# Patient Record
Sex: Male | Born: 1945 | Race: White | Hispanic: No | Marital: Single | Smoking: Never smoker
Health system: Southern US, Community
[De-identification: ages and names within clinical notes are randomized; demographics above are authoritative.]

## PROBLEM LIST (undated history)

## (undated) DIAGNOSIS — I2510 Atherosclerotic heart disease of native coronary artery without angina pectoris: Secondary | ICD-10-CM

## (undated) DIAGNOSIS — R42 Dizziness and giddiness: Secondary | ICD-10-CM

## (undated) DIAGNOSIS — I420 Dilated cardiomyopathy: Secondary | ICD-10-CM

## (undated) DIAGNOSIS — G4733 Obstructive sleep apnea (adult) (pediatric): Secondary | ICD-10-CM

## (undated) DIAGNOSIS — R238 Other skin changes: Secondary | ICD-10-CM

## (undated) DIAGNOSIS — R609 Edema, unspecified: Secondary | ICD-10-CM

## (undated) DIAGNOSIS — N189 Chronic kidney disease, unspecified: Secondary | ICD-10-CM

## (undated) DIAGNOSIS — J189 Pneumonia, unspecified organism: Secondary | ICD-10-CM

## (undated) DIAGNOSIS — I472 Ventricular tachycardia, unspecified: Secondary | ICD-10-CM

## (undated) DIAGNOSIS — E039 Hypothyroidism, unspecified: Secondary | ICD-10-CM

## (undated) DIAGNOSIS — R0602 Shortness of breath: Secondary | ICD-10-CM

## (undated) DIAGNOSIS — M1712 Unilateral primary osteoarthritis, left knee: Secondary | ICD-10-CM

## (undated) DIAGNOSIS — I4891 Unspecified atrial fibrillation: Secondary | ICD-10-CM

## (undated) DIAGNOSIS — R233 Spontaneous ecchymoses: Secondary | ICD-10-CM

## (undated) DIAGNOSIS — I255 Ischemic cardiomyopathy: Secondary | ICD-10-CM

## (undated) DIAGNOSIS — H269 Unspecified cataract: Secondary | ICD-10-CM

## (undated) DIAGNOSIS — R3915 Urgency of urination: Secondary | ICD-10-CM

## (undated) DIAGNOSIS — K5900 Constipation, unspecified: Secondary | ICD-10-CM

## (undated) DIAGNOSIS — I219 Acute myocardial infarction, unspecified: Secondary | ICD-10-CM

## (undated) DIAGNOSIS — I10 Essential (primary) hypertension: Secondary | ICD-10-CM

## (undated) DIAGNOSIS — Q600 Renal agenesis, unilateral: Secondary | ICD-10-CM

## (undated) DIAGNOSIS — M2550 Pain in unspecified joint: Secondary | ICD-10-CM

## (undated) DIAGNOSIS — R350 Frequency of micturition: Secondary | ICD-10-CM

## (undated) DIAGNOSIS — M2540 Effusion, unspecified joint: Secondary | ICD-10-CM

## (undated) DIAGNOSIS — R600 Localized edema: Secondary | ICD-10-CM

## (undated) DIAGNOSIS — F329 Major depressive disorder, single episode, unspecified: Secondary | ICD-10-CM

## (undated) DIAGNOSIS — F32A Depression, unspecified: Secondary | ICD-10-CM

## (undated) DIAGNOSIS — K219 Gastro-esophageal reflux disease without esophagitis: Secondary | ICD-10-CM

## (undated) DIAGNOSIS — J302 Other seasonal allergic rhinitis: Secondary | ICD-10-CM

## (undated) DIAGNOSIS — R32 Unspecified urinary incontinence: Secondary | ICD-10-CM

## (undated) DIAGNOSIS — Z8639 Personal history of other endocrine, nutritional and metabolic disease: Secondary | ICD-10-CM

## (undated) DIAGNOSIS — E785 Hyperlipidemia, unspecified: Secondary | ICD-10-CM

## (undated) DIAGNOSIS — Z950 Presence of cardiac pacemaker: Secondary | ICD-10-CM

## (undated) DIAGNOSIS — I509 Heart failure, unspecified: Secondary | ICD-10-CM

## (undated) HISTORY — DX: Essential (primary) hypertension: I10

## (undated) HISTORY — DX: Pneumonia, unspecified organism: J18.9

## (undated) HISTORY — PX: MULTIPLE TOOTH EXTRACTIONS: SHX2053

## (undated) HISTORY — DX: Major depressive disorder, single episode, unspecified: F32.9

## (undated) HISTORY — PX: COLONOSCOPY: SHX174

## (undated) HISTORY — DX: Ventricular tachycardia: I47.2

## (undated) HISTORY — PX: KNEE ARTHROSCOPY: SHX127

## (undated) HISTORY — DX: Unspecified atrial fibrillation: I48.91

## (undated) HISTORY — DX: Dilated cardiomyopathy: I42.0

## (undated) HISTORY — DX: Obstructive sleep apnea (adult) (pediatric): G47.33

## (undated) HISTORY — DX: Atherosclerotic heart disease of native coronary artery without angina pectoris: I25.10

## (undated) HISTORY — DX: Ventricular tachycardia, unspecified: I47.20

## (undated) HISTORY — DX: Depression, unspecified: F32.A

---

## 1974-06-08 DIAGNOSIS — N189 Chronic kidney disease, unspecified: Secondary | ICD-10-CM

## 1974-06-08 HISTORY — PX: NEPHRECTOMY: SHX65

## 1974-06-08 HISTORY — DX: Chronic kidney disease, unspecified: N18.9

## 1999-06-16 ENCOUNTER — Ambulatory Visit (HOSPITAL_COMMUNITY): Admission: RE | Admit: 1999-06-16 | Discharge: 1999-06-16 | Payer: Self-pay | Admitting: Family Medicine

## 2005-06-08 DIAGNOSIS — I219 Acute myocardial infarction, unspecified: Secondary | ICD-10-CM

## 2005-06-08 HISTORY — DX: Acute myocardial infarction, unspecified: I21.9

## 2005-06-08 HISTORY — PX: CARDIAC CATHETERIZATION: SHX172

## 2005-06-08 HISTORY — PX: CARDIAC DEFIBRILLATOR PLACEMENT: SHX171

## 2005-11-09 ENCOUNTER — Inpatient Hospital Stay (HOSPITAL_COMMUNITY): Admission: EM | Admit: 2005-11-09 | Discharge: 2005-11-13 | Payer: Self-pay | Admitting: Emergency Medicine

## 2005-11-09 ENCOUNTER — Ambulatory Visit: Payer: Self-pay | Admitting: Emergency Medicine

## 2005-11-09 ENCOUNTER — Ambulatory Visit: Payer: Self-pay | Admitting: Internal Medicine

## 2005-11-10 ENCOUNTER — Encounter (INDEPENDENT_AMBULATORY_CARE_PROVIDER_SITE_OTHER): Payer: Self-pay | Admitting: Cardiology

## 2005-12-01 ENCOUNTER — Ambulatory Visit (HOSPITAL_BASED_OUTPATIENT_CLINIC_OR_DEPARTMENT_OTHER): Admission: RE | Admit: 2005-12-01 | Discharge: 2005-12-01 | Payer: Self-pay | Admitting: Emergency Medicine

## 2005-12-16 ENCOUNTER — Ambulatory Visit: Payer: Self-pay | Admitting: Pulmonary Disease

## 2006-03-04 ENCOUNTER — Ambulatory Visit (HOSPITAL_COMMUNITY): Admission: RE | Admit: 2006-03-04 | Discharge: 2006-03-04 | Payer: Self-pay | Admitting: Cardiology

## 2006-06-04 ENCOUNTER — Ambulatory Visit: Payer: Self-pay | Admitting: Internal Medicine

## 2006-06-05 DIAGNOSIS — IMO0002 Reserved for concepts with insufficient information to code with codable children: Secondary | ICD-10-CM

## 2006-06-05 HISTORY — DX: Reserved for concepts with insufficient information to code with codable children: IMO0002

## 2006-11-30 ENCOUNTER — Ambulatory Visit: Payer: Self-pay | Admitting: Internal Medicine

## 2007-03-17 ENCOUNTER — Ambulatory Visit: Payer: Self-pay | Admitting: Internal Medicine

## 2007-05-19 ENCOUNTER — Encounter: Admission: RE | Admit: 2007-05-19 | Discharge: 2007-05-19 | Payer: Self-pay | Admitting: Cardiology

## 2009-01-22 ENCOUNTER — Encounter: Admission: RE | Admit: 2009-01-22 | Discharge: 2009-01-22 | Payer: Self-pay | Admitting: Cardiology

## 2010-01-17 ENCOUNTER — Encounter (INDEPENDENT_AMBULATORY_CARE_PROVIDER_SITE_OTHER): Payer: Self-pay | Admitting: *Deleted

## 2010-01-24 ENCOUNTER — Encounter: Payer: Self-pay | Admitting: Internal Medicine

## 2010-01-28 ENCOUNTER — Encounter: Payer: Self-pay | Admitting: Internal Medicine

## 2010-07-08 NOTE — Letter (Signed)
Summary: Device-Delinquent Check  [facility], Main Office  [address]   [place], [place] [address]   Phone: [phone]  Fax: [phone]     January 17, 2010 MRN: [id number]   Cody Faulkner [address] [place], [place]  [address]   Dear Cody Faulkner,  According to our records, you have not had your implanted device checked in the recommended period of time.  We are unable to determine appropriate device function without checking your device on a regular basis.  Please call our office to schedule an appointment, with Dr. Graciela Husbands,  as soon as possible.  If you are having your device checked by another physician, please call us so that we may update our records.  Thank you,  Altha Harm, LPN  January 17, 2010 3:28 PM  [facility]  certified mail

## 2010-07-08 NOTE — Letter (Signed)
Summary: Dr. Georga Hacking Jr.'s Office  Dr. Georga Hacking Jr.'s Office   Imported By: Marylou Mccoy 02/20/2010 09:03:33  _____________________________________________________________________  External Attachment:    Type:   Image     Comment:   External Document

## 2010-07-11 NOTE — Cardiovascular Report (Signed)
Summary: Certified Letter Delivered  Certified Letter Delivered   Imported By: Debby Freiberg 04/08/2010 11:19:42  _____________________________________________________________________  External Attachment:    Type:   Image     Comment:   External Document

## 2010-08-24 ENCOUNTER — Emergency Department (HOSPITAL_COMMUNITY): Payer: Medicare HMO

## 2010-08-24 ENCOUNTER — Inpatient Hospital Stay (HOSPITAL_COMMUNITY)
Admission: EM | Admit: 2010-08-24 | Discharge: 2010-08-29 | DRG: 193 | Disposition: A | Discharge status: Home Health | Payer: Medicare HMO | Attending: Internal Medicine | Admitting: Internal Medicine

## 2010-08-24 DIAGNOSIS — G4733 Obstructive sleep apnea (adult) (pediatric): Secondary | ICD-10-CM | POA: Diagnosis present

## 2010-08-24 DIAGNOSIS — F3289 Other specified depressive episodes: Secondary | ICD-10-CM | POA: Diagnosis present

## 2010-08-24 DIAGNOSIS — I4891 Unspecified atrial fibrillation: Secondary | ICD-10-CM | POA: Diagnosis present

## 2010-08-24 DIAGNOSIS — I110 Hypertensive heart disease with heart failure: Secondary | ICD-10-CM | POA: Diagnosis present

## 2010-08-24 DIAGNOSIS — R0902 Hypoxemia: Secondary | ICD-10-CM | POA: Diagnosis present

## 2010-08-24 DIAGNOSIS — J189 Pneumonia, unspecified organism: Principal | ICD-10-CM | POA: Diagnosis present

## 2010-08-24 DIAGNOSIS — I509 Heart failure, unspecified: Secondary | ICD-10-CM | POA: Diagnosis present

## 2010-08-24 DIAGNOSIS — Z79899 Other long term (current) drug therapy: Secondary | ICD-10-CM

## 2010-08-24 DIAGNOSIS — Z95810 Presence of automatic (implantable) cardiac defibrillator: Secondary | ICD-10-CM

## 2010-08-24 DIAGNOSIS — J441 Chronic obstructive pulmonary disease with (acute) exacerbation: Secondary | ICD-10-CM | POA: Diagnosis present

## 2010-08-24 DIAGNOSIS — F329 Major depressive disorder, single episode, unspecified: Secondary | ICD-10-CM | POA: Diagnosis present

## 2010-08-24 DIAGNOSIS — I252 Old myocardial infarction: Secondary | ICD-10-CM

## 2010-08-24 DIAGNOSIS — I428 Other cardiomyopathies: Secondary | ICD-10-CM | POA: Diagnosis present

## 2010-08-24 DIAGNOSIS — J9600 Acute respiratory failure, unspecified whether with hypoxia or hypercapnia: Secondary | ICD-10-CM | POA: Diagnosis present

## 2010-08-24 DIAGNOSIS — I5022 Chronic systolic (congestive) heart failure: Secondary | ICD-10-CM | POA: Diagnosis present

## 2010-08-24 DIAGNOSIS — M109 Gout, unspecified: Secondary | ICD-10-CM | POA: Diagnosis present

## 2010-08-24 DIAGNOSIS — Z7901 Long term (current) use of anticoagulants: Secondary | ICD-10-CM

## 2010-08-24 DIAGNOSIS — IMO0002 Reserved for concepts with insufficient information to code with codable children: Secondary | ICD-10-CM

## 2010-08-24 DIAGNOSIS — I2510 Atherosclerotic heart disease of native coronary artery without angina pectoris: Secondary | ICD-10-CM | POA: Diagnosis present

## 2010-08-24 LAB — POCT I-STAT 3, VENOUS BLOOD GAS (G3P V)
O2 Saturation: 92 %
TCO2: 31 mmol/L (ref 0–100)
pH, Ven: 7.224 — ABNORMAL LOW (ref 7.250–7.300)

## 2010-08-24 LAB — CBC
HCT: 43.1 % (ref 39.0–52.0)
Hemoglobin: 15.1 g/dL (ref 13.0–17.0)
MCH: 30.4 pg (ref 26.0–34.0)
Platelets: 139 10*3/uL — ABNORMAL LOW (ref 150–400)
RBC: 4.97 MIL/uL (ref 4.22–5.81)
WBC: 10.8 10*3/uL — ABNORMAL HIGH (ref 4.0–10.5)

## 2010-08-24 LAB — COMPREHENSIVE METABOLIC PANEL
ALT: 30 U/L (ref 0–53)
AST: 31 U/L (ref 0–37)
Calcium: 8.6 mg/dL (ref 8.4–10.5)
GFR calc Af Amer: >60 mL/min (ref >60)
GFR calc non Af Amer: >60 mL/min (ref >60)
Glucose, Bld: 124 mg/dL — ABNORMAL HIGH (ref 70–99)
Total Bilirubin: 0.8 mg/dL (ref 0.3–1.2)

## 2010-08-24 LAB — POCT I-STAT 3, ART BLOOD GAS (G3+)
Acid-Base Excess: 1 mmol/L (ref 0.0–2.0)
Bicarbonate: 28.7 mEq/L — ABNORMAL HIGH (ref 20.0–24.0)
O2 Saturation: 98 %
TCO2: 30 mmol/L (ref 0–100)
pH, Arterial: 7.302 — ABNORMAL LOW (ref 7.350–7.450)
pO2, Arterial: 110 mmHg — ABNORMAL HIGH (ref 80.0–100.0)

## 2010-08-24 LAB — DIFFERENTIAL
Basophils Absolute: 0 10*3/uL (ref 0.0–0.1)
Eosinophils Relative: 0 % (ref 0–5)
Lymphs Abs: 1.3 10*3/uL (ref 0.7–4.0)
Monocytes Relative: 7 % (ref 3–12)
Neutro Abs: 8.8 10*3/uL — ABNORMAL HIGH (ref 1.7–7.7)

## 2010-08-24 LAB — CORTISOL: Cortisol, Plasma: 15.4 ug/dL

## 2010-08-24 LAB — POCT I-STAT, CHEM 8
BUN: 21 mg/dL (ref 6–23)
Glucose, Bld: 146 mg/dL — ABNORMAL HIGH (ref 70–99)
HCT: 45 % (ref 39.0–52.0)
Hemoglobin: 15.3 g/dL (ref 13.0–17.0)

## 2010-08-24 LAB — TSH: TSH: 1.079 u[IU]/mL (ref 0.350–4.500)

## 2010-08-24 LAB — POCT CARDIAC MARKERS: Myoglobin, poc: 67.5 ng/mL (ref 12–200)

## 2010-08-24 LAB — MAGNESIUM: Magnesium: 2.1 mg/dL (ref 1.5–2.5)

## 2010-08-25 LAB — GLUCOSE, CAPILLARY
Glucose-Capillary: 309 mg/dL — ABNORMAL HIGH (ref 70–99)
Glucose-Capillary: 163 mg/dL — ABNORMAL HIGH (ref 70–99)
Glucose-Capillary: 146 mg/dL — ABNORMAL HIGH (ref 70–99)

## 2010-08-26 LAB — CBC
MCH: 28.9 pg (ref 26.0–34.0)
MCHC: 33.6 g/dL (ref 30.0–36.0)
MCV: 86.1 fL (ref 78.0–100.0)
Platelets: 154 10*3/uL (ref 150–400)
RDW: 13.4 % (ref 11.5–15.5)

## 2010-08-26 LAB — GLUCOSE, CAPILLARY
Glucose-Capillary: 181 mg/dL — ABNORMAL HIGH (ref 70–99)
Glucose-Capillary: 144 mg/dL — ABNORMAL HIGH (ref 70–99)

## 2010-08-27 LAB — PROTIME-INR: Prothrombin Time: 23.5 seconds — ABNORMAL HIGH (ref 11.6–15.2)

## 2010-08-27 LAB — GLUCOSE, CAPILLARY
Glucose-Capillary: 126 mg/dL — ABNORMAL HIGH (ref 70–99)
Glucose-Capillary: 156 mg/dL — ABNORMAL HIGH (ref 70–99)

## 2010-08-28 LAB — BASIC METABOLIC PANEL
Calcium: 8.8 mg/dL (ref 8.4–10.5)
Chloride: 100 mEq/L (ref 96–112)
Creatinine, Ser: 1.27 mg/dL (ref 0.4–1.5)
GFR calc Af Amer: >60 mL/min (ref >60)

## 2010-08-28 LAB — EXPECTORATED SPUTUM ASSESSMENT W GRAM STAIN, RFLX TO RESP C

## 2010-08-28 LAB — CBC
MCH: 29.8 pg (ref 26.0–34.0)
MCHC: 34.1 g/dL (ref 30.0–36.0)
Platelets: 224 10*3/uL (ref 150–400)
RBC: 4.53 MIL/uL (ref 4.22–5.81)

## 2010-08-28 LAB — GLUCOSE, CAPILLARY
Glucose-Capillary: 140 mg/dL — ABNORMAL HIGH (ref 70–99)
Glucose-Capillary: 110 mg/dL — ABNORMAL HIGH (ref 70–99)

## 2010-08-28 LAB — PROTIME-INR: Prothrombin Time: 28.6 seconds — ABNORMAL HIGH (ref 11.6–15.2)

## 2010-08-28 NOTE — H&P (Signed)
NAME:  Cody Faulkner, Cody Faulkner NO.:  [id number]  MEDICAL RECORD NO.:  [id number]           PATIENT TYPE:  E  LOCATION:  [facility]                         FACILITY:  [facility]  PHYSICIAN:  Marinda Elk, M.D.DATE OF BIRTH:  [birth year]  DATE OF ADMISSION:  08/24/2010 DATE OF DISCHARGE:                             HISTORY & PHYSICAL   PRIMARY CARE DOCTOR:  Almon Register.  PULMONOLOGIST:  Leslye Peer, MD  CARDIOLOGIST:  Georga Hacking, MD  CHIEF COMPLAINT:  Shortness of breath.  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  He relates that he was coughing about 4-5 days ago.  He thought it was cold and will get over it, but it continued to get worse to the point where he was significant short of breath even at sitting down.  So, he decided to come to the ED.  He became severely short of breath this morning, so he called EMS and he was brought here. Here in the ED, he was noted to have wheezing and decreased respiratory and labored breathing.  His blood pressure was 201/177.  After put on BiPAP at 50% his saturations were 90% and his blood pressure came down to 123/73.  Before BiPAP, his gas was pH 7.25, pCO2 of 70, pO2 of 78.  ALLERGIES:  NO known drug allergies.  PAST MEDICAL HISTORY: 1. V-tach, status post cardioversion with defibrillator due to dilated     cardiomyopathy. 2. Coronary artery disease, predominantly single vessel disease. 3. Sleep apnea. 4. Hypertensive heart disease. 5. History of asthma. 6. Depression. 7. Probable gout.  DISCHARGE MEDICATIONS:  He is on: 1. Aspirin 81 mg. 2. Coreg 25 mg p.o. daily. 3. Digoxin 0.125 mg daily.  FAMILY HISTORY:  Unknown.  His mother died  when he was 15 or 32-year-old. Father unknown.  SOCIAL HISTORY:  He never been married.  His close relative is __________ who works in a Museum/gallery curator.  He has been a never smoker.  REVIEW OF SYSTEMS:  A 10-point review of systems negative, positive per HPI.  PHYSICAL EXAM:  VITAL SIGNS:  Temperature 98, pulse 79, blood pressure 201/177 after put on BiPAP 96/58.  He is breathing 30 times per minute and satting 99% on BiPAP. GENERAL:  He is a morbidly obese male lying in bed comfortably with BiPAP.  He will now able to speak in full sentences. NECK:  No JVD.  No lymphadenopathy.  Dry mucous membrane.  No bruits. CARDIOVASCULAR:  He has a regular rate and rhythm with positive S1 and S2.  No murmurs, rubs or gallops. LUNGS:  He has moderate air movement with wheezing and rhonchi bilaterally. ABDOMEN:  Positive bowel sounds, nontender, nondistended.  Soft. EXTREMITIES:  Positive pulses.  No clubbing, cyanosis or edema. SKIN:  No rashes, ulceration. NEUROLOGIC:  Alert, awake and oriented x4, coherent fluent language. III-XII are grossly intact.  Nonfocal.  LABORATORY DATA:  EKG on admission shows left axis deviation with a QTC 443, QRS of 116, no ST-segment changes and first-degree AV block.  Chest x-ray shows bibasilar airspace disease likely pneumonia.  Labs on admission gas pH 7.22, pCO2 of 70, pO2 of 78.  His sodium is 138, potassium 4.2, chloride 102, bicarb of 29, glucose of 146, BUN of 21, creatinine 1.3.  His white count is 10.8 with an ANC of 8.8, hemoglobin of 15 with MCV of 86, platelet count of 139.  First set of point of cardiac care markers is negative.  His BNP is 68.  His INR is 1.3.  His dig level is less than 0.02.  His lactic acid was 1.0.  ASSESSMENT/PLAN: 1. Community-acquired pneumonia.  The patient on BiPAP.  Start him on     Rocephin and Cipro.  Get sputum culture, blood cultures.  I will     check cortisol level.  Also start him on steroids due to his height      wheezing and history of asthma. 2. Acute hypoxic toxic respiratory failure.  Now on BiPAP, doing well.     His ABG was before to be on BiPAP.  We will check a BiPAP 1 hour     after being on ABG.  We will admit him to Step-Down Unit.  We will     Curbside Pulmonology to keep an eye.  We will start him on     steroids.  Check cortisol level now.  He has a history of asthma      whic might be contributing to respiratory failure. And that is why      he has responded to to bipap as he is now confortable. 3. Chronic diastolic heart failure, currently compensated.  We will     resume home meds once his blood pressure stable. 4. Hypertension, now it is 96/56 to 123/73.  At this time, he is will     be put n.p.o.  So, we will hold his blood pressure medications and     once he is able to breathe more comfortably and able to take     p.o.'s, we will start him on his blood pressure medications.     Marinda Elk, M.D.     AF/MEDQ  D:  08/24/2010  T:  08/24/2010  Job:  [id number]  Electronically Signed by Lambert Keto M.D. on 08/28/2010 08:43:24 AM

## 2010-08-29 ENCOUNTER — Inpatient Hospital Stay (HOSPITAL_COMMUNITY): Payer: Medicare HMO

## 2010-08-29 LAB — BASIC METABOLIC PANEL
Chloride: 99 mEq/L (ref 96–112)
Creatinine, Ser: 1.47 mg/dL (ref 0.4–1.5)
GFR calc Af Amer: 58 mL/min — ABNORMAL LOW (ref >60)
GFR calc non Af Amer: 48 mL/min — ABNORMAL LOW (ref >60)
Potassium: 4 mEq/L (ref 3.5–5.1)

## 2010-08-29 LAB — PROTIME-INR: INR: 2.94 — ABNORMAL HIGH (ref 0.00–1.49)

## 2010-08-29 LAB — BRAIN NATRIURETIC PEPTIDE: Pro B Natriuretic peptide (BNP): <30 pg/mL (ref 0.0–100.0)

## 2010-08-30 LAB — CULTURE, RESPIRATORY W GRAM STAIN: Culture: NORMAL

## 2010-08-30 LAB — CULTURE, BLOOD (ROUTINE X 2)
Culture  Setup Time: 201203181159
Culture: NO GROWTH

## 2010-09-24 NOTE — Discharge Summary (Signed)
NAMETHEADORE, BLUNCK                ACCOUNT NO.:  [id number]  MEDICAL RECORD NO.:  [id number]           PATIENT TYPE:  I  LOCATION:  2030                         FACILITY:  [facility]  PHYSICIAN:  Clydia Llano, MD       DATE OF BIRTH:  1946/02/16  DATE OF ADMISSION:  08/24/2010 DATE OF DISCHARGE:  08/29/2010                              DISCHARGE SUMMARY   PRIMARY CARE PHYSICIAN:  Kathrynn Running, MD, in [place], [place].  CARDIOLOGIST:  Georga Hacking, MD  REASON FOR ADMISSION:  Shortness of breath.  DISCHARGE DIAGNOSES: 1. Bilateral bibasilar community-acquired pneumonia. 2. Chronic obstructive pulmonary disease exacerbation. 3. Acute hypoxic respiratory failure. 4. Chronic systolic congestive heart failure. 5. Atrial fibrillation. 6. Accelerated hypertension, now controlled. 7. Obstructive sleep apnea. 8. History of ventricular tachycardia, status post defibrillator 9. Depression. 10.Gouty arthritis. 11.Coronary artery disease, predominantly single vessel disease.  DISCHARGE MEDICATIONS: 1. Amlodipine 10 mg p.o. daily. 2. Imdur 120 mg p.o. daily. 3. Avelox 400 mg take for 3 more days. 4. Potassium chloride 20 mEq p.o. daily. 5. Prednisone taper starting 40 and finish with 10 in over 8 days. 6. Lasix 40 mg p.o. b.i.d. 7. Amiodarone 200 mg p.o. daily. 8. Benazepril 40 mg p.o. daily. 9. Colchicine 0.6 mg p.o. b.i.d. 10.Coreg 25 mg p.o. b.i.d. 11.Coumadin 5 mg p.o. daily. 12.Levothroid 100 mcg p.o. daily. 13.Loratadine 10 mg p.o. daily. 14.Proventil 2 puffs every 4 hours as needed for shortness of breath.  Stop taking the following medication:  Digoxin 0.25 mg p.o. daily.  RADIOLOGY:  Chest x-ray March 18 showed bilateral basilar airspace disease, likely representing pneumonia.  A 2-D echocardiogram done on February 22, report is pending.  BRIEF HISTORY/EXAMINATION:  Mr. Lavine is 65 year old Caucasian male with past medical history of wide complex tachycardia,  status post implantable defibrillator.  The patient had dilated cardiomyopathy with ejection fraction of 30%, also sleep apnea and COPD.  The patient came in with increasing shortness of breath.  The patient states he started having cough 4-5 days ago and he thought it was called and he will get over it, but the patient continued to get worse and to the point he has significant shortness of breath even when he is sitting down.  The patient decided to come down to the emergency department, became severely short of breath, and called EMS.  They brought him to the ED. He was wheezing and decreased respiratory effort.  His blood pressure was 201/177 after put on BiPAP 50%, saturation was 90%, and his blood pressure came down to 120/73.  His ABG was pH of 7.25, pCO2 of 70, pO2 of 78. 1. Community-acquired pneumonia.  The patient was admitted to the     hospital for further evaluation.  Chest x-ray showed bilateral     bibasilar community-acquired pneumonia.  Because of this, patient     was started on antibiotics.  The patient was started on IV Rocephin     and azithromycin, which was switched later to Avelox to complete     total 7 days of IV antibiotics.  The patient is getting better and  he did not require oxygen at the time of discharge.     Bronchodilators, oxygen, mucolytics will were provided while he was     in the hospital. 2. Acute respiratory failure.  The patient was hypoxic at the time of     admission.  The patient reported that he had COPD.  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  The     patient was put on BiPAP for some time.  He refused to wear it and     he required about 3 liters of oxygen.  As he was getting better, he     was back on room air satting in the low 90s without requirement of     oxygen on the day of discharge. 3. Chronic obstructive pulmonary disease exacerbation.  As above,     oxygen, bronchodilators, mucolytics and  steroids were provided.     The patient's wheezes improved a lot at the time of discharge. 4. The patient is not on long-acting bronchodilators.  Probably, he     needs longer acting bronchodilators to help with his COPD. 5. Chronic systolic heart failure.  The patient had echocardiogram, is     pending at the time of this dictation.  Previous echocardiogram in     2007 showed ejection fraction of 35%.  The patient did have some     lower extremity edema with normal BNP of 68, but the patient is     obese and BNP is not that sensitive.  The patient was aggressively     diuresed with IV Lasix.  The patient at the time of discharge still     has +1 to +2 edema, but his lungs were much clearer.  His Lasix     dose increased from 40 once daily to twice daily.  He needs to     follow up with Dr. Donnie Aho for further adjustment of his     medications. 6. Accelerated hypertension.  The patient's blood pressure was on the     high side when he was admitted.  It was over 200/170.  After BiPAP,     the patient blood pressure dropped to almost normal.  The patient     refused to wear BiPAP nor CPAP in the hospital.  Systolic blood     pressure was consistently over 170.  Amlodipine and Imdur were     added to his blood pressure medication and it helped with the blood     pressure. 7. Atrial fibrillation, rate controlled.  The patient is on Coumadin.     Discharge INR is 2.9.  The patient follows with his primary care     physician for Coumadin adjustment. 8. Obstructive sleep apnea.  The patient had sleep study reported and     2007 to have severe obstructive sleep apnea.  CPAP should be on     final pressure of 23 cm of water.  Probably, the patient needs     followup with titration.  The patient is refusing to wear the CPAP.     Maybe, his primary care physician can convince him doing that.  DISCHARGE INSTRUCTIONS:  Activity as tolerated.  Discharge diet: Regular, heart-healthy diet.  Activity as  tolerated.  Disposition to home with home health service.     Clydia Llano, MD     ME/MEDQ  D:  08/29/2010  T:  08/30/2010  Job:  [id number]  cc:  Georga Hacking, M.D. Lianne Bushy, M.D.  Electronically Signed by Clydia Llano  on 09/24/2010 03:37:26 PM

## 2010-10-21 NOTE — Letter (Signed)
November 30, 2006    W. Viann Fish, M.D.  [address], [address]  [place], [place] [address]   RE:  TRAE, BOVENZI  MRN:  [id number]  /  DOB:  1946-03-05   Dear Dr. Donnie Aho:   [name] comes in.  He has nonischemic heart disease and a prior ICD  implanted for primary prevention.   He has moderate shortness of breath.  This has not particularly changed.   His medications are reviewed.  1. Benazepril 40.  2. Amiodarone 200.  3. Aspirin.  4. Potassium.  5. Warfarin.  6. Carvedilol 6.25.  7. Lanoxin 0.25.   EXAMINATION:  His blood pressure is elevated at 191/110, rechecked at  152/100.  His pulse is 72.  LUNGS:  Clear.  HEART:  Sounds are regular.  EXTREMITIES:  Without edema.   Interrogation of his Medtronic ICD demonstrates a P wave of 5.2 with  impedance of 376, with threshold of 0.5 at 0.4 with an R wave of 6.9  with an impedance of 460 and a threshold of 0.5 at 0.4.  Proximal coil  impedance is 51, distal coil impedance was 74, battery voltage is 3.13.  There are no intercurrent episodes.  He is ventricular paced about 5% of  the time and atrial paced about 60-70% of the time, and has 1 prolonged  episode of atrial fibrillation and has atrial fibrillation about 2% of  the time, but there is Far-field over-sensing, so this is likely  overestimated.   IMPRESSION:  1. Nonischemic cardiomyopathy.  2. Status post implantable cardioverter defibrillator for primary      prevention.  3. Paroxysmal atrial fibrillation.  4. Hypertension.  5. Obesity.  6. Obstructive sleep apnea.  7. Amiodarone therapy for the above.   Karleen Hampshire, Mr. Ohern is doing okay from a defibrillator point of view.  We  have reprogrammed his device to hopefully decrease Far-field oversensing  of his atrium and decrease his atrial fibrillation counters.   I have asked him to get his blood pressure checked at the local [facility] and  bring those numbers to you.  I suspect that he has significant  hypertension that is inadequately controlled, but he was a little bit  aggravated by having gotten lost on his way here today, so it is hard to  know.  I also asked him to ask you about his carvedilol dose.  I suspect  it is at this level because of his COPD.  He says he has declined sleep  apnea evaluation because he cannot afford the therapy.   If there is anything we can do in the interim, please do not hesitate to  contact us.  Otherwise, we will plan to see him again in 1-year's time  on the anniversary of his implant.   Thank you for allowing [place] to participate in his care.    Sincerely,      Duke Salvia, MD, [facility]  Electronically Signed    SCK/MedQ  DD: 11/30/2006  DT: 11/30/2006  Job #: [id number]   CC:    Lianne Bushy, M.D.

## 2010-10-24 NOTE — Op Note (Signed)
NAMEARISTOTLE, LIEB                ACCOUNT NO.:  [id number]   MEDICAL RECORD NO.:  [id number]          PATIENT TYPE:  OIB   LOCATION:  2899                         FACILITY:  [facility]   PHYSICIAN:  Georga Hacking, M.D.DATE OF BIRTH:  [birth year]   DATE OF PROCEDURE:  03/04/2006  DATE OF DISCHARGE:  03/04/2006                                 OPERATIVE REPORT   PROCEDURE:  Elective direct current cardioversion.   HISTORY:  The patient has previous atrial fibrillation and has been on  chronic Coumadin therapy for several months with therapeutic INR.  Cardioversion is done to restore sinus rhythm to help with congestive heart  failure,.   COMMENTS ABOUT PROCEDURE:  The procedure was done in the short stay center.  Anesthesia was given by Dr. Sampson Goon with 175 mg of Pentothal.  The  defibrillator was deactivated prior to the procedure and was reprogrammed  following the procedure.  Cardioversion was done with synchronized biphasic  defibrillation with 120 watts, resulting in reversion to an AV paced rhythm,  which was confirmed to be normal sinus rhythm following this.   IMPRESSION:  Successful cardioversion of atrial fibrillation.      Georga Hacking, M.D.  Electronically Signed     WST/MEDQ  D:  03/04/2006  T:  03/06/2006  Job:  [id number]   cc:   Duke Salvia, MD, [facility]  Lianne Bushy, M.D.

## 2010-10-24 NOTE — Letter (Signed)
June 04, 2006    W. Viann Fish, M.D.  [address]  [place], [place] [address]   RE:  EH, SESAY  MRN:  [id number]  /  DOB:  [birth year]   Dear Karleen Hampshire:   Mr. Minner comes in, he is 6 months post ICD implantation for wide  complex tachycardia in the setting of nonischemic heart disease.  He is  doing well.  He says his breathing is better since he underwent  cardioversion in September.   He has a 69/49 lead as you know.   PHYSICAL EXAMINATION:  On examination today his blood pressure was  164/95.  His pulse was 74.  LUNGS:  Clear.  HEART:  Sounds were regular.  EXTREMITIES:  Were without edema.   MEDICATIONS:  Notable for his Carvedilol dose to be 6.25.   Interrogation of his Medtronic EnTrust Device demonstrates a P-wave of  3.9 with impedance of 400, a threshold of 0.5 or 0.4.  The R-wave was  8.2 with  impedance of 456 and threshold of 1 volt at 0.4.  The device  was reprogrammed for maximum longevity, and also as per the 69/49  protocol recommendations.   IMPRESSION:  1. Wide complex tachycardia.  2. Nonischemic heart disease.  3. Status post ICD for the above.  4. Paroxysmal atrial fibrillation, status post cardioversion on      amiodarone.  5. Hypertension.  6. Obesity.   Mr. Robey, Massmann, is doing well from a device point of view.  As his  device implant was in June, I have set him up for September of 2008,  with the anticipation of seeing him, then, in June of 2009 to get him  back on his annual schedule.  If we can be of help in any other way,  please do not hesitate to contact me.    Sincerely,      Duke Salvia, MD, [facility]  Electronically Signed    SCK/MedQ  DD: 06/04/2006  DT: 06/04/2006  Job #: [id number]

## 2010-10-24 NOTE — Consult Note (Signed)
Cody Faulkner, Cody Faulkner                ACCOUNT NO.:  [id number]   MEDICAL RECORD NO.:  [id number]          PATIENT TYPE:  INP   LOCATION:  2919                         FACILITY:  [facility]   PHYSICIAN:  Doylene Canning. Ladona Ridgel, M.D.  DATE OF BIRTH:  1945/12/13   DATE OF CONSULTATION:  11/10/2005  DATE OF DISCHARGE:                                   CONSULTATION   CONSULTING PHYSICIAN:  Doylene Canning. Ladona Ridgel, M.D.   REQUESTING PHYSICIAN:  Georga Hacking, M.D.   INDICATION FOR CONSULTATION:  Evaluation of hemodynamically unstable wide  QRS tachycardia consistent with ventricular tachycardia.   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  He was in his usual state of health when he  developed severe palpitations and chest pain.  He subsequently went to seek  medical attention.  He was found to be in a wide QRS tachycardia, over 250  beats per minute.  He was treated with lidocaine, amiodarone, and Versed and  underwent 100 joules cardioverting shock back to atrial fibrillation with a  rapid ventricular response.  He was placed on Cardizem and admitted for  evaluation.  He underwent catheterization today which demonstrated  nonobstructive coronary disease and LV dysfunction with an EF of 30-35%.  A  2-D echo, which was also a difficult quality study, showed an EF of 35%.  The patient earlier today developed pauses on Cardizem of greater than 3  seconds.  He is referred for additional evaluation.  The patient denies  frank syncope but felt like he would nearly pass out when he has episode of  VT.  This was also associated with chest pain.  Chest pain was non-radiating  and associated with diaphoresis and weakness.   ADDITIONAL PAST MEDICAL HISTORY:  1.  Hypertension.  2.  Obesity.  3.  COPD.   SOCIAL HISTORY:  The patient lives alone and works on a chicken farm.   He  has never married.  He denies tobacco or ethanol use.   FAMILY HISTORY:  Notable for mother died when he was 1-6 years of age.  He  does not know his father's family history.   REVIEW OF SYSTEMS:  Is notable for occasional chills but no night sweats or  fever.  He denies headache, vision, or hearing problems, skin rashes or  lesions.  He does have chest pain, shortness of breath, dyspnea, and  orthopnea.  He has mild peripheral edema.  He denies claudication, cough, or  hemoptysis.  He denies dysuria, hematuria.  He does have nocturia x3.  He  has generalized weakness but no numbness, depression, or anxiety and he  denies nausea, vomiting, diarrhea, constipation, polyuria, or polydipsia.  He does have reflux symptoms, however.  Rather, he denies polyuria, polydipsia, heat or cold intolerances.  He  denies arthralgias.  The rest of review of systems was negative.   PHYSICAL EXAMINATION:  GENERAL:  He is a pleasant, well-appearing, obese, 1-  year-old man in  no acute distress.  VITAL SIGNS:  Blood pressure was 134/82.  The pulse was 95 and irregular  regular.  Respirations was 16.  Temperature was 98.8.  HEENT:  Normocephalic and atraumatic.  Pupils are equal and round.  The  oropharynx was moist.  Sclerae anicteric.  NECK:  Revealed no jugular distension.  No thyromegaly.  Trachea is midline.  Carotids are 2+ and symmetric.  It is difficult to assess his jugular  distension secondary to his massive obesity.  LUNGS:  Revealed scattered rales in the bases but no wheezes or rhonchi.  CARDIOVASCULAR:  Revealed an irregular regular rhythm with normal S1-S2.  EXTREMITIES:  Demonstrated trace peripheral edema bilaterally.  ABDOMEN:  Obese, nontender, nondistended.  There is no organomegaly.  NEUROLOGIC:  He was alert and oriented  x3 with cranial nerves intact.  The  strength was 5 out of 5 and symmetric.   EKG demonstrates atrial fibrillation with a right bundle branch block, QRS   morphology with a QRS duration of 104 msec.  The EKG during tachycardia  demonstrates a left bundle branch, left axis, left inferior axis, question  left superior axis configuration with two separate QRS morphologies  consistent with bidirectional VT.   IMPRESSION:  1.  Nonischemic cardiomyopathy.  2.  Ventricular tachycardia.  3.  Chronic obstructive pulmonary disease.  4.  Obesity.  5.  Hypertension  6.  History of phentermine use of questionable significance.   DISCUSSION:  I have discussed treatment options with [name] in detail.  He has no significant obstructive coronary disease.  ICD is indicated for  secondary prevention of malignant ventricular arrhythmias which have been  hemodynamically unstable.  Ultimately, he will need the pursuance of sinus  rhythm.           ______________________________  Doylene Canning. Ladona Ridgel, M.D.     GWT/MEDQ  D:  11/10/2005  T:  11/11/2005  Job:  [id number]   cc:   Lianne Bushy, M.D.  Fax: [phone]

## 2010-10-24 NOTE — Procedures (Signed)
NAME:  GEMAYEL, MASCIO NO.:  [id number]   MEDICAL RECORD NO.:  [id number]          PATIENT TYPE:  OUT   LOCATION:  SLEEP CENTER                 FACILITY:  [facility]   PHYSICIAN:  Marcelyn Bruins, M.D. [facility] DATE OF BIRTH:  Dec 20, 1945   DATE OF STUDY:  12/01/2005                              NOCTURNAL POLYSOMNOGRAM   REFERRING PHYSICIAN:  Leslye Peer, M.D.   INDICATIONS FOR STUDY:  Hypersomnia with sleep apnea.   EPWORTH SCORE:  Is 5   SLEEP ARCHITECTURE:  The patient had total sleep time of 369 minutes with  very little slow wave sleep and REM.  Sleep onset latency was very rapid at  4.5 minutes, and REM onset was prolonged at 250 minutes.  Sleep efficiency  was decreased at 75%.   RESPIRATORY DATE:  The patient underwent split night study where he was  found to have 138 obstructive events in the first 127 minutes of sleep.  This gave him an extrapolated respiratory disturbance index of 65 events per  hour.  The events were not positional, and there was moderate snoring noted  throughout.  By protocol, the patient was then placed on CPAP with a large  Ultram Mirage full-face mask and underwent incremental increase and CPAP  pressure in order to control obstructive events.  The patient was titrated  as high as 23 cm of water but continued to have persistent breakthrough  events.  The difficulty was the patient constantly adjusting his CPAP mask  on his face that resulted in leaks.   OXYGEN DATA:  There was O2 desaturation as low as 73% with the patient's  obstructive events.   CARDIAC DATA:  The patient was found to have atrial fibrillation with a  controlled ventricular response at times as well as short-lived runs of wide  complex rhythm that was uncharacterized.  There was also occasional PVC.   MOVEMENT SLASH PARASOMNIA:  There were no significant leg jerks or abnormal  behaviors noted.   IMPRESSION AND RECOMMENDATIONS:  1.  Severe obstructive sleep  apnea/hypopnea syndrome with a respiratory      disturbance index of 65 events per hour noted in the first half of the      night.  There was O2 desaturation as low as 73%.  The patient was then      placed on CPAP with a large Ultram Mirage full-face mask and ultimately      titrated to a final pressure of 23 cm of water with persistent      breakthrough events.  The problem here may actually be mask fitting      issues, that resulted in leaking during titration.  At this point in      time, I would recommend refitting the patient with a better full-face      mask and gradually increasing his CPAP pressure according to tolerance,      up to a level of approximately 16-18      cm.  We can then see what kind of clinical response the patient may      have.  He may need to get back to the  sleep lab for followup titration      after he has been appropriately fitted.           ______________________________  Marcelyn Bruins, M.D. [facility]  Diplomate, American Board of Sleep  Medicine     KC/MEDQ  D:  12/16/2005 12:25:13  T:  12/16/2005 15:03:41  Job:  [id number]

## 2010-10-24 NOTE — Discharge Summary (Signed)
NAME:  Cody Faulkner, Cody Faulkner                ACCOUNT NO.:  [id number]   MEDICAL RECORD NO.:  [id number]          PATIENT TYPE:  INP   LOCATION:  3734                         FACILITY:  [facility]   PHYSICIAN:  Georga Hacking, M.D.DATE OF BIRTH:  04-29-1946   DATE OF ADMISSION:  11/09/2005  DATE OF DISCHARGE:  11/13/2005                                 DISCHARGE SUMMARY   FINAL DIAGNOSIS:  1.  Ventricular tachycardia.      1.  Cardioversion.      2.  Implantation of implantable defibrillator.      3.  Thought due to cardiomyopathy.  2.  Dilated cardiomyopathy.  3.  Coronary artery disease, predominantly single-vessel.  The      cardiomyopathy is out of proportion to the amount of coronary disease.  4.  Sleep apnea with desaturation and hypopnea during sleep observed.  5.  Hypertensive heart disease.  6.  Possible history of asthma.  7.  History of depression.  8.  Probable acute gout.  9.  Severe morbid obesity.  10. Atrial fibrillation of undetermined origin.  11. Chronic Coumadin anticoagulation.   PROCEDURES:  1.  Cardiac catheterization.  2.  2-D echocardiogram.  3.  Implantation of implantable defibrillator.   CONSULTATIONS:  Dr. Solon Augusta and Dr. Lewayne Bunting.   HISTORY:  This 65 year old male is taken care by Dr. Kathrynn Running in  [place].  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  He has been working in a Information systems manager.  He is obese and while picking up eggs in a chicken house the morning  of admission had the onset of chest discomfort.  Went to Dr. Harlene Salts office  where he was found to be in a rapid wide-complex tachycardia.  EMS was  called and he was treated initially with lidocaine and amiodarone and had  cardioversion resulting in reversion to atrial fibrillation.  Had a slower  response in the wide complex rhythm.  He presented to the [facility]  Emergency Room, was mildly hypotensive on admission and given  fluids.  Atrial fibrillation rate was somewhat high and he was admitted for further  evaluation.  Please see the previously dictated history and physical for  remainder of the details.   HOSPITAL COURSE:  His blood gas on room air shows a pH of 7.35, pCO2 of 42,  and a pO2 of 62.  CBC was normal.  PT and PTT were unremarkable.  Chemistry  panel showed a BUN of 49 and a creatinine of 1.4, the next day was 15 and  1.1.  There is mild elevation of AST and ALT.  CPK-MB was 7.7 and troponin  of 0.23.  Troponin rose to 0.34 thought due to the cardioversion.  Lipid  panel shows a cholesterol of 120, triglycerides of 20, LDL cholesterol of  76.  TSH was 1.253 and T4 was 8.7.  B-natriuretic peptide level was 220.   The patient was initially placed on diltiazem and intravenous heparin.  He  had no additional ventricular tachycardia.  He had a  cardiac catheterization  done the next day that showed a 60% stenosis in the circumflex.  There was  not much disease in the LAD or the right coronary artery.  Left ventricular  function had global hypokinesis with an ejection fraction about 35%.  An  echocardiogram was technically difficult, but also showed an ejection  fraction around 35%.  It was thought in light of the wide complex  tachycardia he would need an EP consult and this was done and recommended an  implantable defibrillator.  He also seen by Dr. Solon Augusta of the pulmonary  consultant who thought that he had obesity and questioned whether he had  asthma or not.  His pulmonary medications were discontinued.  He was  observed to have desaturation as low as 83% and apnea and hypopneic  consistent with obstructive sleep apnea.  He was placed on nasal oxygen at  night.  He did have some slight pauses noted and his Diltiazem was stopped.  On November 11, 2005 he had an implantable defibrillator with a Medtronic EnTrust  D154 device.  The Medtronic lead serial number was [id number] on the atrial  lead and the  ventricular lead was a Medtronic [id number], serial number  [id number].  In testing his defibrillation thresholds which were somewhat  high, he converted to sinus rhythm and Dr. Graciela Husbands put him back into atrial  fibrillation with rapid atrial pacemaking.  He was observed for the next  couple of days and had no recurrence of ventricular tachycardia and his  wound site was stable.  He was not noted to have any wheezing, but had acute  arthritis in his left knee on November 12, 2005.  This was treated with a single  shot of prednisone which resulted in resolution of this making it clinically  suspicious for gout.  He was initiated into therapy with beta blocker and  continued on ACE inhibitors and was loaded with Lanoxin.  He was seen  intensively by the discharge planning nurses as he does not insurance.  He  is supposed to have a sleep study.  He evidently does not qualify for oxygen  at night and we will defer this to Dr. Solon Augusta.  He was given nutritional  education and is recommended to be discharged today depending Dr. Gailen Shelter  recommendations.  He also will consider CPAP at that night for sleep apnea.  His discharge condition is improved.   DISCHARGE MEDICATIONS:  1.  Aspirin 81 mg daily.  2.  Warfarin 5 mg daily with Coumadin to be checked by Dr. Harlene Salts office.  3.  Lotrel 5/20 mg daily.  4.  Coreg CR 20 mg daily.  5.  Aspirin 81 mg daily.  6.  Digoxin 0.25 mg daily.   It is recommended he follow up in one week to see me and call Dr. Purnell Shoemaker for  an appointment to get a pro time on Monday.      Georga Hacking, M.D.  Electronically Signed     WST/MEDQ  D:  11/13/2005  T:  11/13/2005  Job:  [id number]   cc:   Doylene Canning. Ladona Ridgel, M.D.  [address]  [address]  [place]  [place] [address]   Lianne Bushy, M.D.  Fax: [phone]   Leslye Peer, M.D.

## 2010-10-24 NOTE — Cardiovascular Report (Signed)
NAME:  Cody Faulkner, Cody Faulkner NO.:  [id number]   MEDICAL RECORD NO.:  [id number]          PATIENT TYPE:  INP   LOCATION:  2919                         FACILITY:  [facility]   PHYSICIAN:  Georga Hacking, M.D.DATE OF BIRTH:  1946-02-25   DATE OF PROCEDURE:  11/10/2005  DATE OF DISCHARGE:                              CARDIAC CATHETERIZATION   PROCEDURE:  Left heart catheterization with coronary angiogram and left  ventriculogram.   The patient tolerated the procedure well without complications.  Following  the procedure, he had good hemostasis and peripheral pulses noted.  The  procedure was done with a single anterior needle wall stick with 6-French  catheter.   INDICATIONS:  Wide complex tachycardia, mild elevation of cardiac enzymes.   HEMODYNAMIC DATA:  The aorta postcontrast 112/77, LV postcontrast 112/16-20.   ANGIOGRAPHIC DATA:  Left ventriculogram:  Performed in the 30-degree RAO  projection.  Aortic valve is normal.  The mitral valve is normal.  The left  ventricle has increased trabeculations noted.  There appears to be global  hypokinesis with an estimated ejection fraction of 30% although there is  some under opacification of the ventricle.  Coronary arteries arise and  distribute normally.  No significant calcification is noted.  The left main  coronary artery is normal.   Left anterior descending:  Large vessel that extends to the apex and has two  diagonal branches.  There is mild 30% stenosis of the origin of the first  diagonal.  Scattered irregularities are noted in the LAD.   Circumflex coronary artery:  Large vessel.  There is a 60-70% stenosis prior  to the origin of a trifurcation with three marginal branches noted at that  site.  The third marginal branch has a 60% stenosis at its origin.   Right coronary artery:  Dominant vessel with posterior descending and  posterolateral branch.  Scattered irregularities are noted, and no  significant  stenoses are noted.   IMPRESSION:  1.  Single-vessel disease with moderate severity involving the circumflex      coronary artery which is out of proportion to the degree of left      ventricular dysfunction.  2.  Global hypokinesis with left ventricular dysfunction compatible with      cardiomyopathy.   RECOMMENDATIONS:  Electrophysiology consult.  Initiate beta blocker therapy.  Confirm ejection fraction with echocardiogram.      Georga Hacking, M.D.  Electronically Signed     WST/MEDQ  D:  11/10/2005  T:  11/10/2005  Job:  [id number]   cc:   Lianne Bushy, M.D.  Fax: [phone]   Doylene Canning. Ladona Ridgel, M.D.  [address]  [address]  [place]  [place] [address]

## 2010-10-24 NOTE — Op Note (Signed)
NAME:  ADRIC, WREDE NO.:  [id number]   MEDICAL RECORD NO.:  [id number]          PATIENT TYPE:  INP   LOCATION:  2919                         FACILITY:  [facility]   PHYSICIAN:  Duke Salvia, M.D.  DATE OF BIRTH:  [birth year]   DATE OF PROCEDURE:  11/11/2005  DATE OF DISCHARGE:                                 OPERATIVE REPORT   PREOPERATIVE DIAGNOSIS:  Wide complex tachycardia in the setting of  nonischemic cardiomyopathy, persistence in rapid atrial fibrillation.   POSTOPERATIVE DIAGNOSIS:  Wide complex tachycardia in the setting of  nonischemic cardiomyopathy, persistence in rapid atrial fibrillation.   PROCEDURE:  Dual chamber device implantation with intraoperative  defibrillation threshold testing and rapid atrial pacing.   Following obtaining informed consent, the patient was brought to the  electrophysiology laboratory and placed on the fluoroscopic table in the  supine position.  After routine prep and drape in the left upper chest,  lidocaine was infiltrated in the prepectoral and subclavicular region.  An  incision was made and carried down to the layer of the prepectoral fascia,  using electrocautery and sharp dissection.  A pocket was formed similarly.  Hemostasis was obtained.   Thereafter, attention was turned to gain access to the extrathoracic left  subclavian vein, which was accomplished without difficulty except for one  arterial venipuncture.  Pressure was held for 2 minutes.  Two successfully  venipunctures were accomplished thereafter, and a 0 silk suture was placed  in a figure-of-eight fashion and allowed to hang loosely.  Sequentially, 7-  [place] tear-away introducer sheaths was placed through which was passed a  Medtronic 69/49, 65 cm, __________  dual coil defibrillator leads, serial  number [id number], and a Medtronic 50/76, 52 cm __________  atrial lead,  serial number [id number].  Under fluoroscopic guidance, these were  manipulated to right ventricular apex and the right atrial free wall where  the bipolar fibrillation wave was 0.5-1 mV with a pacing impedance of 601  ohms.  The RV amplitude was 12 mV with a pacing impedance of 1161 ohms, a  threshold of 0.9 volts at 0.5 msec, current threshold was 1.3, and there was  no diaphragmatic pacing at 10 volts.   With these acceptable parameters recorded, the leads were then secured to a  Medtronic EnTrust [id number] pulse generator, serial number [id number].  The  bipolar fibrillation wave was 0.5 with a pace impedance of 504 ohms.  Threshold was not assessed.  The bipolar R-wave was 8.2 mV with a pace  impedance of 728 ohms with a threshold of 1 volt at 0.3 msec.  I should note  that the current of injury with both leads was adequate.  The proximal coil  impedance was 54 ohms, the distal coil impedance was 44 ohms.   At this point defibrillation threshold testing was undertaken.  Ventricular  fibrillation was induced through the T-wave shock.  After a total duration  of 5 seconds, a 15 joule shock was delivered through an unmeasured  resistance, failing to terminate ventricular fibrillation.  After a total  duration of 14 seconds, a 25  joule shock was delivered through measured  resistance of 42 ohms, terminating ventricular fibrillation and restoring  sinus rhythm.   After a wait of 5-6 minutes, ventricular fibrillation was re-induced through  the T-wave shock.  After a total duration of 8.3 seconds, a 25 joule shock  was delivered through a measured resistance of 44 ohms, terminating  ventricular fibrillation and restoring sinus rhythm.  At this point the  device was implanted.  The pocket was copiously irrigated with antibiotic  containing saline solution.  Hemostasis was assured.  The leads and the  pulse generator were placed in the pocket, secured to the prepectoral  fascia, and the wound was closed in 3 layers in normal fashion.  The wound  was washed,  dried, and a Benzoin and Steri-Strips dressing was applied.  Needle counts, sponge counts and instrument counts were correct at the end  of the procedure, according to the staff.  The patient tolerated the  procedure without apparent complication.   At this point atrial fibrillation was re-induced.  This was because the  patient had atrial fibrillation of unknown duration, and was not going to be  anticoagulated adequately.  Rapid atrial pacing at 200 msec successfully  terminated sinus rhythm and re-induced atrial fibrillation.   Further management of his atrial fibrillation will be, in part, we will  pursue rate control at this juncture and then subsequent conversion per Dr.  Donnie Aho.           ______________________________  Duke Salvia, M.D.     SCK/MEDQ  D:  11/11/2005  T:  11/11/2005  Job:  [id number]   cc:   Electrophysiology Laboratory

## 2010-10-24 NOTE — H&P (Signed)
Cody Faulkner, Cody Faulkner NO.:  [id number]   MEDICAL RECORD NO.:  [id number]          PATIENT TYPE:  INP   LOCATION:  2919                         FACILITY:  [facility]   PHYSICIAN:  Georga Hacking, M.D.DATE OF BIRTH:  1946/04/12   DATE OF ADMISSION:  11/09/2005  DATE OF DISCHARGE:                                HISTORY & PHYSICAL   REASON FOR ADMISSION:  Chest pain and arrhythmia.   HISTORY:  The patient is a 65 year old male, who previously has been taken  of by Dr. Lianne Bushy, in [place].  He has a history of obesity and has a  solitary kidney following nephrectomy for a nonfunctioning kidney.  He was  laid off of his previous job and has been working in a Museum/gallery curator.  He is  obese and has been treated with phentermine by Dr. Purnell Shoemaker since 2004 for  weight loss and also has a history of asthma and hypertension.  He reports  having been told of a possible heart attack several years ago, but the  details around this are vague.  While at work picking eggs up in a chicken  house this morning, he had the onset of chest discomfort and went to Dr.  Harlene Salts office where he was found to be in a rapid, wide, complex  tachycardia.  EMS was called, and he was treated initially with lidocaine  and amiodarone and later had cardioversion which resulted in reversion to  atrial fibrillation at a slower response from the wide complex rhythm.  The  patient presented to the [facility] Emergency Room and was mildly  hypotensive on admission, was given fluids.  His atrial fibrillation rate  was somewhat high, and I was contacted.  His chest pain has since resolved,  and a follow up EKG showed atrial fibrillation but no acute changes.  Initial point of care enzymes were unremarkable.  The patient is obese and  has no previous history of myocardial complaints.   PAST MEDICAL HISTORY:  1.  His past medical history is remarkable for hypertension.  2.  Asthma.  3.  COPD.  4.  Previous  history of depression.   PAST SURGICAL HISTORY:  Knee surgery and has also had a left nephrectomy and  has previously had a left nephrectomy.   ALLERGIES:  None.   CURRENT MEDICATIONS:  1.  Allergra twice daily.  2.  Singulair 10 mg daily.  3.  Advair discus once daily.  4.  Albuterol p.r.n.  5.  Aspirin 81 mg daily.  6.  Celebrex 200 b.i.d.  7.  HCTZ 12.5 mg daily.  8.  Lotrel 10/20 mg daily.  9.  Phentermine daily.   FAMILY HISTORY:  His father's health is unknown.  His mother died when he  was 67-35 years old.   SOCIAL HISTORY:  He is single and never been married.  His closest relative  is a cousin who is with him and works in the [employer] with him.  He is  a nonsmoker and does not use alcohol to excess.   REVIEW OF SYSTEMS:  He  is obese and normally has no difficulty doing his  normal activities.  He has no eye problems.  He has no difficulty hearing,  no difficulty swallowing, or nose bleeds.  She has occasional dyspepsia and  indigestion.  No constipation or diarrhea.  Denies any dysuria, urgency,  frequency, or kidney stones.  Does have a solitary kidney.  Has significant  generalized arthritis.  He has significant asthma and has been treated with  inhalers as well as Singulair in the past.  There is no history of TIA or  stroke.  No history of endocrine problems.   PHYSICAL EXAMINATION:  GENERAL:  The patient is an obese male, who speaks  with a husky voice and is somnolent.  VITAL SIGNS:  Blood pressure is currently 110/70.  Pulse is current 130 and  irregular.  SKIN:  Warm and dry.  ENT:  He has a balding male hair pattern.  EOMI.  PERRLA.  C&S clear.  Fundi  not examined.  Pharynx is negative, somewhat large tongue and lips.  NECK:  Supple without masses, JVD, thyromegaly, or bruits.  LUNGS:  Decreased breath sounds bilaterally.  CARDIOVASCULAR:  Normal S1 and S2.  There is no S3, no S4, and no murmur.  ABDOMEN:  Soft and nontender.  No mass,  hepatosplenomegaly, or aneurysm.  EXTREMITIES:  There is trace edema noted.  Distal pulses were 2+.   12-lead EKG shows atrial fibrillation with rapid response.  Initial  laboratory data shows normal CBC.  Point of care enzymes were unremarkable.  CBC is normal.   IMPRESSION:  1.  Wide complex tachycardia degenerated into atrial fibrillation.  This      could have been ventricular tachycardia associated with chest pain or      could have been atrial flutter with aberrant conduction in 1:1 response.      The patient does not have evidence of preexcitation on EKG and is      currently in atrial fibrillation.  2.  Chest pain, rule out myocardial infarction.  3.  Morbid obesity.  4.  History of asthma.  5.  History of depression.  6.  Hypertension.   RECOMMENDATIONS:  Very difficult to assess and to evaluate.  We will place  on heparin, check echocardiogram and thyroid function tests, begin  intravenous Cardizem to control rate.  Obtain further laboratory testing  follow up following diagnostic testing.      Georga Hacking, M.D.  Electronically Signed    WST/MEDQ  D:  11/09/2005  T:  11/09/2005  Job:  [id number]   cc:   Lianne Bushy, M.D.  Fax: [phone]

## 2010-10-24 NOTE — Consult Note (Signed)
Cody Faulkner, Cody Faulkner NO.:  [id number]   MEDICAL RECORD NO.:  [id number]          PATIENT TYPE:  INP   LOCATION:  2919                         FACILITY:  [facility]   PHYSICIAN:  Leslye Peer, M.D.  DATE OF BIRTH:  May 11, 1946   DATE OF CONSULTATION:  11/10/2005  DATE OF DISCHARGE:                                   CONSULTATION   REASON FOR CONSULTATION:  We were asked by Dr. Viann Fish with  cardiology to evaluate Mr. Esses for suspected obstructive sleep apnea and  possible asthma.   BRIEF HISTORY:  Cody Faulkner is a 65 year old man who has carried a history of  asthma for many years which has been treated with Advair. He is currently  using the Advair only once daily. He also has a history of hypertension.  Finally he has been treated with chronic Phentermine since 2004 for weight  loss. He was at work on the morning of November 09, 2005, had the onset of chest  discomfort and was taken to see Dr. Purnell Shoemaker who found him to be in a wide  rapid complex tachycardia. He was urgently brought the [facility] : [facility].  In route, he received lidocaine and amiodarone and was cardioverted. At  [facility], his rhythm revealed atrial fibrillation. He is a never smoker  but complains of dyspnea on exertion even at his baseline. He also snores  and may have had witnessed apneas. He has significant daytime sleepiness and  naps when he can. We are consulted regarding his possible airflow limitation  and his expected sleep apnea.   PAST MEDICAL HISTORY:  1.  Hypertension.  2.  Possible asthma.  3.  Depression.  4.  Obesity on Phentermine since 2004.  5.  Status post nephrectomy for nonfunctioning kidney. His other kidney      apparently functions appropriately.  6.  Osteoarthritis status post knee surgery.   ALLERGIES:  No known drug allergies.   HOME MEDICATIONS:  1.  Allegra 180 mg b.i.d.  2.  Singulair 10 mg daily.  3.  Advair 1 inhalation daily.  4.  Albuterol 2 puffs q.  4 h p.r.n.  5.  Aspirin 81 mg daily.  6.  Celebrex 200 mg b.i.d.  7.  HCTZ 12.5 daily.  8.  Lotrel 10/20 mg daily.  9.  Phentermine daily.   FAMILY HISTORY:  His father's health is unknown. His mother died when he was  a youth.   SOCIAL HISTORY:  The patient is single and never been married. 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 He is a never smoker  and does not use alcohol.   REVIEW OF SYSTEMS:  He is able to do his usual activities without shortness  of breath. He does complain of some upper airway noise and secretions with  cough. He occasionally has stridor with exertion. He snores and has had  witnessed apneas. He has significant generalized arthritis.   PHYSICAL EXAMINATION:  VITAL SIGNS:  Temperature afebrile. Blood pressure  130/77, heart rate 72 and irregular. Respiratory rate 14. SPO2  is 97% on 2  liters per minute by nasal cannula.  GENERAL:  This is a morbidly obese gentleman who is in no distress lying  supine in his bed.  HEENT:  He has a very course wet quality to his voice. He has no stridor.  His pupils are equal, round and reactive to light.  NECK:  Without lymphadenopathy.  LUNGS:  Distant but clear to auscultation bilaterally. There are no wheezes  under forced expiration.  HEART:  Irregularly irregular without murmur.  ABDOMEN:  Soft, nontender with positive bowel sounds.  EXTREMITIES:  Have 1+ lower extremity pitting edema.  NEUROLOGIC:  He is alert and oriented x3 and has a grossly nonfocal exam.   LABORATORY DATA:  ABG on November 09, 2005 was 7.39, 40, 65, 24 on room air. CBC  on June 5 was white blood cell count of 7.2, hematocrit 37.4, platelets 195.  Chemistries, sodium 136, potassium 3.8, chloride 106, CO2 25, BUN 15,  creatinine 1.1, glucose is 118. His troponins peaked at 0.34 with a peak CK-  MB of 7.7. His BNP is 98. Chest x-ray is somewhat limited due to the  patient's body habitus with some mild interstitial  prominence that seems to  be most consistent with atelectasis. There are no overt infiltrates.   IMPRESSION:  1.  Wide complex ventricular tachycardia with cardioversion to atrial      fibrillation now hemodynamically stable.  2.  High suspicion for obstructive sleep apnea.  3.  History of upper airway disease and dyspnea that may be consistent with      asthma. Other possibilities included vocal cord dysfunction and upper      airway irritation due to post nasal drip. We must also consider other      possibilities including interstitial lung disease secondary to his bird      exposure in the chicken houses.   RECOMMENDATIONS:  1.  I would propose an overnight oximetry tonight if he desaturates then he      likely needs to be placed on empiric CPAP therapy. He will need an      formal polysomnogram as an outpatient.  2.  I would perform full pulmonary function testing as an outpatient. In the      meantime, I would like to perform spirometry. I discussed this with Dr.      Gilman Schmidt and he believes that it should be safe for the patient to      go      to spirometry as long as he is accompanied by a nurse with a      defibrillator. Spirometry will help [place] determine whether he has true      asthma, upper airway disease or whether he could potentially have      another etiology for his dyspnea such as hypersensitivity pneumonitis      due to bird exposure.           ______________________________  Leslye Peer, M.D.     RSB/MEDQ  D:  11/11/2005  T:  11/12/2005  Job:  [id number]   cc:   Georga Hacking, M.D.  Fax: [phone]  Email: [email or web address]   Lianne Bushy, M.D.  Fax: [phone]

## 2010-12-04 ENCOUNTER — Other Ambulatory Visit: Payer: Self-pay | Admitting: Cardiology

## 2010-12-04 ENCOUNTER — Ambulatory Visit
Admission: RE | Admit: 2010-12-04 | Discharge: 2010-12-04 | Disposition: A | Discharge status: Home / Self Care | Payer: Medicare HMO | Source: Ambulatory Visit | Attending: Cardiology | Admitting: Cardiology

## 2010-12-04 DIAGNOSIS — Z7901 Long term (current) use of anticoagulants: Secondary | ICD-10-CM

## 2011-05-12 ENCOUNTER — Telehealth: Payer: Self-pay | Admitting: Internal Medicine

## 2011-05-12 NOTE — Telephone Encounter (Signed)
I attempted to call Bonita Quin- this was a number for [employer]. The office is closed. I left a message on the patient's home number to call.

## 2011-05-12 NOTE — Telephone Encounter (Signed)
New problem:  C/O bruising around defib site.

## 2011-05-14 NOTE — Telephone Encounter (Signed)
I left a message for the patient to call. 

## 2011-05-19 NOTE — Telephone Encounter (Signed)
I left a message again for the patient. I advised if he is still having a problem, to please call our office back. I will close this encounter since I have not heard back from him.

## 2011-05-20 ENCOUNTER — Encounter: Payer: Self-pay | Admitting: *Deleted

## 2011-05-20 ENCOUNTER — Telehealth: Payer: Self-pay | Admitting: Internal Medicine

## 2011-05-20 NOTE — Telephone Encounter (Signed)
I spoke with the patient to follow up on bruising that was reported by his PCP around his device site. Per the patient, this has been going on for about 2-3 weeks. He states that it does not bother him. He does not remember hitting it on anything. He is not clear if there is any redness around the area. He states that the only thing that really touches the area is the seatbelt. He has an appointment to come in tomorrow to see Dr. Graciela Husbands. He will keep this visit.

## 2011-05-20 NOTE — Telephone Encounter (Signed)
Fu call Pt said he received a call yesterday from a nurse Please call him back

## 2011-05-21 ENCOUNTER — Ambulatory Visit (INDEPENDENT_AMBULATORY_CARE_PROVIDER_SITE_OTHER): Payer: Medicare HMO | Admitting: *Deleted

## 2011-05-21 ENCOUNTER — Encounter: Payer: Self-pay | Admitting: Internal Medicine

## 2011-05-21 ENCOUNTER — Ambulatory Visit (INDEPENDENT_AMBULATORY_CARE_PROVIDER_SITE_OTHER): Payer: Medicare HMO | Admitting: Internal Medicine

## 2011-05-21 DIAGNOSIS — I420 Dilated cardiomyopathy: Secondary | ICD-10-CM

## 2011-05-21 DIAGNOSIS — I480 Paroxysmal atrial fibrillation: Secondary | ICD-10-CM | POA: Insufficient documentation

## 2011-05-21 DIAGNOSIS — Z79899 Other long term (current) drug therapy: Secondary | ICD-10-CM

## 2011-05-21 DIAGNOSIS — T82198A Other mechanical complication of other cardiac electronic device, initial encounter: Secondary | ICD-10-CM

## 2011-05-21 DIAGNOSIS — T148XXA Other injury of unspecified body region, initial encounter: Secondary | ICD-10-CM

## 2011-05-21 DIAGNOSIS — I5022 Chronic systolic (congestive) heart failure: Secondary | ICD-10-CM

## 2011-05-21 DIAGNOSIS — I472 Ventricular tachycardia, unspecified: Secondary | ICD-10-CM

## 2011-05-21 DIAGNOSIS — I428 Other cardiomyopathies: Secondary | ICD-10-CM

## 2011-05-21 DIAGNOSIS — Z95810 Presence of automatic (implantable) cardiac defibrillator: Secondary | ICD-10-CM

## 2011-05-21 DIAGNOSIS — I5042 Chronic combined systolic (congestive) and diastolic (congestive) heart failure: Secondary | ICD-10-CM | POA: Insufficient documentation

## 2011-05-21 DIAGNOSIS — I4891 Unspecified atrial fibrillation: Secondary | ICD-10-CM

## 2011-05-21 LAB — ICD DEVICE OBSERVATION
AL THRESHOLD: 1 V
BAMS-0001: 170 {beats}/min
BATTERY VOLTAGE: 2.64 V
CHARGE TIME: 11.701 s
DEV-0020ICD: NEGATIVE
FVT: 0
PACEART VT: 0
TOT-0002: 2
TZAT-0001ATACH: 1
TZAT-0001FASTVT: 1
TZAT-0002ATACH: NEGATIVE
TZAT-0004FASTVT: 8
TZAT-0004SLOWVT: 8
TZAT-0004SLOWVT: 8
TZAT-0005FASTVT: 88 pct
TZAT-0011FASTVT: 10 ms
TZAT-0011SLOWVT: 10 ms
TZAT-0011SLOWVT: 10 ms
TZAT-0012ATACH: 150 ms
TZAT-0012ATACH: 150 ms
TZAT-0012FASTVT: 200 ms
TZAT-0012SLOWVT: 200 ms
TZAT-0012SLOWVT: 200 ms
TZAT-0013FASTVT: 1
TZAT-0013SLOWVT: 2
TZAT-0013SLOWVT: 2
TZAT-0018ATACH: NEGATIVE
TZAT-0018FASTVT: NEGATIVE
TZAT-0019ATACH: 6 V
TZAT-0019SLOWVT: 8 V
TZAT-0020ATACH: 1.5 ms
TZAT-0020ATACH: 1.5 ms
TZAT-0020ATACH: 1.5 ms
TZAT-0020SLOWVT: 1.5 ms
TZON-0003ATACH: 350 ms
TZON-0003FASTVT: 240 ms
TZON-0003SLOWVT: 350 ms
TZON-0004SLOWVT: 16
TZST-0001ATACH: 4
TZST-0001FASTVT: 2
TZST-0001FASTVT: 3
TZST-0001FASTVT: 5
TZST-0001SLOWVT: 4
TZST-0001SLOWVT: 6
TZST-0002ATACH: NEGATIVE
TZST-0002ATACH: NEGATIVE
TZST-0003FASTVT: 35 J
TZST-0003FASTVT: 35 J
TZST-0003SLOWVT: 35 J
TZST-0003SLOWVT: 35 J
TZST-0003SLOWVT: 35 J
VENTRICULAR PACING ICD: 1.2 pct

## 2011-05-21 NOTE — Assessment & Plan Note (Signed)
Stable  Is he candidate for aldactone

## 2011-05-21 NOTE — Assessment & Plan Note (Signed)
The patient has a diagnosis over his ICD. There is no erythema or warmth. I do not think this represents infection. He has other ecchymosis. I wonder whether we should stop his aspirin as it is known to increase the risks of bleeding but no added benefit to Warfarin. We will also need to check his INR today.

## 2011-05-21 NOTE — Assessment & Plan Note (Signed)
LIA is active and SIC count is 0    Pt unable to hear tones

## 2011-05-21 NOTE — Patient Instructions (Signed)
Your physician recommends that you schedule a follow-up appointment in:  WITH DR Donnie Aho AS SCHEDULED    Your physician has recommended you make the following change in your medication: HOLD ASPIRIN UNTIL SEES  DR Donnie Aho Your physician recommends that you return for lab work in: TODAY  INR  DX BRUISING

## 2011-05-21 NOTE — Assessment & Plan Note (Signed)
The patient's device was interrogated.  The information was reviewed. No changes were made in the programming.    

## 2011-05-21 NOTE — Assessment & Plan Note (Signed)
Intermittent AF;  Continue coumadin  Hold ASA until sees Dr Donnie Aho next week for long term decision

## 2011-05-21 NOTE — Progress Notes (Signed)
HPI  Cody Faulkner is a 65 y.o. male Implanted with an ICD in 2007 for wide-complex tachycardia in the setting of nonischemic cardiomyopathy. We have not seen him since 2008. He called last week with concerns about bruising over his ICD site. He is not aware of any specific trauma.  He notes ecchymoses on his arms.  He denies fevers or chills.  He is moderate exercise intolerance. He is short of breath at less than 100 yards. He has orthopnea and some peripheral edema.  He sees Dr. Donnie Aho   2-3 times a year. He has been sent certified letters on our behalf in the past.  Past Medical History  Diagnosis Date  . Pneumonia, community acquired     bilateral bibasilar   . COPD exacerbation   . Acute respiratory failure with hypoxia   . Atrial fibrillation   . Hypertension   . OSA (obstructive sleep apnea)   . Depression   . Gouty arthritis   . CAD (coronary artery disease)     predominantly single vessel  . Ventricular tachycardia     s/p defibrillator-Medtronic EnTrust [id number]  . Dilated cardiomyopathy     s/p defibrillator Medtronic EnTrust [phone]  . Asthma     Past Surgical History  Procedure Date  . Cardiac defibrillator placement 2007    Medtronic EnTrust [phone]  . Nephrectomy 1976    Right  . Arthroscopic knee surgery   . Cardiac catheterization 2007    60% Stenosis mid-CFX    Current Outpatient Prescriptions  Medication Sig Dispense Refill  . albuterol (PROVENTIL HFA;VENTOLIN HFA) 108 (90 BASE) MCG/ACT inhaler Inhale 2 puffs into the lungs every 6 (six) hours as needed.        Marland Kitchen allopurinol (ZYLOPRIM) 300 MG tablet Take 300 mg by mouth daily.        Marland Kitchen amiodarone (PACERONE) 200 MG tablet Take 200 mg by mouth daily.        Marland Kitchen aspirin 81 MG tablet Take 81 mg by mouth daily.        . benazepril (LOTENSIN) 40 MG tablet Take 40 mg by mouth daily.        . budesonide-formoterol (SYMBICORT) 160-4.5 MCG/ACT inhaler Inhale 2 puffs into the lungs 2 (two) times daily.        .  carvedilol (COREG) 25 MG tablet Take 25 mg by mouth 2 (two) times daily with a meal.        . fenofibrate micronized (LOFIBRA) 134 MG capsule Take 134 mg by mouth daily before breakfast.        . Fluticasone-Salmeterol (ADVAIR DISKUS) 250-50 MCG/DOSE AEPB Inhale 1 puff into the lungs every 12 (twelve) hours.        . furosemide (LASIX) 40 MG tablet Take 40 mg by mouth daily.        Marland Kitchen levothyroxine (SYNTHROID, LEVOTHROID) 100 MCG tablet Take 100 mcg by mouth daily.        Marland Kitchen loratadine (CLARITIN) 10 MG tablet Take 10 mg by mouth daily.        . potassium chloride SA (K-DUR,KLOR-CON) 20 MEQ tablet Take 20 mEq by mouth daily.        Marland Kitchen warfarin (COUMADIN) 5 MG tablet Take 5 mg by mouth daily.         Family History  Problem Relation Age of Onset  . Heart disease Other     uncle  . Diabetes Other     uncle      No Known Allergies  Review of Systems negative except from HPI and PMH  Physical Exam Well developed and well nourished Morbidly  Obese male in no acute distress HENT normal x edentulous E scleral and icterus clear Neck Supple  carotids brisk and full Clear to ausculation but breath sounds are decreased Regular rate and rhythm, no murmurs gallops or rub Soft with active bowel sounds No clubbing cyanosis Trace Edema Alert and oriented, grossly normal motor and sensory function Skin Warm and Dry  ECG demonstrates sinus rhythm at 57 with intervals of 0.22/0.10 5.39 Axis is left at -82  Assessment and  Plan

## 2011-05-21 NOTE — Progress Notes (Signed)
icd check in clinic (seeing dr Graciela Husbands)

## 2011-05-21 NOTE — Assessment & Plan Note (Signed)
Stable

## 2011-05-21 NOTE — Assessment & Plan Note (Signed)
No intercurrent VT 

## 2011-06-05 ENCOUNTER — Ambulatory Visit
Admission: RE | Admit: 2011-06-05 | Discharge: 2011-06-05 | Disposition: A | Discharge status: Home / Self Care | Payer: Medicare HMO | Source: Ambulatory Visit | Attending: Cardiology | Admitting: Cardiology

## 2011-06-05 ENCOUNTER — Other Ambulatory Visit: Payer: Self-pay | Admitting: Cardiology

## 2011-06-05 DIAGNOSIS — R0789 Other chest pain: Secondary | ICD-10-CM

## 2011-06-06 ENCOUNTER — Other Ambulatory Visit: Payer: Self-pay

## 2011-06-06 ENCOUNTER — Emergency Department (HOSPITAL_COMMUNITY): Payer: Medicare Other

## 2011-06-06 ENCOUNTER — Inpatient Hospital Stay (HOSPITAL_COMMUNITY): Payer: Medicare Other

## 2011-06-06 ENCOUNTER — Inpatient Hospital Stay (HOSPITAL_COMMUNITY)
Admission: EM | Admit: 2011-06-06 | Discharge: 2011-06-19 | DRG: 871 | Disposition: A | Discharge status: Skilled Nursing Facility | Payer: Medicare Other | Attending: Family Medicine | Admitting: Family Medicine

## 2011-06-06 DIAGNOSIS — I480 Paroxysmal atrial fibrillation: Secondary | ICD-10-CM | POA: Insufficient documentation

## 2011-06-06 DIAGNOSIS — Z905 Acquired absence of kidney: Secondary | ICD-10-CM

## 2011-06-06 DIAGNOSIS — E875 Hyperkalemia: Secondary | ICD-10-CM

## 2011-06-06 DIAGNOSIS — I5022 Chronic systolic (congestive) heart failure: Secondary | ICD-10-CM | POA: Diagnosis present

## 2011-06-06 DIAGNOSIS — J9692 Respiratory failure, unspecified with hypercapnia: Secondary | ICD-10-CM

## 2011-06-06 DIAGNOSIS — E039 Hypothyroidism, unspecified: Secondary | ICD-10-CM | POA: Insufficient documentation

## 2011-06-06 DIAGNOSIS — I420 Dilated cardiomyopathy: Secondary | ICD-10-CM | POA: Insufficient documentation

## 2011-06-06 DIAGNOSIS — J4489 Other specified chronic obstructive pulmonary disease: Secondary | ICD-10-CM | POA: Diagnosis present

## 2011-06-06 DIAGNOSIS — Z23 Encounter for immunization: Secondary | ICD-10-CM

## 2011-06-06 DIAGNOSIS — Z6841 Body Mass Index (BMI) 40.0 and over, adult: Secondary | ICD-10-CM

## 2011-06-06 DIAGNOSIS — F3289 Other specified depressive episodes: Secondary | ICD-10-CM | POA: Diagnosis present

## 2011-06-06 DIAGNOSIS — J189 Pneumonia, unspecified organism: Secondary | ICD-10-CM | POA: Diagnosis present

## 2011-06-06 DIAGNOSIS — I472 Ventricular tachycardia, unspecified: Secondary | ICD-10-CM | POA: Diagnosis present

## 2011-06-06 DIAGNOSIS — J9600 Acute respiratory failure, unspecified whether with hypoxia or hypercapnia: Secondary | ICD-10-CM | POA: Diagnosis present

## 2011-06-06 DIAGNOSIS — R0902 Hypoxemia: Secondary | ICD-10-CM

## 2011-06-06 DIAGNOSIS — F329 Major depressive disorder, single episode, unspecified: Secondary | ICD-10-CM | POA: Diagnosis present

## 2011-06-06 DIAGNOSIS — J4530 Mild persistent asthma, uncomplicated: Secondary | ICD-10-CM | POA: Insufficient documentation

## 2011-06-06 DIAGNOSIS — D6832 Hemorrhagic disorder due to extrinsic circulating anticoagulants: Secondary | ICD-10-CM | POA: Diagnosis present

## 2011-06-06 DIAGNOSIS — G9340 Encephalopathy, unspecified: Secondary | ICD-10-CM | POA: Diagnosis present

## 2011-06-06 DIAGNOSIS — M109 Gout, unspecified: Secondary | ICD-10-CM | POA: Diagnosis present

## 2011-06-06 DIAGNOSIS — I509 Heart failure, unspecified: Secondary | ICD-10-CM | POA: Diagnosis present

## 2011-06-06 DIAGNOSIS — Z7901 Long term (current) use of anticoagulants: Secondary | ICD-10-CM

## 2011-06-06 DIAGNOSIS — I2510 Atherosclerotic heart disease of native coronary artery without angina pectoris: Secondary | ICD-10-CM | POA: Diagnosis present

## 2011-06-06 DIAGNOSIS — R4182 Altered mental status, unspecified: Secondary | ICD-10-CM

## 2011-06-06 DIAGNOSIS — Z79899 Other long term (current) drug therapy: Secondary | ICD-10-CM

## 2011-06-06 DIAGNOSIS — I4891 Unspecified atrial fibrillation: Secondary | ICD-10-CM | POA: Diagnosis present

## 2011-06-06 DIAGNOSIS — G4733 Obstructive sleep apnea (adult) (pediatric): Secondary | ICD-10-CM | POA: Diagnosis present

## 2011-06-06 DIAGNOSIS — I5032 Chronic diastolic (congestive) heart failure: Secondary | ICD-10-CM | POA: Insufficient documentation

## 2011-06-06 DIAGNOSIS — I4729 Other ventricular tachycardia: Secondary | ICD-10-CM | POA: Diagnosis present

## 2011-06-06 DIAGNOSIS — N170 Acute kidney failure with tubular necrosis: Secondary | ICD-10-CM | POA: Diagnosis present

## 2011-06-06 DIAGNOSIS — E872 Acidosis: Secondary | ICD-10-CM

## 2011-06-06 DIAGNOSIS — A419 Sepsis, unspecified organism: Principal | ICD-10-CM | POA: Diagnosis present

## 2011-06-06 DIAGNOSIS — N179 Acute kidney failure, unspecified: Secondary | ICD-10-CM

## 2011-06-06 DIAGNOSIS — I10 Essential (primary) hypertension: Secondary | ICD-10-CM | POA: Diagnosis present

## 2011-06-06 DIAGNOSIS — Z7982 Long term (current) use of aspirin: Secondary | ICD-10-CM

## 2011-06-06 DIAGNOSIS — I428 Other cardiomyopathies: Secondary | ICD-10-CM | POA: Diagnosis present

## 2011-06-06 DIAGNOSIS — N320 Bladder-neck obstruction: Secondary | ICD-10-CM | POA: Diagnosis present

## 2011-06-06 DIAGNOSIS — I5042 Chronic combined systolic (congestive) and diastolic (congestive) heart failure: Secondary | ICD-10-CM | POA: Insufficient documentation

## 2011-06-06 DIAGNOSIS — J449 Chronic obstructive pulmonary disease, unspecified: Secondary | ICD-10-CM | POA: Diagnosis present

## 2011-06-06 DIAGNOSIS — Z95810 Presence of automatic (implantable) cardiac defibrillator: Secondary | ICD-10-CM

## 2011-06-06 DIAGNOSIS — R0689 Other abnormalities of breathing: Secondary | ICD-10-CM | POA: Diagnosis present

## 2011-06-06 DIAGNOSIS — R6520 Severe sepsis without septic shock: Secondary | ICD-10-CM | POA: Diagnosis present

## 2011-06-06 DIAGNOSIS — R791 Abnormal coagulation profile: Secondary | ICD-10-CM | POA: Diagnosis not present

## 2011-06-06 DIAGNOSIS — J690 Pneumonitis due to inhalation of food and vomit: Secondary | ICD-10-CM

## 2011-06-06 HISTORY — DX: Renal agenesis, unilateral: Q60.0

## 2011-06-06 HISTORY — DX: Hypothyroidism, unspecified: E03.9

## 2011-06-06 LAB — GLUCOSE, CAPILLARY
Glucose-Capillary: 156 mg/dL — ABNORMAL HIGH (ref 70–99)
Glucose-Capillary: 126 mg/dL — ABNORMAL HIGH (ref 70–99)
Glucose-Capillary: 74 mg/dL (ref 70–99)

## 2011-06-06 LAB — CBC
HCT: 41.6 % (ref 39.0–52.0)
Hemoglobin: 14.6 g/dL (ref 13.0–17.0)
MCHC: 35.3 g/dL (ref 30.0–36.0)
MCHC: 35.1 g/dL (ref 30.0–36.0)
MCV: 85.2 fL (ref 78.0–100.0)
MCV: 84.2 fL (ref 78.0–100.0)
Platelets: 295 10*3/uL (ref 150–400)
RBC: 5.6 MIL/uL (ref 4.22–5.81)
RDW: 13.5 % (ref 11.5–15.5)
RDW: 13.6 % (ref 11.5–15.5)
WBC: 14.9 10*3/uL — ABNORMAL HIGH (ref 4.0–10.5)

## 2011-06-06 LAB — INFLUENZA PANEL BY PCR (TYPE A & B)
Influenza A By PCR: NEGATIVE
Influenza B By PCR: NEGATIVE

## 2011-06-06 LAB — URINALYSIS, ROUTINE W REFLEX MICROSCOPIC
Leukocytes, UA: NEGATIVE
Nitrite: NEGATIVE
Protein, ur: NEGATIVE mg/dL
Specific Gravity, Urine: 1.017 (ref 1.005–1.030)
Urobilinogen, UA: 1 mg/dL (ref 0.0–1.0)

## 2011-06-06 LAB — POCT I-STAT 3, ART BLOOD GAS (G3+)
Acid-base deficit: 3 mmol/L — ABNORMAL HIGH (ref 0.0–2.0)
Acid-base deficit: 1 mmol/L (ref 0.0–2.0)
Bicarbonate: 30.8 mEq/L — ABNORMAL HIGH (ref 20.0–24.0)
Bicarbonate: 26.1 mEq/L — ABNORMAL HIGH (ref 20.0–24.0)
O2 Saturation: 94 %
Patient temperature: 97.4
TCO2: 34 mmol/L (ref 0–100)
pH, Arterial: 7.319 — ABNORMAL LOW (ref 7.350–7.450)
pO2, Arterial: 216 mmHg — ABNORMAL HIGH (ref 80.0–100.0)

## 2011-06-06 LAB — COMPREHENSIVE METABOLIC PANEL
AST: 14 U/L (ref 0–37)
Albumin: 3.1 g/dL — ABNORMAL LOW (ref 3.5–5.2)
Chloride: 95 mEq/L — ABNORMAL LOW (ref 96–112)
Creatinine, Ser: 1.04 mg/dL (ref 0.50–1.35)
Potassium: 4.7 mEq/L (ref 3.5–5.1)
Total Bilirubin: 1.1 mg/dL (ref 0.3–1.2)
Total Protein: 6 g/dL (ref 6.0–8.3)

## 2011-06-06 LAB — PRO B NATRIURETIC PEPTIDE: Pro B Natriuretic peptide (BNP): 1205 pg/mL — ABNORMAL HIGH (ref 0–125)

## 2011-06-06 LAB — DIFFERENTIAL
Basophils Absolute: 0 10*3/uL (ref 0.0–0.1)
Basophils Relative: 0 % (ref 0–1)
Eosinophils Absolute: 0 10*3/uL (ref 0.0–0.7)
Monocytes Absolute: 1.2 10*3/uL — ABNORMAL HIGH (ref 0.1–1.0)
Neutro Abs: 10.7 10*3/uL — ABNORMAL HIGH (ref 1.7–7.7)
Neutrophils Relative %: 82 % — ABNORMAL HIGH (ref 43–77)

## 2011-06-06 LAB — PROTIME-INR
INR: 6.09 (ref 0.00–1.49)
Prothrombin Time: 55 seconds — ABNORMAL HIGH (ref 11.6–15.2)

## 2011-06-06 LAB — MRSA PCR SCREENING: MRSA by PCR: NEGATIVE

## 2011-06-06 LAB — POCT I-STAT, CHEM 8
BUN: 25 mg/dL — ABNORMAL HIGH (ref 6–23)
Creatinine, Ser: 1.1 mg/dL (ref 0.50–1.35)
Glucose, Bld: 150 mg/dL — ABNORMAL HIGH (ref 70–99)
Hemoglobin: 18.4 g/dL — ABNORMAL HIGH (ref 13.0–17.0)
TCO2: 30 mmol/L (ref 0–100)

## 2011-06-06 LAB — URINE MICROSCOPIC-ADD ON

## 2011-06-06 LAB — LACTIC ACID, PLASMA: Lactic Acid, Venous: 0.6 mmol/L (ref 0.5–2.2)

## 2011-06-06 LAB — CARDIAC PANEL(CRET KIN+CKTOT+MB+TROPI)
CK, MB: 3.7 ng/mL (ref 0.3–4.0)
Total CK: 32 U/L (ref 7–232)
Troponin I: <0.3 ng/mL (ref <0.30)

## 2011-06-06 MED ORDER — MIDAZOLAM HCL 2 MG/2ML IJ SOLN
INTRAMUSCULAR | Status: AC
  Administered 2011-06-06: 15:00:00 via INTRAVENOUS
  Filled 2011-06-06: qty 2

## 2011-06-06 MED ORDER — OSELTAMIVIR PHOSPHATE 6 MG/ML PO SUSR
75.0000 mg | Freq: Two times a day (BID) | ORAL | Status: DC
  Administered 2011-06-06 – 2011-06-07 (×2): 75 mg
  Filled 2011-06-06 (×5): qty 12.5

## 2011-06-06 MED ORDER — DEXTROSE 5 % IV SOLN
2.0000 g | INTRAVENOUS | Status: DC
  Administered 2011-06-06 – 2011-06-07 (×2): 2 g via INTRAVENOUS
  Filled 2011-06-06 (×3): qty 2

## 2011-06-06 MED ORDER — FENTANYL CITRATE 0.05 MG/ML IJ SOLN
INTRAMUSCULAR | Status: AC
  Administered 2011-06-06: 15:00:00 via INTRAVENOUS
  Filled 2011-06-06: qty 2

## 2011-06-06 MED ORDER — SODIUM CHLORIDE 0.9 % IV BOLUS (SEPSIS)
1000.0000 mL | Freq: Once | INTRAVENOUS | Status: AC
  Administered 2011-06-06: 1000 mL via INTRAVENOUS

## 2011-06-06 MED ORDER — NOREPINEPHRINE BITARTRATE 1 MG/ML IJ SOLN
2.0000 ug/min | INTRAVENOUS | Status: DC
  Administered 2011-06-06: 20 ug/min via INTRAVENOUS
  Administered 2011-06-07: 5 ug/min via INTRAVENOUS
  Filled 2011-06-06 (×2): qty 4

## 2011-06-06 MED ORDER — SODIUM CHLORIDE 0.9 % IV SOLN
25.0000 ug/h | INTRAVENOUS | Status: DC
  Administered 2011-06-06: 25 ug/h via INTRAVENOUS
  Filled 2011-06-06 (×2): qty 50

## 2011-06-06 MED ORDER — CHLORHEXIDINE GLUCONATE 0.12 % MT SOLN
15.0000 mL | Freq: Two times a day (BID) | OROMUCOSAL | Status: DC
  Administered 2011-06-07 – 2011-06-19 (×23): 15 mL via OROMUCOSAL
  Filled 2011-06-06 (×27): qty 15

## 2011-06-06 MED ORDER — BIOTENE DRY MOUTH MT LIQD
15.0000 mL | Freq: Four times a day (QID) | OROMUCOSAL | Status: DC
  Administered 2011-06-07 – 2011-06-19 (×40): 15 mL via OROMUCOSAL

## 2011-06-06 MED ORDER — NOREPINEPHRINE BITARTRATE 1 MG/ML IJ SOLN: 2.0000 ug/min | INTRAVENOUS | Status: DC

## 2011-06-06 MED ORDER — SODIUM CHLORIDE 0.9 % IV SOLN
1.0000 mg/h | INTRAVENOUS | Status: DC
  Administered 2011-06-06 – 2011-06-07 (×2): 2 mg/h via INTRAVENOUS
  Filled 2011-06-06 (×2): qty 10

## 2011-06-06 MED ORDER — LEVOFLOXACIN IN D5W 500 MG/100ML IV SOLN: 500.0000 mg | INTRAVENOUS | Status: DC

## 2011-06-06 MED ORDER — VANCOMYCIN HCL IN DEXTROSE 1-5 GM/200ML-% IV SOLN
1000.0000 mg | Freq: Two times a day (BID) | INTRAVENOUS | Status: DC
  Administered 2011-06-06: 1000 mg via INTRAVENOUS
  Filled 2011-06-06 (×2): qty 200

## 2011-06-06 MED ORDER — FUROSEMIDE 10 MG/ML IJ SOLN: 40.0000 mg | Freq: Once | INTRAMUSCULAR | Status: DC

## 2011-06-06 MED ORDER — IPRATROPIUM-ALBUTEROL 18-103 MCG/ACT IN AERO
6.0000 | INHALATION_SPRAY | RESPIRATORY_TRACT | Status: DC
  Administered 2011-06-06 – 2011-06-10 (×22): 6 via RESPIRATORY_TRACT
  Filled 2011-06-06: qty 14.7

## 2011-06-06 MED ORDER — MIDAZOLAM HCL 2 MG/2ML IJ SOLN
INTRAMUSCULAR | Status: AC
  Administered 2011-06-06: 4 mg
  Filled 2011-06-06: qty 4

## 2011-06-06 MED ORDER — ETOMIDATE 2 MG/ML IV SOLN
INTRAVENOUS | Status: AC
  Administered 2011-06-06: 20 mg via INTRAVENOUS
  Filled 2011-06-06: qty 20

## 2011-06-06 MED ORDER — PANTOPRAZOLE SODIUM 40 MG IV SOLR
40.0000 mg | INTRAVENOUS | Status: DC
  Administered 2011-06-06 – 2011-06-13 (×8): 40 mg via INTRAVENOUS
  Filled 2011-06-06 (×10): qty 40

## 2011-06-06 MED ORDER — INSULIN ASPART 100 UNIT/ML ~~LOC~~ SOLN
1.0000 [IU] | SUBCUTANEOUS | Status: DC | PRN
  Filled 2011-06-06: qty 3

## 2011-06-06 MED ORDER — SUCCINYLCHOLINE CHLORIDE 20 MG/ML IJ SOLN
INTRAMUSCULAR | Status: AC
  Filled 2011-06-06: qty 10

## 2011-06-06 MED ORDER — ROCURONIUM BROMIDE 50 MG/5ML IV SOLN
INTRAVENOUS | Status: AC
  Administered 2011-06-06: 100 mg via INTRAVENOUS
  Filled 2011-06-06: qty 2

## 2011-06-06 MED ORDER — FENTANYL CITRATE 0.05 MG/ML IJ SOLN
INTRAMUSCULAR | Status: AC
  Administered 2011-06-06: 200 ug via INTRAVENOUS
  Filled 2011-06-06: qty 4

## 2011-06-06 MED ORDER — NOREPINEPHRINE BITARTRATE 1 MG/ML IJ SOLN
INTRAMUSCULAR | Status: AC
  Filled 2011-06-06: qty 4

## 2011-06-06 MED ORDER — MOXIFLOXACIN HCL IN NACL 400 MG/250ML IV SOLN
400.0000 mg | INTRAVENOUS | Status: AC
  Administered 2011-06-06 – 2011-06-13 (×8): 400 mg via INTRAVENOUS
  Filled 2011-06-06 (×8): qty 250

## 2011-06-06 MED ORDER — ALBUTEROL SULFATE (5 MG/ML) 0.5% IN NEBU
INHALATION_SOLUTION | RESPIRATORY_TRACT | Status: AC
  Filled 2011-06-06: qty 1

## 2011-06-06 MED ORDER — SODIUM CHLORIDE 0.9 % IV SOLN
INTRAVENOUS | Status: DC
  Administered 2011-06-06 – 2011-06-11 (×2): via INTRAVENOUS
  Administered 2011-06-11: 20 mL/h via INTRAVENOUS
  Administered 2011-06-15: via INTRAVENOUS

## 2011-06-06 MED ORDER — VANCOMYCIN HCL 1000 MG IV SOLR
1500.0000 mg | Freq: Two times a day (BID) | INTRAVENOUS | Status: DC
  Administered 2011-06-07: 1500 mg via INTRAVENOUS
  Filled 2011-06-06 (×2): qty 1500

## 2011-06-06 MED ORDER — LIDOCAINE HCL (CARDIAC) 20 MG/ML IV SOLN
INTRAVENOUS | Status: AC
  Filled 2011-06-06: qty 5

## 2011-06-06 MED ORDER — DEXTROSE 5 % IV SOLN
INTRAVENOUS | Status: AC
  Filled 2011-06-06: qty 250

## 2011-06-06 MED ORDER — VANCOMYCIN HCL 1000 MG IV SOLR
1500.0000 mg | Freq: Once | INTRAVENOUS | Status: AC
  Administered 2011-06-06: 1500 mg via INTRAVENOUS
  Filled 2011-06-06: qty 1500

## 2011-06-06 NOTE — Procedures (Signed)
Central Venous Catheter Insertion Procedure Note Adonis Yim [id number] Dec 26, 1945  Procedure: Insertion of Central Venous Catheter Indications: Assessment of intravascular volume and Drug and/or fluid administration  Procedure Details Consent: Unable to obtain consent because of emergent medical necessity. Time Out: Verified patient identification, verified procedure, site/side was marked, verified correct patient position, special equipment/implants available, medications/allergies/relevent history reviewed, required imaging and test results available.  Performed  Maximum sterile technique was used including antiseptics, cap, gloves, gown, hand hygiene, mask and sheet. Skin prep: Chlorhexidine; local anesthetic administered A antimicrobial bonded/coated triple lumen catheter was placed in the right internal jugular vein using the Seldinger technique.  Evaluation Blood flow good Complications: No apparent complications Patient did tolerate procedure well. Chest X-ray ordered to verify placement.  CXR: pending.  Chonda Baney 06/06/2011, 11:54 AM

## 2011-06-06 NOTE — Procedures (Signed)
Arterial Catheter Insertion Procedure Note Gurnoor Ursua [id number] 1945/07/23  Procedure: Insertion of Arterial Catheter  Indications: Blood pressure monitoring and Frequent blood sampling  Procedure Details Consent: Unable to obtain consent because of emergent medical necessity. Time Out: Verified patient identification, verified procedure, site/side was marked, verified correct patient position, special equipment/implants available, medications/allergies/relevent history reviewed, required imaging and test results available.  Performed  Maximum sterile technique was used including antiseptics, cap, gloves, gown, hand hygiene, mask and sheet. Skin prep: Chlorhexidine; local anesthetic administered 20 gauge catheter was inserted into right radial artery using the Seldinger technique.  Evaluation Blood flow good; BP tracing good. Complications: No apparent complications.   Koren Bound 06/06/2011

## 2011-06-06 NOTE — ED Notes (Addendum)
1135 Pt very groogy. Will respond with verbal stimuli. Pt falls back to sleep very quickly and appears to be not breathing when he falls asleep. Dr. Tanja Port to intubate. All supplies at bedside RT at bedside. Meds given at 1140 and pt easily intubated with 8'0 at 22 at the lip. Breath sounds heard bilaterally. Vitals remain stable. PCCM placing central line and ART line.

## 2011-06-06 NOTE — ED Notes (Addendum)
Patient to ED at 1025 and EDP at bedside. Patient placed on monitor and ekg captures patient remained on non-rebreather with oxygen sat at 100%. Zoll pads placed on patient. Patient diaphoretic and unable to respond to questions with labored breathing with diminished lung sounds bilaterally. Marland Kitchen CBG 159, Respirtory at bedside at 1031 patient placed on Bi-pap dentures removed. Patient remains on bi-pap and monitor. Patient more responsive now skin beginning to dry and starting to shake head yes and no in answer to questions.

## 2011-06-06 NOTE — ED Notes (Signed)
Foley attempted, unsuccessful would not advance past prostate, will let pccm know

## 2011-06-06 NOTE — Progress Notes (Signed)
eLink Physician-Brief Progress Note Patient Name: Cody Faulkner DOB: [birth year] MRN: [id number]  Date of Service  06/06/2011   HPI/Events of Note     eICU Interventions  Fluid bolus for drop in bp,  chk CBC, note high INR   Intervention Category Intermediate Interventions: Hypotension - evaluation and management  ALVA,RAKESH V. 06/06/2011, 9:26 PM

## 2011-06-06 NOTE — Consult Note (Signed)
Consultation note: Cody Faulkner  Date of consultation: 06/06/2011  Consultation requested by: [facility] pulmonary/critical care.  Patient is a 65 years old male who is in intensive care unit. He is intubated. No history can be obtained from the patient. Apparently he collapsed at home today. He was found to be hypoxic on arrival. The nurses were unable to insert a Foley catheter and I was asked to see him for catheter insertion.  Past medical history: That was obtained form the chart. 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  Past surgical history: Cardiac defibrillator placement, right nephrectomy, cardiac catheterization, arthroscopic  knee surgery.   Medications: Allopurinol, albuterol, amiodarone, Lotensin, aspirin, Symbicort, carvedilol, furosemide, isosorbide, Claritin, levothyroid, Coumadin, potassium chloride,Imdur.  Social history: No tobacco use.  Family history is positive for diabetes, heart disease.  Review of systems: Patient is intubated and no history could be obtained.  On physical examination his blood pressure is 111/74, pulse 84.  He is now sedated and is intubated.  His skin is warm and dry.  His abdomen is obese. Kidneys are not palpable. There is no hepatomegaly, no splenomegaly.  There is no evidence of inguinal adenopathy.  Penis is uncircumcised. The meatus is normal. Testicles, cords, epididymis are within normal limits.   I prepped the penis with Betadine solution. Xylocaine jelly was instilled through the urethra. I then passed the flexible cystoscope in the bladder. There was edema and a blood clot at the level of the membranous urethra. I was able to pass the flexible cystoscope through that area in the bladder. I then passed a sensor wire through the cystoscope in the bladder. I then removed  the cystoscope and passed a #16 Councill catheter in the bladder. The  balloon of the catheter was then inflated with 10 cc of water. The sensor wire was then removed. Clear urine was then drained out of the bladder. The catheter was then left to straight drainage.  Suggestion: Leave Foley catheter indwelling until patient is stable.

## 2011-06-06 NOTE — ED Provider Notes (Signed)
History     CSN: [id number]  Arrival date & time 06/06/11  1020   First MD Initiated Contact with Patient 06/06/11 1029      Chief Complaint  Patient presents with  . Respiratory Distress  CC:  Respiratory Distress  Patient was brought in by EMS for respiratory difficulty.  Next stated that the patient came in from outside and collapsed into a chair. The family had told EMS that the patient was diagnosed with influenza on Monday, and has been feeling ill since then. He has had shortness of breath and generalized weakness. It is unclear, whether he's been having any chest pain. He's had no vomiting. No reported fevers. He was hypoxic in route. Patient was unable to give any other history at this time. Due to his difficulty in breathing. He does have a known history of pneumonia, COPD, obesity, atrial fibrillation, hypertension, obstructive sleep apnea, and other medical problems. Please see chart  (Consider location/radiation/quality/duration/timing/severity/associated sxs/prior treatment) HPI  Past Medical History  Diagnosis Date  . Pneumonia, community acquired     bilateral bibasilar   . COPD exacerbation   . Atrial fibrillation   . Hypertension   . OSA (obstructive sleep apnea)     declines mask  . Depression   . Gouty arthritis   . CAD (coronary artery disease)     predominantly single vessel  . Ventricular tachycardia     s/p defibrillator-Medtronic EnTrust [id number]  . Dilated cardiomyopathy     s/p defibrillator Medtronic EnTrust [phone]  . Asthma   . 7846 Lead   . Implantable Defibrillator     Medtronic Entrust  . Chronic systolic heart failure     Past Surgical History  Procedure Date  . Cardiac defibrillator placement 2007    Medtronic EnTrust [phone]  . Nephrectomy 1976    Right  . Arthroscopic knee surgery   . Cardiac catheterization 2007    60% Stenosis mid-CFX    Family History  Problem Relation Age of Onset  . Heart disease Other     uncle  . Diabetes  Other     uncle    History  Substance Use Topics  . Smoking status: Never Smoker   . Smokeless tobacco: Never Used  . Alcohol Use: Yes     light      Review of Systems  Unable to perform ROS   Allergies  Review of patient's allergies indicates no known allergies.  Home Medications   Current Outpatient Rx  Name Route Sig Dispense Refill  . ACETAMINOPHEN 500 MG PO TABS Oral Take 500 mg by mouth every 6 (six) hours as needed. For pain     . ALBUTEROL SULFATE HFA 108 (90 BASE) MCG/ACT IN AERS Inhalation Inhale 2 puffs into the lungs every 4 (four) hours as needed. For shortness of breath     . ALLOPURINOL 300 MG PO TABS Oral Take 300 mg by mouth daily.     . AMIODARONE HCL 200 MG PO TABS Oral Take 200 mg by mouth daily.      . ASPIRIN 81 MG PO CHEW Oral Chew 81 mg by mouth daily.      Marland Kitchen BENAZEPRIL HCL 40 MG PO TABS Oral Take 40 mg by mouth daily.      . BUDESONIDE-FORMOTEROL FUMARATE 160-4.5 MCG/ACT IN AERO Inhalation Inhale 2 puffs into the lungs 2 (two) times daily.      Marland Kitchen CARVEDILOL 25 MG PO TABS Oral Take 25 mg by mouth 2 (two) times  daily with a meal.      . FENOFIBRATE MICRONIZED 134 MG PO CAPS Oral Take 134 mg by mouth daily.     . FUROSEMIDE 40 MG PO TABS Oral Take 40 mg by mouth daily.     . ISOSORBIDE MONONITRATE ER 30 MG PO TB24 Oral Take 30 mg by mouth daily.      Marland Kitchen LEVOTHYROXINE SODIUM 100 MCG PO TABS Oral Take 100 mcg by mouth daily.     Marland Kitchen LORATADINE 10 MG PO TABS Oral Take 10 mg by mouth daily.      Marland Kitchen POTASSIUM CHLORIDE CRYS CR 20 MEQ PO TBCR Oral Take 20 mEq by mouth daily.     . WARFARIN SODIUM 5 MG PO TABS Oral Take 5 mg by mouth daily.       BP 115/84  Pulse 79  Resp 20  Ht 5\' 6"  (1.676 m)  SpO2 100%  Physical Exam  Nursing note and vitals reviewed. Constitutional: He is oriented to person, place, and time. He appears well-developed and well-nourished. He appears distressed.  HENT:  Head: Normocephalic and atraumatic.  Eyes: Conjunctivae and EOM are  normal. Pupils are equal, round, and reactive to light.  Neck: Neck supple.  Cardiovascular: Normal rate and regular rhythm.  Exam reveals no gallop and no friction rub.   No murmur heard. Pulmonary/Chest: He has no wheezes. He has no rales. He exhibits no tenderness.       Lungs sounds are diminished throughout. No rhonchi, or wheezing noted.  Abdominal: Soft. Bowel sounds are normal. He exhibits no distension. There is no tenderness. There is no rebound and no guarding.  Musculoskeletal: Normal range of motion.  Neurological: He is alert and oriented to person, place, and time. No cranial nerve deficit. Coordination normal.       Patient is awake and alert, but somewhat drowsy. He is moving all extremities. No facial droop. Smile is symmetric. Tongue protrudes to the midline. Grip strength is equal. Unable to examine gait at this time  Skin: Skin is warm. No rash noted. He is diaphoretic.  Psychiatric: He has a normal mood and affect.    ED Course  Procedures (including critical care time)  Labs Reviewed  CBC - Abnormal; Notable for the following:    WBC 14.9 (*)    All other components within normal limits  PRO B NATRIURETIC PEPTIDE - Abnormal; Notable for the following:    Pro B Natriuretic peptide (BNP) 1049.0 (*)    All other components within normal limits  POCT I-STAT 3, BLOOD GAS (G3+) - Abnormal; Notable for the following:    pH, Arterial 7.122 (*)    pCO2 arterial 94.4 (*)    pO2, Arterial 101.0 (*)    Bicarbonate 30.8 (*)    Acid-base deficit 3.0 (*)    All other components within normal limits  POCT I-STAT, CHEM 8 - Abnormal; Notable for the following:    Sodium 129 (*)    Chloride 95 (*)    BUN 25 (*)    Glucose, Bld 150 (*)    Hemoglobin 18.4 (*)    HCT 54.0 (*)    All other components within normal limits  GLUCOSE, CAPILLARY - Abnormal; Notable for the following:    Glucose-Capillary 156 (*)    All other components within normal limits  CARDIAC PANEL(CRET  KIN+CKTOT+MB+TROPI)  LACTIC ACID, PLASMA  POCT I-STAT TROPONIN I  I-STAT TROPONIN I  I-STAT, CHEM 8  CULTURE, BLOOD (ROUTINE X 2)  CULTURE, BLOOD (ROUTINE X 2)  I-STAT 3, BLOOD GAS (G3+)  PROTIME-INR  APTT   Dg Chest 2 View  06/05/2011  *RADIOLOGY REPORT*  Clinical Data: Chest pain and shortness of breath.  CHEST - 2 VIEW  Comparison: 12/04/2010  Findings: The patient has mild cardiomegaly with slight increased pulmonary vascular congestion and small bilateral pleural effusions as well as peribronchial thickening.  I suspect this represents mild pulmonary edema. AICD in place.  IMPRESSION: Probable mild congestive heart failure with slight interstitial edema and small effusions.  Original Report Authenticated By: Gwynn Burly, M.D.   Dg Chest Portable 1 View  06/06/2011  *RADIOLOGY REPORT*  Clinical Data: Post intubation.  Right line placement  PORTABLE CHEST - 1 VIEW  Comparison: Chest radiograph 06/06/2011 at 10:57 hours  Findings: Endotracheal tube tip is approximately 2.5 cm above the carina.  Right IJ central venous catheter terminates over the distal superior vena cava, slightly obscured by the patient's AICD leads.  Cardiomegaly and prominent mediastinal contours are stable. There is diffuse pulmonary vascular congestion and bilateral airspace disease.  Bilateral pleural effusions are suspected. Negative for pneumothorax.  IMPRESSION:  1.  Negative for pneumothorax. 2.  Right IJ central venous catheter terminates in the distal superior vena cava. 3.  Endotracheal tube tip is 2.5 cm above the carina. 4.  No change in probable congestive heart failure.  Original Report Authenticated By: Britta Mccreedy, M.D.   Dg Chest Portable 1 View  06/06/2011  *RADIOLOGY REPORT*  Clinical Data: Shortness of breath.  PORTABLE CHEST - 1 VIEW  Comparison: 06/05/2011 and 11/26/2010.  Findings: 1057 hours.  Current examination is limited by the low lung volumes, motion and overlap of the patient's mandible  with the lung apices.  The left subclavian AICD leads appear stable.  There is progressive cardiomegaly with worsening perihilar and lower lobe air space opacities most consistent with edema.  There are probable bilateral pleural effusions.  No pneumothorax is identified.  IMPRESSION: Probable congestive heart failure with worsening edema and bilateral pleural effusions.  Aspiration pneumonia could have a similar appearance.  Original Report Authenticated By: Gerrianne Scale, M.D.     1. Respiratory failure with hypercapnia   2. Aspiration pneumonia   3. Hypoxia       MDM  Pt is seen and examined;  Initial history and physical completed.  Will follow very closely. BiPAP has been ordered. Second IV, O2, cardiac monitoring, continuous pulse ox. Stat labs, stat portable chest x-ray. EKG is reviewed, showing no signs of any acute ST segment changes.  Have also ordered blood cultures, and lactic acid, with concern for possible septic source.    Date: 06/06/2011  Rate: 87  Rhythm: atrial fibrillation  QRS Axis: right  Intervals: normal  ST/T Wave abnormalities: nonspecific ST/T changes  Conduction Disutrbances:none  Narrative Interpretation:   Old EKG Reviewed: none available  EKG done by paramedics, was also reviewed, showing a wide complex paced rhythm. Looks specifically different from the 12-lead that we were able to obtain at the bedside. Upon the patient's arrival    2:02 PM  Respiratory therapist is in the room at the bedside. BiPAP has been placed and patient appears to be improving. At this time. He is able to follow commands and answer Some simple questions.  No recent hospitalizations. Will obtain arterial blood gas now and after initial treatment to reassess. Patient is hypertensive. Will follow very closely  CRITICAL CARE Performed by: Valma Cava A.   Total critical care  time: 30   Critical care time was exclusive of separately billable procedures and treating  other patients.  Critical care was necessary to treat or prevent imminent or life-threatening deterioration.  Critical care was time spent personally by me on the following activities: development of treatment plan with patient and/or surrogate as well as nursing, discussions with consultants, evaluation of patient's response to treatment, examination of patient, obtaining history from patient or surrogate, ordering and performing treatments and interventions, ordering and review of laboratory studies, ordering and review of radiographic studies, pulse oximetry and re-evaluation of patient's condition.         2:02 PM  Discussed with the pulmonary critical care physician. All pertinent details, including presenting complaints chest x-ray, ABG, and initial treatment were provided. The pulmonary critical care physicians will be down to see and admit the patient. At this time. Laboratory studies are currently pending    Results for orders placed during the hospital encounter of 06/06/11  CARDIAC PANEL(CRET KIN+CKTOT+MB+TROPI)      Component Value Range   Total CK 39  7 - 232 (U/L)   CK, MB 3.7  0.3 - 4.0 (ng/mL)   Troponin I <0.30  <0.30 (ng/mL)   Relative Index RELATIVE INDEX IS INVALID  0.0 - 2.5   CBC      Component Value Range   WBC 14.9 (*) 4.0 - 10.5 (K/uL)   RBC 5.60  4.22 - 5.81 (MIL/uL)   Hemoglobin 16.8  13.0 - 17.0 (g/dL)   HCT 40.9  81.1 - 91.4 (%)   MCV 85.2  78.0 - 100.0 (fL)   MCH 30.0  26.0 - 34.0 (pg)   MCHC 35.2  30.0 - 36.0 (g/dL)   RDW 78.2  95.6 - 21.3 (%)   Platelets 295  150 - 400 (K/uL)  PRO B NATRIURETIC PEPTIDE      Component Value Range   Pro B Natriuretic peptide (BNP) 1049.0 (*) 0 - 125 (pg/mL)  LACTIC ACID, PLASMA      Component Value Range   Lactic Acid, Venous 0.6  0.5 - 2.2 (mmol/L)  POCT I-STAT 3, BLOOD GAS (G3+)      Component Value Range   pH, Arterial 7.122 (*) 7.350 - 7.450    pCO2 arterial 94.4 (*) 35.0 - 45.0 (mmHg)   pO2, Arterial  101.0 (*) 80.0 - 100.0 (mmHg)   Bicarbonate 30.8 (*) 20.0 - 24.0 (mEq/L)   TCO2 34  0 - 100 (mmol/L)   O2 Saturation 94.0     Acid-base deficit 3.0 (*) 0.0 - 2.0 (mmol/L)   Collection site RADIAL, ALLEN'S TEST ACCEPTABLE     Drawn by Operator     Sample type ARTERIAL     Comment NOTIFIED PHYSICIAN    POCT I-STAT, CHEM 8      Component Value Range   Sodium 129 (*) 135 - 145 (mEq/L)   Potassium 4.4  3.5 - 5.1 (mEq/L)   Chloride 95 (*) 96 - 112 (mEq/L)   BUN 25 (*) 6 - 23 (mg/dL)   Creatinine, Ser 0.86  0.50 - 1.35 (mg/dL)   Glucose, Bld 578 (*) 70 - 99 (mg/dL)   Calcium, Ion 4.69  6.29 - 1.32 (mmol/L)   TCO2 30  0 - 100 (mmol/L)   Hemoglobin 18.4 (*) 13.0 - 17.0 (g/dL)   HCT 52.8 (*) 41.3 - 52.0 (%)  POCT I-STAT TROPONIN I      Component Value Range   Troponin i, poc 0.01  0.00 - 0.08 (  ng/mL)   Comment 3           GLUCOSE, CAPILLARY      Component Value Range   Glucose-Capillary 156 (*) 70 - 99 (mg/dL)   Dg Chest 2 View  96/09/5407  *RADIOLOGY REPORT*  Clinical Data: Chest pain and shortness of breath.  CHEST - 2 VIEW  Comparison: 12/04/2010  Findings: The patient has mild cardiomegaly with slight increased pulmonary vascular congestion and small bilateral pleural effusions as well as peribronchial thickening.  I suspect this represents mild pulmonary edema. AICD in place.  IMPRESSION: Probable mild congestive heart failure with slight interstitial edema and small effusions.  Original Report Authenticated By: Gwynn Burly, M.D.   Dg Chest Portable 1 View  06/06/2011  *RADIOLOGY REPORT*  Clinical Data: Shortness of breath.  PORTABLE CHEST - 1 VIEW  Comparison: 06/05/2011 and 11/26/2010.  Findings: 1057 hours.  Current examination is limited by the low lung volumes, motion and overlap of the patient's mandible with the lung apices.  The left subclavian AICD leads appear stable.  There is progressive cardiomegaly with worsening perihilar and lower lobe air space opacities most  consistent with edema.  There are probable bilateral pleural effusions.  No pneumothorax is identified.  IMPRESSION: Probable congestive heart failure with worsening edema and bilateral pleural effusions.  Aspiration pneumonia could have a similar appearance.  Original Report Authenticated By: Gerrianne Scale, M.D.      Anyssa Sharpless A. Patrica Duel, MD 06/06/11 8119

## 2011-06-06 NOTE — ED Notes (Signed)
PCCM:   

## 2011-06-06 NOTE — ED Notes (Signed)
Patient breathing better while on bi-pap, patient appears to be drifting asleep and awakes when you call his name. Patient remains on bi-pap and monitor. Lab at bedside.

## 2011-06-06 NOTE — ED Notes (Signed)
Report given to Rosey Bath, RN 2100. Patient transferred to 2113.

## 2011-06-06 NOTE — Progress Notes (Signed)
ANTICOAGULATION CONSULT NOTE - Follow Up Consult  Pharmacy Consult for Heparin/Warfarin Indication: atrial fibrillation  No Known Allergies  Admit Complaint: 65 yo M admitted from how with respiratory failure and possible pneumonia   Pharmacist System-Based Medication Review:   Anticoagulation: Afib, DVT Prophylaxsis, INR 6.09, no significant bleeding, streaky blood in foley, and in ETT, not clinically significant, Hgb down to 15.2 from 18.4, likely dilutional.    Plan: Will hold heparin and warfarin for now, follow INR daily.    Patient Measurements: Height: 5\' 6"  (167.6 cm) Weight: 281 lb 8.4 oz (127.7 kg) IBW/kg (Calculated) : 63.8    Vital Signs: BP: 147/96 mmHg (12/29 1637) Pulse Rate: 84  (12/29 1645)  Labs:  Basename 06/06/11 1600 06/06/11 1120 06/06/11 1044  HGB 15.2 18.4* --  HCT 43.1 54.0* 47.7  PLT 293 -- 295  APTT 63* -- --  LABPROT 55.0* -- --  INR 6.09* -- --  HEPARINUNFRC -- -- --  CREATININE 1.04 1.10 --  CKTOTAL 32 -- 39  CKMB 3.6 -- 3.7  TROPONINI <0.30 -- <0.30   Estimated Creatinine Clearance: 87.2 ml/min (by C-G formula based on Cr of 1.04).   Dealva Lafoy Merrily Brittle 06/06/2011,5:05 PM

## 2011-06-06 NOTE — H&P (Signed)
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 He was acute hypercarbic and hypoxic respiratory failure on arrival. PCCM asked to admit.   Lines / Drains: OETT 12/29>>> Right IJ CVL 12/29>>>  Cultures: Sputum 12/29>>> BC X2 12/29>>>> procalcitonin 12/29>>>> Lactate 12/29>>> Influenza pcr 12/29>>>  Antibiotics: avelox (CAP)12/29>>> Rocephin (CAP)12/29 vanc (CAP) 12/29>>>  Tests / Events:  The patient is sedated, intubated and unable to provide history, which was obtained for available medical records.    Past Medical History  Diagnosis Date  . Pneumonia, community acquired     bilateral bibasilar   . COPD exacerbation   . Atrial fibrillation   . Hypertension   . OSA (obstructive sleep apnea)     declines mask  . Depression   . Gouty arthritis   . CAD (coronary artery disease)     predominantly single vessel  . Ventricular tachycardia     s/p defibrillator-Medtronic EnTrust [id number]  . Dilated cardiomyopathy     s/p defibrillator Medtronic EnTrust [phone]  . Asthma   . 2841 Lead   . Implantable Defibrillator     Medtronic Entrust  . Chronic systolic heart failure    Past Surgical History  Procedure Date  . Cardiac defibrillator placement 2007    Medtronic EnTrust [phone]  . Nephrectomy 1976    Right  . Arthroscopic knee surgery   . Cardiac catheterization 2007    60% Stenosis mid-CFX   Prior to Admission medications   Medication Sig Start Date End Date Taking? Authorizing Provider  acetaminophen (TYLENOL EX ST ARTHRITIS PAIN) 500 MG tablet Take 500 mg by mouth every 6 (six) hours as needed. For pain    Yes Historical Provider, MD  albuterol (PROVENTIL HFA;VENTOLIN HFA) 108 (90 BASE) MCG/ACT inhaler Inhale 2 puffs into the lungs every 4 (four)  hours as needed. For shortness of breath    Yes Historical Provider, MD  allopurinol (ZYLOPRIM) 300 MG tablet Take 300 mg by mouth daily.    Yes Historical Provider, MD  amiodarone (PACERONE) 200 MG tablet Take 200 mg by mouth daily.     Yes Historical Provider, MD  aspirin 81 MG chewable tablet Chew 81 mg by mouth daily.     Yes Historical Provider, MD  benazepril (LOTENSIN) 40 MG tablet Take 40 mg by mouth daily.     Yes Historical Provider, MD  budesonide-formoterol (SYMBICORT) 160-4.5 MCG/ACT inhaler Inhale 2 puffs into the lungs 2 (two) times daily.     Yes Historical Provider, MD  carvedilol (COREG) 25 MG tablet Take 25 mg by mouth 2 (two) times daily with a meal.     Yes Historical Provider, MD  fenofibrate micronized (LOFIBRA) 134 MG capsule Take 134 mg by mouth daily.    Yes Historical Provider, MD  furosemide (LASIX) 40 MG tablet Take 40 mg by mouth daily.    Yes Historical Provider, MD  isosorbide mononitrate (IMDUR) 30 MG 24 hr tablet Take 30 mg by mouth daily.     Yes Historical Provider, MD  levothyroxine (SYNTHROID, LEVOTHROID) 100 MCG tablet Take 100 mcg by mouth daily.    Yes Historical Provider, MD  loratadine (CLARITIN) 10 MG tablet Take 10 mg by mouth daily.     Yes Historical Provider, MD  potassium chloride SA (K-DUR,KLOR-CON) 20 MEQ  tablet Take 20 mEq by mouth daily.    Yes Historical Provider, MD  warfarin (COUMADIN) 5 MG tablet Take 5 mg by mouth daily.     Historical Provider, MD   Allergies No Known Allergies  Family History Family History  Problem Relation Age of Onset  . Heart disease Other     uncle  . Diabetes Other     uncle    Social History  reports that he has never smoked. He has never used smokeless tobacco. He reports that he drinks alcohol. He reports that he does not use illicit drugs.  Review Of Systems  Unable due to altered mental status  Vital Signs: Pulse Rate:  [84-89] 84  (12/29 1119) BP: (111-186)/(74-124) 111/74 mmHg (12/29  1119) SpO2:  [94 %-100 %] 95 % (12/29 1119)       Physical Examination: General:  Chronically ill appearing white male, somnolent on NIPPV Neuro:  Sedated, opens eyes but quickly drifts back to sleep HEENT:  No JVD, neck large Cardiovascular:  Rrr, dist s1s2/ afib  Lungs:  Scattered rhonchi, decreased all fields Abdomen:  Soft, NT Positive BS Musculoskeletal:  No dig edema, scattered areas of echymosis   Ventilator settings:    Labs and Imaging:   Lab 06/06/11 1120  NA 129*  K 4.4  CL 95*  CO2 --  BUN 25*  CREATININE 1.10  GLUCOSE 150*    Lab 06/06/11 1120  HGB 18.4*  HCT 54.0*  WBC --  PLT --   Lab Results  Component Value Date   INR 2.94* 08/29/2010   INR 2.68* 08/28/2010   INR 2.08* 08/27/2010   ABG    Component Value Date/Time   PHART 7.122* 06/06/2011 1050   PCO2ART 94.4* 06/06/2011 1050   PO2ART 101.0* 06/06/2011 1050   HCO3 30.8* 06/06/2011 1050   TCO2 30 06/06/2011 1120   ACIDBASEDEF 3.0* 06/06/2011 1050   O2SAT 94.0 06/06/2011 1050     Assessment and Plan:   Acute respiratory failure with combined hypoxic and  Hypercarbic respiratory failure in  Setting of CAP (community acquired pneumonia), and complicated by possible h/o Asthma Plan: -intubate/full vent support -sedation protocol -pan-culture: influenza PCR, sputum C&S -cycle enzymes -empiric abx  Sepsis 2/2 CAP (NOS)No results found for this basename: PROCALCITON:4 in the last 168 hours Plan: -check lactate -trend PCT -see dashboard   Dilated cardiomyopathy, Chronic systolic heart failure, and  Atrial fibrillation Plan -rate control -coumadin per pharmacy   Warfarin-induced coagulopathy Lab Results  Component Value Date   INR 2.94* 08/29/2010   INR 2.68* 08/28/2010   INR 2.08* 08/27/2010  plan -trend, hold coumadin today   Encephalopathy acute: due to hypercarbia +/1 sepsis Plan: -supportive care   Hypothyroid Plan: -replace  Best practices / Disposition: -->ICU status  under PCCM -->full code -->coumadin  for DVT Px and AFIB -->Protonix for GI Px -->ventilator bundle -->diet: NPO -->family updated at bedside  The patient is critically ill with multiple organ systems failure and requires high complexity decision making for assessment and support, frequent evaluation and titration of therapies, application of advanced monitoring technologies and extensive interpretation of multiple databases. Critical Care Time devoted to patient care services described in this note is 60 minutes.  BABCOCK,PETE 06/06/2011, 11:24 AM  Patient seen and examined, agree with above note.  I dictated the care and orders written for this patient under my direction.  Koren Bound, M.D.

## 2011-06-06 NOTE — Progress Notes (Signed)
ANTIBIOTIC & Anticoagulation CONSULT NOTE - INITIAL  Pharmacy Consult for Vancomycin Indication: rule out pneumonia  No Known Allergies  Admit Complaint: 65 yo M admitted from how with respiratory failure and possible pneumonia  Pharmacist System-Based Medication Review: Anticoagulation: Afib, DVT Prophylaxsis, no baseline INR back yet, will wait until INR back, if < goal will start heparin, holding warfarin till more clinically stable. Infectious Disease: CAP: D#0 vancomycin, avelox, tamiflu, Cultures 12/29 - trach, blood, flu Cardiovascular: Afib, CAD, VT (with ICD), CHF - follow up resume of home medications when clinically indicated Endocrinology: Gout, resume allopurinol when clinically indicated. Gastrointestinal / Nutrition: NPO Neurology: Depression, not on therapy PTA, Sedated on vent with PRN fent/versed Nephrology: CrCl 66 ml/min, difficult foley placement, done by urology, follow SCr Pulmonary: COPD, OSA, Asthma, on vent PRVC 20/510/10 10% FIO2 PTA Medication Issues: Not yet resumed: amiodarone 200 daily, aspirin 81, benazepril 40 daily, cored 25 bid, lasix 40 daily, IMDUR 30 daily, levothyroxine 100 daily,  Allopurinol 300 mg daily, med history from cousins who seemed to know what he was doing, said he was taking everything (? Compliance) Best Practices: IV protonix, heparin/warfarin, follow up initiation of ICU mouth care protocol  Goal of Therapy:  Vancomycin 15-20 mcg/ml, Heparin level 0.3-0.7, while INR < goal.  Plan:  1. Vancomycin 2.5 g load, will order additional 1500 mg and then start 1500 mg IV q12h 2. Follow up INR result and order further anticoagulation as indicated.  Medications:  Prescriptions prior to admission  Medication Sig Dispense Refill  . acetaminophen (TYLENOL EX ST ARTHRITIS PAIN) 500 MG tablet Take 500 mg by mouth every 6 (six) hours as needed. For pain       . albuterol (PROVENTIL HFA;VENTOLIN HFA) 108 (90 BASE) MCG/ACT inhaler Inhale 2 puffs  into the lungs every 4 (four) hours as needed. For shortness of breath       . allopurinol (ZYLOPRIM) 300 MG tablet Take 300 mg by mouth daily.       Marland Kitchen amiodarone (PACERONE) 200 MG tablet Take 200 mg by mouth daily.        Marland Kitchen aspirin 81 MG chewable tablet Chew 81 mg by mouth daily.        . benazepril (LOTENSIN) 40 MG tablet Take 40 mg by mouth daily.        . budesonide-formoterol (SYMBICORT) 160-4.5 MCG/ACT inhaler Inhale 2 puffs into the lungs 2 (two) times daily.        . carvedilol (COREG) 25 MG tablet Take 25 mg by mouth 2 (two) times daily with a meal.        . fenofibrate micronized (LOFIBRA) 134 MG capsule Take 134 mg by mouth daily.       . furosemide (LASIX) 40 MG tablet Take 40 mg by mouth daily.       Marland Kitchen guaiFENesin (MUCINEX) 600 MG 12 hr tablet Take 600 mg by mouth 2 (two) times daily.        . isosorbide mononitrate (IMDUR) 30 MG 24 hr tablet Take 30 mg by mouth daily.        Marland Kitchen levothyroxine (SYNTHROID, LEVOTHROID) 100 MCG tablet Take 100 mcg by mouth daily.       Marland Kitchen loratadine (CLARITIN) 10 MG tablet Take 10 mg by mouth daily.        . potassium chloride SA (K-DUR,KLOR-CON) 20 MEQ tablet Take 20 mEq by mouth daily.       Marland Kitchen warfarin (COUMADIN) 5 MG tablet Take 5 mg by mouth  daily.        Patient Measurements: Height: 5\' 6"  (167.6 cm) IBW/kg (Calculated) : 63.8  TBW = 156 kg  Vital Signs: BP: 115/84 mmHg (12/29 1315) Pulse Rate: 105  (12/29 1408) ILabs:  Basename 06/06/11 1120 06/06/11 1044  WBC -- 14.9*  HGB 18.4* 16.8  PLT -- 295  LABCREA -- --  CREATININE 1.10 --   The CrCl is unknown because both a height and weight (above a minimum accepted value) are required for this calculation. No results found for this basename: VANCOTROUGH:2,VANCOPEAK:2,VANCORANDOM:2,GENTTROUGH:2,GENTPEAK:2,GENTRANDOM:2,TOBRATROUGH:2,TOBRAPEAK:2,TOBRARND:2,AMIKACINPEAK:2,AMIKACINTROU:2,AMIKACIN:2, in the last 72 hours   Microbiology: No results found for this or any previous visit (from the  past 720 hour(s)).  Medical History: Past Medical History  Diagnosis Date  . Pneumonia, community acquired     bilateral bibasilar   . COPD exacerbation   . Atrial fibrillation   . Hypertension   . OSA (obstructive sleep apnea)     declines mask  . Depression   . Gouty arthritis   . CAD (coronary artery disease)     predominantly single vessel  . Ventricular tachycardia     s/p defibrillator-Medtronic EnTrust [id number]  . Dilated cardiomyopathy     s/p defibrillator [employer] EnTrust [phone]  . Asthma   . 9604 Lead   . Implantable Defibrillator     Medtronic Entrust  . Chronic systolic heart failure     Fia Hebert Christine Virginia Crews 06/06/2011,3:43 PM

## 2011-06-06 NOTE — Procedures (Signed)
Intubation Procedure Note Cody Faulkner [id number] 01/23/1946  Procedure: Intubation Indications: Respiratory insufficiency  Procedure Details Consent: Unable to obtain consent because of emergent medical necessity. Time Out: Verified patient identification, verified procedure, site/side was marked, verified correct patient position, special equipment/implants available, medications/allergies/relevent history reviewed, required imaging and test results available.  Performed  Maximum sterile technique was used including antiseptics.  MAC    Evaluation Hemodynamic Status: BP stable throughout; O2 sats: stable throughout Patient's Current Condition: stable Complications: No apparent complications Patient did tolerate procedure well. Chest X-ray ordered to verify placement.  CXR: pending.   BABCOCK,PETE 06/06/2011

## 2011-06-06 NOTE — ED Notes (Signed)
Pt with no gag reflex during NG placement. Pt with no purposeful movements at this time. Pt is tolerating vent well.

## 2011-06-06 NOTE — ED Notes (Signed)
Per EMS - patient had been out side this morning and came back in sat down in chair and was no himself per family. Patient in respiratory distress when EMS arrived. Patient was being treated last week for pneumonia.

## 2011-06-06 NOTE — ED Notes (Addendum)
pts blood pressure noted to be 1156 73/59. IV fluids opened. pccm remains at bedside. Blood pressure verified.central line placed to right EJ. Good blood return. Per PCCM okayed to use central line. Xray has been called for verification of tube and central line. Levophed started per protocol to central line. Will place foley temp. pccm aware of pts status. pccm unable to place art line.

## 2011-06-07 ENCOUNTER — Inpatient Hospital Stay (HOSPITAL_COMMUNITY): Payer: Medicare Other

## 2011-06-07 DIAGNOSIS — I509 Heart failure, unspecified: Secondary | ICD-10-CM

## 2011-06-07 DIAGNOSIS — E872 Acidosis: Secondary | ICD-10-CM

## 2011-06-07 DIAGNOSIS — J9600 Acute respiratory failure, unspecified whether with hypoxia or hypercapnia: Secondary | ICD-10-CM

## 2011-06-07 DIAGNOSIS — I517 Cardiomegaly: Secondary | ICD-10-CM

## 2011-06-07 DIAGNOSIS — R4182 Altered mental status, unspecified: Secondary | ICD-10-CM

## 2011-06-07 LAB — URINE CULTURE
Colony Count: NO GROWTH
Culture  Setup Time: 201212292021
Culture: NO GROWTH

## 2011-06-07 LAB — BASIC METABOLIC PANEL
CO2: 23 mEq/L (ref 19–32)
Calcium: 8.3 mg/dL — ABNORMAL LOW (ref 8.4–10.5)
Creatinine, Ser: 2.04 mg/dL — ABNORMAL HIGH (ref 0.50–1.35)
GFR calc Af Amer: 37 mL/min — ABNORMAL LOW (ref >90)
GFR calc non Af Amer: 32 mL/min — ABNORMAL LOW (ref >90)
Sodium: 127 mEq/L — ABNORMAL LOW (ref 135–145)

## 2011-06-07 LAB — CBC
MCH: 29.1 pg (ref 26.0–34.0)
MCHC: 34.1 g/dL (ref 30.0–36.0)
MCV: 85.2 fL (ref 78.0–100.0)
Platelets: 176 10*3/uL (ref 150–400)
RBC: 4.92 MIL/uL (ref 4.22–5.81)
RDW: 13.7 % (ref 11.5–15.5)

## 2011-06-07 LAB — PHOSPHORUS: Phosphorus: 2.6 mg/dL (ref 2.3–4.6)

## 2011-06-07 LAB — GLUCOSE, CAPILLARY
Glucose-Capillary: 66 mg/dL — ABNORMAL LOW (ref 70–99)
Glucose-Capillary: 123 mg/dL — ABNORMAL HIGH (ref 70–99)
Glucose-Capillary: 105 mg/dL — ABNORMAL HIGH (ref 70–99)
Glucose-Capillary: 95 mg/dL (ref 70–99)
Glucose-Capillary: 90 mg/dL (ref 70–99)
Glucose-Capillary: 97 mg/dL (ref 70–99)

## 2011-06-07 LAB — CARDIAC PANEL(CRET KIN+CKTOT+MB+TROPI)
CK, MB: 2.8 ng/mL (ref 0.3–4.0)
Relative Index: INVALID (ref 0.0–2.5)
Total CK: 23 U/L (ref 7–232)
Troponin I: <0.3 ng/mL (ref <0.30)

## 2011-06-07 LAB — PROTIME-INR
Prothrombin Time: 63.2 seconds — ABNORMAL HIGH (ref 11.6–15.2)
Prothrombin Time: 58.3 seconds — ABNORMAL HIGH (ref 11.6–15.2)

## 2011-06-07 LAB — ABO/RH: ABO/RH(D): O POS

## 2011-06-07 MED ORDER — NOREPINEPHRINE BITARTRATE 1 MG/ML IJ SOLN
2.0000 ug/min | INTRAVENOUS | Status: DC
  Administered 2011-06-07: 15 ug/min via INTRAVENOUS
  Administered 2011-06-09: 4 ug/min via INTRAVENOUS
  Filled 2011-06-07 (×3): qty 8

## 2011-06-07 MED ORDER — SODIUM CHLORIDE 0.9 % IJ SOLN
INTRAMUSCULAR | Status: AC
  Filled 2011-06-07: qty 20

## 2011-06-07 MED ORDER — FUROSEMIDE 10 MG/ML IJ SOLN
60.0000 mg | Freq: Once | INTRAMUSCULAR | Status: AC
  Administered 2011-06-07: 60 mg via INTRAVENOUS
  Filled 2011-06-07: qty 6

## 2011-06-07 MED ORDER — SODIUM CHLORIDE 0.9 % IV SOLN
1750.0000 mg | INTRAVENOUS | Status: DC
  Administered 2011-06-08: 1750 mg via INTRAVENOUS
  Filled 2011-06-07: qty 1750

## 2011-06-07 MED ORDER — DEXTROSE 50 % IV SOLN
INTRAVENOUS | Status: AC
  Administered 2011-06-07: 25 mL
  Filled 2011-06-07: qty 50

## 2011-06-07 NOTE — Progress Notes (Signed)
eLink Physician-Brief Progress Note Patient Name: Cody Faulkner DOB: [birth year] MRN: [id number]  Date of Service  03/26/202012   HPI/Events of Note   I>>>Os,  Just got FFP, now frothy ETT fluid  eICU Interventions  See lasix iv order    Intervention Category Major Interventions: Respiratory failure - evaluation and management  Shan Levans 03/26/202012, 6:09 PM

## 2011-06-07 NOTE — Progress Notes (Signed)
CRITICAL VALUE ALERT  Critical value received:  INR 7.28  Date of notification:  06/07/11  Time of notification:  0540  Critical value read back:yes  Nurse who received alert:  Crist Fat RN  MD notified (1st page):  Dr. Janyth Pupa  Time of first page:  0541  MD notified (2nd page):Dr. Hulitt  Time of second page:NA  Responding MD:  Dr. Janyth Pupa  Time MD responded:  [phone]

## 2011-06-07 NOTE — Progress Notes (Signed)
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 He was acute hypercarbic and hypoxic respiratory failure on arrival. PCCM asked to admit.   Lines / Drains: OETT 12/29>>> Right IJ CVL 12/29>>> R radial a-line 12/29>>>  Cultures: Sputum 12/29>>> BC X2 12/29>>>> procalcitonin 12/29>>>> Lactate 12/29>>> Influenza pcr 12/29>>>negative Urine strep 12/30>>> Urine legionella 12/30>>>  Antibiotics: Avelox (CAP)12/29>>> Rocephin (CAP)12/29 Vanc (CAP) 12/29>>>  Tests / Events: Echo 12/29>>>  Subjective/interval history Sedated on vent. Weaning pressors  Vital Signs: Temp:  [98.6 F (37 C)-98.9 F (37.2 C)] 98.7 F (37.1 C) (12/30 1145) Pulse Rate:  [47-106] 87  (12/30 0945) Resp:  [12-27] 22  (12/30 0945) BP: (52-147)/(34-96) 76/46 mmHg (12/30 0900) SpO2:  [94 %-100 %] 94 % (12/30 0945) Arterial Line BP: (69-187)/(48-115) 83/56 mmHg (12/30 0945) FiO2 (%):  [49.6 %-100 %] 49.8 % (12/30 0945) Weight:  [127.4 kg (280 lb 13.9 oz)-127.7 kg (281 lb 8.4 oz)] 280 lb 13.9 oz (127.4 kg) (12/30 0300) CVP: 12    . sodium chloride 10 mL/hr at 06/06/11 1400  . fentaNYL infusion INTRAVENOUS 50 mcg/hr (06/07/11 0000)  . midazolam (VERSED) infusion 2 mg/hr (06/07/11 0919)  . norepinephrine (LEVOPHED) Adult infusion 3 mcg/min (06/07/11 0600)   I/O last 3 completed shifts: In: 2729.3 [I.V.:359.3; NG/GT:60; IV Piggyback:2310] Out: 522 [Urine:522] Physical Examination: General:  Chronically ill appearing white male, arousable on vent Neuro:  Sedated, opens eyes, tries to communicate.  HEENT:  No JVD, neck large Cardiovascular:  Rrr, dist s1s2/ afib  Lungs:  Scattered rhonchi, decreased all fields Abdomen:  Soft, NT Positive BS Musculoskeletal:  No dig edema, scattered  areas of echymosis  Ventilator settings: Vent Mode:  [-] PRVC FiO2 (%):  [49.6 %-100 %] 49.8 % Set Rate:  [20 bmp] 20 bmp Vt Set:  [510 mL] 510 mL PEEP:  [5 cmH20-10.1 cmH20] 5 cmH20 Plateau Pressure:  [18 cmH20-30 cmH20] 20 cmH20  Labs and Imaging:   Lab 06/07/11 0500 06/06/11 1600 06/06/11 1120  NA 127* 125* 129*  K 4.3 4.7 4.4  CL 97 95* 95*  CO2 23 24 --  BUN 25* 21 25*  CREATININE 2.04* 1.04 1.10  GLUCOSE 87 159* 150*    Lab 06/07/11 0500 06/06/11 2141 06/06/11 1600  HGB 14.3 14.6 15.2  HCT 41.9 41.6 43.1  WBC 11.3* 9.6 13.1*  PLT 176 199 293   Lab Results  Component Value Date   INR 7.28* 04/21/202012   INR 6.09* 06/06/2011   INR 2.94* 08/29/2010   ABG    Component Value Date/Time   PHART 7.319* 06/06/2011 1502   PCO2ART 50.3* 06/06/2011 1502   PO2ART 216.0* 06/06/2011 1502   HCO3 26.1* 06/06/2011 1502   TCO2 28 06/06/2011 1502   ACIDBASEDEF 1.0 06/06/2011 1502   O2SAT 100.0 06/06/2011 1502  PCXR:  Increased right > left airspace disease.    Assessment and Plan:   Acute respiratory failure with combined hypoxic and  Hypercarbic respiratory failure in  Setting of CAP (community acquired pneumonia), now progressive bilateral pulmonary infiltrates R>L. Most likely ALI, but could be element of edema.  Plan: - Cont full supporrt - Sedation protocol - F/u culture - Sputum C&S - Empiric abx - Keep atypical coverage to cover for chlamydia pneumonia, patient is a Freight forwarder.  Sepsis 2/2 CAP (NOS) No results found for this basename: PROCALCITON:4 in the last 168 hours Plan: -trend PCT -see dashboard  Acute renal failure. H/o solitary kidney: in setting of shock Recent Labs  Basename 06/07/11 0500 06/06/11 1600 06/06/11 1120   CREATININE 2.04* 1.04 1.10  plan: - Keep euvolemic. - F/u chemistry. - Follow CVP.  Dilated cardiomyopathy, Chronic systolic heart failure, and  Atrial fibrillation Plan - Rate control - Coumadin per  pharmacy  Warfarin-induced coagulopathy Lab Results  Component Value Date   INR 7.28* 12-18-2010   INR 6.09* 06/06/2011   INR 2.94* 08/29/2010  Plan - Trend, hold coumadin today  Encephalopathy acute: due to hypercarbia +/1 sepsis Plan: - Supportive care  Hypothyroid Plan: - Replace  Best practices / Disposition: -->ICU status under PCCM -->full code -->coumadin  for DVT Px and AFIB -->Protonix for GI Px -->ventilator bundle -->diet: TF -->family updated at bedside  The patient is critically ill with multiple organ systems failure and requires high complexity decision making for assessment and support, frequent evaluation and titration of therapies, application of advanced monitoring technologies and extensive interpretation of multiple databases. Critical Care Time devoted to patient care services described in this note is 35 minutes.  BABCOCK,PETE 12-18-2010, 12:16 PM  Patient seen and examined, agree with above note.  I dictated the care and orders written for this patient under my direction.  Koren Bound, M.D. [phone]

## 2011-06-07 NOTE — Progress Notes (Signed)
ANTICOAGULATION & Antibiotic CONSULT NOTE - Follow Up Consult  Pharmacy Consult for Heparin/Warfarin; Vancomycin Indication: atrial fibrillation, empiric pneumonia  No Known Allergies  Admit Complaint: 65 yo M admitted from home with respiratory failure and possible pneumonia   Pharmacist System-Based Medication Review:   Anticoagulation: Afib, DVT Prophylaxsis, INR 7.28, no  Bleeding noted, Hgb down to 14.3 from 18.4, likely dilutional. Continue to hold anticoagulation, consider 2.5 mg vitamin K PO given persistently high INR . Infectious Disease: CAP: D#1 vancomycin, avelox, tamiflu, ceftriaxone Cultures 12/29 - trach, blood, flu all bending ; WBC 11.3, Afebrile, given renal function will change vancomycin to 1750 mg IV q24h Cardiovascular: Afib, CAD, VT (with ICD), CHF -septic shock on levophed at 3 follow up resume of home medications when clinically indicated  Endocrinology: Gout, resume allopurinol when clinically indicated.  Gastrointestinal / Nutrition: NPO  Neurology: Depression, not on therapy PTA, Sedated on vent with PRN fent/versed  Nephrology: CrCl 44 ml/min, difficult foley placement, done by urology, follow SCr currently rising, no urine output Pulmonary: COPD, OSA, Asthma, on vent PRVC 20/510/5 50% FIO2  PTA Medication Issues: Not yet resumed: amiodarone 200 daily, aspirin 81, benazepril 40 daily, cored 25 bid, lasix 40 daily, IMDUR 30 daily, levothyroxine 100 daily, Allopurinol 300 mg daily, med history from cousins who seemed to know what he was doing, said he was taking everything (? Compliance)  Best Practices: IV protonix, heparin/warfarin currently INR supertherapeutic, follow up initiation of ICU mouth care protocol  Goal of Therapy:  Vancomycin 15-20 mcg/ml, Heparin level 0.3-0.7, while INR < goal.  PLAN: 1. Change Vancomycin to 1750 mg IV q24h 2. No warfarin or heparin given INR, consider 2.5 mg PO vitamin K given persistently elevated INR unless otherwise  clinically indicated.  Patient Measurements: Height: 5\' 6"  (167.6 cm) Weight: 280 lb 13.9 oz (127.4 kg) IBW/kg (Calculated) : 63.8   Medications:  Prescriptions prior to admission   Medication  Sig  Dispense  Refill   .  acetaminophen (TYLENOL EX ST ARTHRITIS PAIN) 500 MG tablet  Take 500 mg by mouth every 6 (six) hours as needed. For pain     .  albuterol (PROVENTIL HFA;VENTOLIN HFA) 108 (90 BASE) MCG/ACT inhaler  Inhale 2 puffs into the lungs every 4 (four) hours as needed. For shortness of breath     .  allopurinol (ZYLOPRIM) 300 MG tablet  Take 300 mg by mouth daily.     Marland Kitchen  amiodarone (PACERONE) 200 MG tablet  Take 200 mg by mouth daily.     Marland Kitchen  aspirin 81 MG chewable tablet  Chew 81 mg by mouth daily.     .  benazepril (LOTENSIN) 40 MG tablet  Take 40 mg by mouth daily.     .  budesonide-formoterol (SYMBICORT) 160-4.5 MCG/ACT inhaler  Inhale 2 puffs into the lungs 2 (two) times daily.     .  carvedilol (COREG) 25 MG tablet  Take 25 mg by mouth 2 (two) times daily with a meal.     .  fenofibrate micronized (LOFIBRA) 134 MG capsule  Take 134 mg by mouth daily.     .  furosemide (LASIX) 40 MG tablet  Take 40 mg by mouth daily.     Marland Kitchen  guaiFENesin (MUCINEX) 600 MG 12 hr tablet  Take 600 mg by mouth 2 (two) times daily.     .  isosorbide mononitrate (IMDUR) 30 MG 24 hr tablet  Take 30 mg by mouth daily.     Marland Kitchen  levothyroxine (SYNTHROID, LEVOTHROID) 100 MCG tablet  Take 100 mcg by mouth daily.     Marland Kitchen  loratadine (CLARITIN) 10 MG tablet  Take 10 mg by mouth daily.     .  potassium chloride SA (K-DUR,KLOR-CON) 20 MEQ tablet  Take 20 mEq by mouth daily.     Marland Kitchen  warfarin (COUMADIN) 5 MG tablet  Take 5 mg by mouth daily.        Vital Signs: Temp: 98.6 F (37 C) (12/30 0800) Temp src: Oral (12/30 0800) BP: 76/46 mmHg (12/30 0900) Pulse Rate: 87  (12/30 0945)  Labs:  Basename 06/07/11 0930 06/07/11 0500 06/07/11 0430 06/06/11 2141 06/06/11 1600 06/06/11 1120  HGB -- 14.3 -- 14.6 -- --  HCT  -- 41.9 -- 41.6 43.1 --  PLT -- 176 -- 199 293 --  APTT -- -- -- -- 63* --  LABPROT -- 63.2* -- -- 55.0* --  INR -- 7.28* -- -- 6.09* --  HEPARINUNFRC -- -- -- -- -- --  CREATININE -- 2.04* -- -- 1.04 1.10  CKTOTAL 23 -- 29 -- 32 --  CKMB 2.8 -- 3.5 -- 3.6 --  TROPONINI <0.30 -- <0.30 -- <0.30 --   Estimated Creatinine Clearance: 44.3 ml/min (by C-G formula based on Cr of 2.04).   Cody Faulkner 03-17-2011,10:23 AM

## 2011-06-07 NOTE — Progress Notes (Signed)
CRITICAL VALUE ALERT  Critical value received:  CBG 66  Date of notification:  06/07/11  Time of notification: 0440   Critical value read back:yes  Nurse who received alert:  Crist Fat RN  MD notified (1st page):  Dr. Michele Rockers  Time of first page:  0515  MD notified (2nd page):NA  Time of second page:NA  Responding MD:  Dr. Michele Rockers  Time MD responded:  [phone]

## 2011-06-07 NOTE — Progress Notes (Signed)
*  PRELIMINARY RESULTS* Echocardiogram 2D Echocardiogram has been performed.  Cody Faulkner [facility] 07-08-2010, 10:10 AM

## 2011-06-07 NOTE — Progress Notes (Signed)
Dr. Michele Rockers in elink notified of pt's oliguria overnight. Bladder flushed and no additional urine returned. Also, bladder scan performed and showed no urine in bladder. No new orders received.

## 2011-06-08 ENCOUNTER — Inpatient Hospital Stay (HOSPITAL_COMMUNITY): Payer: Medicare Other

## 2011-06-08 ENCOUNTER — Encounter (HOSPITAL_COMMUNITY): Payer: Self-pay | Admitting: *Deleted

## 2011-06-08 LAB — BASIC METABOLIC PANEL
BUN: 37 mg/dL — ABNORMAL HIGH (ref 6–23)
BUN: 39 mg/dL — ABNORMAL HIGH (ref 6–23)
CO2: 23 mEq/L (ref 19–32)
Calcium: 8.4 mg/dL (ref 8.4–10.5)
Calcium: 7.9 mg/dL — ABNORMAL LOW (ref 8.4–10.5)
Chloride: 96 mEq/L (ref 96–112)
Creatinine, Ser: 3.95 mg/dL — ABNORMAL HIGH (ref 0.50–1.35)
Creatinine, Ser: 4.05 mg/dL — ABNORMAL HIGH (ref 0.50–1.35)
GFR calc Af Amer: 17 mL/min — ABNORMAL LOW (ref >90)
GFR calc non Af Amer: 14 mL/min — ABNORMAL LOW (ref >90)
GFR calc non Af Amer: 14 mL/min — ABNORMAL LOW (ref >90)
Glucose, Bld: 99 mg/dL (ref 70–99)

## 2011-06-08 LAB — BLOOD GAS, ARTERIAL
Bicarbonate: 24 mEq/L (ref 20.0–24.0)
Drawn by: 252031
PEEP: 5 cmH2O
Patient temperature: 98.6
RATE: 20 resp/min
pH, Arterial: 7.392 (ref 7.350–7.450)
pO2, Arterial: 92.5 mmHg (ref 80.0–100.0)

## 2011-06-08 LAB — PREPARE FRESH FROZEN PLASMA: Unit division: 0

## 2011-06-08 LAB — GLUCOSE, CAPILLARY
Glucose-Capillary: 111 mg/dL — ABNORMAL HIGH (ref 70–99)
Glucose-Capillary: 91 mg/dL (ref 70–99)
Glucose-Capillary: 91 mg/dL (ref 70–99)

## 2011-06-08 LAB — CBC
HCT: 39.8 % (ref 39.0–52.0)
MCHC: 33.2 g/dL (ref 30.0–36.0)
Platelets: 207 10*3/uL (ref 150–400)
RDW: 14.3 % (ref 11.5–15.5)
WBC: 11.7 10*3/uL — ABNORMAL HIGH (ref 4.0–10.5)

## 2011-06-08 LAB — PROCALCITONIN: Procalcitonin: 3.07 ng/mL

## 2011-06-08 LAB — PROTIME-INR: Prothrombin Time: 31.7 seconds — ABNORMAL HIGH (ref 11.6–15.2)

## 2011-06-08 MED ORDER — DEXTROSE 5 % IV SOLN: 1.0000 g | Freq: Three times a day (TID) | INTRAVENOUS | Status: DC

## 2011-06-08 MED ORDER — MIDAZOLAM HCL 2 MG/2ML IJ SOLN: 1.0000 mg | INTRAMUSCULAR | Status: DC | PRN

## 2011-06-08 MED ORDER — VANCOMYCIN HCL 1000 MG IV SOLR: 1750.0000 mg | INTRAVENOUS | Status: DC

## 2011-06-08 MED ORDER — PRO-STAT SUGAR FREE PO LIQD
30.0000 mL | Freq: Every day | ORAL | Status: DC
  Administered 2011-06-09 – 2011-06-10 (×4): 30 mL
  Filled 2011-06-08 (×14): qty 30

## 2011-06-08 MED ORDER — DEXTROSE 5 % IV SOLN
1.0000 g | INTRAVENOUS | Status: DC
  Administered 2011-06-08 – 2011-06-09 (×2): 1 g via INTRAVENOUS
  Filled 2011-06-08 (×3): qty 1

## 2011-06-08 MED ORDER — NEPRO/CARBSTEADY PO LIQD
1000.0000 mL | ORAL | Status: DC
  Filled 2011-06-08 (×2): qty 1000

## 2011-06-08 MED ORDER — NEPRO/CARBSTEADY PO LIQD
1000.0000 mL | ORAL | Status: DC
  Administered 2011-06-08 – 2011-06-09 (×2): 1000 mL
  Filled 2011-06-08 (×4): qty 1000

## 2011-06-08 MED FILL — Perflutren Lipid Microsphere IV Susp 6.52 MG/ML: INTRAVENOUS | Qty: 2 | Status: AC

## 2011-06-08 NOTE — Progress Notes (Signed)
UR Completed.  Mordechai Matuszak Jane [phone]. 06/08/2011  

## 2011-06-08 NOTE — Progress Notes (Signed)
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 He was acute hypercarbic and hypoxic respiratory failure on arrival.  Lines / Drains: OETT 12/29>>> Right IJ CVL 12/29>>> R radial a-line 12/29>>>  Cultures: Sputum 12/29>>> BC X2 12/29>>>> procalcitonin 12/29>>>> Lactate 12/29>>> Influenza pcr 12/29>>>negative Urine strep 12/30>>> Urine legionella 12/30>>>  Antibiotics: Avelox (CAP)12/29>>> Rocephin (CAP)12/29 Vanc (CAP) 12/29>>> tamiflu 12/29>>>off  Tests / Events: Echo 12/29>>>poor acoustic windows. Very hard to see anything. Definity contrast was used. I think that the LV systolic function is normal. The RV appears mildly dilated with probably normal systolic function. Dilated IVC suggests elevated RV filling pressure.   Subjective/interval history Remains on pressors , levophed  Vital Signs: Temp:  [98.1 F (36.7 C)-99.6 F (37.6 C)] 98.9 F (37.2 C) (12/31 0742) Pulse Rate:  [53-130] 92  (12/31 0807) Resp:  [18-36] 20  (12/31 0807) BP: (63-118)/(44-76) 95/56 mmHg (12/31 0807) SpO2:  [86 %-99 %] 95 % (12/31 0807) Arterial Line BP: (78-172)/(51-99) 100/88 mmHg (12/30 1500) FiO2 (%):  [40 %-93.9 %] 40 % (12/31 0807) Weight:  [128.3 kg (282 lb 13.6 oz)] 282 lb 13.6 oz (128.3 kg) (12/31 0200) CVP: 12    . sodium chloride 10 mL/hr at 06/06/11 1400  . fentaNYL infusion INTRAVENOUS 50 mcg/hr (06/07/11 0000)  . midazolam (VERSED) infusion 2 mg/hr (06/07/11 1300)  . norepinephrine (LEVOPHED) Adult infusion 6 mcg/min (06/08/11 0600)  . DISCONTD: norepinephrine (LEVOPHED) Adult infusion 10 mcg/min (06/07/11 2100)   I/O last 3 completed shifts: In: 5151.2 [I.V.:1297.2; Blood:1364; NG/GT:180; IV Piggyback:2310] Out: 355 [Urine:355]  Physical  Examination: General:  rass -1 Neuro:  Sedated, opens eyes, perrl  HEENT:  Increased JVD, neck large Cardiovascular:  Rrr, dist s1s2/ afib  Lungs:  rhonchi Abdomen:  Soft, NT Positive BS Musculoskeletal:  Increased edema, scattered areas of echymosis  Ventilator settings: Vent Mode:  [-] PCV FiO2 (%):  [40 %-93.9 %] 40 % Set Rate:  [20 bmp] 20 bmp Vt Set:  [510 mL] 510 mL PEEP:  [5 cmH20] 5 cmH20 Pressure Support:  [8 cmH20] 8 cmH20 Plateau Pressure:  [16 cmH20-21 cmH20] 19 cmH20  Labs and Imaging:   Lab 06/08/11 0430 06/07/11 0500 06/06/11 1600  NA 129* 127* 125*  K 4.5 4.3 4.7  CL 96 97 95*  CO2 25 23 24   BUN 37* 25* 21  CREATININE 3.95* 2.04* 1.04  GLUCOSE 106* 87 159*    Lab 06/08/11 0430 06/07/11 0500 06/06/11 2141  HGB 13.2 14.3 14.6  HCT 39.8 41.9 41.6  WBC 11.7* 11.3* 9.6  PLT 207 176 199   Lab Results  Component Value Date   INR 3.01* 06/08/2011   INR 6.57* 06/12/2010   INR 7.28* 06/12/2010   ABG    Component Value Date/Time   PHART 7.392 06/08/2011 0515   PCO2ART 40.4 06/08/2011 0515   PO2ART 92.5 06/08/2011 0515   HCO3 24.0 06/08/2011 0515   TCO2 25.3 06/08/2011 0515   ACIDBASEDEF 0.3 06/08/2011 0515   O2SAT 97.7 06/08/2011 0515  PCXR:  Increased right > left airspace disease.    Assessment and Plan:   Acute respiratory failure with combined hypoxic and  Hypercarbic respiratory failure in  Setting of CAP (community acquired pneumonia), now progressive bilateral pulmonary infiltrates R>L. Most likely ALI, but could be  element of edema.  Plan: - Cont full support - Sedation protocol, limit versed if able - F/u culture - Sputum C&S - Empiric abx, see ID abg reviewed, remains on min settings, consider SBT pcxr c/w edema, with rising crt and low output, may need renal replacement therapy vs lasix challenge, holding for now with levo needs   Sepsis 2/2 CAP (NOS)  Lab 06/08/11 0430  PROCALCITON 3.07   Plan: -trend PCT to narrow  abx -continue vanc, avelox, rocephin Will ensure not hospitalized last 90 days, if son would change ceft to ceftaz  Acute renal failure. H/o solitary kidney: in setting of shock Recent Labs  Basename 06/08/11 0430 06/07/11 0500 06/06/11 1600   CREATININE 3.95* 2.04* 1.04  plan: - Keep euvolemic - F/u chemistry in pm , K wnl so far Did have over 400 cc urine, may need lasix challenge - Follow CVP, 12, kvo  Renal US - neg hydro Renal called, solitary kidney , double of crt and remains on pressors are the concnerns  Dilated cardiomyopathy, Chronic systolic heart failure, and  Atrial fibrillation Plan - Rate control - Coumadin per pharmacy Echo reviewed- poor images ef likely wnl  Warfarin-induced coagulopathy Lab Results  Component Value Date   INR 3.01* 06/08/2011   INR 6.57* 06-08-202012   INR 7.28* 06-08-202012  Plan - Trend, hold coumadin today ffp if hd cath needed coags in am   Encephalopathy acute: due to hypercarbia +/1 sepsis Plan: - Supportive care Dc verse drip if able fent wua  Hypothyroid Plan: - Replace  Best practices / Disposition: -->ICU status under PCCM -->full code -->coumadin  for DVT Px and AFIB, held coags up -->Protonix for GI Px -->ventilator bundle -->diet: TF start 12/30 -->family updated at bedside  The patient is critically ill with multiple organ systems failure and requires high complexity decision making for assessment and support, frequent evaluation and titration of therapies, application of advanced monitoring technologies and extensive interpretation of multiple databases. Critical Care Time devoted to patient care services described in this note is 35 minutes.  Nelda Bucks 06/08/2011, 8:53 AM  Mcarthur Rossetti. Tyson Alias, MD, FACP Pgr: [phone] [place] [facility]   Nelda Bucks., M.D. [phone]

## 2011-06-08 NOTE — Consult Note (Signed)
Reason for Consult:ARF Referring Physician: Rory Percy, MD  Cody Faulkner is an 65 y.o. male.  HPI: 65 yo WM with PMH sig for COPD, CAD, OSA, dilated CMP, and obesity who was admitted on 06/06/11 after collapsing from respiratory arrest at home.  Pt intubated and sedated, on pressors being treated for CAP and sepsis.  Pt is now on pressors in the MICU and we were asked to help evaluate and manage his ARF.  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  His creatinine's have fluctuated over the last year from 0.97 to 1.3 and was 1.1 at time of admission and trend is as follows:  Trend in Creatinine: Creatinine, Ser  Date/Time Value Range Status  06/08/2011  4:30 AM 3.95* 0.50-1.35 (mg/dL) Final     DELTA CHECK NOTED  09-13-2010  5:00 AM 2.04* 0.50-1.35 (mg/dL) Final     DELTA CHECK NOTED  06/06/2011  4:00 PM 1.04  0.50-1.35 (mg/dL) Final  16/03/9603 54:09 AM 1.10  0.50-1.35 (mg/dL) Final  01/16/9146  8:29 AM 1.47  0.4-1.5 (mg/dL) Final  5/62/1308  6:57 AM 1.27  0.4-1.5 (mg/dL) Final  8/46/9629  5:28 AM 0.97  0.4-1.5 (mg/dL) Final  09/19/2438  1:02 AM 1.3  0.4-1.5 (mg/dL) Final    PMH:   Past Medical History  Diagnosis Date  . Pneumonia, community acquired     bilateral bibasilar   . COPD exacerbation   . Atrial fibrillation   . Hypertension   . OSA (obstructive sleep apnea)     declines mask  . Depression   . Gouty arthritis   . CAD (coronary artery disease)     predominantly single vessel  . Ventricular tachycardia     s/p defibrillator-Medtronic EnTrust [id number]  . Dilated cardiomyopathy     s/p defibrillator Medtronic EnTrust [phone]  . Asthma   . 6644 Lead   . Implantable Defibrillator     Medtronic Entrust  . Chronic systolic heart failure     PSH:   Past Surgical History  Procedure Date  . Cardiac defibrillator placement 2007    Medtronic EnTrust [phone]  . Nephrectomy 1976   Right  . Arthroscopic knee surgery   . Cardiac catheterization 2007    60% Stenosis mid-CFX  . Insert / replace / remove pacemaker     Allergies: No Known Allergies  Medications:   Prior to Admission medications   Medication Sig Start Date End Date Taking? Authorizing Provider  acetaminophen (TYLENOL EX ST ARTHRITIS PAIN) 500 MG tablet Take 500 mg by mouth every 6 (six) hours as needed. For pain    Yes Historical Provider, MD  albuterol (PROVENTIL HFA;VENTOLIN HFA) 108 (90 BASE) MCG/ACT inhaler Inhale 2 puffs into the lungs every 4 (four) hours as needed. For shortness of breath    Yes Historical Provider, MD  allopurinol (ZYLOPRIM) 300 MG tablet Take 300 mg by mouth daily.    Yes Historical Provider, MD  amiodarone (PACERONE) 200 MG tablet Take 200 mg by mouth daily.     Yes Historical Provider, MD  aspirin 81 MG chewable tablet Chew 81 mg by mouth daily.     Yes Historical Provider, MD  benazepril (LOTENSIN) 40 MG tablet Take 40 mg by mouth daily.     Yes Historical Provider, MD  budesonide-formoterol (SYMBICORT) 160-4.5 MCG/ACT inhaler Inhale 2 puffs into the lungs 2 (two) times daily.     Yes  Historical Provider, MD  carvedilol (COREG) 25 MG tablet Take 25 mg by mouth 2 (two) times daily with a meal.     Yes Historical Provider, MD  fenofibrate micronized (LOFIBRA) 134 MG capsule Take 134 mg by mouth daily.    Yes Historical Provider, MD  furosemide (LASIX) 40 MG tablet Take 40 mg by mouth daily.    Yes Historical Provider, MD  guaiFENesin (MUCINEX) 600 MG 12 hr tablet Take 600 mg by mouth 2 (two) times daily.     Yes Historical Provider, MD  isosorbide mononitrate (IMDUR) 30 MG 24 hr tablet Take 30 mg by mouth daily.     Yes Historical Provider, MD  levothyroxine (SYNTHROID, LEVOTHROID) 100 MCG tablet Take 100 mcg by mouth daily.    Yes Historical Provider, MD  loratadine (CLARITIN) 10 MG tablet Take 10 mg by mouth daily.     Yes Historical Provider, MD  potassium chloride SA  (K-DUR,KLOR-CON) 20 MEQ tablet Take 20 mEq by mouth daily.    Yes Historical Provider, MD  warfarin (COUMADIN) 5 MG tablet Take 5 mg by mouth daily.    Yes Historical Provider, MD    Discontinued Meds:   Medications Discontinued During This Encounter  Medication Reason  . albuterol (PROVENTIL) (5 MG/ML) 0.5% nebulizer solution Returned to ADS  . aspirin 81 MG tablet Dose change  . albuterol (PROVENTIL HFA;VENTOLIN HFA) 108 (90 BASE) MCG/ACT inhaler Patient has not taken in last 30 days  . Fluticasone-Salmeterol (ADVAIR DISKUS) 250-50 MCG/DOSE AEPB Patient has not taken in last 30 days  . levofloxacin (LEVAQUIN) IVPB 500 mg   . norepinephrine (LEVOPHED) 4 mg in dextrose 5 % 250 mL infusion   . vancomycin (VANCOCIN) IVPB 1000 mg/200 mL premix Change in therapy  . vancomycin (VANCOCIN) 1,500 mg in sodium chloride 0.9 % 500 mL IVPB   . furosemide (LASIX) injection 40 mg   . oseltamivir (TAMIFLU) 6 MG/ML suspension 75 mg   . norepinephrine (LEVOPHED) 4 mg in dextrose 5 % 250 mL infusion   . cefTRIAXone (ROCEPHIN) 2 g in dextrose 5 % 50 mL IVPB   . cefTAZidime (FORTAZ) 1 g in dextrose 5 % 50 mL IVPB Dose change  . vancomycin (VANCOCIN) 1,750 mg in sodium chloride 0.9 % 500 mL IVPB   . midazolam (VERSED) 1 mg/mL in sodium chloride 0.9 % 50 mL infusion     Social History:  reports that he has never smoked. He has never used smokeless tobacco. He reports that he drinks alcohol. He reports that he does not use illicit drugs.  Family History:   Family History  Problem Relation Age of Onset  . Heart disease Other     uncle  . Diabetes Other     uncle    Review of systems not obtained due to patient factors.  Blood pressure 95/56, pulse 92, temperature 98.9 F (37.2 C), temperature source Oral, resp. rate 20, height 5\' 6"  (1.676 m), weight 128.3 kg (282 lb 13.6 oz), SpO2 95.00%. General appearance: mild distress and moderately obese Head: Normocephalic, without obvious abnormality,  atraumatic Eyes: no icterus Neck: no adenopathy, no carotid bruit, no JVD, supple, symmetrical, trachea midline and thyroid not enlarged, symmetric, no tenderness/mass/nodules Resp: diminished breath sounds bibasilar and occ exp wheezes Cardio: irregularly irregular rhythm and no rub GI: distended abdomen, hypoactive BS Extremities: extremities normal, atraumatic, no cyanosis or edema  Labs: Basic Metabolic Panel:  Lab 06/08/11 4540 06/07/11 0500 06/06/11 1600 06/06/11 1120  NA 129* 127* 125*  129*  K 4.5 4.3 4.7 4.4  CL 96 97 95* 95*  CO2 25 23 24  --  GLUCOSE 106* 87 159* 150*  BUN 37* 25* 21 25*  CREATININE 3.95* 2.04* 1.04 1.10  ALB -- -- -- --  CALCIUM 8.4 8.3* 8.7 --  PHOS -- 2.6 -- --   Liver Function Tests:  Lab 06/06/11 1600  AST 14  ALT 17  ALKPHOS 56  BILITOT 1.1  PROT 6.0  ALBUMIN 3.1*   No results found for this basename: LIPASE:3,AMYLASE:3 in the last 168 hours No results found for this basename: AMMONIA:3 in the last 168 hours CBC:  Lab 06/08/11 0430 06/07/11 0500 06/06/11 2141 06/06/11 1600  WBC 11.7* 11.3* 9.6 13.1*  NEUTROABS -- -- -- 10.7*  HGB 13.2 14.3 14.6 15.2  HCT 39.8 41.9 41.6 43.1  MCV 86.1 85.2 84.2 83.9  PLT 207 176 199 293   PT/INR: @labrcntip (inr:5) Cardiac Enzymes:  Lab 06/07/11 0930 06/07/11 0430 06/06/11 1600 06/06/11 1044  CKTOTAL 23 29 32 39  CKMB 2.8 3.5 3.6 3.7  CKMBINDEX -- -- -- --  TROPONINI <0.30 <0.30 <0.30 <0.30   CBG:  Lab 06/08/11 0741 06/08/11 0441 06/08/11 0009 06/07/11 2006 06/07/11 1530  GLUCAP 110* 109* 111* 97 90    Iron Studies: No results found for this basename: IRON:30,TIBC:30,TRANSFERRIN:30,FERRITIN:30 in the last 168 hours  Xrays/Other Studies: US Renal Port  Dec 06, 202012  *RADIOLOGY REPORT*  Clinical Data: Renal insufficiency.  History of right nephrectomy. Question hydronephrosis.  PORTABLE RENAL/URINARY TRACT ULTRASOUND COMPLETE  Comparison:  Abdominal pelvic CT 03/04/2009.  Findings:  Right  Kidney:  Surgically absent.  Left Kidney:  The renal sinus fat is prominent.  There is possible mild cortical thinning.  No hydronephrosis or focal cortical abnormality is identified.  Renal length is 14.3 cm.  Bladder:  Not imaged.  The patient reportedly has a Foley catheter.  IMPRESSION:  1.  Solitary left kidney demonstrates no hydronephrosis.  There is possible mild cortical thinning, although the renal size is normal. 2.  Bladder not imaged.  Original Report Authenticated By: Gerrianne Scale, M.D.   Dg Chest Port 1 View  06/08/2011  *RADIOLOGY REPORT*  Clinical Data: Ventilator support.  Endotracheal placement.  PORTABLE CHEST - 1 VIEW  Comparison: Dec 06, 202012  Findings: Endotracheal tube has its tip to centimeters above the carina.  Pacemaker/AICD remains in place.  Nasogastric tube enters the abdomen.  Right internal jugular line has its tip in the SVC above the right atrium.  This film is not well penetrated.  Diffuse pulmonary infiltrates persist, similar allowing for technical differences.  IMPRESSION: No change.  Diffuse pulmonary infiltrates.  Today's film less well penetrated.  Original Report Authenticated By: Thomasenia Sales, M.D.   Dg Chest Port 1 View  Dec 06, 202012  *RADIOLOGY REPORT*  Clinical Data: Intubated  PORTABLE CHEST - 1 VIEW  Comparison: 06/06/2011  Findings: Endotracheal tube is unchanged in position. Cardiomegaly again noted.  Stable dual lead cardiac pacemaker position.  Stable right IJ central line position.  There is worsening in aeration with worsening bilateral edema or pneumonia right greater than left.  Probable bilateral small pleural effusion with bilateral basilar atelectasis or infiltrate.  IMPRESSION: Support apparatus. There is worsening in aeration with worsening bilateral edema or pneumonia right greater than left.  Probable bilateral small pleural effusion with bilateral basilar atelectasis or infiltrate.  Original Report Authenticated By: Natasha Mead, M.D.   Dg  Chest Portable 1 View  06/06/2011  *RADIOLOGY REPORT*  Clinical Data: Post intubation.  Right line placement  PORTABLE CHEST - 1 VIEW  Comparison: Chest radiograph 06/06/2011 at 10:57 hours  Findings: Endotracheal tube tip is approximately 2.5 cm above the carina.  Right IJ central venous catheter terminates over the distal superior vena cava, slightly obscured by the patient's AICD leads.  Cardiomegaly and prominent mediastinal contours are stable. There is diffuse pulmonary vascular congestion and bilateral airspace disease.  Bilateral pleural effusions are suspected. Negative for pneumothorax.  IMPRESSION:  1.  Negative for pneumothorax. 2.  Right IJ central venous catheter terminates in the distal superior vena cava. 3.  Endotracheal tube tip is 2.5 cm above the carina. 4.  No change in probable congestive heart failure.  Original Report Authenticated By: Britta Mccreedy, M.D.   Dg Chest Portable 1 View  06/06/2011  *RADIOLOGY REPORT*  Clinical Data: Shortness of breath.  PORTABLE CHEST - 1 VIEW  Comparison: 06/05/2011 and 11/26/2010.  Findings: 1057 hours.  Current examination is limited by the low lung volumes, motion and overlap of the patient's mandible with the lung apices.  The left subclavian AICD leads appear stable.  There is progressive cardiomegaly with worsening perihilar and lower lobe air space opacities most consistent with edema.  There are probable bilateral pleural effusions.  No pneumothorax is identified.  IMPRESSION: Probable congestive heart failure with worsening edema and bilateral pleural effusions.  Aspiration pneumonia could have a similar appearance.  Original Report Authenticated By: Gerrianne Scale, M.D.     Assessment/Plan: 1.  oliguric AKI- multifactorial:  pt with solitary kidney, now with sepsis, hypotension, pressors, in setting of ACE-I as well as possible BOO s/p foley placed by urology.  UOP is starting to pick up.  No indication for HD at this time but will  continue to follow closely and continue to hold ACE or any RAS-blocking agent. 2. PNA/Sepsis- per sepsis protocol 3. VDRF- per PCCM 4. Dilated CMP- cards to eval 5. Afib- on coumadin and rate controlled 6. Ecchymosis- noted by EP a week prior to admission, normal platelets, likely due to asa and coumadin 7. WCT s/p AICD 8. Solitary kidney- according to records dating from 2007 in echart, had nx due to nonfunctioning kidney and accelerated HTN.    Jillana Selph,Neel A 06/08/2011, 10:13 AM

## 2011-06-08 NOTE — Progress Notes (Signed)
INITIAL ADULT NUTRITION ASSESSMENT Date: 06/08/2011   Time: 8:46 AM  Reason for Assessment: Consult  ASSESSMENT: Male 65 y.o.  Dx:  Patient Active Problem List  Diagnoses  . 4098 Lead  . Atrial fibrillation  . Implantable Defibrillator  . Dilated cardiomyopathy  . Ecchymosis  . Chronic systolic heart failure  . Ventricular tachycardia  . Acute respiratory failure  . CAP (community acquired pneumonia)  . Hypercarbia  . Encephalopathy acute  . Warfarin-induced coagulopathy  . Hypothyroid  . Asthma  . Sepsis    Hx:  Past Medical History  Diagnosis Date  . Pneumonia, community acquired     bilateral bibasilar   . COPD exacerbation   . Atrial fibrillation   . Hypertension   . OSA (obstructive sleep apnea)     declines mask  . Depression   . Gouty arthritis   . CAD (coronary artery disease)     predominantly single vessel  . Ventricular tachycardia     s/p defibrillator-Medtronic EnTrust [id number]  . Dilated cardiomyopathy     s/p defibrillator Medtronic EnTrust [phone]  . Asthma   . 4782 Lead   . Implantable Defibrillator     Medtronic Entrust  . Chronic systolic heart failure     Related Meds:     . albuterol-ipratropium  6 puff Inhalation Q4H  . antiseptic oral rinse  15 mL Mouth Rinse QID  . cefTRIAXone (ROCEPHIN)  IV  2 g Intravenous Q24H  . chlorhexidine  15 mL Mouth/Throat BID  . furosemide  60 mg Intravenous Once  . moxifloxacin  400 mg Intravenous Q24H  . pantoprazole (PROTONIX) IV  40 mg Intravenous Q24H  . sodium chloride      . vancomycin  1,750 mg Intravenous Q24H  . DISCONTD: furosemide  40 mg Intravenous Once  . DISCONTD: oseltamivir  75 mg Per Tube Q12H  . DISCONTD: vancomycin  1,500 mg Intravenous Q12H     Ht: 5\' 6"  (167.6 cm)  Wt: 282 lb 13.6 oz (128.3 kg)  Ideal Wt: 64.5 kg % Ideal Wt: 199%  Usual Wt: 276 lbs on 12/13 % Usual Wt: 102%  Body mass index is 45.65 kg/(m^2).  Food/Nutrition Related Hx: unable to obtain, no family  present.   Labs:  CMP     Component Value Date/Time   NA 129* 06/08/2011 0430   K 4.5 06/08/2011 0430   CL 96 06/08/2011 0430   CO2 25 06/08/2011 0430   GLUCOSE 106* 06/08/2011 0430   BUN 37* 06/08/2011 0430   CREATININE 3.95* 06/08/2011 0430   CALCIUM 8.4 06/08/2011 0430   PROT 6.0 06/06/2011 1600   ALBUMIN 3.1* 06/06/2011 1600   AST 14 06/06/2011 1600   ALT 17 06/06/2011 1600   ALKPHOS 56 06/06/2011 1600   BILITOT 1.1 06/06/2011 1600   GFRNONAA 14* 06/08/2011 0430   GFRAA 17* 06/08/2011 0430      Intake/Output Summary (Last 24 hours) at 06/08/11 0849 Last data filed at 06/08/11 0700  Gross per 24 hour  Intake 3185.53 ml  Output    293 ml  Net 2892.53 ml     Diet Order: NPO  Supplements/Tube Feeding: n/a  IVF:    sodium chloride Last Rate: 10 mL/hr at 06/06/11 1400  fentaNYL infusion INTRAVENOUS Last Rate: 50 mcg/hr (06/07/11 0000)  midazolam (VERSED) infusion Last Rate: 2 mg/hr (06/07/11 1300)  norepinephrine (LEVOPHED) Adult infusion Last Rate: 6 mcg/min (06/08/11 0600)  DISCONTD: norepinephrine (LEVOPHED) Adult infusion Last Rate: 10 mcg/min (06/07/11 2100)  Estimated Nutritional Needs:   Kcal: 2012 Protein: 135 gm Fluid: 2 L  Patient is on ventilator.  BMI meets criteria for morbid obesity.   NUTRITION DIAGNOSIS: -Inadequate oral intake (NI-2.1).  Status: Ongoing  RELATED TO: inability to eat  AS EVIDENCE BY: NPO status  MONITORING/EVALUATION(Goals): Goal: Enteral nutrition to provide 60-70% of estimated calorie needs (22-25 kcals/kg ideal body weight) and 100% of estimated protein needs, based on ASPEN guidelines for permissive underfeeding in critically ill obese individuals. Monitor: TF tolerance, weight, labs, I/O's  EDUCATION NEEDS: -No education needs identified at this time  INTERVENTION: 1.Change Nepro via OG tube, begin at 15 ml/hr and advance by 10 ml every 4 hours to a goal rate of 25 ml/hr.  2. Give Pro-stat 5 times daily via  OG tube --This will provide 1440 kcal, 124 gm protein, and 436 ml free water.     Dietitian [phone]  DOCUMENTATION CODES Per approved criteria  -Morbid Obesity    Cody Faulkner 06/08/2011, 8:46 AM

## 2011-06-08 NOTE — Progress Notes (Signed)
ANTICOAGULATION & Antibiotic CONSULT NOTE - Follow Up Consult  Pharmacy Consult for Heparin/Warfarin; Vancomycin Indication: atrial fibrillation, empiric pneumonia  No Known Allergies  Admit Complaint: 65 yo M admitted from home with respiratory failure and possible pneumonia   Pharmacist System-Based Medication Review:   Anticoagulation: Afib, DVT Prophylaxsis, INR up 3.01, no  bleeding noted, Hgb down to 13.2, likely dilutional. Continue to hold anticoagulation, consider 2.5 mg vitamin K PO given persistently high INR . Infectious Disease: CAP: D#2 vancomycin, avelox, ceftriaxone. Cultures 12/29 - trach, blood-pending, flu-negative ; WBC 11.7, Afebrile,  Vancomycin changed to 1750 mg IV q24h on 12/30 for renal function. PCT ordered for tomorrow am to help narrow antibiotics. Changing from ceftriaxone to [place].  Cardiovascular: Afib, CAD, VT (with ICD), CHF -septic shock on levophed at 6,  follow up resume of home medications when clinically indicated, Endocrinology: Gout, resume allopurinol when clinically indicated.  Gastrointestinal / Nutrition: NPO  Neurology: Depression, not on therapy PTA, Sedated on vent with PRN fent/versed  Nephrology: 1 kidney, CrCl 23 ml/min (SCr doubled), difficult foley placement, done by urology, volume overloaded, minimal UOP (~0.1cc/kg/hr) Pulmonary: COPD, OSA, Asthma, on vent PRVC 20/510/5 50% FIO2  PTA Medication Issues: Not yet resumed: amiodarone 200 daily, aspirin 81, benazepril 40 daily, cored 25 bid, lasix 40 daily, IMDUR 30 daily, levothyroxine 100 daily, Allopurinol 300 mg daily, med history from cousins who seemed to know what he was doing, said he was taking everything (? Compliance)  Best Practices: IV protonix, heparin/warfarin currently INR supertherapeutic, follow up initiation of ICU mouth care protocol  Goal of Therapy:  Vancomycin 15-20 mcg/ml, Heparin level 0.3-0.7, while INR < goal.  PLAN: 1. Secondary to Scr doubling, will further  adjust Vancomycin to 1750 mg IV q48h-Next dose 1/2.  2. D/C Rocephin. Start Fortaz 1g IV q24h. Follow-up UOP/renal plan. Note 1 kidney.  2. No warfarin or heparin given INR, consider 2.5 mg PO vitamin K given persistently elevated INR unless otherwise clinically indicated.  Patient Measurements: Height: 5\' 6"  (167.6 cm) Weight: 282 lb 13.6 oz (128.3 kg) IBW/kg (Calculated) : 63.8   Medications:  Prescriptions prior to admission   Medication  Sig  Dispense  Refill   .  acetaminophen (TYLENOL EX ST ARTHRITIS PAIN) 500 MG tablet  Take 500 mg by mouth every 6 (six) hours as needed. For pain     .  albuterol (PROVENTIL HFA;VENTOLIN HFA) 108 (90 BASE) MCG/ACT inhaler  Inhale 2 puffs into the lungs every 4 (four) hours as needed. For shortness of breath     .  allopurinol (ZYLOPRIM) 300 MG tablet  Take 300 mg by mouth daily.     Marland Kitchen  amiodarone (PACERONE) 200 MG tablet  Take 200 mg by mouth daily.     Marland Kitchen  aspirin 81 MG chewable tablet  Chew 81 mg by mouth daily.     .  benazepril (LOTENSIN) 40 MG tablet  Take 40 mg by mouth daily.     .  budesonide-formoterol (SYMBICORT) 160-4.5 MCG/ACT inhaler  Inhale 2 puffs into the lungs 2 (two) times daily.     .  carvedilol (COREG) 25 MG tablet  Take 25 mg by mouth 2 (two) times daily with a meal.     .  fenofibrate micronized (LOFIBRA) 134 MG capsule  Take 134 mg by mouth daily.     .  furosemide (LASIX) 40 MG tablet  Take 40 mg by mouth daily.     Marland Kitchen  guaiFENesin (MUCINEX) 600 MG  12 hr tablet  Take 600 mg by mouth 2 (two) times daily.     .  isosorbide mononitrate (IMDUR) 30 MG 24 hr tablet  Take 30 mg by mouth daily.     Marland Kitchen  levothyroxine (SYNTHROID, LEVOTHROID) 100 MCG tablet  Take 100 mcg by mouth daily.     Marland Kitchen  loratadine (CLARITIN) 10 MG tablet  Take 10 mg by mouth daily.     .  potassium chloride SA (K-DUR,KLOR-CON) 20 MEQ tablet  Take 20 mEq by mouth daily.     Marland Kitchen  warfarin (COUMADIN) 5 MG tablet  Take 5 mg by mouth daily.        Vital Signs: Temp:  98.9 F (37.2 C) (12/31 0742) Temp src: Oral (12/31 0742) BP: 95/56 mmHg (12/31 0807) Pulse Rate: 92  (12/31 0807)  Labs:  Basename 06/08/11 0430 06/07/11 1302 06/07/11 0930 06/07/11 0500 06/07/11 0430 06/06/11 2141 06/06/11 1600  HGB 13.2 -- -- 14.3 -- -- --  HCT 39.8 -- -- 41.9 -- 41.6 --  PLT 207 -- -- 176 -- 199 --  APTT -- -- -- -- -- -- 63*  LABPROT 31.7* 58.3* -- 63.2* -- -- --  INR 3.01* 6.57* -- 7.28* -- -- --  HEPARINUNFRC -- -- -- -- -- -- --  CREATININE 3.95* -- -- 2.04* -- -- 1.04  CKTOTAL -- -- 23 -- 29 -- 32  CKMB -- -- 2.8 -- 3.5 -- 3.6  TROPONINI -- -- <0.30 -- <0.30 -- <0.30   Estimated Creatinine Clearance: 23 ml/min (by C-G formula based on Cr of 3.95).   Cody Faulkner 06/08/2011,8:51 AM

## 2011-06-09 ENCOUNTER — Inpatient Hospital Stay (HOSPITAL_COMMUNITY): Payer: Medicare Other

## 2011-06-09 DIAGNOSIS — E872 Acidosis, unspecified: Secondary | ICD-10-CM

## 2011-06-09 DIAGNOSIS — J9600 Acute respiratory failure, unspecified whether with hypoxia or hypercapnia: Secondary | ICD-10-CM

## 2011-06-09 DIAGNOSIS — R4182 Altered mental status, unspecified: Secondary | ICD-10-CM

## 2011-06-09 DIAGNOSIS — I509 Heart failure, unspecified: Secondary | ICD-10-CM

## 2011-06-09 LAB — COMPREHENSIVE METABOLIC PANEL
Albumin: 2.4 g/dL — ABNORMAL LOW (ref 3.5–5.2)
BUN: 48 mg/dL — ABNORMAL HIGH (ref 6–23)
Calcium: 8.3 mg/dL — ABNORMAL LOW (ref 8.4–10.5)
Creatinine, Ser: 4.32 mg/dL — ABNORMAL HIGH (ref 0.50–1.35)
GFR calc Af Amer: 15 mL/min — ABNORMAL LOW (ref >90)
Glucose, Bld: 118 mg/dL — ABNORMAL HIGH (ref 70–99)
Total Protein: 5.4 g/dL — ABNORMAL LOW (ref 6.0–8.3)

## 2011-06-09 LAB — PROCALCITONIN: Procalcitonin: 2.68 ng/mL

## 2011-06-09 LAB — GLUCOSE, CAPILLARY
Glucose-Capillary: 106 mg/dL — ABNORMAL HIGH (ref 70–99)
Glucose-Capillary: 126 mg/dL — ABNORMAL HIGH (ref 70–99)
Glucose-Capillary: 98 mg/dL (ref 70–99)

## 2011-06-09 LAB — CBC
Hemoglobin: 12.9 g/dL — ABNORMAL LOW (ref 13.0–17.0)
MCH: 28.9 pg (ref 26.0–34.0)
MCHC: 33.5 g/dL (ref 30.0–36.0)
MCV: 86.1 fL (ref 78.0–100.0)
RBC: 4.47 MIL/uL (ref 4.22–5.81)

## 2011-06-09 LAB — DIFFERENTIAL
Basophils Relative: 0 % (ref 0–1)
Eosinophils Absolute: 0.1 10*3/uL (ref 0.0–0.7)
Eosinophils Relative: 1 % (ref 0–5)
Lymphs Abs: 0.8 10*3/uL (ref 0.7–4.0)
Monocytes Relative: 11 % (ref 3–12)
Neutrophils Relative %: 81 % — ABNORMAL HIGH (ref 43–77)

## 2011-06-09 LAB — CULTURE, RESPIRATORY W GRAM STAIN

## 2011-06-09 LAB — PROTIME-INR: Prothrombin Time: 37.2 seconds — ABNORMAL HIGH (ref 11.6–15.2)

## 2011-06-09 LAB — APTT: aPTT: 67 seconds — ABNORMAL HIGH (ref 24–37)

## 2011-06-09 MED ORDER — FUROSEMIDE 10 MG/ML IJ SOLN
INTRAMUSCULAR | Status: AC
  Filled 2011-06-09: qty 8

## 2011-06-09 MED ORDER — FUROSEMIDE 10 MG/ML IJ SOLN
80.0000 mg | Freq: Two times a day (BID) | INTRAMUSCULAR | Status: DC
  Administered 2011-06-09 (×2): 80 mg via INTRAVENOUS
  Filled 2011-06-09 (×3): qty 8

## 2011-06-09 NOTE — Progress Notes (Signed)
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 He was acute hypercarbic and hypoxic respiratory failure on arrival.  Lines / Drains: OETT 12/29>>> Right IJ CVL 12/29>>> R radial a-line 12/29>>>12/30  Cultures: Sputum 12/29>>>nf BC X2 12/29>>>> procalcitonin 12/29>>>> Lactate 12/29>>> Influenza pcr 12/29>>>negative Urine strep 12/30>>> Urine legionella 12/30>>>  Antibiotics: Avelox (CAP)12/29>>> Rocephin (CAP)12/29>>>12/31 ceftaz 12/31>>> Vanc (CAP) 12/29>>> tamiflu 12/29>>>off  Tests / Events: Echo 12/29>>>poor acoustic windows. Very hard to see anything. Definity contrast was used. I think that the LV systolic function is normal. The RV appears mildly dilated with probably normal systolic function. Dilated IVC suggests elevated RV filling pressure.   Subjective/interval history Urine output increased, weaning on 5/5  Vital Signs: Temp:  [98.3 F (36.8 C)-99 F (37.2 C)] 98.7 F (37.1 C) (01/01 0746) Pulse Rate:  [81-103] 90  (01/01 0823) Resp:  [19-30] 23  (01/01 0823) BP: (91-130)/(48-93) 130/90 mmHg (01/01 0823) SpO2:  [92 %-98 %] 94 % (01/01 0823) FiO2 (%):  [39.7 %-99.5 %] 40 % (01/01 0823) CVP: 12    . sodium chloride 10 mL/hr at 06/06/11 1400  . fentaNYL infusion INTRAVENOUS 50 mcg/hr (06/07/11 0000)  . norepinephrine (LEVOPHED) Adult infusion 6 mcg/min (06/08/11 0600)   I/O last 3 completed shifts: In: 2453.1 [I.V.:1368.1; NG/GT:285; IV Piggyback:800] Out: 813 [Urine:813]  Physical Examination: General:  rass -1, follows commands Neuro:  Sedated, opens eyes, perrl  HEENT:  jvd up Cardiovascular:  Rrr, dist s1s2/ afib  Lungs:  Rhonchi diffuse Abdomen:  Soft, NT Positive BS Musculoskeletal:  Increased edema, scattered areas  of echymosis  Ventilator settings: Vent Mode:  [-] PSV FiO2 (%):  [39.7 %-99.5 %] 40 % Set Rate:  [20 bmp] 20 bmp Vt Set:  [510 mL] 510 mL PEEP:  [5 cmH20] 5 cmH20 Pressure Support:  [5 cmH20-8 cmH20] 5 cmH20 Plateau Pressure:  [20 cmH20-22 cmH20] 20 cmH20  Labs and Imaging:   Lab 06/09/11 0500 06/08/11 1227 06/08/11 0430  NA 134* 132* 129*  K 4.0 4.6 4.5  CL 101 99 96  CO2 24 23 25   BUN 48* 39* 37*  CREATININE 4.32* 4.05* 3.95*  GLUCOSE 118* 99 106*    Lab 06/09/11 0500 06/08/11 0430 06/07/11 0500  HGB 12.9* 13.2 14.3  HCT 38.5* 39.8 41.9  WBC 9.8 11.7* 11.3*  PLT 187 207 176   Lab Results  Component Value Date   INR 3.69* 06/09/2011   INR 3.01* 06/08/2011   INR 6.57* 2020/01/1811   ABG    Component Value Date/Time   PHART 7.392 06/08/2011 0515   PCO2ART 40.4 06/08/2011 0515   PO2ART 92.5 06/08/2011 0515   HCO3 24.0 06/08/2011 0515   TCO2 25.3 06/08/2011 0515   ACIDBASEDEF 0.3 06/08/2011 0515   O2SAT 97.7 06/08/2011 0515  PCXR:  Increased right > left airspace disease.    Assessment and Plan:   Acute respiratory failure with combined hypoxic and  Hypercarbic respiratory failure in  Setting of CAP (community acquired pneumonia), now progressive bilateral pulmonary infiltrates R>L. Most likely ALI, but could be element of edema.  Plan: - weaning cpap 5 ps 5, likely not to extubate Would require neg balance and follow up pcxr pcxr in am  lasix  Sepsis 2/2 CAP (NOS)  Lab 06/09/11 0500 06/08/11 0430  PROCALCITON 2.68 3.07   Plan: -trend PCT to narrow abx today as remains culture neg All abx will contribute to pos balance Dc vanc -continue avelox, ceftaz In am if cult neg still dc ceftaz  Acute renal failure. H/o solitary kidney: in setting of shock Recent Labs  Basename 06/09/11 0500 06/08/11 1227 06/08/11 0430   CREATININE 4.32* 4.05* 3.95*  plan: - Keep as neg balance as goal Consider lasix Lasix dep at home pcxr increased edema Chem in am   kvo With increased output, hope crt will drop  Dilated cardiomyopathy, Chronic systolic heart failure, and  Atrial fibrillation Plan - Rate control - Coumadin per pharmacy when able Echo reviewed- poor images ef likely wnl Will call dr Donnie Aho in am to update on his status  Warfarin-induced coagulopathy Lab Results  Component Value Date   INR 3.69* 06/09/2011   INR 3.01* 06/08/2011   INR 6.57* 2020-08-2610  Plan - Trend, hold coumadin today again coags in am   Encephalopathy acute: due to hypercarbia +/1 sepsis Plan: - doing wel off verse drip Avoid benzo  Hypothyroid Plan: - Replace  Best practices / Disposition: -->ICU status under PCCM -->full code -->coumadin  for DVT Px and AFIB, held coags up -->Protonix for GI Px -->ventilator bundle -->diet: TF start 12/30 -->family updated at bedside  The patient is critically ill with multiple organ systems failure and requires high complexity decision making for assessment and support, frequent evaluation and titration of therapies, application of advanced monitoring technologies and extensive interpretation of multiple databases. Critical Care Time devoted to patient care services described in this note is 30 minutes.  Nelda Bucks 06/09/2011, 10:11 AM  Mcarthur Rossetti. Tyson Alias, MD, FACP Pgr: [phone] [facility]   Nelda Bucks., M.D. [phone]

## 2011-06-09 NOTE — Progress Notes (Signed)
S:pt awake and alert while intubated.  No complaints O:BP 130/90  Pulse 90  Temp(Src) 98.7 F (37.1 C) (Oral)  Resp 23  Ht 5\' 6"  (1.676 m)  Wt 128.3 kg (282 lb 13.6 oz)  BMI 45.65 kg/m2  SpO2 94%  Intake/Output Summary (Last 24 hours) at 06/09/11 0841 Last data filed at 06/09/11 0600  Gross per 24 hour  Intake 1303.6 ml  Output    675 ml  Net  628.6 ml   Weight change:  Gen:WD, WN, obese WM intubated but awake and comfortable CVS:RRR Resp:Scattered rhonchi Abd:+BS, soft, NT/ND BJY:NWGNF edema right hand, multiple ecchymoses   Lab 06/09/11 0500 06/08/11 1227 06/08/11 0430 06/07/11 0500 06/06/11 1600 06/06/11 1120  NA 134* 132* 129* 127* 125* 129*  K 4.0 4.6 4.5 4.3 4.7 4.4  CL 101 99 96 97 95* 95*  CO2 24 23 25 23 24  --  GLUCOSE 118* 99 106* 87 159* 150*  BUN 48* 39* 37* 25* 21 25*  CREATININE 4.32* 4.05* 3.95* 2.04* 1.04 1.10  ALB -- -- -- -- -- --  CALCIUM 8.3* 7.9* 8.4 8.3* 8.7 --  PHOS -- -- -- 2.6 -- --  AST 9 -- -- -- 14 --  ALT 10 -- -- -- 17 --   Liver Function Tests:  Lab 06/09/11 0500 06/06/11 1600  AST 9 14  ALT 10 17  ALKPHOS 50 56  BILITOT 0.6 1.1  PROT 5.4* 6.0  ALBUMIN 2.4* 3.1*   No results found for this basename: LIPASE:3,AMYLASE:3 in the last 168 hours No results found for this basename: AMMONIA:3 in the last 168 hours CBC:  Lab 06/09/11 0500 06/08/11 0430 06/07/11 0500 06/06/11 2141 06/06/11 1600  WBC 9.8 11.7* 11.3* -- --  NEUTROABS 7.9* -- -- -- 10.7*  HGB 12.9* 13.2 14.3 -- --  HCT 38.5* 39.8 41.9 -- --  MCV 86.1 86.1 85.2 84.2 83.9  PLT 187 207 176 -- --   Cardiac Enzymes:  Lab 06/07/11 0930 06/07/11 0430 06/06/11 1600 06/06/11 1044  CKTOTAL 23 29 32 39  CKMB 2.8 3.5 3.6 3.7  CKMBINDEX -- -- -- --  TROPONINI <0.30 <0.30 <0.30 <0.30   CBG:  Lab 06/09/11 0736 06/09/11 0430 06/09/11 0027 06/08/11 1943 06/08/11 1606  GLUCAP 126* 98 98 91 91    Iron Studies: No results found for this basename: IRON,TIBC,TRANSFERRIN,FERRITIN  in the last 72 hours Studies/Results: US Renal Port  02/20/2011  *RADIOLOGY REPORT*  Clinical Data: Renal insufficiency.  History of right nephrectomy. Question hydronephrosis.  PORTABLE RENAL/URINARY TRACT ULTRASOUND COMPLETE  Comparison:  Abdominal pelvic CT 03/04/2009.  Findings:  Right Kidney:  Surgically absent.  Left Kidney:  The renal sinus fat is prominent.  There is possible mild cortical thinning.  No hydronephrosis or focal cortical abnormality is identified.  Renal length is 14.3 cm.  Bladder:  Not imaged.  The patient reportedly has a Foley catheter.  IMPRESSION:  1.  Solitary left kidney demonstrates no hydronephrosis.  There is possible mild cortical thinning, although the renal size is normal. 2.  Bladder not imaged.  Original Report Authenticated By: Gerrianne Scale, M.D.   Dg Chest Port 1 View  06/08/2011  *RADIOLOGY REPORT*  Clinical Data: Ventilator support.  Endotracheal placement.  PORTABLE CHEST - 1 VIEW  Comparison: 02/20/2011  Findings: Endotracheal tube has its tip to centimeters above the carina.  Pacemaker/AICD remains in place.  Nasogastric tube enters the abdomen.  Right internal jugular line has its tip in the SVC  above the right atrium.  This film is not well penetrated.  Diffuse pulmonary infiltrates persist, similar allowing for technical differences.  IMPRESSION: No change.  Diffuse pulmonary infiltrates.  Today's film less well penetrated.  Original Report Authenticated By: Thomasenia Sales, M.D.      . albuterol-ipratropium  6 puff Inhalation Q4H  . antiseptic oral rinse  15 mL Mouth Rinse QID  . cefTAZidime (FORTAZ)  IV  1 g Intravenous Q24H  . chlorhexidine  15 mL Mouth/Throat BID  . feeding supplement (NEPRO CARB STEADY)  1,000 mL Per Tube Q24H  . feeding supplement  30 mL Per Tube 5 X Daily  . moxifloxacin  400 mg Intravenous Q24H  . pantoprazole (PROTONIX) IV  40 mg Intravenous Q24H  . vancomycin  1,750 mg Intravenous Q48H  . DISCONTD: cefTAZidime  (FORTAZ)  IV  1 g Intravenous Q8H  . DISCONTD: cefTRIAXone (ROCEPHIN)  IV  2 g Intravenous Q24H  . DISCONTD: feeding supplement (NEPRO CARB STEADY)  1,000 mL Per Tube Q24H  . DISCONTD: vancomycin  1,750 mg Intravenous Q24H    BMET    Component Value Date/Time   NA 134* 06/09/2011 0500   K 4.0 06/09/2011 0500   CL 101 06/09/2011 0500   CO2 24 06/09/2011 0500   GLUCOSE 118* 06/09/2011 0500   BUN 48* 06/09/2011 0500   CREATININE 4.32* 06/09/2011 0500   CALCIUM 8.3* 06/09/2011 0500   GFRNONAA 13* 06/09/2011 0500   GFRAA 15* 06/09/2011 0500   CBC    Component Value Date/Time   WBC 9.8 06/09/2011 0500   RBC 4.47 06/09/2011 0500   HGB 12.9* 06/09/2011 0500   HCT 38.5* 06/09/2011 0500   PLT 187 06/09/2011 0500   MCV 86.1 06/09/2011 0500   MCH 28.9 06/09/2011 0500   MCHC 33.5 06/09/2011 0500   RDW 14.1 06/09/2011 0500   LYMPHSABS 0.8 06/09/2011 0500   MONOABS 1.0 06/09/2011 0500   EOSABS 0.1 06/09/2011 0500   BASOSABS 0.0 06/09/2011 0500     Assessment/Plan:  1. AKI- multifactorial: pt with solitary kidney, now with sepsis, hypotension, pressors, in setting of ACE-I as well as possible BOO s/p foley placed by urology. UOP continues to pick up. No indication for HD at this time but will continue to follow closely and continue to hold ACE or any RAS-blocking agent. 2. PNA/Sepsis- per sepsis protocol- improving 3. VDRF- per PCCM 4. Dilated CMP- cards to eval 5. Afib- on coumadin and rate controlled 6. Ecchymosis- noted by EP a week prior to admission, normal platelets, likely due to asa and coumadin 7. WCT s/p AICD 8. Solitary kidney- according to records dating from 2007 in echart, had nx due to nonfunctioning kidney and accelerated HTN.     Atoya Andrew,Johnmark A

## 2011-06-09 NOTE — Progress Notes (Signed)
ANTICOAGULATION CONSULT NOTE - Follow Up Consult  Pharmacy Consult for heparin/coumadin Indication: atrial fibrillation  No Known Allergies  Patient Measurements: Height: 5\' 6"  (167.6 cm) Weight: 282 lb 13.6 oz (128.3 kg) IBW/kg (Calculated) : 63.8    Vital Signs: Temp: 98.3 F (36.8 C) (01/01 1148) Temp src: Oral (01/01 1148) BP: 110/75 mmHg (01/01 1500) Pulse Rate: 84  (01/01 1500)  Labs:  Basename 06/09/11 0500 06/08/11 1227 06/08/11 0430 06/07/11 1302 06/07/11 0930 06/07/11 0500 06/07/11 0430 06/06/11 1600  HGB 12.9* -- 13.2 -- -- -- -- --  HCT 38.5* -- 39.8 -- -- 41.9 -- --  PLT 187 -- 207 -- -- 176 -- --  APTT 67* -- -- -- -- -- -- 63*  LABPROT 37.2* -- 31.7* 58.3* -- -- -- --  INR 3.69* -- 3.01* 6.57* -- -- -- --  HEPARINUNFRC -- -- -- -- -- -- -- --  CREATININE 4.32* 4.05* 3.95* -- -- -- -- --  CKTOTAL -- -- -- -- 23 -- 29 32  CKMB -- -- -- -- 2.8 -- 3.5 3.6  TROPONINI -- -- -- -- <0.30 -- <0.30 <0.30   Estimated Creatinine Clearance: 21 ml/min (by C-G formula based on Cr of 4.32).  Assessment:  INR trending up to 3.69. No bleeding reported.  No coumadin since admission. On 5mg  daily at home.  He has orders for both heparin and coumadin protocols.  INR supratherapeutic no not needed currently.  Unclear if invasive procedures planned. Goal of Therapy:  INR 2-3   Plan:  No heparin or coumadin today. If reversal needed would rec vtiamin k 2.5mg  PO or 1mg  IV.  Len Childs T 06/09/2011,3:40 PM

## 2011-06-10 ENCOUNTER — Encounter (HOSPITAL_COMMUNITY): Payer: Self-pay | Admitting: Cardiology

## 2011-06-10 ENCOUNTER — Inpatient Hospital Stay (HOSPITAL_COMMUNITY): Payer: Medicare Other

## 2011-06-10 DIAGNOSIS — I110 Hypertensive heart disease with heart failure: Secondary | ICD-10-CM | POA: Insufficient documentation

## 2011-06-10 DIAGNOSIS — G4733 Obstructive sleep apnea (adult) (pediatric): Secondary | ICD-10-CM | POA: Insufficient documentation

## 2011-06-10 DIAGNOSIS — Z7901 Long term (current) use of anticoagulants: Secondary | ICD-10-CM

## 2011-06-10 DIAGNOSIS — Q600 Renal agenesis, unilateral: Secondary | ICD-10-CM | POA: Insufficient documentation

## 2011-06-10 LAB — DIFFERENTIAL
Eosinophils Relative: 1 % (ref 0–5)
Lymphocytes Relative: 9 % — ABNORMAL LOW (ref 12–46)
Lymphs Abs: 0.9 10*3/uL (ref 0.7–4.0)
Monocytes Absolute: 1.3 10*3/uL — ABNORMAL HIGH (ref 0.1–1.0)
Monocytes Relative: 13 % — ABNORMAL HIGH (ref 3–12)

## 2011-06-10 LAB — CBC
HCT: 40 % (ref 39.0–52.0)
MCV: 87.9 fL (ref 78.0–100.0)
RBC: 4.55 MIL/uL (ref 4.22–5.81)
WBC: 10.3 10*3/uL (ref 4.0–10.5)

## 2011-06-10 LAB — RENAL FUNCTION PANEL
BUN: 62 mg/dL — ABNORMAL HIGH (ref 6–23)
Chloride: 99 mEq/L (ref 96–112)
Creatinine, Ser: 4.75 mg/dL — ABNORMAL HIGH (ref 0.50–1.35)
Glucose, Bld: 115 mg/dL — ABNORMAL HIGH (ref 70–99)
Potassium: 4.1 mEq/L (ref 3.5–5.1)

## 2011-06-10 LAB — GLUCOSE, CAPILLARY
Glucose-Capillary: 110 mg/dL — ABNORMAL HIGH (ref 70–99)
Glucose-Capillary: 86 mg/dL (ref 70–99)
Glucose-Capillary: 86 mg/dL (ref 70–99)

## 2011-06-10 LAB — POCT I-STAT 3, ART BLOOD GAS (G3+)
Bicarbonate: 26.5 mEq/L — ABNORMAL HIGH (ref 20.0–24.0)
O2 Saturation: 94 %
Patient temperature: 98.2
TCO2: 28 mmol/L (ref 0–100)

## 2011-06-10 MED ORDER — IPRATROPIUM BROMIDE 0.02 % IN SOLN
0.5000 mg | Freq: Four times a day (QID) | RESPIRATORY_TRACT | Status: DC
  Administered 2011-06-10 – 2011-06-14 (×18): 0.5 mg via RESPIRATORY_TRACT
  Filled 2011-06-10 (×19): qty 2.5

## 2011-06-10 MED ORDER — FUROSEMIDE 10 MG/ML IJ SOLN
80.0000 mg | Freq: Every day | INTRAMUSCULAR | Status: DC
  Filled 2011-06-10: qty 8

## 2011-06-10 MED ORDER — ALBUTEROL SULFATE (5 MG/ML) 0.5% IN NEBU
2.5000 mg | INHALATION_SOLUTION | Freq: Four times a day (QID) | RESPIRATORY_TRACT | Status: DC
  Administered 2011-06-10 – 2011-06-14 (×18): 2.5 mg via RESPIRATORY_TRACT
  Filled 2011-06-10 (×19): qty 0.5

## 2011-06-10 NOTE — Consult Note (Signed)
Admit date: 06/06/2011 Name: Cody Faulkner DOB:  August 22, 1945 MRN:  [id number] Age: 66 y.o.   Referring Physician:   Dr. Rory Percy Primary Physician:    Dr. Burnell Blanks Primary Cardiologist:    Dr. Viann Fish  Reason for Consultation:    Respiratory failure, atrial fibrillation  HPI:     This 66 year old is well known to me. He has a history of severe morbid obesity and has a history of atrial fibrillation and ventricular tachycardia diagnosed in 2007. At the time he was found to have a cardiomyopathy and had only moderate coronary artery disease noted. He was placed on amiodarome and also was on warfarin and has done fairly well since that time. His ejection fraction had improved to the best that we can tell. He recently developed an upper respiratory infection and was treated with amoxicillin by his family physician. He had gone back into atrial fibrillation and there was some concern as to whether he had had an interval inferior infarction. I was asked to see the patient and saw him in the office on December 28 and at the time increased his furosemide to twice daily and asked him to increase his amiodarone to twice daily thinking that he might need to have a cardioversion. He continued on amoxicillin and collapsed on Saturday and was intubated and brought in with acute respiratory failure. He has recurrent pressor support and has also developed acute renal failure with creatinine rising to 4.75 today. His creatinine was normal when seen in the office on Friday and his BNP level was not really that elevated. He is currently extubated and is still coughing.   Past Medical History  Diagnosis Date  . Pneumonia, community acquired     bilateral bibasilar   . COPD exacerbation   . Campath-induced atrial fibrillation     Cardioversion 2007  . Hypertension   . OSA (obstructive sleep apnea)     declines mask  . Depression   . Gouty arthritis   . CAD (coronary artery disease)    predominantly single vessel  . Ventricular tachycardia     s/p defibrillator-Medtronic EnTrust [id number]  . Dilated cardiomyopathy     s/p defibrillator Medtronic EnTrust [phone]  . Asthma   . 7846 Lead   . Implantable Defibrillator     Medtronic Entrust  . Chronic systolic heart failure   . Hypothyroidism   . Acute renal failure 06/06/2011  . Solitary kidney 06/05/2006      Past Surgical History  Procedure Date  . Cardiac defibrillator placement 2007    Medtronic EnTrust [phone]  . Nephrectomy 1976    Right  . Arthroscopic knee surgery   . Cardiac catheterization 2007    60% Stenosis mid-CFX    Allergies:   has no known allergies.   Medications: Prior to Admission medications   Medication Sig Start Date End Date Taking? Authorizing Provider  acetaminophen (TYLENOL EX ST ARTHRITIS PAIN) 500 MG tablet Take 500 mg by mouth every 6 (six) hours as needed. For pain    Yes Historical Provider, MD  albuterol (PROVENTIL HFA;VENTOLIN HFA) 108 (90 BASE) MCG/ACT inhaler Inhale 2 puffs into the lungs every 4 (four) hours as needed. For shortness of breath    Yes Historical Provider, MD  allopurinol (ZYLOPRIM) 300 MG tablet Take 300 mg by mouth daily.    Yes Historical Provider, MD  amiodarone (PACERONE) 200 MG tablet Take 200 mg by mouth daily.     Yes Historical Provider, MD  aspirin 81  MG chewable tablet Chew 81 mg by mouth daily.     Yes Historical Provider, MD  benazepril (LOTENSIN) 40 MG tablet Take 40 mg by mouth daily.     Yes Historical Provider, MD  budesonide-formoterol (SYMBICORT) 160-4.5 MCG/ACT inhaler Inhale 2 puffs into the lungs 2 (two) times daily.     Yes Historical Provider, MD  carvedilol (COREG) 25 MG tablet Take 25 mg by mouth 2 (two) times daily with a meal.     Yes Historical Provider, MD  fenofibrate micronized (LOFIBRA) 134 MG capsule Take 134 mg by mouth daily.    Yes Historical Provider, MD  furosemide (LASIX) 40 MG tablet Take 40 mg by mouth daily.    Yes Historical  Provider, MD  guaiFENesin (MUCINEX) 600 MG 12 hr tablet Take 600 mg by mouth 2 (two) times daily.     Yes Historical Provider, MD  isosorbide mononitrate (IMDUR) 30 MG 24 hr tablet Take 30 mg by mouth daily.     Yes Historical Provider, MD  levothyroxine (SYNTHROID, LEVOTHROID) 100 MCG tablet Take 100 mcg by mouth daily.    Yes Historical Provider, MD  loratadine (CLARITIN) 10 MG tablet Take 10 mg by mouth daily.     Yes Historical Provider, MD  potassium chloride SA (K-DUR,KLOR-CON) 20 MEQ tablet Take 20 mEq by mouth daily.    Yes Historical Provider, MD  warfarin (COUMADIN) 5 MG tablet Take 5 mg by mouth daily.    Yes Historical Provider, MD    Family History:  family history includes Diabetes in his other and Heart disease in his other.  Social History:   reports that he has never smoked. He has never used smokeless tobacco. He reports that he drinks alcohol. He reports that he does not use illicit drugs.  Review of Systems: He has significant arthritis involving both knees. He has a history of gout and also has a solitary kidney due to nonfunctioning kidney in the past.  Other than as noted above the remainder of the review of systems is unremarkable. Physical Exam: Blood pressure 102/74, pulse 103, temperature 98.2 F (36.8 C), temperature source Oral, resp. rate 33, height 5\' 6"  (1.676 m), weight 127 kg (279 lb 15.8 oz), SpO2 93.00%.    General appearance: Severely obese white male who is hoarse, alert, cooperative, appears stated age and no distress Head: Normocephalic, without obvious abnormality, atraumatic Neck: no adenopathy, no carotid bruit, no JVD, supple, symmetrical, trachea midline and thyroid not enlarged, symmetric, no tenderness/mass/nodules Lungs: wheezes bilaterally Chest wall: no tenderness, Ecchymoses noted over previous AICD site Heart: irregularly irregular rhythm, S1, S2 normal and no S3 or S4 Abdomen: soft, non-tender; bowel sounds normal; no masses,  no  organomegaly Rectal: deferred Pulses: 2+ and symmetric Skin: Ecchymoses noted over pacer site. Neurologic: Grossly normal  Labs: Results for orders placed during the hospital encounter of 06/06/11 (from the past 24 hour(s))  Collection Time  06/10/11  4:41 AM      Component Value Range  Pro B Natriuretic peptide (BNP) 2026.0 (*) 0 - 125 (pg/mL)  PROTIME-INR     Status: Abnormal  Collection Time  06/10/11  4:42 AM      Component Value Range  Prothrombin Time 36.2 (*) 11.6 - 15.2 (seconds)  INR 3.57 (*) 0.00 - 1.49   RENAL FUNCTION PANEL     Status: Abnormal  Collection Time  06/10/11  4:42 AM      Component Value Range  Sodium 135  135 - 145 (mEq/L)  Potassium 4.1  3.5 - 5.1 (mEq/L)  Chloride 99  96 - 112 (mEq/L)  CO2 27  19 - 32 (mEq/L)  Glucose, Bld 115 (*) 70 - 99 (mg/dL)  BUN 62 (*) 6 - 23 (mg/dL)  Creatinine, Ser 1.61 (*) 0.50 - 1.35 (mg/dL)  Calcium 8.5  8.4 - 09.6 (mg/dL)  Phosphorus 4.8 (*) 2.3 - 4.6 (mg/dL)  Albumin 2.5 (*) 3.5 - 5.2 (g/dL)  GFR calc non Af Amer 12 (*) >90 (mL/min)  GFR calc Af Amer 13 (*) >90 (mL/min)  CBC     Status: Normal  Collection Time  06/10/11  4:42 AM      Component Value Range  WBC 10.3  4.0 - 10.5 (K/uL)  RBC 4.55  4.22 - 5.81 (MIL/uL)  Hemoglobin 13.1  13.0 - 17.0 (g/dL)  HCT 04.5  40.9 - 81.1 (%)  MCV 87.9  78.0 - 100.0 (fL)  MCH 28.8  26.0 - 34.0 (pg)  MCHC 32.8  30.0 - 36.0 (g/dL)  RDW 91.4  78.2 - 95.6 (%)  Platelets 223  150 - 400 (K/uL)  DIFFERENTIAL     Status: Abnormal  Collection Time  06/10/11  4:42 AM      Component Value Range  Neutrophils Relative 78 (*) 43 - 77 (%)  Neutro Abs 8.0 (*) 1.7 - 7.7 (K/uL)  Lymphocytes Relative 9 (*) 12 - 46 (%)  Lymphs Abs 0.9  0.7 - 4.0 (K/uL)  Monocytes Relative 13 (*) 3 - 12 (%)  Monocytes Absolute 1.3 (*) 0.1 - 1.0 (K/uL)  Eosinophils Relative 1  0 - 5 (%)  Eosinophils Absolute 0.1  0.0 - 0.7 (K/uL)  Basophils Relative 0  0 - 1 (%)  Basophils Absolute 0.0  0.0 - 0.1 (K/uL)    GLUCOSE, CAPILLARY     Status: Abnormal  Collection Time  06/10/11  4:47 AM      Component Value Range  Glucose-Capillary 110 (*) 70 - 99 (mg/dL)  Comment 1 Documented in Chart    Comment 2 Notify RN    GLUCOSE, CAPILLARY     Status: Normal  Collection Time  06/10/11  9:02 AM      Component Value Range  Glucose-Capillary 99  70 - 99 (mg/dL)  POCT I-STAT 3, BLOOD GAS (G3+)     Status: Abnormal  Collection Time  06/10/11  9:14 AM      Component Value Range  pH, Arterial 7.343 (*) 7.350 - 7.450   pCO2 arterial 48.6 (*) 35.0 - 45.0 (mmHg)  pO2, Arterial 73.0 (*) 80.0 - 100.0 (mmHg)  Bicarbonate 26.5 (*) 20.0 - 24.0 (mEq/L)  TCO2 28  0 - 100 (mmol/L)  O2 Saturation 94.0        Radiology: Basilar infiltrates consistent with pneumonia  EKG: Atrial fibrillation with rapid response, nonspecific changes  IMPRESSIONS:  1. Acute respiratory failure due to sepsis and pneumonia 2. History of dilated cardiomyopathy with ejection fraction relatively preserved on a technically difficult echo 3. Rapid Atrial fibrillation 4. Moderate coronary artery disease 5. Severe morbid obesity 6. Acute renal failure 7. History of ventricular tachycardia 8. Implantable defibrillator  RECOMMENDATION:  The patient has been critically ill but is improving. He is in rapid atrial fibrillation at this time. I would like to restart his beta blocker to get rate control when it is okay with pulmonary. He should continue on warfarin anticoagulation but it is currently being held because he was supratherapeutic. I would like to consider cardioversion in him once his  renal function is improving to help improve his hemodynamics.  Signed:  Darden Palmer MD [facility]   Cardiology Consultant 06/10/2011, 2:29 PM

## 2011-06-10 NOTE — Progress Notes (Signed)
ANTICOAGULATION  NOTE - Follow Up Consult  Pharmacy Consult for heparin/coumadin Indication: atrial fibrillation   No Known Allergies  Patient Measurements: Height: 5\' 6"  (167.6 cm) Weight: 279 lb 15.8 oz (127 kg) IBW/kg (Calculated) : 63.8    Vital Signs: Temp: 98.2 F (36.8 C) (01/02 0447) Temp src: Oral (01/02 0447) BP: 118/78 mmHg (01/02 0821) Pulse Rate: 91  (01/02 0821)  Labs:  Basename 06/10/11 0442 06/09/11 0500 06/08/11 1227 06/08/11 0430 06/07/11 0930  HGB 13.1 12.9* -- -- --  HCT 40.0 38.5* -- 39.8 --  PLT 223 187 -- 207 --  APTT -- 67* -- -- --  LABPROT 36.2* 37.2* -- 31.7* --  INR 3.57* 3.69* -- 3.01* --  HEPARINUNFRC -- -- -- -- --  CREATININE 4.75* 4.32* 4.05* -- --  CKTOTAL -- -- -- -- 23  CKMB -- -- -- -- 2.8  TROPONINI -- -- -- -- <0.30   Estimated Creatinine Clearance: 19 ml/min (by C-G formula based on Cr of 4.75).  Assessment:  INR trending up to 3.69. No bleeding reported.  No coumadin since admission. On 5mg  daily at home.  He has orders for both heparin and coumadin protocols.  INR supratherapeutic no not needed currently.  Unclear if invasive procedures planned.  Pharmacy Problem List/Assessment:  Anticoagulation:  INR remains supratherapeutic at 3.57. No bleeding reported.  No coumadin since admission. On 5mg  daily at home.  He has orders for both heparin and coumadin protocols.  INR supratherapeutic no not needed currently.  Unclear if invasive procedures planned. Infectious Disease: CAP: D#4, avelox, ceftaz rx, Cultures 12/29 - trach-NPF, blood-ngtd, ucx-neg, flu-negative ; WBC 10.3 (decr) Afebrile,  Narrowed to Avelox alone, total of 8 days per MD Cardiovascular: Afib, CAD, VT (with ICD), CHF -septic shock, weaned off levo, BP low at 118/78,  follow up resume of home medications when clinically indicated, Endocrinology: Gout, resume allopurinol when clinically indicated.   Gastrointestinal / Nutrition: TF (Nepro)-holding for possible  extubation Neurology: Depression, not on therapy PTA, Sedated on vent with PRN fent/versed   Nephrology: 1 kidney, SCr continues to increase, CrCl 19 ml/min, difficult foley placement, done by urology, volume overloaded, UOP improved, 1.2cc/kg/hr), decreasing diuretic Pulmonary: COPD, OSA, Asthma, on vent PRVC 20/510/5 50% FIO2   PTA Medication Issues: Not yet resumed: amiodarone 200 daily, aspirin 81, benazepril 40 daily, cored 25 bid, lasix 40 daily, IMDUR 30 daily, levothyroxine 100 daily, Allopurinol 300 mg daily, med history from cousins who seemed to know what he was doing, said he was taking everything (? Compliance)   Best Practices: IV protonix--f/u IV to po once extubated, heparin/warfarin currently INR supertherapeutic, follow up initiation of ICU mouth care protocol    Goal of Therapy:  INR 2-3 Heparin 0.3-0.7 when INR<2   Plan:  No heparin or coumadin today. If reversal needed would rec vtiamin k 2.5mg  PO or 1mg  IV.  Fayne Norrie, PharmD, BCPS 06/10/2011,9:03 AM

## 2011-06-10 NOTE — Procedures (Signed)
Extubation Procedure Note  Patient Details:   Name: Catarino Vold DOB: 24-May-1946 MRN: [id number]   Airway Documentation:  Airway 8 mm (Active)  Secured at (cm) 24 cm 06/10/2011  8:21 AM  Measured From Lips 06/10/2011  8:21 AM  Secured Location Center 06/10/2011  8:21 AM  Secured By [employer] 06/10/2011  8:21 AM  Tube Holder Repositioned Yes 06/10/2011  8:21 AM  Cuff Pressure (cm H2O) 26 cm H2O 06/09/2011  4:10 PM  Site Condition Dry 06/10/2011  8:21 AM    Evaluation  O2 sats: stable throughout Complications: No apparent complications Patient did tolerate procedure well. Bilateral Breath Sounds: Diminished Suctioning: Airway Yes pt able to speak  BBS diminished with no stridor noted at this time.    Levada Schilling 06/10/2011, 10:18 AM

## 2011-06-10 NOTE — Progress Notes (Signed)
Patient ID: Cody Faulkner, male   DOB: [birth year], 66 y.o.   MRN: [id number] S:intubated but awake, no complaints O:BP 118/78  Pulse 91  Temp(Src) 98.2 F (36.8 C) (Oral)  Resp 19  Ht 5\' 6"  (1.676 m)  Wt 127 kg (279 lb 15.8 oz)  BMI 45.19 kg/m2  SpO2 95%  Intake/Output Summary (Last 24 hours) at 06/10/11 0850 Last data filed at 06/10/11 0800  Gross per 24 hour  Intake 1499.84 ml  Output   3703 ml  Net -2203.16 ml   Weight change:  Gen:WD, WN, obese WM intubated but awake and comfortable  CVS:RRR  Resp:Scattered rhonchi  Abd:+BS, soft, NT/ND  YNW:GNFAO edema right hand, multiple ecchymoses   Lab 06/10/11 0442 06/09/11 0500 06/08/11 1227 06/08/11 0430 06/07/11 0500 06/06/11 1600 06/06/11 1120  NA 135 134* 132* 129* 127* 125* 129*  K 4.1 4.0 4.6 4.5 4.3 4.7 4.4  CL 99 101 99 96 97 95* 95*  CO2 27 24 23 25 23 24  --  GLUCOSE 115* 118* 99 106* 87 159* 150*  BUN 62* 48* 39* 37* 25* 21 25*  CREATININE 4.75* 4.32* 4.05* 3.95* 2.04* 1.04 1.10  ALB -- -- -- -- -- -- --  CALCIUM 8.5 8.3* 7.9* 8.4 8.3* 8.7 --  PHOS 4.8* -- -- -- 2.6 -- --  AST -- 9 -- -- -- 14 --  ALT -- 10 -- -- -- 17 --   Liver Function Tests:  Lab 06/10/11 0442 06/09/11 0500 06/06/11 1600  AST -- 9 14  ALT -- 10 17  ALKPHOS -- 50 56  BILITOT -- 0.6 1.1  PROT -- 5.4* 6.0  ALBUMIN 2.5* 2.4* 3.1*   No results found for this basename: LIPASE:3,AMYLASE:3 in the last 168 hours No results found for this basename: AMMONIA:3 in the last 168 hours CBC:  Lab 06/10/11 0442 06/09/11 0500 06/08/11 0430 06/07/11 0500 06/06/11 2141 06/06/11 1600  WBC 10.3 9.8 11.7* -- -- --  NEUTROABS 8.0* 7.9* -- -- -- 10.7*  HGB 13.1 12.9* 13.2 -- -- --  HCT 40.0 38.5* 39.8 -- -- --  MCV 87.9 86.1 86.1 85.2 84.2 --  PLT 223 187 207 -- -- --   Cardiac Enzymes:  Lab 06/07/11 0930 06/07/11 0430 06/06/11 1600 06/06/11 1044  CKTOTAL 23 29 32 39  CKMB 2.8 3.5 3.6 3.7  CKMBINDEX -- -- -- --  TROPONINI <0.30 <0.30 <0.30 <0.30    CBG:  Lab 06/10/11 0447 06/10/11 0014 06/09/11 1932 06/09/11 1534 06/09/11 1138  GLUCAP 110* 117* 105* 106* 111*    Iron Studies: No results found for this basename: IRON,TIBC,TRANSFERRIN,FERRITIN in the last 72 hours Studies/Results: Dg Chest Port 1 View  06/10/2011  *RADIOLOGY REPORT*  Clinical Data: Pulmonary infiltrates.  Numbness breath.  PORTABLE CHEST - 1 VIEW  Comparison: 06/09/2011  Findings: Endotracheal tube tip is 14 mm above the carina. Central catheter tip is in the superior vena cava.  AICD in place.  Atelectasis and effusions  have slightly improved.  Cardiomegaly persists.  Slight vascular congestion.  IMPRESSION: Slight improvement in the bilateral effusions and atelectasis.  Original Report Authenticated By: Gwynn Burly, M.D.   Dg Chest Port 1 View  06/09/2011  *RADIOLOGY REPORT*  Clinical Data: Ventilator dependent respiratory failure.  Follow up CHF and effusions.  PORTABLE CHEST - 1 VIEW 06/08/2001 0732 hours:  Comparison: Portable chest x-rays yesterday and dating back to 06/06/2011.  Findings: Endotracheal tube tip in satisfactory position approximately 3-4 cm above the carina.  Right costophrenic angle excluded from the image.  Cardiac silhouette markedly enlarged but stable.  Interval slight improvement in the pulmonary venous hypertension and pulmonary edema.  Stable bilateral pleural effusions and associated consolidation in the lower lobes.  Linear atelectasis in the left mid lung.  No new pulmonary parenchymal abnormalities.  Nasogastric courses below the diaphragm into the stomach.  Left subclavian pacing defibrillator unchanged.  Right jugular central venous catheter tip in the SVC.  IMPRESSION: Support apparatus satisfactory.  Slight improvement in the pulmonary venous hypertension and interstitial pulmonary edema. Stable bilateral pleural effusions and associated dense passive atelectasis or pneumonia in the lower lobes.  Linear atelectasis in the left mid lung.   Original Report Authenticated By: Arnell Sieving, M.D.      . albuterol-ipratropium  6 puff Inhalation Q4H  . antiseptic oral rinse  15 mL Mouth Rinse QID  . cefTAZidime (FORTAZ)  IV  1 g Intravenous Q24H  . chlorhexidine  15 mL Mouth/Throat BID  . feeding supplement (NEPRO CARB STEADY)  1,000 mL Per Tube Q24H  . feeding supplement  30 mL Per Tube 5 X Daily  . furosemide      . furosemide  80 mg Intravenous Q12H  . moxifloxacin  400 mg Intravenous Q24H  . pantoprazole (PROTONIX) IV  40 mg Intravenous Q24H  . DISCONTD: vancomycin  1,750 mg Intravenous Q48H    BMET    Component Value Date/Time   NA 135 06/10/2011 0442   K 4.1 06/10/2011 0442   CL 99 06/10/2011 0442   CO2 27 06/10/2011 0442   GLUCOSE 115* 06/10/2011 0442   BUN 62* 06/10/2011 0442   CREATININE 4.75* 06/10/2011 0442   CALCIUM 8.5 06/10/2011 0442   GFRNONAA 12* 06/10/2011 0442   GFRAA 13* 06/10/2011 0442   CBC    Component Value Date/Time   WBC 10.3 06/10/2011 0442   RBC 4.55 06/10/2011 0442   HGB 13.1 06/10/2011 0442   HCT 40.0 06/10/2011 0442   PLT 223 06/10/2011 0442   MCV 87.9 06/10/2011 0442   MCH 28.8 06/10/2011 0442   MCHC 32.8 06/10/2011 0442   RDW 14.1 06/10/2011 0442   LYMPHSABS 0.9 06/10/2011 0442   MONOABS 1.3* 06/10/2011 0442   EOSABS 0.1 06/10/2011 0442   BASOSABS 0.0 06/10/2011 0442     Assessment/Plan:  1. AKI- multifactorial: pt with solitary kidney, now with sepsis, hypotension, pressors, in setting of ACE-I as well as possible BOO s/p foley placed by urology. UOP continues to pick up. No indication for HD at this time but will continue to follow closely and continue to hold ACE or any RAS-blocking agent.  Will decrease lasix and follow 2. PNA/Sepsis- per sepsis protocol- improving 3. VDRF- per PCCM 4. Dilated CMP- cards to eval 5. Afib- on coumadin and rate controlled 6. Ecchymosis- noted by EP a week prior to admission, normal platelets, likely due to asa and coumadin 7. WCT s/p AICD 8. Solitary kidney- according to  records dating from 2007 in echart, had nx due to nonfunctioning kidney and accelerated HTN.      Aveen Stansel,Ann A

## 2011-06-10 NOTE — Progress Notes (Signed)
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 He was acute hypercarbic and hypoxic respiratory failure on arrival.  Lines / Drains: OETT 12/29>>> Right IJ CVL 12/29>>> R radial a-line 12/29>>>12/30  Cultures: Sputum 12/29>>>nf BC X2 12/29>>>> procalcitonin 12/29>>> Lactate 12/29>>> Influenza pcr 12/29>>>negative Urine strep 12/30>>> Urine legionella 12/30>>>  Antibiotics: Avelox (CAP)12/29>>> Rocephin (CAP)12/29>>>12/31 ceftaz 12/31>>>1/2 Vanc (CAP) 12/29>>>1/1 tamiflu 12/29>>>off  Tests / Events: Echo 12/29>>>poor acoustic windows. Very hard to see anything. Definity contrast was used. I think that the LV systolic function is normal. The RV appears mildly dilated with probably normal systolic function. Dilated IVC suggests elevated RV filling Pressure. 1/1- weaned reasonable 1/2 neg balance 2 liters, rise crt   Vital Signs: Temp:  [97.4 F (36.3 C)-99.4 F (37.4 C)] 98.2 F (36.8 C) (01/02 0447) Pulse Rate:  [68-120] 91  (01/02 0821) Resp:  [11-28] 19  (01/02 0821) BP: (77-148)/(27-98) 118/78 mmHg (01/02 0821) SpO2:  [90 %-97 %] 95 % (01/02 0821) FiO2 (%):  [39.6 %-40.4 %] 40 % (01/02 0821) Weight:  [127 kg (279 lb 15.8 oz)] 279 lb 15.8 oz (127 kg) (01/02 0400) CVP: 12    . sodium chloride 10 mL/hr at 06/06/11 1400  . fentaNYL infusion INTRAVENOUS Stopped (06/10/11 0800)  . norepinephrine (LEVOPHED) Adult infusion Stopped (06/10/11 0800)   I/O last 3 completed shifts: In: 2486.7 [P.O.:75; I.V.:1010.7; NG/GT:825; IV Piggyback:576] Out: 4000 [Urine:4000]  Physical Examination: General:  rass -1, follows commands Neuro:  opens eyes, perrl  HEENT:  jvd reduced, obese Cardiovascular:  Rrr, dist s1s2/ afib  Lungs:  Rhonchi diffuse  upper Abdomen:  Soft, NT Positive BS Musculoskeletal:  genedema, scattered areas of echymosis  Ventilator settings: Vent Mode:  [-] CPAP FiO2 (%):  [39.6 %-40.4 %] 40 % Set Rate:  [20 bmp] 20 bmp Vt Set:  [510 mL] 510 mL PEEP:  [5 cmH20] 5 cmH20 Pressure Support:  [5 cmH20-8 cmH20] 5 cmH20 Plateau Pressure:  [23 cmH20-26 cmH20] 23 cmH20  Labs and Imaging:   Lab 06/10/11 0442 06/09/11 0500 06/08/11 1227  NA 135 134* 132*  K 4.1 4.0 4.6  CL 99 101 99  CO2 27 24 23   BUN 62* 48* 39*  CREATININE 4.75* 4.32* 4.05*  GLUCOSE 115* 118* 99    Lab 06/10/11 0442 06/09/11 0500 06/08/11 0430  HGB 13.1 12.9* 13.2  HCT 40.0 38.5* 39.8  WBC 10.3 9.8 11.7*  PLT 223 187 207   Lab Results  Component Value Date   INR 3.57* 06/10/2011   INR 3.69* 06/09/2011   INR 3.01* 06/08/2011   ABG    Component Value Date/Time   PHART 7.392 06/08/2011 0515   PCO2ART 40.4 06/08/2011 0515   PO2ART 92.5 06/08/2011 0515   HCO3 24.0 06/08/2011 0515   TCO2 25.3 06/08/2011 0515   ACIDBASEDEF 0.3 06/08/2011 0515   O2SAT 97.7 06/08/2011 0515  PCXR:  Increased right > left airspace disease.    Assessment and Plan:   Acute respiratory failure with combined hypoxic and  Hypercarbic respiratory failure in  Setting of CAP (community acquired pneumonia), now progressive bilateral pulmonary infiltrates  Plan: - weaning cpap 5 ps 5, assess rsbi, abg Had neg 2 lit balance pcxr in am  Lasix to dc , see renal Goal extubation  Sepsis 2/2  CAP (NOS)  Lab 06/09/11 0500 06/08/11 0430  PROCALCITON 2.68 3.07   Plan: -pNA source Remains culture neg Dc ceftaz Maintain avelox, add stop date, total 8 days abx  Acute renal failure. H/o solitary kidney: in setting of shock Recent Labs  Basename 06/10/11 0442 06/09/11 0500 06/08/11 1227   CREATININE 4.75* 4.32* 4.05*  plan: - rise crt, bun with 2 lit neg Dc lasix kvo Chem in am cvp assessment  Dilated cardiomyopathy, Chronic systolic heart failure, and   Atrial fibrillation   Lab 06/07/11 0930 06/07/11 0430 06/06/11 1600 06/06/11 1044  TROPONINI <0.30 <0.30 <0.30 <0.30    Plan - Rate control - Coumadin per pharmacy when less 2 Echo reviewed- poor images ef likely wnl Will call dr Donnie Aho in am to update now  Warfarin-induced coagulopathy Lab Results  Component Value Date   INR 3.57* 06/10/2011   INR 3.69* 06/09/2011   INR 3.01* 06/08/2011  Plan - Trend, hold coumadin today again coags in am   Encephalopathy acute: due to hypercarbia +/1 sepsis Plan: - resolved wua excellent ambulate  Hypothyroid Plan: - Replacing  Hold TF for weaning  Best practices / Disposition: -->ICU status under PCCM -->full code -->coumadin  for DVT Px and AFIB, held coags up -->Protonix for GI Px -->ventilator bundle -->diet: TF start 12/30  The patient is critically ill with multiple organ systems failure and requires high complexity decision making for assessment and support, frequent evaluation and titration of therapies, application of advanced monitoring technologies and extensive interpretation of multiple databases. Critical Care Time devoted to patient care services described in this note is 30 minutes.  Nelda Bucks 06/10/2011, 8:58 AM  Mcarthur Rossetti. Tyson Alias, MD, FACP Pgr: [phone] [facility]   Nelda Bucks., M.D. [phone]

## 2011-06-11 ENCOUNTER — Inpatient Hospital Stay (HOSPITAL_COMMUNITY): Payer: Medicare Other

## 2011-06-11 DIAGNOSIS — E872 Acidosis: Secondary | ICD-10-CM

## 2011-06-11 DIAGNOSIS — I509 Heart failure, unspecified: Secondary | ICD-10-CM

## 2011-06-11 DIAGNOSIS — J9600 Acute respiratory failure, unspecified whether with hypoxia or hypercapnia: Secondary | ICD-10-CM

## 2011-06-11 DIAGNOSIS — R4182 Altered mental status, unspecified: Secondary | ICD-10-CM

## 2011-06-11 LAB — CBC
Hemoglobin: 13 g/dL (ref 13.0–17.0)
MCHC: 32.3 g/dL (ref 30.0–36.0)
Platelets: 177 10*3/uL (ref 150–400)
RBC: 4.54 MIL/uL (ref 4.22–5.81)

## 2011-06-11 LAB — RENAL FUNCTION PANEL
BUN: 69 mg/dL — ABNORMAL HIGH (ref 6–23)
CO2: 28 mEq/L (ref 19–32)
Chloride: 103 mEq/L (ref 96–112)
Creatinine, Ser: 4.9 mg/dL — ABNORMAL HIGH (ref 0.50–1.35)
Glucose, Bld: 80 mg/dL (ref 70–99)
Potassium: 4.4 mEq/L (ref 3.5–5.1)

## 2011-06-11 LAB — GLUCOSE, CAPILLARY
Glucose-Capillary: 65 mg/dL — ABNORMAL LOW (ref 70–99)
Glucose-Capillary: 77 mg/dL (ref 70–99)
Glucose-Capillary: 79 mg/dL (ref 70–99)
Glucose-Capillary: 91 mg/dL (ref 70–99)

## 2011-06-11 LAB — DIFFERENTIAL
Basophils Relative: 0 % (ref 0–1)
Eosinophils Absolute: 0.1 10*3/uL (ref 0.0–0.7)
Lymphs Abs: 0.9 10*3/uL (ref 0.7–4.0)
Monocytes Relative: 14 % — ABNORMAL HIGH (ref 3–12)
Neutro Abs: 6.1 10*3/uL (ref 1.7–7.7)
Neutrophils Relative %: 74 % (ref 43–77)

## 2011-06-11 LAB — PROTIME-INR
INR: 2.44 — ABNORMAL HIGH (ref 0.00–1.49)
Prothrombin Time: 26.9 seconds — ABNORMAL HIGH (ref 11.6–15.2)

## 2011-06-11 LAB — PRO B NATRIURETIC PEPTIDE: Pro B Natriuretic peptide (BNP): 2552 pg/mL — ABNORMAL HIGH (ref 0–125)

## 2011-06-11 MED ORDER — CARVEDILOL 3.125 MG PO TABS
3.1250 mg | ORAL_TABLET | Freq: Two times a day (BID) | ORAL | Status: DC
  Administered 2011-06-11 – 2011-06-12 (×3): 3.125 mg via ORAL
  Filled 2011-06-11 (×5): qty 1

## 2011-06-11 MED ORDER — WARFARIN SODIUM 4 MG PO TABS
4.0000 mg | ORAL_TABLET | Freq: Once | ORAL | Status: AC
  Administered 2011-06-11: 4 mg via ORAL
  Filled 2011-06-11: qty 1

## 2011-06-11 MED ORDER — DEXTROSE 50 % IV SOLN
25.0000 mL | Freq: Once | INTRAVENOUS | Status: AC | PRN
  Administered 2011-06-11: 25 mL via INTRAVENOUS
  Filled 2011-06-11: qty 50

## 2011-06-11 NOTE — Progress Notes (Addendum)
Patient ID: Cody Faulkner, male   DOB: 03-27-1946, 66 y.o.   MRN: [id number] S:exttubated awake, no complaints O:BP 129/81  Pulse 110  Temp(Src) 98 F (36.7 C) (Oral)  Resp 26  Ht 5\' 6"  (1.676 m)  Wt 126.3 kg (278 lb 7.1 oz)  BMI 44.94 kg/m2  SpO2 93%  Intake/Output Summary (Last 24 hours) at 06/11/11 0749 Last data filed at 06/11/11 0600  Gross per 24 hour  Intake 638.33 ml  Output   1493 ml  Net -854.67 ml   Weight change: -0.7 kg (-1 lb 8.7 oz) Gen:WD, WN, obese WM extubatedand comfortable  CVS:RRR  Resp:Scattered rhonchi  Abd:+BS, soft, NT/ND  BJY:NWGNF edema right hand, multiple ecchymoses   Lab 06/11/11 0430 06/10/11 0442 06/09/11 0500 06/08/11 1227 06/08/11 0430 06/07/11 0500 06/06/11 1600  NA 138 135 134* 132* 129* 127* 125*  K 4.4 4.1 4.0 4.6 4.5 4.3 4.7  CL 103 99 101 99 96 97 95*  CO2 28 27 24 23 25 23 24   GLUCOSE 80 115* 118* 99 106* 87 159*  BUN 69* 62* 48* 39* 37* 25* 21  CREATININE 4.90* 4.75* 4.32* 4.05* 3.95* 2.04* 1.04  ALB -- -- -- -- -- -- --  CALCIUM 8.7 8.5 8.3* 7.9* 8.4 8.3* 8.7  PHOS 4.6 4.8* -- -- -- 2.6 --  AST -- -- 9 -- -- -- 14  ALT -- -- 10 -- -- -- 17   Liver Function Tests:  Lab 06/11/11 0430 06/10/11 0442 06/09/11 0500 06/06/11 1600  AST -- -- 9 14  ALT -- -- 10 17  ALKPHOS -- -- 50 56  BILITOT -- -- 0.6 1.1  PROT -- -- 5.4* 6.0  ALBUMIN 2.6* 2.5* 2.4* --   No results found for this basename: LIPASE:3,AMYLASE:3 in the last 168 hours No results found for this basename: AMMONIA:3 in the last 168 hours CBC:  Lab 06/11/11 0430 06/10/11 0442 06/09/11 0500 06/08/11 0430 06/07/11 0500  WBC 8.2 10.3 9.8 -- --  NEUTROABS 6.1 8.0* 7.9* -- --  HGB 13.0 13.1 12.9* -- --  HCT 40.2 40.0 38.5* -- --  MCV 88.5 87.9 86.1 86.1 85.2  PLT 177 223 187 -- --   Cardiac Enzymes:  Lab 06/07/11 0930 06/07/11 0430 06/06/11 1600 06/06/11 1044  CKTOTAL 23 29 32 39  CKMB 2.8 3.5 3.6 3.7  CKMBINDEX -- -- -- --  TROPONINI <0.30 <0.30 <0.30 <0.30    CBG:  Lab 06/11/11 0420 06/11/11 0039 06/10/11 2015 06/10/11 1605 06/10/11 1305  GLUCAP 79 91 86 86 80    Iron Studies: No results found for this basename: IRON,TIBC,TRANSFERRIN,FERRITIN in the last 72 hours Studies/Results: Dg Chest Port 1 View  06/10/2011  *RADIOLOGY REPORT*  Clinical Data: Pulmonary infiltrates.  Numbness breath.  PORTABLE CHEST - 1 VIEW  Comparison: 06/09/2011  Findings: Endotracheal tube tip is 14 mm above the carina. Central catheter tip is in the superior vena cava.  AICD in place.  Atelectasis and effusions  have slightly improved.  Cardiomegaly persists.  Slight vascular congestion.  IMPRESSION: Slight improvement in the bilateral effusions and atelectasis.  Original Report Authenticated By: Gwynn Burly, M.D.   Dg Chest Port 1 View  06/09/2011  *RADIOLOGY REPORT*  Clinical Data: Ventilator dependent respiratory failure.  Follow up CHF and effusions.  PORTABLE CHEST - 1 VIEW 06/08/2001 0732 hours:  Comparison: Portable chest x-rays yesterday and dating back to 06/06/2011.  Findings: Endotracheal tube tip in satisfactory position approximately 3-4 cm above the  carina.  Right costophrenic angle excluded from the image.  Cardiac silhouette markedly enlarged but stable.  Interval slight improvement in the pulmonary venous hypertension and pulmonary edema.  Stable bilateral pleural effusions and associated consolidation in the lower lobes.  Linear atelectasis in the left mid lung.  No new pulmonary parenchymal abnormalities.  Nasogastric courses below the diaphragm into the stomach.  Left subclavian pacing defibrillator unchanged.  Right jugular central venous catheter tip in the SVC.  IMPRESSION: Support apparatus satisfactory.  Slight improvement in the pulmonary venous hypertension and interstitial pulmonary edema. Stable bilateral pleural effusions and associated dense passive atelectasis or pneumonia in the lower lobes.  Linear atelectasis in the left mid lung.  Original  Report Authenticated By: Arnell Sieving, M.D.      . ipratropium  0.5 mg Nebulization Q6H   And  . albuterol  2.5 mg Nebulization Q6H  . antiseptic oral rinse  15 mL Mouth Rinse QID  . chlorhexidine  15 mL Mouth/Throat BID  . moxifloxacin  400 mg Intravenous Q24H  . pantoprazole (PROTONIX) IV  40 mg Intravenous Q24H  . DISCONTD: albuterol-ipratropium  6 puff Inhalation Q4H  . DISCONTD: cefTAZidime (FORTAZ)  IV  1 g Intravenous Q24H  . DISCONTD: feeding supplement (NEPRO CARB STEADY)  1,000 mL Per Tube Q24H  . DISCONTD: feeding supplement  30 mL Per Tube 5 X Daily  . DISCONTD: furosemide  80 mg Intravenous Q12H  . DISCONTD: furosemide  80 mg Intravenous Daily    BMET    Component Value Date/Time   NA 138 06/11/2011 0430   K 4.4 06/11/2011 0430   CL 103 06/11/2011 0430   CO2 28 06/11/2011 0430   GLUCOSE 80 06/11/2011 0430   BUN 69* 06/11/2011 0430   CREATININE 4.90* 06/11/2011 0430   CALCIUM 8.7 06/11/2011 0430   GFRNONAA 11* 06/11/2011 0430   GFRAA 13* 06/11/2011 0430   CBC    Component Value Date/Time   WBC 8.2 06/11/2011 0430   RBC 4.54 06/11/2011 0430   HGB 13.0 06/11/2011 0430   HCT 40.2 06/11/2011 0430   PLT 177 06/11/2011 0430   MCV 88.5 06/11/2011 0430   MCH 28.6 06/11/2011 0430   MCHC 32.3 06/11/2011 0430   RDW 13.9 06/11/2011 0430   LYMPHSABS 0.9 06/11/2011 0430   MONOABS 1.2* 06/11/2011 0430   EOSABS 0.1 06/11/2011 0430   BASOSABS 0.0 06/11/2011 0430     Assessment/Plan:  1. AKI- multifactorial: pt with solitary kidney, now with sepsis, hypotension, pressors, in setting of ACE-I as well as possible BOO s/p foley placed by urology. UOP continues to pick up. No indication for HD at this time but will continue to follow closely and continue to hold ACE or any RAS-blocking agent. Will continue to hold lasix and follow 2. PNA/Sepsis- per sepsis protocol- improving 3. VDRF- per PCCM. Extubated and doing well 4. Dilated CMP- cards to eval 5. Afib- on coumadin and rate controlled 6. Ecchymosis-  noted by EP a week prior to admission, normal platelets, likely due to asa and coumadin 7. WCT s/p AICD 8. Solitary kidney- according to records dating from 2007 in echart, had nx due to nonfunctioning kidney and accelerated HTN.  9. supratherapeutic INR- per PCCM  Joahan Swatzell,Ron A

## 2011-06-11 NOTE — Progress Notes (Signed)
Subjective:  Very hoarse and c/w mild dyspnea. No chest pain.  Feels congested  Objective:  Vital Signs in the last 24 hours: BP 129/81  Pulse 110  Temp(Src) 97.3 F (36.3 C) (Oral)  Resp 26  Ht 5\' 6"  (1.676 m)  Wt 126.3 kg (278 lb 7.1 oz)  BMI 44.94 kg/m2  SpO2 97%  Physical Exam: Obese WM hoarse, mildly dyspneic Lungs:  Coarse rhonchi Cardiac:  irregular rhythm, normal S1 and S2, no S3 Abdomen:  Soft, nontender, no masses Extremities:  1+ edema, venous insufficiency changes  Intake/Output from previous day: 01/02 0701 - 01/03 0700 In: 638.3 [I.V.:353.3; NG/GT:25; IV Piggyback:260] Out: 1493 [Urine:1493]  Lab Results: Basic Metabolic Panel:  Basename 06/11/11 0430 06/10/11 0442  NA 138 135  K 4.4 4.1  CL 103 99  CO2 28 27  GLUCOSE 80 115*  BUN 69* 62*  CREATININE 4.90* 4.75*    CBC:  Basename 06/11/11 0430 06/10/11 0442  WBC 8.2 10.3  NEUTROABS 6.1 8.0*  HGB 13.0 13.1  HCT 40.2 40.0  MCV 88.5 87.9  PLT 177 223    PROTIME: Lab Results  Component Value Date   INR 2.44* 06/11/2011   INR 3.57* 06/10/2011   INR 3.69* 06/09/2011    Telemetry: Reviewed   Assessment/Plan:  1. Acute respiratory failure due to sepsis and pneumonia improving but still needs help  2.. Rapid Atrial fibrillation  Discussed with Dr. Tyson Alias.  Would like to add back carvedilol to help with rate control.  Restart warfarin now that coagulopathy resolved  3. Acute renal failure  Good output but waiting resolution  Rec:  Hold off on cardioversion until is a little stronger. Restart warfarin trial of low dose carvedilol.    Darden Palmer.  MD [facility] 06/11/2011, 10:04 AM

## 2011-06-11 NOTE — Progress Notes (Signed)
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 He was acute hypercarbic and hypoxic respiratory failure on arrival.  Lines / Drains: OETT 12/29>>>1/2 Right IJ CVL 12/29>>> R radial a-line 12/29>>>12/30  Cultures: Sputum 12/29>>>nf BC X2 12/29>>>> procalcitonin 12/29>>> Lactate 12/29>>> Influenza pcr 12/29>>>negative Urine strep 12/30>>> Urine legionella 12/30>>>  Antibiotics: Avelox (CAP)12/29>>>plan 6th stop Rocephin (CAP)12/29>>>12/31 ceftaz 12/31>>>1/2 Vanc (CAP) 12/29>>>1/1 tamiflu 12/29>>>off  Tests / Events: Echo 12/29>>>poor acoustic windows. Very hard to see anything. Definity contrast was used. I think that the LV systolic function is normal. The RV appears mildly dilated with probably normal systolic function. Dilated IVC suggests elevated RV filling Pressure. 1/1- weaned reasonable 1/2 neg balance 2 liters, rise crt 1/2- extubated, doing well, some increase rate  Vital Signs: Temp:  [97.3 F (36.3 C)-98.3 F (36.8 C)] 97.3 F (36.3 C) (01/03 1214) Pulse Rate:  [89-120] 105  (01/03 1400) Resp:  [21-44] 21  (01/03 1400) BP: (93-134)/(51-96) 124/82 mmHg (01/03 1400) SpO2:  [92 %-97 %] 94 % (01/03 1451) Weight:  [126.3 kg (278 lb 7.1 oz)] 278 lb 7.1 oz (126.3 kg) (01/03 0500) CVP: 12    . sodium chloride 20 mL/hr at 06/11/11 1500  . fentaNYL infusion INTRAVENOUS Stopped (06/10/11 0800)  . norepinephrine (LEVOPHED) Adult infusion Stopped (06/10/11 0800)   I/O last 3 completed shifts: In: 1254.4 [P.O.:75; I.V.:611.4; NG/GT:300; IV Piggyback:268] Out: 2868 [Urine:2868]  Physical Examination: General:  rass -1, follows commands Neuro:  opens eyes, perrl  HEENT:  jvd reduced, obese Cardiovascular:  Rrr, dist s1s2/ afib  Lungs:   Rhonchi diffuse upper Abdomen:  Soft, NT Positive BS Musculoskeletal:  genedema, scattered areas of echymosis  Ventilator settings:    Labs and Imaging:   Lab 06/11/11 0430 06/10/11 0442 06/09/11 0500  NA 138 135 134*  K 4.4 4.1 4.0  CL 103 99 101  CO2 28 27 24   BUN 69* 62* 48*  CREATININE 4.90* 4.75* 4.32*  GLUCOSE 80 115* 118*    Lab 06/11/11 0430 06/10/11 0442 06/09/11 0500  HGB 13.0 13.1 12.9*  HCT 40.2 40.0 38.5*  WBC 8.2 10.3 9.8  PLT 177 223 187   Lab Results  Component Value Date   INR 2.44* 06/11/2011   INR 3.57* 06/10/2011   INR 3.69* 06/09/2011   ABG    Component Value Date/Time   PHART 7.343* 06/10/2011 0914   PCO2ART 48.6* 06/10/2011 0914   PO2ART 73.0* 06/10/2011 0914   HCO3 26.5* 06/10/2011 0914   TCO2 28 06/10/2011 0914   ACIDBASEDEF 0.3 06/08/2011 0515   O2SAT 94.0 06/10/2011 0914  PCXR:  Increased right > left airspace disease.    Assessment and Plan:   Acute respiratory failure with combined hypoxic and  Hypercarbic respiratory failure in  Setting of CAP (community acquired pneumonia), now progressive bilateral pulmonary infiltrates  Plan: - extubated No distress IS Was neg , allowed Holding lasix, followe crt  Sepsis 2/2 CAP (NOS)  Lab 06/09/11 0500 06/08/11 0430  PROCALCITON 2.68 3.07   Plan: -pNA source Remains culture neg Maintain avelox, add stop date, total 8 days abx, stop 6th  Acute renal failure. H/o solitary kidney: in setting of shock Recent Labs  Basename 06/11/11 0430 06/10/11 0442 06/09/11 0500   CREATININE 4.90* 4.75* 4.32*  plan: - rise  crt, bun with this neg balance, residual lasix crt in am  Appreciate renal recs  Dilated cardiomyopathy, Chronic systolic heart failure, and  Atrial fibrillation    Lab 06/07/11 0930 06/07/11 0430 06/06/11 1600 06/06/11 1044  TROPONINI <0.30 <0.30 <0.30 <0.30    Plan - Rate control worse Home coreg per cards coum restart with inr down  Warfarin-induced coagulopathy Lab Results    Component Value Date   INR 2.44* 06/11/2011   INR 3.57* 06/10/2011   INR 3.69* 06/09/2011  Plan couamdin  Encephalopathy acute: due to hypercarbia +/1 sepsis Plan: - resolved PT  Hypothyroid Plan: - Replacing  Dysphagia? slp Npo ppi  Best practices / Disposition: -->ICU status under PCCM -->full code -->coumadin  for DVT Px and AFIB, held coags up -->Protonix for GI Px -->ventilator bundle -->diet: TF start 12/30  To tele   Sheniqua Carolan J. 06/11/2011, 3:33 PM  Mcarthur Rossetti. Tyson Alias, MD, FACP Pgr: [phone] [facility]   Nelda Bucks., M.D. [phone]

## 2011-06-11 NOTE — Progress Notes (Signed)
ANTICOAGULATION  NOTE - Follow Up Consult  Pharmacy Consult for coumadin Indication: atrial fibrillation   No Known Allergies  Patient Measurements: Height: 5\' 6"  (167.6 cm) Weight: 278 lb 7.1 oz (126.3 kg) IBW/kg (Calculated) : 63.8    Vital Signs: Temp: 97.3 F (36.3 C) (01/03 1214) Temp src: Oral (01/03 1214) BP: 124/82 mmHg (01/03 1400) Pulse Rate: 105  (01/03 1400)  Labs:  Basename 06/11/11 0430 06/10/11 0442 06/09/11 0500  HGB 13.0 13.1 --  HCT 40.2 40.0 38.5*  PLT 177 223 187  APTT -- -- 67*  LABPROT 26.9* 36.2* 37.2*  INR 2.44* 3.57* 3.69*  HEPARINUNFRC -- -- --  CREATININE 4.90* 4.75* 4.32*  CKTOTAL -- -- --  CKMB -- -- --  TROPONINI -- -- --   Estimated Creatinine Clearance: 18.4 ml/min (by C-G formula based on Cr of 4.9).  Assessment:  INR within therapeutic range = 2.44. No bleeding reported.  No coumadin since admission. On 5mg  daily at home.  Orders to start coumadin per Rx.    Pharmacy Problem List/Assessment:  Anticoagulation:  INR =2.44. INR > 7 at admit. For afib. Hgb and plts stable Infectious Disease: CAP: D#5 avelox plan is 8 days tx,  Cultures 12/29 - trach-NPF, blood-ngtd, ucx-neg, flu-negative ; WBC 8.2 (decr) Afebrile,   Cardiovascular: VSS. Afib, CAD, VT (with ICD), CHF -septic shock, cards starting low dose coreg, plan for DCCV when more stable/stronger,  follow up resume of home medications when clinically indicated.  Endocrinology: BGs well controlled. Gout, resume allopurinol when clinically indicated.   Gastrointestinal / Nutrition: NPO. TF stopped s/p extubation Neurology: Depression, not on therapy PTA, Sedated on vent with PRN fent/versed   Nephrology: solitary kidney, SCr continues to increase, Renal c/s - not RRT indicated at this time.  CrCl 19 ml/min, difficult foley placement, done by urology, volume overloaded, UOP 0.5cc/kg/hr), UOP down w/ decreasing diuretic Pulmonary: COPD, OSA, Asthma, 94% 4L.  PTA Medication Issues: Not yet  resumed: amiodarone 200 daily, aspirin 81, benazepril 40 daily, lasix 40 daily, IMDUR 30 daily, levothyroxine 100 daily, Allopurinol 300 mg daily, med history from cousins who seemed to know what he was doing, said he was taking everything (? Compliance)   Best Practices: IV protonix--f/u IV to po once extubated, heparin/warfarin currently INR supertherapeutic, follow up initiation of ICU mouth care protocol    Goal of Therapy:  INR 2-3   Plan:  1. Coumadin 4mg  po x 1 tonight.  Will not give 5mg  d/t being home dose and experienced supratherapeutic INR, but at same time want to prevent from trending <2.  2. Daily INR  Gervis Gaba, Tad Moore, PharmD, BCPS 06/11/2011,3:16 PM

## 2011-06-12 ENCOUNTER — Inpatient Hospital Stay (HOSPITAL_COMMUNITY): Payer: Medicare Other

## 2011-06-12 LAB — CBC
HCT: 38.1 % — ABNORMAL LOW (ref 39.0–52.0)
MCV: 88.2 fL (ref 78.0–100.0)
Platelets: 174 10*3/uL (ref 150–400)
RBC: 4.32 MIL/uL (ref 4.22–5.81)
WBC: 8 10*3/uL (ref 4.0–10.5)

## 2011-06-12 LAB — DIFFERENTIAL
Eosinophils Relative: 1 % (ref 0–5)
Lymphocytes Relative: 8 % — ABNORMAL LOW (ref 12–46)
Lymphs Abs: 0.6 10*3/uL — ABNORMAL LOW (ref 0.7–4.0)
Monocytes Absolute: 1.5 10*3/uL — ABNORMAL HIGH (ref 0.1–1.0)
Neutro Abs: 5.8 10*3/uL (ref 1.7–7.7)

## 2011-06-12 LAB — RENAL FUNCTION PANEL
Albumin: 2.5 g/dL — ABNORMAL LOW (ref 3.5–5.2)
BUN: 72 mg/dL — ABNORMAL HIGH (ref 6–23)
Chloride: 104 mEq/L (ref 96–112)
GFR calc Af Amer: 14 mL/min — ABNORMAL LOW (ref >90)
Phosphorus: 3.2 mg/dL (ref 2.3–4.6)
Potassium: 4.4 mEq/L (ref 3.5–5.1)
Sodium: 138 mEq/L (ref 135–145)

## 2011-06-12 LAB — CULTURE, BLOOD (ROUTINE X 2)
Culture  Setup Time: 201212291746
Culture: NO GROWTH

## 2011-06-12 LAB — GLUCOSE, CAPILLARY: Glucose-Capillary: 88 mg/dL (ref 70–99)

## 2011-06-12 LAB — PROTIME-INR: Prothrombin Time: 37.7 seconds — ABNORMAL HIGH (ref 11.6–15.2)

## 2011-06-12 MED ORDER — BENAZEPRIL HCL 40 MG PO TABS
40.0000 mg | ORAL_TABLET | Freq: Every day | ORAL | Status: DC
  Filled 2011-06-12: qty 1

## 2011-06-12 MED ORDER — ALBUTEROL SULFATE HFA 108 (90 BASE) MCG/ACT IN AERS
2.0000 | INHALATION_SPRAY | RESPIRATORY_TRACT | Status: DC | PRN
  Filled 2011-06-12: qty 6.7

## 2011-06-12 MED ORDER — WARFARIN SODIUM 5 MG PO TABS: 5.0000 mg | ORAL_TABLET | Freq: Every day | ORAL | Status: DC

## 2011-06-12 MED ORDER — FUROSEMIDE 40 MG PO TABS
40.0000 mg | ORAL_TABLET | Freq: Every day | ORAL | Status: DC
  Filled 2011-06-12: qty 1

## 2011-06-12 MED ORDER — BUDESONIDE-FORMOTEROL FUMARATE 160-4.5 MCG/ACT IN AERO
2.0000 | INHALATION_SPRAY | Freq: Two times a day (BID) | RESPIRATORY_TRACT | Status: DC
  Filled 2011-06-12: qty 6

## 2011-06-12 MED ORDER — ACETAMINOPHEN 500 MG PO TABS: 500.0000 mg | ORAL_TABLET | Freq: Four times a day (QID) | ORAL | Status: DC | PRN

## 2011-06-12 MED ORDER — AMIODARONE HCL 200 MG PO TABS
200.0000 mg | ORAL_TABLET | Freq: Every day | ORAL | Status: DC
  Administered 2011-06-12 – 2011-06-19 (×8): 200 mg via ORAL
  Filled 2011-06-12 (×9): qty 1

## 2011-06-12 MED ORDER — STARCH (THICKENING) PO POWD
1.0000 g | ORAL | Status: DC | PRN
  Filled 2011-06-12 (×2): qty 1

## 2011-06-12 MED ORDER — ACETAMINOPHEN 325 MG PO TABS
650.0000 mg | ORAL_TABLET | ORAL | Status: DC | PRN
  Administered 2011-06-12: 650 mg via ORAL
  Filled 2011-06-12: qty 2

## 2011-06-12 MED ORDER — ISOSORBIDE MONONITRATE ER 30 MG PO TB24
30.0000 mg | ORAL_TABLET | Freq: Every day | ORAL | Status: DC
  Filled 2011-06-12: qty 1

## 2011-06-12 MED ORDER — LORATADINE 10 MG PO TABS
10.0000 mg | ORAL_TABLET | Freq: Every day | ORAL | Status: DC
  Filled 2011-06-12: qty 1

## 2011-06-12 MED ORDER — ALLOPURINOL 300 MG PO TABS
300.0000 mg | ORAL_TABLET | Freq: Every day | ORAL | Status: DC
  Filled 2011-06-12: qty 1

## 2011-06-12 MED ORDER — AMIODARONE HCL 200 MG PO TABS
200.0000 mg | ORAL_TABLET | Freq: Every day | ORAL | Status: DC
  Filled 2011-06-12: qty 1

## 2011-06-12 MED ORDER — CARVEDILOL 6.25 MG PO TABS
6.2500 mg | ORAL_TABLET | Freq: Two times a day (BID) | ORAL | Status: DC
  Administered 2011-06-12 – 2011-06-13 (×2): 6.25 mg via ORAL
  Filled 2011-06-12 (×5): qty 1

## 2011-06-12 MED ORDER — GUAIFENESIN ER 600 MG PO TB12
600.0000 mg | ORAL_TABLET | Freq: Two times a day (BID) | ORAL | Status: DC
  Filled 2011-06-12 (×2): qty 1

## 2011-06-12 MED ORDER — POTASSIUM CHLORIDE CRYS ER 20 MEQ PO TBCR
20.0000 meq | EXTENDED_RELEASE_TABLET | Freq: Every day | ORAL | Status: DC
Start: 2011-06-12 — End: 2011-06-12

## 2011-06-12 MED ORDER — CARVEDILOL 25 MG PO TABS: 25.0000 mg | ORAL_TABLET | Freq: Two times a day (BID) | ORAL | Status: DC

## 2011-06-12 MED ORDER — ASPIRIN 81 MG PO CHEW
81.0000 mg | CHEWABLE_TABLET | Freq: Every day | ORAL | Status: DC
  Administered 2011-06-12 – 2011-06-19 (×8): 81 mg via ORAL
  Filled 2011-06-12 (×8): qty 1

## 2011-06-12 MED ORDER — LEVOTHYROXINE SODIUM 100 MCG PO TABS
100.0000 ug | ORAL_TABLET | Freq: Every day | ORAL | Status: DC
  Filled 2011-06-12: qty 1

## 2011-06-12 NOTE — Progress Notes (Signed)
Subjective:  Very hoarse and c/w mild dyspnea. No chest pain.  Feels congested. Confused  Objective:  Vital Signs in the last 24 hours: BP 135/88  Pulse 109  Temp(Src) 97.8 F (36.6 C) (Axillary)  Resp 28  Ht 5\' 6"  (1.676 m)  Wt 125 kg (275 lb 9.2 oz)  BMI 44.48 kg/m2  SpO2 94%  Physical Exam: Obese WM hoarse, sitting up at bedside Lungs:  Coarse rhonchi Cardiac:  irregular rhythm, normal S1 and S2, no S3 Abdomen:  Soft, nontender, no masses Extremities:  1+ edema, venous insufficiency changes  Intake/Output from previous day: 01/03 0701 - 01/04 0700 In: 710 [I.V.:460; IV Piggyback:250] Out: 1595 [Urine:1595]  Lab Results: Basic Metabolic Panel:  Basename 06/12/11 0415 06/11/11 0430  NA 138 138  K 4.4 4.4  CL 104 103  CO2 28 28  GLUCOSE 125* 80  BUN 72* 69*  CREATININE 4.58* 4.90*    CBC:  Basename 06/12/11 0415 06/11/11 0430  WBC 8.0 8.2  NEUTROABS 5.8 6.1  HGB 12.4* 13.0  HCT 38.1* 40.2  MCV 88.2 88.5  PLT 174 177    PROTIME: Lab Results  Component Value Date   INR 3.76* 06/12/2011   INR 2.44* 06/11/2011   INR 3.57* 06/10/2011    Telemetry: Reviewed   Assessment/Plan:  1. Acute respiratory failure due to sepsis and pneumonia improving but still needs help  2.. Rapid Atrial fibrillation .  Still fast and will increase carvedilol  3. Acute renal failure  Starting to turn around Rec:  Hold off on cardioversion until is a little stronger. Increase carvedilol.  Should not get ACE until renal has gotten better.  Will D/c.   Darden Palmer.  MD [facility] 06/12/2011, 1:51 PM

## 2011-06-12 NOTE — Progress Notes (Signed)
Patient HR in 130 Uncontrol A fib, Vital in chart. Patient asymptomatic. Md Wert notified,  parameters given to call for HR greater that 140.

## 2011-06-12 NOTE — Progress Notes (Signed)
Physical Therapy Evaluation Patient Details Name: Cody Faulkner MRN: [id number] DOB: December 04, 1945 Today's Date: 06/12/2011  Problem List:  Patient Active Problem List  Diagnoses  . 4098 Lead  . Atrial Fibrillation  . AICD in situ  . Dilated cardiomyopathy  . Chronic systolic heart failure  . Ventricular tachycardia  . Acute respiratory failure  . CAP (community acquired pneumonia)  . Warfarin-induced coagulopathy  . Hypothyroidism  . Asthma  . Sepsis  . Acute renal failure  . Morbid obesity  . Long-term (current) use of anticoagulants  . CAD (coronary artery disease)  . Sleep apnea  . Hypertensive heart disease without CHF  . Solitary kidney    Past Medical History:  Past Medical History  Diagnosis Date  . Pneumonia, community acquired     bilateral bibasilar   . COPD exacerbation   . [place]-induced atrial fibrillation     Cardioversion 2007  . Hypertension   . OSA (obstructive sleep apnea)     declines mask  . Depression   . Gouty arthritis   . CAD (coronary artery disease)     predominantly single vessel  . Ventricular tachycardia     s/p defibrillator-Medtronic EnTrust [id number]  . Dilated cardiomyopathy     s/p defibrillator Medtronic EnTrust [phone]  . Asthma   . 4782 Lead   . Implantable Defibrillator     Medtronic Entrust  . Chronic systolic heart failure   . Hypothyroidism   . Acute renal failure 06/06/2011  . Solitary kidney 06/05/2006   Past Surgical History:  Past Surgical History  Procedure Date  . Cardiac defibrillator placement 2007    Medtronic EnTrust [phone]  . Nephrectomy 1976    Right  . Arthroscopic knee surgery   . Cardiac catheterization 2007    60% Stenosis mid-CFX  . Insert / replace / remove pacemaker     PT Assessment/Plan/Recommendation PT Assessment Clinical Impression Statement: Pt presents to [facility] with ARF, underwent intubation and has been bed bound since 06/06/11. Upon PT evaluation he demonstrates generalized weakness and  decreased mobiilty from prolonged bed rest and is also affected by a prior left knee deficit. Pt will benefit from PT in the acute setting to maximize mobilty and independent and faciliate a safe d/c to next venue and decrease time for rehab. Will also benefit from a CIR consultation for continue therapy prior to d/c home.  PT Recommendation/Assessment: Patient will need skilled PT in the acute care venue PT Problem List: Decreased strength;Decreased range of motion;Decreased activity tolerance;Decreased balance;Decreased mobility;Decreased cognition;Decreased safety awareness;Decreased knowledge of use of DME;Cardiopulmonary status limiting activity;Obesity;Pain Barriers to Discharge: Decreased caregiver support Barriers to Discharge Comments: pt lives alone; reports however that he could potentially stay with [name] when he d/c's home PT Therapy Diagnosis : Difficulty walking;Abnormality of gait;Generalized weakness;Acute pain PT Plan PT Frequency: Min 3X/week PT Treatment/Interventions: DME instruction;Gait training;Stair training;Functional mobility training;Therapeutic activities;Therapeutic exercise;Neuromuscular re-education;Balance training;Patient/family education PT Recommendation Recommendations for Other Services: OT consult and Rehab consult Follow Up Recommendations: Inpatient Rehab (if CIR not an option pt will need SNF) Equipment Recommended: Defer to next venue PT Goals  Acute Rehab PT Goals PT Goal Formulation: With patient Pt will Roll Supine to Right Side: with modified independence PT Goal: Rolling Supine to Right Side - Progress: Progressing toward goal Pt will Roll Supine to Left Side: with modified independence PT Goal: Rolling Supine to Left Side - Progress: Progressing toward goal Pt will go Supine/Side to Sit: with modified independence PT Goal: Supine/Side to  Sit - Progress: Progressing toward goal Pt will go Sit to Supine/Side: with modified  independence PT Goal: Sit to Supine/Side - Progress: Progressing toward goal Pt will go Sit to Stand: with modified independence PT Goal: Sit to Stand - Progress: Progressing toward goal Pt will go Stand to Sit: with modified independence PT Goal: Stand to Sit - Progress: Progressing toward goal Pt will Transfer Bed to Chair/Chair to Bed: with modified independence PT Transfer Goal: Bed to Chair/Chair to Bed - Progress: Progressing toward goal Pt will Stand: with modified independence PT Goal: Stand - Progress: Progressing toward goal Pt will Ambulate: 51 - 150 feet;with least restrictive assistive device;with modified independence PT Goal: Ambulate - Progress: Progressing toward goal Pt will Perform Home Exercise Program: Independently PT Goal: Perform Home Exercise Program - Progress: Progressing toward goal  PT Evaluation Precautions/Restrictions  Precautions Precautions: Fall Restrictions Weight Bearing Restrictions: No Prior Functioning  Home Living Lives With: Alone Receives Help From: Family Type of Home: Mobile home (pt plans to stay with [name] on d/c??) Home Adaptive Equipment: Walker - rolling Additional Comments: Difficult to understand pt because he is hoarse, I was able to get that he can stay with [name] that lives in a mobile home and that they don't work Prior Function Level of Independence: Independent with basic ADLs;Independent with homemaking with ambulation;Independent with gait Vocation: Full time employment Cognition Cognition Arousal/Alertness: Awake/alert Overall Cognitive Status: Difficult to assess Difficult to assess due to: hard of hearing/deaf (pt very hoarse and raspy from intubation) Orientation Level: Oriented to person;Oriented to place;Oriented to time Safety/Judgement: Decreased awareness of safety precautions;Decreased safety judgement for tasks assessed Decreased Safety/Judgement: Impulsive Safety/Judgement - Other  Comments: Pt very impulsively trying to jump up from the bed regardless of verbal cueing, hearing may be an issue for him however Sensation/Coordination Sensation Light Touch: Appears Intact Extremity Assessment RUE Assessment RUE Assessment:  (grossly 4/5; unable to flex shoulder greater than 100 degree) LUE Assessment LUE Assessment:  (grossly 4/5 strength; unable to flex shoulder > 100 degrees) RLE Assessment RLE Assessment:  (grossly 4/5 tested functionally) LLE AROM (degrees) LLE Overall AROM Comments: knee flexion/extension limited secondary to pain, laying in bed pt unable to flex greater than 20-30 degrees LLE Strength LLE Overall Strength Comments: knee flexion/extension grossly 3/5 tested functionally; ankle dorsiflexion/platarflexion grossly 4/5 Mobility (including Balance) Bed Mobility Bed Mobility: Yes Supine to Sit: 3: Mod assist;HOB elevated (Comment degrees) (40 degrees) Supine to Sit Details (indicate cue type and reason): pt initiates independently but needing cueing to sequence, mid transfer pt reaching for PT's arm and pulling a good modA to bring self erect EOB Sitting - Scoot to Edge of Bed: 4: Min assist Sitting - Scoot to [employer] of Bed Details (indicate cue type and reason): increased difficulty with follow through; use of pad to assist with scoot Transfers Transfers: Yes Sit to Stand: 1: +2 Total assist;From bed Sit to Stand Details (indicate cue type and reason): pt stood +2totalpt50% x3; because of left knee pain pt with increased lateral lean right to unweight limb, unable to complete full sit to stand initial first 2 attempts because pt wanting to "loosten" his left knee by swinging it back and forth while half way standing and right arm supported on bed left on RW; final sit->stand successful for faciliation for follow through and sequencing Stand to Sit: 1: +2 Total assist Stand to Sit Details: +2totalpt70% with cueing for safe hand placement and to control  descent Stand  Pivot Transfers: 1: +2 Total assist Stand Pivot Transfer Details (indicate cue type and reason): +2totalpt50% with faciliation at hips for full extension and upright posture; cues to sequencing pivot; pt with more difficulty bearing weight through LLE so decreased stance time but relying on RW to [employer] Ambulation/Gait Ambulation/Gait: No  Posture/Postural Control Posture/Postural Control: No significant limitations Balance Balance Assessed: Yes Static Standing Balance Static Standing - Balance Support: Bilateral upper extremity supported Static Standing - Level of Assistance: 1: +2 Total assist (50%) Exercise  Total Joint Exercises Ankle Circles/Pumps: AROM;Both;5 reps;Supine End of Session PT - End of Session Equipment Utilized During Treatment: Gait belt Activity Tolerance: Patient tolerated treatment well;Patient limited by fatigue;Patient limited by pain Patient left: in chair;with call bell in reach Nurse Communication: Mobility status for transfers General Behavior During Session: [facility] for tasks performed Cognition: Impaired Cognitive Impairment: impulsive  WHITLOW,Shamari Trostel HELEN 06/12/2011, 10:55 AM

## 2011-06-12 NOTE — Progress Notes (Signed)
Patient ID: Eural Holzschuh, male   DOB: 04-Jan-1946, 66 y.o.   MRN: [id number] S:hoarse, awake, no new complaints O:BP 151/93  Pulse 111  Temp(Src) 98.1 F (36.7 C) (Oral)  Resp 18  Ht 5\' 6"  (1.676 m)  Wt 125 kg (275 lb 9.2 oz)  BMI 44.48 kg/m2  SpO2 93%  Intake/Output Summary (Last 24 hours) at 06/12/11 0756 Last data filed at 06/12/11 0600  Gross per 24 hour  Intake    710 ml  Output   1595 ml  Net   -885 ml   Weight change: -1.3 kg (-2 lb 13.9 oz) Gen:WD obese WM in NAD WUJ:WJXBJ Resp:scattered rhonchi and exp wheezes YNW:GNFAO NT Ext:no edema   Lab 06/12/11 0415 06/11/11 0430 06/10/11 0442 06/09/11 0500 06/08/11 1227 06/08/11 0430 06/07/11 0500 06/06/11 1600  NA 138 138 135 134* 132* 129* 127* --  K 4.4 4.4 4.1 4.0 4.6 4.5 4.3 --  CL 104 103 99 101 99 96 97 --  CO2 28 28 27 24 23 25 23  --  GLUCOSE 125* 80 115* 118* 99 106* 87 --  BUN 72* 69* 62* 48* 39* 37* 25* --  CREATININE 4.58* 4.90* 4.75* 4.32* 4.05* 3.95* 2.04* --  ALB -- -- -- -- -- -- -- --  CALCIUM 8.5 8.7 8.5 8.3* 7.9* 8.4 8.3* --  PHOS 3.2 4.6 4.8* -- -- -- 2.6 --  AST -- -- -- 9 -- -- -- 14  ALT -- -- -- 10 -- -- -- 17   Liver Function Tests:  Lab 06/12/11 0415 06/11/11 0430 06/10/11 0442 06/09/11 0500 06/06/11 1600  AST -- -- -- 9 14  ALT -- -- -- 10 17  ALKPHOS -- -- -- 50 56  BILITOT -- -- -- 0.6 1.1  PROT -- -- -- 5.4* 6.0  ALBUMIN 2.5* 2.6* 2.5* -- --   No results found for this basename: LIPASE:3,AMYLASE:3 in the last 168 hours No results found for this basename: AMMONIA:3 in the last 168 hours CBC:  Lab 06/12/11 0415 06/11/11 0430 06/10/11 0442 06/09/11 0500 06/08/11 0430  WBC 8.0 8.2 10.3 -- --  NEUTROABS 5.8 6.1 8.0* -- --  HGB 12.4* 13.0 13.1 -- --  HCT 38.1* 40.2 40.0 -- --  MCV 88.2 88.5 87.9 86.1 86.1  PLT 174 177 223 -- --   Cardiac Enzymes:  Lab 06/07/11 0930 06/07/11 0430 06/06/11 1600 06/06/11 1044  CKTOTAL 23 29 32 39  CKMB 2.8 3.5 3.6 3.7  CKMBINDEX -- -- -- --    TROPONINI <0.30 <0.30 <0.30 <0.30   CBG:  Lab 06/12/11 0434 06/12/11 0019 06/11/11 1956 06/11/11 1639 06/11/11 1116  GLUCAP 88 90 95 83 65*    Iron Studies: No results found for this basename: IRON,TIBC,TRANSFERRIN,FERRITIN in the last 72 hours Studies/Results: Dg Chest Port 1 View  06/11/2011  *RADIOLOGY REPORT*  Clinical Data: Pulmonary infiltrates.  PORTABLE CHEST - 1 VIEW  Comparison: Chest x-ray 06/10/2011.  Findings: The endotracheal tube and NG tube have been removed.  The right IJ catheter is stable.  Persistent cardiac enlargement with asymmetric bilateral airspace process, likely infiltrates. Bilateral pleural effusions persist.  No pneumothorax.  IMPRESSION:  1.  Removal of ET and NG tubes. 2.  Persistent cardiac enlargement, vascular congestion and bilateral airspace process.  Original Report Authenticated By: P. Loralie Champagne, M.D.      . ipratropium  0.5 mg Nebulization Q6H   And  . albuterol  2.5 mg Nebulization Q6H  . antiseptic oral rinse  15 mL Mouth Rinse QID  . carvedilol  3.125 mg Oral BID WC  . chlorhexidine  15 mL Mouth/Throat BID  . moxifloxacin  400 mg Intravenous Q24H  . pantoprazole (PROTONIX) IV  40 mg Intravenous Q24H  . warfarin  4 mg Oral ONCE-1800    BMET    Component Value Date/Time   NA 138 06/12/2011 0415   K 4.4 06/12/2011 0415   CL 104 06/12/2011 0415   CO2 28 06/12/2011 0415   GLUCOSE 125* 06/12/2011 0415   BUN 72* 06/12/2011 0415   CREATININE 4.58* 06/12/2011 0415   CALCIUM 8.5 06/12/2011 0415   GFRNONAA 12* 06/12/2011 0415   GFRAA 14* 06/12/2011 0415   CBC    Component Value Date/Time   WBC 8.0 06/12/2011 0415   RBC 4.32 06/12/2011 0415   HGB 12.4* 06/12/2011 0415   HCT 38.1* 06/12/2011 0415   PLT 174 06/12/2011 0415   MCV 88.2 06/12/2011 0415   MCH 28.7 06/12/2011 0415   MCHC 32.5 06/12/2011 0415   RDW 13.6 06/12/2011 0415   LYMPHSABS 0.6* 06/12/2011 0415   MONOABS 1.5* 06/12/2011 0415   EOSABS 0.1 06/12/2011 0415   BASOSABS 0.0 06/12/2011 0415      Assessment/Plan:  1. AKI- multifactorial: pt with solitary kidney, now with sepsis, hypotension, pressors, in setting of ACE-I as well as possible BOO s/p foley placed by urology. UOP continues to pick up. No indication for HD at this time but will continue to follow closely and continue to hold ACE or any RAS-blocking agent. Nothing further to add as he is improving and good uop.  Will sign off.  Call with questions or concerns 2. PNA/Sepsis- per sepsis protocol- improving 3. VDRF- per PCCM. Extubated and doing well 4. Dilated CMP- cards to eval 5. Rapid Afib- on coumadin and Cardiology following 6. Ecchymosis- noted by EP a week prior to admission, normal platelets, likely due to asa and coumadin 7. WCT s/p AICD 8. Solitary kidney- according to records dating from 2007 in echart, had nx due to nonfunctioning kidney and accelerated HTN.  9. supratherapeutic INR- per PCCM 10. Disoposition- per PCCM, will sign off as we have nothing further to add.  Continue to monitor UOP and creat and call with concerns  Vernessa Likes,Yakov A

## 2011-06-12 NOTE — Progress Notes (Signed)
Received new patient from 2100,  Alert and oriented x 3 O2 and cardiac telemetry monitor on patient , Patient with A fib in rate of 110's- 120's. Patient sitting up in chair, Full code. No distress noted. Chair alarm applied and fall precautions initiated for safety measures RN transferring patient states he has impulsive behaviors.  Continous pulse ox attached, place incentive spirometry at bedside  Teaching and oriuentation

## 2011-06-12 NOTE — Progress Notes (Signed)
Name: Cody Faulkner MRN: [id number] DOB: 05/26/1946    LOS: 6  PCCM Progress NOTE  Subjective: overnight patient developed atrial fibrillation, asking multiple questions this morning concerning when family will visit, who will be his doctors on d/c, etc  Lines / Drains: OETT 12/29>>>1/2  Right IJ CVL 12/29>>>  R radial a-line 12/29>>>12/30   Cultures: Sputum 12/29>>>nf  BC X2 12/29>>>>  procalcitonin 12/29>>>  Lactate 12/29>>>  Influenza pcr 12/29>>>negative  Urine strep 12/30>>>  Urine legionella 12/30>>>   Antibiotics: Avelox (CAP)12/29>>>plan 6th stop  Rocephin (CAP)12/29>>>12/31  ceftaz 12/31>>>1/2  Vanc (CAP) 12/29>>>1/1  tamiflu 12/29>>>off   Tests / Events: Echo 12/29>>>poor acoustic windows. Very hard to see anything. Definity contrast was used. I think that the LV systolic function is normal. The RV appears mildly dilated with probably normal systolic function. Dilated IVC suggests elevated RV filling Pressure.  1/1- weaned reasonable  1/2 neg balance 2 liters, rise crt  1/2- extubated, doing well, some increase rate 1/3- to chair , no distress  Vital Signs: Temp:  [97.3 F (36.3 C)-98.4 F (36.9 C)] 98.1 F (36.7 C) (01/04 0436) Pulse Rate:  [91-135] 111  (01/04 0600) Resp:  [18-42] 18  (01/04 0600) BP: (83-151)/(52-96) 151/93 mmHg (01/04 0600) SpO2:  [92 %-97 %] 93 % (01/04 0600) Weight:  [275 lb 9.2 oz (125 kg)] 275 lb 9.2 oz (125 kg) (01/04 0500) I/O last 3 completed shifts: In: 1148.3 [I.V.:613.3; NG/GT:25; IV Piggyback:510] Out: 2353 [Urine:2353]  Physical Examination: General: follows commands, verbalizing well although raspy voice Neuro: alert and orientef HEENT:+ jvd, obese  Cardiovascular: Rrr, dist s1s2/ afib  Lungs: Rhonchi diffuse upper  Abdomen: Soft, NT Positive BS  Musculoskeletal: gen edema, scattered areas of ecchymosis     Labs and Imaging:    Lab 06/12/11 0415 06/11/11 0430 06/10/11 0442  HGB 12.4* 13.0 13.1  HCT  38.1* 40.2 40.0  WBC 8.0 8.2 10.3  PLT 174 177 223     Lab 06/12/11 0415 06/11/11 0430 06/10/11 0442 06/09/11 0500 06/08/11 1227 06/07/11 0500  NA 138 138 135 134* 132* --  K 4.4 4.4 -- -- -- --  CL 104 103 99 101 99 --  CO2 28 28 27 24 23  --  GLUCOSE 125* 80 115* 118* 99 --  BUN 72* 69* 62* 48* 39* --  CREATININE 4.58* 4.90* 4.75* 4.32* 4.05* --  CALCIUM 8.5 8.7 8.5 8.3* 7.9* --  MG -- -- -- -- -- --  PHOS 3.2 4.6 4.8* -- -- 2.6     Lab 06/10/11 0914 06/08/11 0515 06/06/11 1502 06/06/11 1120 06/06/11 1050  PHART 7.343* 7.392 7.319* -- 7.122*  PCO2ART 48.6* 40.4 50.3* -- 94.4*  PO2ART 73.0* 92.5 216.0* -- 101.0*  HCO3 26.5* 24.0 26.1* -- 30.8*  TCO2 28 25.3 28 30  34  O2SAT 94.0 97.7 100.0 -- 94.0     Assessment and Plan: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 1. Respiratory: extubated, no distress, holding Lasix -will continue to monitor Has residual lasix affect  2. ID: Pneumonia likely source, cultures remain negative -will continue Avelox for 8 day therapy course  3. Renal: h/o solitary kidney,  -Acute Kidney Insufficiency: creatinine improving, Renal following, signed off No lasix Chem in am  atn resolving slowly  Lab 06/12/11 0415 06/11/11 0430 06/10/11 0442 06/09/11 0500 06/08/11 1227  CREATININE 4.58* 4.90* 4.75* 4.32*  4.05*   4. Cardiovascular: in atrial fibrillation, pt with dilated cardiomyopathy and chronic systolic heart failure -continue home course of Coreg per Cardiology -continue Coumadin  Tele a must  5. Neuro: acute encephalopathy due to hypercarbia with sepsis resolved -will continue PT Dc line neck Speech eval, swallow?  6. Endocrine: hypothyroid -will continue    To traid , await tele still day 2 Call pccm if needed  Mcarthur Rossetti. Tyson Alias, MD,  FACP Pgr: [phone] [facility]  Kristie Cowman 06/12/2011, 6:59 AM

## 2011-06-12 NOTE — Progress Notes (Signed)
ANTICOAGULATION  NOTE - Follow Up Consult  Pharmacy Consult for coumadin Indication: atrial fibrillation   No Known Allergies  Patient Measurements: Height: 5\' 6"  (167.6 cm) Weight: 275 lb 9.2 oz (125 kg) IBW/kg (Calculated) : 63.8    Vital Signs: Temp: 98 F (36.7 C) (01/04 0844) Temp src: Oral (01/04 0844) BP: 128/86 mmHg (01/04 0700) Pulse Rate: 119  (01/04 0800)  Labs:  Basename 06/12/11 0415 06/11/11 0430 06/10/11 0442  HGB 12.4* 13.0 --  HCT 38.1* 40.2 40.0  PLT 174 177 223  APTT -- -- --  LABPROT 37.7* 26.9* 36.2*  INR 3.76* 2.44* 3.57*  HEPARINUNFRC -- -- --  CREATININE 4.58* 4.90* 4.75*  CKTOTAL -- -- --  CKMB -- -- --  TROPONINI -- -- --   Estimated Creatinine Clearance: 19.5 ml/min (by C-G formula based on Cr of 4.58).  Assessment: h/o afib.  INR 3.76 elevated again today.. No bleeding reported. (On 5mg  daily at home.). Hgb down to 12.4 today.   Goal of Therapy:  INR 2-3   Plan:  1. No Coumadin today. Pasty Spillers, PharmD, BCPS 06/12/2011,9:11 AM

## 2011-06-12 NOTE — Progress Notes (Signed)
Patient seen by Speech Therapy, will recommend thickened liquids and crush meds until pt can have swallow study on 1/5. Pt up in chair,  2L, alert but confused, foley draining clear yellow urine, IV SL to RH. BP 128/84 Hr 111 O2 94% 2l , no compaints of pain. Will transfer patient in Bed.

## 2011-06-12 NOTE — Progress Notes (Signed)
Speech Language Pathology Clinical/Bedside Swallow Evaluation Patient Details  Name: Cody Faulkner MRN: [id number] DOB: 1946/03/01 Today's Date: 06/12/2011  Past Medical History:  Past Medical History  Diagnosis Date  . Pneumonia, community acquired     bilateral bibasilar   . COPD exacerbation   . Campath-induced atrial fibrillation     Cardioversion 2007  . Hypertension   . OSA (obstructive sleep apnea)     declines mask  . Depression   . Gouty arthritis   . CAD (coronary artery disease)     predominantly single vessel  . Ventricular tachycardia     s/p defibrillator-Medtronic EnTrust [id number]  . Dilated cardiomyopathy     s/p defibrillator Medtronic EnTrust [phone]  . Asthma   . 5621 Lead   . Implantable Defibrillator     Medtronic Entrust  . Chronic systolic heart failure   . Hypothyroidism   . Acute renal failure 06/06/2011  . Solitary kidney 06/05/2006   Past Surgical History:  Past Surgical History  Procedure Date  . Cardiac defibrillator placement 2007    Medtronic EnTrust [phone]  . Nephrectomy 1976    Right  . Arthroscopic knee surgery   . Cardiac catheterization 2007    60% Stenosis mid-CFX  . 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 He was acute hypercarbic and hypoxic respiratory failure on arrival. VDRF 05/2911/-06/10/11, patient with a very hoarse vocal quality and confused.  Recent chest x-ray showed bilateral airspace disease.   Assessment/Recommendations/Treatment Plan  SLP Assessment Clinical Impression Statement: Demonstrates a likely pharyngeal phase dysphagia marked by immediate, overt coughing on thin liquids.  Patient's hoarse vocal quality is indicative of vocal cord dysfunction, likely due to post-extubation.  Aspiration risk is likely related to this factor and is expected to improve, however a safe PO diet does need to  be objectively determined.  Will proceed with a conservative diet until a MBSS is completed.  Risk for Aspiration: Moderate Other Related Risk Factors: Decreased respiratory status  Recommendations 1. MBS 2. Dysphagia 1 (Puree) & Nectar-thick liquids 3. Medication Administration: Crushed with puree Supervision: Full supervision/cueing for compensatory strategies;Patient able to self feed Compensations: Slow rate;Small sips/bites Postural Changes and/or Swallow Maneuvers: Seated upright 90 degrees;Upright 30-60 min after meal Oral Care Recommendations: Oral care BID  Treatment Plan Treatment Plan Recommendations: F/U MBS in ___ days (Comment);Other (Comment) (1 day)  Prognosis Prognosis for Safe Diet Advancement: Good  Individuals Consulted Consulted and Agree with Results and Recommendations: MD;RN;Patient unable/family or caregiver not available  Swallow Evaluation Prior Functional Status   General  Date of Onset: 06/06/11 Type of Study: Bedside swallow evaluation Diet Prior to this Study: NPO;IV Temperature Spikes Noted: No Respiratory Status: Other (comment) (2 liters) History of Intubation: Yes Length of Intubations (days): 1 days Date extubated: 06/10/11 Behavior/Cognition: Alert;Confused;Agitated;Uncooperative;Requires cueing Oral Cavity - Dentition: Edentulous Vision: Functional for self-feeding Patient Positioning: Upright in chair Baseline Vocal Quality: Hoarse Volitional Cough: Strong Volitional Swallow: Able to elicit Ice chips: Not tested  Oral Motor/Sensory Function  Overall Oral Motor/Sensory Function: Appears within functional limits for tasks assessed  Consistency Results  Ice Chips Ice chips: Not tested  Thin Liquid Thin Liquid: Impaired Presentation: Cup;Self Fed Pharyngeal  Phase Impairments: Cough - Immediate;Wet Vocal Quality  Nectar Thick Liquid Nectar Thick Liquid: Not tested Other Comments: Patient refused  Honey Thick Liquid Honey  Thick Liquid: Not  tested  Puree Puree: Not tested Other Comments: Patient refused  Solid Solid: Not tested Other Comments: Patient refused   Myra Rude, M.S.,CCC-SLP Pager [phone] 06/12/2011,2:32 PM

## 2011-06-13 ENCOUNTER — Inpatient Hospital Stay (HOSPITAL_COMMUNITY): Payer: Medicare Other

## 2011-06-13 LAB — RENAL FUNCTION PANEL
Albumin: 2.6 g/dL — ABNORMAL LOW (ref 3.5–5.2)
Calcium: 9 mg/dL (ref 8.4–10.5)
Chloride: 107 mEq/L (ref 96–112)
Creatinine, Ser: 4.56 mg/dL — ABNORMAL HIGH (ref 0.50–1.35)
GFR calc Af Amer: 14 mL/min — ABNORMAL LOW (ref >90)
GFR calc non Af Amer: 12 mL/min — ABNORMAL LOW (ref >90)
Sodium: 142 mEq/L (ref 135–145)

## 2011-06-13 LAB — PROTIME-INR
INR: 2.91 — ABNORMAL HIGH (ref 0.00–1.49)
Prothrombin Time: 30.9 seconds — ABNORMAL HIGH (ref 11.6–15.2)

## 2011-06-13 MED ORDER — WARFARIN SODIUM 2.5 MG PO TABS
2.5000 mg | ORAL_TABLET | Freq: Once | ORAL | Status: AC
  Administered 2011-06-13: 2.5 mg via ORAL
  Filled 2011-06-13: qty 1

## 2011-06-13 MED ORDER — CARVEDILOL 12.5 MG PO TABS
12.5000 mg | ORAL_TABLET | Freq: Two times a day (BID) | ORAL | Status: DC
  Administered 2011-06-13 – 2011-06-19 (×12): 12.5 mg via ORAL
  Filled 2011-06-13 (×15): qty 1

## 2011-06-13 NOTE — Progress Notes (Signed)
Subjective: Patient seen and examined this am . Still tachycardic on monitor with afib. Communicates well but has hoarse voice  Objective:  Vital signs in last 24 hours:  Filed Vitals:   06/13/11 0500 06/13/11 0810 06/13/11 1410 06/13/11 1500  BP:    111/77  Pulse:    105  Temp:    98.6 F (37 C)  TempSrc:    Oral  Resp:    20  Height:      Weight: 124.5 kg (274 lb 7.6 oz)     SpO2:  93% 95% 95%    Intake/Output from previous day:   Intake/Output Summary (Last 24 hours) at 06/13/11 1602 Last data filed at 06/13/11 1149  Gross per 24 hour  Intake    970 ml  Output    525 ml  Net    445 ml    Physical Exam:  General: elcerly obese male in AND HEENT: no pallor, no icterus, moist oral mucosa, no JVD, no lymphadenopathy Heart: S1 &S2 irregular, without murmurs, rubs, gallops. Lungs: congested with scattered ronchi Abdomen: Soft, nontender, nondistended, positive bowel sounds. Extremities: No clubbing cyanosis, 1+dema with positive pedal pulses. Neuro: Alert and awake, not well oriented.  nonfocal.   Lab Results:  Basic Metabolic Panel:    Component Value Date/Time   NA 142 06/13/2011 0540   K 4.7 06/13/2011 0540   CL 107 06/13/2011 0540   CO2 26 06/13/2011 0540   BUN 80* 06/13/2011 0540   CREATININE 4.56* 06/13/2011 0540   GLUCOSE 108* 06/13/2011 0540   CALCIUM 9.0 06/13/2011 0540   CBC:    Component Value Date/Time   WBC 8.0 06/12/2011 0415   HGB 12.4* 06/12/2011 0415   HCT 38.1* 06/12/2011 0415   PLT 174 06/12/2011 0415   MCV 88.2 06/12/2011 0415   NEUTROABS 5.8 06/12/2011 0415   LYMPHSABS 0.6* 06/12/2011 0415   MONOABS 1.5* 06/12/2011 0415   EOSABS 0.1 06/12/2011 0415   BASOSABS 0.0 06/12/2011 0415    Recent Results (from the past 240 hour(s))  CULTURE, BLOOD (ROUTINE X 2)     Status: Normal   Collection Time   06/06/11 10:45 AM      Component Value Range Status Comment   Specimen Description BLOOD LEFT ARM   Final    Special Requests BOTTLES DRAWN AEROBIC AND ANAEROBIC 10CC    Final    Setup Time [id number]   Final    Culture NO GROWTH 5 DAYS   Final    Report Status 06/12/2011 FINAL   Final   CULTURE, BLOOD (ROUTINE X 2)     Status: Normal   Collection Time   06/06/11 10:55 AM      Component Value Range Status Comment   Specimen Description BLOOD LEFT HAND   Final    Special Requests BOTTLES DRAWN AEROBIC AND ANAEROBIC 10CC   Final    Setup Time [id number]   Final    Culture NO GROWTH 5 DAYS   Final    Report Status 06/12/2011 FINAL   Final   MRSA PCR SCREENING     Status: Normal   Collection Time   06/06/11  2:22 PM      Component Value Range Status Comment   MRSA by PCR NEGATIVE  NEGATIVE  Final   URINE CULTURE     Status: Normal   Collection Time   06/06/11  3:41 PM      Component Value Range Status Comment   Specimen Description URINE, CATHETERIZED  Final    Special Requests NONE   Final    Setup Time [id number]   Final    Colony Count NO GROWTH   Final    Culture NO GROWTH   Final    Report Status 2020/02/2611 FINAL   Final   CULTURE, BLOOD (ROUTINE X 2)     Status: Normal   Collection Time   06/06/11  4:31 PM      Component Value Range Status Comment   Specimen Description BLOOD LEFT HAND   Final    Special Requests BOTTLES DRAWN AEROBIC ONLY 4CC   Final    Setup Time [id number]   Final    Culture NO GROWTH 5 DAYS   Final    Report Status 06/12/2011 FINAL   Final   CULTURE, RESPIRATORY     Status: Normal   Collection Time   06/06/11  5:05 PM      Component Value Range Status Comment   Specimen Description TRACHEAL ASPIRATE   Final    Special Requests NONE   Final    Gram Stain     Final    Value: MODERATE WBC PRESENT,BOTH PMN AND MONONUCLEAR     RARE SQUAMOUS EPITHELIAL CELLS PRESENT     FEW GRAM POSITIVE COCCI IN PAIRS     FEW GRAM NEGATIVE COCCI   Culture Non-Pathogenic Oropharyngeal-type Flora Isolated.   Final    Report Status 06/09/2011 FINAL   Final     Studies/Results: Dg Chest Port 1 View  06/12/2011  *RADIOLOGY  REPORT*  Clinical Data: Shortness of breath, atrial fibrillation  PORTABLE CHEST - 1 VIEW  Comparison: Portable chest x-ray of 06/11/2011  Findings: Aeration has improved.  There is still patchy airspace disease at the lung bases.  Small effusions may be present. Cardiomegaly is stable.  A pacemaker with AICD leads remains.  IMPRESSION:  1.  Improved aeration. 2.  Persistent opacity at the lung bases. 3.  Stable cardiomegaly.  Original Report Authenticated By: Juline Patch, M.D.   Dg Swallowing Func-no Report  06/13/2011  CLINICAL DATA: determine safest PO diet   FLUOROSCOPY FOR SWALLOWING FUNCTION STUDY:  Fluoroscopy was provided for swallowing function study, which was  administered by a speech pathologist.  Final results and recommendations  from this study are contained within the speech pathology report.      Medications: Scheduled Meds:   . ipratropium  0.5 mg Nebulization Q6H   And  . albuterol  2.5 mg Nebulization Q6H  . amiodarone  200 mg Oral Daily  . antiseptic oral rinse  15 mL Mouth Rinse QID  . aspirin  81 mg Oral Daily  . carvedilol  6.25 mg Oral BID WC  . chlorhexidine  15 mL Mouth/Throat BID  . moxifloxacin  400 mg Intravenous Q24H  . pantoprazole (PROTONIX) IV  40 mg Intravenous Q24H   Continuous Infusions:   . sodium chloride Stopped (06/12/11 0600)  . fentaNYL infusion INTRAVENOUS Stopped (06/10/11 0800)   PRN Meds:.food thickener, insulin aspart  Assessment/Plan: 66 year old male with hx of Afib on coumadin , COPD, OSA, CAD, DCM( s/p AICD), depression intubated and admitted to ICU with Acute respiratory failure with combined hypoxic and Hypercarbic respiratory failure in Setting of CAP (community acquired pneumonia), and progressive bilateral pulmonary infiltrates and AKI.   1.acute hypoxic/ hypercarbic Respiratory failure secondary to PNA :  Patient admitted to ICU , now extubated now transferred to tele  cont o2 via [place]  cont nebs and avelox (  completes 8 days  of treatment today) Swallow eval done today, recommends level 2 dys diet with nectar thick  2. AKI  secondary to ATN  From sepsis, on ARB and possible BOO UOP improving and renal fn very  slowly improving as well  monitor renal fn daily  no need for HD as per renal  3. Acute encephalopathy  secondary to hypercarbic resp failure, sepsis and uremia Slowly improving  cont PT  3. Afib with RVR  patient chronically on coumadin.Was held due to coagulapathy , now resumed  cardiology following Cont amiodarone Cont coreg bid. Still increased rate to 110-120 on tele. Will increase coreg dose to 12.5 mg bid    4. cardiomyopathy  appears euvolemic Cont ASA and BB Holding ACEi due to AKI      LOS: 7 days   Mrytle Bento 06/13/2011, 4:02 PM

## 2011-06-13 NOTE — Plan of Care (Signed)
Problem: Phase II Progression Outcomes Goal: Tolerating diet Outcome: Completed/Met Date Met:  06/13/11 dys II nectar thick liquids

## 2011-06-13 NOTE — Progress Notes (Signed)
RN called Dr. Gonzella Lex to see if pt can travel off the monitor. Pt running A fib 110's. Md orders for pt to travel on the monitor. Julien Nordmann [facility]

## 2011-06-13 NOTE — Progress Notes (Signed)
Patient ID: Cody Faulkner, male   DOB: 03-01-1946, 66 y.o.   MRN: [id number] SUBJECTIVE: Carvedilol dose was increased yesterday. Heart rate is under better control.   Filed Vitals:   06/13/11 0149 06/13/11 0454 06/13/11 0500 06/13/11 0810  BP:  136/85    Pulse:  107    Temp:  97.4 F (36.3 C)    TempSrc:  Oral    Resp:  24    Height:      Weight:   274 lb 7.6 oz (124.5 kg)   SpO2: 93% 94%  93%    Intake/Output Summary (Last 24 hours) at 06/13/11 0918 Last data filed at 06/13/11 0647  Gross per 24 hour  Intake    720 ml  Output    875 ml  Net   -155 ml    LABS: Basic Metabolic Panel:  Basename 06/13/11 0540 06/12/11 0415  NA 142 138  K 4.7 4.4  CL 107 104  CO2 26 28  GLUCOSE 108* 125*  BUN 80* 72*  CREATININE 4.56* 4.58*  CALCIUM 9.0 8.5  MG -- --  PHOS 3.2 3.2   Liver Function Tests:  Basename 06/13/11 0540 06/12/11 0415  AST -- --  ALT -- --  ALKPHOS -- --  BILITOT -- --  PROT -- --  ALBUMIN 2.6* 2.5*   No results found for this basename: LIPASE:2,AMYLASE:2 in the last 72 hours CBC:  Basename 06/12/11 0415 06/11/11 0430  WBC 8.0 8.2  NEUTROABS 5.8 6.1  HGB 12.4* 13.0  HCT 38.1* 40.2  MCV 88.2 88.5  PLT 174 177     PHYSICAL EXAM Patient is comfortable in bed.He is coughing intermittently with sputum production.   TELEMETRY: I have reviewed telemetry. There is atrial fib. The rate ranges up to 110.    ASSESSMENT AND PLAN:  Principal Problem:  *Acute respiratory failure Active Problems:   Atrial Fibrillation    The patient's beta blocker dose was increased yesterday. There is improving heart rate control. I've chosen not to change his meds further.   Dilated cardiomyopathy  Chronic systolic heart failure  CAP (community acquired pneumonia)  Warfarin-induced coagulopathy  Sepsis  Acute renal failure  Long-term (current) use of anticoagulants   Willa Rough 06/13/2011 9:18 AM

## 2011-06-13 NOTE — Procedures (Signed)
Modified Barium Swallow Procedure Note Patient Details  Name: Cody Faulkner MRN: [id number] Date of Birth: 1946-03-09  Today's Date: 06/13/2011 Time: 1030    Past Medical History:  Past Medical History  Diagnosis Date  . Pneumonia, community acquired     bilateral bibasilar   . COPD exacerbation   . Campath-induced atrial fibrillation     Cardioversion 2007  . Hypertension   . OSA (obstructive sleep apnea)     declines mask  . Depression   . Gouty arthritis   . CAD (coronary artery disease)     predominantly single vessel  . Ventricular tachycardia     s/p defibrillator-Medtronic EnTrust [id number]  . Dilated cardiomyopathy     s/p defibrillator Medtronic EnTrust [phone]  . Asthma   . 1191 Lead   . Implantable Defibrillator     Medtronic Entrust  . Chronic systolic heart failure   . Hypothyroidism   . Acute renal failure 06/06/2011  . Solitary kidney 06/05/2006   Past Surgical History:  Past Surgical History  Procedure Date  . Cardiac defibrillator placement 2007    Medtronic EnTrust [phone]  . Nephrectomy 1976    Right  . Arthroscopic knee surgery   . Cardiac catheterization 2007    60% Stenosis mid-CFX  . Insert / replace / remove pacemaker    HPI:   Pt referred for MBS following BSE.  Please refer for BSE report for additional details.  Recommendation/Prognosis  Clinical Impression Dysphagia Diagnosis: Moderate pharyngeal phase dysphagia;Mild pharyngeal phase dysphagia;Mild oral phase dysphagia Clinical impression: Mild oral dysphagia, primarily due to poor dentition.  Pharyngeal phase sensory and motor based dysphagia characterized by delayed swallow reflex due to decreased sensation at primary trigger site.  Trace psot-swallow residue noted due to weak tongue base retraction.  Penetration and aspiration of thin liquid was seen during  the swallow due to poor airway closure.  Immediate reflexive cough response elicited.  Penetration and aspiration of nectar thick liquid was  seen only on large and consecutive boluses.  Limiting bolus size and rate eliminated penetration and aspiration of this consistency. Pt cognitive impairment will increase aspiration risk, as independent adherence to safe swallow precautions is unlikely.  Pt appears safe for Dys 2 (chopped) diet with nectar thick liquids via small individual sips; rec meds whole with nectar thick liquids and 1:1 supervision for adherence to safe swalow precautions Recommendations Solid Consistency: Dysphagia 2 (Fine chop) Liquid Consistency: Nectar Liquid Administration via: Straw;Cup Medication Administration: Whole meds with liquid (nectar thick liquid) Supervision: Full supervision/cueing for compensatory strategies;Patient able to self feed Compensations: Slow rate;Small sips/bites Postural Changes and/or Swallow Maneuvers: Seated upright 90 degrees;Upright 30-60 min after meal Oral Care Recommendations: Oral care BID Other Recommendations: Order thickener from pharmacy;Remove water pitcher;Clarify dietary restrictions Follow up Recommendations: 24 hour supervision/assistance Prognosis Prognosis for Safe Diet Advancement: Fair Barriers to Reach Goals: Cognitive deficits Barriers/Prognosis Comment: Pt reports habitually taking large bites and sips at a rapid rate.  Significant change in this behavior will be difficult. Individuals Consulted Consulted and Agree with Results and Recommendations: Patient;RN  General:  Date of Onset: 06/06/11 HPI: MBS per BSE recommendation Type of Study: Initial MBS Diet Prior to this Study: Dysphagia 1 (puree);Nectar-thick liquids;IV Temperature Spikes Noted: No Respiratory Status: Supplemental O2 delivered via (comment) ([place]@4L ) History of Intubation: Yes Length of Intubations (days): 1 days Date extubated: 06/10/11 Behavior/Cognition: Alert;Cooperative;Pleasant mood Oral Cavity - Dentition: Missing dentition Vision: Functional for self-feeding Patient Positioning:  Upright in chair Baseline Vocal Quality: Hoarse  Volitional Cough: Strong Volitional Swallow: Able to elicit Anatomy: Within functional limits Pharyngeal Secretions: Not observed secondary MBS Ice chips: Not tested  Oral Phase Oral Preparation/Oral Phase Oral Phase: WFL Pharyngeal Phase  Pharyngeal Phase Pharyngeal Phase: Impaired Pharyngeal - Nectar Pharyngeal - Nectar Straw: Delayed swallow initiation;Premature spillage to valleculae;Pharyngeal residue - valleculae;Penetration/Aspiration during swallow;Reduced airway/laryngeal closure;Trace aspiration;Compensatory strategies attempted (Comment);Reduced tongue base retraction (small individual sips tolerated without pen/asp) Penetration/Aspiration details (nectar straw): Material enters airway, passes BELOW cords and not ejected out despite cough attempt by patient Pharyngeal - Thin Pharyngeal - Thin Straw: Delayed swallow initiation;Premature spillage to pyriform;Premature spillage to valleculae;Reduced airway/laryngeal closure;Penetration/Aspiration during swallow;Trace aspiration Penetration/Aspiration details (thin straw): Material enters airway, passes BELOW cords and not ejected out despite cough attempt by patient Pharyngeal - Solids Pharyngeal - Puree: Delayed swallow initiation;Premature spillage to valleculae;Pharyngeal residue - valleculae;Pharyngeal residue - pyriform Pharyngeal - Mechanical Soft: Delayed swallow initiation;Premature spillage to valleculae Pharyngeal - Pill: Delayed swallow initiation;Premature spillage to valleculae;Other (Comment) (Given with nectar thick liquid) Pharyngeal Phase - Comment Pharyngeal Comment: Delayed swallow across consistencies, slight post-swallow residue.  Penetration/aspiration avoided with small individual sips of nectar thick liquid Cervical Esophageal Phase  Cervical Esophageal Phase Cervical Esophageal Phase: [facility] Celia B. Bueche, MSP, CCC-SLP [phone]

## 2011-06-13 NOTE — Progress Notes (Signed)
ANTICOAGULATION CONSULT NOTE - Follow Up Consult  Pharmacy Consult for Coumadin Indication: atrial fibrillation  No Known Allergies  Patient Measurements: Height: 5\' 6"  (167.6 cm) Weight: 274 lb 7.6 oz (124.5 kg) IBW/kg (Calculated) : 63.8  Adjusted Body Weight:   Vital Signs: Temp: 98.6 F (37 C) (01/05 1500) Temp src: Oral (01/05 1500) BP: 111/77 mmHg (01/05 1500) Pulse Rate: 105  (01/05 1500)  Labs:  Basename 06/13/11 0540 06/12/11 0415 06/11/11 0430  HGB -- 12.4* 13.0  HCT -- 38.1* 40.2  PLT -- 174 177  APTT -- -- --  LABPROT 30.9* 37.7* 26.9*  INR 2.91* 3.76* 2.44*  HEPARINUNFRC -- -- --  CREATININE 4.56* 4.58* 4.90*  CKTOTAL -- -- --  CKMB -- -- --  TROPONINI -- -- --   Estimated Creatinine Clearance: 19.6 ml/min (by C-G formula based on Cr of 4.56).   Medications:  Scheduled:    . ipratropium  0.5 mg Nebulization Q6H   And  . albuterol  2.5 mg Nebulization Q6H  . amiodarone  200 mg Oral Daily  . antiseptic oral rinse  15 mL Mouth Rinse QID  . aspirin  81 mg Oral Daily  . carvedilol  12.5 mg Oral BID WC  . chlorhexidine  15 mL Mouth/Throat BID  . moxifloxacin  400 mg Intravenous Q24H  . pantoprazole (PROTONIX) IV  40 mg Intravenous Q24H  . DISCONTD: carvedilol  6.25 mg Oral BID WC    Assessment: 66 y/o male patient on chronic coumadin for h/o afib. INR therapeutic, trending down s/p dose held yesterday. No bleeding reported. Will try lower dose, INR fluctuation may be d/t diet.  Goal of Therapy:  INR 2-3   Plan:  Coumadin 2.5mg  today and f/u in am.  Verlene Mayer, PharmD, BCPS Pager [phone] 06/13/2011,5:08 PM

## 2011-06-14 LAB — RENAL FUNCTION PANEL
Albumin: 2.7 g/dL — ABNORMAL LOW (ref 3.5–5.2)
BUN: 90 mg/dL — ABNORMAL HIGH (ref 6–23)
Creatinine, Ser: 4.58 mg/dL — ABNORMAL HIGH (ref 0.50–1.35)
Phosphorus: 4.5 mg/dL (ref 2.3–4.6)
Potassium: 5.2 mEq/L — ABNORMAL HIGH (ref 3.5–5.1)

## 2011-06-14 MED ORDER — IPRATROPIUM BROMIDE 0.02 % IN SOLN
0.5000 mg | Freq: Three times a day (TID) | RESPIRATORY_TRACT | Status: DC
  Administered 2011-06-15 – 2011-06-19 (×11): 0.5 mg via RESPIRATORY_TRACT
  Filled 2011-06-14 (×14): qty 2.5

## 2011-06-14 MED ORDER — ALBUTEROL SULFATE (5 MG/ML) 0.5% IN NEBU
2.5000 mg | INHALATION_SOLUTION | Freq: Three times a day (TID) | RESPIRATORY_TRACT | Status: DC
  Administered 2011-06-15 – 2011-06-19 (×11): 2.5 mg via RESPIRATORY_TRACT
  Filled 2011-06-14 (×13): qty 0.5

## 2011-06-14 MED ORDER — ALBUTEROL SULFATE (5 MG/ML) 0.5% IN NEBU: 2.5000 mg | INHALATION_SOLUTION | RESPIRATORY_TRACT | Status: DC | PRN

## 2011-06-14 MED ORDER — WARFARIN SODIUM 2.5 MG PO TABS
2.5000 mg | ORAL_TABLET | Freq: Once | ORAL | Status: AC
  Administered 2011-06-14: 2.5 mg via ORAL
  Filled 2011-06-14: qty 1

## 2011-06-14 MED ORDER — SODIUM POLYSTYRENE SULFONATE 15 GM/60ML PO SUSP
15.0000 g | Freq: Once | ORAL | Status: AC
  Administered 2011-06-14: 15 g via ORAL
  Filled 2011-06-14: qty 60

## 2011-06-14 MED ORDER — PANTOPRAZOLE SODIUM 40 MG PO TBEC
40.0000 mg | DELAYED_RELEASE_TABLET | Freq: Every day | ORAL | Status: DC
  Administered 2011-06-14 – 2011-06-19 (×6): 40 mg via ORAL
  Filled 2011-06-14 (×6): qty 1

## 2011-06-14 MED ORDER — PNEUMOCOCCAL VAC POLYVALENT 25 MCG/0.5ML IJ INJ
0.5000 mL | INJECTION | INTRAMUSCULAR | Status: AC
  Administered 2011-06-15: 0.5 mL via INTRAMUSCULAR
  Filled 2011-06-14: qty 0.5

## 2011-06-14 NOTE — Progress Notes (Signed)
Subjective: Patient seen and examined this am. Feels better. HR better on tele after coreg dose further increased yesterday  Objective:  Vital signs in last 24 hours:  Filed Vitals:   06/14/11 0219 06/14/11 0500 06/14/11 0937 06/14/11 1500  BP:  156/108  102/67  Pulse:  96  101  Temp:  97.7 F (36.5 C)  97.8 F (36.6 C)  TempSrc:  Oral  Oral  Resp:  21  20  Height:      Weight:  124.7 kg (274 lb 14.6 oz)    SpO2: 93% 93% 98% 95%    Intake/Output from previous day:   Intake/Output Summary (Last 24 hours) at 06/14/11 1657 Last data filed at 06/13/11 1856  Gross per 24 hour  Intake 218.33 ml  Output      0 ml  Net 218.33 ml    Physical Exam:   General: elderly obese male in AND  HEENT: no pallor, no icterus, moist oral mucosa, no JVD, no lymphadenopathy  Heart: S1 &S2 irregular, without murmurs, rubs, gallops.  Lungs: congested with scattered ronchi  Abdomen: Soft, nontender, nondistended, positive bowel sounds.  Extremities: No clubbing cyanosis, 1+pitting edema with positive pedal pulses.  Neuro: Alert awake and oriented nonfocal.   Lab Results:  Basic Metabolic Panel:    Component Value Date/Time   NA 145 06/14/2011 0705   K 5.2* 06/14/2011 0705   CL 109 06/14/2011 0705   CO2 28 06/14/2011 0705   BUN 90* 06/14/2011 0705   CREATININE 4.58* 06/14/2011 0705   GLUCOSE 95 06/14/2011 0705   CALCIUM 9.0 06/14/2011 0705   CBC:    Component Value Date/Time   WBC 8.0 06/12/2011 0415   HGB 12.4* 06/12/2011 0415   HCT 38.1* 06/12/2011 0415   PLT 174 06/12/2011 0415   MCV 88.2 06/12/2011 0415   NEUTROABS 5.8 06/12/2011 0415   LYMPHSABS 0.6* 06/12/2011 0415   MONOABS 1.5* 06/12/2011 0415   EOSABS 0.1 06/12/2011 0415   BASOSABS 0.0 06/12/2011 0415    Recent Results (from the past 240 hour(s))  CULTURE, BLOOD (ROUTINE X 2)     Status: Normal   Collection Time   06/06/11 10:45 AM      Component Value Range Status Comment   Specimen Description BLOOD LEFT ARM   Final    Special Requests  BOTTLES DRAWN AEROBIC AND ANAEROBIC 10CC   Final    Setup Time [id number]   Final    Culture NO GROWTH 5 DAYS   Final    Report Status 06/12/2011 FINAL   Final   CULTURE, BLOOD (ROUTINE X 2)     Status: Normal   Collection Time   06/06/11 10:55 AM      Component Value Range Status Comment   Specimen Description BLOOD LEFT HAND   Final    Special Requests BOTTLES DRAWN AEROBIC AND ANAEROBIC 10CC   Final    Setup Time [id number]   Final    Culture NO GROWTH 5 DAYS   Final    Report Status 06/12/2011 FINAL   Final   MRSA PCR SCREENING     Status: Normal   Collection Time   06/06/11  2:22 PM      Component Value Range Status Comment   MRSA by PCR NEGATIVE  NEGATIVE  Final   URINE CULTURE     Status: Normal   Collection Time   06/06/11  3:41 PM      Component Value Range Status Comment   Specimen Description  URINE, CATHETERIZED   Final    Special Requests NONE   Final    Setup Time [id number]   Final    Colony Count NO GROWTH   Final    Culture NO GROWTH   Final    Report Status 09-11-202012 FINAL   Final   CULTURE, BLOOD (ROUTINE X 2)     Status: Normal   Collection Time   06/06/11  4:31 PM      Component Value Range Status Comment   Specimen Description BLOOD LEFT HAND   Final    Special Requests BOTTLES DRAWN AEROBIC ONLY 4CC   Final    Setup Time [id number]   Final    Culture NO GROWTH 5 DAYS   Final    Report Status 06/12/2011 FINAL   Final   CULTURE, RESPIRATORY     Status: Normal   Collection Time   06/06/11  5:05 PM      Component Value Range Status Comment   Specimen Description TRACHEAL ASPIRATE   Final    Special Requests NONE   Final    Gram Stain     Final    Value: MODERATE WBC PRESENT,BOTH PMN AND MONONUCLEAR     RARE SQUAMOUS EPITHELIAL CELLS PRESENT     FEW GRAM POSITIVE COCCI IN PAIRS     FEW GRAM NEGATIVE COCCI   Culture Non-Pathogenic Oropharyngeal-type Flora Isolated.   Final    Report Status 06/09/2011 FINAL   Final     Studies/Results: Dg  Swallowing Func-no Report  06/13/2011  CLINICAL DATA: determine safest PO diet   FLUOROSCOPY FOR SWALLOWING FUNCTION STUDY:  Fluoroscopy was provided for swallowing function study, which was  administered by a speech pathologist.  Final results and recommendations  from this study are contained within the speech pathology report.      Medications: Scheduled Meds:   . ipratropium  0.5 mg Nebulization Q6H   And  . albuterol  2.5 mg Nebulization Q6H  . amiodarone  200 mg Oral Daily  . antiseptic oral rinse  15 mL Mouth Rinse QID  . aspirin  81 mg Oral Daily  . carvedilol  12.5 mg Oral BID WC  . chlorhexidine  15 mL Mouth/Throat BID  . pantoprazole  40 mg Oral Q1200  . warfarin  2.5 mg Oral ONCE-1800  . warfarin  2.5 mg Oral ONCE-1800  . DISCONTD: pantoprazole (PROTONIX) IV  40 mg Intravenous Q24H   Continuous Infusions:   . sodium chloride 20 mL/hr at 06/13/11 0701  . fentaNYL infusion INTRAVENOUS Stopped (06/10/11 0800)   PRN Meds:.food thickener, insulin aspart  Assessment:  66 year old male with hx of Afib on coumadin , COPD, OSA, CAD, DCM( s/p AICD), depression intubated and admitted to ICU with Acute respiratory failure with combined hypoxic and Hypercarbic respiratory failure in Setting of CAP (community acquired pneumonia), and progressive bilateral pulmonary infiltrates and AKI.    Plan 1.acute hypoxic/ hypercarbic Respiratory failure secondary to PNA :  Patient admitted to ICU , now extubated and  transferred to tele  cont o2 via [place]  cont nebs . completed 8 days of avelox on 1/5  Swallow eval done on 1/5 , recommends level 2 dys diet with nectar thick   2. AKI  secondary to ATN From sepsis, on ARB and possible BOO  UOP documented only about 500 cc. Will closely monitor monitor renal fn daily  no need for HD as per renal   3. Acute encephalopathy  secondary to  hypercarbic resp failure, sepsis and uremia  improving cont PT   3. Afib with RVR  patient chronically  on coumadin.Was held due to coagulapathy , now resumed  cardiology following  Cont amiodarone  Cont coreg bid.  coreg dose increased further to  to 12.5 mg bid , now rate better control 2 d echo pending  4. cardiomyopathy  appears euvolemic  Cont ASA and BB Holding ACEi due to AKI    Full code   LOS: 8 days   Cody Faulkner 06/14/2011, 4:57 PM

## 2011-06-14 NOTE — Plan of Care (Signed)
Problem: Phase I Progression Outcomes Goal: Hemodynamically stable Outcome: Completed/Met Date Met:  06/14/11 HR better controlled Afib 100's

## 2011-06-14 NOTE — Progress Notes (Signed)
Patient ID: Cody Faulkner, male   DOB: March 12, 1946, 66 y.o.   MRN: [id number] Nothing for me to add from the cardiology viewpoint this morning.  Jerral Bonito, MD

## 2011-06-14 NOTE — Progress Notes (Addendum)
ANTICOAGULATION CONSULT NOTE - Follow Up Consult  Pharmacy Consult for Coumadin Indication: atrial fibrillation  No Known Allergies  Patient Measurements: Height: 5\' 6"  (167.6 cm) Weight: 274 lb 14.6 oz (124.7 kg) IBW/kg (Calculated) : 63.8  Adjusted Body Weight:   Vital Signs: Temp: 97.7 F (36.5 C) (01/06 0500) Temp src: Oral (01/06 0500) BP: 156/108 mmHg (01/06 0500) Pulse Rate: 96  (01/06 0500)  Labs:  Basename 06/14/11 0705 06/13/11 0540 06/12/11 0415  HGB -- -- 12.4*  HCT -- -- 38.1*  PLT -- -- 174  APTT -- -- --  LABPROT 21.3* 30.9* 37.7*  INR 1.81* 2.91* 3.76*  HEPARINUNFRC -- -- --  CREATININE 4.58* 4.56* 4.58*  CKTOTAL -- -- --  CKMB -- -- --  TROPONINI -- -- --   Estimated Creatinine Clearance: 19.5 ml/min (by C-G formula based on Cr of 4.58).   Medications:  Scheduled:     . ipratropium  0.5 mg Nebulization Q6H   And  . albuterol  2.5 mg Nebulization Q6H  . amiodarone  200 mg Oral Daily  . antiseptic oral rinse  15 mL Mouth Rinse QID  . aspirin  81 mg Oral Daily  . carvedilol  12.5 mg Oral BID WC  . chlorhexidine  15 mL Mouth/Throat BID  . pantoprazole (PROTONIX) IV  40 mg Intravenous Q24H  . warfarin  2.5 mg Oral ONCE-1800  . DISCONTD: carvedilol  6.25 mg Oral BID WC    Assessment: 66 y/o male patient on chronic coumadin for h/o afib. INR subtherapeutic, INR trending down s/p dose held yesterday. No bleeding reported. Will continue with lower dose d/t jump with 4mg  dose 1/3, INR fluctuation may be d/t diet.  Goal of Therapy:  INR 2-3   Plan:  Coumadin 2.5mg  today and f/u in am. Change PPI to PO  Juliette Alcide, PharmD, BCPS.  Pager: [phone] 06/14/2011,3:52 PM

## 2011-06-15 LAB — RENAL FUNCTION PANEL
BUN: 90 mg/dL — ABNORMAL HIGH (ref 6–23)
CO2: 28 mEq/L (ref 19–32)
Chloride: 109 mEq/L (ref 96–112)
Creatinine, Ser: 4.23 mg/dL — ABNORMAL HIGH (ref 0.50–1.35)

## 2011-06-15 LAB — PROTIME-INR: INR: 1.64 — ABNORMAL HIGH (ref 0.00–1.49)

## 2011-06-15 MED ORDER — SODIUM POLYSTYRENE SULFONATE 15 GM/60ML PO SUSP
30.0000 g | Freq: Once | ORAL | Status: AC
  Administered 2011-06-15: 30 g via ORAL
  Filled 2011-06-15: qty 120

## 2011-06-15 MED ORDER — SODIUM POLYSTYRENE SULFONATE 15 GM/60ML PO SUSP
30.0000 g | Freq: Once | ORAL | Status: DC
  Filled 2011-06-15: qty 120

## 2011-06-15 MED ORDER — WARFARIN SODIUM 5 MG PO TABS
5.0000 mg | ORAL_TABLET | Freq: Once | ORAL | Status: AC
  Administered 2011-06-15: 5 mg via ORAL
  Filled 2011-06-15: qty 1

## 2011-06-15 MED ORDER — HEPARIN SOD (PORCINE) IN D5W 100 UNIT/ML IV SOLN
1950.0000 [IU]/h | INTRAVENOUS | Status: DC
  Administered 2011-06-15: 1350 [IU]/h via INTRAVENOUS
  Administered 2011-06-16: 1650 [IU]/h via INTRAVENOUS
  Filled 2011-06-15 (×4): qty 250

## 2011-06-15 NOTE — Progress Notes (Signed)
Cody Faulkner was found on his knees on the floor. Pt states "I tried to get up and go to the bathroom." Chair alarm in place did not alarm. Pts VS are T-98.4, P-112, 95% 02 sat, BP 151/91 Pt currently in bed, denies pain and is free from complaints. Pt requested that family not to be notified but after talking with RN pt not refusing for family to be notified. Dr. Gonzella Lex text paged to notify and Patients next of kin notified spoke to [facility].

## 2011-06-15 NOTE — Progress Notes (Addendum)
Subjective: Patient seen and examined this am. informs feeling better  Objective:  Vital signs in last 24 hours:  Filed Vitals:   06/15/11 0940 06/15/11 1145 06/15/11 1416 06/15/11 1500  BP:  107/54  112/81  Pulse:  88  107  Temp:  98.4 F (36.9 C)  98.3 F (36.8 C)  TempSrc:  Oral  Oral  Resp:  18  18  Height:      Weight:      SpO2: 95% 91% 96% 96%    Intake/Output from previous day:   Intake/Output Summary (Last 24 hours) at 06/15/11 1723 Last data filed at 06/15/11 0900  Gross per 24 hour  Intake    467 ml  Output   1275 ml  Net   -808 ml    Physical Exam:  General: elderly obese male in AND  HEENT: no pallor, no icterus, moist oral mucosa, no JVD, no lymphadenopathy  Heart: S1 &S2 irregular, without murmurs, rubs, gallops.  Lungs: congested with scattered ronchi  Abdomen: Soft, nontender, nondistended, positive bowel sounds.  Extremities: No clubbing cyanosis, 1+pitting edema with positive pedal pulses.  Neuro: Alert awake and oriented nonfocal.   Lab Results:  Basic Metabolic Panel:    Component Value Date/Time   NA 142 06/15/2011 0615   K 5.6* 06/15/2011 0615   CL 109 06/15/2011 0615   CO2 28 06/15/2011 0615   BUN 90* 06/15/2011 0615   CREATININE 4.23* 06/15/2011 0615   GLUCOSE 104* 06/15/2011 0615   CALCIUM 8.8 06/15/2011 0615   CBC:    Component Value Date/Time   WBC 8.0 06/12/2011 0415   HGB 12.4* 06/12/2011 0415   HCT 38.1* 06/12/2011 0415   PLT 174 06/12/2011 0415   MCV 88.2 06/12/2011 0415   NEUTROABS 5.8 06/12/2011 0415   LYMPHSABS 0.6* 06/12/2011 0415   MONOABS 1.5* 06/12/2011 0415   EOSABS 0.1 06/12/2011 0415   BASOSABS 0.0 06/12/2011 0415    Recent Results (from the past 240 hour(s))  CULTURE, BLOOD (ROUTINE X 2)     Status: Normal   Collection Time   06/06/11 10:45 AM      Component Value Range Status Comment   Specimen Description BLOOD LEFT ARM   Final    Special Requests BOTTLES DRAWN AEROBIC AND ANAEROBIC 10CC   Final    Setup Time [id number]   Final     Culture NO GROWTH 5 DAYS   Final    Report Status 06/12/2011 FINAL   Final   CULTURE, BLOOD (ROUTINE X 2)     Status: Normal   Collection Time   06/06/11 10:55 AM      Component Value Range Status Comment   Specimen Description BLOOD LEFT HAND   Final    Special Requests BOTTLES DRAWN AEROBIC AND ANAEROBIC 10CC   Final    Setup Time [id number]   Final    Culture NO GROWTH 5 DAYS   Final    Report Status 06/12/2011 FINAL   Final   MRSA PCR SCREENING     Status: Normal   Collection Time   06/06/11  2:22 PM      Component Value Range Status Comment   MRSA by PCR NEGATIVE  NEGATIVE  Final   URINE CULTURE     Status: Normal   Collection Time   06/06/11  3:41 PM      Component Value Range Status Comment   Specimen Description URINE, CATHETERIZED   Final    Special Requests NONE  Final    Setup Time [id number]   Final    Colony Count NO GROWTH   Final    Culture NO GROWTH   Final    Report Status 2020/06/2610 FINAL   Final   CULTURE, BLOOD (ROUTINE X 2)     Status: Normal   Collection Time   06/06/11  4:31 PM      Component Value Range Status Comment   Specimen Description BLOOD LEFT HAND   Final    Special Requests BOTTLES DRAWN AEROBIC ONLY 4CC   Final    Setup Time [id number]   Final    Culture NO GROWTH 5 DAYS   Final    Report Status 06/12/2011 FINAL   Final   CULTURE, RESPIRATORY     Status: Normal   Collection Time   06/06/11  5:05 PM      Component Value Range Status Comment   Specimen Description TRACHEAL ASPIRATE   Final    Special Requests NONE   Final    Gram Stain     Final    Value: MODERATE WBC PRESENT,BOTH PMN AND MONONUCLEAR     RARE SQUAMOUS EPITHELIAL CELLS PRESENT     FEW GRAM POSITIVE COCCI IN PAIRS     FEW GRAM NEGATIVE COCCI   Culture Non-Pathogenic Oropharyngeal-type Flora Isolated.   Final    Report Status 06/09/2011 FINAL   Final     Studies/Results: No results found.  Medications: Scheduled Meds:   . albuterol  2.5 mg Nebulization  TID  . amiodarone  200 mg Oral Daily  . antiseptic oral rinse  15 mL Mouth Rinse QID  . aspirin  81 mg Oral Daily  . carvedilol  12.5 mg Oral BID WC  . chlorhexidine  15 mL Mouth/Throat BID  . ipratropium  0.5 mg Nebulization TID  . pantoprazole  40 mg Oral Q1200  . pneumococcal 23 valent vaccine  0.5 mL Intramuscular Tomorrow-1000  . sodium polystyrene  15 g Oral Once  . sodium polystyrene  30 g Oral Once  . warfarin  2.5 mg Oral ONCE-1800  . warfarin  5 mg Oral ONCE-1800  . DISCONTD: albuterol  2.5 mg Nebulization Q6H  . DISCONTD: ipratropium  0.5 mg Nebulization Q6H   Continuous Infusions:   . sodium chloride 20 mL/hr at 06/13/11 0701  . fentaNYL infusion INTRAVENOUS Stopped (06/10/11 0800)  . heparin 1,350 Units/hr (06/15/11 1023)   PRN Meds:.albuterol, food thickener, insulin aspart   Assessment:  66 year old male with hx of Afib on coumadin , COPD, OSA, CAD, DCM( s/p AICD), depression intubated and admitted to ICU with Acute respiratory failure with combined hypoxic and Hypercarbic respiratory failure in Setting of CAP (community acquired pneumonia), and progressive bilateral pulmonary infiltrates and AKI.   Plan   1.acute hypoxic/ hypercarbic Respiratory failure secondary to PNA :  Patient admitted to ICU , now extubated and transferred to tele  cont o2 via [place]  cont nebs . completed 8 days of avelox on 1/5  Swallow eval done on 1/5 , recommends level 2 dys diet with nectar thick   2. AKI  secondary to ATN From sepsis, on ARB and possible BOO  UOP of close to 1300 cc Will closely monitor  monitor renal fn daily  no need for HD as per renal   3. Acute encephalopathy  secondary to hypercarbic resp failure, sepsis and uremia  improving  cont PT   3. Afib with RVR  patient chronically on coumadin.Was held  due to coagulapathy , now resumed  cardiology following  Cont amiodarone  Cont coreg bid. coreg dose increased further to to 12.5 mg bid , now rate better  control  INR subthetherapeutic. Started Bridging with IV heparin with plan on cardioversion later this week Coumadin dose per pharmacy  4. cardiomyopathy  appears euvolemic  Cont ASA and BB Holding ACEi due to AKI   5. Hyperkalemia Will order kayexalate  Full code    LOS: 9 days   Laurena Valko 06/15/2011, 5:23 PM

## 2011-06-15 NOTE — Progress Notes (Signed)
ANTICOAGULATION CONSULT NOTE - Follow Up Consult  Pharmacy Consult for Coumadin, Heparin until INR>2 Indication: atrial fibrillation  No Known Allergies  Patient Measurements: Height: 5\' 6"  (167.6 cm) Weight: 283 lb 1.1 oz (128.4 kg) IBW/kg (Calculated) : 63.8  Heparin dosing weight:95Kg  Vital Signs: Temp: 98.6 F (37 C) (01/07 0500) Temp src: Oral (01/07 0500) BP: 144/96 mmHg (01/07 0500) Pulse Rate: 102  (01/07 0500)  Labs:  Basename 06/15/11 0615 06/14/11 0705 06/13/11 0540  HGB -- -- --  HCT -- -- --  PLT -- -- --  APTT -- -- --  LABPROT 19.7* 21.3* 30.9*  INR 1.64* 1.81* 2.91*  HEPARINUNFRC -- -- --  CREATININE 4.23* 4.58* 4.56*  CKTOTAL -- -- --  CKMB -- -- --  TROPONINI -- -- --   Estimated Creatinine Clearance: 21.5 ml/min (by C-G formula based on Cr of 4.23).   Medications:  Scheduled:     . albuterol  2.5 mg Nebulization TID  . amiodarone  200 mg Oral Daily  . antiseptic oral rinse  15 mL Mouth Rinse QID  . aspirin  81 mg Oral Daily  . carvedilol  12.5 mg Oral BID WC  . chlorhexidine  15 mL Mouth/Throat BID  . ipratropium  0.5 mg Nebulization TID  . pantoprazole  40 mg Oral Q1200  . pneumococcal 23 valent vaccine  0.5 mL Intramuscular Tomorrow-1000  . sodium polystyrene  15 g Oral Once  . warfarin  2.5 mg Oral ONCE-1800  . DISCONTD: albuterol  2.5 mg Nebulization Q6H  . DISCONTD: ipratropium  0.5 mg Nebulization Q6H  . DISCONTD: pantoprazole (PROTONIX) IV  40 mg Intravenous Q24H    Assessment: 66 y/o male patient on chronic coumadin for h/o afib. INR subtherapeutic at 1.64--trending down d/t conservative dosing following initial sensitivity. Received orders to start Heparin until INR is therapeutic given plans for cardioversion later this week.  Goal of Therapy:  INR 2-3 Heparin level 0.3-0.7 iu/ml   Plan: 1. Start Heparin IV infusion at 1350 units/hr- no bolus d/t INR >1.5 2. Heparin level 6 hours after infusion starts 3. Heparin level  and CBC daily 4. Coumadin 5mg  PO today  Daniya Aramburo K. Allena Katz, PharmD, BCPS.  Clinical Pharmacist Pager [phone]. 06/15/2011 9:54 AM

## 2011-06-15 NOTE — Progress Notes (Signed)
Subjective:  On tele floor now, still coughing.  Less confused.  Not SOB  Objective:  Vital Signs in the last 24 hours: BP 144/96  Pulse 102  Temp(Src) 98.6 F (37 C) (Oral)  Resp 21  Ht 5\' 6"  (1.676 m)  Wt 128.4 kg (283 lb 1.1 oz)  BMI 45.69 kg/m2  SpO2 94% Weight increased by 4 kg ?accurate?  Physical Exam: Obese WM hoarse, in NAD Lungs:  Coarse rhonchi Cardiac:  irregular rhythm, normal S1 and S2, no S3 Abdomen:  Soft, nontender, no masses Extremities:  1+ edema, venous insufficiency changes  Intake/Output from previous day: 01/06 0701 - 01/07 0700 In: 227 [I.V.:227] Out: 1275 [Urine:1275]  Lab Results: Basic Metabolic Panel:  Basename 06/15/11 0615 06/14/11 0705  NA 142 145  K 5.6* 5.2*  CL 109 109  CO2 28 28  GLUCOSE 104* 95  BUN 90* 90*  CREATININE 4.23* 4.58*    CBC:   PROTIME: Lab Results  Component Value Date   INR 1.64* 06/15/2011   INR 1.81* 06/14/2011   INR 2.91* 06/13/2011    Telemetry: Reviewed rate relatively well controlled with increase in carvedilol dose  Assessment/Plan:  1. Acute respiratory failure due to sepsis and pneumonia improving but still needs help  2.. Atrial fibrillation better rate contol  3. Acute renal failure  Starting to turn around  4. Warfarin anticoagulation now suboptimal -  He will need to be bridged with heparin as we are considering a cardioversion later this week.   Rec:  Hold off on cardioversion until is a little stronger.continuecarvedilol.  Need to start heparin until INR therapeutic so we can do a cardioversion later.    Darden Palmer.  MD [facility] 06/15/2011, 9:11 AM

## 2011-06-15 NOTE — Progress Notes (Signed)
Speech Language/Pathology Attempted dysphagia therapy session. Pt not in room. Will reattempt tomorrow. Harlon Ditty, MA CCC-SLP [phone]

## 2011-06-15 NOTE — Progress Notes (Signed)
Pharmacy - Heparin at 1350 units / hr  Heparin level < 0.10 Afib No bleeding noted  Plan: 1) Increase heparin to 1650 units / hr 2) Heparin level 8 hours after heparin is increased  Thank you.  Okey Regal, PharmD

## 2011-06-15 NOTE — Progress Notes (Addendum)
Dr. Gonzella Lex called to clarify order to give Cody Faulkner this afternoon, order given to discontinue Cody Faulkner order for this afternoon because pt already had 30gms this AM. Order discontinued in [facility]. Julien Nordmann [facility]

## 2011-06-15 NOTE — Plan of Care (Signed)
Problem: Phase II Progression Outcomes Goal: Wean O2 if indicated Outcome: Not Progressing Pts 02 sats continue to drop to 88% when 02 removed Goal: Progress activity as tolerated unless otherwise ordered Outcome: Completed/Met Date Met:  06/15/11 Pt Ambulated with PT today and is OOB to the chair, chair alarm in use

## 2011-06-15 NOTE — Progress Notes (Signed)
Physical Therapy Treatment Patient Details Name: Cody Faulkner MRN: [id number] DOB: November 07, 1945 Today's Date: 06/15/2011  PT Assessment/Plan  PT - Assessment/Plan Comments on Treatment Session: Pt very motivated to progress with therapy. Pt able to increase greatly with ambulation this session PT Plan: Discharge plan remains appropriate PT Frequency: Min 3X/week Follow Up Recommendations: Inpatient Rehab Equipment Recommended: Defer to next venue PT Goals  Acute Rehab PT Goals PT Goal: Sit to Stand - Progress: Progressing toward goal PT Goal: Stand to Sit - Progress: Progressing toward goal PT Goal: Stand - Progress: Progressing toward goal PT Goal: Ambulate - Progress: Progressing toward goal  PT Treatment Precautions/Restrictions  Precautions Precautions: Fall Restrictions Weight Bearing Restrictions: No Mobility (including Balance) Bed Mobility Bed Mobility: No Transfers Sit to Stand: From chair/3-in-1;3: Mod assist;With upper extremity assist;With armrests Sit to Stand Details (indicate cue type and reason): A to initiate stand and to ensure balance. Pt able to sit up with cues to scoot to edge of chair and to shift weight anteriorly. Cues for safe hand placement and safe use of RW Stand to Sit: 3: Mod assist;To chair/3-in-1;With armrests;With upper extremity assist Stand to Sit Details: A to control descent into chair. Cues for safe technique Ambulation/Gait Ambulation/Gait: Yes Ambulation/Gait Assistance: 4: Min assist Ambulation/Gait Assistance Details (indicate cue type and reason): Min A for safe use of RW and for balance. Cues for LE positioning inside of RW Ambulation Distance (Feet): 40 Feet (one seated rest break) Assistive device: Rolling walker Gait Pattern: Trunk flexed;Decreased stride length Gait velocity: decreased    Exercise    End of Session PT - End of Session Equipment Utilized During Treatment: Gait belt Activity Tolerance: Patient tolerated  treatment well Patient left: in chair;with call bell in reach General Behavior During Session: [facility] for tasks performed Cognition: [facility] for tasks performed  [name], [name] 06/15/2011, 2:45 PM 06/15/2011 Fredrich Birks PTA [phone] pager [phone] office

## 2011-06-15 NOTE — Progress Notes (Signed)
Nutrition Follow-up  Diet Order:  Dysphagia 2 with nectar thick liquids PO intake: 100%  Meds: Scheduled Meds:   . albuterol  2.5 mg Nebulization TID  . amiodarone  200 mg Oral Daily  . antiseptic oral rinse  15 mL Mouth Rinse QID  . aspirin  81 mg Oral Daily  . carvedilol  12.5 mg Oral BID WC  . chlorhexidine  15 mL Mouth/Throat BID  . ipratropium  0.5 mg Nebulization TID  . pantoprazole  40 mg Oral Q1200  . pneumococcal 23 valent vaccine  0.5 mL Intramuscular Tomorrow-1000  . sodium polystyrene  15 g Oral Once  . sodium polystyrene  30 g Oral Once  . warfarin  2.5 mg Oral ONCE-1800  . warfarin  5 mg Oral ONCE-1800  . DISCONTD: albuterol  2.5 mg Nebulization Q6H  . DISCONTD: ipratropium  0.5 mg Nebulization Q6H  . DISCONTD: pantoprazole (PROTONIX) IV  40 mg Intravenous Q24H   Continuous Infusions:   . sodium chloride 20 mL/hr at 06/13/11 0701  . fentaNYL infusion INTRAVENOUS Stopped (06/10/11 0800)  . heparin 1,350 Units/hr (06/15/11 1023)   PRN Meds:.albuterol, food thickener, insulin aspart  Labs:  CMP     Component Value Date/Time   NA 142 06/15/2011 0615   K 5.6* 06/15/2011 0615   CL 109 06/15/2011 0615   CO2 28 06/15/2011 0615   GLUCOSE 104* 06/15/2011 0615   BUN 90* 06/15/2011 0615   CREATININE 4.23* 06/15/2011 0615   CALCIUM 8.8 06/15/2011 0615   PROT 5.4* 06/09/2011 0500   ALBUMIN 2.8* 06/15/2011 0615   AST 9 06/09/2011 0500   ALT 10 06/09/2011 0500   ALKPHOS 50 06/09/2011 0500   BILITOT 0.6 06/09/2011 0500   GFRNONAA 13* 06/15/2011 0615   GFRAA 15* 06/15/2011 0615     Intake/Output Summary (Last 24 hours) at 06/15/11 1042 Last data filed at 06/15/11 0900  Gross per 24 hour  Intake    827 ml  Output   1275 ml  Net   -448 ml   Re-estimated nutrition needs: 1600-1800 kcal, 75-85 gm protein Weight Status:  stable  Nutrition Dx:  Inadequate oral intake, resolved  Goal: TF goal no longer applicable. New goal: PO intake will remain >75%  Intervention:  No nutrition  interventions at this time.   Monitor:  PO intake, weights, labs, I/O's   Rudean Haskell Pager #:  [phone]

## 2011-06-16 DIAGNOSIS — R5381 Other malaise: Secondary | ICD-10-CM

## 2011-06-16 DIAGNOSIS — G92 Toxic encephalopathy: Secondary | ICD-10-CM

## 2011-06-16 DIAGNOSIS — J159 Unspecified bacterial pneumonia: Secondary | ICD-10-CM

## 2011-06-16 LAB — RENAL FUNCTION PANEL
Albumin: 2.7 g/dL — ABNORMAL LOW (ref 3.5–5.2)
Chloride: 111 mEq/L (ref 96–112)
GFR calc Af Amer: 17 mL/min — ABNORMAL LOW (ref >90)
GFR calc non Af Amer: 15 mL/min — ABNORMAL LOW (ref >90)
Phosphorus: 4.5 mg/dL (ref 2.3–4.6)
Potassium: 5.1 mEq/L (ref 3.5–5.1)
Sodium: 147 mEq/L — ABNORMAL HIGH (ref 135–145)

## 2011-06-16 LAB — HEPARIN LEVEL (UNFRACTIONATED): Heparin Unfractionated: 0.1 IU/mL — ABNORMAL LOW (ref 0.30–0.70)

## 2011-06-16 MED ORDER — WARFARIN SODIUM 5 MG PO TABS
5.0000 mg | ORAL_TABLET | Freq: Once | ORAL | Status: AC
  Administered 2011-06-16: 5 mg via ORAL
  Filled 2011-06-16: qty 1

## 2011-06-16 MED ORDER — HEPARIN SOD (PORCINE) IN D5W 100 UNIT/ML IV SOLN
2900.0000 [IU]/h | INTRAVENOUS | Status: DC
  Administered 2011-06-16: 2250 [IU]/h via INTRAVENOUS
  Administered 2011-06-17: 2900 [IU]/h via INTRAVENOUS
  Administered 2011-06-17 (×2): 2500 [IU]/h via INTRAVENOUS
  Administered 2011-06-18 – 2011-06-19 (×3): 2900 [IU]/h via INTRAVENOUS
  Filled 2011-06-16 (×14): qty 250

## 2011-06-16 NOTE — Progress Notes (Signed)
ANTICOAGULATION CONSULT NOTE - Follow Up Consult  Pharmacy Consult for Coumadin, Heparin until INR>2 Indication: atrial fibrillation  No Known Allergies  Patient Measurements: Height: 5\' 6"  (167.6 cm) Weight: 283 lb 8.2 oz (128.6 kg) IBW/kg (Calculated) : 63.8  Heparin dosing weight:95Kg  Vital Signs: Temp: 98.4 F (36.9 C) (01/08 0544) Temp src: Oral (01/08 0544) BP: 139/82 mmHg (01/08 0544) Pulse Rate: 100  (01/08 0544)  Labs:  Alvira Philips 06/16/11 0625 06/15/11 2028 06/15/11 0615 06/14/11 0705  HGB -- -- -- --  HCT -- -- -- --  PLT -- -- -- --  APTT -- -- -- --  LABPROT 18.8* -- 19.7* 21.3*  INR 1.54* -- 1.64* 1.81*  HEPARINUNFRC 0.10* <0.10* -- --  CREATININE 3.89* -- 4.23* 4.58*  CKTOTAL -- -- -- --  CKMB -- -- -- --  TROPONINI -- -- -- --   Estimated Creatinine Clearance: 23.4 ml/min (by C-G formula based on Cr of 3.89).   Medications:  Scheduled:     . albuterol  2.5 mg Nebulization TID  . amiodarone  200 mg Oral Daily  . antiseptic oral rinse  15 mL Mouth Rinse QID  . aspirin  81 mg Oral Daily  . carvedilol  12.5 mg Oral BID WC  . chlorhexidine  15 mL Mouth/Throat BID  . ipratropium  0.5 mg Nebulization TID  . pantoprazole  40 mg Oral Q1200  . pneumococcal 23 valent vaccine  0.5 mL Intramuscular Tomorrow-1000  . sodium polystyrene  30 g Oral Once  . warfarin  5 mg Oral ONCE-1800  . DISCONTD: sodium polystyrene  30 g Oral Once    Assessment: 66 y/o male patient on chronic coumadin for h/o afib. INR subtherapeutic at 1.54--trending down d/t conservative dosing following initial sensitivity. Heparin until INR is therapeutic given plans for cardioversion later this week. Heparin level continues to be subtherapeutic. CBC cancelled by MD.  Goal of Therapy:  INR 2-3 Heparin level 0.3-0.7 iu/ml   Plan: 1. Increase Heparin IV infusion to 1950 units/hr- no bolus d/t INR ~1.5 2. Heparin level 6 hours after infusion increased 3. Heparin level and CBC  daily 4. Coumadin 5mg  PO again today  Rachael Zapanta K. Allena Katz, PharmD, BCPS.  Clinical Pharmacist Pager [phone]. 06/16/2011 8:16 AM

## 2011-06-16 NOTE — Progress Notes (Signed)
Subjective:  Improving.  Not SOB and encephalopathy is better.  On IV heparin now. Objective:  Vital Signs in the last 24 hours: BP 145/89  Pulse 89  Temp(Src) 98.4 F (36.9 C) (Oral)  Resp 18  Ht 5\' 6"  (1.676 m)  Wt 128.6 kg (283 lb 8.2 oz)  BMI 45.76 kg/m2  SpO2 95% Weight increased by 4 kg ?accurate?  Physical Exam: Obese WM hoarse, in NAD Lungs:  Coarse rhonchi Cardiac:  irregular rhythm, normal S1 and S2, no S3 Abdomen:  Soft, nontender, no masses Extremities:  1+ edema, venous insufficiency changes  Intake/Output from previous day: 01/07 0701 - 01/08 0700 In: 476.1 [P.O.:120; I.V.:356.1] Out: 1001 [Urine:1000; Stool:1]  Lab Results: Basic Metabolic Panel:  Basename 06/16/11 0625 06/15/11 0615  NA 147* 142  K 5.1 5.6*  CL 111 109  CO2 29 28  GLUCOSE 96 104*  BUN 78* 90*  CREATININE 3.89* 4.23*    CBC:   PROTIME: Lab Results  Component Value Date   INR 1.54* 06/16/2011   INR 1.64* 06/15/2011   INR 1.81* 06/14/2011    Telemetry: Reviewed rate relatively well controlled with increase in carvedilol dose  Assessment/Plan:  1. Acute respiratory failure due to sepsis and pneumonia improving but still needs help  2.. Atrial fibrillation better rate contol  3. Acute renal failure improving  4. Warfarin anticoagulation now suboptimal -  Continue heparin until therapeutic  Rec:  May do cardioversion on Thursday.  Cody Faulkner.  MD [facility] 06/16/2011, 1:40 PM

## 2011-06-16 NOTE — Progress Notes (Signed)
Subjective: Patient seen and examined this am. Denies any new symptoms. Family at bedside.  Objective:  Vital signs in last 24 hours:  Filed Vitals:   06/15/11 2142 06/16/11 0544 06/16/11 0934 06/16/11 1127  BP: 143/91 139/82  145/89  Pulse: 106 100  89  Temp: 98.2 F (36.8 C) 98.4 F (36.9 C)    TempSrc: Oral Oral    Resp: 18 18    Height:      Weight:  128.6 kg (283 lb 8.2 oz)    SpO2: 92% 95% 95%     Intake/Output from previous day:   Intake/Output Summary (Last 24 hours) at 06/16/11 1304 Last data filed at 06/16/11 1002  Gross per 24 hour  Intake  596.1 ml  Output   1001 ml  Net -404.9 ml    Physical Exam:  General: elderly obese male in no acute distress HEENT: no pallor, no icterus, moist oral mucosa, no JVD, no lymphadenopathy  Heart: S1 &S2 irregular, without murmurs, rubs, gallops.  Lungs: congested with scattered ronchi  Abdomen: Soft, nontender, nondistended, positive bowel sounds.  Extremities: No clubbing cyanosis, 1+pitting edema with positive pedal pulses.  Neuro: Alert awake and oriented nonfocal.   Lab Results:  Basic Metabolic Panel:    Component Value Date/Time   NA 147* 06/16/2011 0625   K 5.1 06/16/2011 0625   CL 111 06/16/2011 0625   CO2 29 06/16/2011 0625   BUN 78* 06/16/2011 0625   CREATININE 3.89* 06/16/2011 0625   GLUCOSE 96 06/16/2011 0625   CALCIUM 8.5 06/16/2011 0625   CBC:    Component Value Date/Time   WBC 8.0 06/12/2011 0415   HGB 12.4* 06/12/2011 0415   HCT 38.1* 06/12/2011 0415   PLT 174 06/12/2011 0415   MCV 88.2 06/12/2011 0415   NEUTROABS 5.8 06/12/2011 0415   LYMPHSABS 0.6* 06/12/2011 0415   MONOABS 1.5* 06/12/2011 0415   EOSABS 0.1 06/12/2011 0415   BASOSABS 0.0 06/12/2011 0415    Recent Results (from the past 240 hour(s))  MRSA PCR SCREENING     Status: Normal   Collection Time   06/06/11  2:22 PM      Component Value Range Status Comment   MRSA by PCR NEGATIVE  NEGATIVE  Final   URINE CULTURE     Status: Normal   Collection Time   06/06/11  3:41 PM      Component Value Range Status Comment   Specimen Description URINE, CATHETERIZED   Final    Special Requests NONE   Final    Setup Time [id number]   Final    Colony Count NO GROWTH   Final    Culture NO GROWTH   Final    Report Status 01-07-202012 FINAL   Final   CULTURE, BLOOD (ROUTINE X 2)     Status: Normal   Collection Time   06/06/11  4:31 PM      Component Value Range Status Comment   Specimen Description BLOOD LEFT HAND   Final    Special Requests BOTTLES DRAWN AEROBIC ONLY 4CC   Final    Setup Time [id number]   Final    Culture NO GROWTH 5 DAYS   Final    Report Status 06/12/2011 FINAL   Final   CULTURE, RESPIRATORY     Status: Normal   Collection Time   06/06/11  5:05 PM      Component Value Range Status Comment   Specimen Description TRACHEAL ASPIRATE   Final  Special Requests NONE   Final    Gram Stain     Final    Value: MODERATE WBC PRESENT,BOTH PMN AND MONONUCLEAR     RARE SQUAMOUS EPITHELIAL CELLS PRESENT     FEW GRAM POSITIVE COCCI IN PAIRS     FEW GRAM NEGATIVE COCCI   Culture Non-Pathogenic Oropharyngeal-type Flora Isolated.   Final    Report Status 06/09/2011 FINAL   Final     Studies/Results: No results found.  Medications: Scheduled Meds:   . albuterol  2.5 mg Nebulization TID  . amiodarone  200 mg Oral Daily  . antiseptic oral rinse  15 mL Mouth Rinse QID  . aspirin  81 mg Oral Daily  . carvedilol  12.5 mg Oral BID WC  . chlorhexidine  15 mL Mouth/Throat BID  . ipratropium  0.5 mg Nebulization TID  . pantoprazole  40 mg Oral Q1200  . warfarin  5 mg Oral ONCE-1800  . warfarin  5 mg Oral ONCE-1800  . DISCONTD: sodium polystyrene  30 g Oral Once   Continuous Infusions:   . sodium chloride 20 mL/hr at 06/15/11 2345  . fentaNYL infusion INTRAVENOUS Stopped (06/10/11 0800)  . heparin 1,950 Units/hr (06/16/11 0830)   PRN Meds:.albuterol, food thickener, insulin aspart    Assessment:  66 year old male with hx of Afib  on coumadin , COPD, OSA, CAD, DCM( s/p AICD), depression intubated and admitted to ICU with Acute respiratory failure with combined hypoxic and Hypercarbic respiratory failure in Setting of CAP (community acquired pneumonia), and progressive bilateral pulmonary infiltrates and AKI.   Plan  1.acute hypoxic/ hypercarbic Respiratory failure secondary to PNA :  Patient admitted to ICU , now extubated and transferred to tele  cont o2 via [place]  cont nebs . completed 8 days of avelox on 1/5  Swallow eval done on 1/5 and 1/8  , recommends level 2 dys diet with nectar thick   2. AKI  secondary to ATN from sepsis, on ARB and possible BOO  UOP quite good .   monitor renal fn daily  no need for HD as per renal . Have signed off for now  3. Acute encephalopathy  secondary to hypercarbic resp failure, sepsis and uremia  improving  cont PT / OT  3. Afib with RVR  patient chronically on coumadin.Was held due to coagulapathy , now resumed and INR has been sub therapeutic. Cardiology following and plan on cardioversion later this week. Have bridged with IV heparin since 1/7 cardiology following  Cont amiodarone  Cont coreg bid. coreg dose increased further to to 12.5 mg bid , now rate better control  Coumadin dose per pharmacy   4. cardiomyopathy  appears euvolemic  Cont ASA and BB Holding ACEi due to AKI   5. Hyperkalemia  gien kayexalate, k of 5.1 today Monitor in am  Full code  Consults: Cardiology Donnie Aho) Renal ( [place]--have signed off for now and needs outpt follow up) Urology ( MacSherilyn Cooter) --saw for BOO on 12/29 Inpatient rehab consult ( called on 1/8)  Pending issues  cardioversion per cardiology later this week  inpatient rehab consult and evaluation ( consulted on 1/8)  LOS: 10 days   Kamariah Fruchter 06/16/2011, 1:04 PM

## 2011-06-16 NOTE — Progress Notes (Signed)
Utilization review completed.  

## 2011-06-16 NOTE — Progress Notes (Signed)
Speech Language/Pathology Speech Pathology: Dysphagia Treatment Note  Patient was observed with :  Nectar liquids.  Patient was noted to have s/s of aspiration : No:    Lung Sounds:  Course crackles bilaterally Temperature: afebrile  Patient required: min verbal cues  Clinical Impression:  Pt provided with several sips of nectar-thick liquids, upon which O2 sats dropped to 60s per tech monitoring from nursing station.  Pt's behavior did not change, nor did he report discomfort - question accuracy of reading.  Pt's phonation is significantly impaired, perceptually interpreted as glottal fry.  This raises the question of laryngeal dysfunction as being source of dysphagia and compromised airway protection as viewed during 06/13/11 MBS.   Recommendations:  Consider ENT evaluation Continue Dysphagia 2, nectar-thick liquids. Recommend FEES prior to advancing PO diet. SLP will follow.  Alena Blankenbeckler L. [place], [place] CCC/SLP Pager [phone]   Pain:   none Intervention Required:   No   Goals: progressing

## 2011-06-16 NOTE — Progress Notes (Signed)
1300  Pt ate 2-3 bites of dys 2 diet with nectar thick cranberry juice, was sitting as erect as possible givin pt abd size, alert, oriented, pulse ox did not fall but heart rate elevated when vomited foot up.  Suction working at bedside, pt refused any food after this.  MD Donnie Aho informed when making rounds that pt appetite & intake is poor.  Bonney Leitz RN

## 2011-06-16 NOTE — Progress Notes (Signed)
Patient and family agreeable to SNF search. CSW Psychosocial Assessment  placed in shadow chart in [facility]. FL2 completed and faxed to area SNF's [facility]) Will proceed with SNF search and advise. Reece Levy, MSW, Theresia Majors [phone]

## 2011-06-16 NOTE — Progress Notes (Signed)
ANTICOAGULATION CONSULT NOTE - Follow Up Consult  Pharmacy Consult for Coumadin, Heparin until INR>2 Indication: atrial fibrillation  No Known Allergies  Patient Measurements: Height: 5\' 6"  (167.6 cm) Weight: 283 lb 8.2 oz (128.6 kg) IBW/kg (Calculated) : 63.8  Heparin dosing weight:95Kg  Vital Signs: Temp: 98.4 F (36.9 C) (01/08 0544) Temp src: Oral (01/08 0544) BP: 145/89 mmHg (01/08 1127) Pulse Rate: 89  (01/08 1127)  Labs:  Alvira Philips 06/16/11 1621 06/16/11 0625 06/15/11 2028 06/15/11 0615 06/14/11 0705  HGB -- -- -- -- --  HCT -- -- -- -- --  PLT -- -- -- -- --  APTT -- -- -- -- --  LABPROT -- 18.8* -- 19.7* 21.3*  INR -- 1.54* -- 1.64* 1.81*  HEPARINUNFRC 0.19* 0.10* <0.10* -- --  CREATININE -- 3.89* -- 4.23* 4.58*  CKTOTAL -- -- -- -- --  CKMB -- -- -- -- --  TROPONINI -- -- -- -- --   Estimated Creatinine Clearance: 23.4 ml/min (by C-G formula based on Cr of 3.89).   Medications:  Scheduled:     . albuterol  2.5 mg Nebulization TID  . amiodarone  200 mg Oral Daily  . antiseptic oral rinse  15 mL Mouth Rinse QID  . aspirin  81 mg Oral Daily  . carvedilol  12.5 mg Oral BID WC  . chlorhexidine  15 mL Mouth/Throat BID  . ipratropium  0.5 mg Nebulization TID  . pantoprazole  40 mg Oral Q1200  . warfarin  5 mg Oral ONCE-1800  . warfarin  5 mg Oral ONCE-1800  . DISCONTD: sodium polystyrene  30 g Oral Once    Assessment: 66 y/o male patient on chronic coumadin for h/o afib. INR subtherapeutic at 1.54--trending down d/t conservative dosing following initial sensitivity. Heparin until INR is therapeutic given plans for cardioversion later this week. Heparin level 0.19 after rate increased to 1950 units/hr is still subtherapeutic.  No bleeding reported.   Goal of Therapy:  INR 2-3 Heparin level 0.3-0.7 iu/ml   Plan: 1. Increase Heparin IV infusion to 2250 units/hr- Heparin level 6 hours after infusion increased 3. Heparin level and CBC daily 4. Coumadin  5mg  PO again today already ordered Herby Abraham Pager: [phone]  06/16/2011 5:17 PM

## 2011-06-16 NOTE — Consult Note (Signed)
Physical Medicine and Rehabilitation Consult Reason for Consult: Deconditioning Referring Phsyician: Dr. Gonzella Lex    HPI: Cody Faulkner is an 66 y.o. male with history of OSA, COPD, atrial fibrillation, admitted 12/29 past collapsing from respiratory arrest.  Noted to have acute hypercarbic and hypoxic respiratory failure requiring intubation.  Patient pan cultured and started on IV antibiotics for sepsis due to  CAP. Required foley placement by Dr. Brunilda Payor.  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  Nephrology consulted for input and felt patient with multifactorial oliguric renal failure and recommending monitoring UOP. Ace held and treated with diuretics with improvement in renal status.  Extubated 01/02.  Dr. Donnie Aho consulted for input on rapid atrial fibrillation and recommended BB for rate control and question of cardioversion later this week.  MBS 01/05 with penetration and aspiration of nectar with large boluses.  Patient started on D2 diet and nectar thick liquids with full supervision. Patient with significantly impaired phonation with question of laryngeal dysfunction.   Issues with confusion resolving.  Therapies initiated on 01/04 and patient noted to be deconditioned. MD, PT recommending CIR.  Review of Systems  Respiratory: Positive for cough, shortness of breath and wheezing.   Cardiovascular: Positive for leg swelling. Negative for palpitations.  Musculoskeletal: Positive for joint pain (left knee).  Neurological: Positive for weakness.   Past Medical History  Diagnosis Date  . Pneumonia, community acquired     bilateral bibasilar   . COPD exacerbation   . Atrial fibrillation     Cardioversion 2007  . Hypertension   . OSA (obstructive sleep apnea)     declines mask  . Depression   . Gouty arthritis   . CAD (coronary artery disease)     predominantly single vessel  . Ventricular tachycardia     s/p defibrillator-Medtronic  EnTrust [id number]  . Dilated cardiomyopathy     s/p defibrillator Medtronic EnTrust [phone]  . Asthma   . 1914 Lead   . Implantable Defibrillator     Medtronic Entrust  . Chronic systolic heart failure   . Hypothyroidism   . Acute renal failure 06/06/2011  . Solitary kidney 06/05/2006   Past Surgical History  Procedure Date  . Cardiac defibrillator placement 2007    Medtronic EnTrust [phone]  . Nephrectomy 1976    Right  . Arthroscopic knee surgery   . Cardiac catheterization 2007    60% Stenosis mid-CFX  . Insert / replace / remove pacemaker    Family History  Problem Relation Age of Onset  . Heart disease Other     uncle  . Diabetes Other     uncle   Social History:Single.  Lives alone.  Independent but limited by DOE.  He helps his cousin take care of chickens. Cousin lives a few miles away but a lot of family members in the neighborhood.   He  reports that he has never smoked. He has never used smokeless tobacco. He reports that he drinks alcohol. He reports that he does not use illicit drugs.   No Known Allergies  Prior to Admission medications   Medication Sig Start Date End Date Taking? Authorizing Provider  acetaminophen (TYLENOL EX ST ARTHRITIS PAIN) 500 MG tablet Take 500 mg by mouth every 6 (six) hours as needed. For pain    Yes Historical Provider, MD  albuterol (PROVENTIL HFA;VENTOLIN HFA) 108 (90 BASE) MCG/ACT inhaler Inhale 2 puffs into the lungs every 4 (four) hours as needed. For shortness of  breath    Yes Historical Provider, MD  allopurinol (ZYLOPRIM) 300 MG tablet Take 300 mg by mouth daily.    Yes Historical Provider, MD  amiodarone (PACERONE) 200 MG tablet Take 200 mg by mouth daily.     Yes Historical Provider, MD  aspirin 81 MG chewable tablet Chew 81 mg by mouth daily.     Yes Historical Provider, MD  benazepril (LOTENSIN) 40 MG tablet Take 40 mg by mouth daily.     Yes Historical Provider, MD  budesonide-formoterol (SYMBICORT) 160-4.5 MCG/ACT inhaler Inhale 2  puffs into the lungs 2 (two) times daily.     Yes Historical Provider, MD  carvedilol (COREG) 25 MG tablet Take 25 mg by mouth 2 (two) times daily with a meal.     Yes Historical Provider, MD  fenofibrate micronized (LOFIBRA) 134 MG capsule Take 134 mg by mouth daily.    Yes Historical Provider, MD  furosemide (LASIX) 40 MG tablet Take 40 mg by mouth daily.    Yes Historical Provider, MD  guaiFENesin (MUCINEX) 600 MG 12 hr tablet Take 600 mg by mouth 2 (two) times daily.     Yes Historical Provider, MD  isosorbide mononitrate (IMDUR) 30 MG 24 hr tablet Take 30 mg by mouth daily.     Yes Historical Provider, MD  levothyroxine (SYNTHROID, LEVOTHROID) 100 MCG tablet Take 100 mcg by mouth daily.    Yes Historical Provider, MD  loratadine (CLARITIN) 10 MG tablet Take 10 mg by mouth daily.     Yes Historical Provider, MD  potassium chloride SA (K-DUR,KLOR-CON) 20 MEQ tablet Take 20 mEq by mouth daily.    Yes Historical Provider, MD  warfarin (COUMADIN) 5 MG tablet Take 5 mg by mouth daily.    Yes Historical Provider, MD   Scheduled Medications:    . albuterol  2.5 mg Nebulization TID  . amiodarone  200 mg Oral Daily  . antiseptic oral rinse  15 mL Mouth Rinse QID  . aspirin  81 mg Oral Daily  . carvedilol  12.5 mg Oral BID WC  . chlorhexidine  15 mL Mouth/Throat BID  . ipratropium  0.5 mg Nebulization TID  . pantoprazole  40 mg Oral Q1200  . warfarin  5 mg Oral ONCE-1800  . warfarin  5 mg Oral ONCE-1800  . DISCONTD: sodium polystyrene  30 g Oral Once   PRN MED's: albuterol, food thickener, insulin aspart Home: Home Living Lives With: Alone Receives Help From: Family Type of Home: Mobile home (pt plans to stay with his cousin Cody Faulkner on d/c??) Home Adaptive Equipment: Walker - rolling Additional Comments: Difficult to understand pt because he is hoarse, I was able to get that he can stay with his cousin Cody Faulkner that lives in a mobile home and that they don't work  Functional History: Prior  Function Level of Independence: Independent with basic ADLs;Independent with homemaking with ambulation;Independent with gait Vocation: Full time employment Functional Status:  Mobility: Bed Mobility Bed Mobility: No Supine to Sit: 3: Mod assist;HOB elevated (Comment degrees) (40 degrees) Supine to Sit Details (indicate cue type and reason): pt initiates independently but needing cueing to sequence, mid transfer pt reaching for PT's arm and pulling a good modA to bring self erect EOB Sitting - Scoot to Edge of Bed: 4: Min assist Sitting - Scoot to [place] of Bed Details (indicate cue type and reason): increased difficulty with follow through; use of pad to assist with scoot Transfers Transfers: Yes Sit to Stand: From chair/3-in-1;3: Mod  assist;With upper extremity assist;With armrests Sit to Stand Details (indicate cue type and reason): A to initiate stand and to ensure balance. Pt able to sit up with cues to scoot to edge of chair and to shift weight anteriorly. Cues for safe hand placement and safe use of RW Stand to Sit: 3: Mod assist;To chair/3-in-1;With armrests;With upper extremity assist Stand to Sit Details: A to control descent into chair. Cues for safe technique Stand Pivot Transfers: 1: +2 Total assist Stand Pivot Transfer Details (indicate cue type and reason): +2totalpt50% with faciliation at hips for full extension and upright posture; cues to sequencing pivot; pt with more difficulty bearing weight through LLE so decreased stance time but relying on RW to [employer] Ambulation/Gait Ambulation/Gait: Yes Ambulation/Gait Assistance: 4: Min assist Ambulation/Gait Assistance Details (indicate cue type and reason): Min A for safe use of RW and for balance. Cues for LE positioning inside of RW Ambulation Distance (Feet): 40 Feet (one seated rest break) Assistive device: Rolling walker Gait Pattern: Trunk flexed;Decreased stride length Gait velocity: decreased    ADL:     Cognition: Cognition Arousal/Alertness: Awake/alert Orientation Level: Oriented to person;Oriented to place;Oriented to time Cognition Arousal/Alertness: Awake/alert Overall Cognitive Status: Difficult to assess Difficult to assess due to: hard of hearing/deaf (pt very hoarse and raspy from intubation) Orientation Level: Oriented to person;Oriented to place;Oriented to time Safety/Judgement: Decreased awareness of safety precautions;Decreased safety judgement for tasks assessed Decreased Safety/Judgement: Impulsive Safety/Judgement - Other Comments: Pt very impulsively trying to jump up from the bed regardless of verbal cueing, hearing may be an issue for him however  Blood pressure 145/89, pulse 89, temperature 98.4 F (36.9 C), temperature source Oral, resp. rate 18, height 5\' 6"  (1.676 m), weight 128.6 kg (283 lb 8.2 oz), SpO2 95.00%. Physical Exam  Constitutional: He is oriented to person, place, and time. He appears well-developed and well-nourished.       Morbidly obese  HENT:  Head: Normocephalic and atraumatic.       Speech dysarthric and very difficult to understand. Voice quality quite hoarse as well with poor phonation   Cardiovascular: An irregularly irregular rhythm present.  Pulmonary/Chest: Effort normal. He has wheezes. He has rales.  Abdominal: Soft. Bowel sounds are normal.  Lymphadenopathy:       Bilateral LE with 2+ pitting edema. Chronic vascular, pigmentation changes are seen.   Neurological: He is alert and oriented to person, place, and time. A cranial nerve deficit (dysphonia) is present.       Pt slow to arouse.  Could follow most simple  Commands.  Awareness and insight fair.  Could tell me where he was and why he was in the hopsital.  No focal sensory deficits seen.  Strength grossly 4/5 UE, and 2/5 prox LE to 3/5 distally.  DTR's 1+.  Skin:       Stasis changes bilateral shins.  Psychiatric: He has a normal mood and affect. He is slowed.    Results  for orders placed during the hospital encounter of 06/06/11 (from the past 24 hour(s))  HEPARIN LEVEL (UNFRACTIONATED)     Status: Abnormal   Collection Time   06/15/11  8:28 PM      Component Value Range   Heparin Unfractionated <0.10 (*) 0.30 - 0.70 (IU/mL)  RENAL FUNCTION PANEL     Status: Abnormal   Collection Time   06/16/11  6:25 AM      Component Value Range   Sodium 147 (*) 135 - 145 (mEq/L)  Potassium 5.1  3.5 - 5.1 (mEq/L)   Chloride 111  96 - 112 (mEq/L)   CO2 29  19 - 32 (mEq/L)   Glucose, Bld 96  70 - 99 (mg/dL)   BUN 78 (*) 6 - 23 (mg/dL)   Creatinine, Ser 1.61 (*) 0.50 - 1.35 (mg/dL)   Calcium 8.5  8.4 - 09.6 (mg/dL)   Phosphorus 4.5  2.3 - 4.6 (mg/dL)   Albumin 2.7 (*) 3.5 - 5.2 (g/dL)   GFR calc non Af Amer 15 (*) >90 (mL/min)   GFR calc Af Amer 17 (*) >90 (mL/min)  HEPARIN LEVEL (UNFRACTIONATED)     Status: Abnormal   Collection Time   06/16/11  6:25 AM      Component Value Range   Heparin Unfractionated 0.10 (*) 0.30 - 0.70 (IU/mL)  PROTIME-INR     Status: Abnormal   Collection Time   06/16/11  6:25 AM      Component Value Range   Prothrombin Time 18.8 (*) 11.6 - 15.2 (seconds)   INR 1.54 (*) 0.00 - 1.49    No results found.  Assessment/Plan: Diagnosis: deconditioning related to AVD respiratory failure and prolonged hospital course, morbid obesity 1. Does the need for close, 24 hr/day medical supervision in concert with the patient's rehab needs make it unreasonable for this patient to be served in a less intensive setting? Yes 2. Co-Morbidities requiring supervision/potential complications: CM, CAP, renal failure, CAD 3. Due to bladder management, bowel management, safety, skin/wound care, disease management, medication administration, pain management and patient education, does the patient require 24 hr/day rehab nursing? Yes 4. Does the patient require coordinated care of a physician, rehab nurse, PT (1-2 hrs/day, 5 days/week), OT (1-2 hrs/day, 5 days/week) and  SLP (1-2 hrs/day, 5 days/week) to address physical and functional deficits in the context of the above medical diagnosis(es)? Yes Addressing deficits in the following areas: balance, bathing, bowel/bladder control, cognition, dressing, endurance, feeding, grooming, locomotion, psychosocial adjustment, speech, strength, swallowing, toileting and transferring 5. Can the patient actively participate in an intensive therapy program of at least 3 hrs of therapy per day at least 5 days per week? Potentially 6. The potential for patient to make measurable gains while on inpatient rehab is good 7. Anticipated functional outcomes upon discharge from inpatients are supervision  PT, supervision to minimal assist OT, supervision to minimal assist SLP 8. Estimated rehab length of stay to reach the above functional goals is: 2-3 weeks? 9. Does the patient have adequate social supports to accommodate these discharge functional goals? Potentially 10. Anticipated D/C setting: Home 11. Anticipated post D/C treatments: HH therapy 12. Overall Rehab/Functional Prognosis: excellent  RECOMMENDATIONS: This patient's condition is appropriate for continued rehabilitative care in the following setting: CIR Patient has agreed to participate in recommended program. Yes Note that insurance prior authorization may be required for reimbursement for recommended care.  Comment: Need to establish social supports.  He will need 24 assist at discharge.   ZTS 06/16/2011

## 2011-06-17 ENCOUNTER — Encounter (HOSPITAL_COMMUNITY): Payer: Self-pay | Admitting: General Practice

## 2011-06-17 DIAGNOSIS — E875 Hyperkalemia: Secondary | ICD-10-CM

## 2011-06-17 LAB — CBC
HCT: 40 % (ref 39.0–52.0)
Hemoglobin: 12.5 g/dL — ABNORMAL LOW (ref 13.0–17.0)
MCH: 28.8 pg (ref 26.0–34.0)
RBC: 4.34 MIL/uL (ref 4.22–5.81)

## 2011-06-17 LAB — HEPARIN LEVEL (UNFRACTIONATED)
Heparin Unfractionated: 0.32 IU/mL (ref 0.30–0.70)
Heparin Unfractionated: 0.14 IU/mL — ABNORMAL LOW (ref 0.30–0.70)

## 2011-06-17 LAB — BASIC METABOLIC PANEL
CO2: 32 mEq/L (ref 19–32)
Calcium: 9 mg/dL (ref 8.4–10.5)
Chloride: 108 mEq/L (ref 96–112)
Creatinine, Ser: 3.34 mg/dL — ABNORMAL HIGH (ref 0.50–1.35)
GFR calc Af Amer: 22 mL/min — ABNORMAL LOW (ref >90)
GFR calc non Af Amer: 19 mL/min — ABNORMAL LOW (ref >90)
Glucose, Bld: 97 mg/dL (ref 70–99)
Potassium: 5.4 mEq/L — ABNORMAL HIGH (ref 3.5–5.1)
Sodium: 144 mEq/L (ref 135–145)

## 2011-06-17 LAB — RENAL FUNCTION PANEL
BUN: 66 mg/dL — ABNORMAL HIGH (ref 6–23)
CO2: 31 mEq/L (ref 19–32)
Calcium: 8.8 mg/dL (ref 8.4–10.5)
Creatinine, Ser: 3.33 mg/dL — ABNORMAL HIGH (ref 0.50–1.35)
GFR calc Af Amer: 21 mL/min — ABNORMAL LOW (ref >90)
Glucose, Bld: 99 mg/dL (ref 70–99)

## 2011-06-17 MED ORDER — SODIUM CHLORIDE 0.45 % IV SOLN: INTRAVENOUS | Status: DC

## 2011-06-17 MED ORDER — ACETAMINOPHEN 500 MG PO TABS
500.0000 mg | ORAL_TABLET | Freq: Four times a day (QID) | ORAL | Status: DC | PRN
  Administered 2011-06-19: 500 mg via ORAL
  Filled 2011-06-17 (×2): qty 1

## 2011-06-17 MED ORDER — WARFARIN SODIUM 5 MG PO TABS
5.0000 mg | ORAL_TABLET | Freq: Once | ORAL | Status: AC
  Administered 2011-06-17: 5 mg via ORAL
  Filled 2011-06-17: qty 1

## 2011-06-17 MED ORDER — CALCIUM GLUCONATE 10 % IV SOLN
1.0000 g | Freq: Once | INTRAVENOUS | Status: AC
  Administered 2011-06-17: 1 g via INTRAVENOUS
  Filled 2011-06-17: qty 10

## 2011-06-17 MED ORDER — SODIUM POLYSTYRENE SULFONATE 15 GM/60ML PO SUSP
30.0000 g | Freq: Every day | ORAL | Status: DC
  Administered 2011-06-17 – 2011-06-18 (×2): 30 g via ORAL
  Filled 2011-06-17 (×3): qty 120

## 2011-06-17 MED ORDER — HYDROCORTISONE 1 % EX CREA
1.0000 "application " | TOPICAL_CREAM | Freq: Three times a day (TID) | CUTANEOUS | Status: DC | PRN
  Filled 2011-06-17: qty 28

## 2011-06-17 NOTE — Progress Notes (Signed)
Occupational Therapy Evaluation Patient Details Name: Cody Faulkner MRN: [id number] DOB: [birth year] Today's Date: 06/17/2011  Problem List:  Patient Active Problem List  Diagnoses  . 9147 Lead  . Atrial Fibrillation  . AICD in situ  . Dilated cardiomyopathy  . Chronic systolic heart failure  . Ventricular tachycardia  . CAP (community acquired pneumonia)  . Hypothyroidism  . Asthma  . Sepsis  . Acute renal failure  . Morbid obesity  . Long-term (current) use of anticoagulants  . CAD (coronary artery disease)  . Sleep apnea  . Hypertensive heart disease without CHF  . Solitary kidney    Past Medical History:  Past Medical History  Diagnosis Date  . Pneumonia, community acquired     bilateral bibasilar   . COPD exacerbation   . Atrial fibrillation     Cardioversion 2007  . Hypertension   . OSA (obstructive sleep apnea)     declines mask  . Depression   . Gouty arthritis   . CAD (coronary artery disease)     predominantly single vessel  . Ventricular tachycardia     s/p defibrillator-Medtronic EnTrust D154  . Dilated cardiomyopathy     s/p defibrillator Medtronic EnTrust [phone]  . Asthma   . 6213 Lead   . Implantable Defibrillator     Medtronic Entrust  . Chronic systolic heart failure   . Hypothyroidism   . Acute renal failure 06/06/2011  . Solitary kidney 06/05/2006   Past Surgical History:  Past Surgical History  Procedure Date  . Cardiac defibrillator placement 2007    Medtronic EnTrust [phone]  . Nephrectomy 1976    Right  . Arthroscopic knee surgery   . Cardiac catheterization 2007    60% Stenosis mid-CFX  . Insert / replace / remove pacemaker     OT Assessment/Plan/Recommendation Clinical Impression Statement:66 year old male admitted with acute respiratory failure due to sepsis and pneumonia.  Patient will be transferred this afternoon and have cardioversion tomorrow at 1300.  Patient presents to OT with generalized weakness, poor respiratory effort  with SpO2 dropping to 74% with activity, and left knee pain.  Patient will benefit from skilled OT in acute setting to maximize mobility, activity tolerance, and independence with BADL. OT Recommendation/Assessment: Patient will need skilled OT in the acute care venue OT Problem List: Decreased strength;Decreased range of motion;Decreased activity tolerance;Decreased safety awareness;Impaired balance (sitting and/or standing);Decreased knowledge of use of DME or AE;Decreased knowledge of precautions;Pain;Obesity Barriers to Discharge: Decreased caregiver support Barriers to Discharge Comments: Discharge plan to SNF OT Therapy Diagnosis : Generalized weakness OT Plan OT Frequency: Min 2X/week OT Treatment/Interventions: Self-care/ADL training;Therapeutic exercise;Energy conservation;DME and/or AE instruction;Therapeutic activities;Patient/family education;Balance training OT Recommendation Follow Up Recommendations: Skilled nursing facility Equipment Recommended: Defer to next venue Individuals Consulted Consulted and Agree with Results and Recommendations: Patient OT Goals Acute Rehab OT Goals OT Goal Formulation: With patient Time For Goal Achievement: 2 weeks ADL Goals Pt Will Perform Grooming: with min assist;Standing at sink ADL Goal: Grooming - Progress: Not met Pt Will Perform Upper Body Bathing: with set-up;with supervision;Sitting, edge of bed ADL Goal: Upper Body Bathing - Progress: Not met Pt Will Perform Lower Body Bathing: Sit to stand from bed;with mod assist;with adaptive equipment ADL Goal: Lower Body Bathing - Progress: Not met Pt Will Perform Upper Body Dressing: with supervision;Sitting, bed ADL Goal: Upper Body Dressing - Progress: Not met Pt Will Perform Lower Body Dressing: Sit to stand from bed;with adaptive equipment;with mod assist ADL Goal: Lower Body Dressing -  Progress: Not met Pt Will Transfer to Toilet: with mod assist;Extra wide 3-in-1;Grab bars ADL Goal:  Toilet Transfer - Progress: Not met Pt Will Perform Toileting - Clothing Manipulation: with min assist ADL Goal: Toileting - Clothing Manipulation - Progress: Not met Pt Will Perform Toileting - Hygiene: with mod assist  OT Evaluation Precautions/Restrictions  Precautions Precautions: Fall Restrictions Weight Bearing Restrictions: No Prior Functioning Home Living Lives With: Alone Receives Help From: Family Type of Home: Mobile home (pt plans to stay with his cousin Cody John on d/c??) Home Adaptive Equipment: Walker - rolling Additional Comments: Discharge plan is SNF secondary to limited assist available at home  Prior Function Level of Independence: Independent with basic ADLs;Independent with homemaking with ambulation;Independent with gait Vocation: Full time employment ADL ADL Grooming: Simulated;Minimal assistance Where Assessed - Grooming: Sitting, bed;Supine, head of bed up Upper Body Bathing: Simulated;Moderate assistance;Minimal assistance Where Assessed - Upper Body Bathing: Supine, head of bed up;Sitting, bed Lower Body Bathing: Simulated;+2 Total assistance Where Assessed - Lower Body Bathing: Supine, head of bed up;Sitting, bed;Sit to stand from bed Upper Body Dressing: Simulated;Moderate assistance Where Assessed - Upper Body Dressing: Supine, head of bed up;Sitting, bed Lower Body Dressing: Simulated;+2 Total assistance Where Assessed - Lower Body Dressing: Sitting, bed;Sit to stand from bed Toilet Transfer: Performed;+2 Total assistance Toilet Transfer Details (indicate cue type and reason): verbals cues for hand placement, physical assist for timing to move RW, and weight shifts Toilet Transfer Method: Stand pivot Toilet Transfer Equipment: Comfort height toilet;Grab bars Toileting - Clothing Manipulation: Performed;Minimal assistance Where Assessed - Toileting Clothing Manipulation: Sit on 3-in-1 or toilet Toileting - Hygiene: Performed;+1 Total  assistance Where Assessed - Toileting Hygiene: Sit on 3-in-1 or toilet Tub/Shower Transfer: Not assessed Tub/Shower Transfer Method: Not assessed Equipment Used: Rolling walker Ambulation Related to ADLs: Total +2 with RW patient's effort varried from ~25%~60% Extremity Assessment RUE Assessment RUE Assessment: Exceptions to [facility] RUE AROM (degrees) RUE Overall AROM Comments: AROM WFL except end ranges of shouder flexion and abduction (~120 degrees) RUE Strength RUE Overall Strength Comments: overall 3+/5 - 4/5 LUE Assessment LUE Assessment: Exceptions to WFL LUE AROM (degrees) LUE Overall AROM Comments: overall WFL except end ranges of shoulder flex and abduction (~120 degrees) LUE Strength LUE Overall Strength Comments: overall 3+/ - 4/5 Mobility  Bed Mobility Bed Mobility: Yes Supine to Sit: 4: Min assist Sitting - Scoot to Edge of Bed: 4: Min assist Transfers Transfers: Yes Sit to Stand: From chair/3-in-1;With upper extremity assist;With armrests;1: +2 Total assist Sit to Stand Details (indicate cue type and reason): Mod assist 1 X with sit to stand from recliner with cues for technique.  With fatique patient requried Total +2 Stand to Sit: 3: Mod assist;To chair/3-in-1;With armrests;With upper extremity assist Stand to Sit Details: Mod assist 1 X with stand to sit from recliner with cues for technique.  With fatique patient requried Total +2 End of Session OT - End of Session Activity Tolerance: Patient limited by fatigue;Patient limited by pain;Treatment limited secondary to medical complications (Comment) (difficulty keeping SpO2 up with activity) Patient left: in chair;with call bell in reach Nurse Communication: Mobility status for transfers;Mobility status for ambulation General Behavior During Session: [facility] for tasks performed Cognition: [facility] for tasks performed   Jeanetta Alonzo 06/17/2011, 3:06 PM  [phone]

## 2011-06-17 NOTE — Progress Notes (Signed)
ANTICOAGULATION CONSULT NOTE - Follow Up Consult  Pharmacy Consult for Heparin/Coumadin Indication: atrial fibrillation  Heparin off per RN since he arrived on the unit (2000).  She doesn't know how long it has been off but reveals that patient has not had any IV access.   PICC line placed at 18:48PM for venous access without noted complication.  Now awaiting IV pump to restart IV Heparin.  I spoke with the nurse and she will restart at the same rate of 2900 units/hr.  I will reschedule the heparin level for AM labs.  Nadara Mustard, PharmD., MS Clinical Pharmacist Pager [phone] 09Jan2013 [phone]

## 2011-06-17 NOTE — Progress Notes (Signed)
Pt transferred via bed to 2003, report called,

## 2011-06-17 NOTE — Progress Notes (Signed)
Patient discussed at the Long Length of Stay Cody Faulkner Weeks 06/17/2011  

## 2011-06-17 NOTE — Progress Notes (Signed)
Rehab admissions - Evaluated for possible admission.  Spoke with patient who deferred decision making to his cousin.  I called and spoke with Renato Gails, patient's cousin.  Patient lives alone and will not have adequate support at home after a potential short inpatient rehab stay.  Recommend continued pursuit of SNF level therapies.  Pager [phone]

## 2011-06-17 NOTE — Plan of Care (Signed)
Problem: Phase II Progression Outcomes Goal: Walk in hall or up in chair TID Outcome: Progressing Up to commode then resting in recliner with OT/PT co-treat

## 2011-06-17 NOTE — Progress Notes (Signed)
ANTICOAGULATION CONSULT NOTE - Follow Up Consult  Pharmacy Consult for Heparin/Coumadin Indication: atrial fibrillation  No Known Allergies  Patient Measurements: Height: 5\' 6"  (167.6 cm) Weight: 282 lb 10.1 oz (128.2 kg) IBW/kg (Calculated) : 63.8   Vital Signs: Temp: 98.4 F (36.9 C) (01/09 0500) Temp src: Oral (01/09 0500) BP: 137/87 mmHg (01/09 0500) Pulse Rate: 97  (01/09 0500)  Labs:  Cody Faulkner 06/17/11 0910 06/17/11 0547 06/16/11 2323 06/16/11 0625 06/15/11 0615  HGB -- 12.5* -- -- --  HCT -- 40.0 -- -- --  PLT -- 204 -- -- --  APTT -- -- -- -- --  LABPROT -- 21.3* -- 18.8* 19.7*  INR -- 1.81* -- 1.54* 1.64*  HEPARINUNFRC 0.14* 0.32 0.26* -- --  CREATININE -- 3.33* -- 3.89* 4.23*  CKTOTAL -- -- -- -- --  CKMB -- -- -- -- --  TROPONINI -- -- -- -- --   Estimated Creatinine Clearance: 27.3 ml/min (by C-G formula based on Cr of 3.33).   Medications:  Scheduled:    . albuterol  2.5 mg Nebulization TID  . amiodarone  200 mg Oral Daily  . antiseptic oral rinse  15 mL Mouth Rinse QID  . aspirin  81 mg Oral Daily  . carvedilol  12.5 mg Oral BID WC  . chlorhexidine  15 mL Mouth/Throat BID  . ipratropium  0.5 mg Nebulization TID  . pantoprazole  40 mg Oral Q1200  . warfarin  5 mg Oral ONCE-1800    Assessment: 66 y/o male receiving coumadin/heparin for h/o afib. INR subtherapeutic, trending up, no bleeding reported. Noted drug interaction with amiodarone.  Subtherapeutic heparin level, requiring higher infusion rate d/t obesity, will increase further.  Goal of Therapy:  INR 2-3 Xa=0.3-0.7   Plan:  Increase heparin gtt to 2900 units/hr, check 8 hour heparin level, and repeat coumadin 5mg  today. Continue heparin until INR>2.0.  Verlene Mayer, PharmD, BCPS Pager [phone] 06/17/2011,10:31 AM

## 2011-06-17 NOTE — Progress Notes (Signed)
Utilization review completed.  

## 2011-06-17 NOTE — Progress Notes (Signed)
Physical Therapy Treatment Patient Details Name: Cody Faulkner MRN: [id number] DOB: 1946-04-02 Today's Date: 06/17/2011  PT Assessment/Plan  PT - Assessment/Plan Comments on Treatment Session: Pt still motivated and impulsive without regards to decreased oxygen sats. Pt dropped to 74% on 4L during ambulation and was put on 6L and remained above 85%. At end of session patient seated with 4L and 94% O2 sats.  PT Plan: Discharge plan remains appropriate PT Frequency: Min 3X/week Follow Up Recommendations: Inpatient Rehab Equipment Recommended: Defer to next venue PT Goals  Acute Rehab PT Goals PT Goal: Rolling Supine to Right Side - Progress: Progressing toward goal PT Goal: Rolling Supine to Left Side - Progress: Progressing toward goal PT Goal: Supine/Side to Sit - Progress: Progressing toward goal PT Goal: Sit to Stand - Progress: Progressing toward goal PT Goal: Stand to Sit - Progress: Progressing toward goal PT Transfer Goal: Bed to Chair/Chair to Bed - Progress: Progressing toward goal PT Goal: Stand - Progress: Progressing toward goal  PT Treatment Precautions/Restrictions  Precautions Precautions: Fall Restrictions Weight Bearing Restrictions: No Mobility (including Balance) Bed Mobility Bed Mobility: Yes Supine to Sit: 4: Min assist Supine to Sit Details (indicate cue type and reason): A to pull up using PTAs arm. Cues for sequency and technique with correct positioning Sitting - Scoot to Edge of Bed: 4: Min assist Sitting - Scoot to Edge of Bed Details (indicate cue type and reason): Cues for weight shifting for efficient scooting Transfers Sit to Stand: 1: +2 Total assist;From chair/3-in-1;From toilet;With upper extremity assist;With armrests;Patient percentage (comment) (40%) Sit to Stand Details (indicate cue type and reason): Pt only Mod A from recliner with use of armrest however when seated on lower surface patient required increase assistance to initiate stand and  translate weight anteriorly to upright posture Stand to Sit: 3: Mod assist;To chair/3-in-1;To toilet Stand to Sit Details: Mod assist 1 X with stand to sit from recliner with cues for technique.  With fatique patient requried Total +2 Ambulation/Gait Ambulation/Gait Assistance: 1: +2 Total assist;Patient percentage (comment) Ambulation/Gait Assistance Details (indicate cue type and reason): A for RW safe management as patient keeps the RW too far out in front of him and keep LLE outside of base of RW. Max cues for positioning and posture in RW. A for balance.  Ambulation Distance (Feet): 25 Feet Assistive device: Rolling walker Gait Pattern: Step-to pattern;Decreased stride length;Shuffle;Trunk flexed Gait velocity: decreased    Exercise    End of Session PT - End of Session Activity Tolerance: Patient limited by fatigue Patient left: in chair;with call bell in reach Nurse Communication: Mobility status for ambulation;Mobility status for transfers General Behavior During Session: [facility] for tasks performed Cognition: [facility] for tasks performed  Fredrich Birks 06/17/2011, 3:09 PM 06/17/2011 Fredrich Birks PTA [phone] pager [phone] office

## 2011-06-17 NOTE — Progress Notes (Signed)
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 He was acute hypercarbic and hypoxic respiratory failure on arrival. Assumed care from CCM on 1.6.12   Antibiotics:  avelox (CAP)12/29>>>1.5.13 Ceftriax (CAP)12/29>>>12.31 vanc (CAP) 12/29>>>1.1.13  Subjective: Well.  Family in room.  No specific c/o.  Really wants to drink water, but understands  Treatment Team:  W Ashley Royalty., MD Objective: Vital signs in last 24 hours: Temp:  [98.3 F (36.8 C)-98.4 F (36.9 C)] 98.4 F (36.9 C) (01/09 0500) Pulse Rate:  [89-99] 97  (01/09 0500) Resp:  [20] 20  (01/09 0500) BP: (130-145)/(87-89) 137/87 mmHg (01/09 0500) SpO2:  [91 %-98 %] 91 % (01/09 0500) FiO2 (%):  [4.5 %] 4.5 % (01/08 1418) Weight:  [128.2 kg (282 lb 10.1 oz)] 128.2 kg (282 lb 10.1 oz) (01/09 0500) Weight change: -0.4 kg (-14.1 oz)  Intake/Output Summary (Last 24 hours) at 06/17/11 0827 Last data filed at 06/17/11 0810  Gross per 24 hour  Intake   1150 ml  Output   2200 ml  Net  -1050 ml   BP 178/118  Pulse 105  Temp(Src) 98 F (36.7 C) (Oral)  Resp 22  Ht 5\' 6"  (1.676 m)  Wt 128.2 kg (282 lb 10.1 oz)  BMI 45.62 kg/m2  SpO2 92% General appearance: alert and cooperative Head: Normocephalic, without obvious abnormality, atraumatic Neck: no JVD, thyroid not enlarged, symmetric, no tenderness/mass/nodules and Neck veins not clearly visualized given his girth Lungs: clear to auscultation bilaterally Heart: irregularly irregular rhythm Abdomen: soft, non-tender; bowel sounds normal; no masses,  no organomegaly Pulses: 2+ and symmetric  Lab Results:  Basename 06/17/11 1631 06/17/11 0547 06/16/11 0625  NA 146* 147* --  K 6.1* 5.5* --  CL 108 110 --  CO2 32 31 --  GLUCOSE 97 99 --  BUN 63* 66* --  CREATININE 3.34* 3.33* --  CALCIUM 8.7 8.8 --  MG -- -- --  PHOS --  4.7* 4.5    Basename 06/17/11 0547 06/16/11 0625  AST -- --  ALT -- --  ALKPHOS -- --  BILITOT -- --  PROT -- --  ALBUMIN 2.8* 2.7*   No results found for this basename: LIPASE:2,AMYLASE:2 in the last 72 hours  Basename 06/17/11 0547  WBC 8.0  NEUTROABS --  HGB 12.5*  HCT 40.0  MCV 92.2  PLT 204   No results found for this basename: CKTOTAL:3,CKMB:3,CKMBINDEX:3,TROPONINI:3 in the last 72 hours No components found with this basename: POCBNP:3 No results found for this basename: DDIMER:2 in the last 72 hours No results found for this basename: HGBA1C:2 in the last 72 hours No results found for this basename: CHOL:2,HDL:2,LDLCALC:2,TRIG:2,CHOLHDL:2,LDLDIRECT:2 in the last 72 hours No results found for this basename: TSH,T4TOTAL,FREET3,T3FREE,THYROIDAB in the last 72 hours No results found for this basename: VITAMINB12:2,FOLATE:2,FERRITIN:2,TIBC:2,IRON:2,RETICCTPCT:2 in the last 72 hours  LV EF: 55% - 60%  ------------------------------------------------------------ History: PMH: Evaluate Left and Right Ventricular function. Congestive heart failure.  ------------------------------------------------------------ Study Conclusions  - Left ventricle: Poorly visualized. The cavity size was normal. Wall thickness was increased in a pattern of mild LVH. Systolic function was normal. The estimated ejection fraction was in the range of 55% to 60%. Images were inadequate for LV wall motion assessment. Indeterminant diastolic function. - Aortic valve: Poorly visualized. - Mitral valve: Poorly visualized. - Left atrium: Poorly visualized. - Right ventricle: The cavity  size was mildly dilated. Pacer wire or catheter noted in right ventricle. Systolic function was normal. - Right atrium: The atrium was mildly dilated. - Tricuspid valve: Poorly visualized. - Pulmonary arteries: No complete TR doppler jet was obtained so unable to estimate PA systolic pressure. - Systemic veins:  IVC measured 2.3 cm with minimal respirophasic variation, suggesting RA pressure 15 mmHg. Impressions:  - Technically difficult study with poor acoustic windows. Very hard to see anything. Definity contrast was used. I think that the LV systolic function is normal. The RV appears mildly dilated with probably normal systolic function. Dilated IVC suggests elevated RV filling pressure. Would need TEE to better visualize LV and valves   Micro Results:   Medications: I have reviewed the patient's current medications. Scheduled Meds:    . albuterol  2.5 mg Nebulization TID  . amiodarone  200 mg Oral Daily  . antiseptic oral rinse  15 mL Mouth Rinse QID  . aspirin  81 mg Oral Daily  . carvedilol  12.5 mg Oral BID WC  . chlorhexidine  15 mL Mouth/Throat BID  . ipratropium  0.5 mg Nebulization TID  . pantoprazole  40 mg Oral Q1200  . sodium polystyrene  30 g Oral Daily  . warfarin  5 mg Oral ONCE-1800  . warfarin  5 mg Oral ONCE-1800   Continuous Infusions:   . sodium chloride 20 mL/hr at 06/17/11 0600  . fentaNYL infusion INTRAVENOUS Stopped (06/10/11 0800)  . heparin 2,500 Units/hr (06/17/11 0519)  . DISCONTD: heparin 1,950 Units/hr (06/16/11 0830)   PRN Meds:.acetaminophen, albuterol, food thickener, insulin aspart  Assessment/Plan: 1.acute hypoxic/ hypercarbic Respiratory failure secondary to PNA :  Patient admitted to ICU , now extubated and transferred to tele  cont o2 via [place]-will attempt wean cont nebs-completed 8 days of avelox on 1/5  Swallow eval done on 1/5 and 1/8 , recommends level 2 dys diet with nectar thick   2. AKI  secondary to ATN from sepsis, on ARB and possible BOO  UOP 1.7 liters yesterday no need for HD as per renal . Have signed off for now.  3. Acute encephalopathy  secondary to hypercarbic resp failure, sepsis and uremia  improving  cont PT / OT   3. Afib with RVR  patient chronically on coumadin.  Was held due to coagulapathy , now resumed  and INR has been sub therapeutic.  Cardiology following and plan on cardioversion 06/17/10 at 13:00-continued IV heparin since 1/7  Cont amiodarone  Cont coreg bid. coreg dose increased further to to 12.5 mg bid , now rate better control  Coumadin dose per pharmacy   4. cardiomyopathy  appears euvolemic  Cont ASA and BB Holding ACEi due to AKI   5. Hyperkalemia  gien kayexalate, k of 6 today on repeat-Likely etiology is AKI vs Volume depletion form CHF If no better, work up and will re-consult Renal  Full code  Consults:  Cardiology Donnie Aho)  Renal ( col[place]onato--have signed off for now and needs outpt follow up)  Urology ( MacSherilyn Cooter) --saw for BOO on 12/29  Inpatient rehab consult ( called on 1/8)   Pending issues  cardioversion per cardiology lin am 1/10 inpatient rehab consult and evaluation-likely can be d/c once stabilized LOS: 10 days   Principal Problem:  *CAP (community acquired pneumonia)-treated completely for this.  Will wean O2 and re-assess the O2 requirements-might have an element of OSA? Active Problems:  Atrial Fibrillation-per Dr. Theodis Sato with coumadin bridge and Cadioversion per Dr. Donnie Aho.  Dilated cardiomyopathy-EF 55-60% 12.30  Chronic systolic heart failure  Sepsis  Acute renal failure  Long-term (current) use of anticoagulants  Hyperkalemia   LOS: 11 days   Orbin Mayeux,JAI 06/17/2011, 8:27 AM

## 2011-06-17 NOTE — Progress Notes (Signed)
ANTICOAGULATION CONSULT NOTE - Follow Up Consult  Pharmacy Consult for heparin Indication: atrial fibrillation  No Known Allergies  Patient Measurements: Height: 5\' 6"  (167.6 cm) Weight: 283 lb 8.2 oz (128.6 kg) IBW/kg (Calculated) : 63.8  Adjusted Body Weight: 95kg  Vital Signs: Temp: 98.3 F (36.8 C) (01/08 2045) Temp src: Oral (01/08 2045) BP: 130/87 mmHg (01/08 2045) Pulse Rate: 99  (01/08 2045)  Labs:  Cody Faulkner 06/16/11 2323 06/16/11 1621 06/16/11 0625 06/15/11 0615 06/14/11 0705  HGB -- -- -- -- --  HCT -- -- -- -- --  PLT -- -- -- -- --  APTT -- -- -- -- --  LABPROT -- -- 18.8* 19.7* 21.3*  INR -- -- 1.54* 1.64* 1.81*  HEPARINUNFRC 0.26* 0.19* 0.10* -- --  CREATININE -- -- 3.89* 4.23* 4.58*  CKTOTAL -- -- -- -- --  CKMB -- -- -- -- --  TROPONINI -- -- -- -- --   Estimated Creatinine Clearance: 23.4 ml/min (by C-G formula based on Cr of 3.89).   Medications:  Scheduled:    . albuterol  2.5 mg Nebulization TID  . amiodarone  200 mg Oral Daily  . antiseptic oral rinse  15 mL Mouth Rinse QID  . aspirin  81 mg Oral Daily  . carvedilol  12.5 mg Oral BID WC  . chlorhexidine  15 mL Mouth/Throat BID  . ipratropium  0.5 mg Nebulization TID  . pantoprazole  40 mg Oral Q1200  . warfarin  5 mg Oral ONCE-1800   Infusions:    . sodium chloride 20 mL/hr at 06/15/11 2345  . fentaNYL infusion INTRAVENOUS Stopped (06/10/11 0800)  . heparin 2,250 Units/hr (06/16/11 1831)  . DISCONTD: heparin 1,950 Units/hr (06/16/11 0830)    Assessment: 66yo male remains slightly subtherapeutic on heparin for Afib while INR <2.  Goal of Therapy:  Heparin level 0.3-0.7 units/ml   Plan:  Will increase heparin to 2500 units/hr and check level in 8hr.  Colleen Can PharmD BCPS 06/17/2011,2:11 AM (completed at 0040 during downtime)

## 2011-06-17 NOTE — Progress Notes (Signed)
Subjective:  Improving.  Not SOB and encephalopathy is better.  On IV heparin now. PT  Sub therapeutic. Still hoarse and coughing. Objective:  Vital Signs in the last 24 hours: BP 137/87  Pulse 97  Temp(Src) 98.4 F (36.9 C) (Oral)  Resp 20  Ht 5\' 6"  (1.676 m)  Wt 128.2 kg (282 lb 10.1 oz)  BMI 45.62 kg/m2  SpO2 94% Weight increased by 4 kg ?accurate?  Physical Exam: Obese WM hoarse, in NAD Lungs:  Coarse rhonchi Cardiac:  irregular rhythm, normal S1 and S2, no S3 Abdomen:  Soft, nontender, no masses Extremities:  1+ edema, venous insufficiency changes  Intake/Output from previous day: 01/08 0701 - 01/09 0700 In: 1150 [P.O.:480; I.V.:670] Out: 1700 [Urine:1700]  Lab Results: Basic Metabolic Panel:  Basename 06/17/11 0547 06/16/11 0625  NA 147* 147*  K 5.5* 5.1  CL 110 111  CO2 31 29  GLUCOSE 99 96  BUN 66* 78*  CREATININE 3.33* 3.89*   PROTIME: Lab Results  Component Value Date   INR 1.81* 06/17/2011   INR 1.54* 06/16/2011   INR 1.64* 06/15/2011    Telemetry: A fib rate reasonable control  Assessment/Plan:  1. Acute respiratory failure due to sepsis and pneumonia improving but still needs help  2.. Atrial fibrillation better rate contol  3. Acute renal failure improving  4. Warfarin anticoagulation now suboptimal -  Continue heparin until therapeutic  Rec:  Plan elective cardioversion tomorrow.  Procedure discussed fully with patient including risk and he is agreeable.    Darden Palmer.  MD [facility] 06/17/2011, 1:44 PM

## 2011-06-17 NOTE — Progress Notes (Signed)
Patient faxed out to are SNF's- rec'd call back from SNF in [place] stating the insurance info is incorrect- his [facility] is no longer active. Phone call in to the family to ascertain the current insurance info. Will continue to work towards SNF and fax additional info to SNF's. Reece Levy, MSW, Theresia Majors [phone]

## 2011-06-17 NOTE — Progress Notes (Signed)
Speech Language/Pathology Speech Pathology: Dysphagia Treatment Note  Patient was observed with : thin and nectar thick liquids  Patient was noted to have s/s of aspiration : Yes:  Hard cough with wet voice  Lung Sounds:  Rhonchi  Temperature: afebrile  Patient required: Max verbal cues to follow small sip strategy.  Overt aspiration observed with thin liquids. SpO2 monitor in place during session. Even with hard coughing pts O2 sats did not fluctuate. Pt with no overt s/s of aspiration with nectar thick liquids.  Vocal quality severely hoarse.    Clinical Impression: Pt tolerating nectar thick liquids, still requiring cues for small sips.  Pt is not ready for downgrade or repeat objective test at this time as he continues to show signs of aspiration with thin liquids.   Recommendations:  Continue current diet.   Pain:   none Intervention Required:   No   Goals: Goals Partially Met

## 2011-06-18 ENCOUNTER — Other Ambulatory Visit: Payer: Self-pay

## 2011-06-18 ENCOUNTER — Encounter (HOSPITAL_COMMUNITY): Payer: Self-pay | Admitting: Anesthesiology

## 2011-06-18 ENCOUNTER — Inpatient Hospital Stay (HOSPITAL_COMMUNITY): Payer: Medicare Other | Admitting: Anesthesiology

## 2011-06-18 ENCOUNTER — Inpatient Hospital Stay (HOSPITAL_COMMUNITY): Payer: Medicare Other

## 2011-06-18 ENCOUNTER — Encounter (HOSPITAL_COMMUNITY)
Admission: EM | Disposition: A | Discharge status: Skilled Nursing Facility | Payer: Self-pay | Source: Home / Self Care | Attending: Pulmonary Disease

## 2011-06-18 HISTORY — PX: CARDIOVERSION: SHX1299

## 2011-06-18 LAB — PROTIME-INR: Prothrombin Time: 25.3 seconds — ABNORMAL HIGH (ref 11.6–15.2)

## 2011-06-18 LAB — RENAL FUNCTION PANEL
BUN: 54 mg/dL — ABNORMAL HIGH (ref 6–23)
CO2: 31 mEq/L (ref 19–32)
Calcium: 9 mg/dL (ref 8.4–10.5)
GFR calc Af Amer: 23 mL/min — ABNORMAL LOW (ref >90)
Glucose, Bld: 91 mg/dL (ref 70–99)
Sodium: 145 mEq/L (ref 135–145)

## 2011-06-18 LAB — CBC
Hemoglobin: 12.4 g/dL — ABNORMAL LOW (ref 13.0–17.0)
MCH: 28.2 pg (ref 26.0–34.0)
Platelets: 195 10*3/uL (ref 150–400)
RBC: 4.4 MIL/uL (ref 4.22–5.81)

## 2011-06-18 LAB — HEPARIN LEVEL (UNFRACTIONATED)
Heparin Unfractionated: 0.26 IU/mL — ABNORMAL LOW (ref 0.30–0.70)
Heparin Unfractionated: 0.34 IU/mL (ref 0.30–0.70)

## 2011-06-18 SURGERY — CARDIOVERSION
Anesthesia: General | Wound class: Clean

## 2011-06-18 MED ORDER — PROPOFOL 10 MG/ML IV BOLUS
INTRAVENOUS | Status: DC | PRN
  Administered 2011-06-18: 40 mg via INTRAVENOUS

## 2011-06-18 MED ORDER — LABETALOL HCL 5 MG/ML IV SOLN
10.0000 mg | Freq: Once | INTRAVENOUS | Status: AC
  Administered 2011-06-18: 10 mg via INTRAVENOUS
  Filled 2011-06-18 (×2): qty 4

## 2011-06-18 MED ORDER — HYDROCORTISONE 1 % EX CREA
1.0000 "application " | TOPICAL_CREAM | Freq: Three times a day (TID) | CUTANEOUS | Status: DC | PRN
  Filled 2011-06-18: qty 28

## 2011-06-18 MED ORDER — SODIUM CHLORIDE 0.9 % IV SOLN
INTRAVENOUS | Status: DC | PRN
  Administered 2011-06-18: 13:00:00 via INTRAVENOUS

## 2011-06-18 MED ORDER — LIDOCAINE HCL (CARDIAC) 20 MG/ML IV SOLN
INTRAVENOUS | Status: DC | PRN
  Administered 2011-06-18: 20 mg via INTRAVENOUS

## 2011-06-18 MED ORDER — SODIUM CHLORIDE 0.9 % IJ SOLN
10.0000 mL | INTRAMUSCULAR | Status: DC | PRN
  Administered 2011-06-19: 10 mL

## 2011-06-18 MED ORDER — WARFARIN SODIUM 5 MG PO TABS
5.0000 mg | ORAL_TABLET | Freq: Once | ORAL | Status: AC
  Administered 2011-06-18: 5 mg via ORAL
  Filled 2011-06-18: qty 1

## 2011-06-18 NOTE — Anesthesia Preprocedure Evaluation (Addendum)
Anesthesia Evaluation  Patient identified by MRN, date of birth, ID band Patient awake    Reviewed: Allergy & Precautions, H&P , NPO status , Patient's Chart, lab work & pertinent test results, reviewed documented beta blocker date and time   History of Anesthesia Complications (+) DIFFICULT AIRWAY  Airway Mallampati: II TM Distance: >3 FB Neck ROM: Full    Dental  (+) Edentulous Upper, Partial Lower and Dental Advisory Given   Pulmonary shortness of breath, asthma , sleep apnea , pneumonia , COPD COPD inhaler,          Cardiovascular Exercise Tolerance: Poor hypertension, Pt. on medications and Pt. on home beta blockers + CAD and +CHF + dysrhythmias Atrial Fibrillation Irregular  EF 55-60% per ECHO on 2020/07/1410   Neuro/Psych PSYCHIATRIC DISORDERS Depression    GI/Hepatic   Endo/Other  Hypothyroidism   Renal/GU      Musculoskeletal   Abdominal   Peds  Hematology   Anesthesia Other Findings   Reproductive/Obstetrics                         Anesthesia Physical Anesthesia Plan  ASA: III  Anesthesia Plan: General   Post-op Pain Management:    Induction: Intravenous  Airway Management Planned: Mask and Natural Airway  Additional Equipment:   Intra-op Plan:   Post-operative Plan:   Informed Consent: I have reviewed the patients History and Physical, chart, labs and discussed the procedure including the risks, benefits and alternatives for the proposed anesthesia with the patient or authorized representative who has indicated his/her understanding and acceptance.   Dental advisory given  Plan Discussed with: CRNA, Anesthesiologist and Surgeon  Anesthesia Plan Comments:        Anesthesia Quick Evaluation

## 2011-06-18 NOTE — Progress Notes (Signed)
   Patient seen and examined.  No interval change in history and exam since last note 06/17/11.  Stable for procedure.  Cody Faulkner. MD [facility]  06/18/2011

## 2011-06-18 NOTE — Op Note (Signed)
Electrical Cardioversion Procedure Note Dariusz Brase [id number] Oct 11, 1945  Procedure: Electrical Cardioversion Indications:  Atrial Fibrillation  Time Out: Verified patient identification, verified procedure,medications/allergies/relevent history reviewed, required imaging and test results available.    Procedure Details  The patient was NPO after midnight. Anesthesia was administered at the beside  by Dr.Greg Katrinka Blazing with 40 mg of propofol.  Cardioversion was done with synchronized biphasic defibrillation with AP pads with 120 watts.  The patient converted to normal sinus rhythm.  The [employer] representative was present to monitor the defibrillator and verified NSR. The patient tolerated the procedure well    IMPRESSION:  Successful cardioversion of atrial fibrillation    W. Viann Fish, Montez Hageman. MD [facility] 06/18/2011, 1:13 PM

## 2011-06-18 NOTE — Transfer of Care (Signed)
Immediate Anesthesia Transfer of Care Note  Patient: Cody Faulkner  Procedure(s) Performed:  CARDIOVERSION  Patient Location: Nursing Unit  Anesthesia Type: General  Level of Consciousness: awake, alert  and oriented  Airway & Oxygen Therapy: Patient Spontanous Breathing and Patient connected to nasal cannula oxygen  Post-op Assessment: Report given to PACU RN, Post -op Vital signs reviewed and stable and Patient moving all extremities  Post vital signs: Reviewed and stable Filed Vitals:   06/18/11 0645  BP: 141/95  Pulse: 100  Temp:   Resp:     Complications: No apparent anesthesia complications

## 2011-06-18 NOTE — Progress Notes (Signed)
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 He was acute hypercarbic and hypoxic respiratory failure on arrival. Assumed care from CCM on 1.6.12   Antibiotics:  avelox (CAP)12/29>>>1.5.13 Ceftriax (CAP)12/29>>>12.31 vanc (CAP) 12/29>>>1.1.13  Subjective: Well.  Family in room.  No specific c/o.  Really wants to drink water, but understands  Treatment Team:  W Ashley Royalty., MD Objective: Vital signs in last 24 hours: Temp:  [98 F (36.7 C)-98.6 F (37 C)] 98.6 F (37 C) (01/10 0432) Pulse Rate:  [55-107] 61  (01/10 1500) Resp:  [18-28] 18  (01/10 1436) BP: (121-178)/(80-118) 141/84 mmHg (01/10 1500) SpO2:  [92 %-95 %] 95 % (01/10 1500) Weight:  [125.2 kg (276 lb 0.3 oz)] 125.2 kg (276 lb 0.3 oz) (01/10 0500) Weight change: -3 kg (-6 lb 9.8 oz)  Intake/Output Summary (Last 24 hours) at 06/18/11 1630 Last data filed at 06/18/11 1312  Gross per 24 hour  Intake     50 ml  Output   3275 ml  Net  -3225 ml   BP 141/84  Pulse 61  Temp(Src) 98.6 F (37 C) (Oral)  Resp 18  Ht 5\' 6"  (1.676 m)  Wt 125.2 kg (276 lb 0.3 oz)  BMI 44.55 kg/m2  SpO2 95% General appearance: alert and cooperative Head: Normocephalic, without obvious abnormality, atraumatic Neck: no JVD, thyroid not enlarged, symmetric, no tenderness/mass/nodules and Neck veins not clearly visualized given his girth Lungs: clear to auscultation bilaterally Heart: irregularly irregular rhythm Abdomen: soft, non-tender; bowel sounds normal; no masses,  no organomegaly Pulses: 2+ and symmetric  Lab Results:  Basename 06/18/11 0555 06/17/11 2245 06/17/11 0547  NA 145 144 --  K 5.0 5.4* --  CL 108 108 --  CO2 31 31 --  GLUCOSE 91 112* --  BUN 54* 58* --    CREATININE 3.05* 3.14* --  CALCIUM 9.0 9.0 --  MG -- -- --  PHOS 4.3 -- 4.7*    Basename 06/18/11 0555 06/17/11 0547  AST -- --  ALT -- --  ALKPHOS -- --  BILITOT -- --  PROT -- --  ALBUMIN 2.8* 2.8*   No results found for this basename: LIPASE:2,AMYLASE:2 in the last 72 hours  Basename 06/18/11 0555 06/17/11 0547  WBC 7.2 8.0  NEUTROABS -- --  HGB 12.4* 12.5*  HCT 40.6 40.0  MCV 92.3 92.2  PLT 195 204   No results found for this basename: CKTOTAL:3,CKMB:3,CKMBINDEX:3,TROPONINI:3 in the last 72 hours No components found with this basename: POCBNP:3 No results found for this basename: DDIMER:2 in the last 72 hours No results found for this basename: HGBA1C:2 in the last 72 hours No results found for this basename: CHOL:2,HDL:2,LDLCALC:2,TRIG:2,CHOLHDL:2,LDLDIRECT:2 in the last 72 hours No results found for this basename: TSH,T4TOTAL,FREET3,T3FREE,THYROIDAB in the last 72 hours No results found for this basename: VITAMINB12:2,FOLATE:2,FERRITIN:2,TIBC:2,IRON:2,RETICCTPCT:2 in the last 72 hours  LV EF: 55% - 60%  ------------------------------------------------------------ History: PMH: Evaluate Left and Right Ventricular function. Congestive heart failure.  ------------------------------------------------------------ Study Conclusions  - Left ventricle: Poorly visualized. The cavity size was normal. Wall thickness was increased in a pattern of mild LVH. Systolic function was normal. The estimated ejection fraction was in the range of 55% to 60%. Images were inadequate for LV wall motion assessment. Indeterminant diastolic  function. - Aortic valve: Poorly visualized. - Mitral valve: Poorly visualized. - Left atrium: Poorly visualized. - Right ventricle: The cavity size was mildly dilated. Pacer wire or catheter noted in right ventricle. Systolic function was normal. - Right atrium: The atrium was mildly dilated. - Tricuspid valve: Poorly visualized. - Pulmonary  arteries: No complete TR doppler jet was obtained so unable to estimate PA systolic pressure. - Systemic veins: IVC measured 2.3 cm with minimal respirophasic variation, suggesting RA pressure 15 mmHg. Impressions:  - Technically difficult study with poor acoustic windows. Very hard to see anything. Definity contrast was used. I think that the LV systolic function is normal. The RV appears mildly dilated with probably normal systolic function. Dilated IVC suggests elevated RV filling pressure. Would need TEE to better visualize LV and valves   Micro Results:   Medications: I have reviewed the patient's current medications. Scheduled Meds:    . albuterol  2.5 mg Nebulization TID  . amiodarone  200 mg Oral Daily  . antiseptic oral rinse  15 mL Mouth Rinse QID  . aspirin  81 mg Oral Daily  . calcium gluconate  1 g Intravenous Once  . carvedilol  12.5 mg Oral BID WC  . chlorhexidine  15 mL Mouth/Throat BID  . ipratropium  0.5 mg Nebulization TID  . labetalol  10 mg Intravenous Once  . pantoprazole  40 mg Oral Q1200  . sodium polystyrene  30 g Oral Daily  . warfarin  5 mg Oral ONCE-1800  . warfarin  5 mg Oral ONCE-1800   Continuous Infusions:    . heparin 2,900 Units/hr (06/18/11 1545)  . DISCONTD: sodium chloride 20 mL/hr at 06/18/11 1447  . DISCONTD: sodium chloride 20 mL/hr at 06/17/11 0600  . DISCONTD: fentaNYL infusion INTRAVENOUS Stopped (06/10/11 0800)   PRN Meds:.acetaminophen, albuterol, food thickener, hydrocortisone cream, hydrocortisone cream, insulin aspart, sodium chloride  Assessment/Plan:  1.acute hypoxic/ hypercarbic Respiratory failure secondary to PNA :  Patient admitted to ICU , now extubated and transferred to tele  cont o2 via [place]-will attempt wean cont nebs-completed 8 days of avelox on 1/5  Swallow eval done on 1/5 and 1/8 , recommends level 2 dys diet with nectar thick   2. AKI  secondary to ATN from sepsis, on ARB and possible Bladder outlet  obstruction  UOP 1.4 liters yesterday no need for HD as per renal . Have signed off for now. Creatinine seems to be improving slowly  3. Acute encephalopathy  secondary to hypercarbic resp failure, sepsis and uremia  improving  cont PT / OT   3. Afib with RVR  patient chronically on coumadin.  Was held due to coagulapathy , now resumed Cardioverted 06/17/10  Cont amiodarone  Cont coreg bid. coreg dose increased further to to 12.5 mg bid , now rate better control  Coumadin dose per pharmacy-INR 2.26 Await decision per Cardiologist re: discharge disposition  4. cardiomyopathy -EF 55-60% 12.30 appears euvolemic  Cont ASA and BB Holding ACE due to AKI   5. Hyperkalemia  gien kayexalate, k of 6 today on repeat-Likely etiology is AKI vs Volume depletion form CHF If no better, work up and will re-consult Renal Rpt labs am  Full code  Consults:  Cardiology Donnie Aho)  Renal ( coladonato--have signed off for now and needs outpt follow up)  Urology Gildardo GriffesSherilyn Cooter) --saw for BOO on 12/29  Inpatient rehab consult ( called on 1/8)   Pending issues  cardioversion per cardiology lin am 1/10 inpatient rehab  consult and evaluation-likely can be d/c once stabilized LOS: 10 days     LOS: 12 days   Mykala Mccready,JAI 06/18/2011, 4:30 PM

## 2011-06-18 NOTE — Progress Notes (Signed)
ANTICOAGULATION CONSULT NOTE - Follow Up Consult  Pharmacy Consult for heparin Indication: atrial fibrillation  No Known Allergies  Patient Measurements: Height: 5\' 6"  (167.6 cm) Weight: 276 lb 0.3 oz (125.2 kg) IBW/kg (Calculated) : 63.8   Vital Signs: Temp: 98.6 F (37 C) (01/10 0432) Temp src: Oral (01/10 0432) BP: 141/95 mmHg (01/10 0645) Pulse Rate: 100  (01/10 0645)  Labs:  Basename 06/18/11 0555 06/17/11 2245 06/17/11 1631 06/17/11 0910 06/17/11 0547 06/16/11 0625  HGB 12.4* -- -- -- 12.5* --  HCT 40.6 -- -- -- 40.0 --  PLT 195 -- -- -- 204 --  APTT -- -- -- -- -- --  LABPROT 25.3* -- -- -- 21.3* 18.8*  INR 2.26* -- -- -- 1.81* 1.54*  HEPARINUNFRC 0.26* -- -- 0.14* 0.32 --  CREATININE 3.05* 3.14* 3.34* -- -- --  CKTOTAL -- -- -- -- -- --  CKMB -- -- -- -- -- --  TROPONINI -- -- -- -- -- --   Estimated Creatinine Clearance: 29.4 ml/min (by C-G formula based on Cr of 3.05).   Medications:  Scheduled:    . albuterol  2.5 mg Nebulization TID  . amiodarone  200 mg Oral Daily  . antiseptic oral rinse  15 mL Mouth Rinse QID  . aspirin  81 mg Oral Daily  . calcium gluconate  1 g Intravenous Once  . carvedilol  12.5 mg Oral BID WC  . chlorhexidine  15 mL Mouth/Throat BID  . ipratropium  0.5 mg Nebulization TID  . labetalol  10 mg Intravenous Once  . pantoprazole  40 mg Oral Q1200  . sodium polystyrene  30 g Oral Daily  . warfarin  5 mg Oral ONCE-1800   Infusions:    . sodium chloride    . heparin 2,900 Units/hr (06/18/11 0551)  . DISCONTD: sodium chloride 20 mL/hr at 06/17/11 0600  . DISCONTD: fentaNYL infusion INTRAVENOUS Stopped (06/10/11 0800)    Assessment: 65yo male slightly subtherapeutic on heparin after resumed, INR now >2.  Goal of Therapy:  Heparin level 0.3-0.7 units/ml   Plan:  Will continue heparin for now (may continue to accumulate given renal fxn) and f/u with anticoag plans (?need to continue heparin w/ INR>2?) and check additional  level.  Colleen Can PharmD BCPS 06/18/2011,8:07 AM

## 2011-06-18 NOTE — Progress Notes (Signed)
V\  CARE MANAGEMENT NOTE 06/18/2011  Patient:  RYDAN, GULYAS   Account Number:  [id number]  Date Initiated:  06/08/2011  Documentation initiated by:  [facility]  Subjective/Objective Assessment:   collapsed at home - resp failure - intubated.  Ind prior to admission.     Action/Plan:   PT/OT evals, CIR consult   Anticipated DC Date:  06/15/2011   Anticipated DC Plan:  SKILLED NURSING FACILITY  In-house referral  Clinical Social Worker      DC Planning Services  CM consult      Choice offered to / List presented to:             Status of service:  In process, will continue to follow Medicare Important Message given?   (If response is "NO", the following Medicare IM given date fields will be blank) Date Medicare IM given:   Date Additional Medicare IM given:    Discharge Disposition:    Per UR Regulation:  Reviewed for med. necessity/level of care/duration of stay  Comments:  06/18/11- 1130- Donn Pierini RN, BSN [phone] CIR consulted completed, CIR will not be offering a bed, pt will need SNF placement. CSW following for SNF placement. Pt for cardioversion today per cards. Plan for SNF tomorrow if medically cleared for discharge.  06-11-11 9am Avie Arenas, RNBSN [phone] UR Completed.  06-08-11 11am Avie Arenas, RNBSN [phone] UR completed.

## 2011-06-18 NOTE — Progress Notes (Signed)
ANTICOAGULATION CONSULT NOTE - Follow Up Consult  Pharmacy Consult for heparin Indication: atrial fibrillation  No Known Allergies  Patient Measurements: Height: 5\' 6"  (167.6 cm) Weight: 276 lb 0.3 oz (125.2 kg) IBW/kg (Calculated) : 63.8   Vital Signs: Temp: 98.6 F (37 C) (01/10 0432) Temp src: Oral (01/10 0432) BP: 141/84 mmHg (01/10 1500) Pulse Rate: 61  (01/10 1500)  Labs:  Basename 06/18/11 1410 06/18/11 0555 06/17/11 2245 06/17/11 1631 06/17/11 0910 06/17/11 0547 06/16/11 0625  HGB -- 12.4* -- -- -- 12.5* --  HCT -- 40.6 -- -- -- 40.0 --  PLT -- 195 -- -- -- 204 --  APTT -- -- -- -- -- -- --  LABPROT -- 25.3* -- -- -- 21.3* 18.8*  INR -- 2.26* -- -- -- 1.81* 1.54*  HEPARINUNFRC 0.34 0.26* -- -- 0.14* -- --  CREATININE -- 3.05* 3.14* 3.34* -- -- --  CKTOTAL -- -- -- -- -- -- --  CKMB -- -- -- -- -- -- --  TROPONINI -- -- -- -- -- -- --   Estimated Creatinine Clearance: 29.4 ml/min (by C-G formula based on Cr of 3.05).   Medications:  Scheduled:     . albuterol  2.5 mg Nebulization TID  . amiodarone  200 mg Oral Daily  . antiseptic oral rinse  15 mL Mouth Rinse QID  . aspirin  81 mg Oral Daily  . calcium gluconate  1 g Intravenous Once  . carvedilol  12.5 mg Oral BID WC  . chlorhexidine  15 mL Mouth/Throat BID  . ipratropium  0.5 mg Nebulization TID  . labetalol  10 mg Intravenous Once  . pantoprazole  40 mg Oral Q1200  . sodium polystyrene  30 g Oral Daily  . warfarin  5 mg Oral ONCE-1800  . warfarin  5 mg Oral ONCE-1800   Infusions:     . heparin 2,900 Units/hr (06/18/11 0551)  . DISCONTD: sodium chloride 20 mL/hr at 06/18/11 1447  . DISCONTD: sodium chloride 20 mL/hr at 06/17/11 0600  . DISCONTD: fentaNYL infusion INTRAVENOUS Stopped (06/10/11 0800)    Assessment: 65yo male therapeutic on heparin, INR now >2.  Goal of Therapy:  Heparin level 0.3-0.7 units/ml   Plan:  Heparin level therapeutic.  Cont for 24 hours overlap with therapeutic  INR  Mehran Guderian, Haskel Schroeder PharmD  06/18/2011,3:45 PM

## 2011-06-18 NOTE — Progress Notes (Signed)
Subjective:  Improving.  Not SOB and encephalopathy is better.  On IV heparin now. PT now therapeutic, but will need to have overlap with heparin for 24 hours to insure stability. Still hoarse and coughing.  Objective:  Vital Signs in the last 24 hours: BP 141/95  Pulse 100  Temp(Src) 98.6 F (37 C) (Oral)  Resp 28  Ht 5\' 6"  (1.676 m)  Wt 125.2 kg (276 lb 0.3 oz)  BMI 44.55 kg/m2  SpO2 94%   Physical Exam: Obese WM hoarse, in NAD Lungs:  Coarse rhonchi Cardiac:  irregular rhythm, normal S1 and S2, no S3 Abdomen:  Soft, nontender, no masses Extremities:  1+ edema, venous insufficiency changes  Intake/Output from previous day: 01/09 0701 - 01/10 0700 In: 600 [P.O.:600] Out: 1975 [Urine:1975]  Lab Results: Basic Metabolic Panel:  Basename 06/18/11 0555 06/17/11 2245  NA 145 144  K 5.0 5.4*  CL 108 108  CO2 31 31  GLUCOSE 91 112*  BUN 54* 58*  CREATININE 3.05* 3.14*   PROTIME: Lab Results  Component Value Date   INR 2.26* 06/18/2011   INR 1.81* 06/17/2011   INR 1.54* 06/16/2011    Telemetry: A fib rate reasonable control  Assessment/Plan:  1. Acute respiratory failure due to sepsis and pneumonia improving but still needs help  2.. Atrial fibrillation better rate contol  3. Acute renal failure improving  4. Warfarin anticoagulation now therapeutic  Continue heparin another 24 hours. Rec:  Plan elective cardioversion today.  Darden Palmer.  MD [facility] 06/18/2011, 8:46 AM

## 2011-06-18 NOTE — Progress Notes (Signed)
SNF bed selected at  [facility]- patient's cousin will assist with getting patient signed in. I have spoken with [employer] and will get SNF/EMS auth on day of d/c.  Patient for cardioversion tomorrow. Reece Levy, MSW, Theresia Majors [phone]

## 2011-06-18 NOTE — Preoperative (Signed)
Beta Blockers   Reason not to administer Beta Blockers:Not Applicable 

## 2011-06-18 NOTE — Progress Notes (Signed)
Pt's BP 177/106. Notified MD and 10mg  of IV labetalol ordered. Medication given and BP recheck: 141/95. Will continue to monitor.

## 2011-06-18 NOTE — Anesthesia Postprocedure Evaluation (Signed)
  Anesthesia Post-op Note  Patient: Cody Faulkner  Procedure(s) Performed:  CARDIOVERSION  Patient Location: Nursing Unit  Anesthesia Type: General  Level of Consciousness: awake, alert  and oriented  Airway and Oxygen Therapy: Patient Spontanous Breathing and Patient connected to nasal cannula oxygen  Post-op Pain: none  Post-op Assessment: Post-op Vital signs reviewed, Patient's Cardiovascular Status Stable, Respiratory Function Stable, Patent Airway, No signs of Nausea or vomiting and Adequate PO intake  Post-op Vital Signs: Reviewed and stable  Complications: No apparent anesthesia complications

## 2011-06-18 NOTE — Progress Notes (Signed)
ANTICOAGULATION CONSULT NOTE - Follow Up Consult  Pharmacy Consult for Coumadin Indication: atrial fibrillation  No Known Allergies  Patient Measurements: Height: 5\' 6"  (167.6 cm) Weight: 276 lb 0.3 oz (125.2 kg) IBW/kg (Calculated) : 63.8   Vital Signs: Temp: 98.6 F (37 C) (01/10 0432) Temp src: Oral (01/10 0432) BP: 141/95 mmHg (01/10 0645) Pulse Rate: 100  (01/10 0645)  Labs:  Basename 06/18/11 0555 06/17/11 2245 06/17/11 1631 06/17/11 0910 06/17/11 0547 06/16/11 0625  HGB 12.4* -- -- -- 12.5* --  HCT 40.6 -- -- -- 40.0 --  PLT 195 -- -- -- 204 --  APTT -- -- -- -- -- --  LABPROT 25.3* -- -- -- 21.3* 18.8*  INR 2.26* -- -- -- 1.81* 1.54*  HEPARINUNFRC 0.26* -- -- 0.14* 0.32 --  CREATININE 3.05* 3.14* 3.34* -- -- --  CKTOTAL -- -- -- -- -- --  CKMB -- -- -- -- -- --  TROPONINI -- -- -- -- -- --   Estimated Creatinine Clearance: 29.4 ml/min (by C-G formula based on Cr of 3.05).   Medications:  Scheduled:     . albuterol  2.5 mg Nebulization TID  . amiodarone  200 mg Oral Daily  . antiseptic oral rinse  15 mL Mouth Rinse QID  . aspirin  81 mg Oral Daily  . calcium gluconate  1 g Intravenous Once  . carvedilol  12.5 mg Oral BID WC  . chlorhexidine  15 mL Mouth/Throat BID  . ipratropium  0.5 mg Nebulization TID  . labetalol  10 mg Intravenous Once  . pantoprazole  40 mg Oral Q1200  . sodium polystyrene  30 g Oral Daily  . warfarin  5 mg Oral ONCE-1800    Assessment: 66 y/o male receiving coumadin/heparin for h/o afib. INR 2.26.   Noted drug interaction with amiodarone.  Coumadin 5 mg daily was home dose. Goal of Therapy:  INR 2-3   Plan:  Coumadin 5 mg again today.  Heparin was adjusted this am and level pending for 14:00  Murvin Donning Pager [phone] 06/18/2011,12:51 PM

## 2011-06-18 NOTE — Progress Notes (Signed)
Utilization review complete 

## 2011-06-19 ENCOUNTER — Encounter (HOSPITAL_COMMUNITY): Payer: Self-pay | Admitting: Cardiology

## 2011-06-19 LAB — BASIC METABOLIC PANEL
CO2: 32 mEq/L (ref 19–32)
Calcium: 9 mg/dL (ref 8.4–10.5)
Creatinine, Ser: 3.14 mg/dL — ABNORMAL HIGH (ref 0.50–1.35)
Glucose, Bld: 97 mg/dL (ref 70–99)

## 2011-06-19 LAB — CBC
HCT: 38.4 % — ABNORMAL LOW (ref 39.0–52.0)
Hemoglobin: 11.6 g/dL — ABNORMAL LOW (ref 13.0–17.0)
MCH: 28 pg (ref 26.0–34.0)
MCHC: 30.2 g/dL (ref 30.0–36.0)

## 2011-06-19 LAB — PROTIME-INR: INR: 2.87 — ABNORMAL HIGH (ref 0.00–1.49)

## 2011-06-19 MED ORDER — HYDRALAZINE HCL 25 MG PO TABS: 25.0000 mg | ORAL_TABLET | Freq: Two times a day (BID) | ORAL | Status: DC

## 2011-06-19 MED ORDER — CARVEDILOL 25 MG PO TABS: 12.5000 mg | ORAL_TABLET | Freq: Two times a day (BID) | ORAL | Status: DC

## 2011-06-19 MED ORDER — WARFARIN SODIUM 2.5 MG PO TABS
2.5000 mg | ORAL_TABLET | Freq: Once | ORAL | Status: DC
  Filled 2011-06-19: qty 1

## 2011-06-19 MED ORDER — HYDRALAZINE HCL 25 MG PO TABS
25.0000 mg | ORAL_TABLET | Freq: Two times a day (BID) | ORAL | Status: DC
  Administered 2011-06-19: 25 mg via ORAL
  Filled 2011-06-19 (×3): qty 1

## 2011-06-19 MED ORDER — WARFARIN SODIUM 5 MG PO TABS: 2.5000 mg | ORAL_TABLET | Freq: Every day | ORAL | Status: DC

## 2011-06-19 NOTE — Discharge Summary (Signed)
Physician Discharge Summary  Patient ID: Cody Faulkner MRN: [id number] DOB/AGE: Oct 13, 1945 66 y.o.  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 He was acute hypercarbic and hypoxic respiratory failure on arrival requiring intubation. Patient pan cultured and started on IV antibiotics for sepsis due to CAP. Required foley placement by Dr. Brunilda Payor. 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 Nephrology consulted for input and felt patient with multifactorial oliguric renal failure and recommending monitoring UOP. Ace held and treated with diuretics with improvement in renal status.  Extubated 01/02. Dr. Donnie Aho consulted for input on rapid atrial fibrillation and recommended BB for rate control. MBS 01/05 with penetration and aspiration of nectar with large boluses. Patient started on D2 diet and nectar thick liquids with full supervision. Patient with significantly impaired phonation with question of laryngeal dysfunction. Issues with confusion resolving. Therapies initiated on 01/04 and patient noted to be deconditioned.    1.acute hypoxic/ hypercarbic Respiratory failure secondary to PNA :  Patient admitted to ICU , now extubated and transferred to tele  cont o2 via -will attempt weancont nebs-completed 8 days of avelox on 1/5.  Swallow eval done on 1/5 and  1/8 , recommends level 2 dys diet with nectar thick.  Desatted in hospital to 80's while at rest and will need to continue oxygen for the short trerm in the SNF with re-evaluation of the need by Nursing facility  2. AKI   secondary to ATN from sepsis, on ARB and possible Bladder outlet obstruction-Urology was consulted while in hospital and did not think this was relate dto any bladder obstruction-Foley was placed -UOP has been good-Renal consulted and felt he would recover from the same.  Held any nephrotoxins.  Creatinine imporved moderately as in-patient-will need a bmet in 3-5 days Was hyperkalemic as well in hospital, likely 2/2 to AKI-held K and this resolved  3. Acute encephalopathy  secondary to hypercarbic resp failure, sepsis and uremia   improving   cont PT / OT   3. Afib with RVR  patient chronically on coumadin.  Was held due to coagulapathy , now resumed Cardioverted 06/17/10-maintianed NSR-was on Heparin GTT which was discontinued  Cont amiodarone   Cont coreg bid. 12.5 mg  , now rate better control -will need cardiology follow-up   4. cardiomyopathy -EF 55-60% 12.30 appears euvolemic   Cont ASA and BB Holding ACE due to AKI-was on lasix as well and this was also similarily held on d/c  5. HTN-Added Hydralazine to above cardiac meds for htn contorl  Antibiotics:   avelox (CAP)12/29>>>1.5.13 Ceftriax (CAP)12/29>>>12.31 vanc (CAP) 12/29>>>1.1.13    Significant Diagnostic Studies:  Blood cult x 2 neg  urine cult neg Sputum culture Non-Pathogenic Oropharyngeal-type Flora Isolated.  ECHO 12/30 - Technically difficult study with poor acoustic windows.   Very hard to see anything. Definity contrast was used. I   think that the LV systolic function is normal. The RV  appears mildly dilated with probably normal systolic   function. Dilated IVC suggests elevated RV filling   pressure. Would need TEE to better visualize LV and   valves.  PORTABLE CHEST - 1 VIEW     Comparison: 06/12/2011    IMPRESSION:   1.  Right arm PICC line tip is in the mid right atrium, approximately 5 cm below the expected level of the cavoatrial junction. 2.  Stable cardiomegaly and bibasilar atelectasis versus infiltrates.   PORTABLE CHEST - 1 VIEW   Comparison: Portable chest x-ray of 06/11/2011   Findings: Aeration has improved.  There is still patchy airspace disease at the lung bases.  Small effusions may be present. Cardiomegaly is stable.  A pacemaker with AICD leads remains.   IMPRESSION:   1.  Improved aeration. 2.  Persistent opacity at the lung bases. 3.  Stable cardiomegaly.    Consults:  Cardiology Donnie Aho)   Renal ( coladonato--have signed off for now and needs outpt follow up)   Urology ( MacSherilyn Cooter) --saw for BOO on 12/29   Inpatient rehab consult ( called on 1/8)   Treatments: Antibiotics, cardioversion, Diltiazem and Heparin Gtt's, Coumadin  Discharge Exam: Blood pressure 138/86, pulse 58, temperature 97.9 F (36.6 C), temperature source Oral, resp. rate 20, height 5\' 6"  (1.676 m), weight 125.2 kg (276 lb 0.3 oz), SpO2 92.00%. Alert, morbidly obese s1 s2 no m-tele =brady to the 50's cta b abd obese nt/nd Neuro grossly intact  Disposition:  Follow-up Appointments: Discharge Orders    Future Orders Please Complete By Expires   Diet - low sodium heart healthy      Increase activity slowly      Call MD for:  temperature >100.4      Call MD for:  redness, tenderness, or signs of infection (pain, swelling, redness, odor or green/yellow discharge around incision site)      Call MD for:  hives      Call MD for:  extreme fatigue      (HEART FAILURE PATIENTS) Call MD:  Anytime you have any of the following symptoms: 1) 3 pound weight gain in 24 hours or 5 pounds in 1 week 2) shortness of breath, with or without a dry hacking cough 3) swelling in the hands, feet or stomach 4) if you have to sleep on extra pillows at night in order to  breathe.         Discharge Medications: Current Discharge Medication List    START taking these medications   Details  hydrALAZINE (APRESOLINE) 25 MG tablet Take 1 tablet (25 mg total) by mouth 2 (two) times daily. Qty: 60 tablet, Refills: 0      CONTINUE these medications which have CHANGED   Details  carvedilol (COREG) 25 MG tablet Take 0.5 tablets (12.5 mg total) by mouth 2 (two) times daily with a meal. Qty: 60 tablet, Refills: 0    warfarin (COUMADIN) 5 MG tablet Take 0.5 tablets (2.5 mg total) by mouth daily. Qty: 10 tablet, Refills: 0      CONTINUE these medications which have NOT CHANGED   Details  acetaminophen (TYLENOL EX ST ARTHRITIS PAIN) 500 MG tablet Take 500 mg by mouth every 6 (six) hours as needed. For pain     albuterol (PROVENTIL HFA;VENTOLIN HFA) 108 (90 BASE) MCG/ACT inhaler Inhale 2 puffs into the lungs every 4 (four) hours as needed. For shortness of breath     allopurinol (ZYLOPRIM) 300 MG tablet Take 300 mg by mouth  daily.     amiodarone (PACERONE) 200 MG tablet Take 200 mg by mouth daily.      aspirin 81 MG chewable tablet Chew 81 mg by mouth daily.      budesonide-formoterol (SYMBICORT) 160-4.5 MCG/ACT inhaler Inhale 2 puffs into the lungs 2 (two) times daily.      levothyroxine (SYNTHROID, LEVOTHROID) 100 MCG tablet Take 100 mcg by mouth daily.     loratadine (CLARITIN) 10 MG tablet Take 10 mg by mouth daily.        STOP taking these medications     benazepril (LOTENSIN) 40 MG tablet      fenofibrate micronized (LOFIBRA) 134 MG capsule      furosemide (LASIX) 40 MG tablet      guaiFENesin (MUCINEX) 600 MG 12 hr tablet      isosorbide mononitrate (IMDUR) 30 MG 24 hr tablet      potassium chloride SA (K-DUR,KLOR-CON) 20 MEQ tablet        Follow-up Information    Follow up with TILLEY JR,W SPENCER, MD. Call in 2 weeks. (As needed)    Contact information:   [address] [place]  [address] [phone]         Signed: Pleas Koch 06/19/2011, 11:31 AM

## 2011-06-19 NOTE — Progress Notes (Signed)
ANTICOAGULATION CONSULT NOTE - Follow Up Consult  Pharmacy Consult for Heparin & Coumadin Indication: atrial fibrillation  No Known Allergies  Patient Measurements: Height: 5\' 6"  (167.6 cm) Weight: 276 lb 0.3 oz (125.2 kg) IBW/kg (Calculated) : 63.8  Adjusted Body Weight: 93 kg  Vital Signs: Temp: 97.9 F (36.6 C) (01/11 0441) Temp src: Oral (01/11 0441) BP: 138/86 mmHg (01/11 0441) Pulse Rate: 58  (01/11 0441)  Labs:  Basename 06/19/11 0527 06/18/11 1410 06/18/11 0555 06/17/11 2245 06/17/11 0547  HGB 11.6* -- 12.4* -- --  HCT 38.4* -- 40.6 -- 40.0  PLT 191 -- 195 -- 204  APTT -- -- -- -- --  LABPROT 30.5* -- 25.3* -- 21.3*  INR 2.87* -- 2.26* -- 1.81*  HEPARINUNFRC 0.54 0.34 0.26* -- --  CREATININE 3.14* -- 3.05* 3.14* --  CKTOTAL -- -- -- -- --  CKMB -- -- -- -- --  TROPONINI -- -- -- -- --   Estimated Creatinine Clearance: 28.5 ml/min (by C-G formula based on Cr of 3.14).  Assessment:   Heparin level and INR therapeutic today.   Heparin drip stopped this morning.   INR is trending up.  Has had Coumadin 5 mg daily for the last 4 days.  On Amiodarone 200 mg daily as at home.  Goal of Therapy:  INR 2-3   Plan:    Will decrease today's Coumadin dose to 2.5 mg.   Continue daily PT/INR.  Dennie Fetters Pager: [phone] 06/19/2011,10:59 AM

## 2011-06-19 NOTE — Progress Notes (Signed)
Physical Therapy Treatment Patient Details Name: Cody Faulkner MRN: [id number] DOB: 04-30-1946 Today's Date: 06/19/2011  PT Assessment/Plan  PT - Assessment/Plan Comments on Treatment Session: Pt admitted with respiratory failure and continues to limited by activity tolerance and oxygen. Pt on 5L throughout mobility with 88% and back to 95% and down to 3L end of session. Pt progressing well and no longer needs +2 assist but requires cues for safety as he will sit before fully to surface.  PT Plan: Discharge plan needs to be updated Follow Up Recommendations: Skilled nursing facility Equipment Recommended: Defer to next venue PT Goals  Acute Rehab PT Goals PT Goal: Supine/Side to Sit - Progress: Progressing toward goal PT Goal: Sit to Supine/Side - Progress: Progressing toward goal PT Goal: Sit to Stand - Progress: Progressing toward goal PT Goal: Stand to Sit - Progress: Progressing toward goal PT Transfer Goal: Bed to Chair/Chair to Bed - Progress: Progressing toward goal PT Goal: Stand - Progress: Progressing toward goal PT Goal: Ambulate - Progress: Progressing toward goal PT Goal: Perform Home Exercise Program - Progress: Progressing toward goal  PT Treatment Precautions/Restrictions  Precautions Precautions: Fall Precaution Comments: O2 Restrictions Weight Bearing Restrictions: No Mobility (including Balance) Bed Mobility Supine to Sit: With rails;5: Supervision;HOB elevated (Comment degrees) (20 degrees) Supine to Sit Details (indicate cue type and reason): pt with increased time to pivot to EOB with use of rail Sitting - Scoot to Edge of Bed: 5: Supervision Sitting - Scoot to [place] of Bed Details (indicate cue type and reason): cues for reciprocal scooting Sit to Supine: 4: Min assist;HOB flat Sit to Supine - Details (indicate cue type and reason): Assist to elevate bil LE into bed Transfers Sit to Stand: 4: Min assist;From bed Sit to Stand Details (indicate cue type and  reason): x2 trials, cues for hand placement and safety Stand to Sit: 4: Min assist;To bed Stand to Sit Details: x 2 trials Ambulation/Gait Ambulation/Gait Assistance: 4: Min assist Ambulation/Gait Assistance Details (indicate cue type and reason): Cues to step into RW Ambulation Distance (Feet): 26 Feet Assistive device: Rolling walker Gait Pattern: Trunk flexed;Decreased stride length (wide BOS)  Static Standing Balance Static Standing - Balance Support: No upper extremity supported Static Standing - Level of Assistance: 6: Modified independent (Device/Increase time) Static Standing - Comment/# of Minutes: 5 Exercise  General Exercises - Lower Extremity Long Arc Quad: Right;10 reps;AROM;Seated Hip Flexion/Marching: AROM;Both;10 reps;Seated End of Session PT - End of Session Equipment Utilized During Treatment: Gait belt Activity Tolerance: Patient tolerated treatment well;Patient limited by fatigue Patient left: in bed;with call bell in reach Nurse Communication: Mobility status for transfers;Mobility status for ambulation General Behavior During Session: [facility] for tasks performed Cognition: [facility] for tasks performed  Delorse Lek 06/19/2011, 3:31 PM Toney Sang, PT [phone]

## 2011-06-19 NOTE — Progress Notes (Signed)
Pt to d/c to [facility] today by ambulance.  Family is aware and helping with admission.

## 2011-06-19 NOTE — Progress Notes (Signed)
Utilization review complete 

## 2011-06-19 NOTE — Progress Notes (Signed)
Subjective:  Improving.  Remains in NSR post cardioversion yesterday.  Still hoarse and coughing. Overlap of 24 hours on heparin now. Still weak.  Objective:  Vital Signs in the last 24 hours: BP 138/86  Pulse 58  Temp(Src) 97.9 F (36.6 C) (Oral)  Resp 20  Ht 5\' 6"  (1.676 m)  Wt 125.2 kg (276 lb 0.3 oz)  BMI 44.55 kg/m2  SpO2 92%   Physical Exam: Obese WM hoarse, in NAD Lungs:  Coarse rhonchi Cardiac:  Regular rhythm, normal S1 and S2, no S3 Abdomen:  Soft, nontender, no masses Extremities:  1+ edema, venous insufficiency changes  Intake/Output from previous day: 01/10 0701 - 01/11 0700 In: 835 [P.O.:440; I.V.:395] Out: 3000 [Urine:3000]  Lab Results: Basic Metabolic Panel:  Basename 06/19/11 0527 06/18/11 0555  NA 146* 145  K 4.7 5.0  CL 108 108  CO2 32 31  GLUCOSE 97 91  BUN 51* 54*  CREATININE 3.14* 3.05*   PROTIME: Lab Results  Component Value Date   INR 2.87* 06/19/2011   INR 2.26* 06/18/2011   INR 1.81* 06/17/2011    Telemetry: A fib rate reasonable control  Assessment/Plan:  1. Atrial fibrillation now in sinus ryhthm 2. Acute renal failure plateaued 3. Warfarin anticoagulation now therapeutic  Continue heparin another 24 hours. 4. History of cardiomyopathy now improved.    Rec:  D/c heparin now that therapeutic on warfarin. Hold ACE since ARF.  He has tolerated this well in past and could restart if needed.  Since BP up hydralazine may be better choice. May go to SNF or rehab from cardiac perspective.  Darden Palmer.  MD [facility] 06/19/2011, 9:44 AM

## 2011-06-19 NOTE — Progress Notes (Addendum)
Occupational Therapy Treatment Patient Details Name: Alfredo Spong MRN: [id number] DOB: Aug 24, 1945 Today's Date: 06/19/2011  OT Assessment/Plan OT Assessment/Plan OT Plan: Discharge plan remains appropriate Follow Up Recommendations: Skilled nursing facility Equipment Recommended: Defer to next venue OT Goals ADL Goals ADL Goal: Grooming - Progress: Progressing toward goals Pt Will Perform Upper Body Bathing: with set-up;with supervision;Standing at sink ADL Goal: Upper Body Bathing - Progress: Revised (modified due to lack of progress/goal met) ADL Goal: Lower Body Bathing - Progress: Progressing toward goals Pt Will Transfer to Toilet: with supervision;Extra wide 3-in-1;Ambulation ADL Goal: Toilet Transfer - Progress: Revised (modified due to lack of progress/goal met)  OT Treatment Precautions/Restrictions  Precautions Precautions: Fall Restrictions Weight Bearing Restrictions: No   ADL ADL Grooming: Performed Grooming Details (indicate cue type and reason): Min guard assist at sink. patient required VC for upright standing and rest breaks secondary to fatigue Where Assessed - Grooming: Standing at sink Upper Body Bathing: Performed;Set up;Right arm;Chest;Left arm;Abdomen Upper Body Bathing Details (indicate cue type and reason): unable to perform while standing secondary to fatigue; perfomed sitting at sink Where Assessed - Upper Body Bathing: Sitting at sink Lower Body Bathing: Maximal assistance;Performed Where Assessed - Lower Body Bathing: Sit to stand from chair Toilet Transfer: Simulated;Moderate assistance Toilet Transfer Details (indicate cue type and reason): simulated EOB to chair; verbal cues for hand placement, physical assist for timing to move RW, and weight shifts Toilet Transfer Method: Ambulating Equipment Used: Rolling walker Ambulation Related to ADLs: Patient up to Mod assist with RW ambulation when fatigued.  Mobility  Transfers Sit to Stand: 4: Min  assist;From bed;From chair/3-in-1;With armrests;With upper extremity assist Sit to Stand Details (indicate cue type and reason): VC for hand placement from various surfaces Stand to Sit: 4: Min assist;With armrests;To chair/3-in-1 Stand to Sit Details: VC for hand placement Pt. Min assist supine to sit EOB with HOB elevated to ~30 degrees  End of Session OT - End of Session Equipment Utilized During Treatment: Gait belt Activity Tolerance: Patient limited by fatigue- O2 sats decrease to 88-90% on 3L O2 with activity. Unable to obtain O2 sats mid-way through session. 90% at end of session when sitting up in chair Patient left: in chair;with call bell in reach General Behavior During Session: [facility] for tasks performed Cognition: [facility] for tasks performed  Advit Trethewey  06/19/2011, 1:55 PM

## 2011-06-19 NOTE — Progress Notes (Signed)
DC'd foley per MD order per hospital policy. Patient tolerated well. Urine output 400 ml. After patient voids he can go home. Bernita Raisin, RN

## 2011-06-22 ENCOUNTER — Encounter (HOSPITAL_COMMUNITY): Payer: Self-pay | Admitting: Emergency Medicine

## 2011-06-22 ENCOUNTER — Inpatient Hospital Stay (HOSPITAL_COMMUNITY)
Admission: EM | Admit: 2011-06-22 | Discharge: 2011-07-06 | DRG: 291 | Disposition: A | Discharge status: Skilled Nursing Facility | Payer: Medicare Other | Attending: Family Medicine | Admitting: Family Medicine

## 2011-06-22 DIAGNOSIS — K5900 Constipation, unspecified: Secondary | ICD-10-CM | POA: Diagnosis not present

## 2011-06-22 DIAGNOSIS — I4891 Unspecified atrial fibrillation: Secondary | ICD-10-CM

## 2011-06-22 DIAGNOSIS — Z905 Acquired absence of kidney: Secondary | ICD-10-CM

## 2011-06-22 DIAGNOSIS — I129 Hypertensive chronic kidney disease with stage 1 through stage 4 chronic kidney disease, or unspecified chronic kidney disease: Secondary | ICD-10-CM | POA: Diagnosis present

## 2011-06-22 DIAGNOSIS — I509 Heart failure, unspecified: Secondary | ICD-10-CM

## 2011-06-22 DIAGNOSIS — I2510 Atherosclerotic heart disease of native coronary artery without angina pectoris: Secondary | ICD-10-CM | POA: Insufficient documentation

## 2011-06-22 DIAGNOSIS — E875 Hyperkalemia: Secondary | ICD-10-CM

## 2011-06-22 DIAGNOSIS — G4730 Sleep apnea, unspecified: Secondary | ICD-10-CM

## 2011-06-22 DIAGNOSIS — Z95810 Presence of automatic (implantable) cardiac defibrillator: Secondary | ICD-10-CM

## 2011-06-22 DIAGNOSIS — A419 Sepsis, unspecified organism: Secondary | ICD-10-CM

## 2011-06-22 DIAGNOSIS — E039 Hypothyroidism, unspecified: Secondary | ICD-10-CM | POA: Insufficient documentation

## 2011-06-22 DIAGNOSIS — Q600 Renal agenesis, unilateral: Secondary | ICD-10-CM

## 2011-06-22 DIAGNOSIS — G9349 Other encephalopathy: Secondary | ICD-10-CM | POA: Diagnosis present

## 2011-06-22 DIAGNOSIS — I472 Ventricular tachycardia, unspecified: Secondary | ICD-10-CM

## 2011-06-22 DIAGNOSIS — M109 Gout, unspecified: Secondary | ICD-10-CM | POA: Diagnosis present

## 2011-06-22 DIAGNOSIS — N179 Acute kidney failure, unspecified: Secondary | ICD-10-CM

## 2011-06-22 DIAGNOSIS — T82198A Other mechanical complication of other cardiac electronic device, initial encounter: Secondary | ICD-10-CM

## 2011-06-22 DIAGNOSIS — IMO0002 Reserved for concepts with insufficient information to code with codable children: Secondary | ICD-10-CM

## 2011-06-22 DIAGNOSIS — I5022 Chronic systolic (congestive) heart failure: Secondary | ICD-10-CM

## 2011-06-22 DIAGNOSIS — N183 Chronic kidney disease, stage 3 unspecified: Secondary | ICD-10-CM

## 2011-06-22 DIAGNOSIS — E662 Morbid (severe) obesity with alveolar hypoventilation: Secondary | ICD-10-CM | POA: Diagnosis present

## 2011-06-22 DIAGNOSIS — J45909 Unspecified asthma, uncomplicated: Secondary | ICD-10-CM | POA: Diagnosis present

## 2011-06-22 DIAGNOSIS — I119 Hypertensive heart disease without heart failure: Secondary | ICD-10-CM

## 2011-06-22 DIAGNOSIS — G4733 Obstructive sleep apnea (adult) (pediatric): Secondary | ICD-10-CM | POA: Diagnosis present

## 2011-06-22 DIAGNOSIS — I420 Dilated cardiomyopathy: Secondary | ICD-10-CM | POA: Insufficient documentation

## 2011-06-22 DIAGNOSIS — J189 Pneumonia, unspecified organism: Secondary | ICD-10-CM

## 2011-06-22 DIAGNOSIS — J4530 Mild persistent asthma, uncomplicated: Secondary | ICD-10-CM | POA: Insufficient documentation

## 2011-06-22 DIAGNOSIS — R5381 Other malaise: Secondary | ICD-10-CM | POA: Diagnosis present

## 2011-06-22 DIAGNOSIS — I5033 Acute on chronic diastolic (congestive) heart failure: Principal | ICD-10-CM | POA: Diagnosis present

## 2011-06-22 DIAGNOSIS — J9600 Acute respiratory failure, unspecified whether with hypoxia or hypercapnia: Secondary | ICD-10-CM

## 2011-06-22 DIAGNOSIS — I480 Paroxysmal atrial fibrillation: Secondary | ICD-10-CM | POA: Insufficient documentation

## 2011-06-22 DIAGNOSIS — R7309 Other abnormal glucose: Secondary | ICD-10-CM | POA: Diagnosis not present

## 2011-06-22 DIAGNOSIS — Z7901 Long term (current) use of anticoagulants: Secondary | ICD-10-CM

## 2011-06-22 DIAGNOSIS — N189 Chronic kidney disease, unspecified: Secondary | ICD-10-CM

## 2011-06-22 DIAGNOSIS — R4789 Other speech disturbances: Secondary | ICD-10-CM | POA: Diagnosis present

## 2011-06-22 DIAGNOSIS — I428 Other cardiomyopathies: Secondary | ICD-10-CM | POA: Diagnosis present

## 2011-06-22 NOTE — ED Notes (Signed)
As per EMS. Pt c/o SOB after PT; SNF call doctor and were told to transport to [facility]

## 2011-06-22 NOTE — Progress Notes (Signed)
   CARE MANAGEMENT NOTE 06/22/2011  Patient:  Cody Faulkner, Cody Faulkner   Account Number:  [id number]  Date Initiated:  06/08/2011  Documentation initiated by:  [facility]  Subjective/Objective Assessment:   collapsed at home - resp failure - intubated.  Ind prior to admission.     Action/Plan:   PT/OT evals, CIR consult   Anticipated DC Date:  06/19/2011   Anticipated DC Plan:  SKILLED NURSING FACILITY  In-house referral  Clinical Social Worker      DC Planning Services  CM consult      Choice offered to / List presented to:             Status of service:  Completed, signed off Medicare Important Message given?   (If response is "NO", the following Medicare IM given date fields will be blank) Date Medicare IM given:   Date Additional Medicare IM given:    Discharge Disposition:  SKILLED NURSING FACILITY  Per UR Regulation:  Reviewed for med. necessity/level of care/duration of stay  Comments:  06/22/11 8:52 Letha Cape RN, BSN [phone] Patient dc to snf on 06/19/11.  06/18/11- 1130- Donn Pierini RN, BSN [phone] CIR consulted completed, CIR will not be offering a bed, pt will need SNF placement. CSW following for SNF placement. Pt for cardioversion today per cards. Plan for SNF tomorrow if medically cleared for discharge.  06-11-11 9am Avie Arenas, RNBSN [phone] UR Completed.  06-08-11 11am Avie Arenas, RNBSN [phone] UR completed.

## 2011-06-22 NOTE — ED Notes (Signed)
JYN:[id number]<AO> Expected date:06/22/11<BR> Expected time:11:10 PM<BR> Means of arrival:Ambulance<BR> Comments:<BR> EMS 2 FH - possible pneumonia

## 2011-06-23 ENCOUNTER — Other Ambulatory Visit: Payer: Self-pay

## 2011-06-23 ENCOUNTER — Encounter (HOSPITAL_COMMUNITY): Payer: Self-pay

## 2011-06-23 ENCOUNTER — Inpatient Hospital Stay (HOSPITAL_COMMUNITY): Payer: Medicare Other

## 2011-06-23 ENCOUNTER — Emergency Department (HOSPITAL_COMMUNITY): Payer: Medicare Other

## 2011-06-23 DIAGNOSIS — N189 Chronic kidney disease, unspecified: Secondary | ICD-10-CM

## 2011-06-23 DIAGNOSIS — I5021 Acute systolic (congestive) heart failure: Secondary | ICD-10-CM

## 2011-06-23 DIAGNOSIS — R0902 Hypoxemia: Secondary | ICD-10-CM

## 2011-06-23 DIAGNOSIS — J9600 Acute respiratory failure, unspecified whether with hypoxia or hypercapnia: Secondary | ICD-10-CM

## 2011-06-23 LAB — BLOOD GAS, ARTERIAL
Acid-Base Excess: 3.8 mmol/L — ABNORMAL HIGH (ref 0.0–2.0)
Bicarbonate: 33.8 mEq/L — ABNORMAL HIGH (ref 20.0–24.0)
Drawn by: 340271
Expiratory PAP: 6
FIO2: 0.4 %
Inspiratory PAP: 12
Mode: POSITIVE
O2 Saturation: 94.6 %
Patient temperature: 98.6
Patient temperature: 98.6
RATE: 10 resp/min
TCO2: 27.2 mmol/L (ref 0–100)
TCO2: 30.8 mmol/L (ref 0–100)
TCO2: 31.1 mmol/L (ref 0–100)
pCO2 arterial: 53.7 mmHg — ABNORMAL HIGH (ref 35.0–45.0)
pCO2 arterial: 63.2 mmHg (ref 35.0–45.0)
pH, Arterial: 7.36 (ref 7.350–7.450)
pH, Arterial: 7.35 (ref 7.350–7.450)
pO2, Arterial: 92.4 mmHg (ref 80.0–100.0)

## 2011-06-23 LAB — POCT I-STAT, CHEM 8
Creatinine, Ser: 2.4 mg/dL — ABNORMAL HIGH (ref 0.50–1.35)
Glucose, Bld: 100 mg/dL — ABNORMAL HIGH (ref 70–99)
HCT: 36 % — ABNORMAL LOW (ref 39.0–52.0)
Hemoglobin: 12.2 g/dL — ABNORMAL LOW (ref 13.0–17.0)
Sodium: 144 mEq/L (ref 135–145)
TCO2: 28 mmol/L (ref 0–100)

## 2011-06-23 LAB — CARDIAC PANEL(CRET KIN+CKTOT+MB+TROPI)
CK, MB: 1.8 ng/mL (ref 0.3–4.0)
Relative Index: INVALID (ref 0.0–2.5)
Total CK: 38 U/L (ref 7–232)
Total CK: 34 U/L (ref 7–232)

## 2011-06-23 LAB — BASIC METABOLIC PANEL
BUN: 34 mg/dL — ABNORMAL HIGH (ref 6–23)
CO2: 31 mEq/L (ref 19–32)
CO2: 29 mEq/L (ref 19–32)
Calcium: 9.1 mg/dL (ref 8.4–10.5)
Chloride: 104 mEq/L (ref 96–112)
Creatinine, Ser: 2.47 mg/dL — ABNORMAL HIGH (ref 0.50–1.35)
GFR calc Af Amer: 29 mL/min — ABNORMAL LOW (ref >90)
Sodium: 142 mEq/L (ref 135–145)

## 2011-06-23 LAB — GLUCOSE, CAPILLARY
Glucose-Capillary: 91 mg/dL (ref 70–99)
Glucose-Capillary: 91 mg/dL (ref 70–99)
Glucose-Capillary: 95 mg/dL (ref 70–99)

## 2011-06-23 LAB — PRO B NATRIURETIC PEPTIDE: Pro B Natriuretic peptide (BNP): 893.7 pg/mL — ABNORMAL HIGH (ref 0–125)

## 2011-06-23 LAB — CBC
HCT: 37.3 % — ABNORMAL LOW (ref 39.0–52.0)
MCV: 89.6 fL (ref 78.0–100.0)
MCV: 89.2 fL (ref 78.0–100.0)
Platelets: 222 10*3/uL (ref 150–400)
RBC: 4.14 MIL/uL — ABNORMAL LOW (ref 4.22–5.81)
RBC: 4.18 MIL/uL — ABNORMAL LOW (ref 4.22–5.81)
WBC: 6.1 10*3/uL (ref 4.0–10.5)
WBC: 6 10*3/uL (ref 4.0–10.5)

## 2011-06-23 LAB — MRSA PCR SCREENING
MRSA by PCR: INVALID — AB
MRSA by PCR: NEGATIVE

## 2011-06-23 LAB — CULTURE, BLOOD (ROUTINE X 2): Culture: NO GROWTH

## 2011-06-23 LAB — POCT I-STAT TROPONIN I: Troponin i, poc: 0 ng/mL (ref 0.00–0.08)

## 2011-06-23 LAB — PROTIME-INR: Prothrombin Time: 26.1 seconds — ABNORMAL HIGH (ref 11.6–15.2)

## 2011-06-23 MED ORDER — WARFARIN SODIUM 2.5 MG PO TABS
2.5000 mg | ORAL_TABLET | Freq: Every day | ORAL | Status: DC
  Administered 2011-06-23: 2.5 mg via ORAL
  Filled 2011-06-23 (×2): qty 1

## 2011-06-23 MED ORDER — SODIUM CHLORIDE 0.9 % IV SOLN
250.0000 mL | INTRAVENOUS | Status: DC | PRN
  Administered 2011-06-25: 250 mL via INTRAVENOUS

## 2011-06-23 MED ORDER — FUROSEMIDE 10 MG/ML IJ SOLN
80.0000 mg | Freq: Four times a day (QID) | INTRAMUSCULAR | Status: AC
  Administered 2011-06-23 (×3): 80 mg via INTRAVENOUS
  Filled 2011-06-23 (×3): qty 8

## 2011-06-23 MED ORDER — BIOTENE DRY MOUTH MT LIQD
15.0000 mL | Freq: Two times a day (BID) | OROMUCOSAL | Status: DC
  Administered 2011-06-23 – 2011-07-06 (×23): 15 mL via OROMUCOSAL

## 2011-06-23 MED ORDER — AMIODARONE HCL 200 MG PO TABS
200.0000 mg | ORAL_TABLET | Freq: Every day | ORAL | Status: DC
  Administered 2011-06-24 – 2011-06-27 (×4): 200 mg via ORAL
  Filled 2011-06-23 (×6): qty 1

## 2011-06-23 MED ORDER — PIPERACILLIN-TAZOBACTAM 3.375 G IVPB 30 MIN
3.3750 g | INTRAVENOUS | Status: AC
  Administered 2011-06-23: 3.375 g via INTRAVENOUS
  Filled 2011-06-23: qty 50

## 2011-06-23 MED ORDER — VANCOMYCIN HCL IN DEXTROSE 1-5 GM/200ML-% IV SOLN
1000.0000 mg | Freq: Once | INTRAVENOUS | Status: AC
  Administered 2011-06-23: 1000 mg via INTRAVENOUS
  Filled 2011-06-23: qty 200

## 2011-06-23 MED ORDER — ALBUTEROL SULFATE (5 MG/ML) 0.5% IN NEBU
2.5000 mg | INHALATION_SOLUTION | RESPIRATORY_TRACT | Status: DC | PRN
  Administered 2011-06-24 – 2011-07-03 (×2): 2.5 mg via RESPIRATORY_TRACT
  Filled 2011-06-23 (×4): qty 0.5

## 2011-06-23 MED ORDER — FUROSEMIDE 10 MG/ML IJ SOLN
40.0000 mg | Freq: Once | INTRAMUSCULAR | Status: AC
  Administered 2011-06-23: 40 mg via INTRAVENOUS
  Filled 2011-06-23: qty 4

## 2011-06-23 MED ORDER — PIPERACILLIN-TAZOBACTAM 3.375 G IVPB: 3.3750 g | Freq: Once | INTRAVENOUS | Status: DC

## 2011-06-23 MED ORDER — ASPIRIN 300 MG RE SUPP: 300.0000 mg | RECTAL | Status: AC

## 2011-06-23 MED ORDER — ASPIRIN 81 MG PO CHEW
324.0000 mg | CHEWABLE_TABLET | Freq: Once | ORAL | Status: AC
  Administered 2011-06-23: 324 mg via ORAL
  Filled 2011-06-23: qty 4

## 2011-06-23 MED ORDER — HYDRALAZINE HCL 25 MG PO TABS
25.0000 mg | ORAL_TABLET | Freq: Two times a day (BID) | ORAL | Status: DC
  Administered 2011-06-23 – 2011-07-02 (×18): 25 mg via ORAL
  Filled 2011-06-23 (×20): qty 1

## 2011-06-23 MED ORDER — LEVOTHYROXINE SODIUM 100 MCG PO TABS
100.0000 ug | ORAL_TABLET | Freq: Every day | ORAL | Status: DC
  Administered 2011-06-24 – 2011-07-06 (×13): 100 ug via ORAL
  Filled 2011-06-23 (×15): qty 1

## 2011-06-23 MED ORDER — CARVEDILOL 6.25 MG PO TABS
6.2500 mg | ORAL_TABLET | Freq: Two times a day (BID) | ORAL | Status: DC
  Administered 2011-06-23 – 2011-06-27 (×8): 6.25 mg via ORAL
  Filled 2011-06-23 (×10): qty 1

## 2011-06-23 MED ORDER — BUDESONIDE-FORMOTEROL FUMARATE 160-4.5 MCG/ACT IN AERO
2.0000 | INHALATION_SPRAY | Freq: Two times a day (BID) | RESPIRATORY_TRACT | Status: DC
  Administered 2011-06-23 – 2011-06-27 (×9): 2 via RESPIRATORY_TRACT
  Filled 2011-06-23: qty 6

## 2011-06-23 MED ORDER — STARCH (THICKENING) PO POWD
5.0000 g | ORAL | Status: DC | PRN
  Filled 2011-06-23 (×3): qty 5

## 2011-06-23 MED ORDER — IPRATROPIUM BROMIDE 0.02 % IN SOLN
0.5000 mg | RESPIRATORY_TRACT | Status: DC | PRN
  Administered 2011-06-24: 0.5 mg via RESPIRATORY_TRACT
  Filled 2011-06-23 (×3): qty 2.5

## 2011-06-23 MED ORDER — ALLOPURINOL 300 MG PO TABS
300.0000 mg | ORAL_TABLET | Freq: Every day | ORAL | Status: DC
  Administered 2011-06-24 – 2011-07-06 (×13): 300 mg via ORAL
  Filled 2011-06-23 (×15): qty 1

## 2011-06-23 MED ORDER — DEXTROSE 10 % IV SOLN: INTRAVENOUS | Status: DC

## 2011-06-23 MED ORDER — INSULIN ASPART 100 UNIT/ML ~~LOC~~ SOLN
0.0000 [IU] | SUBCUTANEOUS | Status: DC
  Filled 2011-06-23: qty 3

## 2011-06-23 MED ORDER — IPRATROPIUM BROMIDE 0.02 % IN SOLN
0.5000 mg | RESPIRATORY_TRACT | Status: DC
  Administered 2011-06-23 – 2011-06-24 (×7): 0.5 mg via RESPIRATORY_TRACT
  Filled 2011-06-23 (×14): qty 2.5

## 2011-06-23 MED ORDER — HEPARIN SODIUM (PORCINE) 5000 UNIT/ML IJ SOLN
5000.0000 [IU] | Freq: Three times a day (TID) | INTRAMUSCULAR | Status: DC
  Administered 2011-06-23 – 2011-06-24 (×3): 5000 [IU] via SUBCUTANEOUS
  Filled 2011-06-23 (×7): qty 1

## 2011-06-23 MED ORDER — ASPIRIN 81 MG PO CHEW: 324.0000 mg | CHEWABLE_TABLET | ORAL | Status: AC

## 2011-06-23 MED ORDER — FUROSEMIDE 10 MG/ML IJ SOLN
80.0000 mg | Freq: Once | INTRAMUSCULAR | Status: AC
  Administered 2011-06-23: 80 mg via INTRAVENOUS
  Filled 2011-06-23: qty 8

## 2011-06-23 MED ORDER — CHLORHEXIDINE GLUCONATE 0.12 % MT SOLN
15.0000 mL | Freq: Two times a day (BID) | OROMUCOSAL | Status: DC
  Administered 2011-06-23 – 2011-06-30 (×14): 15 mL via OROMUCOSAL
  Filled 2011-06-23 (×16): qty 15

## 2011-06-23 MED ORDER — ALBUTEROL SULFATE (5 MG/ML) 0.5% IN NEBU
2.5000 mg | INHALATION_SOLUTION | RESPIRATORY_TRACT | Status: DC
  Administered 2011-06-23 – 2011-06-24 (×7): 2.5 mg via RESPIRATORY_TRACT
  Filled 2011-06-23 (×13): qty 0.5

## 2011-06-23 NOTE — Progress Notes (Signed)
Patient refusing to wear bipap at this time, placed patient on 50% venti mask o2 sat 92-95%.

## 2011-06-23 NOTE — ED Provider Notes (Signed)
History     CSN: [id number]  Arrival date & time 06/22/11  2309   First MD Initiated Contact with Patient 06/23/11 0006      Chief Complaint  Patient presents with  . Shortness of Breath  . Hypertension   patient was sent from the skilled nursing facility. Became shortness of breath after physical therapy. Patient has complex past medical history including recent admission for respiratory failure. Also, known history of COPD, A. fib, hypertension, obstructive sleep apnea, and obesity.  Family states he was discharged on Friday, but they felt he should have stayed additional time in the hospital. He was treated for community acquired pneumonia, atrial fibrillation. History of dilated cardiomyopathy, chronic systolic heart failure. Sepsis and renal failure. Also, part of his recent discharge summary. Patient was intubated on his last admission. The patient has been having increasing nonproductive cough and bilateral lower extremity edema. Initial O2 sats are 98, 100%.  (Consider location/radiation/quality/duration/timing/severity/associated sxs/prior treatment) HPI  Past Medical History  Diagnosis Date  . COPD exacerbation   . Atrial fibrillation     Cardioversion 2007  . Hypertension   . OSA (obstructive sleep apnea)     declines mask  . Depression   . Gouty arthritis   . CAD (coronary artery disease)     predominantly single vessel  . Ventricular tachycardia     s/p defibrillator-Medtronic EnTrust [id number]  . Dilated cardiomyopathy     s/p defibrillator Medtronic EnTrust [phone]  . Asthma   . 5621 Lead   . Implantable Defibrillator     Medtronic Entrust  . Chronic systolic heart failure   . Hypothyroidism   . Acute renal failure 06/06/2011  . Solitary kidney 06/05/2006  . Gout   . CHF (congestive heart failure)   . Pneumonia, community acquired     bilateral bibasilar   . Shortness of breath on exertion     Past Surgical History  Procedure Date  . Cardiac defibrillator  placement 2007    Medtronic EnTrust [phone]  . Nephrectomy 1976    Right  . Cardiac catheterization 2007    60% Stenosis mid-CFX  . Knee arthroscopy     left  . Cardioversion 06/18/2011    Procedure: CARDIOVERSION;  Surgeon: Darden Palmer., MD;  Location: [facility] OR;  Service: Cardiovascular;  Laterality: N/A;    Family History  Problem Relation Age of Onset  . Heart disease Other     uncle  . Diabetes Other     uncle    History  Substance Use Topics  . Smoking status: Never Smoker   . Smokeless tobacco: Never Used  . Alcohol Use: Yes     06/17/11 "aien't drank nothing for 4 years now"      Review of Systems  Constitutional: Positive for fatigue. Negative for fever and diaphoresis.  Respiratory: Negative for chest tightness.   Cardiovascular: Positive for leg swelling.  Neurological: Negative for dizziness.  Psychiatric/Behavioral: Negative for confusion.  All other systems reviewed and are negative.    Allergies  Review of patient's allergies indicates no known allergies.  Home Medications   Current Outpatient Rx  Name Route Sig Dispense Refill  . ACETAMINOPHEN 500 MG PO TABS Oral Take 500 mg by mouth every 6 (six) hours as needed. For pain     . ALBUTEROL SULFATE HFA 108 (90 BASE) MCG/ACT IN AERS Inhalation Inhale 2 puffs into the lungs every 4 (four) hours as needed. For shortness of breath     .  ALLOPURINOL 300 MG PO TABS Oral Take 300 mg by mouth daily.     . AMIODARONE HCL 200 MG PO TABS Oral Take 200 mg by mouth daily.      . ASPIRIN 81 MG PO CHEW Oral Chew 81 mg by mouth daily.      . BUDESONIDE-FORMOTEROL FUMARATE 160-4.5 MCG/ACT IN AERO Inhalation Inhale 2 puffs into the lungs 2 (two) times daily.      Marland Kitchen CARVEDILOL 25 MG PO TABS Oral Take 0.5 tablets (12.5 mg total) by mouth 2 (two) times daily with a meal. 60 tablet 0  . HYDRALAZINE HCL 25 MG PO TABS Oral Take 1 tablet (25 mg total) by mouth 2 (two) times daily. 60 tablet 0  . LEVOTHYROXINE SODIUM 100 MCG  PO TABS Oral Take 100 mcg by mouth daily.     Marland Kitchen LORATADINE 10 MG PO TABS Oral Take 10 mg by mouth daily.      . WARFARIN SODIUM 5 MG PO TABS Oral Take 0.5 tablets (2.5 mg total) by mouth daily. 10 tablet 0    BP 129/64  Pulse 58  Resp 16  SpO2 96%  Physical Exam  Nursing note and vitals reviewed. Constitutional: He is oriented to person, place, and time. He appears well-developed and well-nourished.       Uncomfortable, NAD   HENT:  Head: Normocephalic and atraumatic.  Eyes: Conjunctivae and EOM are normal. Pupils are equal, round, and reactive to light.  Neck: Neck supple. No JVD present.  Cardiovascular: Normal rate and intact distal pulses.  Exam reveals no gallop and no friction rub.   No murmur heard. Pulmonary/Chest: Breath sounds normal. He has no wheezes. He has no rales. He exhibits no tenderness.       Lungs diminished throughout with scattered crackles. No respiratory distress noted  Abdominal: Soft. Bowel sounds are normal. He exhibits no distension. There is no tenderness. There is no rebound and no guarding.  Musculoskeletal: Normal range of motion. He exhibits edema and tenderness.       2-3+ pitting edema up to the midshin bilaterally.  Neurological: He is alert and oriented to person, place, and time. No cranial nerve deficit. Coordination normal.  Skin: Skin is warm and dry. No rash noted.  Psychiatric: He has a normal mood and affect.    ED Course  Procedures (including critical care time)  Labs Reviewed  BASIC METABOLIC PANEL - Abnormal; Notable for the following:    Glucose, Bld 102 (*)    BUN 34 (*)    Creatinine, Ser 2.47 (*)    GFR calc non Af Amer 25 (*)    GFR calc Af Amer 29 (*)    All other components within normal limits  CBC - Abnormal; Notable for the following:    RBC 4.14 (*)    Hemoglobin 11.5 (*)    HCT 37.1 (*)    All other components within normal limits  POCT I-STAT, CHEM 8 - Abnormal; Notable for the following:    BUN 37 (*)     Creatinine, Ser 2.40 (*)    Glucose, Bld 100 (*)    Calcium, Ion 1.11 (*)    Hemoglobin 12.2 (*)    HCT 36.0 (*)    All other components within normal limits  POCT I-STAT TROPONIN I  CARDIAC PANEL(CRET KIN+CKTOT+MB+TROPI)  I-STAT TROPONIN I  I-STAT, CHEM 8  PRO B NATRIURETIC PEPTIDE  PROTIME-INR  CULTURE, BLOOD (ROUTINE X 2)  CULTURE, BLOOD (ROUTINE X 2)  BLOOD GAS, ARTERIAL   Dg Chest Port 1 View  06/23/2011  *RADIOLOGY REPORT*  Clinical Data:  chest pain, shortness of breath  PORTABLE CHEST - 1 VIEW  Comparison: 06/18/2011  Findings: Left subclavian AICD / pacer noted.  Heart is enlarged with vascular congestion versus edema.  Increased left lower lobe retrocardiac atelectasis / consolidation.  Left base pneumonia not excluded.  No effusion or pneumothorax.  IMPRESSION: Cardiomegaly with vascular congestion versus mild edema.  Increased left lower lobe atelectasis / consolidation.  Original Report Authenticated By: Judie Petit. Ruel Favors, M.D.     1. CHF (congestive heart failure)   2. Healthcare-associated pneumonia       MDM  Pt is seen and examined;  Initial history and physical completed.  Will follow.       IV, O2, monitor, will continue NRB, obtain stat port CXR, lasix, bipap.    Labs, ecg.    Date: 06/23/2011  Rate: 62  Rhythm: atrial fibrillation  QRS Axis: normal  Intervals: normal  ST/T Wave abnormalities: nonspecific ST/T changes  Conduction Disutrbances:Trigeminy  Narrative Interpretation:   Old EKG Reviewed: changes noted   Results for orders placed during the hospital encounter of 06/22/11  BASIC METABOLIC PANEL      Component Value Range   Sodium 142  135 - 145 (mEq/L)   Potassium 4.5  3.5 - 5.1 (mEq/L)   Chloride 105  96 - 112 (mEq/L)   CO2 31  19 - 32 (mEq/L)   Glucose, Bld 102 (*) 70 - 99 (mg/dL)   BUN 34 (*) 6 - 23 (mg/dL)   Creatinine, Ser 1.61 (*) 0.50 - 1.35 (mg/dL)   Calcium 9.1  8.4 - 09.6 (mg/dL)   GFR calc non Af Amer 25 (*) >90  (mL/min)   GFR calc Af Amer 29 (*) >90 (mL/min)  CBC      Component Value Range   WBC 6.1  4.0 - 10.5 (K/uL)   RBC 4.14 (*) 4.22 - 5.81 (MIL/uL)   Hemoglobin 11.5 (*) 13.0 - 17.0 (g/dL)   HCT 04.5 (*) 40.9 - 52.0 (%)   MCV 89.6  78.0 - 100.0 (fL)   MCH 27.8  26.0 - 34.0 (pg)   MCHC 31.0  30.0 - 36.0 (g/dL)   RDW 81.1  91.4 - 78.2 (%)   Platelets 222  150 - 400 (K/uL)  POCT I-STAT, CHEM 8      Component Value Range   Sodium 144  135 - 145 (mEq/L)   Potassium 4.4  3.5 - 5.1 (mEq/L)   Chloride 109  96 - 112 (mEq/L)   BUN 37 (*) 6 - 23 (mg/dL)   Creatinine, Ser 9.56 (*) 0.50 - 1.35 (mg/dL)   Glucose, Bld 213 (*) 70 - 99 (mg/dL)   Calcium, Ion 0.86 (*) 1.12 - 1.32 (mmol/L)   TCO2 28  0 - 100 (mmol/L)   Hemoglobin 12.2 (*) 13.0 - 17.0 (g/dL)   HCT 57.8 (*) 46.9 - 52.0 (%)  POCT I-STAT TROPONIN I      Component Value Range   Troponin i, poc 0.00  0.00 - 0.08 (ng/mL)   Comment 3            Dg Chest 2 View  06/05/2011  *RADIOLOGY REPORT*  Clinical Data: Chest pain and shortness of breath.  CHEST - 2 VIEW  Comparison: 12/04/2010  Findings: The patient has mild cardiomegaly with slight increased pulmonary vascular congestion and small bilateral pleural effusions as well as peribronchial thickening.  I suspect this represents mild pulmonary edema. AICD in place.  IMPRESSION: Probable mild congestive heart failure with slight interstitial edema and small effusions.  Original Report Authenticated By: Gwynn Burly, M.D.   US Renal Port  02/07/2011  *RADIOLOGY REPORT*  Clinical Data: Renal insufficiency.  History of right nephrectomy. Question hydronephrosis.  PORTABLE RENAL/URINARY TRACT ULTRASOUND COMPLETE  Comparison:  Abdominal pelvic CT 03/04/2009.  Findings:  Right Kidney:  Surgically absent.  Left Kidney:  The renal sinus fat is prominent.  There is possible mild cortical thinning.  No hydronephrosis or focal cortical abnormality is identified.  Renal length is 14.3 cm.  Bladder:  Not  imaged.  The patient reportedly has a Foley catheter.  IMPRESSION:  1.  Solitary left kidney demonstrates no hydronephrosis.  There is possible mild cortical thinning, although the renal size is normal. 2.  Bladder not imaged.  Original Report Authenticated By: Gerrianne Scale, M.D.   Dg Chest Port 1 View  06/23/2011  *RADIOLOGY REPORT*  Clinical Data:  chest pain, shortness of breath  PORTABLE CHEST - 1 VIEW  Comparison: 06/18/2011  Findings: Left subclavian AICD / pacer noted.  Heart is enlarged with vascular congestion versus edema.  Increased left lower lobe retrocardiac atelectasis / consolidation.  Left base pneumonia not excluded.  No effusion or pneumothorax.  IMPRESSION: Cardiomegaly with vascular congestion versus mild edema.  Increased left lower lobe atelectasis / consolidation.  Original Report Authenticated By: Judie Petit. Ruel Favors, M.D.   Chest Portable 1 View Post Insertion To Confirm Placement As Interpreted By Radiologist  06/18/2011  *RADIOLOGY REPORT*  Clinical Data: PICC line placement.  Atrial fibrillation.  COPD. Asthma.  PORTABLE CHEST - 1 VIEW  Comparison: 06/12/2011  Findings: A right arm PICC line is seen with tip in the midportion of the right atrium.  This is estimated to be approximately 5 cm below the level of the cavoatrial junction.  AICD remains in place.  Cardiomegaly stable.  Persistent mild bibasilar atelectasis or infiltrate shows no significant interval change.  IMPRESSION:  1.  Right arm PICC line tip is in the mid right atrium, approximately 5 cm below the expected level of the cavoatrial junction. 2.  Stable cardiomegaly and bibasilar atelectasis versus infiltrates.  Original Report Authenticated By: Danae Orleans, M.D.   Dg Chest Port 1 View  06/12/2011  *RADIOLOGY REPORT*  Clinical Data: Shortness of breath, atrial fibrillation  PORTABLE CHEST - 1 VIEW  Comparison: Portable chest x-ray of 06/11/2011  Findings: Aeration has improved.  There is still patchy airspace  disease at the lung bases.  Small effusions may be present. Cardiomegaly is stable.  A pacemaker with AICD leads remains.  IMPRESSION:  1.  Improved aeration. 2.  Persistent opacity at the lung bases. 3.  Stable cardiomegaly.  Original Report Authenticated By: Juline Patch, M.D.   Dg Chest Port 1 View  06/11/2011  *RADIOLOGY REPORT*  Clinical Data: Pulmonary infiltrates.  PORTABLE CHEST - 1 VIEW  Comparison: Chest x-ray 06/10/2011.  Findings: The endotracheal tube and NG tube have been removed.  The right IJ catheter is stable.  Persistent cardiac enlargement with asymmetric bilateral airspace process, likely infiltrates. Bilateral pleural effusions persist.  No pneumothorax.  IMPRESSION:  1.  Removal of ET and NG tubes. 2.  Persistent cardiac enlargement, vascular congestion and bilateral airspace process.  Original Report Authenticated By: P. Loralie Champagne, M.D.   Dg Chest Port 1 View  06/10/2011  *RADIOLOGY REPORT*  Clinical Data: Pulmonary infiltrates.  Numbness breath.  PORTABLE CHEST - 1 VIEW  Comparison: 06/09/2011  Findings: Endotracheal tube tip is 14 mm above the carina. Central catheter tip is in the superior vena cava.  AICD in place.  Atelectasis and effusions  have slightly improved.  Cardiomegaly persists.  Slight vascular congestion.  IMPRESSION: Slight improvement in the bilateral effusions and atelectasis.  Original Report Authenticated By: Gwynn Burly, M.D.   Dg Chest Port 1 View  06/09/2011  *RADIOLOGY REPORT*  Clinical Data: Ventilator dependent respiratory failure.  Follow up CHF and effusions.  PORTABLE CHEST - 1 VIEW 06/08/2001 0732 hours:  Comparison: Portable chest x-rays yesterday and dating back to 06/06/2011.  Findings: Endotracheal tube tip in satisfactory position approximately 3-4 cm above the carina.  Right costophrenic angle excluded from the image.  Cardiac silhouette markedly enlarged but stable.  Interval slight improvement in the pulmonary venous hypertension and  pulmonary edema.  Stable bilateral pleural effusions and associated consolidation in the lower lobes.  Linear atelectasis in the left mid lung.  No new pulmonary parenchymal abnormalities.  Nasogastric courses below the diaphragm into the stomach.  Left subclavian pacing defibrillator unchanged.  Right jugular central venous catheter tip in the SVC.  IMPRESSION: Support apparatus satisfactory.  Slight improvement in the pulmonary venous hypertension and interstitial pulmonary edema. Stable bilateral pleural effusions and associated dense passive atelectasis or pneumonia in the lower lobes.  Linear atelectasis in the left mid lung.  Original Report Authenticated By: Arnell Sieving, M.D.   Dg Chest Port 1 View  06/08/2011  *RADIOLOGY REPORT*  Clinical Data: Ventilator support.  Endotracheal placement.  PORTABLE CHEST - 1 VIEW  Comparison: 2020-04-1011  Findings: Endotracheal tube has its tip to centimeters above the carina.  Pacemaker/AICD remains in place.  Nasogastric tube enters the abdomen.  Right internal jugular line has its tip in the SVC above the right atrium.  This film is not well penetrated.  Diffuse pulmonary infiltrates persist, similar allowing for technical differences.  IMPRESSION: No change.  Diffuse pulmonary infiltrates.  Today's film less well penetrated.  Original Report Authenticated By: Thomasenia Sales, M.D.   Dg Chest Port 1 View  2020-04-1011  *RADIOLOGY REPORT*  Clinical Data: Intubated  PORTABLE CHEST - 1 VIEW  Comparison: 06/06/2011  Findings: Endotracheal tube is unchanged in position. Cardiomegaly again noted.  Stable dual lead cardiac pacemaker position.  Stable right IJ central line position.  There is worsening in aeration with worsening bilateral edema or pneumonia right greater than left.  Probable bilateral small pleural effusion with bilateral basilar atelectasis or infiltrate.  IMPRESSION: Support apparatus. There is worsening in aeration with worsening bilateral edema or  pneumonia right greater than left.  Probable bilateral small pleural effusion with bilateral basilar atelectasis or infiltrate.  Original Report Authenticated By: Natasha Mead, M.D.   Dg Chest Portable 1 View  06/06/2011  *RADIOLOGY REPORT*  Clinical Data: Post intubation.  Right line placement  PORTABLE CHEST - 1 VIEW  Comparison: Chest radiograph 06/06/2011 at 10:57 hours  Findings: Endotracheal tube tip is approximately 2.5 cm above the carina.  Right IJ central venous catheter terminates over the distal superior vena cava, slightly obscured by the patient's AICD leads.  Cardiomegaly and prominent mediastinal contours are stable. There is diffuse pulmonary vascular congestion and bilateral airspace disease.  Bilateral pleural effusions are suspected. Negative for pneumothorax.  IMPRESSION:  1.  Negative for pneumothorax. 2.  Right IJ central venous catheter terminates in the distal superior vena cava. 3.  Endotracheal  tube tip is 2.5 cm above the carina. 4.  No change in probable congestive heart failure.  Original Report Authenticated By: Britta Mccreedy, M.D.   Dg Chest Portable 1 View  06/06/2011  *RADIOLOGY REPORT*  Clinical Data: Shortness of breath.  PORTABLE CHEST - 1 VIEW  Comparison: 06/05/2011 and 11/26/2010.  Findings: 1057 hours.  Current examination is limited by the low lung volumes, motion and overlap of the patient's mandible with the lung apices.  The left subclavian AICD leads appear stable.  There is progressive cardiomegaly with worsening perihilar and lower lobe air space opacities most consistent with edema.  There are probable bilateral pleural effusions.  No pneumothorax is identified.  IMPRESSION: Probable congestive heart failure with worsening edema and bilateral pleural effusions.  Aspiration pneumonia could have a similar appearance.  Original Report Authenticated By: Gerrianne Scale, M.D.   Dg Swallowing Func-no Report  06/13/2011  CLINICAL DATA: determine safest PO diet    FLUOROSCOPY FOR SWALLOWING FUNCTION STUDY:  Fluoroscopy was provided for swallowing function study, which was  administered by a speech pathologist.  Final results and recommendations  from this study are contained within the speech pathology report.      Patient is reassessed at the bedside with the pulmonary critical care position. Pulmonary will get the patient for further care. Requesting ABG, which has been ordered  CRITICAL CARE Performed by: Valma Cava A.   Total critical care time: 30 Critical care time was exclusive of separately billable procedures and treating other patients.  Critical care was necessary to treat or prevent imminent or life-threatening deterioration.  Critical care was time spent personally by me on the following activities: development of treatment plan with patient and/or surrogate as well as nursing, discussions with consultants, evaluation of patient's response to treatment, examination of patient, obtaining history from patient or surrogate, ordering and performing treatments and interventions, ordering and review of laboratory studies, ordering and review of radiographic studies, pulse oximetry and re-evaluation of patient's condition.     Pietro Bonura A. Patrica Duel, MD 06/23/11 1610

## 2011-06-23 NOTE — ED Notes (Addendum)
Pt was found on the floor,states he wastrying to void, pt has been instructed to use the call light, pt verbalized he understands. Pt has 1 inch abrasion on left elbow. CCM notifed. Continue to monitor

## 2011-06-23 NOTE — Progress Notes (Signed)
Name: Cody Faulkner MRN: [id number] DOB: September 10, 1945  LOS: 1  CRITICAL CARE follow up NOTE  History of Present Illness: 66 y/o male with chronic heart failure well known to our service from a recent hospitalization for pneumonia and septic shock presented to the [facility] ED on 1/15 with two days of leg swelling and one day of worsening shortness of breath. Of note, he was told to d/c his lasix on discharge during the most recent hospitalization.  Lines / Drains: 06/23/11 BIPAP >>  Cultures / Sepsis markers: 06/23/11 blood >> 06/23/11 urine >>  Antibiotics: 06/23/11 Vanc x1  06/23/11 Zosyn x1   Tests / Events: 06/23/11 CXR : essentially unchanged bilateral airspace disease from last CXR performed at [facility]    Vital Signs:   Filed Vitals:   06/23/11 0900 06/23/11 1000 06/23/11 1100 06/23/11 1200  BP: 141/58 143/59 110/49 126/57  Pulse: 45 47 40 44  Temp:    98.2 F (36.8 C)  TempSrc:    Axillary  Resp: 25 18 26 21   Height:      Weight:      SpO2: 96% 95% 94% 98%     Physical Examination: Gen: chronically ill appearing, resting comfortably on bipap HEENT: NCAT, PERRL, EOMi, OP clear, bipap mask in place Neck: supple without masses PULM: Insp crackles bases bilaterally, wheezes throughout CV: Tachy, regular, distant s1/s2, cannot assess JVD AB: BS+, soft, nontender, no hsm Ext: warm, massive pitting edema to knees, no clubbing, no cyanosis Derm: no rash or skin breakdown Neuro: A&Ox4, CN II-XII intact, strength 5/5 in all 4 extremities Psyche: anxious  Labs and Imaging:    Lab 06/23/11 0454 06/23/11 0110 06/23/11 0015 06/19/11 0527  NA 142 144 142 --  K 4.2 4.4 4.5 --  CL 104 109 105 --  CO2 29 -- 31 32  BUN 34* 37* 34* --  CREATININE 2.47* 2.40* 2.47* --  GLUCOSE 95 100* 102* --    Lab 06/23/11 0454 06/23/11 0110 06/23/11 0015 06/19/11 0527  HGB 11.9* 12.2* 11.5* --  HCT 37.3* 36.0* 37.1* --  WBC 6.0 -- 6.1 7.1  PLT 232 -- 222 191   PCXR: diffuse edema  1/15  Assessment and Plan:  This is a 66 y/o male with chronic heart failure (2007 LVEF 35%, 2012 55%) who presents with respiratory failure most likely due to an exacerbation of his heart failure.  No fevers or chills to suggest pneumonia and though he does have asthma he is massively volume overloaded so heart failure is the most likely diagnosis.  Suspect this has something to do with his lasix being d/c'd at the last hospitalization.  Respiratory failure, acute (06/23/2011)  Assessment: as above, due to heart failure, complicated by what appears to be underlying OSA.   Lab 06/23/11 0015  PROBNP 893.7*  Plan:  -push diuresis as tolerated -see heart failure -repeat BNP in am -check ABG now  Dilated cardiomyopathy    Assessment: acute heart failure exacerbation.  Most recent echo suggest normal LVEF, so likely diastolic dysfunction   Plan:  -continue afterload reduction with hydralazine -continue diuresis with lasix, may need infusion -consider adding back nitrates (d/c's last hospitalization) -cut b-blocker in half while decompensated  Atrial fibrillation    Assessment: s/p d/c cardioversion in most recent hospitalization. Rate controlled   Plan:  -continue amiodarone and warfarin  Encephalopathy Worried that this is d/t progressive hypercarbia.  Plan: -stat abg  Hypothyroidism    Plan:  -synthroid  Asthma   Assessment:  do not feel this is an asthma exacerbation   Plan:  -scheduled nebs and home ICS -consider solumedrol if no improvement with lasix  CAD (coronary artery disease) (11/12/2005)   Assessment: no acute evidence of ischaemia   Plan:  -home asa  -home coreg  CKD (chronic kidney disease) (06/23/2011) Recent Labs  Basename 06/23/11 0454 06/23/11 0110 06/23/11 0015   CREATININE 2.47* 2.40* 2.47*  Assessment: not in acute renal failure now   Plan:  -monitor uop -diurese as tolerated.   Hyperglycemia CBG (last 3)   Basename 06/23/11 1138 06/23/11 0810  06/23/11 0355  GLUCAP 93 97 91  plan: ssi   Code: Full  Best practices / Disposition: ICU status, PCCM service  Feeding/protein malnutrition: npo for now Analgesia: n/a Sedation: n/a Thromboprophylaxis: sup q hep HOB >30 degrees Ulcer prophylaxis: n/a Glucose control/hyperglycemia: ICU hyperglycemia protocol  Reviewed above, examined patient, and agree with assessment/plan.  Need to keep in negative fluid balance as tolerated.  Continue BPAP qhs and prn during day to hopefully avoid need for intubation.  Critical care time 35 minutes.  Coralyn Helling, MD 06/23/2011, 4:35 PM Pager:  [phone]

## 2011-06-23 NOTE — H&P (Signed)
Name: Cody Faulkner MRN: [id number] DOB: 11/12/1945  LOS: 1  CRITICAL CARE ADMISSION NOTE  History of Present Illness: 66 y/o male with chronic heart failure well known to our service from a recent hospitalization for pneumonia and septic shock presented to the [facility] ED this evening with two days of leg swelling and one day of worsening shortness of breath.  His chronic daily cough productive of clear sputum has not changed and he does not have fever or chills.  He states that he has been taking his meds, but he is supposed to be doing it on his own and seems confused about his meds.  He states that he feels better with BIPAP.  In the ED he was placed on BIPAP, given lasix, vanc, and zoysn.  He denies nausea, vomiting, diarrhea. Of note, he was told to d/c his lasix on discharge during the most recent hospitalization.  Lines / Drains: 06/23/11 BIPAP >>  Cultures / Sepsis markers: 06/23/11 blood >> 06/23/11 urine >>  Antibiotics: 06/23/11 Vanc x1  06/23/11 Zosyn x1   Tests / Events: 06/23/11 CXR : essentially unchanged bilateral airspace disease from last CXR performed at [facility]     Past Medical History  Diagnosis Date  . COPD exacerbation   . Atrial fibrillation     Cardioversion 2007  . Hypertension   . OSA (obstructive sleep apnea)     declines mask  . Depression   . Gouty arthritis   . CAD (coronary artery disease)     predominantly single vessel  . Ventricular tachycardia     s/p defibrillator-Medtronic EnTrust [id number]  . Dilated cardiomyopathy     s/p defibrillator Medtronic EnTrust [phone]  . Asthma   . 1191 Lead   . Implantable Defibrillator     Medtronic Entrust  . Chronic systolic heart failure   . Hypothyroidism   . Acute renal failure 06/06/2011  . Solitary kidney 06/05/2006  . Gout   . CHF (congestive heart failure)   . Pneumonia, community acquired     bilateral bibasilar   . Shortness of breath on exertion    Past Surgical History  Procedure Date  . Cardiac  defibrillator placement 2007    Medtronic EnTrust [phone]  . Nephrectomy 1976    Right  . Cardiac catheterization 2007    60% Stenosis mid-CFX  . Knee arthroscopy     left  . Cardioversion 06/18/2011    Procedure: CARDIOVERSION;  Surgeon: Darden Palmer., MD;  Location: [facility] OR;  Service: Cardiovascular;  Laterality: N/A;   Prior to Admission medications   Medication Sig Start Date End Date Taking? Authorizing Provider  acetaminophen (TYLENOL EX ST ARTHRITIS PAIN) 500 MG tablet Take 500 mg by mouth every 6 (six) hours as needed. For pain    Yes Historical Provider, MD  albuterol (PROVENTIL HFA;VENTOLIN HFA) 108 (90 BASE) MCG/ACT inhaler Inhale 2 puffs into the lungs every 4 (four) hours as needed. For shortness of breath    Yes Historical Provider, MD  allopurinol (ZYLOPRIM) 300 MG tablet Take 300 mg by mouth daily.    Yes Historical Provider, MD  amiodarone (PACERONE) 200 MG tablet Take 200 mg by mouth daily.     Yes Historical Provider, MD  aspirin 81 MG chewable tablet Chew 81 mg by mouth daily.     Yes Historical Provider, MD  budesonide-formoterol (SYMBICORT) 160-4.5 MCG/ACT inhaler Inhale 2 puffs into the lungs 2 (two) times daily.     Yes Historical Provider, MD  carvedilol (  COREG) 25 MG tablet Take 0.5 tablets (12.5 mg total) by mouth 2 (two) times daily with a meal. 06/19/11  Yes Pleas Koch, MD  hydrALAZINE (APRESOLINE) 25 MG tablet Take 1 tablet (25 mg total) by mouth 2 (two) times daily. 06/19/11 06/18/12 Yes Pleas Koch, MD  levothyroxine (SYNTHROID, LEVOTHROID) 100 MCG tablet Take 100 mcg by mouth daily.    Yes Historical Provider, MD  loratadine (CLARITIN) 10 MG tablet Take 10 mg by mouth daily.     Yes Historical Provider, MD  warfarin (COUMADIN) 5 MG tablet Take 0.5 tablets (2.5 mg total) by mouth daily. 06/19/11  Yes Pleas Koch, MD   No Known Allergies Family History  Problem Relation Age of Onset  . Heart disease Other     uncle  . Diabetes Other     uncle   Social  History  reports that he has never smoked. He has never used smokeless tobacco. He reports that he drinks alcohol. He reports that he does not use illicit drugs.  Review Of Systems   Gen: Denies fever, chills, weight change, fatigue, night sweats HEENT: Denies blurred vision, double vision, hearing loss, tinnitus, sinus congestion, rhinorrhea, sore throat, neck stiffness, dysphagia PULM: Notes shortness of breath and wheezing, per HPI CV: Denies chest pain, notes significant edema, orthopnea, paroxysmal nocturnal dyspnea, palpitations GI: Denies abdominal pain, nausea, vomiting, diarrhea, hematochezia, melena, constipation, change in bowel habits GU: Denies dysuria, hematuria, polyuria, oliguria, urethral discharge Endocrine: Denies hot or cold intolerance, polyuria, polyphagia or appetite change Derm: Denies rash, dry skin, scaling or peeling skin change Heme: Denies easy bruising, bleeding, bleeding gums Neuro: Denies headache, numbness, weakness, slurred speech, loss of memory or consciousness  Vital Signs:   Filed Vitals:   06/23/11 0045 06/23/11 0100 06/23/11 0130 06/23/11 0145  BP:  129/64 141/75 137/78  Pulse: 57 58 59 56  Resp: 25 16 31 24   SpO2: 95% 96% 96% 96%     Physical Examination: Gen: chronically ill appearing, resting comfortably on bipap HEENT: NCAT, PERRL, EOMi, OP clear, bipap mask in place Neck: supple without masses PULM: Insp crackles bases bilaterally, wheezes throughout CV: Tachy, regular, distant s1/s2, cannot assess JVD AB: BS+, soft, nontender, no hsm Ext: warm, massive pitting edema to knees, no clubbing, no cyanosis Derm: no rash or skin breakdown Neuro: A&Ox4, CN II-XII intact, strength 5/5 in all 4 extremities Psyche: anxious  Labs and Imaging:   CBC    Component Value Date/Time   WBC 6.1 06/23/2011 0015   RBC 4.14* 06/23/2011 0015   HGB 12.2* 06/23/2011 0110   HCT 36.0* 06/23/2011 0110   PLT 222 06/23/2011 0015   MCV 89.6 06/23/2011 0015   MCH  27.8 06/23/2011 0015   MCHC 31.0 06/23/2011 0015   RDW 13.9 06/23/2011 0015   LYMPHSABS 0.6* 06/12/2011 0415   MONOABS 1.5* 06/12/2011 0415   EOSABS 0.1 06/12/2011 0415   BASOSABS 0.0 06/12/2011 0415    BMET    Component Value Date/Time   NA 144 06/23/2011 0110   K 4.4 06/23/2011 0110   CL 109 06/23/2011 0110   CO2 31 06/23/2011 0015   GLUCOSE 100* 06/23/2011 0110   BUN 37* 06/23/2011 0110   CREATININE 2.40* 06/23/2011 0110   CALCIUM 9.1 06/23/2011 0015   GFRNONAA 25* 06/23/2011 0015   GFRAA 29* 06/23/2011 0015     Assessment and Plan:  This is a 66 y/o male with chronic heart failure (2007 LVEF 35%, 2012 55%) who presents with respiratory failure  most likely due to an exacerbation of his heart failure.  No fevers or chills to suggest pneumonia and though he does have asthma he is massively volume overloaded so heart failure is the most likely diagnosis.  Suspect this has something to do with his lasix being d/c'd at the last hospitalization.  Respiratory failure, acute (06/23/2011)   Assessment: as above, due to heart failure   Plan:  -diurese (second dose of lasix given in ED for total dose of 120mg ) -if bolus lasix doesn't work then he needs an IV drip -d/c antibiotics -see heart failure  Atrial fibrillation ()   Assessment: s/p d/c cardioversion in most recent hospitalization   Plan:  -continue amiodarone and warfarin  Dilated cardiomyopathy ()   Assessment: acute heart failure exacerbation.  Most recent echo suggest normal LVEF, so likely diastolic dysfunction   Plan:  -continue afterload reduction with hydralazine -continue diuresis with lasix, may need infusion -consider metolazone  -consider adding back nitrates (d/c's last hospitalization) -cut b-blocker in half while decompensated  Hypothyroidism ()   Assessment:    Plan:  -synthroid  Asthma ()   Assessment: do not feel this is an asthma exacerbation   Plan:  -scheduled nebs and home ICS -consider solumedrol if no  improvement with lasix  CAD (coronary artery disease) (11/12/2005)   Assessment: no acute evidence of ischaemia   Plan:  -home asa  -home coreg  CKD (chronic kidney disease) (06/23/2011)   Assessment: not in acute renal failure now   Plan:  -monitor uop -diurese  Code: Full  Best practices / Disposition: ICU status, PCCM service  Feeding/protein malnutrition: npo for now Analgesia: n/a Sedation: n/a Thromboprophylaxis: sup q hep HOB >30 degrees Ulcer prophylaxis: n/a Glucose control/hyperglycemia: ICU hyperglycemia protocol  The patient is critically ill with multiple organ systems failure and requires high complexity decision making for assessment and support, frequent evaluation and titration of therapies, application of advanced monitoring technologies and extensive interpretation of multiple databases. Critical Care Time devoted to patient care services described in this note is 60 minutes.  Heber Doffing, M.D. Pulmonary and Critical Care Medicine [facility] Pager: [phone]  06/23/2011, 2:38 AM

## 2011-06-23 NOTE — Progress Notes (Signed)
UR completed 

## 2011-06-24 ENCOUNTER — Inpatient Hospital Stay (HOSPITAL_COMMUNITY): Payer: Medicare Other

## 2011-06-24 DIAGNOSIS — N189 Chronic kidney disease, unspecified: Secondary | ICD-10-CM

## 2011-06-24 DIAGNOSIS — R0902 Hypoxemia: Secondary | ICD-10-CM

## 2011-06-24 DIAGNOSIS — I5031 Acute diastolic (congestive) heart failure: Secondary | ICD-10-CM

## 2011-06-24 DIAGNOSIS — J9600 Acute respiratory failure, unspecified whether with hypoxia or hypercapnia: Secondary | ICD-10-CM

## 2011-06-24 LAB — GLUCOSE, CAPILLARY
Glucose-Capillary: 76 mg/dL (ref 70–99)
Glucose-Capillary: 90 mg/dL (ref 70–99)
Glucose-Capillary: 119 mg/dL — ABNORMAL HIGH (ref 70–99)
Glucose-Capillary: 96 mg/dL (ref 70–99)

## 2011-06-24 LAB — PROCALCITONIN: Procalcitonin: <0.1 ng/mL

## 2011-06-24 LAB — CBC
HCT: 36.3 % — ABNORMAL LOW (ref 39.0–52.0)
Hemoglobin: 11.4 g/dL — ABNORMAL LOW (ref 13.0–17.0)
MCH: 27.9 pg (ref 26.0–34.0)
MCHC: 31.4 g/dL (ref 30.0–36.0)
MCV: 88.8 fL (ref 78.0–100.0)
RBC: 4.09 MIL/uL — ABNORMAL LOW (ref 4.22–5.81)

## 2011-06-24 LAB — COMPREHENSIVE METABOLIC PANEL
Albumin: 3.1 g/dL — ABNORMAL LOW (ref 3.5–5.2)
Alkaline Phosphatase: 54 U/L (ref 39–117)
BUN: 37 mg/dL — ABNORMAL HIGH (ref 6–23)
CO2: 33 mEq/L — ABNORMAL HIGH (ref 19–32)
Chloride: 102 mEq/L (ref 96–112)
Creatinine, Ser: 2.81 mg/dL — ABNORMAL HIGH (ref 0.50–1.35)
GFR calc non Af Amer: 22 mL/min — ABNORMAL LOW (ref >90)
Potassium: 3.8 mEq/L (ref 3.5–5.1)
Total Bilirubin: 1 mg/dL (ref 0.3–1.2)

## 2011-06-24 LAB — PROTIME-INR: Prothrombin Time: 31.4 seconds — ABNORMAL HIGH (ref 11.6–15.2)

## 2011-06-24 MED ORDER — FUROSEMIDE 40 MG PO TABS
40.0000 mg | ORAL_TABLET | Freq: Two times a day (BID) | ORAL | Status: DC
  Administered 2011-06-24 – 2011-06-25 (×3): 40 mg via ORAL
  Filled 2011-06-24 (×4): qty 1

## 2011-06-24 MED ORDER — IPRATROPIUM BROMIDE 0.02 % IN SOLN
0.5000 mg | Freq: Four times a day (QID) | RESPIRATORY_TRACT | Status: DC
  Administered 2011-06-24 – 2011-06-28 (×15): 0.5 mg via RESPIRATORY_TRACT
  Filled 2011-06-24 (×14): qty 2.5

## 2011-06-24 MED ORDER — WARFARIN SODIUM 2 MG PO TABS
2.0000 mg | ORAL_TABLET | Freq: Once | ORAL | Status: AC
  Administered 2011-06-24: 2 mg via ORAL
  Filled 2011-06-24: qty 1

## 2011-06-24 MED ORDER — INSULIN ASPART 100 UNIT/ML ~~LOC~~ SOLN
0.0000 [IU] | Freq: Three times a day (TID) | SUBCUTANEOUS | Status: DC
  Administered 2011-06-28 – 2011-07-01 (×4): 3 [IU] via SUBCUTANEOUS
  Administered 2011-07-02: 4 [IU] via SUBCUTANEOUS
  Administered 2011-07-02: 3 [IU] via SUBCUTANEOUS
  Administered 2011-07-02: 4 [IU] via SUBCUTANEOUS
  Administered 2011-07-03 – 2011-07-04 (×5): 3 [IU] via SUBCUTANEOUS
  Administered 2011-07-05: 4 [IU] via SUBCUTANEOUS
  Administered 2011-07-05 – 2011-07-06 (×2): 3 [IU] via SUBCUTANEOUS
  Filled 2011-06-24: qty 3

## 2011-06-24 MED ORDER — ALBUTEROL SULFATE (5 MG/ML) 0.5% IN NEBU
2.5000 mg | INHALATION_SOLUTION | Freq: Four times a day (QID) | RESPIRATORY_TRACT | Status: DC
  Administered 2011-06-24 – 2011-06-28 (×15): 2.5 mg via RESPIRATORY_TRACT
  Filled 2011-06-24 (×14): qty 0.5

## 2011-06-24 MED ORDER — ASPIRIN EC 81 MG PO TBEC
81.0000 mg | DELAYED_RELEASE_TABLET | Freq: Every day | ORAL | Status: DC
  Administered 2011-06-24 – 2011-07-06 (×13): 81 mg via ORAL
  Filled 2011-06-24 (×14): qty 1

## 2011-06-24 NOTE — Progress Notes (Signed)
Name: Cody Faulkner MRN: [id number] DOB: [birth year]  LOS: 2  CRITICAL CARE follow up NOTE  History of Present Illness: 66 y/o male with chronic heart failure well known to our service from a recent hospitalization for pneumonia and septic shock presented to the [facility] ED on 1/15 with two days of leg swelling and one day of worsening shortness of breath. Of note, he was told to d/c his lasix on discharge during the most recent hospitalization.  Lines / Drains: 06/23/11 BIPAP >>  Cultures / Sepsis markers: 06/23/11 blood >> 06/23/11 urine >>  Antibiotics: 06/23/11 Vanc x1  06/23/11 Zosyn x1   Tests / Events:  Subjective: Breathing better.  Denies chest pain.  More alert.  Feels hungry.    Vital Signs:   Filed Vitals:   06/24/11 0400 06/24/11 0500 06/24/11 0600 06/24/11 0700  BP: 100/54 109/62 107/53 114/64  Pulse: 60 64 63 60  Temp: 98.6 F (37 C)     TempSrc: Oral     Resp: 23 19 24 25   Height:      Weight:  259 lb 4.2 oz (117.6 kg)    SpO2: 92% 92% 93% 92%     Physical Examination: Gen: No distress HEENT: mucosa moist PULM: basilar crackles, with faint b/l exp wheeze CV: s1s2 regular, no murmur AB: soft, nontender, +BS Ext: venous stasis changes, 1+ pitting edema Neuro: Alert, follows commands, moves all extremities Psych: normal mood, behavior  Labs and Imaging:    Lab 06/24/11 0315 06/23/11 0454 06/23/11 0110 06/23/11 0015  NA 145 142 144 --  K 3.8 4.2 4.4 --  CL 102 104 109 --  CO2 33* 29 -- 31  BUN 37* 34* 37* --  CREATININE 2.81* 2.47* 2.40* --  GLUCOSE 81 95 100* --    Lab 06/24/11 0315 06/23/11 0454 06/23/11 0110 06/23/11 0015  HGB 11.4* 11.9* 12.2* --  HCT 36.3* 37.3* 36.0* --  WBC 6.1 6.0 -- 6.1  PLT 228 232 -- 222   Portable Chest Xray In Am  06/24/2011  *RADIOLOGY REPORT*  Clinical Data: Pulmonary edema.  COPD exacerbation.  PORTABLE CHEST - 1 VIEW  Comparison: 06/23/2011  Findings: Minimal motion degradation.  Pacer / AICD device with leads right  atrium and right ventricle.  The chin overlies the apices.  Mild cardiomegaly.  Layering small left and probable small right pleural effusions. No pneumothorax. Interstitial edema is moderate and slightly progressive.  IMPRESSION:  1.  Increased interstitial edema with persistent left pleural effusion and adjacent airspace disease. 2.  Minimally motion degraded exam.  Original Report Authenticated By: Consuello Bossier, M.D.   Dg Chest Port 1 View  06/23/2011  *RADIOLOGY REPORT*  Clinical Data: Cough, shortness of breath, pulmonary edema, follow- up  PORTABLE CHEST - 1 VIEW  Comparison: Portable exam 1426 hours repeated at 1427 hours compared to earlier exam of 0030 hours  Findings: Left subclavian AICD leads project over right atrium and right ventricle. Enlargement of cardiac silhouette. Pulmonary vascular congestion. Atelectasis versus consolidation in left lower lobe. Mild hazy perihilar infiltrates question edema. No definite pleural effusion or pneumothorax. Scattered end plate spur formation thoracic spine.  IMPRESSION: Enlargement of cardiac silhouette with pulmonary vascular congestion post AICD. Minimal perihilar edema.  Original Report Authenticated By: Lollie Marrow, M.D.   Dg Chest Port 1 View  06/23/2011  *RADIOLOGY REPORT*  Clinical Data:  chest pain, shortness of breath  PORTABLE CHEST - 1 VIEW  Comparison: 06/18/2011  Findings: Left subclavian AICD /  pacer noted.  Heart is enlarged with vascular congestion versus edema.  Increased left lower lobe retrocardiac atelectasis / consolidation.  Left base pneumonia not excluded.  No effusion or pneumothorax.  IMPRESSION: Cardiomegaly with vascular congestion versus mild edema.  Increased left lower lobe atelectasis / consolidation.  Original Report Authenticated By: Judie Petit. Ruel Favors, M.D.     Assessment and Plan:  This is a 66 y/o male with chronic heart failure (2007 LVEF 35%, 2012 55%) who presents with respiratory failure most likely due to an  exacerbation of his heart failure.  No fevers or chills to suggest pneumonia and though he does have asthma he is massively volume overloaded so heart failure is the most likely diagnosis.  Suspect this has something to do with his lasix being d/c'd at the last hospitalization.  Respiratory failure, acute (06/23/2011) Assessment: as above, due to heart failure, complicated by what appears to be underlying OSA.   Lab 06/24/11 0315 06/23/11 0015  PROBNP 594.1* 893.7*  Plan:  -push diuresis as tolerated -BPAP qhs and prn during day  Acute on chronic diastolic heart failure. Assessment: acute heart failure exacerbation.  Most recent echo suggest normal LVEF, so likely diastolic dysfunction   Plan:  -continue afterload reduction with hydralazine -continue diuresis with lasix>>may be limited by renal function -continue carvedilol  Atrial fibrillation  Assessment: s/p d/c cardioversion in most recent hospitalization. Rate controlled   Plan:  -continue amiodarone and warfarin  Encephalopathy 2nd to hypercapnia.  Improved 1/16. Plan: -Monitor clinically  Hypothyroidism    Plan:  -continue synthroid  Asthma   Assessment: do not feel this is an asthma exacerbation   Plan:  -scheduled nebs and home ICS  CAD (coronary artery disease) (11/12/2005)   Assessment: no acute evidence of ischemia   Plan:  -continue home asa, coreg  CKD (chronic kidney disease) (06/23/2011) Recent Labs  Basename 06/24/11 0315 06/23/11 0454 06/23/11 0110   CREATININE 2.81* 2.47* 2.40*  Assessment: not in acute renal failure now   Plan:  -monitor uop -diurese as tolerated.   Hyperglycemia CBG (last 3)   Basename 06/24/11 0345 06/23/11 2336 06/23/11 1935  GLUCAP 76 89 95  plan: ssi  History of gout Plan: -continue allopurinol  Code: Full  Best practices / Disposition: ICU status, PCCM service  Feeding/protein malnutrition: Modified carbohydrate Thromboprophylaxis: Coumadin HOB >30  degrees Ulcer prophylaxis: n/a Glucose control/hyperglycemia: SSI  Coralyn Helling, MD 06/24/2011, 7:47 AM Pager:  [phone]

## 2011-06-24 NOTE — Progress Notes (Signed)
ANTICOAGULATION CONSULT NOTE - Initial Consult  Pharmacy Consult for Warfarin Indication: atrial fibrillation  No Known Allergies  Patient Measurements: Height: 5\' 4"  (162.6 cm) Weight: 259 lb 4.2 oz (117.6 kg) IBW/kg (Calculated) : 59.2   Labs:  Basename 06/24/11 0315 06/23/11 1554 06/23/11 0908 06/23/11 0454 06/23/11 0110 06/23/11 0015  HGB 11.4* -- -- 11.9* -- --  HCT 36.3* -- -- 37.3* 36.0* --  PLT 228 -- -- 232 -- 222  APTT -- -- -- -- -- --  LABPROT 31.4* -- -- -- -- 26.1*  INR 2.97* -- -- -- -- 2.35*  HEPARINUNFRC -- -- -- -- -- --  CREATININE 2.81* -- -- 2.47* 2.40* --  CKTOTAL -- 24 34 -- -- 38  CKMB -- 1.6 1.8 -- -- 2.0  TROPONINI -- <0.30 <0.30 -- -- <0.30   Estimated Creatinine Clearance: 29.8 ml/min (by C-G formula based on Cr of 2.81).  Medical History: Past Medical History  Diagnosis Date  . COPD exacerbation   . Atrial fibrillation     Cardioversion 2007  . Hypertension   . OSA (obstructive sleep apnea)     declines mask  . Depression   . Gouty arthritis   . CAD (coronary artery disease)     predominantly single vessel  . Ventricular tachycardia     s/p defibrillator-Medtronic EnTrust D154  . Dilated cardiomyopathy     s/p defibrillator Medtronic EnTrust [phone]  . Asthma   . 9604 Lead   . Implantable Defibrillator     Medtronic Entrust  . Chronic systolic heart failure   . Hypothyroidism   . Acute renal failure 06/06/2011  . Solitary kidney 06/05/2006  . Gout   . CHF (congestive heart failure)   . Pneumonia, community acquired     bilateral bibasilar   . Shortness of breath on exertion     Medications:  Scheduled:    . albuterol  2.5 mg Nebulization Q6H   And  . ipratropium  0.5 mg Nebulization Q6H  . allopurinol  300 mg Oral Daily  . amiodarone  200 mg Oral Daily  . antiseptic oral rinse  15 mL Mouth Rinse q12n4p  . aspirin  324 mg Oral NOW   Or  . aspirin  300 mg Rectal NOW  . aspirin EC  81 mg Oral Daily  .  budesonide-formoterol  2 puff Inhalation BID  . carvedilol  6.25 mg Oral BID WC  . chlorhexidine  15 mL Mouth Rinse BID  . furosemide  80 mg Intravenous Q6H  . furosemide  40 mg Oral BID  . hydrALAZINE  25 mg Oral BID  . insulin aspart  0-20 Units Subcutaneous TID WC  . levothyroxine  100 mcg Oral QAC breakfast  . warfarin  2.5 mg Oral q1800  . DISCONTD: albuterol  2.5 mg Nebulization Q4H  . DISCONTD: heparin  5,000 Units Subcutaneous Q8H  . DISCONTD: insulin aspart  0-7 Units Subcutaneous Q4H  . DISCONTD: ipratropium  0.5 mg Nebulization Q4H   Infusions:    . DISCONTD: dextrose     Assessment: 65YOM on coumadin for chronic atrial fibrillation.  INR therapeutic on admission on home dose of 2.5mg  po daily. No bleeding/complications reported.  No PO intake recorded which may result in increased warfarin sensitivity.  Goal of Therapy:  INR 2-3   Plan:  Warfarin 2mg  PO once today. Follow up INR in AM.   Clance Boll 06/24/2011,8:43 AM

## 2011-06-24 NOTE — Progress Notes (Signed)
2245-Pt placed on Aouto-Bipap via FFM with 3 LPM O2 bleed in.  Pt tolerating well at this time, RT to monitor and assess as needed

## 2011-06-25 ENCOUNTER — Inpatient Hospital Stay (HOSPITAL_COMMUNITY): Payer: Medicare Other

## 2011-06-25 DIAGNOSIS — R0902 Hypoxemia: Secondary | ICD-10-CM

## 2011-06-25 DIAGNOSIS — J9600 Acute respiratory failure, unspecified whether with hypoxia or hypercapnia: Secondary | ICD-10-CM

## 2011-06-25 DIAGNOSIS — I5031 Acute diastolic (congestive) heart failure: Secondary | ICD-10-CM

## 2011-06-25 DIAGNOSIS — N189 Chronic kidney disease, unspecified: Secondary | ICD-10-CM

## 2011-06-25 LAB — PROTIME-INR
INR: 2.63 — ABNORMAL HIGH (ref 0.00–1.49)
Prothrombin Time: 28.5 seconds — ABNORMAL HIGH (ref 11.6–15.2)

## 2011-06-25 LAB — CBC
HCT: 35.4 % — ABNORMAL LOW (ref 39.0–52.0)
Hemoglobin: 11.4 g/dL — ABNORMAL LOW (ref 13.0–17.0)
MCH: 28.1 pg (ref 26.0–34.0)
RBC: 4.06 MIL/uL — ABNORMAL LOW (ref 4.22–5.81)

## 2011-06-25 LAB — GLUCOSE, CAPILLARY
Glucose-Capillary: 96 mg/dL (ref 70–99)
Glucose-Capillary: 84 mg/dL (ref 70–99)

## 2011-06-25 LAB — BASIC METABOLIC PANEL
BUN: 46 mg/dL — ABNORMAL HIGH (ref 6–23)
Chloride: 100 mEq/L (ref 96–112)
GFR calc Af Amer: 23 mL/min — ABNORMAL LOW (ref >90)
GFR calc non Af Amer: 20 mL/min — ABNORMAL LOW (ref >90)
Glucose, Bld: 89 mg/dL (ref 70–99)
Potassium: 3.6 mEq/L (ref 3.5–5.1)
Sodium: 142 mEq/L (ref 135–145)

## 2011-06-25 MED ORDER — WARFARIN SODIUM 1 MG PO TABS
1.0000 mg | ORAL_TABLET | Freq: Once | ORAL | Status: AC
  Administered 2011-06-25: 1 mg via ORAL
  Filled 2011-06-25: qty 1

## 2011-06-25 NOTE — Progress Notes (Signed)
CSW met with Pt as he is admitted from SNF. Pt from [facility] and plans to return at d/c. Per [facility], they can accept admissions over the weekend. Contact there is Lawson Fiscal [phone]. Pt's cousin is his next of kin, Thurmond Butts.  CSW to update FL2 and follow for return to SNF. Pt will need PT eval to assist with return to SNF.  Vennie Homans, [place] 06/25/2011 10:18 AM [phone]

## 2011-06-25 NOTE — Progress Notes (Signed)
ANTICOAGULATION CONSULT NOTE - Follow-Up Consult  Pharmacy Consult for Warfarin Indication: atrial fibrillation  No Known Allergies  Patient Measurements: Height: 5\' 4"  (162.6 cm) Weight: 258 lb 9.6 oz (117.3 kg) IBW/kg (Calculated) : 59.2   Labs:  Basename 06/25/11 0305 06/24/11 0315 06/23/11 1554 06/23/11 0908 06/23/11 0454 06/23/11 0015  HGB 11.4* 11.4* -- -- -- --  HCT 35.4* 36.3* -- -- 37.3* --  PLT 230 228 -- -- 232 --  APTT -- -- -- -- -- --  LABPROT 28.5* 31.4* -- -- -- 26.1*  INR 2.63* 2.97* -- -- -- 2.35*  HEPARINUNFRC -- -- -- -- -- --  CREATININE 2.99* 2.81* -- -- 2.47* --  CKTOTAL -- -- 24 34 -- 38  CKMB -- -- 1.6 1.8 -- 2.0  TROPONINI -- -- <0.30 <0.30 -- <0.30   Estimated Creatinine Clearance: 27.9 ml/min (by C-G formula based on Cr of 2.99).  Medical History: Past Medical History  Diagnosis Date  . COPD exacerbation   . Atrial fibrillation     Cardioversion 2007  . Hypertension   . OSA (obstructive sleep apnea)     declines mask  . Depression   . Gouty arthritis   . CAD (coronary artery disease)     predominantly single vessel  . Ventricular tachycardia     s/p defibrillator-Medtronic EnTrust D154  . Dilated cardiomyopathy     s/p defibrillator Medtronic EnTrust [phone]  . Asthma   . 9604 Lead   . Implantable Defibrillator     Medtronic Entrust  . Chronic systolic heart failure   . Hypothyroidism   . Acute renal failure 06/06/2011  . Solitary kidney 06/05/2006  . Gout   . CHF (congestive heart failure)   . Pneumonia, community acquired     bilateral bibasilar   . Shortness of breath on exertion     Medications:  Scheduled:     . albuterol  2.5 mg Nebulization Q6H   And  . ipratropium  0.5 mg Nebulization Q6H  . allopurinol  300 mg Oral Daily  . amiodarone  200 mg Oral Daily  . antiseptic oral rinse  15 mL Mouth Rinse q12n4p  . aspirin EC  81 mg Oral Daily  . budesonide-formoterol  2 puff Inhalation BID  . carvedilol  6.25 mg Oral  BID WC  . chlorhexidine  15 mL Mouth Rinse BID  . hydrALAZINE  25 mg Oral BID  . insulin aspart  0-20 Units Subcutaneous TID WC  . levothyroxine  100 mcg Oral QAC breakfast  . warfarin  2 mg Oral ONCE-1800  . DISCONTD: furosemide  40 mg Oral BID   Patient's usual warfarin dosage is reportedly 2.5mg  daily Inpatient warfarin doses this admission:  2.5mg , 2mg .    Diet: Dysphagia I   Assessment:  66 y/o M on chronic warfarin for atrial fibrillation  INR therapeutic but rising  Amiodarone noted (typically increases INR on warfarin) - continued from prior to admission.   Goal INR:  2-3   Plan:  1) Warfarin 1mg  PO today 2) PT/INR daily while inpatient  Elie Goody, Pharm.D.  [phone] 06/25/2011 10:48 AM

## 2011-06-25 NOTE — Progress Notes (Signed)
Name: Cody Faulkner MRN: [id number] DOB: 02-27-1946  LOS: 3  CRITICAL CARE follow up NOTE  History of Present Illness: 66 y/o male with chronic heart failure well known to our service from a recent hospitalization for pneumonia and septic shock presented to the [facility] ED on 1/15 with two days of leg swelling and one day of worsening shortness of breath. Of note, he was told to d/c his lasix on discharge during the most recent hospitalization.  Lines / Drains: 06/23/11 BIPAP >>  Cultures / Sepsis markers: 06/23/11 blood >> 06/23/11 urine >>  Antibiotics: 06/23/11 Vanc x1  06/23/11 Zosyn x1   Tests / Events:  Subjective: Breathing better.  Denies chest pain.  More alert.  Feels stronger.    Vital Signs:   Filed Vitals:   06/25/11 0400 06/25/11 0500 06/25/11 0700 06/25/11 0800  BP: 119/52 114/62 121/79 130/79  Pulse: 64 55 58 61  Temp: 98.4 F (36.9 C)   98.7 F (37.1 C)  TempSrc: Oral   Oral  Resp: 22 23 22 22   Height:      Weight:  117.3 kg (258 lb 9.6 oz)    SpO2: 91% 92% 94% 91%     Physical Examination: Gen: No distress HEENT: mucosa moist, voice is hoarse PULM: Diffuse wheeze, scattered rhonchi CV: s1s2 regular, no murmur AB: soft, nontender, +BS Ext: venous stasis changes, 1+ pitting edema Neuro: Alert, follows commands, moves all extremities Psych: normal mood, behavior  Labs and Imaging:    Lab 06/25/11 0305 06/24/11 0315 06/23/11 0454  NA 142 145 142  K 3.6 3.8 4.2  CL 100 102 104  CO2 30 33* 29  BUN 46* 37* 34*  CREATININE 2.99* 2.81* 2.47*  GLUCOSE 89 81 95    Lab 06/25/11 0305 06/24/11 0315 06/23/11 0454  HGB 11.4* 11.4* 11.9*  HCT 35.4* 36.3* 37.3*  WBC 6.0 6.1 6.0  PLT 230 228 232    06/25/2011  CXR:   1. Mildly limited exam, secondary to motion. 2.  Given this factor, no significant change in interstitial edema. 3.  Small left pleural effusion with left greater than right bibasilar airspace opacities.    Assessment and Plan:  This is a 66 y/o  male with chronic heart failure (2007 LVEF 35%, 2012 55%) who presents with respiratory failure most likely due to an exacerbation of his heart failure.  No fevers or chills to suggest pneumonia and though he does have asthma he is massively volume overloaded so heart failure is the most likely diagnosis.  Suspect this has something to do with his lasix being d/c'd at the last hospitalization.  Respiratory failure, acute (06/23/2011) Assessment: as above, due to heart failure, complicated by what appears to be underlying OSA/ohs.   Lab 06/24/11 0315 06/23/11 0015  PROBNP 594.1* 893.7*  Plan:  -keep even at this point -BPAP qhs, Will order AutoSet BiPAP, think he'll need to go back to the nursing home on this  Acute on chronic diastolic heart failure. Assessment: acute heart failure exacerbation.  Most recent echo suggest normal LVEF, so likely diastolic dysfunction   Plan:  -continue afterload reduction with hydralazine -keep even, we need to identify optimal diuretic regimen at this point -continue carvedilol  Atrial fibrillation  Assessment: s/p d/c cardioversion in most recent hospitalization. Rate controlled Lab Results  Component Value Date   INR 2.63* 06/25/2011   INR 2.97* 06/24/2011   INR 2.35* 06/23/2011   Plan:  -continue amiodarone and warfarin dose per pharmacy  Hypothyroidism    Plan:  -continue synthroid  Asthma   Assessment: do not feel this is an asthma exacerbation   Plan:  -scheduled nebs and home ICS  CAD (coronary artery disease) (11/12/2005)   Assessment: no acute evidence of ischemia   Plan:  -continue home asa, coreg  CKD (chronic kidney disease) (06/23/2011) Recent Labs  Basename 06/25/11 0305 06/24/11 0315 06/23/11 0454   CREATININE 2.99* 2.81* 2.47*  Assessment: creatinine trending up with diuretics   Plan:  -monitor uop -back off on diuretics today   Hyperglycemia CBG (last 3)   Basename 06/25/11 0742 06/24/11 2144 06/24/11 1727  GLUCAP 89 96  98  plan: ssi  History of gout Plan: -continue allopurinol  Dysphasia Plan -Continue a dysphagia 1 diet  Physical debilitation Plan -PTOT consult  Encephalopathy 2nd to hypercapnia.  Resolved  Code: Full  Best practices / Disposition:  Step-down status, PCCM service Feeding/protein malnutrition: Modified carbohydrate Thromboprophylaxis: Coumadin HOB >30 degrees Ulcer prophylaxis: n/a Glucose control/hyperglycemia: SSI  Reviewed above, examined patient and agree with assessment/plan.  He is improving.  Will need to find optimal dose of diuretic over next few days while in hospital.    Coralyn Helling, MD 06/25/2011, 2:22 PM Pager:  [phone]

## 2011-06-26 ENCOUNTER — Inpatient Hospital Stay (HOSPITAL_COMMUNITY): Payer: Medicare Other

## 2011-06-26 LAB — CBC
Hemoglobin: 10.7 g/dL — ABNORMAL LOW (ref 13.0–17.0)
MCH: 27.6 pg (ref 26.0–34.0)
MCHC: 31.8 g/dL (ref 30.0–36.0)
Platelets: 234 10*3/uL (ref 150–400)
RDW: 14.2 % (ref 11.5–15.5)

## 2011-06-26 LAB — BASIC METABOLIC PANEL
Calcium: 8.5 mg/dL (ref 8.4–10.5)
GFR calc Af Amer: 28 mL/min — ABNORMAL LOW (ref >90)
GFR calc non Af Amer: 24 mL/min — ABNORMAL LOW (ref >90)
Glucose, Bld: 93 mg/dL (ref 70–99)
Sodium: 139 mEq/L (ref 135–145)

## 2011-06-26 LAB — PROTIME-INR
INR: 2.14 — ABNORMAL HIGH (ref 0.00–1.49)
Prothrombin Time: 24.3 seconds — ABNORMAL HIGH (ref 11.6–15.2)

## 2011-06-26 LAB — GLUCOSE, CAPILLARY: Glucose-Capillary: 91 mg/dL (ref 70–99)

## 2011-06-26 LAB — BLOOD GAS, ARTERIAL
Acid-Base Excess: 5.7 mmol/L — ABNORMAL HIGH (ref 0.0–2.0)
O2 Content: 5 L/min
O2 Saturation: 94.2 %
TCO2: 28.5 mmol/L (ref 0–100)
pCO2 arterial: 52.1 mmHg — ABNORMAL HIGH (ref 35.0–45.0)

## 2011-06-26 MED ORDER — FUROSEMIDE 10 MG/ML IJ SOLN
40.0000 mg | Freq: Once | INTRAMUSCULAR | Status: AC
  Administered 2011-06-26: 40 mg via INTRAVENOUS
  Filled 2011-06-26: qty 4

## 2011-06-26 MED ORDER — POTASSIUM CHLORIDE CRYS ER 20 MEQ PO TBCR
40.0000 meq | EXTENDED_RELEASE_TABLET | Freq: Once | ORAL | Status: AC
  Administered 2011-06-26: 40 meq via ORAL
  Filled 2011-06-26: qty 2

## 2011-06-26 MED ORDER — SENNOSIDES-DOCUSATE SODIUM 8.6-50 MG PO TABS
1.0000 | ORAL_TABLET | Freq: Two times a day (BID) | ORAL | Status: DC
  Administered 2011-06-26 – 2011-07-06 (×18): 1 via ORAL
  Filled 2011-06-26 (×24): qty 1

## 2011-06-26 MED ORDER — WARFARIN SODIUM 2.5 MG PO TABS
2.5000 mg | ORAL_TABLET | Freq: Once | ORAL | Status: AC
  Administered 2011-06-26: 2.5 mg via ORAL
  Filled 2011-06-26: qty 1

## 2011-06-26 NOTE — Progress Notes (Signed)
ANTICOAGULATION CONSULT NOTE - Follow-Up Consult  Pharmacy Consult for Warfarin Indication: atrial fibrillation  No Known Allergies  Patient Measurements: Height: 5\' 4"  (162.6 cm) Weight: 258 lb 9.6 oz (117.3 kg) IBW/kg (Calculated) : 59.2   Labs:  Basename 06/26/11 0305 06/25/11 0305 06/24/11 0315 06/23/11 1554  HGB 10.7* 11.4* -- --  HCT 33.6* 35.4* 36.3* --  PLT 234 230 228 --  APTT -- -- -- --  LABPROT 24.3* 28.5* 31.4* --  INR 2.14* 2.63* 2.97* --  HEPARINUNFRC -- -- -- --  CREATININE 2.58* 2.99* 2.81* --  CKTOTAL -- -- -- 24  CKMB -- -- -- 1.6  TROPONINI -- -- -- <0.30   Estimated Creatinine Clearance: 32.4 ml/min (by C-G formula based on Cr of 2.58).  Medical History: Past Medical History  Diagnosis Date  . COPD exacerbation   . Atrial fibrillation     Cardioversion 2007  . Hypertension   . OSA (obstructive sleep apnea)     declines mask  . Depression   . Gouty arthritis   . CAD (coronary artery disease)     predominantly single vessel  . Ventricular tachycardia     s/p defibrillator-Medtronic EnTrust [id number]  . Dilated cardiomyopathy     s/p defibrillator Medtronic EnTrust [phone]  . Asthma   . 0981 Lead   . Implantable Defibrillator     Medtronic Entrust  . Chronic systolic heart failure   . Hypothyroidism   . Acute renal failure 06/06/2011  . Solitary kidney 06/05/2006  . Gout   . CHF (congestive heart failure)   . Pneumonia, community acquired     bilateral bibasilar   . Shortness of breath on exertion     Medications:  Scheduled:     . albuterol  2.5 mg Nebulization Q6H   And  . ipratropium  0.5 mg Nebulization Q6H  . allopurinol  300 mg Oral Daily  . amiodarone  200 mg Oral Daily  . antiseptic oral rinse  15 mL Mouth Rinse q12n4p  . aspirin EC  81 mg Oral Daily  . budesonide-formoterol  2 puff Inhalation BID  . carvedilol  6.25 mg Oral BID WC  . chlorhexidine  15 mL Mouth Rinse BID  . hydrALAZINE  25 mg Oral BID  . insulin aspart   0-20 Units Subcutaneous TID WC  . levothyroxine  100 mcg Oral QAC breakfast  . warfarin  1 mg Oral ONCE-1800  . DISCONTD: furosemide  40 mg Oral BID   Patient's usual warfarin dosage is reportedly 2.5mg  daily Inpatient warfarin doses this admission:  2.5mg , 2mg .    Diet: Dysphagia I   Assessment:  66 y/o M on chronic warfarin for atrial fibrillation  INR therapeutic but dropping  Amiodarone noted (typically increases INR on warfarin) - continued from prior to admission.   Goal INR:  2-3   Plan:  1) Increase warfarin dose for today back up to 2.5mg  after 1mg  yesterday 2) PT/INR daily while inpatient   Hessie Knows, PharmD, BCPS 06/26/2011 9:15 AM [phone]

## 2011-06-26 NOTE — Progress Notes (Signed)
Physical Therapy Evaluation Patient Details Name: Cody Faulkner MRN: [id number] DOB: [birth year] Today's Date: 06/26/2011 1020-1041 Ev2  Problem List:  Patient Active Problem List  Diagnoses  . 6213 Lead  . Atrial fibrillation  . AICD in situ  . Dilated cardiomyopathy  . Chronic systolic heart failure  . Ventricular tachycardia  . CAP (community acquired pneumonia)  . Hypothyroidism  . Asthma  . Sepsis  . Acute renal failure  . Morbid obesity  . Long-term (current) use of anticoagulants  . CAD (coronary artery disease)  . Sleep apnea  . Hypertensive heart disease without CHF  . Solitary kidney  . Hyperkalemia  . Respiratory failure, acute  . CKD (chronic kidney disease)    Past Medical History:  Past Medical History  Diagnosis Date  . COPD exacerbation   . Atrial fibrillation     Cardioversion 2007  . Hypertension   . OSA (obstructive sleep apnea)     declines mask  . Depression   . Gouty arthritis   . CAD (coronary artery disease)     predominantly single vessel  . Ventricular tachycardia     s/p defibrillator-Medtronic EnTrust D154  . Dilated cardiomyopathy     s/p defibrillator Medtronic EnTrust [phone]  . Asthma   . 7846 Lead   . Implantable Defibrillator     Medtronic Entrust  . Chronic systolic heart failure   . Hypothyroidism   . Acute renal failure 06/06/2011  . Solitary kidney 06/05/2006  . Gout   . CHF (congestive heart failure)   . Pneumonia, community acquired     bilateral bibasilar   . Shortness of breath on exertion    Past Surgical History:  Past Surgical History  Procedure Date  . Cardiac defibrillator placement 2007    Medtronic EnTrust [phone]  . Nephrectomy 1976    Right  . Cardiac catheterization 2007    60% Stenosis mid-CFX  . Knee arthroscopy     left  . Cardioversion 06/18/2011    Procedure: CARDIOVERSION;  Surgeon: Darden Palmer., MD;  Location: [facility] OR;  Service: Cardiovascular;  Laterality: N/A;    PT  Assessment/Plan/Recommendation PT Assessment Clinical Impression Statement: Patient s/p recent hospitalization due to pneumonia with discharge to SNF in [facility] presented with decompensation due to CHF.  Will benefit from skilled PT in acute setting to maximize independence, tolerance to activity and allow eventual return to independent after resuming rehab in SNF in [facility] PT Recommendation/Assessment: Patient will need skilled PT in the acute care venue PT Problem List: Decreased strength;Decreased mobility;Decreased range of motion;Pain;Decreased activity tolerance Barriers to Discharge: Decreased caregiver support PT Therapy Diagnosis : Difficulty walking;Acute pain;Generalized weakness PT Plan PT Frequency: Min 3X/week PT Treatment/Interventions: DME instruction;Gait training;Functional mobility training;Therapeutic activities;Therapeutic exercise;Patient/family education PT Recommendation Follow Up Recommendations: Skilled nursing facility Equipment Recommended: Defer to next venue PT Goals  Acute Rehab PT Goals PT Goal Formulation: With patient Time For Goal Achievement: 2 weeks Pt will go Supine/Side to Sit: with supervision PT Goal: Supine/Side to Sit - Progress: Goal set today Pt will go Sit to Supine/Side: with supervision PT Goal: Sit to Supine/Side - Progress: Goal set today Pt will go Sit to Stand: with supervision;with upper extremity assist PT Goal: Sit to Stand - Progress: Goal set today Pt will go Stand to Sit: with supervision;with upper extremity assist PT Goal: Stand to Sit - Progress: Goal set today Pt will Ambulate: 51 - 150 feet;with min assist;with rolling walker PT Goal: Ambulate - Progress:  Goal set today Pt will Perform Home Exercise Program: with supervision, verbal cues required/provided PT Goal: Perform Home Exercise Program - Progress: Goal set today  PT Evaluation Precautions/Restrictions  Precautions Precautions: Fall Restrictions Weight  Bearing Restrictions: No Prior Functioning  Home Living Type of Home: Skilled Nursing Facility (was at home alone till hospitalized with PNA) Prior Function Level of Independence: Requires assistive device for independence;Needs assistance with gait;Needs assistance with tranfers (was independent till recent hospital stay) Cognition Cognition Arousal/Alertness: Awake/alert Overall Cognitive Status: Appears within functional limits for tasks assessed Orientation Level: Oriented X4 Sensation/Coordination Sensation Light Touch: Impaired Detail Light Touch Impaired Details: Impaired LLE;Impaired RLE (decreased on plantar surfaces of feet) Extremity Assessment RLE Assessment RLE Assessment: Within Functional Limits LLE AROM (degrees) LLE Overall AROM Comments: able to demonstrate knee flexion approx 60* sitting with leg dangling; ankle WFL LLE Strength LLE Overall Strength Comments: noted antigravity strength, did not test due to pain Mobility (including Balance) Bed Mobility Bed Mobility: No (pt up in recliner) Transfers Sit to Stand: 3: Mod assist;With upper extremity assist;From chair/3-in-1;With armrests Sit to Stand Details (indicate cue type and reason): cues to use armrests, limited by left knee pain Stand to Sit: 3: Mod assist;To chair/3-in-1;With armrests;With upper extremity assist Stand to Sit Details: cues for controlled lowering with UE assist on armrests Ambulation/Gait Ambulation/Gait: Yes Ambulation/Gait Assistance: 1: +2 Total assist Ambulation/Gait Assistance Details (indicate cue type and reason): pt=75%  with cues for WBAT left LE Ambulation Distance (Feet): 15 Feet Assistive device: Rolling walker Gait Pattern: Decreased stance time - left;Antalgic;Decreased step length - right;Trunk flexed  Posture/Postural Control Posture/Postural Control: Postural limitations Postural Limitations: reports h/o LLD on left (noted shorter sitting but with knee flexed.) Exercise    Total Joint Exercises Long Arc Quad: AROM;Left;5 reps;Seated (encouraged AROM as tolerated to improve pain) End of Session PT - End of Session Equipment Utilized During Treatment: Gait belt Activity Tolerance: Patient limited by pain Patient left: in chair;with call bell in reach General Behavior During Session: [facility] for tasks performed Cognition: [facility] for tasks performed  [facility] 06/26/2011, 11:48 AM

## 2011-06-26 NOTE — Progress Notes (Signed)
Name: Cody Faulkner MRN: [id number] DOB: 1945-07-20  LOS: 4  CRITICAL CARE follow up NOTE  History of Present Illness: 66 y/o male with chronic heart failure well known to our service from a recent hospitalization for pneumonia and septic shock presented to the [facility] ED on 1/15 with two days of leg swelling and one day of worsening shortness of breath. Of note, he was told to d/c his lasix on discharge during the most recent hospitalization.  Lines / Drains: 06/23/11 BIPAP >>  Cultures / Sepsis markers: 06/23/11 blood >>  Antibiotics: 06/23/11 Vanc x1  06/23/11 Zosyn x1   Tests / Events:  Subjective: Pt indicates he feels better, hopeful to be able to go home when he feels better.  C/o constipation.    Vital Signs:   Filed Vitals:   06/26/11 0400 06/26/11 0800 06/26/11 0815 06/26/11 0829  BP: 111/56  127/58   Pulse: 57  59   Temp: 97.9 F (36.6 C) 98.3 F (36.8 C)    TempSrc: Oral Oral    Resp: 21  20   Height:      Weight:      SpO2: 95%  92% 95%     Physical Examination: Gen: No distress HEENT: mucosa moist, voice is hoarse PULM: resp's even/non-labored, lungs bilaterally distant but clear CV: s1s2 regular, no murmur AB: soft, nontender, +BS Ext: venous stasis changes, trace edema Neuro: Alert, follows commands, moves all extremities Psych: normal mood, behavior  Labs and Imaging:    Lab 06/26/11 0305 06/25/11 0305 06/24/11 0315  NA 139 142 145  K 3.3* 3.6 3.8  CL 100 100 102  CO2 29 30 33*  BUN 50* 46* 37*  CREATININE 2.58* 2.99* 2.81*  GLUCOSE 93 89 81    Lab 06/26/11 0305 06/25/11 0305 06/24/11 0315  HGB 10.7* 11.4* 11.4*  HCT 33.6* 35.4* 36.3*  WBC 5.0 6.0 6.1  PLT 234 230 228    1/18 CXR:    Slightly diminished aeration. Little change in cardiomegaly and probable pulmonary vascular congestion.   Assessment and Plan:  66 y/o male with chronic heart failure (2007 LVEF 35%, 2012 55%) who presents with respiratory failure most likely due to an  exacerbation of his heart failure.  No fevers or chills to suggest pneumonia and though he does have asthma he is massively volume overloaded so heart failure is the most likely diagnosis.  Suspect this has something to do with his lasix being d/c'd at the last hospitalization.  Respiratory failure, acute (06/23/2011) Assessment: as above, due to heart failure, complicated by what appears to be underlying OSA/OHS  Lab 06/26/11 0305 06/24/11 0315 06/23/11 0015  PROBNP 312.6* 594.1* 893.7*  Plan:  -keep even at this point-->diuresis 1/18 (may be QOD regimen -BPAP qhs, Will order AutoSet BiPAP, think he'll need to go back to the nursing home on this -IS, pulmonary hygiene  Acute on chronic diastolic heart failure. Assessment: acute heart failure exacerbation.  Most recent echo suggest normal LVEF, so likely diastolic dysfunction  Plan:  -continue afterload reduction with hydralazine -keep even, we need to identify optimal diuretic regimen at this point -continue carvedilol  Atrial fibrillation  Assessment: s/p d/c cardioversion in most recent hospitalization. Rate controlled Lab Results  Component Value Date   INR 2.14* 06/26/2011   INR 2.63* 06/25/2011   INR 2.97* 06/24/2011   Plan:  -continue amiodarone and warfarin dose per pharmacy  Hypothyroidism    Plan:  -continue synthroid  Asthma  Assessment: do not  feel this is an asthma exacerbation  Plan:  -scheduled nebs and home ICS  CAD (coronary artery disease) (11/12/2005)  Assessment: no acute evidence of ischemia  Plan:  -continue home asa, coreg  CKD (chronic kidney disease) (06/23/2011) Recent Labs  Basename 06/26/11 0305 06/25/11 0305 06/24/11 0315   CREATININE 2.58* 2.99* 2.81*  Assessment: creatinine trending up 1/17 with diuretics Plan:  -monitor uop -diuresis 1/18, may be QOD diuresis candidate  Hyperglycemia CBG (last 3)   Basename 06/26/11 0759 06/25/11 2153 06/25/11 1638  GLUCAP 91 84 96   plan: ssi  History of gout Plan: -continue allopurinol  Dysphasia Plan -Continue a dysphagia 1 diet  Constipation Plan -Will add stool softener  Physical debilitation Plan -PT / OT consult  Encephalopathy 2nd to hypercapnia.  Resolved  Code: Full  Best practices / Disposition:  Step-down status, PCCM service Feeding/protein malnutrition: Modified carbohydrate Thromboprophylaxis: Coumadin HOB >30 degrees Ulcer prophylaxis: n/a Glucose control/hyperglycemia: SSI    Canary Brim, NP-C [facility] Pgr: [phone]   Reviewed above, examined pt and agree with assessment plan.  He has improvement clinically.  Will need to determine optimal dosing regimen for his diuretics.  Keep in SDU today.  Coralyn Helling, MD 06/26/2011, 2:07 PM Pager:  [phone]

## 2011-06-27 ENCOUNTER — Other Ambulatory Visit (HOSPITAL_COMMUNITY): Payer: Medicare HMO

## 2011-06-27 LAB — CBC
Hemoglobin: 11.1 g/dL — ABNORMAL LOW (ref 13.0–17.0)
MCH: 28.2 pg (ref 26.0–34.0)
MCV: 87.8 fL (ref 78.0–100.0)
RBC: 3.93 MIL/uL — ABNORMAL LOW (ref 4.22–5.81)

## 2011-06-27 LAB — BASIC METABOLIC PANEL
CO2: 31 mEq/L (ref 19–32)
Glucose, Bld: 100 mg/dL — ABNORMAL HIGH (ref 70–99)
Potassium: 3.9 mEq/L (ref 3.5–5.1)
Sodium: 139 mEq/L (ref 135–145)

## 2011-06-27 LAB — GLUCOSE, CAPILLARY
Glucose-Capillary: 103 mg/dL — ABNORMAL HIGH (ref 70–99)
Glucose-Capillary: 91 mg/dL (ref 70–99)
Glucose-Capillary: 88 mg/dL (ref 70–99)

## 2011-06-27 MED ORDER — PANTOPRAZOLE SODIUM 40 MG PO TBEC
40.0000 mg | DELAYED_RELEASE_TABLET | Freq: Two times a day (BID) | ORAL | Status: DC
  Administered 2011-06-27 – 2011-07-06 (×17): 40 mg via ORAL
  Filled 2011-06-27 (×22): qty 1

## 2011-06-27 MED ORDER — WARFARIN SODIUM 4 MG PO TABS
4.0000 mg | ORAL_TABLET | Freq: Once | ORAL | Status: AC
  Administered 2011-06-27: 4 mg via ORAL
  Filled 2011-06-27: qty 1

## 2011-06-27 MED ORDER — BISOPROLOL FUMARATE 5 MG PO TABS
5.0000 mg | ORAL_TABLET | Freq: Every day | ORAL | Status: DC
  Administered 2011-06-27 – 2011-07-06 (×9): 5 mg via ORAL
  Filled 2011-06-27 (×11): qty 1

## 2011-06-27 NOTE — Progress Notes (Signed)
ANTICOAGULATION CONSULT NOTE - Follow-Up Consult  Pharmacy Consult for Warfarin Indication: atrial fibrillation  No Known Allergies  Patient Measurements: Height: 5\' 4"  (162.6 cm) Weight: 258 lb 9.6 oz (117.3 kg) IBW/kg (Calculated) : 59.2   Labs:  Basename 06/27/11 0315 06/26/11 0305 06/25/11 0305  HGB 11.1* 10.7* --  HCT 34.5* 33.6* 35.4*  PLT 242 234 230  APTT -- -- --  LABPROT 21.3* 24.3* 28.5*  INR 1.81* 2.14* 2.63*  HEPARINUNFRC -- -- --  CREATININE 2.35* 2.58* 2.99*  CKTOTAL -- -- --  CKMB -- -- --  TROPONINI -- -- --   Estimated Creatinine Clearance: 35.6 ml/min (by C-G formula based on Cr of 2.35).  Medical History: Past Medical History  Diagnosis Date  . COPD exacerbation   . Atrial fibrillation     Cardioversion 2007  . Hypertension   . OSA (obstructive sleep apnea)     declines mask  . Depression   . Gouty arthritis   . CAD (coronary artery disease)     predominantly single vessel  . Ventricular tachycardia     s/p defibrillator-Medtronic EnTrust D154  . Dilated cardiomyopathy     s/p defibrillator Medtronic EnTrust [phone]  . Asthma   . 0981 Lead   . Implantable Defibrillator     Medtronic Entrust  . Chronic systolic heart failure   . Hypothyroidism   . Acute renal failure 06/06/2011  . Solitary kidney 06/05/2006  . Gout   . CHF (congestive heart failure)   . Pneumonia, community acquired     bilateral bibasilar   . Shortness of breath on exertion     Medications:  Scheduled:     . albuterol  2.5 mg Nebulization Q6H   And  . ipratropium  0.5 mg Nebulization Q6H  . allopurinol  300 mg Oral Daily  . amiodarone  200 mg Oral Daily  . antiseptic oral rinse  15 mL Mouth Rinse q12n4p  . aspirin EC  81 mg Oral Daily  . bisoprolol  5 mg Oral Daily  . budesonide-formoterol  2 puff Inhalation BID  . chlorhexidine  15 mL Mouth Rinse BID  . furosemide  40 mg Intravenous Once  . hydrALAZINE  25 mg Oral BID  . insulin aspart  0-20 Units  Subcutaneous TID WC  . levothyroxine  100 mcg Oral QAC breakfast  . pantoprazole  40 mg Oral BID AC  . potassium chloride  40 mEq Oral Once  . senna-docusate  1 tablet Oral BID  . warfarin  2.5 mg Oral ONCE-1800  . DISCONTD: carvedilol  6.25 mg Oral BID WC   Patient's usual warfarin dosage is reportedly 2.5mg  daily Inpatient warfarin doses this admission:  2.5mg , 2mg , 1mg , 2.5mg    Diet: Dysphagia I   Assessment:  66 y/o M on chronic warfarin for atrial fibrillation  INR now subtherapeutic and dropped again  Amiodarone noted (typically increases INR on warfarin) - continued from prior to admission.   Goal INR:  2-3   Plan:  1) Boost to 4mg  today 2) PT/INR daily while inpatient   Hessie Knows, PharmD, BCPS 06/27/2011 12:49 PM [phone]

## 2011-06-27 NOTE — Progress Notes (Signed)
Name: Cody Faulkner MRN: [id number] DOB: 1945-07-08  LOS: 5  PCCM Progress Note  Brief patient profile: : 66 y/o male with chronic heart failure   presented to the [facility] ED on 1/15 with two days of leg swelling and one day of worsening shortness of breath. Of note, he was told to d/c his lasix on discharge during the most recent hospitalization.  Lines / Drains: 06/23/11 BIPAP >>  Cultures / Sepsis markers: 1/15  BC x 2 >>> 1/15 MRSA Screen > neg  Antibiotics: 06/23/11 Vanc x1  06/23/11 Zosyn x1   Tests / Events:  Subjective: Pt indicates he feels better, hopeful to be able to go home when he feels better.  Very hoarse        Intake/Output Summary (Last 24 hours) at 06/27/11 0842 Last data filed at 06/27/11 0600  Gross per 24 hour  Intake    800 ml  Output   1850 ml  Net  -1050 ml    Vital Signs:   Filed Vitals:   06/26/11 2352 06/27/11 0000 06/27/11 0400 06/27/11 0416  BP: 118/55   131/56  Pulse:  53 54   Temp:  98.2 F (36.8 C) 98.1 F (36.7 C)   TempSrc:  Axillary Axillary   Resp:  16 20   Height:      Weight:      SpO2: 95% 95% 95%    On 3lpm NP  Physical Examination: Gen: No distress HEENT: mucosa moist, voice is hoarse PULM: resp's even/non-labored, lungs bilaterally distant but clear CV: s1s2 regular, no murmur AB: soft, nontender, +BS Ext: venous stasis changes, trace edema Neuro: Alert, follows commands, moves all extremities Psych: normal mood, behavior  Labs and Imaging:    Lab 06/27/11 0315 06/26/11 0305 06/25/11 0305  NA 139 139 142  K 3.9 3.3* 3.6  CL 100 100 100  CO2 31 29 30   BUN 52* 50* 46*  CREATININE 2.35* 2.58* 2.99*  GLUCOSE 100* 93 89    Lab 06/27/11 0315 06/26/11 0305 06/25/11 0305  HGB 11.1* 10.7* 11.4*  HCT 34.5* 33.6* 35.4*  WBC 5.9 5.0 6.0  PLT 242 234 230    1/18 CXR:    Slightly diminished aeration. Little change in cardiomegaly and probable pulmonary vascular congestion.   Assessment and Plan:  66 y/o male with  chronic heart failure (2007 LVEF 35%, 2012 55%) who presented with respiratory failure most likely due to an exacerbation of his heart failure.     Respiratory failure, acute (06/23/2011) Assessment: as above, due to heart failure, complicated by what appears to be underlying OSA/OHS  Lab 06/26/11 0305 06/24/11 0315 06/23/11 0015  PROBNP 312.6* 594.1* 893.7*  Plan:  -keep even at this point-->diuresis 1/18 (may be QOD regimen -BPAP qhs,on r AutoSet BiPAP, think he'll need to go back to the nursing home on this -IS, pulmonary hygiene  Acute on chronic diastolic heart failure. Assessment: acute heart failure exacerbation.  Most recent echo suggest normal LVEF, so likely diastolic dysfunction  Plan:  -continue afterload reduction with hydralazine -keep even, we need to identify optimal diuretic regimen at this point -change coreg to bisoprolol due to asthmatic component    Atrial fibrillation  Assessment: s/p d/c cardioversion in most recent hospitalization. Rate controlled Lab Results  Component Value Date   INR 1.81* 06/27/2011   INR 2.14* 06/26/2011   INR 2.63* 06/25/2011   Plan:  -continue amiodarone and warfarin dose per pharmacy Check esr ? Could he have amio toxicity?  As bnp not that impressive  Hypothyroidism    Plan:  -continue synthroid  Asthma  Assessment: do not feel this is an asthma exacerbation  Plan:  -scheduled nebs and home ICS  CAD (coronary artery disease) (11/12/2005)  Assessment: no acute evidence of ischemia  Plan:  -continue home asa, bisoprolol  CKD (chronic kidney disease) (06/23/2011) Lab Results  Component Value Date   CREATININE 2.35* 06/27/2011   CREATININE 2.58* 06/26/2011   CREATININE 2.99* 06/25/2011     Hyperglycemia CBG (last 3)   Basename 06/26/11 2207 06/26/11 1735 06/26/11 1141  GLUCAP 100* 106* 95  plan: ssi  History of gout Plan: -continue allopurinol  Dysphagia Plan -Continue a dysphagia 1 diet  Constipation Plan -   stool softener  Physical debilitation Plan -PT / OT consult  Encephalopathy 2nd to hypercapnia.  Resolved  Code: Full  Best practices / Disposition:  Step-down status, PCCM service Feeding/protein malnutrition: Modified carbohydrate Thromboprophylaxis: Coumadin HOB >30 degrees Ulcer prophylaxis: PPI bid as also hoarse ? gerd/lpr Glucose control/hyperglycemia: SSI    Sandrea Hughs, MD Pulmonary and Critical Care Medicine [facility] Cell [phone]

## 2011-06-28 ENCOUNTER — Inpatient Hospital Stay (HOSPITAL_COMMUNITY): Payer: Medicare Other

## 2011-06-28 DIAGNOSIS — N189 Chronic kidney disease, unspecified: Secondary | ICD-10-CM

## 2011-06-28 DIAGNOSIS — R0902 Hypoxemia: Secondary | ICD-10-CM

## 2011-06-28 DIAGNOSIS — I5031 Acute diastolic (congestive) heart failure: Secondary | ICD-10-CM

## 2011-06-28 DIAGNOSIS — J9600 Acute respiratory failure, unspecified whether with hypoxia or hypercapnia: Secondary | ICD-10-CM

## 2011-06-28 LAB — CBC
MCHC: 32.2 g/dL (ref 30.0–36.0)
RDW: 14.1 % (ref 11.5–15.5)

## 2011-06-28 LAB — BASIC METABOLIC PANEL
BUN: 46 mg/dL — ABNORMAL HIGH (ref 6–23)
Creatinine, Ser: 2.08 mg/dL — ABNORMAL HIGH (ref 0.50–1.35)
GFR calc Af Amer: 36 mL/min — ABNORMAL LOW (ref >90)
GFR calc non Af Amer: 31 mL/min — ABNORMAL LOW (ref >90)
Potassium: 3.7 mEq/L (ref 3.5–5.1)

## 2011-06-28 LAB — SEDIMENTATION RATE: Sed Rate: 68 mm/hr — ABNORMAL HIGH (ref 0–16)

## 2011-06-28 MED ORDER — BUDESONIDE 0.5 MG/2ML IN SUSP
0.5000 mg | Freq: Two times a day (BID) | RESPIRATORY_TRACT | Status: DC
  Administered 2011-06-28 – 2011-07-06 (×17): 0.5 mg via RESPIRATORY_TRACT
  Filled 2011-06-28 (×20): qty 2

## 2011-06-28 MED ORDER — ARFORMOTEROL TARTRATE 15 MCG/2ML IN NEBU
15.0000 ug | INHALATION_SOLUTION | Freq: Two times a day (BID) | RESPIRATORY_TRACT | Status: DC
  Administered 2011-06-28 – 2011-07-06 (×17): 15 ug via RESPIRATORY_TRACT
  Filled 2011-06-28 (×26): qty 2

## 2011-06-28 MED ORDER — WARFARIN SODIUM 4 MG PO TABS
4.0000 mg | ORAL_TABLET | Freq: Once | ORAL | Status: AC
  Administered 2011-06-28: 4 mg via ORAL
  Filled 2011-06-28: qty 1

## 2011-06-28 NOTE — Progress Notes (Signed)
Name: Cody Faulkner MRN: [id number] DOB: May 31, 1946  LOS: 6  PCCM Progress Note  Brief patient profile: : 70 yowm with chronic heart failure   presented to the [facility] ED on 1/15 with two days of leg swelling and one day of worsening shortness of breath. Of note, he was told to d/c his lasix on discharge during the most recent hospitalization due to renal dysfunction  Lines / Drains: 06/23/11 BIPAP >>  Cultures / Sepsis markers: 1/15  BC x 2 >>> 1/15 MRSA Screen > neg  Antibiotics: 06/23/11 Vanc x1  06/23/11 Zosyn x1   Tests / Events: 12/30 Echo the LV systolic function is normal. The RV appears mildly dilated with probably normal systolic function. Dilated IVC suggests elevated RV filling pressure  1/19 Apneic, desats overnight  even on bipap 1/20 pm changed to bipap st   Subjective: Still very hoarse, congested cough, spending lots of time up in chair during the day per nursing but tol bipap poorly at hs due to apnea/ desats        Intake/Output Summary (Last 24 hours) at 06/28/11 0755 Last data filed at 06/28/11 0600  Gross per 24 hour  Intake    840 ml  Output   1525 ml  Net   -685 ml    Vital Signs:   Filed Vitals:   06/28/11 0220 06/28/11 0400 06/28/11 0504 06/28/11 0600  BP:  115/76    Pulse:   54 57  Temp:  98.9 F (37.2 C)    TempSrc:  Axillary    Resp:   11 22  Height:      Weight:      SpO2: 96%  99% 96%      Physical Examination: Gen: No distress on 3lpm > 96% sat HEENT: mucosa moist, voice is hoarse PULM:  insp pops and crackles bilaterally CV: s1s2 regular, no murmur AB: soft, nontender, +BS Ext: venous stasis changes, trace edema Neuro: Alert, follows commands, moves all extremities Psych: normal mood, behavior  Labs and Imaging:    Lab 06/28/11 0305 06/27/11 0315 06/26/11 0305  NA 140 139 139  K 3.7 3.9 3.3*  CL 102 100 100  CO2 29 31 29   BUN 46* 52* 50*  CREATININE 2.08* 2.35* 2.58*  GLUCOSE 96 100* 93    Lab 06/28/11 0305  06/27/11 0315 06/26/11 0305  HGB 10.3* 11.1* 10.7*  HCT 32.0* 34.5* 33.6*  WBC 5.4 5.9 5.0  PLT 245 242 234    1/20 pcxr Cardiomegaly, vascular congestion. Improved aeration. Minimal  residual bibasilar atelectasis  Assessment and Plan:  66 y/o male with chronic heart failure (2007 LVEF 35%, 2012 55%) who presented with respiratory failure most likely due to an exacerbation of his heart failure vs vol vol overload vs amiodarone effect   Respiratory failure, acute (06/23/2011) Assessment: as above, due to heart failure, complicated by what appears to be underlying OSA/OHS  Lab 06/26/11 0305 06/24/11 0315 06/23/11 0015  PROBNP 312.6* 594.1* 893.7*   -IS, pulmonary hygien  Acute on chronic diastolic heart failure. Assessment: acute heart failure exacerbation.  Most recent echo suggest normal LVEF, so likely diastolic dysfunction  Plan:  -continue afterload reduction with hydralazine  -changed coreg to bisoprolol  1/19 due to asthmatic component    Atrial fibrillation  Assessment: s/p d/c cardioversion in most recent hospitalization. Rate controlled Lab Results  Component Value Date   INR 1.72* 06/28/2011   INR 1.81* 06/27/2011   INR 2.14* 06/26/2011   Plan:  -  continue amiodarone and warfarin dose per pharmacy ESR 68 1/20 > stopped amiodarone ? Needs multaq   Hypothyroidism    Plan:  -continue synthroid  Asthma  Assessment: do not feel this is an asthma exacerbation  Plan:  -scheduled nebs and home ICS  CAD (coronary artery disease) (11/12/2005)  Assessment: no acute evidence of ischemia  Plan:  -continue home asa, bisoprolol  CKD (chronic kidney disease) (06/23/2011) Lab Results  Component Value Date   CREATININE 2.08* 06/28/2011   CREATININE 2.35* 06/27/2011   CREATININE 2.58* 06/26/2011     Hyperglycemia CBG (last 3)   Basename 06/27/11 2206 06/27/11 1605 06/27/11 1137  GLUCAP 88 105* 91  plan: ssi  History of gout Plan: -continue  allopurinol  Dysphagia Plan -Continue a dysphagia 1 diet  Constipation Plan -  stool softener  Physical debilitation Plan -PT / OT consult  Encephalopathy 2nd to hypercapnia.  Resolved  Code: Full  Best practices / Disposition:  Step-down status, PCCM service Feeding/protein malnutrition: Modified carbohydrate Thromboprophylaxis: Coumadin HOB >30 degrees Ulcer prophylaxis: PPI bid as also hoarse ? gerd/lpr Glucose control/hyperglycemia: SSI    Sandrea Hughs, MD Pulmonary and Critical Care Medicine [facility] Cell [phone]

## 2011-06-28 NOTE — Progress Notes (Signed)
ANTICOAGULATION CONSULT NOTE - Follow-Up Consult  Pharmacy Consult for Warfarin Indication: atrial fibrillation  No Known Allergies  Patient Measurements: Height: 5\' 4"  (162.6 cm) Weight: 258 lb 9.6 oz (117.3 kg) IBW/kg (Calculated) : 59.2   Labs:  Basename 06/28/11 0305 06/27/11 0315 06/26/11 0305  HGB 10.3* 11.1* --  HCT 32.0* 34.5* 33.6*  PLT 245 242 234  APTT -- -- --  LABPROT 20.5* 21.3* 24.3*  INR 1.72* 1.81* 2.14*  HEPARINUNFRC -- -- --  CREATININE 2.08* 2.35* 2.58*  CKTOTAL -- -- --  CKMB -- -- --  TROPONINI -- -- --   Estimated Creatinine Clearance: 40.2 ml/min (by C-G formula based on Cr of 2.08).  Medical History: Past Medical History  Diagnosis Date  . COPD exacerbation   . Atrial fibrillation     Cardioversion 2007  . Hypertension   . OSA (obstructive sleep apnea)     declines mask  . Depression   . Gouty arthritis   . CAD (coronary artery disease)     predominantly single vessel  . Ventricular tachycardia     s/p defibrillator-Medtronic EnTrust [id number]  . Dilated cardiomyopathy     s/p defibrillator Medtronic EnTrust [phone]  . Asthma   . 5409 Lead   . Implantable Defibrillator     Medtronic Entrust  . Chronic systolic heart failure   . Hypothyroidism   . Acute renal failure 06/06/2011  . Solitary kidney 06/05/2006  . Gout   . CHF (congestive heart failure)   . Pneumonia, community acquired     bilateral bibasilar   . Shortness of breath on exertion     Medications:  Scheduled:     . allopurinol  300 mg Oral Daily  . antiseptic oral rinse  15 mL Mouth Rinse q12n4p  . arformoterol  15 mcg Nebulization BID  . aspirin EC  81 mg Oral Daily  . bisoprolol  5 mg Oral Daily  . budesonide  0.5 mg Nebulization BID  . chlorhexidine  15 mL Mouth Rinse BID  . hydrALAZINE  25 mg Oral BID  . insulin aspart  0-20 Units Subcutaneous TID WC  . levothyroxine  100 mcg Oral QAC breakfast  . pantoprazole  40 mg Oral BID AC  . senna-docusate  1 tablet Oral  BID  . warfarin  4 mg Oral ONCE-1800  . DISCONTD: albuterol  2.5 mg Nebulization Q6H  . DISCONTD: amiodarone  200 mg Oral Daily  . DISCONTD: budesonide-formoterol  2 puff Inhalation BID  . DISCONTD: carvedilol  6.25 mg Oral BID WC  . DISCONTD: ipratropium  0.5 mg Nebulization Q6H   Patient's usual warfarin dosage is reportedly 2.5mg  daily Inpatient warfarin doses this admission:  2.5mg , 2mg , 1mg , 2.5mg , 4mg    Diet: Dysphagia I   Assessment:  66 y/o M on chronic warfarin for atrial fibrillation  INR continues to be slightly subtherapeutic and dropped again  Note that amiodarone was discontinued today due to elevated ESR - possible Multaq per CCM notes  Goal INR:  2-3   Plan:  1) Repeat 4mg  today 2) PT/INR daily while inpatient   Hessie Knows, PharmD, BCPS 06/28/2011 8:28 AM [phone]

## 2011-06-29 LAB — GLUCOSE, CAPILLARY
Glucose-Capillary: 89 mg/dL (ref 70–99)
Glucose-Capillary: 100 mg/dL — ABNORMAL HIGH (ref 70–99)

## 2011-06-29 LAB — BASIC METABOLIC PANEL
Chloride: 102 mEq/L (ref 96–112)
Creatinine, Ser: 1.9 mg/dL — ABNORMAL HIGH (ref 0.50–1.35)
GFR calc Af Amer: 40 mL/min — ABNORMAL LOW (ref >90)
Potassium: 3.6 mEq/L (ref 3.5–5.1)
Sodium: 139 mEq/L (ref 135–145)

## 2011-06-29 LAB — CBC
HCT: 33.5 % — ABNORMAL LOW (ref 39.0–52.0)
Hemoglobin: 10.7 g/dL — ABNORMAL LOW (ref 13.0–17.0)
RDW: 14.3 % (ref 11.5–15.5)
WBC: 6.7 10*3/uL (ref 4.0–10.5)

## 2011-06-29 LAB — PROTIME-INR
INR: 1.61 — ABNORMAL HIGH (ref 0.00–1.49)
Prothrombin Time: 19.4 seconds — ABNORMAL HIGH (ref 11.6–15.2)

## 2011-06-29 MED ORDER — WARFARIN SODIUM 6 MG PO TABS
6.0000 mg | ORAL_TABLET | Freq: Once | ORAL | Status: AC
  Administered 2011-06-29: 6 mg via ORAL
  Filled 2011-06-29: qty 1

## 2011-06-29 MED ORDER — ACETAMINOPHEN 325 MG PO TABS
ORAL_TABLET | ORAL | Status: AC
  Filled 2011-06-29: qty 2

## 2011-06-29 MED ORDER — ACETAMINOPHEN 650 MG RE SUPP: 650.0000 mg | Freq: Four times a day (QID) | RECTAL | Status: DC | PRN

## 2011-06-29 MED ORDER — ACETAMINOPHEN 325 MG PO TABS
650.0000 mg | ORAL_TABLET | Freq: Four times a day (QID) | ORAL | Status: DC | PRN
  Administered 2011-06-29: 650 mg via ORAL

## 2011-06-29 NOTE — Progress Notes (Signed)
Physical Therapy Treatment Patient Details Name: Moriah Loughry MRN: [id number] DOB: [birth year] Today's Date: 06/29/2011 Time: Time: 4098-1191   1G PT Assessment/Plan  PT - Assessment/Plan Comments on Treatment Session: Pt with limited activity tolerance this session-c/o significant back pain and L knee instability, as well as general weakness. Continue to recommend ST reahb at SNF. PT Frequency: Min 3X/week Follow Up Recommendations: Skilled nursing facility Equipment Recommended: Defer to next venue PT Goals  Acute Rehab PT Goals PT Goal: Sit to Stand - Progress: Progressing toward goal PT Goal: Stand to Sit - Progress: Progressing toward goal PT Goal: Ambulate - Progress: Progressing toward goal PT Goal: Perform Home Exercise Program - Progress: Progressing toward goal  PT Treatment Precautions/Restrictions  Precautions Precautions: Fall Required Braces or Orthoses: No Restrictions Weight Bearing Restrictions: No Mobility (including Balance) Bed Mobility Bed Mobility: No Transfers Transfers: Yes Sit to Stand: 1: +2 Total assist;From chair/3-in-1 (Pt=50%) Sit to Stand Details (indicate cue type and reason): x 2. VCs safety, technique, hand placement. Assist to rise, stabilize. Increased time. Pt with increased back pain-limiting activity Stand to Sit: 3: Mod assist;With upper extremity assist;With armrests;To chair/3-in-1 Stand to Sit Details: x 2. VCs safety, technique, hand placement. Assist to control descent. Increased time. Ambulation/Gait Ambulation/Gait: Yes Ambulation/Gait Assistance: 1: +2 Total assist (Pt=50%) Ambulation/Gait Assistance Details (indicate cue type and reason): VCs safety, posture, distance from RW. Increased difficulty this session. Pt unable to tolerate activity-limited by back pain and L knee instability. Increased time. Heavy reliance on bil UEs in order to weightshift and advance LEs and RW. Followed with recliner. Ambulation Distance (Feet): 5  Feet Assistive device: Rolling walker Gait Pattern: Decreased step length - left;Decreased step length - right;Decreased stride length;Trunk flexed;Decreased weight shift to left;Step-to pattern    Exercise  General Exercises - Lower Extremity Long Arc Quad: AROM;Both;10 reps;Seated End of Session PT - End of Session Equipment Utilized During Treatment: Gait belt Activity Tolerance: Patient limited by pain Patient left: in chair;with call bell in reach General Behavior During Session: [facility] for tasks performed Cognition: [facility] for tasks performed  Rebeca Alert [facility] 06/29/2011, 12:17 PM [phone]

## 2011-06-29 NOTE — Progress Notes (Signed)
Nutrition Brief Note  - Pt screened for dysphagia with thickened liquids. Pt reports eating well PTA with no changes in appetite, not on any thickened liquids PTA. Pt thinks his dysphagia is related to fluid weight gain causing difficulty breathing. Pt consuming 100% of meals and does not mind using thickener. No educational needs at this time. No nutrition diagnosis at this time either. Will continue to monitor intake.  Dietitian # [phone]

## 2011-06-29 NOTE — Progress Notes (Signed)
0235- Pt repeatedly ripping off bipap mask, Pt refuses to wear.  Pt placed on 3 LPM [place] at this time.  Pt tolerating well, RT to monitor and assess as needed

## 2011-06-29 NOTE — Progress Notes (Signed)
Noncompliant with bipap, kept removing and throwing parts of bipap on floor. Instructed on importance of keeping O2 on as prescribed. Sats in mid 90s on bipap, SB-SR 1st deg AVB on monitor.

## 2011-06-29 NOTE — Progress Notes (Signed)
Name: Cody Faulkner MRN: [id number] DOB: 08/08/1945  LOS: 7  PCCM Progress Note  Brief patient profile: : 43 yowm with chronic heart failure   presented to the [facility] ED on 1/15 with two days of leg swelling and one day of worsening shortness of breath. Of note, he was told to d/c his lasix on discharge during the most recent hospitalization due to renal dysfunction  Lines / Drains: 06/23/11 BIPAP(nocturnal) >>  Cultures / Sepsis markers: 1/15  BC x 2 >>>neg 1/15 MRSA Screen > neg  Antibiotics: 06/23/11 Vanc x1  06/23/11 Zosyn x1   Tests / Events: 12/30 Echo: LV systolic function is normal. The RV appears mildly dilated with probably normal systolic function. Dilated IVC suggests elevated RV filling pressure 1/19 Apneic, desats overnight  even on bipap 1/20 pm changed to bipap st, desat 79 on bipap st. ? Needs trach?   Subjective: Sitting up in chair sleeping. Arouses to voice. Apnea noted.        Intake/Output Summary (Last 24 hours) at 06/29/11 1034 Last data filed at 06/29/11 0600  Gross per 24 hour  Intake    600 ml  Output   1370 ml  Net   -770 ml    Vital Signs:   Filed Vitals:   06/29/11 0610 06/29/11 0800 06/29/11 0830 06/29/11 0845  BP: 139/74     Pulse: 105     Temp:  98.1 F (36.7 C)    TempSrc:  Oral    Resp: 19     Height:      Weight:      SpO2: 92%  92% 79%      Physical Examination: Gen: Asleep in chair HEENT: mucosa moist, voice is hoarse PULM:  insp pops and crackles bilaterally CV: s1s2 regular, no murmur AB: soft, nontender, +BS Ext: venous stasis changes, trace edema Neuro: Alert, follows commands, moves all extremities Psych: normal mood, behavior  Labs and Imaging:    Lab 06/29/11 0320 06/28/11 0305 06/27/11 0315  NA 139 140 139  K 3.6 3.7 3.9  CL 102 102 100  CO2 28 29 31   BUN 39* 46* 52*  CREATININE 1.90* 2.08* 2.35*  GLUCOSE 96 96 100*    Lab 06/29/11 0320 06/28/11 0305 06/27/11 0315  HGB 10.7* 10.3* 11.1*  HCT 33.5*  32.0* 34.5*  WBC 6.7 5.4 5.9  PLT 257 245 242    1/20 pcxr.   Cardiomegaly, vascular congestion. Improved aeration. Minimal  residual bibasilar atelectasis  Assessment and Plan:  66 y/o male with chronic heart failure (2007 LVEF 35%, 2012 55%) who presented with respiratory failure most likely due to an exacerbation of his heart failure vs vol vol overload vs amiodarone effect   Respiratory failure, acute (06/23/2011) Assessment: as above, due to heart failure, complicated by what appears to be underlying OSA/OHS  Lab 06/26/11 0305 06/24/11 0315 06/23/11 0015  PROBNP 312.6* 594.1* 893.7*   -IS, pulmonary hygiene     Acute on chronic diastolic heart failure. Assessment: acute heart failure exacerbation.  Most recent echo suggest normal LVEF, so likely diastolic dysfunction Plan:  -continue afterload reduction with hydralazine -changed coreg to bisoprolol  1/19 due to asthmatic component    Atrial fibrillation  Assessment: s/p d/c cardioversion in most recent hospitalization. Rate controlled Lab Results  Component Value Date   INR 1.61* 06/29/2011   INR 1.72* 06/28/2011   INR 1.81* 06/27/2011   Plan:  -continue amiodarone and warfarin dose per pharmacy ESR 68 1/20 > stopped  amiodarone ? Needs multaq   Hypothyroidism    Plan:  -continue synthroid  Asthma  Assessment: do not feel this is an asthma exacerbation  Plan:  -scheduled nebs and home ICS  CAD (coronary artery disease) (11/12/2005)  Assessment: no acute evidence of ischemia  Plan:  -continue home asa, bisoprolol  CKD (chronic kidney disease) (06/23/2011) Lab Results  Component Value Date   CREATININE 1.90* 06/29/2011   CREATININE 2.08* 06/28/2011   CREATININE 2.35* 06/27/2011     Hyperglycemia CBG (last 3)   Basename 06/29/11 0732 06/28/11 1619 06/28/11 1247  GLUCAP 89 101* 128*  plan: ssi  History of gout Plan: -continue allopurinol  Dysphagia Plan -Continue a dysphagia 1  diet  Constipation Plan -  stool softener  Physical debilitation Plan -PT / OT consult  Encephalopathy 2nd to hypercapnia.  Resolved  Code: Full  Best practices / Disposition:  Step-down status, PCCM service Feeding/protein malnutrition: Modified carbohydrate Thromboprophylaxis: Coumadin HOB >30 degrees Ulcer prophylaxis: PPI bid as also hoarse ? gerd/lpr Glucose control/hyperglycemia: SSI    Brett Canales Minor ACNP Adolph Pollack PCCM Pager [phone] till 3 pm If no answer page [phone] 06/29/2011, 10:35 AM  Pt independently  seen and examined and available cxr's reviewed and I agree with above findings/ imp/ plan  Sandrea Hughs, MD Pulmonary and Critical Care Medicine [facility] Cell [phone]

## 2011-06-29 NOTE — Progress Notes (Signed)
ANTICOAGULATION CONSULT NOTE - Follow-Up Consult  Pharmacy Consult for Warfarin Indication: atrial fibrillation  No Known Allergies  Patient Measurements: Height: 5\' 4"  (162.6 cm) Weight: 261 lb 3.9 oz (118.5 kg) IBW/kg (Calculated) : 59.2   Labs:  Basename 06/29/11 0320 06/28/11 0305 06/27/11 0315  HGB 10.7* 10.3* --  HCT 33.5* 32.0* 34.5*  PLT 257 245 242  APTT -- -- --  LABPROT 19.4* 20.5* 21.3*  INR 1.61* 1.72* 1.81*  HEPARINUNFRC -- -- --  CREATININE 1.90* 2.08* 2.35*  CKTOTAL -- -- --  CKMB -- -- --  TROPONINI -- -- --   Estimated Creatinine Clearance: 44.2 ml/min (by C-G formula based on Cr of 1.9).  Medical History: Past Medical History  Diagnosis Date  . COPD exacerbation   . Atrial fibrillation     Cardioversion 2007  . Hypertension   . OSA (obstructive sleep apnea)     declines mask  . Depression   . Gouty arthritis   . CAD (coronary artery disease)     predominantly single vessel  . Ventricular tachycardia     s/p defibrillator-Medtronic EnTrust [id number]  . Dilated cardiomyopathy     s/p defibrillator Medtronic EnTrust [phone]  . Asthma   . 9604 Lead   . Implantable Defibrillator     Medtronic Entrust  . Chronic systolic heart failure   . Hypothyroidism   . Acute renal failure 06/06/2011  . Solitary kidney 06/05/2006  . Gout   . CHF (congestive heart failure)   . Pneumonia, community acquired     bilateral bibasilar   . Shortness of breath on exertion     Medications:  Scheduled:     . allopurinol  300 mg Oral Daily  . antiseptic oral rinse  15 mL Mouth Rinse q12n4p  . arformoterol  15 mcg Nebulization BID  . aspirin EC  81 mg Oral Daily  . bisoprolol  5 mg Oral Daily  . budesonide  0.5 mg Nebulization BID  . chlorhexidine  15 mL Mouth Rinse BID  . hydrALAZINE  25 mg Oral BID  . insulin aspart  0-20 Units Subcutaneous TID WC  . levothyroxine  100 mcg Oral QAC breakfast  . pantoprazole  40 mg Oral BID AC  . senna-docusate  1 tablet Oral  BID  . warfarin  4 mg Oral ONCE-1800     Diet: Dysphagia I  Assessment:  66 y/o M on chronic warfarin for atrial fibrillation  Patient's usual warfarin dosage is reportedly 2.5mg  daily  INR continues to drop despite on higher than home dose  Note that amiodarone was discontinued 1/20 due to elevated ESR - possible Multaq per CCM notes  Renal function better, CBC stable, no bleeding/complications noted  Goal INR:  2-3   Plan:   Will increase Coumadin to 6 mg po x 1 today.  Pt will eventually need lower dosage of Coumadin given recent d/c of Amiodarone.    Will f/u with daily PT/INR  Geoffry Paradise, PharmD.   Pager:  [phone] 9:39 AM

## 2011-06-29 NOTE — Progress Notes (Signed)
Occupational Therapy Evaluation Patient Details Name: Cody Faulkner MRN: [id number] DOB: [birth year] Today's Date: 06/29/2011 8:27-9:11  evII Problem List:  Patient Active Problem List  Diagnoses  . 1308 Lead  . Atrial fibrillation  . AICD in situ  . Dilated cardiomyopathy  . Chronic systolic heart failure  . Ventricular tachycardia  . CAP (community acquired pneumonia)  . Hypothyroidism  . Asthma  . Sepsis  . Acute renal failure  . Morbid obesity  . Long-term (current) use of anticoagulants  . CAD (coronary artery disease)  . Sleep apnea  . Hypertensive heart disease without CHF  . Solitary kidney  . Hyperkalemia  . Respiratory failure, acute  . CKD (chronic kidney disease)    Past Medical History:  Past Medical History  Diagnosis Date  . COPD exacerbation   . Atrial fibrillation     Cardioversion 2007  . Hypertension   . OSA (obstructive sleep apnea)     declines mask  . Depression   . Gouty arthritis   . CAD (coronary artery disease)     predominantly single vessel  . Ventricular tachycardia     s/p defibrillator-Medtronic EnTrust [id number]  . Dilated cardiomyopathy     s/p defibrillator Medtronic EnTrust [phone]  . Asthma   . 4696 Lead   . Implantable Defibrillator     Medtronic Entrust  . Chronic systolic heart failure   . Hypothyroidism   . Acute renal failure 06/06/2011  . Solitary kidney 06/05/2006  . Gout   . CHF (congestive heart failure)   . Pneumonia, community acquired     bilateral bibasilar   . Shortness of breath on exertion    Past Surgical History:  Past Surgical History  Procedure Date  . Cardiac defibrillator placement 2007    Medtronic EnTrust [phone]  . Nephrectomy 1976    Right  . Cardiac catheterization 2007    60% Stenosis mid-CFX  . Knee arthroscopy     left  . Cardioversion 06/18/2011    Procedure: CARDIOVERSION;  Surgeon: Darden Palmer., MD;  Location: [facility] OR;  Service: Cardiovascular;  Laterality: N/A;    OT  Assessment/Plan/Recommendation OT Assessment Clinical Impression Statement: 66 yr old male with recent hospitalization.  Now with further increased weakness and PNA.  Presents with decreased overall independence with ADLs secondary to impaired endurance and balance.  Will benefit from acute OT services to address these deficits in order to return to PLOF.  Feel pt will need extensive rehab at SNF level prior to D/C home. OT Recommendation/Assessment: Patient will need skilled OT in the acute care venue OT Problem List: Decreased strength;Decreased activity tolerance;Decreased range of motion;Impaired balance (sitting and/or standing);Decreased knowledge of use of DME or AE;Decreased safety awareness;Cardiopulmonary status limiting activity Barriers to Discharge: Decreased caregiver support OT Therapy Diagnosis : Generalized weakness OT Plan OT Frequency: Min 2X/week OT Treatment/Interventions: Self-care/ADL training;Therapeutic activities;DME and/or AE instruction;Balance training;Patient/family education;Energy conservation OT Recommendation Follow Up Recommendations: Skilled nursing facility Equipment Recommended: Defer to next venue Individuals Consulted Consulted and Agree with Results and Recommendations: Patient OT Goals Acute Rehab OT Goals OT Goal Formulation: With patient Time For Goal Achievement: 2 weeks ADL Goals Pt Will Perform Grooming: with supervision;Standing at sink;Other (comment) (2 tasks.) ADL Goal: Grooming - Progress: Goal set today Pt Will Perform Lower Body Bathing: with min assist;Sit to stand from bed;with adaptive equipment ADL Goal: Lower Body Bathing - Progress: Goal set today Pt Will Perform Lower Body Dressing: with min assist;with adaptive equipment;Sit to stand from  bed ADL Goal: Lower Body Dressing - Progress: Goal set today Pt Will Transfer to Toilet: with supervision;with DME;3-in-1 ADL Goal: Toilet Transfer - Progress: Goal set today Pt Will Perform  Toileting - Clothing Manipulation: with supervision;Sitting on 3-in-1 or toilet;Standing ADL Goal: Toileting - Clothing Manipulation - Progress: Goal set today Pt Will Perform Toileting - Hygiene: with supervision;Sit to stand from 3-in-1/toilet ADL Goal: Toileting - Hygiene - Progress: Goal set today  OT Evaluation Precautions/Restrictions  Precautions Precautions: Fall Required Braces or Orthoses: No Restrictions Weight Bearing Restrictions: No Prior Functioning Home Living Lives With: Alone Receives Help From: Family Type of Home: Mobile home (pt plans to stay with [name] on d/c??) Home Layout: One level Home Access: Stairs to enter [employer] Shower/Tub: Engineer, manufacturing systems: Standard Bathroom Accessibility: Yes Home Adaptive Equipment: Environmental consultant - rolling Additional Comments: Discharge plan is SNF secondary to limited assist available at home  Prior Function Level of Independence: Independent with basic ADLs;Independent with homemaking with ambulation;Independent with gait Vocation: Full time employment ADL ADL Eating/Feeding: Performed;Independent Where Assessed - Eating/Feeding: Chair Grooming: Performed;Wash/dry hands;Wash/dry face;Set up Grooming Details (indicate cue type and reason): Pt sitting in chair at the sink.  Unable to tolerated standing after transfering from the bed to the sink. Where Assessed - Grooming: Sitting, chair Upper Body Bathing: Simulated;Set up Where Assessed - Upper Body Bathing: Unsupported;Sitting, bed Lower Body Bathing: Simulated;Maximal assistance Where Assessed - Lower Body Bathing: Sit to stand from bed Upper Body Dressing: Simulated;Set up Where Assessed - Upper Body Dressing: Sitting, bed;Unsupported Lower Body Dressing: Simulated;Maximal assistance Where Assessed - Lower Body Dressing: Sit to stand from bed Toilet Transfer: Simulated;Moderate assistance Toilet Transfer Details (indicate cue type and reason): Pt  utilized RW for mobility.  Demonstrates moderate trunk flexion in standing and constant mod assist. Toilet Transfer Method: Proofreader: Raised toilet seat with arms (or 3-in-1 over toilet) Toileting - Clothing Manipulation: Simulated;Moderate assistance Where Assessed - Toileting Clothing Manipulation: Sit to stand from 3-in-1 or toilet Toileting - Hygiene: Simulated;Maximal assistance Toileting - Hygiene Details (indicate cue type and reason): Pt with decreased ability to reach his peri area while standing. Where Assessed - Toileting Hygiene: Sit to stand from 3-in-1 or toilet Tub/Shower Transfer: Not assessed Tub/Shower Transfer Method: Not assessed Equipment Used: Rolling walker Ambulation Related to ADLs: Pt exhibits moderated trunk flexion in standing.  Noted one episode of LOB secondary to his left knee buckling.  Only able to tolerate short distance secondary to fatigue and balance issues, approximately 8 ft. ADL Comments: Pt fatigues easily with activity.  Needs increased time and multiple rest breaks.  Needs mod assist for sit to stand from bed and chair at this time.  Also demonstrates decreased ability to reach his feet for LB dressing.  May benefit from AE instruction to help increase independence. Vision/Perception  Vision - History Baseline Vision: No visual deficits Patient Visual Report: No change from baseline Cognition Cognition Arousal/Alertness: Awake/alert Overall Cognitive Status: Appears within functional limits for tasks assessed Difficult to assess due to: hard of hearing/deaf Safety/Judgement: Good awareness of safety precautions Sensation/Coordination Sensation Light Touch: Appears Intact Stereognosis: Not tested Hot/Cold: Not tested Proprioception: Not tested Coordination Gross Motor Movements are Fluid and Coordinated: Yes Fine Motor Movements are Fluid and Coordinated: Yes Extremity Assessment RUE Assessment RUE Assessment:  Exceptions to [facility] RUE AROM (degrees) RUE Overall AROM Comments: AROM for shoulder flexion 0-110 degrees, shoulder strength 3+/5, all other joints AROM WFLs and strength 4/5 LUE Assessment  LUE Assessment: Exceptions to [facility] LUE AROM (degrees) LUE Overall AROM Comments: Shoulder flexion 0- 80 degrees, strength 2-/5.  All other joints 4/5 throughout and AROM WFLs. Mobility  Bed Mobility Bed Mobility: No Transfers Transfers: Yes Sit to Stand: 3: Mod assist;From bed;With upper extremity assist Exercises   End of Session OT - End of Session Equipment Utilized During Treatment: Other (comment) (Rolling Walker) Activity Tolerance: Patient limited by fatigue Patient left: in chair;with call bell in reach General Behavior During Session: [facility] for tasks performed Cognition: [facility] for tasks performed   Broady Lafoy OTR/L 06/29/2011, 10:16 AM  Pager number [phone]

## 2011-06-30 ENCOUNTER — Inpatient Hospital Stay (HOSPITAL_COMMUNITY): Payer: Medicare Other

## 2011-06-30 DIAGNOSIS — I5031 Acute diastolic (congestive) heart failure: Secondary | ICD-10-CM

## 2011-06-30 DIAGNOSIS — J9600 Acute respiratory failure, unspecified whether with hypoxia or hypercapnia: Secondary | ICD-10-CM

## 2011-06-30 DIAGNOSIS — R0902 Hypoxemia: Secondary | ICD-10-CM

## 2011-06-30 LAB — CBC
HCT: 33.7 % — ABNORMAL LOW (ref 39.0–52.0)
MCV: 88 fL (ref 78.0–100.0)
RDW: 14.2 % (ref 11.5–15.5)
WBC: 7.7 10*3/uL (ref 4.0–10.5)

## 2011-06-30 LAB — BASIC METABOLIC PANEL
BUN: 32 mg/dL — ABNORMAL HIGH (ref 6–23)
CO2: 30 mEq/L (ref 19–32)
Chloride: 102 mEq/L (ref 96–112)
Creatinine, Ser: 1.61 mg/dL — ABNORMAL HIGH (ref 0.50–1.35)
GFR calc Af Amer: 49 mL/min — ABNORMAL LOW (ref >90)
Glucose, Bld: 95 mg/dL (ref 70–99)

## 2011-06-30 LAB — GLUCOSE, CAPILLARY
Glucose-Capillary: 95 mg/dL (ref 70–99)
Glucose-Capillary: 123 mg/dL — ABNORMAL HIGH (ref 70–99)

## 2011-06-30 MED ORDER — WARFARIN SODIUM 6 MG PO TABS
6.0000 mg | ORAL_TABLET | Freq: Once | ORAL | Status: AC
  Administered 2011-06-30: 6 mg via ORAL
  Filled 2011-06-30: qty 1

## 2011-06-30 MED ORDER — FUROSEMIDE 40 MG PO TABS
40.0000 mg | ORAL_TABLET | ORAL | Status: DC
  Administered 2011-06-30 – 2011-07-02 (×2): 40 mg via ORAL
  Filled 2011-06-30 (×2): qty 1

## 2011-06-30 MED ORDER — POTASSIUM CHLORIDE CRYS ER 20 MEQ PO TBCR
20.0000 meq | EXTENDED_RELEASE_TABLET | ORAL | Status: DC
  Administered 2011-06-30 – 2011-07-06 (×4): 20 meq via ORAL
  Filled 2011-06-30 (×4): qty 1

## 2011-06-30 MED ORDER — FOOD THICKENER (THICKENUP CLEAR)
1.0000 | ORAL | Status: DC | PRN
  Filled 2011-06-30: qty 1.4
  Filled 2011-06-30: qty 120

## 2011-06-30 NOTE — Progress Notes (Signed)
ANTICOAGULATION CONSULT NOTE - Follow-Up Consult  Pharmacy Consult for Warfarin Indication: atrial fibrillation  No Known Allergies  Patient Measurements: Height: 5\' 4"  (162.6 cm) Weight: 255 lb 8.2 oz (115.9 kg) IBW/kg (Calculated) : 59.2   Labs:  Basename 06/30/11 0310 06/29/11 0320 06/28/11 0305  HGB 10.5* 10.7* --  HCT 33.7* 33.5* 32.0*  PLT 244 257 245  APTT -- -- --  LABPROT 21.2* 19.4* 20.5*  INR 1.80* 1.61* 1.72*  HEPARINUNFRC -- -- --  CREATININE 1.61* 1.90* 2.08*  CKTOTAL -- -- --  CKMB -- -- --  TROPONINI -- -- --   Estimated Creatinine Clearance: 51.6 ml/min (by C-G formula based on Cr of 1.61).  Medical History: Past Medical History  Diagnosis Date  . COPD exacerbation   . Atrial fibrillation     Cardioversion 2007  . Hypertension   . OSA (obstructive sleep apnea)     declines mask  . Depression   . Gouty arthritis   . CAD (coronary artery disease)     predominantly single vessel  . Ventricular tachycardia     s/p defibrillator-Medtronic EnTrust [id number]  . Dilated cardiomyopathy     s/p defibrillator Medtronic EnTrust [phone]  . Asthma   . 9604 Lead   . Implantable Defibrillator     Medtronic Entrust  . Chronic systolic heart failure   . Hypothyroidism   . Acute renal failure 06/06/2011  . Solitary kidney 06/05/2006  . Gout   . CHF (congestive heart failure)   . Pneumonia, community acquired     bilateral bibasilar   . Shortness of breath on exertion     Medications:  Scheduled:     . acetaminophen      . allopurinol  300 mg Oral Daily  . antiseptic oral rinse  15 mL Mouth Rinse q12n4p  . arformoterol  15 mcg Nebulization BID  . aspirin EC  81 mg Oral Daily  . bisoprolol  5 mg Oral Daily  . budesonide  0.5 mg Nebulization BID  . furosemide  40 mg Oral QODAY  . hydrALAZINE  25 mg Oral BID  . insulin aspart  0-20 Units Subcutaneous TID WC  . levothyroxine  100 mcg Oral QAC breakfast  . pantoprazole  40 mg Oral BID AC  . potassium  chloride  20 mEq Oral QODAY  . senna-docusate  1 tablet Oral BID  . warfarin  6 mg Oral ONCE-1800  . DISCONTD: chlorhexidine  15 mL Mouth Rinse BID     Diet: Dysphagia I  Assessment:  66 y/o M on chronic warfarin for atrial fibrillation  Patient's usual warfarin dosage is reportedly 2.5mg  daily  INR increasing (1.8) after higher coumadin dose last night  Note that amiodarone was discontinued 1/20 due to elevated ESR - possible Multaq per CCM notes  Renal function better (SCr. 161), CBC stable, no bleeding/complications noted  Goal INR:  2-3   Plan:   Coumadin 6 mg po x 1 tonight. Pt will eventually need lower dosage of Coumadin given recent d/c of Amiodarone.    Will f/u with daily PT/INR  Geoffry Paradise, PharmD.   Pager:  [phone] 11:10 AM

## 2011-06-30 NOTE — Plan of Care (Signed)
Problem: Phase I Progression Outcomes Goal: Initial discharge plan identified Outcome: Progressing PT/OT eval for SNF placement

## 2011-06-30 NOTE — Progress Notes (Signed)
Pt is refusing to wear Bipap tonight, RN aware. RT to assist as needed.

## 2011-06-30 NOTE — Progress Notes (Signed)
Name: Cody Faulkner MRN: [id number] DOB: 04-15-1946  LOS: 8  PCCM Progress Note  Brief patient profile: : 59 yowm with chronic heart failure   presented to the [facility] ED on 1/15 with two days of leg swelling and one day of worsening shortness of breath. Of note, he was told to d/c his lasix on discharge during the most recent hospitalization due to renal dysfunction  Lines / Drains: 06/23/11 BIPAP(nocturnal) >>  Cultures / Sepsis markers: 1/15  BC x 2 >>>neg 1/15 MRSA Screen > neg  Antibiotics: 06/23/11 Vanc x1  06/23/11 Zosyn x1   Tests / Events: 12/30 Echo: LV systolic function is normal. The RV appears mildly dilated with probably normal systolic function. Dilated IVC suggests elevated RV filling pressure 1/19 Apneic, desats overnight  even on bipap 1/20 pm changed to bipap st, desat 79 on bipap st. ? Needs trach?   Subjective: Sitting up in chair sleeping. Arouses to voice. Apnea noted.    Temp:  [98.1 F (36.7 C)-99.4 F (37.4 C)] 98.3 F (36.8 C) (01/22 0800) Pulse Rate:  [53-60] 59  (01/22 0900) Cardiac Rhythm:  [-] Sinus bradycardia (01/22 0800) Resp:  [16-24] 21  (01/22 0900) BP: (121-134)/(61-63) 134/61 mmHg (01/22 0800) SpO2:  [91 %-99 %] 96 % (01/22 0917) Weight:  [115.9 kg (255 lb 8.2 oz)] 115.9 kg (255 lb 8.2 oz) (01/22 0500)    Intake/Output Summary (Last 24 hours) at 06/30/11 1024 Last data filed at 06/30/11 0600  Gross per 24 hour  Intake    240 ml  Output   1400 ml  Net  -1160 ml    Physical Examination: Gen:no distress.  HEENT: mucosa moist, voice is hoarse PULM:  insp pops and crackles bilaterally CV: s1s2 regular, no murmur AB: soft, nontender, +BS Ext: venous stasis changes, trace edema Neuro: Alert, follows commands, moves all extremities Psych: normal mood, behavior  Labs and Imaging:    Lab 06/30/11 0310 06/29/11 0320 06/28/11 0305  NA 138 139 140  K 3.7 3.6 3.7  CL 102 102 102  CO2 30 28 29   BUN 32* 39* 46*  CREATININE 1.61* 1.90*  2.08*  GLUCOSE 95 96 96    Lab 06/30/11 0310 06/29/11 0320 06/28/11 0305  HGB 10.5* 10.7* 10.3*  HCT 33.7* 33.5* 32.0*  WBC 7.7 6.7 5.4  PLT 244 257 245    1/22 PCXR; Diminished aeration with probable worsening of pulmonary vascular congestion.  Assessment and Plan:  66 y/o male with chronic heart failure (2007 LVEF 35%, 2012 55%) who presented with respiratory failure most likely due to an exacerbation of his heart failure vs vol vol overload vs amiodarone effect   Respiratory failure, acute (06/23/2011) Assessment: as above, due to heart failure, complicated by what appears to be underlying OSA/OHS: refusing BIPAP, have discussed this at length w/ pt. He is agreeable to try again.   Lab 06/26/11 0305 06/24/11 0315  PROBNP 312.6* 594.1*   -IS, pulmonary hygiene  -keep even to negative fluid balance, think he will need EOD diuretic regimen -encourage hs BIPAP (will try auto-titrate)  Acute on chronic diastolic heart failure. Assessment: acute heart failure exacerbation.  Most recent echo suggest normal LVEF, so likely diastolic dysfunction Plan:  -continue afterload reduction with hydralazine -changed coreg to bisoprolol  1/19 due to asthmatic component   Atrial fibrillation  Assessment: s/p d/c cardioversion in most recent hospitalization. Rate controlled Lab Results  Component Value Date   INR 1.80* 06/30/2011   INR 1.61* 06/29/2011  INR 1.72* 06/28/2011   Plan:  -continue  warfarin dose per pharmacy -ESR 68 1/20 > stopped amiodarone ? Needs multaq  Hypothyroidism    Plan:  -continue synthroid  Asthma  Assessment: do not feel this is an asthma exacerbation  Plan:  -scheduled nebs and home ICS  CAD (coronary artery disease) (11/12/2005)  Assessment: no acute evidence of ischemia  Plan:  -continue home asa, bisoprolol  CKD (chronic kidney disease) (06/23/2011) Lab Results  Component Value Date   CREATININE 1.61* 06/30/2011   CREATININE 1.90* 06/29/2011    CREATININE 2.08* 06/28/2011    plan: -close obs on chemistry w/ diuretics.    Hyperglycemia CBG (last 3)   Basename 06/30/11 0739 06/29/11 2121 06/29/11 1720  GLUCAP 95 100* 116*  plan: ssi  History of gout Plan: -continue allopurinol  Dysphagia Plan -Continue a dysphagia 1 diet  Constipation Plan -  stool softener  Physical debilitation Plan -PT / OT consult -back to SNF eventually  Encephalopathy 2nd to hypercapnia.  Resolved  Code: Full  Best practices / Disposition:  Step-down status, PCCM service Feeding/protein malnutrition: Modified carbohydrate Thromboprophylaxis: Coumadin HOB >30 degrees Ulcer prophylaxis: PPI bid as also hoarse ? gerd/lpr Glucose control/hyperglycemia:   Not clear he will improve beyond very frail qol level  Sandrea Hughs, MD Pulmonary and Critical Care Medicine [facility] Cell [phone]

## 2011-06-30 NOTE — Progress Notes (Signed)
eLink Physician-Brief Progress Note Patient Name: Floyde Dingley DOB: 1946-01-06 MRN: [id number]  Date of Service  06/30/2011    Mucoid stools per bedside RN  Intermediate prob for c diff  eICU Interventions  Check stool c diff pcr Start flagyl if positive, till then monitor without flgayl   Intervention Category Major Interventions: Other:  Shilah Hefel 06/30/2011, 6:17 PM

## 2011-06-30 NOTE — Progress Notes (Signed)
Per relatives, patient was fully functional (driving, walking, living totally independent) before hospitalization at [facility] 12/29. His only deficit was not being able to drive late at night, family was worried about his vision. Just before 12/29 they noticed he had moments of choking on foods ([employer]).   He is a retired Estate agent with a Museum/gallery curator. Per family this was an extremely dusty environment. They also mentioned he has never smoked. 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    The family stated in March 2012 he had hospital acquired delirium and this was an extreme state from his norm.   Family would like a call to update them on his prognosis. They can be reached at the following.  Thurmond Butts = [phone] Cell (Best), [phone] Home Eric = [phone]

## 2011-07-01 LAB — CBC
MCV: 86.7 fL (ref 78.0–100.0)
Platelets: 243 10*3/uL (ref 150–400)
RDW: 14.1 % (ref 11.5–15.5)
WBC: 8 10*3/uL (ref 4.0–10.5)

## 2011-07-01 LAB — BASIC METABOLIC PANEL
Calcium: 9 mg/dL (ref 8.4–10.5)
Chloride: 102 mEq/L (ref 96–112)
Creatinine, Ser: 1.43 mg/dL — ABNORMAL HIGH (ref 0.50–1.35)
GFR calc Af Amer: 57 mL/min — ABNORMAL LOW (ref >90)

## 2011-07-01 LAB — GLUCOSE, CAPILLARY: Glucose-Capillary: 116 mg/dL — ABNORMAL HIGH (ref 70–99)

## 2011-07-01 LAB — CLOSTRIDIUM DIFFICILE BY PCR: Toxigenic C. Difficile by PCR: NEGATIVE

## 2011-07-01 MED ORDER — METHYLPREDNISOLONE SODIUM SUCC 125 MG IJ SOLR
80.0000 mg | Freq: Two times a day (BID) | INTRAMUSCULAR | Status: DC
  Administered 2011-07-01 – 2011-07-04 (×7): 80 mg via INTRAVENOUS
  Filled 2011-07-01: qty 2
  Filled 2011-07-01 (×7): qty 1.28

## 2011-07-01 MED ORDER — WARFARIN SODIUM 4 MG PO TABS
4.0000 mg | ORAL_TABLET | Freq: Once | ORAL | Status: AC
  Administered 2011-07-01: 4 mg via ORAL
  Filled 2011-07-01: qty 1

## 2011-07-01 NOTE — Progress Notes (Signed)
CARE MANAGEMENT NOTE 07/01/2011  Patient:  Cody Faulkner, Cody Faulkner   Account Number:  [id number]  Date Initiated:  06/26/2011  Documentation initiated by:  PEARSON,COOKIE  Subjective/Objective Assessment:   PT ADMITTED WITH [facility] LEG SWELLING, SOB     Action/Plan:   FROM [facility]   Anticipated DC Date:  06/30/2011   Anticipated DC Plan:  SKILLED NURSING FACILITY  In-house referral  Clinical Social Worker      DC Planning Services  NA  NA      [facility] Choice  NA   Choice offered to / List presented to:  NA   DME arranged  NA      DME agency  NA     HH arranged  NA      HH agency  NA   Status of service:  In process, will continue to follow Medicare Important Message given?  NO (If response is "NO", the following Medicare IM given date fields will be blank) Date Medicare IM given:   Date Additional Medicare IM given:    Discharge Disposition:    Per UR Regulation:  Reviewed for med. necessity/level of care/duration of stay  Comments:  [id number]/Kenji Mapel,RN,BSN,CCM  06/26/11 MPEARSON, RN, BSN CSW FOLLOWING FOR DC BACK TO [facility] AT DISCHARGE.

## 2011-07-01 NOTE — Progress Notes (Addendum)
Name: Cody Faulkner MRN: [id number] DOB: 12/21/1945  LOS: 9  PCCM Progress Note  Brief patient profile: : 65 yowm with chronic heart failure   presented to the [facility] ED on 1/15 with two days of leg swelling and one day of worsening shortness of breath. Of note, he was told to d/c his lasix on discharge during the most recent hospitalization due to renal dysfunction      Cultures / Sepsis markers: 1/15  BC x 2 >>>neg 1/15 MRSA Screen > neg  Antibiotics: 06/23/11 Vanc x1  06/23/11 Zosyn x1   Tests / Events: 12/30 Echo: LV systolic function is normal. The RV appears mildly dilated with probably normal systolic function. Dilated IVC suggests elevated RV filling Pressure 06/23/11 BIPAP(nocturnal) >> 1/19 Apneic, desats overnight  even on bipap 1/20 pm changed to bipap st, desat 79 on bipap st. ? Needs trach? 1/23 start solumedrol 80 q 12 for increased infiltrates, esr 68 and amiodarone exposure   Subjective: Tolerated NIPPV last night    Temp:  [98 F (36.7 C)-98.5 F (36.9 C)] 98.3 F (36.8 C) (01/23 0800) Pulse Rate:  [52-60] 54  (01/23 0400) Cardiac Rhythm:  [-] Heart block (01/22 2000) Resp:  [14-29] 23  (01/23 0400) BP: (115-146)/(53-87) 126/55 mmHg (01/23 0400) SpO2:  [94 %-97 %] 97 % (01/23 1002) Weight:  [119.5 kg (263 lb 7.2 oz)] 119.5 kg (263 lb 7.2 oz) (01/23 0500)    Intake/Output Summary (Last 24 hours) at 07/01/11 1017 Last data filed at 07/01/11 0400  Gross per 24 hour  Intake    240 ml  Output   1325 ml  Net  -1085 ml    Physical Examination: Gen:no distress.  HEENT: mucosa moist, voice is hoarse PULM: scattered rhonchi CV: s1s2 regular, no murmur AB: soft, nontender, +BS Ext: venous stasis changes, trace edema Neuro: Alert, follows commands, moves all extremities Psych: normal mood, behavior  Labs and Imaging:    Lab 07/01/11 0300 06/30/11 0310 06/29/11 0320  NA 137 138 139  K 4.0 3.7 3.6  CL 102 102 102  CO2 29 30 28   BUN 33* 32* 39*    CREATININE 1.43* 1.61* 1.90*  GLUCOSE 102* 95 96    Lab 07/01/11 0300 06/30/11 0310 06/29/11 0320  HGB 10.0* 10.5* 10.7*  HCT 31.2* 33.7* 33.5*  WBC 8.0 7.7 6.7  PLT 243 244 257    1/22 PCXR; Diminished aeration with probable worsening of pulmonary vascular congestion.  Assessment and Plan:  66 y/o male with chronic heart failure (2007 LVEF 35%, 2012 55%) who presented with respiratory failure most likely due to an exacerbation of his heart failure vs vol vol overload vs amiodarone effect   Respiratory failure, acute (06/23/2011) Assessment: as above, due to heart failure, complicated by what appears to be underlying OSA/OHS: tolerated BIPAP last night Lab 06/26/11 0305  PROBNP 312.6*   plan -IS, pulmonary hygiene  -keep even to negative fluid balance, think he will need EOD diuretic regimen -encourage hs BIPAP (will cont  try auto-titrate) -will cont to address Goals of care but strongly favor NCB status rather than trach/vent in this setting - With esr >>> BNP and amio exposure rec trial of steroids x 48 h since cxr worsening despite neg io  Acute on chronic diastolic heart failure. Assessment: acute heart failure exacerbation.  Most recent echo suggest normal LVEF, so likely diastolic dysfunction Plan:  -continue afterload reduction with hydralazine -changed coreg to bisoprolol  1/19 due to asthmatic component  Atrial fibrillation  Assessment: s/p d/c cardioversion in most recent hospitalization. Rate controlled Lab Results  Component Value Date   INR 2.05* 07/01/2011   INR 1.80* 06/30/2011   INR 1.61* 06/29/2011   Plan:  -continue  warfarin dose per pharmacy -ESR 68 1/20 > stopped amiodarone ? Needs multaq  Hypothyroidism    Plan:  -continue synthroid  Asthma  Assessment: do not feel this is an asthma exacerbation  Plan:  -scheduled nebs and home ICS  CAD (coronary artery disease) (11/12/2005)  Assessment: no acute evidence of ischemia  Plan:  -continue home  asa, bisoprolol  CKD (chronic kidney disease) (06/23/2011) Recent Labs  Basename 07/01/11 0300 06/30/11 0310 06/29/11 0320   CREATININE 1.43* 1.61* 1.90*  plan: -close obs on chemistry w/ diuretics.    Hyperglycemia CBG (last 3)   Basename 07/01/11 0810 07/01/11 0012 06/30/11 1728  GLUCAP 92 101* 123*  plan: ssi  History of gout Plan: -continue allopurinol  Dysphagia Plan -Continue a dysphagia 1 diet  Constipation Plan -  stool softener  Physical debilitation Plan -PT / OT consult -back to SNF eventually  Encephalopathy 2nd to hypercapnia.  Resolved  Code: Full, have spoken w/ his nephew. They will try to get family together this week. Think we need to readdress goals of care. Do not think full code status is good option given his multiple co-morbids.   Best practices / Disposition:  Floor status, PCCM service Feeding/protein malnutrition: Modified carbohydrate Thromboprophylaxis: Coumadin HOB >30 degrees Ulcer prophylaxis: PPI bid as also hoarse ? gerd/lpr Glucose control/hyperglycemia:      Sandrea Hughs, MD Pulmonary and Critical Care Medicine [facility] Cell [phone]

## 2011-07-01 NOTE — Progress Notes (Signed)
ANTICOAGULATION CONSULT NOTE - Follow-Up Consult  Pharmacy Consult for Warfarin Indication: atrial fibrillation  No Known Allergies  Patient Measurements: Height: 5\' 4"  (162.6 cm) Weight: 263 lb 7.2 oz (119.5 kg) IBW/kg (Calculated) : 59.2   Labs:  Basename 07/01/11 0300 06/30/11 0310 06/29/11 0320  HGB 10.0* 10.5* --  HCT 31.2* 33.7* 33.5*  PLT 243 244 257  APTT -- -- --  LABPROT 23.5* 21.2* 19.4*  INR 2.05* 1.80* 1.61*  HEPARINUNFRC -- -- --  CREATININE 1.43* 1.61* 1.90*  CKTOTAL -- -- --  CKMB -- -- --  TROPONINI -- -- --   Estimated Creatinine Clearance: 59.1 ml/min (by C-G formula based on Cr of 1.43).  Medical History: Past Medical History  Diagnosis Date  . COPD exacerbation   . Atrial fibrillation     Cardioversion 2007  . Hypertension   . OSA (obstructive sleep apnea)     declines mask  . Depression   . Gouty arthritis   . CAD (coronary artery disease)     predominantly single vessel  . Ventricular tachycardia     s/p defibrillator-Medtronic EnTrust [id number]  . Dilated cardiomyopathy     s/p defibrillator Medtronic EnTrust [phone]  . Asthma   . 6213 Lead   . Implantable Defibrillator     Medtronic Entrust  . Chronic systolic heart failure   . Hypothyroidism   . Acute renal failure 06/06/2011  . Solitary kidney 06/05/2006  . Gout   . CHF (congestive heart failure)   . Pneumonia, community acquired     bilateral bibasilar   . Shortness of breath on exertion    Assessment:  66 y/o M on chronic warfarin for atrial fibrillation  Patient's usual warfarin dosage is reportedly 2.5mg  daily  INR now therapeutic (2.05) after higher coumadin doses than home  Note that amiodarone was discontinued 1/20 due to elevated ESR  Renal function better (SCr. 1.43), CBC stable, no bleeding/complications noted  Goal INR:  2-3   Plan:   Coumadin 4 mg po x 1 tonight.  Pt will eventually need lower dosage of Coumadin given recent d/c of Amiodarone.    Will f/u  with daily PT/INR   Geoffry Paradise, PharmD.   Pager:  [phone] 11:11 AM

## 2011-07-01 NOTE — Progress Notes (Signed)
CSW continues to follow. Pt has bed at [facility] when ready for d/c. He was a resident there prior to admission. CSW will continue to follow for return to SNF.  Vennie Homans, [place] 07/01/2011 8:40 AM [phone]

## 2011-07-01 NOTE — Progress Notes (Signed)
Physical Therapy Treatment Patient Details Name: Cody Faulkner MRN: [id number] DOB: 08/18/1945 Today's Date: 07/01/2011  PT Assessment/Plan  PT - Assessment/Plan Comments on Treatment Session: Patient frustrated at lack of progress in his mobility.  Still very dyspneic on exertion with 5 lpm nasal cannula O2 and SpO2 down low as 88% with RR as high as 30/min.  Lots of whitish sputum production with coughing fits after mobility.  Will need SNF level rehab at d/c. PT Plan: Discharge plan remains appropriate PT Frequency: Min 3X/week Follow Up Recommendations: Skilled nursing facility Equipment Recommended: Defer to next venue PT Goals  Acute Rehab PT Goals Pt will go Sit to Stand: with supervision;with upper extremity assist PT Goal: Sit to Stand - Progress: Progressing toward goal Pt will go Stand to Sit: with supervision;with upper extremity assist PT Goal: Stand to Sit - Progress: Progressing toward goal Pt will Ambulate: 51 - 150 feet;with min assist;with rolling walker PT Goal: Ambulate - Progress: Progressing toward goal Pt will Perform Home Exercise Program: with supervision, verbal cues required/provided PT Goal: Perform Home Exercise Program - Progress: Progressing toward goal  PT Treatment Precautions/Restrictions  Precautions Precautions: Fall Required Braces or Orthoses: No Restrictions Weight Bearing Restrictions: No Mobility (including Balance) Bed Mobility Bed Mobility: No (pt up in chair) Transfers Sit to Stand: 4: Min assist;With upper extremity assist;With armrests;From chair/3-in-1;3: Mod assist Sit to Stand Details (indicate cue type and reason): min assist with increased time Stand to Sit: 4: Min assist;With upper extremity assist;With armrests;To chair/3-in-1 Stand Pivot Transfers: 3: Mod assist Stand Pivot Transfer Details (indicate cue type and reason): with rolling walker bed <> BSC with +1 for line management Ambulation/Gait Ambulation/Gait Assistance: 3:  Mod assist Ambulation/Gait Assistance Details (indicate cue type and reason): cues for safety especially backing up to keep walker close (2 x amb forward and back 6' and 8') Ambulation Distance (Feet): 8 Feet Assistive device: Rolling walker Gait Pattern: Step-to pattern;Left flexed knee in stance;Trunk flexed;Decreased step length - right  Posture/Postural Control Posture/Postural Control: Postural limitations Postural Limitations: limited ability to extend trunk in standing, limited cervical AROM sitting Exercise  General Exercises - Lower Extremity Ankle Circles/Pumps: AROM;Both;10 reps;Seated Long Arc Quad: AROM;Both;10 reps;Seated Other Exercises Other Exercises: cervical rotation and flexion/ extension x 5 each with warm blanket around neck Other Exercises: also provided soft tissue massage approx 2  minutes to post cervical musculature End of Session PT - End of Session Activity Tolerance: Patient limited by fatigue Patient left: in chair;with call bell in reach General Behavior During Session: [facility] for tasks performed Cognition: [facility] for tasks performed  [facility] 07/01/2011, 5:15 PM

## 2011-07-01 NOTE — Progress Notes (Signed)
Pt continues to refuse bipap.  Pt encouraged to notify RN/ RT of any concerns.

## 2011-07-02 DIAGNOSIS — J9600 Acute respiratory failure, unspecified whether with hypoxia or hypercapnia: Secondary | ICD-10-CM

## 2011-07-02 DIAGNOSIS — I5031 Acute diastolic (congestive) heart failure: Secondary | ICD-10-CM

## 2011-07-02 DIAGNOSIS — R0902 Hypoxemia: Secondary | ICD-10-CM

## 2011-07-02 LAB — GLUCOSE, CAPILLARY: Glucose-Capillary: 182 mg/dL — ABNORMAL HIGH (ref 70–99)

## 2011-07-02 LAB — PROTIME-INR: Prothrombin Time: 22.9 seconds — ABNORMAL HIGH (ref 11.6–15.2)

## 2011-07-02 LAB — BASIC METABOLIC PANEL
BUN: 35 mg/dL — ABNORMAL HIGH (ref 6–23)
Calcium: 9.5 mg/dL (ref 8.4–10.5)
Creatinine, Ser: 1.32 mg/dL (ref 0.50–1.35)
GFR calc Af Amer: 63 mL/min — ABNORMAL LOW (ref >90)
GFR calc non Af Amer: 54 mL/min — ABNORMAL LOW (ref >90)

## 2011-07-02 MED ORDER — HYDRALAZINE HCL 50 MG PO TABS
50.0000 mg | ORAL_TABLET | Freq: Three times a day (TID) | ORAL | Status: DC
  Administered 2011-07-02 – 2011-07-06 (×12): 50 mg via ORAL
  Filled 2011-07-02 (×17): qty 1

## 2011-07-02 MED ORDER — WARFARIN SODIUM 6 MG PO TABS
6.0000 mg | ORAL_TABLET | Freq: Once | ORAL | Status: AC
  Administered 2011-07-02: 6 mg via ORAL
  Filled 2011-07-02: qty 1

## 2011-07-02 MED ORDER — FUROSEMIDE 40 MG PO TABS
40.0000 mg | ORAL_TABLET | Freq: Every day | ORAL | Status: DC
  Administered 2011-07-02 – 2011-07-03 (×2): 40 mg via ORAL
  Filled 2011-07-02 (×3): qty 1

## 2011-07-02 NOTE — Progress Notes (Signed)
ANTICOAGULATION CONSULT NOTE - Follow Up Consult  Pharmacy Consult for Coumadin Indication: atrial fibrillation  No Known Allergies  Patient Measurements: Height: 5\' 4"  (162.6 cm) Weight: 263 lb 7.2 oz (119.5 kg) IBW/kg (Calculated) : 59.2    Vital Signs: Temp: 98.6 F (37 C) (01/24 0614) Temp src: Oral (01/24 0614) BP: 164/80 mmHg (01/24 0752) Pulse Rate: 68  (01/24 0752)  Labs:  Basename 07/02/11 0335 07/01/11 0300 06/30/11 0310  HGB -- 10.0* 10.5*  HCT -- 31.2* 33.7*  PLT -- 243 244  APTT -- -- --  LABPROT 22.9* 23.5* 21.2*  INR 1.99* 2.05* 1.80*  HEPARINUNFRC -- -- --  CREATININE 1.32 1.43* 1.61*  CKTOTAL -- -- --  CKMB -- -- --  TROPONINI -- -- --   Estimated Creatinine Clearance: 64 ml/min (by C-G formula based on Cr of 1.32).   Medications:  Scheduled:    . allopurinol  300 mg Oral Daily  . antiseptic oral rinse  15 mL Mouth Rinse q12n4p  . arformoterol  15 mcg Nebulization BID  . aspirin EC  81 mg Oral Daily  . bisoprolol  5 mg Oral Daily  . budesonide  0.5 mg Nebulization BID  . furosemide  40 mg Oral QODAY  . hydrALAZINE  25 mg Oral BID  . insulin aspart  0-20 Units Subcutaneous TID WC  . levothyroxine  100 mcg Oral QAC breakfast  . methylPREDNISolone (SOLU-MEDROL) injection  80 mg Intravenous Q12H  . pantoprazole  40 mg Oral BID AC  . potassium chloride  20 mEq Oral QODAY  . senna-docusate  1 tablet Oral BID  . warfarin  4 mg Oral ONCE-1800   Infusions:   PRN: sodium chloride, acetaminophen, acetaminophen, albuterol, food thickener  Assessment:  66 y/o M on chronic warfarin for atrial fibrillation  Patient's usual warfarin dosage is reportedly 2.5mg  daily  INR down, just below therapeutic. Increased Coumadin requirements inpatient. Recent doses: 2.5, 4, 6, 6, 4 mg 1/18-23  Was on amiodarone PTA, it was discontinued 1/20 due to elevated ESR. Due to long half life, will take several weeks for med to clear and to see change in coumadin  requirements.  Renal function better (SCr. 1.32), no bleeding/complications noted  Goal INR:  2-3   Plan:   Coumadin 6 mg po x 1 tonight.    Will f/u with daily PT/INR  Gwen Her PharmD  [phone] 07/02/2011 9:20 AM

## 2011-07-02 NOTE — Progress Notes (Signed)
Name: Cody Faulkner MRN: [id number] DOB: 04-07-1946  LOS: 10  Primary = Cody Faulkner Baseline = walks full chicken house s 02 last did this late Dec 2012   PCCM Progress Note  Brief patient profile: : 66 yowm with chronic heart failure   presented to the [facility] ED on 1/15 with two days of leg swelling and one day of worsening shortness of breath while at snf/rehab [place]. Of note, he was told to d/c his lasix on discharge during the most recent hospitalization due to renal dysfunction      Cultures / Sepsis markers: 1/15  BC x 2 >>>neg 1/15 MRSA Screen > neg  Antibiotics: 06/23/11 Vanc x1  06/23/11 Zosyn x1   Tests / Events: 12/30 Echo: LV systolic function is normal. The RV appears mildly dilated with probably normal systolic function. Dilated IVC suggests elevated RV filling Pressure 06/23/11 BIPAP(nocturnal) >> 1/19 Apneic, desats overnight  even on bipap 1/20 pm changed to bipap st, desat 79 on bipap st. ? Needs trach? 1/23 start solumedrol 80 q 12 for increased infiltrates, esr 68 and amiodarone exposure   Subjective: Tolerated 4lpm no bipap overnight    Temp:  [98.3 F (36.8 C)-98.7 F (37.1 C)] 98.7 F (37.1 C) (01/24 1413) Pulse Rate:  [54-68] 62  (01/24 1413) Cardiac Rhythm:  [-]  Resp:  [18-22] 18  (01/24 1413) BP: (162-184)/(72-93) 162/72 mmHg (01/24 1413) SpO2:  [93 %-98 %] 94 % (01/24 1413)    Intake/Output Summary (Last 24 hours) at 07/02/11 1634 Last data filed at 07/02/11 1413  Gross per 24 hour  Intake    960 ml  Output   1550 ml  Net   -590 ml    Physical Examination: Gen:no distress.  HEENT: mucosa moist, voice is hoarse PULM: scattered rhonchi CV: s1s2 regular, no murmur AB: soft, nontender, +BS Ext: venous stasis changes, trace edema Neuro: Alert, follows commands, moves all extremities Psych: normal mood, behavior  Labs and Imaging:    Lab 07/02/11 0335 07/01/11 0300 06/30/11 0310  NA 138 137 138  K 4.5 4.0 3.7  CL 103 102 102    CO2 27 29 30   BUN 35* 33* 32*  CREATININE 1.32 1.43* 1.61*  GLUCOSE 162* 102* 95    Lab 07/01/11 0300 06/30/11 0310 06/29/11 0320  HGB 10.0* 10.5* 10.7*  HCT 31.2* 33.7* 33.5*  WBC 8.0 7.7 6.7  PLT 243 244 257    1/22 PCXR; Diminished aeration with probable worsening of pulmonary vascular congestion.  Assessment and Plan:  66 y/o male with chronic heart failure (2007 LVEF 35%, 2012 55%) who presented with respiratory failure most likely due to an exacerbation of his heart failure vs vol vol overload vs amiodarone effect   Respiratory failure, acute (06/23/2011) Assessment: as above, due to heart failure, complicated by what appears to be underlying OSA/OHS: no longer needing bipap 1/24  Lab 06/26/11 0305  PROBNP 312.6*   plan -IS, pulmonary hygiene  -keep even to negative fluid balance if tolerates -encourage hs BIPAP (will cont  try auto-titrate)  - With esr >>> BNP and amio exposure on 1/23 started  trial of steroids x 48 h  And recheck cxr 1/25  Acute on chronic diastolic heart failure. Assessment: acute heart failure exacerbation.  Most recent echo suggest normal LVEF, so likely diastolic dysfunction Plan:  -continue afterload reduction with hydralazine -changed coreg to bisoprolol  1/19 due to asthmatic component   Atrial fibrillation  Assessment: s/p d/c cardioversion in most  recent hospitalization. Rate controlled Lab Results  Component Value Date   INR 1.99* 07/02/2011   INR 2.05* 07/01/2011   INR 1.80* 06/30/2011   Plan:  -continue  warfarin dose per pharmacy -ESR 68 1/20 > stopped amiodarone ? Needs multaq  Hypothyroidism    Plan:  -continue synthroid  Asthma  Assessment: do not feel this is an asthma exacerbation  Plan:  -scheduled nebs and home ICS  CAD (coronary artery disease) (11/12/2005)  Assessment: no acute evidence of ischemia  Plan:  -continue home asa, bisoprolol  CKD (chronic kidney disease) (06/23/2011) Recent Labs  Basename 07/02/11  0335 07/01/11 0300 06/30/11 0310   CREATININE 1.32 1.43* 1.61*  plan: -close obs on chemistry w/ diuretics.    Hyperglycemia CBG (last 3)   Basename 07/02/11 1100 07/02/11 0728 07/01/11 2209  GLUCAP 182* 151* 178*  plan: ssi  History of gout Plan: -continue allopurinol  Dysphagia Plan -Continue a dysphagia 1 diet  Constipation Plan -  stool softener  Physical debilitation Plan -PT / OT consult -back to SNF eventually  Encephalopathy 2nd to hypercapnia.  Resolved  Code: Full, have spoken w/ his nephew. They will try to get family together this week. Think we need to readdress goals of care. Do not think full code status is good option given his multiple co-morbids.   Best practices / Disposition:  Floor status, PCCM service> [facility] am 1/25 Feeding/protein malnutrition: Modified carbohydrate Thromboprophylaxis: Coumadin HOB >30 degrees Ulcer prophylaxis: PPI bid as also hoarse ? gerd/lpr Glucose control/hyperglycemia:      Sandrea Hughs, MD Pulmonary and Critical Care Medicine [facility] Cell [phone]

## 2011-07-03 ENCOUNTER — Inpatient Hospital Stay (HOSPITAL_COMMUNITY): Payer: Medicare Other

## 2011-07-03 LAB — GLUCOSE, CAPILLARY: Glucose-Capillary: 140 mg/dL — ABNORMAL HIGH (ref 70–99)

## 2011-07-03 LAB — PROTIME-INR: Prothrombin Time: 26.9 seconds — ABNORMAL HIGH (ref 11.6–15.2)

## 2011-07-03 MED ORDER — WARFARIN SODIUM 4 MG PO TABS
4.0000 mg | ORAL_TABLET | Freq: Once | ORAL | Status: AC
  Administered 2011-07-03: 4 mg via ORAL
  Filled 2011-07-03: qty 1

## 2011-07-03 NOTE — Progress Notes (Signed)
Physical Therapy Treatment Patient Details Name: Cody Faulkner MRN: [id number] DOB: 1945-09-10 Today's Date: 07/03/2011  PT Assessment/Plan  PT - Assessment/Plan Comments on Treatment Session: Patient progressed amazingly well this session with activity tolerance with O2 and SpO2 94%, HR 74.  Able to tolerate much more weight on left knee than in past sessions as well.  Patient much less dyspneic.  Should do well in rehab. PT Plan: Discharge plan remains appropriate PT Frequency: Min 3X/week Follow Up Recommendations: Skilled nursing facility Equipment Recommended: Defer to next venue PT Goals  Acute Rehab PT Goals Pt will go Sit to Stand: with supervision PT Goal: Sit to Stand - Progress: Progressing toward goal Pt will go Stand to Sit: with supervision PT Goal: Stand to Sit - Progress: Progressing toward goal Pt will Ambulate: 51 - 150 feet;with rolling walker;with supervision PT Goal: Ambulate - Progress: Updated due to goal met  PT Treatment Precautions/Restrictions  Precautions Precautions: Fall Precaution Comments: O2 dependent Required Braces or Orthoses: No Restrictions Weight Bearing Restrictions: No Mobility (including Balance) Bed Mobility Bed Mobility: No (up in chair) Transfers Sit to Stand: 4: Min assist;With upper extremity assist;From chair/3-in-1 Sit to Stand Details (indicate cue type and reason): cues to push from chair Stand to Sit: 4: Min assist;With upper extremity assist;With armrests;To chair/3-in-1 Stand to Sit Details: cues to reach for chair Ambulation/Gait Ambulation/Gait: Yes Ambulation/Gait Assistance: 4: Min assist (+1 for O2 and chair) Ambulation/Gait Assistance Details (indicate cue type and reason): still left knee flexed in stance Ambulation Distance (Feet): 80 Feet (times 2) Assistive device: Rolling walker Gait Pattern: Left flexed knee in stance;Trunk flexed;Step-to pattern;Antalgic    Exercise    End of Session PT - End of  Session Equipment Utilized During Treatment: Gait belt Activity Tolerance: Patient tolerated treatment well Patient left: in chair General Behavior During Session: [facility] for tasks performed Cognition: [facility] for tasks performed  [facility] 07/03/2011, 2:47 PM

## 2011-07-03 NOTE — Progress Notes (Signed)
ANTICOAGULATION CONSULT NOTE - Follow Up Consult  Pharmacy Consult for Coumadin Indication: atrial fibrillation  No Known Allergies  Patient Measurements: Height: 5\' 4"  (162.6 cm) Weight: 260 lb 9.3 oz (118.2 kg) IBW/kg (Calculated) : 59.2    Vital Signs: Temp: 98.6 F (37 C) (01/25 0608) Temp src: Oral (01/25 0608) BP: 121/59 mmHg (01/25 0608) Pulse Rate: 63  (01/25 0608)  Labs:  Basename 07/03/11 0319 07/02/11 0335 07/01/11 0300  HGB -- -- 10.0*  HCT -- -- 31.2*  PLT -- -- 243  APTT -- -- --  LABPROT 26.9* 22.9* 23.5*  INR 2.44* 1.99* 2.05*  HEPARINUNFRC -- -- --  CREATININE -- 1.32 1.43*  CKTOTAL -- -- --  CKMB -- -- --  TROPONINI -- -- --   Estimated Creatinine Clearance: 63.6 ml/min (by C-G formula based on Cr of 1.32).   Medications:  Scheduled:     . allopurinol  300 mg Oral Daily  . antiseptic oral rinse  15 mL Mouth Rinse q12n4p  . arformoterol  15 mcg Nebulization BID  . aspirin EC  81 mg Oral Daily  . bisoprolol  5 mg Oral Daily  . budesonide  0.5 mg Nebulization BID  . furosemide  40 mg Oral Daily  . hydrALAZINE  50 mg Oral TID  . insulin aspart  0-20 Units Subcutaneous TID WC  . levothyroxine  100 mcg Oral QAC breakfast  . methylPREDNISolone (SOLU-MEDROL) injection  80 mg Intravenous Q12H  . pantoprazole  40 mg Oral BID AC  . potassium chloride  20 mEq Oral QODAY  . senna-docusate  1 tablet Oral BID  . warfarin  6 mg Oral ONCE-1800  . DISCONTD: furosemide  40 mg Oral QODAY  . DISCONTD: hydrALAZINE  25 mg Oral BID   Infusions:   PRN: sodium chloride, acetaminophen, acetaminophen, albuterol, food thickener  Assessment:  66 y/o M on chronic warfarin for atrial fibrillation  Patient's usual warfarin dosage is reportedly 2.5mg  daily  INR up, within tx range. Increased Coumadin requirements inpatient. Recent doses: 2.5, 4, 6, 6, 4, 6mg   1/18-24  Was on amiodarone PTA, it was discontinued 1/20 due to elevated ESR. Due to long half life,  will take several weeks for med to clear and to see change in coumadin requirements (coumadin requirements will increase).  Renal function better (SCr. 1.32, 1/24), no bleeding/complications noted  Goal INR:  2-3   Plan:   Coumadin 4 mg po x 1 tonight.    Will f/u with daily PT/INR  Gwen Her PharmD  [phone] 07/02/2011 9:20 AM

## 2011-07-03 NOTE — Progress Notes (Signed)
CSW has been following for return to SNF. Pt can return to [facility] when ready. CSW needs to update FL2 and was waiting to have plan in place regarding bipap use. CSW switching to [employer] and she can assist with return to SNF.  This CSW signing off.  Vennie Homans, [place] 07/03/2011 8:30 AM [phone]

## 2011-07-03 NOTE — Progress Notes (Signed)
PROGRESS NOTE  Cody Faulkner ZOX:[id number] DOB: Dec 09, 1945 DOA: 06/22/2011 PCP: Ailene Ravel, MD, MD  Interim History: 66 year old male with history of heart failure presented to the emergency department January 15 with leg swelling and shortness of breath. Lasix was discontinued on previous discharge from the hospital secondary to renal dysfunction. Was admitted to the critical care service for diastolic heart failure and subsequently transferred to hospitalist service today January 25.  Subjective: Overall feels okay.  Objective: Filed Vitals:   07/02/11 2050 07/02/11 2110 07/03/11 0434 07/03/11 0608  BP:  140/67  121/59  Pulse: 68 66  63  Temp:  98.7 F (37.1 C)  98.6 F (37 C)  TempSrc:  Oral  Oral  Resp: 20 18  20   Height:      Weight:    118.2 kg (260 lb 9.3 oz)  SpO2: 94% 92% 95% 94%    Intake/Output Summary (Last 24 hours) at 07/03/11 0904 Last data filed at 07/03/11 0500  Gross per 24 hour  Intake    720 ml  Output   2400 ml  Net  -1680 ml    Exam:  General: Appears calm and comfortable. Cardiovascular: Regular rate and rhythm. No murmur, rub or gallop. Normal respiratory effort. Respiratory: Coarse breath sounds. Mild increased work of breathing. No frank rales or rhonchi.  Data Reviewed: Basic Metabolic Panel:  Lab 07/02/11 8119 07/01/11 0300 06/30/11 0310 06/29/11 0320 06/28/11 0305  NA 138 137 138 139 140  K 4.5 4.0 -- -- --  CL 103 102 102 102 102  CO2 27 29 30 28 29   GLUCOSE 162* 102* 95 96 96  BUN 35* 33* 32* 39* 46*  CREATININE 1.32 1.43* 1.61* 1.90* 2.08*  CALCIUM 9.5 9.0 9.1 8.9 8.8  MG -- -- -- -- --  PHOS -- -- -- -- --   CBC:  Lab 07/01/11 0300 06/30/11 0310 06/29/11 0320 06/28/11 0305 06/27/11 0315  WBC 8.0 7.7 6.7 5.4 5.9  NEUTROABS -- -- -- -- --  HGB 10.0* 10.5* 10.7* 10.3* 11.1*  HCT 31.2* 33.7* 33.5* 32.0* 34.5*  MCV 86.7 88.0 87.5 86.0 87.8  PLT 243 244 257 245 242   CBG:  Lab 07/03/11 0723 07/02/11 2159 07/02/11 1720  07/02/11 1100 07/02/11 0728  GLUCAP 140* 157* 139* 182* 151*    Recent Results (from the past 240 hour(s))  MRSA PCR SCREENING     Status: Normal   Collection Time   06/23/11  1:11 PM      Component Value Range Status Comment   MRSA by PCR NEGATIVE  NEGATIVE  Final   CLOSTRIDIUM DIFFICILE BY PCR     Status: Normal   Collection Time   06/30/11  6:27 PM      Component Value Range Status Comment   C difficile by pcr NEGATIVE  NEGATIVE  Final      Studies: Dg Chest 2 View  07/03/2011  *RADIOLOGY REPORT*  Clinical Data: COPD, dyspnea  CHEST - 2 VIEW  Comparison: 06/30/2011  Findings: Cardiomediastinal silhouette is stable.  Dual lead cardiac pacemaker is unchanged in position.  There is improvement in aeration.  Mild perihilar residual interstitial prominence without convincing pulmonary edema.  Minimal basilar atelectasis. Accessory azygos fissure/ lobe again noted.  IMPRESSION:  There is improvement in aeration.  Mild perihilar residual interstitial prominence without convincing pulmonary edema. Minimal basilar atelectasis.  Accessory azygos fissure/ lobe again noted.  Original Report Authenticated By: Natasha Mead, M.D.   edema or pneumonia right greater  than left.  Probable bilateral small pleural effusion with bilateral basilar atelectasis or infiltrate.  Original Report Authenticated By: Natasha Mead, M.D.   Scheduled Meds:   . allopurinol  300 mg Oral Daily  . antiseptic oral rinse  15 mL Mouth Rinse q12n4p  . arformoterol  15 mcg Nebulization BID  . aspirin EC  81 mg Oral Daily  . bisoprolol  5 mg Oral Daily  . budesonide  0.5 mg Nebulization BID  . furosemide  40 mg Oral Daily  . hydrALAZINE  50 mg Oral TID  . insulin aspart  0-20 Units Subcutaneous TID WC  . levothyroxine  100 mcg Oral QAC breakfast  . methylPREDNISolone (SOLU-MEDROL) injection  80 mg Intravenous Q12H  . pantoprazole  40 mg Oral BID AC  . potassium chloride  20 mEq Oral QODAY  . senna-docusate  1 tablet Oral BID  .  warfarin  4 mg Oral ONCE-1800  . warfarin  6 mg Oral ONCE-1800  . DISCONTD: furosemide  40 mg Oral QODAY  . DISCONTD: hydrALAZINE  25 mg Oral BID   Continuous Infusions:    Assessment/Plan: 1. Status post acute respiratory failure: Secondary to heart failure, complicated by apparent OSA/OHS. Encourage BiPAP each bedtime. Repeat chest x-ray secondary to amiodarone exposure. Trial steroids for 48 hours. 2. Status post encephalopathy secondary to hypercapnia: Resolved. 3. Presumed diastolic heart failure acute/chronic: Continue afterload reduction with hydralazine. Continue bisoprolol. 4. Presumed OSA/OHS: Continue BiPAP each bedtime. 5. Atrial fibrillation: Continue bisoprolol. Continue warfarin. Amiodarone stopped secondary to high ESR. 6. Hypothyroidism: Continue replacement therapy. 7. Asthma: Continue scheduled meds. 8. History of coronary artery disease: Stable. Continue aspirin and bisoprolol. 9. Chronic kidney disease: Stable. Continue Lasix. 10. Hyperglycemia: Monitor. Probably related to steroids. Treatment plan unclear. Will need to discuss with pulmonology. 11. Gout: Stable. Continue allopurinol 12. Dysphasia: Continue dysphagia 1 diet. 13. Protein malnutrition:? 14. Physical debilitation: Return to skilled nursing facility when stable.  Code Status: Full code.   Disposition Plan: Return to skilled nursing facility when stable.   Brendia Sacks, MD  [facility] Pager [phone] 07/03/2011, 9:04 AM    LOS: 11 days

## 2011-07-04 LAB — BASIC METABOLIC PANEL
BUN: 60 mg/dL — ABNORMAL HIGH (ref 6–23)
Chloride: 102 mEq/L (ref 96–112)
Creatinine, Ser: 1.63 mg/dL — ABNORMAL HIGH (ref 0.50–1.35)
GFR calc Af Amer: 49 mL/min — ABNORMAL LOW (ref >90)
GFR calc non Af Amer: 42 mL/min — ABNORMAL LOW (ref >90)
Glucose, Bld: 155 mg/dL — ABNORMAL HIGH (ref 70–99)

## 2011-07-04 LAB — GLUCOSE, CAPILLARY
Glucose-Capillary: 138 mg/dL — ABNORMAL HIGH (ref 70–99)
Glucose-Capillary: 158 mg/dL — ABNORMAL HIGH (ref 70–99)

## 2011-07-04 LAB — PROTIME-INR: Prothrombin Time: 34.2 seconds — ABNORMAL HIGH (ref 11.6–15.2)

## 2011-07-04 MED ORDER — PREDNISONE 20 MG PO TABS
40.0000 mg | ORAL_TABLET | Freq: Every day | ORAL | Status: DC
  Administered 2011-07-05 – 2011-07-06 (×2): 40 mg via ORAL
  Filled 2011-07-04 (×3): qty 2

## 2011-07-04 MED ORDER — FUROSEMIDE 40 MG PO TABS
40.0000 mg | ORAL_TABLET | ORAL | Status: DC
  Administered 2011-07-05: 40 mg via ORAL
  Filled 2011-07-04 (×2): qty 1

## 2011-07-04 NOTE — Progress Notes (Signed)
Changed out cpap unit. Patient is requesting home health bring him a unit that he will use at home so they can show him how to operate it. Patient continues to take our cpap apart several times during the night an then requesting RT to put it back on him. Home compliance may be an issue

## 2011-07-04 NOTE — Progress Notes (Signed)
ANTICOAGULATION CONSULT NOTE - Follow Up Consult  Pharmacy Consult for Coumadin Indication: atrial fibrillation  No Known Allergies  Patient Measurements: Height: 5\' 4"  (162.6 cm) Weight: 261 lb (118.389 kg) IBW/kg (Calculated) : 59.2    Vital Signs: Temp: 97.3 F (36.3 C) (01/26 0916) Temp src: Oral (01/26 0916) BP: 149/77 mmHg (01/26 0916) Pulse Rate: 64  (01/26 0916)  Labs:  Alvira Philips 07/04/11 0355 07/03/11 0319 07/02/11 0335  HGB -- -- --  HCT -- -- --  PLT -- -- --  APTT -- -- --  LABPROT 34.2* 26.9* 22.9*  INR 3.32* 2.44* 1.99*  HEPARINUNFRC -- -- --  CREATININE 1.63* -- 1.32  CKTOTAL -- -- --  CKMB -- -- --  TROPONINI -- -- --   Estimated Creatinine Clearance: 51.6 ml/min (by C-G formula based on Cr of 1.63).   Medications:  Scheduled:    . allopurinol  300 mg Oral Daily  . antiseptic oral rinse  15 mL Mouth Rinse q12n4p  . arformoterol  15 mcg Nebulization BID  . aspirin EC  81 mg Oral Daily  . bisoprolol  5 mg Oral Daily  . budesonide  0.5 mg Nebulization BID  . furosemide  40 mg Oral QODAY  . hydrALAZINE  50 mg Oral TID  . insulin aspart  0-20 Units Subcutaneous TID WC  . levothyroxine  100 mcg Oral QAC breakfast  . methylPREDNISolone (SOLU-MEDROL) injection  80 mg Intravenous Q12H  . pantoprazole  40 mg Oral BID AC  . potassium chloride  20 mEq Oral QODAY  . senna-docusate  1 tablet Oral BID  . warfarin  4 mg Oral ONCE-1800  . DISCONTD: furosemide  40 mg Oral Daily   Infusions:   PRN: sodium chloride, acetaminophen, acetaminophen, albuterol, food thickener  Assessment: 66 y/o M on chronic warfarin for atrial fibrillation Patient's usual warfarin dosage is reportedly 2.5mg  daily INR now supratherapeutic at 3.32 Prior to today, pt. Had Increased Coumadin requirements inpatient. Recent doses: 2.5, 4, 6, 6, 4, 6mg  1/18-24 No CBC today, No bleeding reported Was on amiodarone PTA, it was discontinued 1/20 due to elevated ESR. Due to long half  life, will take several weeks for med to clear and to see change in coumadin requirements (coumadin requirements will increase).  Goal of Therapy:  INR 2-3   Plan:  No Coumadin tonight Follow up AM INR  Laporchia Nakajima, Loma Messing PharmD 1:21 PM 07/04/2011

## 2011-07-04 NOTE — Progress Notes (Signed)
PROGRESS NOTE  Cody Faulkner WUJ:[id number] DOB: 1945/10/25 DOA: 06/22/2011 PCP: Ailene Ravel, MD, MD  Interim History: 66 year old male with history of heart failure presented to the emergency department January 15 with leg swelling and shortness of breath. Lasix was discontinued on previous discharge from the hospital secondary to renal dysfunction. Was admitted to the critical care service for diastolic heart failure and subsequently transferred to hospitalist service today January 25.  Subjective: Overall feels okay.  Objective: Filed Vitals:   07/03/11 2105 07/04/11 0629 07/04/11 0821 07/04/11 0916  BP: 139/72 141/68  149/77  Pulse: 60 55  64  Temp: 98.2 F (36.8 C) 99.5 F (37.5 C)  97.3 F (36.3 C)  TempSrc: Oral Axillary  Oral  Resp: 18 20  18   Height:      Weight:  118.389 kg (261 lb)    SpO2: 96% 100% 94% 94%    Intake/Output Summary (Last 24 hours) at 07/04/11 1438 Last data filed at 07/04/11 0900  Gross per 24 hour  Intake    480 ml  Output    625 ml  Net   -145 ml    Exam:  General: Appears calm and comfortable. Sitting in chair watching basketball. Cardiovascular: Regular rate and rhythm. No murmur, rub or gallop. Normal respiratory effort. Respiratory: Coarse breath sounds. Mild increased work of breathing. No frank rales or rhonchi.  Data Reviewed: Basic Metabolic Panel:  Lab 07/04/11 9562 07/02/11 0335 07/01/11 0300 06/30/11 0310 06/29/11 0320  NA 140 138 137 138 139  K 3.8 4.5 -- -- --  CL 102 103 102 102 102  CO2 28 27 29 30 28   GLUCOSE 155* 162* 102* 95 96  BUN 60* 35* 33* 32* 39*  CREATININE 1.63* 1.32 1.43* 1.61* 1.90*  CALCIUM 9.2 9.5 9.0 9.1 8.9  MG -- -- -- -- --  PHOS -- -- -- -- --   CBC:  Lab 07/01/11 0300 06/30/11 0310 06/29/11 0320 06/28/11 0305  WBC 8.0 7.7 6.7 5.4  NEUTROABS -- -- -- --  HGB 10.0* 10.5* 10.7* 10.3*  HCT 31.2* 33.7* 33.5* 32.0*  MCV 86.7 88.0 87.5 86.0  PLT 243 244 257 245   CBG:  Lab 07/04/11 0743  07/03/11 1248 07/03/11 0723 07/02/11 2159 07/02/11 1720  GLUCAP 136* 129* 140* 157* 139*    Recent Results (from the past 240 hour(s))  CLOSTRIDIUM DIFFICILE BY PCR     Status: Normal   Collection Time   06/30/11  6:27 PM      Component Value Range Status Comment   C difficile by pcr NEGATIVE  NEGATIVE  Final      Studies: Dg Chest 2 View  07/03/2011  *RADIOLOGY REPORT*  Clinical Data: COPD, dyspnea  CHEST - 2 VIEW  Comparison: 06/30/2011  Findings: Cardiomediastinal silhouette is stable.  Dual lead cardiac pacemaker is unchanged in position.  There is improvement in aeration.  Mild perihilar residual interstitial prominence without convincing pulmonary edema.  Minimal basilar atelectasis. Accessory azygos fissure/ lobe again noted.  IMPRESSION:  There is improvement in aeration.  Mild perihilar residual interstitial prominence without convincing pulmonary edema. Minimal basilar atelectasis.  Accessory azygos fissure/ lobe again noted.  Original Report Authenticated By: Natasha Mead, M.D.   edema or pneumonia right greater than left.  Probable bilateral small pleural effusion with bilateral basilar atelectasis or infiltrate.  Original Report Authenticated By: Natasha Mead, M.D.   Scheduled Meds:    . allopurinol  300 mg Oral Daily  . antiseptic oral rinse  15 mL Mouth Rinse q12n4p  . arformoterol  15 mcg Nebulization BID  . aspirin EC  81 mg Oral Daily  . bisoprolol  5 mg Oral Daily  . budesonide  0.5 mg Nebulization BID  . furosemide  40 mg Oral QODAY  . hydrALAZINE  50 mg Oral TID  . insulin aspart  0-20 Units Subcutaneous TID WC  . levothyroxine  100 mcg Oral QAC breakfast  . methylPREDNISolone (SOLU-MEDROL) injection  80 mg Intravenous Q12H  . pantoprazole  40 mg Oral BID AC  . potassium chloride  20 mEq Oral QODAY  . senna-docusate  1 tablet Oral BID  . warfarin  4 mg Oral ONCE-1800  . DISCONTD: furosemide  40 mg Oral Daily   Continuous Infusions:    Assessment/Plan: 1. Status  post acute respiratory failure: Secondary to heart failure, complicated by apparent OSA/OHS. Encourage BiPAP each bedtime.  2. Status post encephalopathy secondary to hypercapnia: Resolved. 3. Presumed diastolic heart failure acute/chronic: Continue afterload reduction with hydralazine. Continue bisoprolol. Change Lasix to every other day given renal dysfunction. 4. Presumed OSA/OHS: Continue BiPAP each bedtime. 5. Atrial fibrillation: Continue bisoprolol. Continue warfarin. Amiodarone stopped secondary to high ESR. Pulmonology placed the patient on steroids empirically. Need to discuss with him long-term recommendations. 6. Hypothyroidism: Continue replacement therapy. 7. Asthma: Continue scheduled meds. 8. History of coronary artery disease: Stable. Continue aspirin and bisoprolol. 9. Chronic kidney disease: Some acute renal insufficiency. Change Lasix to every other day. Continue to monitor basic metabolic panel. Probably will need to tolerate some amount of renal dysfunction and bias of his respiratory function. 10. Hyperglycemia: Monitor. Probably related to steroids. Treatment plan unclear. Will need to discuss with pulmonology. 11. Gout: Stable. Continue allopurinol 12. Dysphasia: Continue dysphagia 1 diet. 13. Physical debilitation: Return to skilled nursing facility when stable.  Code Status: Full code.   Disposition Plan: Return to skilled nursing facility when stable.   Brendia Sacks, MD  [facility] Pager [phone] 07/04/2011, 2:38 PM    LOS: 12 days

## 2011-07-05 LAB — BASIC METABOLIC PANEL
BUN: 60 mg/dL — ABNORMAL HIGH (ref 6–23)
Calcium: 9.3 mg/dL (ref 8.4–10.5)
Chloride: 100 mEq/L (ref 96–112)
Creatinine, Ser: 1.52 mg/dL — ABNORMAL HIGH (ref 0.50–1.35)
GFR calc Af Amer: 53 mL/min — ABNORMAL LOW (ref >90)
GFR calc non Af Amer: 46 mL/min — ABNORMAL LOW (ref >90)

## 2011-07-05 LAB — PROTIME-INR
INR: 4.11 — ABNORMAL HIGH (ref 0.00–1.49)
Prothrombin Time: 40.4 seconds — ABNORMAL HIGH (ref 11.6–15.2)

## 2011-07-05 LAB — GLUCOSE, CAPILLARY: Glucose-Capillary: 156 mg/dL — ABNORMAL HIGH (ref 70–99)

## 2011-07-05 NOTE — Progress Notes (Addendum)
Patient refuses to use nocturnal PPV at this time. RT encouraged to use. Will continue to monitor. RN aware.  Patient asking for CPAP at this time. Placed without incident. VSS.

## 2011-07-05 NOTE — Progress Notes (Signed)
ANTICOAGULATION CONSULT NOTE - Follow Up Consult  Pharmacy Consult for coumadin Indication: a.fib  No Known Allergies  Patient Measurements: Height: 5\' 4"  (162.6 cm) Weight: 261 lb (118.389 kg) IBW/kg (Calculated) : 59.2    Vital Signs: Temp: 98.3 F (36.8 C) (01/27 0530) Temp src: Tympanic (01/27 0530) BP: 142/73 mmHg (01/27 0530) Pulse Rate: 60  (01/27 0530)  Labs:  Alvira Philips 07/05/11 0402 07/04/11 0355 07/03/11 0319  HGB -- -- --  HCT -- -- --  PLT -- -- --  APTT -- -- --  LABPROT 40.4* 34.2* 26.9*  INR 4.11* 3.32* 2.44*  HEPARINUNFRC -- -- --  CREATININE 1.52* 1.63* --  CKTOTAL -- -- --  CKMB -- -- --  TROPONINI -- -- --   Estimated Creatinine Clearance: 55.3 ml/min (by C-G formula based on Cr of 1.52).   Medications:  Scheduled:    . allopurinol  300 mg Oral Daily  . antiseptic oral rinse  15 mL Mouth Rinse q12n4p  . arformoterol  15 mcg Nebulization BID  . aspirin EC  81 mg Oral Daily  . bisoprolol  5 mg Oral Daily  . budesonide  0.5 mg Nebulization BID  . furosemide  40 mg Oral QODAY  . hydrALAZINE  50 mg Oral TID  . insulin aspart  0-20 Units Subcutaneous TID WC  . levothyroxine  100 mcg Oral QAC breakfast  . pantoprazole  40 mg Oral BID AC  . potassium chloride  20 mEq Oral QODAY  . predniSONE  40 mg Oral Q breakfast  . senna-docusate  1 tablet Oral BID  . DISCONTD: methylPREDNISolone (SOLU-MEDROL) injection  80 mg Intravenous Q12H   Infusions:   PRN: sodium chloride, acetaminophen, acetaminophen, albuterol, food thickener  Assessment:  66 y/o M on chronic warfarin for atrial fibrillation  Patient's usual warfarin dosage is reportedly 2.5mg  daily  INR now supratherapeutic at 4.11 and moving up.  pt. had Increased Coumadin requirements initially during this admission: Recent doses: 2.5, 4, 6, 6, 4, 6mg  1/18-24  No cbc today, no bleeding reported    Note:Was on amiodarone PTA, it was discontinued 1/20 due to elevated ESR. Due to long  half life, will take several weeks for med to clear and to see change in coumadin requirements (coumadin requirements will increase). Goal of Therapy:  INR 2-3   Plan:  1.) no coumadin tonight 2.) follow am inr  Cedar Ditullio, Loma Messing PharmD 9:31 AM 07/05/2011

## 2011-07-05 NOTE — Progress Notes (Signed)
Clinical Social Work following for [employer].  Patient is from [facility] and family is concerned there will not be a bed available for him upon return.  The last hospital admission, patient did not have a ST bed to return to thus was placed on a memory care unit. (Family: Rolm Gala reporting) if this is the case upon discharge, then they would like a new fax out and not have patient return at dc.  Will have weekday CSW follow up with nursing home regarding bed and make sure bed on rehab unit vs re faxing patient out :[facility].   Not medically stable today for dc Ashley Jacobs, MSW LCSW [phone]  Weekend coverage [phone]

## 2011-07-05 NOTE — Progress Notes (Signed)
PROGRESS NOTE  Cody Faulkner ZOX:[id number] DOB: 11-Jun-1945 DOA: 06/22/2011 PCP: Ailene Ravel, MD, MD  Interim History: 66 year old male with history of heart failure presented to the emergency department January 15 with leg swelling and shortness of breath. Lasix was discontinued on previous discharge from the hospital secondary to renal dysfunction. Was admitted to the critical care service for diastolic heart failure and subsequently transferred to hospitalist service today January 25.  Subjective: Overall feels okay.  Feels much better than when he came in on Jan 15th and states that "if I could walk I would be able to be normal"  Objective: Filed Vitals:   07/04/11 2158 07/04/11 2339 07/05/11 0530 07/05/11 0840  BP: 149/74  142/73   Pulse:  59 60   Temp:   98.3 F (36.8 C)   TempSrc:   Tympanic   Resp:  20 20   Height:      Weight:      SpO2:  93% 95% 95%    Intake/Output Summary (Last 24 hours) at 07/05/11 1206 Last data filed at 07/05/11 0900  Gross per 24 hour  Intake    700 ml  Output   1601 ml  Net   -901 ml    Exam:  General: Appears calm and comfortable. Sitting in chair watching basketball. Cardiovascular: Regular rate and rhythm. No murmur, rub or gallop. Normal respiratory effort. Respiratory: Coarse breath sounds. Mild increased work of breathing. No frank rales or rhonchi.  Data Reviewed: Basic Metabolic Panel:  Lab 07/05/11 8119 07/04/11 0355 07/02/11 0335 07/01/11 0300 06/30/11 0310  NA 136 140 138 137 138  K 4.1 3.8 -- -- --  CL 100 102 103 102 102  CO2 28 28 27 29 30   GLUCOSE 129* 155* 162* 102* 95  BUN 60* 60* 35* 33* 32*  CREATININE 1.52* 1.63* 1.32 1.43* 1.61*  CALCIUM 9.3 9.2 9.5 9.0 9.1  MG -- -- -- -- --  PHOS -- -- -- -- --   CBC:  Lab 07/01/11 0300 06/30/11 0310 06/29/11 0320  WBC 8.0 7.7 6.7  NEUTROABS -- -- --  HGB 10.0* 10.5* 10.7*  HCT 31.2* 33.7* 33.5*  MCV 86.7 88.0 87.5  PLT 243 244 257   CBG:  Lab 07/04/11 2108  07/04/11 1831 07/04/11 1243 07/04/11 0743 07/03/11 1248  GLUCAP 156* 158* 138* 136* 129*    Recent Results (from the past 240 hour(s))  CLOSTRIDIUM DIFFICILE BY PCR     Status: Normal   Collection Time   06/30/11  6:27 PM      Component Value Range Status Comment   C difficile by pcr NEGATIVE  NEGATIVE  Final      Studies: Dg Chest 2 View  07/03/2011  *RADIOLOGY REPORT*  Clinical Data: COPD, dyspnea  CHEST - 2 VIEW  Comparison: 06/30/2011  Findings: Cardiomediastinal silhouette is stable.  Dual lead cardiac pacemaker is unchanged in position.  There is improvement in aeration.  Mild perihilar residual interstitial prominence without convincing pulmonary edema.  Minimal basilar atelectasis. Accessory azygos fissure/ lobe again noted.  IMPRESSION:  There is improvement in aeration.  Mild perihilar residual interstitial prominence without convincing pulmonary edema. Minimal basilar atelectasis.  Accessory azygos fissure/ lobe again noted.  Original Report Authenticated By: Natasha Mead, M.D.   edema or pneumonia right greater than left.  Probable bilateral small pleural effusion with bilateral basilar atelectasis or infiltrate.  Original Report Authenticated By: Natasha Mead, M.D.   Scheduled Meds:    . allopurinol  300  mg Oral Daily  . antiseptic oral rinse  15 mL Mouth Rinse q12n4p  . arformoterol  15 mcg Nebulization BID  . aspirin EC  81 mg Oral Daily  . bisoprolol  5 mg Oral Daily  . budesonide  0.5 mg Nebulization BID  . furosemide  40 mg Oral QODAY  . hydrALAZINE  50 mg Oral TID  . insulin aspart  0-20 Units Subcutaneous TID WC  . levothyroxine  100 mcg Oral QAC breakfast  . pantoprazole  40 mg Oral BID AC  . potassium chloride  20 mEq Oral QODAY  . predniSONE  40 mg Oral Q breakfast  . senna-docusate  1 tablet Oral BID  . DISCONTD: methylPREDNISolone (SOLU-MEDROL) injection  80 mg Intravenous Q12H   Continuous Infusions:    Assessment/Plan: 1. Status post acute respiratory  failure: Secondary to heart failure, complicated by apparent OSA/OHS. Encourage BiPAP each bedtime.  2. Status post encephalopathy secondary to hypercapnia: Resolved.? Some element of dementia. Family in room discussed plan with them 3. Presumed diastolic heart failure acute/chronic: Continue afterload reduction with hydralazine. Continue bisoprolol. Change Lasix to every other day given renal dysfunction.  Seems euvolemic 4. Presumed OSA/OHS: Continue BiPAP each bedtime. 5. Atrial fibrillation: Continue bisoprolol. Continue warfarin. Amiodarone stopped secondary to high ESR. Hypothyroidism: Continue replacement therapy. 6. Asthma: Continue scheduled meds. Pulmonology placed the patient on steroids empirically. Have discussed with pulmonary to address  7. History of coronary artery disease: Stable. Continue aspirin and bisoprolol. 8. Chronic kidney disease: Some acute renal insufficiency. Change Lasix to every other day. Continue to monitor basic metabolic panel. Probably will need to tolerate some amount of renal dysfunction and balance  his respiratory function. 9. Hyperglycemia: Monitor, CBG = 120-150. Probably related to steroids. Treatment plan unclear. See above #6 10. Gout: Stable. Continue allopurinol 11. Dysphasia: Continue dysphagia 1 diet. 12. Physical debilitation: Return to skilled nursing facility when stable.  Code Status: Full code.   Disposition Plan: Return to skilled nursing facility when stable, once cleared her pulmonary  Pleas Koch, MD [facility] [phone]  07/05/2011, 12:06 PM    LOS: 13 days

## 2011-07-06 LAB — GLUCOSE, CAPILLARY
Glucose-Capillary: 140 mg/dL — ABNORMAL HIGH (ref 70–99)
Glucose-Capillary: 137 mg/dL — ABNORMAL HIGH (ref 70–99)
Glucose-Capillary: 130 mg/dL — ABNORMAL HIGH (ref 70–99)
Glucose-Capillary: 139 mg/dL — ABNORMAL HIGH (ref 70–99)
Glucose-Capillary: 140 mg/dL — ABNORMAL HIGH (ref 70–99)

## 2011-07-06 LAB — BASIC METABOLIC PANEL
CO2: 30 mEq/L (ref 19–32)
Calcium: 8.9 mg/dL (ref 8.4–10.5)
Chloride: 100 mEq/L (ref 96–112)
Glucose, Bld: 94 mg/dL (ref 70–99)
Potassium: 3.8 mEq/L (ref 3.5–5.1)
Sodium: 136 mEq/L (ref 135–145)

## 2011-07-06 LAB — PROTIME-INR: INR: 3.46 — ABNORMAL HIGH (ref 0.00–1.49)

## 2011-07-06 MED ORDER — TIOTROPIUM BROMIDE MONOHYDRATE 18 MCG IN CAPS: 18.0000 ug | ORAL_CAPSULE | Freq: Every day | RESPIRATORY_TRACT | Status: DC

## 2011-07-06 MED ORDER — POTASSIUM CHLORIDE CRYS ER 20 MEQ PO TBCR: 20.0000 meq | EXTENDED_RELEASE_TABLET | ORAL | Status: DC

## 2011-07-06 MED ORDER — PREDNISONE 20 MG PO TABS: 40.0000 mg | ORAL_TABLET | Freq: Every day | ORAL | Status: AC

## 2011-07-06 MED ORDER — FOOD THICKENER (THICKENUP CLEAR): 1.0000 | ORAL | Status: DC | PRN

## 2011-07-06 MED ORDER — BISOPROLOL FUMARATE 5 MG PO TABS: 5.0000 mg | ORAL_TABLET | Freq: Every day | ORAL | Status: DC

## 2011-07-06 MED ORDER — FUROSEMIDE 40 MG PO TABS: 40.0000 mg | ORAL_TABLET | ORAL | Status: DC

## 2011-07-06 NOTE — Progress Notes (Signed)
Patient set to discharge to [facility] today. Vella Raring made aware. [facility] called for transportation.   Unice Bailey, LCSWA [phone]

## 2011-07-06 NOTE — Discharge Summary (Signed)
Physician Discharge Summary  Patient ID: Cody Faulkner MRN: [id number] DOB/AGE: 68-Aug-1947 68 y.o.  Admit date: 06/22/2011 Discharge date: 07/06/2011  Admission Diagnoses: Short of breath66 year old male with history of heart failure presented to the emergency department January 15 with leg swelling and shortness of breath. Lasix was discontinued on previous discharge from the hospital secondary to renal dysfunction. Was admitted to the critical care service for diastolic heart failure and subsequently transferred to hospitalist service January 25.  Discharge Diagnoses:  Active Problems:  Atrial fibrillation  Dilated cardiomyopathy  Hypothyroidism  Asthma  CAD (coronary artery disease)  Respiratory failure, acute  CKD (chronic kidney disease)   Discharged Condition: fair  Hospital Course:   1. Status post acute respiratory failure: Secondary to heart failure, complicated by apparent OSA/OHS. Encourage BiPAP each bedtime.    2. Status post encephalopathy secondary to hypercapnia: Resolved.? Some element of dementia. Family in room discussed plan with them  3. Presumed diastolic heart failure acute/chronic: Continue afterload reduction with hydralazine. Continue bisoprolol. Change Lasix to every other day given renal dysfunction.  we'll have to tolerate some amount of volume overload versus volume depletion given his poor creatinine function and his heart failure 4. Presumed OSA/OHS: Continue BiPAP each bedtime.  5. Atrial fibrillation: Continue bisoprolol. Continue warfarin. Amiodarone stopped secondary to high ESR. Pulmonology recommended weaning off of steroids 6. Hypothyroidism: Continue replacement therapy.  7. Asthma: Continue scheduled meds. Pulmonology placed the patient on steroids empirically. Have recommended to wean steroids can be just an outpatient would recommend follow up with pulmonology then.  Placed on inhalers but may warrant nebulization as an outpatient at the nursing  facility. Nursing home physician to assess the same at current point patient's vascular status is moderately well controlled and patient will need 2 L of oxygen at rest 8. History of coronary artery disease: Stable. Continue aspirin and bisoprolol. Covered at all discontinued this admission 9. Chronic kidney disease: Some acute renal insufficiency. Change Lasix to every other day. Continue to monitor basic metabolic panel. Probably will need to tolerate some amount of renal dysfunction and balance his respiratory function.  10. Hyperglycemia: Monitor, CBG = 120-150. Probably related to steroids. Treatment plan unclear. See above #6  11. Gout: Stable. Continue allopurinol  12. Dysphasia: Continue dysphagia 1 diet. Continue thickening fluids 13. Physical debilitation: Return to skilled nursing facility when stable.  Code Status: Full code.   Disposition Plan: Return to skilled nursing facility when stable, once cleared her pulmonary  Pleas Koch, MD [facility] [phone]  Consults: None  Significant Diagnostic Studies: Found to have pH of 7.3 5P CO2 of 63 BUN of 34 and creatinine of 2.47-white blood count is within normal limits BNP of 893 INR 2.35 Blood cultures x2 negative Troponin point-of-care negative  Elects x-ray showed possible cardiomegaly with mild edema and possible consolidation  Final x-ray =There is improvement in aeration. Mild perihilar residual  interstitial prominence without convincing pulmonary edema.  Minimal basilar atelectasis. Accessory azygos fissure/ lobe again  noted.   C. difficile PCR negative  Treatments: IV hydration and antibiotics: vancomycin and which was discontinued after short period of time  Discharge Exam: Blood pressure 140/70, pulse 65, temperature 98 F (36.7 C), temperature source Oral, resp. rate 18, height 5\' 4"  (1.626 m), weight 120.5 kg (265 lb 10.5 oz), SpO2 95.00%. General: Appears calm and comfortable. Sitting in chair  watching basketball.  Cardiovascular: Regular rate and rhythm. No murmur, rub or gallop. Normal respiratory effort.  Respiratory: Coarse breath sounds.  Mild increased work of breathing. No frank rales or rhonchi.  Disposition: Skilled Nursing Facility  Discharge Orders    Future Orders Please Complete By Expires   Diet - low sodium heart healthy      Increase activity slowly      Heart Failure patients record your daily weight using the same scale at the same time of day      STOP any activity that causes chest pain, shortness of breath, dizziness, sweating, or exessive weakness      Call MD for:      Call MD for:  persistant nausea and vomiting      Call MD for:  severe uncontrolled pain      Call MD for:  difficulty breathing, headache or visual disturbances      (HEART FAILURE PATIENTS) Call MD:  Anytime you have any of the following symptoms: 1) 3 pound weight gain in 24 hours or 5 pounds in 1 week 2) shortness of breath, with or without a dry hacking cough 3) swelling in the hands, feet or stomach 4) if you have to sleep on extra pillows at night in order to breathe.      Beta Blocker already ordered      Contraindication to ACEI at discharge        Medication List  As of 07/06/2011 12:36 PM   STOP taking these medications         amiodarone 200 MG tablet      carvedilol 25 MG tablet         TAKE these medications         albuterol 108 (90 BASE) MCG/ACT inhaler   Commonly known as: PROVENTIL HFA;VENTOLIN HFA   Inhale 2 puffs into the lungs every 4 (four) hours as needed. For shortness of breath      allopurinol 300 MG tablet   Commonly known as: ZYLOPRIM   Take 300 mg by mouth daily.      aspirin 81 MG chewable tablet   Chew 81 mg by mouth daily.      bisoprolol 5 MG tablet   Commonly known as: ZEBETA   Take 1 tablet (5 mg total) by mouth daily.      budesonide-formoterol 160-4.5 MCG/ACT inhaler   Commonly known as: SYMBICORT   Inhale 2 puffs into the lungs 2  (two) times daily.      food thickener Powd   Commonly known as: RESOURCE THICKENUP CLEAR   Take 1 scoop by mouth as needed (for nectar thick diet).      furosemide 40 MG tablet   Commonly known as: LASIX   Take 1 tablet (40 mg total) by mouth every other day.      hydrALAZINE 25 MG tablet   Commonly known as: APRESOLINE   Take 1 tablet (25 mg total) by mouth 2 (two) times daily.      levothyroxine 100 MCG tablet   Commonly known as: SYNTHROID, LEVOTHROID   Take 100 mcg by mouth daily.      loratadine 10 MG tablet   Commonly known as: CLARITIN   Take 10 mg by mouth daily.      potassium chloride SA 20 MEQ tablet   Commonly known as: K-DUR,KLOR-CON   Take 1 tablet (20 mEq total) by mouth every other day.      predniSONE 20 MG tablet   Commonly known as: DELTASONE   Take 2 tablets (40 mg total) by mouth daily with breakfast.  tiotropium 18 MCG inhalation capsule   Commonly known as: SPIRIVA   Place 1 capsule (18 mcg total) into inhaler and inhale daily.      TYLENOL EX ST ARTHRITIS PAIN 500 MG tablet   Generic drug: acetaminophen   Take 500 mg by mouth every 6 (six) hours as needed. For pain      warfarin 5 MG tablet   Commonly known as: COUMADIN   Take 0.5 tablets (2.5 mg total) by mouth daily.           Follow-up Information    Follow up with [facility], MD .         Signed: Pleas Koch 07/06/2011, 12:36 PM

## 2011-07-06 NOTE — Progress Notes (Signed)
ANTICOAGULATION CONSULT NOTE - Follow Up Consult  Pharmacy Consult for coumadin Indication: a.fib  No Known Allergies  Patient Measurements: Height: 5\' 4"  (162.6 cm) Weight: 265 lb 10.5 oz (120.5 kg) IBW/kg (Calculated) : 59.2    Vital Signs: Temp: 98 F (36.7 C) (01/28 0520) Temp src: Oral (01/28 0520) BP: 140/70 mmHg (01/28 0520) Pulse Rate: 65  (01/28 0520)  Labs:  Basename 07/06/11 0320 07/05/11 0402 07/04/11 0355  HGB -- -- --  HCT -- -- --  PLT -- -- --  APTT -- -- --  LABPROT 35.3* 40.4* 34.2*  INR 3.46* 4.11* 3.32*  HEPARINUNFRC -- -- --  CREATININE 1.44* 1.52* 1.63*  CKTOTAL -- -- --  CKMB -- -- --  TROPONINI -- -- --   Estimated Creatinine Clearance: 58.9 ml/min (by C-G formula based on Cr of 1.44).   Medications:  Scheduled:     . allopurinol  300 mg Oral Daily  . antiseptic oral rinse  15 mL Mouth Rinse q12n4p  . arformoterol  15 mcg Nebulization BID  . aspirin EC  81 mg Oral Daily  . bisoprolol  5 mg Oral Daily  . budesonide  0.5 mg Nebulization BID  . furosemide  40 mg Oral QODAY  . hydrALAZINE  50 mg Oral TID  . insulin aspart  0-20 Units Subcutaneous TID WC  . levothyroxine  100 mcg Oral QAC breakfast  . pantoprazole  40 mg Oral BID AC  . potassium chloride  20 mEq Oral QODAY  . predniSONE  40 mg Oral Q breakfast  . senna-docusate  1 tablet Oral BID   Infusions:   PRN: sodium chloride, acetaminophen, acetaminophen, albuterol, food thickener  Assessment:  66 y/o M on chronic warfarin for atrial fibrillation  PTA warfarin dosage 2.5mg  daily  Pt was requiring increased Coumadin doses (recent doses: 2.5, 4, 6, 6, 4, 6mg  1/18-24.)  INR became supratherapeutic 1/26 (3.32), peaked 1/27 (4.11), now seems to be trending back down but still above goal INR (3.46)  No bleeding reported  Note:Was on amiodarone PTA, it was discontinued 1/20 due to elevated ESR. Due to long half life, will take several weeks for med to clear and to see change  in coumadin requirements (coumadin requirements will increase).  Goal of Therapy:  INR 2-3   Plan:  No Coumadin today Daily INR   Gwen Her PharmD 8:43 AM 07/06/2011

## 2011-07-06 NOTE — Progress Notes (Signed)
Occupational Therapy Treatment Patient Details Name: Cody Faulkner MRN: [id number] DOB: 1945/09/08 Today's Date: 07/06/2011 1SC [id number] OT Assessment/Plan OT Assessment/Plan Comments on Treatment Session: Plan is for d/c to [facility] today in [place]. OT Plan: Discharge plan remains appropriate OT Frequency: Min 2X/week Follow Up Recommendations: Skilled nursing facility Equipment Recommended: Defer to next venue OT Goals ADL Goals ADL Goal: Grooming - Progress: Progressing toward goals  OT Treatment Precautions/Restrictions  Precautions Precautions: Fall   ADL ADL Grooming: Teeth care;Denture care;Performed;Minimal assistance Grooming Details (indicate cue type and reason): Pt stood at sink for several minutes to complete oral care, c/o feeling "woozy." O2 saturation at 89% on 2 Liters. Increased to 93% following rest in the chair. Finished grooming task in the chair. Where Assessed - Grooming: Standing at sink;Sitting, chair;Unsupported Mobility  Transfers Transfers: Yes Sit to Stand: 4: Min assist;From chair/3-in-1;With armrests;With upper extremity assist Stand to Sit: 4: Min assist;With upper extremity assist;To chair/3-in-1;With armrests Stand to Sit Details: VC to reach back for armrests. Exercises    End of Session OT - End of Session Equipment Utilized During Treatment: Other (comment) (RW) Activity Tolerance: Patient limited by fatigue Patient left: in chair;with call bell in reach General Behavior During Session: [facility] for tasks performed Cognition: [facility] for tasks performed  Samani Deal A, OTR/L [phone]  07/06/2011, 3:25 PM

## 2011-07-06 NOTE — Progress Notes (Signed)
Physical Therapy Treatment Patient Details Name: Cody Faulkner MRN: [id number] DOB: [birth year] Today's Date: 07/06/2011 Time: [phone] Charge: Leonia Reeves PT Assessment/Plan  PT - Assessment/Plan Comments on Treatment Session: Pt doing well with increasing activity tolerance and increasing ambulation distance.  Pt also performed exercises to L LE to assist with controlling knee pain. PT Plan: Discharge plan remains appropriate Follow Up Recommendations: Skilled nursing facility Equipment Recommended: Defer to next venue PT Goals  Acute Rehab PT Goals PT Goal: Sit to Stand - Progress: Progressing toward goal PT Goal: Stand to Sit - Progress: Progressing toward goal Pt will Ambulate: >150 feet;with rolling walker;with modified independence (no rest breaks) PT Goal: Ambulate - Progress: Updated due to goal met PT Goal: Perform Home Exercise Program - Progress: Progressing toward goal  PT Treatment Precautions/Restrictions  Precautions Precautions: Fall Precaution Comments: O2 dependent Required Braces or Orthoses: No Restrictions Weight Bearing Restrictions: No Mobility (including Balance) Bed Mobility Bed Mobility: No (up in recliner) Transfers Transfers: Yes Sit to Stand: 4: Min assist;From chair/3-in-1;With upper extremity assist Sit to Stand Details (indicate cue type and reason): min/guard, verbal cue to use armrests Stand to Sit: 4: Min assist;With upper extremity assist;To chair/3-in-1 Stand to Sit Details: min/guard, pt did not demonstrate reaching back for armrests so gave verbal cue for next time Ambulation/Gait Ambulation/Gait: Yes Ambulation/Gait Assistance: 5: Supervision Ambulation/Gait Assistance Details (indicate cue type and reason): pt continues to report L knee pain with ambulation and paused twice to flex and extend knee during ambulation, verbal cue for posture, pt ambulated 60 feet with standing rest break and UEs leaning on windowsill then another 60 feet back to  room, SaO2 95% on 2L midway through ambulation, and 90% upon return to recliner. Ambulation Distance (Feet): 120 Feet Assistive device: Rolling walker Gait Pattern: Trunk flexed;Step-to pattern;Antalgic    Exercise  General Exercises - Lower Extremity Ankle Circles/Pumps: AROM;Both;20 reps Long Arc Quad: AROM;Left;20 reps;Seated;Strengthening Hip ABduction/ADduction: AROM;Strengthening;Left;10 reps;Seated Hip Flexion/Marching: AROM;Strengthening;Seated;10 reps;Left Other Exercises Other Exercises: Focused exercises on L LE 2* pt reporting L knee "problems" and crepitus felt with knee flexion and extension. End of Session PT - End of Session Activity Tolerance: Patient tolerated treatment well Patient left: in chair;with call bell in reach General Behavior During Session: [facility] for tasks performed Cognition: [facility] for tasks performed  Tonjia Parillo,KATHrine E 07/06/2011, 11:21 AM Pager: [phone]

## 2011-07-07 LAB — GLUCOSE, CAPILLARY
Glucose-Capillary: 121 mg/dL — ABNORMAL HIGH (ref 70–99)
Glucose-Capillary: 151 mg/dL — ABNORMAL HIGH (ref 70–99)

## 2011-08-18 ENCOUNTER — Encounter: Payer: Self-pay | Admitting: Adult Health

## 2011-08-18 ENCOUNTER — Ambulatory Visit (INDEPENDENT_AMBULATORY_CARE_PROVIDER_SITE_OTHER): Payer: Medicare Other | Admitting: Adult Health

## 2011-08-18 VITALS — BP 128/78 | HR 60 | Temp 99.1°F | Ht 66.0 in | Wt 208.8 lb

## 2011-08-18 DIAGNOSIS — I5022 Chronic systolic (congestive) heart failure: Secondary | ICD-10-CM

## 2011-08-18 DIAGNOSIS — G4730 Sleep apnea, unspecified: Secondary | ICD-10-CM

## 2011-08-18 DIAGNOSIS — J9600 Acute respiratory failure, unspecified whether with hypoxia or hypercapnia: Secondary | ICD-10-CM

## 2011-08-18 DIAGNOSIS — J45909 Unspecified asthma, uncomplicated: Secondary | ICD-10-CM

## 2011-08-18 NOTE — Assessment & Plan Note (Signed)
OSA /OHS  Will attempt to restart BIPAP , order sent to DME  Advised on weight loss.

## 2011-08-18 NOTE — Assessment & Plan Note (Signed)
Recent decompensated flare now resolved back to baseline

## 2011-08-18 NOTE — Patient Instructions (Addendum)
Restart BIPAP At bedtime   Continue on Advair and Spiriva  follow up Dr. Craige Cotta  In 3 weeks with PFT  Please contact office for sooner follow up if symptoms do not improve or worsen or seek emergency care   Late Add 08/20/11 >decrease Prednisone 10mg  1/2 daily until seen back in office .

## 2011-08-18 NOTE — Assessment & Plan Note (Signed)
Cont on Advair and Spiriva  follow up 3 weeks with PFT

## 2011-08-18 NOTE — Progress Notes (Signed)
  Subjective:    Patient ID: Cody Faulkner, male    DOB: [birth year], 66 y.o.   MRN: [id number]  HPI 66 yo male never smoker seen for pulmonary consult 06/23/11 for acute respiratory failure secondary to decompensated CHF   08/18/2011 Post Hospital  Pt was admitted 1/14-1/28/13 for acute resp failure secondary to decompensated heart failure , OSA/OHS. Visit was complicated by encephalopathy secondary to hypercapnia and dementia.  Tx with pulmonary hygiene regimen, BIPAP, and diuresis along with IV abx and steroids for possible asthma component. Discharged to Rehab and went home last week.  He was not discharged on BIPAP. Says he has a hx of Asthma currently on Advair and Spiriva .  Since discharge he is feeling better with no increased dyspnea or edema.  No chest pain or hemoptysis.   Review of Systems Constitutional:   No  weight loss, night sweats,  Fevers, chills, fatigue, or  lassitude.  HEENT:   No headaches,  Difficulty swallowing,  Tooth/dental problems, or  Sore throat,                No sneezing, itching, ear ache, nasal congestion, post nasal drip,   CV:  No chest pain,  Orthopnea, PND,   anasarca, dizziness, palpitations, syncope.   GI  No heartburn, indigestion, abdominal pain, nausea, vomiting, diarrhea, change in bowel habits, loss of appetite, bloody stools.   Resp:   No coughing up of blood.   Marland Kitchen  No chest wall deformity  Skin: no rash or lesions.  GU: no dysuria, change in color of urine, no urgency or frequency.  No flank pain, no hematuria   MS:  No joint pain or swelling.  No decreased range of motion.  No back pain.  Psych:  No change in mood or affect. No depression or anxiety.  No memory loss.         Objective:   Physical Exam GEN: A/Ox3; pleasant , NAD,elderly   HEENT:  [place]/AT,  EACs-clear, TMs-wnl, NOSE-clear, THROAT-clear, no lesions, no postnasal drip or exudate noted.   NECK:  Supple w/ fair ROM; no JVD; normal carotid impulses w/o bruits; no  thyromegaly or nodules palpated; no lymphadenopathy.  RESP  Coarse BS  w/o, wheezes/ rales/ or rhonchi.no accessory muscle use, no dullness to percussion  CARD:  RRR, no m/r/g  , tr  peripheral edema, pulses intact, no cyanosis or clubbing.  GI:   Soft & nt; nml bowel sounds; no organomegaly or masses detected.  Musco: Warm bil, no deformities or joint swelling noted.   Neuro: alert, no focal deficits noted.    Skin: Warm, no lesions or rashes         Assessment & Plan:

## 2011-08-19 ENCOUNTER — Telehealth: Payer: Self-pay | Admitting: Pulmonary Disease

## 2011-08-19 DIAGNOSIS — J9600 Acute respiratory failure, unspecified whether with hypoxia or hypercapnia: Secondary | ICD-10-CM

## 2011-08-19 NOTE — Progress Notes (Signed)
Reviewed and agree with assessment/plan. 

## 2011-08-20 NOTE — Telephone Encounter (Signed)
Pt states [facility] told him he would have to pay 1400.00 and 500.00 a month i will ck into this Tobe Sos

## 2011-08-20 NOTE — Telephone Encounter (Signed)
I spoke with pt and is aware to go to [facility] to have the abg done and will have an ONO done. He was fine with this and also stated he is scheduled to see VS 09/16/11 w/ pft on same day and would like to keep that apt. Nothing further was needed

## 2011-08-20 NOTE — Telephone Encounter (Signed)
All of his qualifing  For his bipap is 2months old and he will have to requalify his pco2 needs to be greater than or equal to 52 and is has to be with 30day and his are over 2mos old and an ono on 02 with 88% sats 5 min or more do you want to order these test

## 2011-08-20 NOTE — Telephone Encounter (Signed)
Pt returned call. [phone]. Cody Faulkner

## 2011-08-20 NOTE — Telephone Encounter (Signed)
lmomtcb to make pt aware that he will need to check in at [facility] admitting and they will send him for the ABG.  [facility] will contact him about the ono.

## 2011-08-20 NOTE — Telephone Encounter (Signed)
Please confirm that patient is aware of need to have ABG done and  ONO on room air.

## 2011-08-20 NOTE — Telephone Encounter (Signed)
So send him for a ABG at [facility] on room air and set up for a ONO  Has ov with Dr. Craige Cotta  In 2-3 weeks can discuss results on return -

## 2011-08-21 ENCOUNTER — Telehealth: Payer: Self-pay | Admitting: Pulmonary Disease

## 2011-08-21 NOTE — Telephone Encounter (Signed)
I spoke with pt and advised him i do not see any phone note were we have tried calling him. He voiced his understanding and had no questions

## 2011-08-25 ENCOUNTER — Telehealth: Payer: Self-pay | Admitting: Pulmonary Disease

## 2011-08-25 NOTE — Telephone Encounter (Signed)
Spoke with pt and stressed importance of going to [facility] to have ABG drawn ASAP.  Also spoke with [place] at [facility] and they should be contacting pt today about setting time for ONO.  Spoke with Gavin Pound at [facility] and informed that we were waiting for these tests to be done before pt can get BIPAP.

## 2011-08-25 NOTE — Telephone Encounter (Signed)
Spoke with Cody Faulkner, she states calling to see why pt has not obtained BIPAP yet. I advised needs to have ONO done on RA and also ABG. She states pt did not know about either of these tests. Last phone note shows that we did tell him though.  I atc pt to ensure ABG was done, NA and no option given to leave msg, [facility]

## 2011-08-25 NOTE — Telephone Encounter (Signed)
@   9:25 am-Deborah Lafayette Dragon from [facility] called about getting a BiPap for pt.  Gavin Pound can be reached at [phone].  Antionette Fairy   (added from duplicate message)  Pt returned triage's call & stated he will try to call back.  Antionette Fairy

## 2011-08-25 NOTE — Telephone Encounter (Signed)
lmomtcb x1-- Pt was suppose to have ABG drawn so we can send this to [facility] so his insurance will cover the mask

## 2011-08-25 NOTE — Telephone Encounter (Signed)
Error.  Transferred to earlier message.  Cody Faulkner

## 2011-08-26 ENCOUNTER — Ambulatory Visit (HOSPITAL_COMMUNITY)
Admission: RE | Admit: 2011-08-26 | Discharge: 2011-08-26 | Disposition: A | Discharge status: Home / Self Care | Payer: Medicare Other | Source: Ambulatory Visit | Attending: Pulmonary Disease | Admitting: Pulmonary Disease

## 2011-08-26 DIAGNOSIS — J9600 Acute respiratory failure, unspecified whether with hypoxia or hypercapnia: Secondary | ICD-10-CM | POA: Insufficient documentation

## 2011-08-26 LAB — BLOOD GAS, ARTERIAL
Acid-Base Excess: 0.4 mmol/L (ref 0.0–2.0)
Drawn by: 101881
O2 Saturation: 95.1 %
Patient temperature: 37
TCO2: 21.3 mmol/L (ref 0–100)

## 2011-08-28 ENCOUNTER — Telehealth: Payer: Self-pay | Admitting: Pulmonary Disease

## 2011-08-28 NOTE — Telephone Encounter (Signed)
Spoke with pt. He states that he received a pulse oximeter in the mail with instructions to sleep with it on his finger and so he did this last night and someone from [facility] picked it back up earlier today. Sounds like they did an ONO? Pt wants to know if this was ordered by VS. I advised looks like what we order was a download on his BIPAP to be done. VS, did you want the pt to have an ONO done. Please advise, thanks!

## 2011-08-28 NOTE — Telephone Encounter (Signed)
He needs to have ONO.  Will call him with results.

## 2011-08-28 NOTE — Telephone Encounter (Signed)
LMTCB

## 2011-08-28 NOTE — Telephone Encounter (Signed)
lmomtcb x1 

## 2011-09-01 NOTE — Telephone Encounter (Signed)
Spoke with pt and notified that ONO will need to be repeated due to problem with the machine when this was done before. I advised that he should expect call from [facility]. Pt verbalized understanding and states nothing further needed.

## 2011-09-01 NOTE — Telephone Encounter (Signed)
Called [facility] to check on status of ONO results.  Spoke with Inetta Fermo who stated that she went to download the ONO results this morning but found that the machinery had a short in it - and defaulted the date back to 2001 > so the test appears to be greater than 49 days old and insurance will not use it because of this reason.  Per Inetta Fermo, [facility] will repeat the test tonight if pt can be reached.  LMOM TCB x2 to discuss this with patient and to inform him that VS will call with the results when available.

## 2011-09-16 ENCOUNTER — Ambulatory Visit (INDEPENDENT_AMBULATORY_CARE_PROVIDER_SITE_OTHER): Payer: Medicare Other | Admitting: Pulmonary Disease

## 2011-09-16 ENCOUNTER — Encounter: Payer: Self-pay | Admitting: Pulmonary Disease

## 2011-09-16 VITALS — BP 126/74 | HR 62 | Temp 98.2°F | Ht 67.0 in | Wt 254.0 lb

## 2011-09-16 DIAGNOSIS — J45909 Unspecified asthma, uncomplicated: Secondary | ICD-10-CM

## 2011-09-16 DIAGNOSIS — I5022 Chronic systolic (congestive) heart failure: Secondary | ICD-10-CM

## 2011-09-16 DIAGNOSIS — G4730 Sleep apnea, unspecified: Secondary | ICD-10-CM

## 2011-09-16 LAB — PULMONARY FUNCTION TEST

## 2011-09-16 NOTE — Patient Instructions (Signed)
Stop spiriva Continue advair one puff twice per day Continue albuterol two puffs as needed for cough, wheeze, or chest congestion Will schedule sleep study Follow up in 2 months

## 2011-09-16 NOTE — Assessment & Plan Note (Signed)
I suspect a good portion of his dyspnea is related to his heart failure and deconditioning.  I explained how untreated sleep apnea can impact his cardiac disease.

## 2011-09-16 NOTE — Assessment & Plan Note (Signed)
There is no history of smoking, and his PFT's today did not show airflow obstruction.  Will have him stop spiriva.  He is to continue advair and prn albuterol for now.  Will determine if he can have further decrease in his inhaler regimen at his next visit.

## 2011-09-16 NOTE — Progress Notes (Signed)
PFT done today. 

## 2011-09-16 NOTE — Progress Notes (Signed)
Chief Complaint  Patient presents with  . Follow-up    pft. Pt states his breathing unchanged. Pt has veryl little cough. denies any wheezing, chest tx. Pt does not have bpap machine    History of Present Illness: Cody Faulkner is a 66 y.o. male never smoker with dyspnea, possible asthma, and sleep apnea.  Has hx of systolic heart failure.  He is here to review his ONO, PFT, and ABG.  He is not sure if his inhalers help his breathing.  He feels his "yellow rescue" inhaler works the best.  He is not having much cough, wheeze, or sputum.  He gets winded with activity, but does okay at rest.   He did not qualify for BPAP on the basis of respiratory failure.  He was told he would have to pay $500 per month to get a machine.  He is very concerned about this.  He does snore, and has trouble with his breathing while asleep.  He also feels sleepy during the day.     Past Medical History  Diagnosis Date  . Atrial fibrillation     Cardioversion 2007  . Hypertension   . OSA (obstructive sleep apnea)        . Depression   . Gouty arthritis   . CAD (coronary artery disease)     predominantly single vessel  . Ventricular tachycardia     s/p defibrillator-Medtronic EnTrust [id number]  . Dilated cardiomyopathy     s/p defibrillator Medtronic EnTrust [phone]  . Asthma   . 9604 Lead   . Implantable Defibrillator     Medtronic Entrust  . Chronic systolic heart failure   . Hypothyroidism   . Acute renal failure 06/06/2011  . Solitary kidney 06/05/2006  . Gout   . CHF (congestive heart failure)   . Pneumonia, community acquired     bilateral bibasilar   . Shortness of breath on exertion     Past Surgical History  Procedure Date  . Cardiac defibrillator placement 2007    Medtronic EnTrust [phone]  . Nephrectomy 1976    Right  . Cardiac catheterization 2007    60% Stenosis mid-CFX  . Knee arthroscopy     left  . Cardioversion 06/18/2011    Procedure: CARDIOVERSION;  Surgeon: Darden Palmer., MD;  Location: [facility] OR;  Service: Cardiovascular;  Laterality: N/A;    No Known Allergies  Physical Exam:  Blood pressure 126/74, pulse 62, temperature 98.2 F (36.8 C), temperature source Oral, height 5\' 7"  (1.702 m), weight 254 lb (115.214 kg), SpO2 96.00%.  Body mass index is 39.78 kg/(m^2).  Wt Readings from Last 2 Encounters:  09/16/11 254 lb (115.214 kg)  08/18/11 208 lb 12.8 oz (94.711 kg)    General - Obese HEENT - no sinus tenderness, no oral exudate, no LAN Cardiac - s1s2 regular Chest - no wheeze, rales Abdomen - obese, soft Extremities - ankle edema Skin - no rashes Neurologic - normal strength Psychiatric - normal mood, behavior   PFT 09/16/11>>FEV1 2.05(80%), FEV1% 79, TLC 8.05(150%), DLCO 65%, no BD  ABG    Component Value Date/Time   PHART 7.442 08/26/2011 1038   PCO2ART 35.3 08/26/2011 1038   PO2ART 74.3* 08/26/2011 1038   HCO3 23.7 08/26/2011 1038   TCO2 21.3 08/26/2011 1038   ACIDBASEDEF 0.3 06/08/2011 0515   O2SAT 95.1 08/26/2011 1038    Room air ONO 09/03/11>>Test time 6 hrs 35 min.  Mean SpO2 89.9%, low SpO2 66%.  Spent 2 hrs  3 min with SpO2 < 88%.  Assessment/Plan:  Outpatient Encounter Prescriptions as of 09/16/2011  Medication Sig Dispense Refill  . acetaminophen (TYLENOL EX ST ARTHRITIS PAIN) 500 MG tablet Take 500 mg by mouth every 6 (six) hours as needed. For pain       . albuterol (PROVENTIL HFA;VENTOLIN HFA) 108 (90 BASE) MCG/ACT inhaler Inhale 2 puffs into the lungs every 4 (four) hours as needed. For shortness of breath       . allopurinol (ZYLOPRIM) 300 MG tablet Take 300 mg by mouth daily.       Marland Kitchen aspirin 81 MG chewable tablet Chew 81 mg by mouth daily.        . bisoprolol (ZEBETA) 5 MG tablet Take 1 tablet (5 mg total) by mouth daily.  30 tablet  0  . Fluticasone-Salmeterol (ADVAIR DISKUS) 250-50 MCG/DOSE AEPB Inhale 1 puff into the lungs every 12 (twelve) hours.      . food thickener (RESOURCE THICKENUP CLEAR) POWD Take 1  scoop by mouth as needed (for nectar thick diet).  120 g  0  . furosemide (LASIX) 40 MG tablet Take 1 tablet (40 mg total) by mouth every other day.  30 tablet  0  . hydrALAZINE (APRESOLINE) 25 MG tablet Take 25 mg by mouth 3 (three) times daily.      Marland Kitchen levothyroxine (SYNTHROID, LEVOTHROID) 100 MCG tablet Take 100 mcg by mouth daily.       Marland Kitchen loratadine (CLARITIN) 10 MG tablet Take 10 mg by mouth daily.        . potassium chloride SA (K-DUR,KLOR-CON) 20 MEQ tablet Take 1 tablet (20 mEq total) by mouth every other day.  30 tablet  0  . predniSONE (DELTASONE) 20 MG tablet Take 5 mg by mouth daily.       Marland Kitchen warfarin (COUMADIN) 5 MG tablet Take 0.5 tablets (2.5 mg total) by mouth daily.  10 tablet  0  . DISCONTD: tiotropium (SPIRIVA HANDIHALER) 18 MCG inhalation capsule Place 1 capsule (18 mcg total) into inhaler and inhale daily.  30 capsule  12    Yasir Kitner Pager:  [phone] 09/16/2011, 5:53 PM

## 2011-09-16 NOTE — Assessment & Plan Note (Addendum)
His overnight oximetry is very suggestive of sleep apnea.  He reports snoring, breathing difficulties while asleep, and daytime sleepiness.  He has a history of congestive heart failure.  He will need to have a repeat sleep study to further assess.

## 2011-10-06 ENCOUNTER — Encounter: Payer: Self-pay | Admitting: Pulmonary Disease

## 2011-10-06 ENCOUNTER — Ambulatory Visit (HOSPITAL_BASED_OUTPATIENT_CLINIC_OR_DEPARTMENT_OTHER): Payer: Medicare Other | Attending: Pulmonary Disease

## 2011-10-06 VITALS — Ht 65.0 in | Wt 255.0 lb

## 2011-10-06 DIAGNOSIS — I5022 Chronic systolic (congestive) heart failure: Secondary | ICD-10-CM | POA: Insufficient documentation

## 2011-10-06 DIAGNOSIS — I10 Essential (primary) hypertension: Secondary | ICD-10-CM | POA: Insufficient documentation

## 2011-10-06 DIAGNOSIS — I4891 Unspecified atrial fibrillation: Secondary | ICD-10-CM | POA: Insufficient documentation

## 2011-10-06 DIAGNOSIS — G4733 Obstructive sleep apnea (adult) (pediatric): Secondary | ICD-10-CM | POA: Insufficient documentation

## 2011-10-06 DIAGNOSIS — G4730 Sleep apnea, unspecified: Secondary | ICD-10-CM

## 2011-10-06 DIAGNOSIS — Z7982 Long term (current) use of aspirin: Secondary | ICD-10-CM | POA: Insufficient documentation

## 2011-10-06 DIAGNOSIS — I2510 Atherosclerotic heart disease of native coronary artery without angina pectoris: Secondary | ICD-10-CM | POA: Insufficient documentation

## 2011-10-06 DIAGNOSIS — Z9989 Dependence on other enabling machines and devices: Secondary | ICD-10-CM

## 2011-10-06 DIAGNOSIS — Z7901 Long term (current) use of anticoagulants: Secondary | ICD-10-CM | POA: Insufficient documentation

## 2011-10-06 DIAGNOSIS — Z79899 Other long term (current) drug therapy: Secondary | ICD-10-CM | POA: Insufficient documentation

## 2011-10-16 DIAGNOSIS — Z7901 Long term (current) use of anticoagulants: Secondary | ICD-10-CM

## 2011-10-16 DIAGNOSIS — I5022 Chronic systolic (congestive) heart failure: Secondary | ICD-10-CM

## 2011-10-16 DIAGNOSIS — G4733 Obstructive sleep apnea (adult) (pediatric): Secondary | ICD-10-CM

## 2011-10-16 DIAGNOSIS — Z79899 Other long term (current) drug therapy: Secondary | ICD-10-CM

## 2011-10-16 DIAGNOSIS — I4891 Unspecified atrial fibrillation: Secondary | ICD-10-CM

## 2011-10-16 DIAGNOSIS — I10 Essential (primary) hypertension: Secondary | ICD-10-CM

## 2011-10-16 DIAGNOSIS — I2510 Atherosclerotic heart disease of native coronary artery without angina pectoris: Secondary | ICD-10-CM

## 2011-10-16 DIAGNOSIS — Z7982 Long term (current) use of aspirin: Secondary | ICD-10-CM

## 2011-10-16 NOTE — Procedures (Signed)
NAME:  Cody Faulkner, Cody Faulkner NO.:  [id number]  MEDICAL RECORD NO.:  [id number]          PATIENT TYPE:  OUT  LOCATION:  SLEEP CENTER                 FACILITY:  [facility]  PHYSICIAN:  Coralyn Helling, MD        DATE OF BIRTH:  30-Dec-1945  DATE OF STUDY:  10/06/2011                           NOCTURNAL POLYSOMNOGRAM  REFERRING PHYSICIAN:  Coralyn Helling, MD  REFERRING PHYSICIAN:  Coralyn Helling, MD.  INDICATION FOR STUDY:  Mr. Tanguma is a 66 year old male, who has a history of coronary artery disease, systolic heart failure, atrial fibrillation, and hypertension.  He also has a previous diagnosis of obstructive sleep apnea, but had difficulty tolerating CPAP therapy.  He continues to have snoring, sleep disruption, and daytime sleepiness.  He is referred to the sleep lab for evaluation of hypersomnia with obstructive sleep apnea.  Height is 5 feet 5 inches, weight is 255 pounds, BMI is 42. Neck size is 20 inches.  EPWORTH SLEEPINESS SCORE:  Seventeen.  MEDICATIONS:  Albuterol, aspirin, bisoprolol, prednisone, loratadine, levothyroxine, warfarin, allopurinol, hydralazine, acetaminophen, potassium chloride, and furosemide.  SLEEP ARCHITECTURE:  The patient followed a split night study protocol. During the diagnostic portion of the study, total recording time was 168 minutes, total sleep time was 119 minutes. Sleep efficiency was 71%. Sleep latency was 10 minutes.  This portion of the study was notable for the lack of slow-wave sleep and REM sleep and he slept in both the supine and nonsupine positions.  During the titration portion of the study, total recording time was 256 minutes, total sleep time was 195 minutes. Sleep efficiency was 76%. Sleep latency was 1 minute. REM latency was 48 minutes.  This portion of the study was notable for the lack of slow-wave sleep and he slept predominantly in the supine position.  RESPIRATORY DATA:  The average respiratory rate was 20.  Loud  snoring was noted by the technician.  The overall apnea/hypopnea index was 87.4. There was one central apneic event, one mixed respiratory event, and the remainder of the events were obstructive in nature.  During the titration portion of the study, the patient was started on CPAP of 4 and increased to 14 cm of water.  He was then transitioned to BiPAP, starting at 16/12 and increased to 21/17.  It appeared that with CPAP of 11 cm of water, his obstructive respiratory events were reasonably controlled. With higher pressure settings, he appeared to develop more frequent central apneic events.  OXYGEN DATA:  The baseline oxygenation was 93%.  The oxygen saturation nadir was 72%.  The patient continued to have oxygen desaturations in spite of CPAP and BiPAP therapy, which again appeared to be mostly related to central apneic events.  The study was conducted without the use of supplemental oxygen.  CARDIAC DATA:  The average heart rate was 56 and the rhythm strip showed sinus rhythm with PVCs.  MOVEMENT-PARASOMNIA:  The periodic limb movement index was zero.  The patient had no restroom trips.  He did have episodes of sleep talking and also had difficulty keeping his mask on with higher pressure settings.  IMPRESSIONS-RECOMMENDATIONS:  This study shows evidence for a severe  obstructive sleep apnea with an overall apnea/hypopnea index of 87.4 and oxygen saturation nadir of 72%.  This was a suboptimal titration portion of the study.  In spite of trying both continuous positive airway pressure and bilevel positive airway pressure therapy, he continued to have respiratory events.  Of note is that he appeared to have most optimal control of his obstructive sleep apnea with continuous positive airway pressure of 11 cm of water. However, at higher pressures, he had significant central apneic events, which were not controlled with increase in continuous positive airway pressure or transition  to bilevel positive airway pressure.  I would recommend the patient undergo counseling regarding the importance of diet, excise, and weight reduction.  Option at this point would be to start the patient on continuous positive airway pressure of 11 cm of water with 1 L of supplemental oxygen.  He could then undergo an overnight oximetry to determine if he requires additional adjustments in this setup.  Alternatively, he could return to the sleep lab for a full night titration study.  If this is the case, I would recommend that he start on CPAP of 10 cm of water with 1 L of supplemental oxygen and adjust the settings as needed.     Coralyn Helling, MD Diplomat, American Board of Sleep Medicine    VS/MEDQ  D:  10/15/2011 14:43:39  T:  10/16/2011 03:06:02  Job:  [id number]

## 2011-10-19 ENCOUNTER — Telehealth: Payer: Self-pay | Admitting: Pulmonary Disease

## 2011-10-19 DIAGNOSIS — G4733 Obstructive sleep apnea (adult) (pediatric): Secondary | ICD-10-CM

## 2011-10-19 NOTE — Telephone Encounter (Signed)
Split night study 10/06/11>>AHI 87.4, SpO2 low 72%.  Sub-optimal titration.  Centrals with higher CPAP/BPAP settings.  CPAP 11 cm H2O seemed best for OSA.  Left message on patient's voicemail.  Asked him to call back to leave number he can be reached at.  Options at this time are:  1) Start CPAP 11 cm H2O with 1 liter oxygen, and then arrange for CPAP download and ONO with further adjustments based on these results.  2) Return to sleep lab for full night titration starting with CPAP 10 cm H2O and 1 liter oxygen and then adjust settings as needed.  This is best option if patient is agreeable to return to sleep lab.  Will forward to my nurse to ensure that patient has received phone message.

## 2011-10-19 NOTE — Telephone Encounter (Signed)
Pt returned VS's call.  Pt can be reached at [phone].  Pt stated he will try back this afternoon or tomorrow morning.    Antionette Fairy

## 2011-10-20 NOTE — Telephone Encounter (Signed)
Per VS he will call patient. Will forward to him

## 2011-10-20 NOTE — Telephone Encounter (Signed)
Attempted to call both listed numbers, but no answer.    Will have my nurse schedule ROV to review sleep test results.

## 2011-10-20 NOTE — Telephone Encounter (Signed)
Pt called back in for VS.  Pt can be reached at [phone].  Cody Faulkner

## 2011-10-22 NOTE — Telephone Encounter (Signed)
lmomtcb x1 for pt 

## 2011-10-26 NOTE — Telephone Encounter (Signed)
I spoke with pt and is scheduled to come in may 29 at 1:30

## 2011-10-26 NOTE — Telephone Encounter (Signed)
Pt returned call & can be reached at [phone].  Cody Faulkner

## 2011-11-04 ENCOUNTER — Encounter: Payer: Self-pay | Admitting: Pulmonary Disease

## 2011-11-04 ENCOUNTER — Ambulatory Visit (INDEPENDENT_AMBULATORY_CARE_PROVIDER_SITE_OTHER): Payer: Medicare Other | Admitting: Pulmonary Disease

## 2011-11-04 VITALS — BP 140/88 | HR 59 | Temp 98.0°F | Ht 65.0 in | Wt 256.2 lb

## 2011-11-04 DIAGNOSIS — J45909 Unspecified asthma, uncomplicated: Secondary | ICD-10-CM

## 2011-11-04 DIAGNOSIS — G4739 Other sleep apnea: Secondary | ICD-10-CM

## 2011-11-04 DIAGNOSIS — G4730 Sleep apnea, unspecified: Secondary | ICD-10-CM

## 2011-11-04 DIAGNOSIS — G4731 Primary central sleep apnea: Secondary | ICD-10-CM

## 2011-11-04 NOTE — Patient Instructions (Signed)
Follow up in one month.

## 2011-11-04 NOTE — Assessment & Plan Note (Signed)
Stable on his inhaler regimen. 

## 2011-11-04 NOTE — Progress Notes (Signed)
Chief Complaint  Patient presents with  . Sleep Apnea    pt here to discuss sleep study .    History of Present Illness: Cody Faulkner is a 66 y.o. male with OSA, and asthma.  Split night study 10/06/11>>AHI 87.4, SpO2 low 72%. Sub-optimal titration. Centrals with higher CPAP/BPAP settings. CPAP 11 cm H2O seemed best for OSA.  His breathing is doing okay.   Past Medical History  Diagnosis Date  . Atrial fibrillation     Cardioversion 2007  . Hypertension   . OSA (obstructive sleep apnea)        . Depression   . Gouty arthritis   . CAD (coronary artery disease)     predominantly single vessel  . Ventricular tachycardia     s/p defibrillator-Medtronic EnTrust D154  . Dilated cardiomyopathy     s/p defibrillator Medtronic EnTrust [phone]  . Asthma   . 9604 Lead   . Implantable Defibrillator     Medtronic Entrust  . Chronic systolic heart failure   . Hypothyroidism   . Acute renal failure 06/06/2011  . Solitary kidney 06/05/2006  . Gout   . CHF (congestive heart failure)   . Pneumonia, community acquired     bilateral bibasilar   . Shortness of breath on exertion     Past Surgical History  Procedure Date  . Cardiac defibrillator placement 2007    Medtronic EnTrust [phone]  . Nephrectomy 1976    Right  . Cardiac catheterization 2007    60% Stenosis mid-CFX  . Knee arthroscopy     left  . Cardioversion 06/18/2011    Procedure: CARDIOVERSION;  Surgeon: Darden Palmer., MD;  Location: [facility] OR;  Service: Cardiovascular;  Laterality: N/A;    No Known Allergies  Physical Exam:  Blood pressure 140/88, pulse 59, temperature 98 F (36.7 C), temperature source Oral, height 5\' 5"  (1.651 m), weight 256 lb 3.2 oz (116.212 kg), SpO2 95.00%. Body mass index is 42.63 kg/(m^2). Wt Readings from Last 2 Encounters:  11/04/11 256 lb 3.2 oz (116.212 kg)  10/06/11 255 lb (115.667 kg)    General - Obese  HEENT - no sinus tenderness, no oral exudate, no LAN  Cardiac - s1s2  regular  Chest - no wheeze, rales  Abdomen - obese, soft  Extremities - ankle edema  Skin - no rashes  Neurologic - normal strength  Psychiatric - normal mood, behavior   Assessment/Plan:  Outpatient Encounter Prescriptions as of 11/04/2011  Medication Sig Dispense Refill  . acetaminophen (TYLENOL EX ST ARTHRITIS PAIN) 500 MG tablet Take 500 mg by mouth every 6 (six) hours as needed. For pain       . albuterol (PROVENTIL HFA;VENTOLIN HFA) 108 (90 BASE) MCG/ACT inhaler Inhale 2 puffs into the lungs every 4 (four) hours as needed. For shortness of breath       . allopurinol (ZYLOPRIM) 300 MG tablet Take 300 mg by mouth daily.       Marland Kitchen aspirin 81 MG chewable tablet Chew 81 mg by mouth daily.        . bisoprolol (ZEBETA) 5 MG tablet Take 1 tablet (5 mg total) by mouth daily.  30 tablet  0  . Fluticasone-Salmeterol (ADVAIR DISKUS) 250-50 MCG/DOSE AEPB Inhale 1 puff into the lungs every 12 (twelve) hours.      . furosemide (LASIX) 40 MG tablet Take 1 tablet (40 mg total) by mouth every other day.  30 tablet  0  . hydrALAZINE (APRESOLINE) 25 MG tablet  Take 25 mg by mouth 3 (three) times daily.      Marland Kitchen levothyroxine (SYNTHROID, LEVOTHROID) 100 MCG tablet Take 100 mcg by mouth daily.       Marland Kitchen loratadine (CLARITIN) 10 MG tablet Take 10 mg by mouth daily.        . potassium chloride SA (K-DUR,KLOR-CON) 20 MEQ tablet Take 1 tablet (20 mEq total) by mouth every other day.  30 tablet  0  . warfarin (COUMADIN) 5 MG tablet Take 0.5 tablets (2.5 mg total) by mouth daily.  10 tablet  0  . food thickener (RESOURCE THICKENUP CLEAR) POWD Take 1 scoop by mouth as needed (for nectar thick diet).  120 g  0  . predniSONE (DELTASONE) 20 MG tablet Take 5 mg by mouth daily.         Cody Faulkner Pager:  [phone] 11/04/2011, 2:15 PM

## 2011-11-04 NOTE — Assessment & Plan Note (Signed)
He has severe complex sleep apnea.    I have reviewed his sleep test results with the patient.  Explained how sleep apnea can affect the patient's health.  Driving precautions and importance of weight loss were discussed.  Treatment options for sleep apnea were reviewed.  He is concerned about the expense of therapy.  He would like to d/w his son before proceeding with therapy.

## 2011-11-18 ENCOUNTER — Ambulatory Visit: Payer: Medicare Other | Admitting: Pulmonary Disease

## 2011-12-09 ENCOUNTER — Ambulatory Visit (INDEPENDENT_AMBULATORY_CARE_PROVIDER_SITE_OTHER): Payer: Medicare Other | Admitting: Pulmonary Disease

## 2011-12-09 ENCOUNTER — Encounter: Payer: Self-pay | Admitting: Pulmonary Disease

## 2011-12-09 VITALS — BP 122/90 | HR 99 | Temp 98.4°F | Ht 66.0 in | Wt 260.8 lb

## 2011-12-09 DIAGNOSIS — J45909 Unspecified asthma, uncomplicated: Secondary | ICD-10-CM

## 2011-12-09 DIAGNOSIS — G4731 Primary central sleep apnea: Secondary | ICD-10-CM

## 2011-12-09 DIAGNOSIS — J4530 Mild persistent asthma, uncomplicated: Secondary | ICD-10-CM

## 2011-12-09 DIAGNOSIS — G4730 Sleep apnea, unspecified: Secondary | ICD-10-CM

## 2011-12-09 NOTE — Assessment & Plan Note (Signed)
Stable on inhaler regimen.  Will continue advair and prn albuterol.  Have given him samples of advair.

## 2011-12-09 NOTE — Assessment & Plan Note (Signed)
He has severe complex sleep apnea.    He is concerned about the expense of therapy.  I explained that he could be assessed for financial assistance for his CPAP set up.  He can't afford any additional expenses, and did not want to pursue CPAP set up.  I again emphasized how sleep apnea can affect his health.

## 2011-12-09 NOTE — Progress Notes (Signed)
Chief Complaint  Patient presents with  . Follow-up    breathing has been good. Has occaisonal cough. denies any wheezing, chest tx  . Sleep Apnea    Pt states he does not have the money to afford cpap machine    History of Present Illness: Cody Faulkner is a 66 y.o. male with OSA, and asthma.  His breathing is doing okay.  He does not have much cough, wheeze, or sputum.  He uses albuterol once per week at most.  He feels advair helps his breathing.  Past Medical History  Diagnosis Date  . Atrial fibrillation     Cardioversion 2007  . Hypertension   . OSA (obstructive sleep apnea)        . Depression   . Gouty arthritis   . CAD (coronary artery disease)     predominantly single vessel  . Ventricular tachycardia     s/p defibrillator-Medtronic EnTrust D154  . Dilated cardiomyopathy     s/p defibrillator Medtronic EnTrust [phone]  . Asthma   . 9604 Lead   . Implantable Defibrillator     Medtronic Entrust  . Chronic systolic heart failure   . Hypothyroidism   . Acute renal failure 06/06/2011  . Solitary kidney 06/05/2006  . Gout   . CHF (congestive heart failure)   . Pneumonia, community acquired     bilateral bibasilar   . Shortness of breath on exertion     Past Surgical History  Procedure Date  . Cardiac defibrillator placement 2007    Medtronic EnTrust [phone]  . Nephrectomy 1976    Right  . Cardiac catheterization 2007    60% Stenosis mid-CFX  . Knee arthroscopy     left  . Cardioversion 06/18/2011    Procedure: CARDIOVERSION;  Surgeon: Darden Palmer., MD;  Location: [facility] OR;  Service: Cardiovascular;  Laterality: N/A;    No Known Allergies  Physical Exam:  Blood pressure 122/90, pulse 99, temperature 98.4 F (36.9 C), temperature source Oral, height 5\' 6"  (1.676 m), weight 260 lb 12.8 oz (118.298 kg), SpO2 95.00%. Body mass index is 42.09 kg/(m^2). Wt Readings from Last 2 Encounters:  12/09/11 260 lb 12.8 oz (118.298 kg)  11/04/11 256 lb 3.2 oz  (116.212 kg)    General - Obese  HEENT - no sinus tenderness, no oral exudate, no LAN  Cardiac - s1s2 regular  Chest - no wheeze, rales  Abdomen - obese, soft  Extremities - ankle edema  Skin - no rashes  Neurologic - normal strength  Psychiatric - normal mood, behavior   Assessment/Plan:  Outpatient Encounter Prescriptions as of 12/09/2011  Medication Sig Dispense Refill  . acetaminophen (TYLENOL EX ST ARTHRITIS PAIN) 500 MG tablet Take 500 mg by mouth every 6 (six) hours as needed. For pain       . albuterol (PROVENTIL HFA;VENTOLIN HFA) 108 (90 BASE) MCG/ACT inhaler Inhale 2 puffs into the lungs every 4 (four) hours as needed. For shortness of breath       . allopurinol (ZYLOPRIM) 300 MG tablet Take 300 mg by mouth daily.       Marland Kitchen aspirin 81 MG chewable tablet Chew 81 mg by mouth daily.        . bisoprolol (ZEBETA) 5 MG tablet Take 1 tablet (5 mg total) by mouth daily.  30 tablet  0  . Fluticasone-Salmeterol (ADVAIR DISKUS) 250-50 MCG/DOSE AEPB Inhale 1 puff into the lungs every 12 (twelve) hours.      . food thickener (  RESOURCE THICKENUP CLEAR) POWD Take 1 scoop by mouth as needed (for nectar thick diet).  120 g  0  . furosemide (LASIX) 40 MG tablet Take 1 tablet (40 mg total) by mouth every other day.  30 tablet  0  . hydrALAZINE (APRESOLINE) 25 MG tablet Take 25 mg by mouth 3 (three) times daily.      Marland Kitchen levothyroxine (SYNTHROID, LEVOTHROID) 100 MCG tablet Take 100 mcg by mouth daily.       Marland Kitchen loratadine (CLARITIN) 10 MG tablet Take 10 mg by mouth daily.        . potassium chloride SA (K-DUR,KLOR-CON) 20 MEQ tablet Take 1 tablet (20 mEq total) by mouth every other day.  30 tablet  0  . predniSONE (DELTASONE) 20 MG tablet Take 5 mg by mouth daily.       Marland Kitchen warfarin (COUMADIN) 5 MG tablet Take 0.5 tablets (2.5 mg total) by mouth daily.  10 tablet  0    Cody Faulkner Pager:  [phone] 12/09/2011, 1:58 PM

## 2011-12-09 NOTE — Patient Instructions (Signed)
Follow up in 6 months 

## 2011-12-17 ENCOUNTER — Emergency Department (HOSPITAL_COMMUNITY)
Admission: EM | Admit: 2011-12-17 | Discharge: 2011-12-17 | Disposition: A | Discharge status: Home / Self Care | Payer: Medicare Other | Attending: Emergency Medicine | Admitting: Emergency Medicine

## 2011-12-17 ENCOUNTER — Encounter (HOSPITAL_COMMUNITY): Payer: Self-pay | Admitting: Emergency Medicine

## 2011-12-17 DIAGNOSIS — I428 Other cardiomyopathies: Secondary | ICD-10-CM | POA: Insufficient documentation

## 2011-12-17 DIAGNOSIS — I5022 Chronic systolic (congestive) heart failure: Secondary | ICD-10-CM | POA: Insufficient documentation

## 2011-12-17 DIAGNOSIS — I509 Heart failure, unspecified: Secondary | ICD-10-CM | POA: Insufficient documentation

## 2011-12-17 DIAGNOSIS — E039 Hypothyroidism, unspecified: Secondary | ICD-10-CM | POA: Insufficient documentation

## 2011-12-17 DIAGNOSIS — I4891 Unspecified atrial fibrillation: Secondary | ICD-10-CM | POA: Insufficient documentation

## 2011-12-17 DIAGNOSIS — I2510 Atherosclerotic heart disease of native coronary artery without angina pectoris: Secondary | ICD-10-CM | POA: Insufficient documentation

## 2011-12-17 DIAGNOSIS — R21 Rash and other nonspecific skin eruption: Secondary | ICD-10-CM

## 2011-12-17 DIAGNOSIS — I10 Essential (primary) hypertension: Secondary | ICD-10-CM | POA: Insufficient documentation

## 2011-12-17 DIAGNOSIS — Z95810 Presence of automatic (implantable) cardiac defibrillator: Secondary | ICD-10-CM | POA: Insufficient documentation

## 2011-12-17 LAB — PROTIME-INR
INR: 1.64 — ABNORMAL HIGH (ref 0.00–1.49)
Prothrombin Time: 19.7 seconds — ABNORMAL HIGH (ref 11.6–15.2)

## 2011-12-17 MED ORDER — DIPHENHYDRAMINE HCL 25 MG PO CAPS
25.0000 mg | ORAL_CAPSULE | Freq: Once | ORAL | Status: AC
  Administered 2011-12-17: 25 mg via ORAL
  Filled 2011-12-17: qty 1

## 2011-12-17 MED ORDER — CEPHALEXIN 500 MG PO CAPS: 500.0000 mg | ORAL_CAPSULE | Freq: Four times a day (QID) | ORAL | Status: AC

## 2011-12-17 NOTE — ED Notes (Signed)
Pt c/o itchy rash with whelps on arms and legs x 4 days; pt sts thinks it could be from methimazole which has been on 2 months but just had increased; pt denies any new SOB; pt with bruising to arms from scratching and being on blood thinners

## 2011-12-17 NOTE — ED Provider Notes (Signed)
History   This chart was scribed for Raeford Razor, MD by Charolett Bumpers . The patient was seen in room TR09C/TR09C.    CSN: [id number]  Arrival date & time 12/17/11  1751   First MD Initiated Contact with Patient 12/17/11 1815      Chief Complaint  Patient presents with  . Rash    (Consider location/radiation/quality/duration/timing/severity/associated sxs/prior treatment) HPI Cody Faulkner is a 66 y.o. male who presents to the Emergency Department complaining of constant, moderate, rash that started a week ago. Pt reports associated itchy, whelps on arms. Pt also reports bruising to his arms from scratching. Pt states that his symptoms are worsening. Patient states that he has been on Methimazole for the past 2 months, with the dosage increased about 2 weeks ago. Pt denies any fevers or chills, nausea, any SOB or blood in stool or tarry stools. Pt reports that he takes Coumadin. Pt denies any other recent changes in his medications or taking any new OTC medication. Patient states that his Coumadin levels were checked 3 days ago, and states that his "blood was thick".   Cardiologist: Dr. Donnie Aho  Past Medical History  Diagnosis Date  . Atrial fibrillation     Cardioversion 2007  . Hypertension   . OSA (obstructive sleep apnea)        . Depression   . Gouty arthritis   . CAD (coronary artery disease)     predominantly single vessel  . Ventricular tachycardia     s/p defibrillator-Medtronic EnTrust D154  . Dilated cardiomyopathy     s/p defibrillator Medtronic EnTrust [phone]  . Asthma   . 1191 Lead   . Implantable Defibrillator     Medtronic Entrust  . Chronic systolic heart failure   . Hypothyroidism   . Acute renal failure 06/06/2011  . Solitary kidney 06/05/2006  . Gout   . CHF (congestive heart failure)   . Pneumonia, community acquired     bilateral bibasilar   . Shortness of breath on exertion     Past Surgical History  Procedure Date  . Cardiac  defibrillator placement 2007    Medtronic EnTrust [phone]  . Nephrectomy 1976    Right  . Cardiac catheterization 2007    60% Stenosis mid-CFX  . Knee arthroscopy     left  . Cardioversion 06/18/2011    Procedure: CARDIOVERSION;  Surgeon: Darden Palmer., MD;  Location: [facility] OR;  Service: Cardiovascular;  Laterality: N/A;    Family History  Problem Relation Age of Onset  . Heart disease Other     uncle  . Diabetes Other     uncle    History  Substance Use Topics  . Smoking status: Never Smoker   . Smokeless tobacco: Never Used  . Alcohol Use: Yes     06/17/11 "aien't drank nothing for 4 years now"      Review of Systems  Constitutional: Negative for fever and chills.  Respiratory: Negative for shortness of breath.   Gastrointestinal: Negative for nausea and blood in stool.  Skin: Positive for rash.    Allergies  Methimazole  Home Medications   Current Outpatient Rx  Name Route Sig Dispense Refill  . ALBUTEROL SULFATE HFA 108 (90 BASE) MCG/ACT IN AERS Inhalation Inhale 2 puffs into the lungs every 4 (four) hours as needed. For shortness of breath     . ALLOPURINOL 300 MG PO TABS Oral Take 300 mg by mouth daily.     . ASPIRIN 81  MG PO CHEW Oral Chew 81 mg by mouth daily.      Marland Kitchen BISOPROLOL FUMARATE 5 MG PO TABS Oral Take 5 mg by mouth daily.    Marland Kitchen FLUTICASONE-SALMETEROL 250-50 MCG/DOSE IN AEPB Inhalation Inhale 1 puff into the lungs every 12 (twelve) hours.    . FUROSEMIDE 40 MG PO TABS Oral Take 40 mg by mouth daily.    Marland Kitchen HYDRALAZINE HCL 25 MG PO TABS Oral Take 25 mg by mouth 3 (three) times daily.    Marland Kitchen LORATADINE 10 MG PO TABS Oral Take 10 mg by mouth every morning.     Marland Kitchen METHIMAZOLE 10 MG PO TABS Oral Take 10 mg by mouth 2 (two) times daily.    Marland Kitchen POTASSIUM CHLORIDE CRYS ER 20 MEQ PO TBCR Oral Take 20 mEq by mouth every other day.    . WARFARIN SODIUM 5 MG PO TABS Oral Take 5-7.5 mg by mouth every morning. Takes 1.5 tab (7.5mg ) 4 days a week & take 1 tab (5mg ) on all  other days      BP 169/91  Pulse 54  Temp 98.8 F (37.1 C)  Resp 20  SpO2 94%  Physical Exam  Nursing note and vitals reviewed. Constitutional: He is oriented to person, place, and time. He appears well-developed and well-nourished. No distress.  HENT:  Head: Normocephalic and atraumatic.  Eyes: Conjunctivae and EOM are normal. Pupils are equal, round, and reactive to light.       Conjunctiva are not pale.   Neck: Neck supple. No tracheal deviation present.  Cardiovascular: Normal rate, regular rhythm and normal heart sounds.   Pulmonary/Chest: Effort normal and breath sounds normal. No respiratory distress. He has no wheezes.  Abdominal: Soft. Bowel sounds are normal. He exhibits no distension. There is no tenderness.  Musculoskeletal: Normal range of motion.  Neurological: He is alert and oriented to person, place, and time.       Neurovascularly intact distally.   Skin: Skin is warm and dry. Rash noted.       Scattered ecchymosis to bilaterally upper extremities. Well defined erythematous rash to forearms with slight increase in warmth. Rash is non-tender and extends from upper elbow to distal forearm.   Psychiatric: He has a normal mood and affect. His behavior is normal.    ED Course  Procedures (including critical care time)  DIAGNOSTIC STUDIES: Oxygen Saturation is 94% on room air, adequate by my interpretation.    COORDINATION OF CARE:  1835: Discussed planned course of treatment with the patient, who is agreeable at this time.  1845: Medication Orders: Diphenhydramine (Benadryl) capsule 25 mg-once.    Labs Reviewed - No data to display No results found.   1. Rash       MDM  66 year old male with a rash to the bilateral upper extremities. This may be a side effect of his methimazole. Also consider infectious, but I feel that this is less likely. Given patient's multiple medical morbidities it is concerning enough that will treat for possible cellulitis.  Patient has self taken himself off methimazole. Discussed with patient that he needs to discuss with her prescribing physician as soon as he can. Does not appear to be thyrotoxic at this time.. I feel the patient is safe for discharge. Return precautions were discussed. Outpatient followup.   I personally preformed the services scribed in my presence. The recorded information has been reviewed and considered. Raeford Razor, MD.       Raeford Razor, MD 12/20/11 [phone]

## 2012-06-17 ENCOUNTER — Ambulatory Visit (INDEPENDENT_AMBULATORY_CARE_PROVIDER_SITE_OTHER): Payer: Medicare Other | Admitting: Internal Medicine

## 2012-06-17 ENCOUNTER — Encounter: Payer: Self-pay | Admitting: Internal Medicine

## 2012-06-17 VITALS — BP 160/100 | HR 81 | Ht 65.0 in | Wt 269.4 lb

## 2012-06-17 DIAGNOSIS — G4730 Sleep apnea, unspecified: Secondary | ICD-10-CM

## 2012-06-17 DIAGNOSIS — T82198A Other mechanical complication of other cardiac electronic device, initial encounter: Secondary | ICD-10-CM

## 2012-06-17 DIAGNOSIS — I5022 Chronic systolic (congestive) heart failure: Secondary | ICD-10-CM

## 2012-06-17 DIAGNOSIS — I472 Ventricular tachycardia, unspecified: Secondary | ICD-10-CM

## 2012-06-17 DIAGNOSIS — G4731 Primary central sleep apnea: Secondary | ICD-10-CM

## 2012-06-17 DIAGNOSIS — I428 Other cardiomyopathies: Secondary | ICD-10-CM

## 2012-06-17 DIAGNOSIS — I4891 Unspecified atrial fibrillation: Secondary | ICD-10-CM

## 2012-06-17 DIAGNOSIS — I420 Dilated cardiomyopathy: Secondary | ICD-10-CM

## 2012-06-17 DIAGNOSIS — Z95810 Presence of automatic (implantable) cardiac defibrillator: Secondary | ICD-10-CM

## 2012-06-17 DIAGNOSIS — I2510 Atherosclerotic heart disease of native coronary artery without angina pectoris: Secondary | ICD-10-CM

## 2012-06-17 LAB — ICD DEVICE OBSERVATION
ATRIAL PACING ICD: 40.3 pct
BAMS-0001: 170 {beats}/min
BATTERY VOLTAGE: 2.61 V
BRDY-0002RV: 50 {beats}/min
BRDY-0003RV: 130 {beats}/min
BRDY-0004RV: 130 {beats}/min
FVT: 1
RV LEAD AMPLITUDE: 12.1715 mv
RV LEAD IMPEDENCE ICD: 432 Ohm
TOT-0006: 20070606000000
TZAT-0001ATACH: 1
TZAT-0001ATACH: 2
TZAT-0001FASTVT: 1
TZAT-0001SLOWVT: 1
TZAT-0001SLOWVT: 2
TZAT-0002ATACH: NEGATIVE
TZAT-0004FASTVT: 8
TZAT-0004SLOWVT: 8
TZAT-0004SLOWVT: 8
TZAT-0005SLOWVT: 88 pct
TZAT-0005SLOWVT: 91 pct
TZAT-0012ATACH: 150 ms
TZAT-0012ATACH: 150 ms
TZAT-0012FASTVT: 200 ms
TZAT-0012SLOWVT: 200 ms
TZAT-0013FASTVT: 1
TZAT-0013SLOWVT: 2
TZAT-0013SLOWVT: 2
TZAT-0018ATACH: NEGATIVE
TZAT-0018ATACH: NEGATIVE
TZAT-0018FASTVT: NEGATIVE
TZAT-0019ATACH: 6 V
TZAT-0019FASTVT: 8 V
TZAT-0020ATACH: 1.5 ms
TZAT-0020FASTVT: 1.5 ms
TZON-0003ATACH: 350 ms
TZON-0003SLOWVT: 350 ms
TZON-0003VSLOWVT: 450 ms
TZON-0004SLOWVT: 16
TZON-0004VSLOWVT: 20
TZST-0001ATACH: 4
TZST-0001ATACH: 6
TZST-0001FASTVT: 2
TZST-0001FASTVT: 5
TZST-0001SLOWVT: 3
TZST-0001SLOWVT: 6
TZST-0002ATACH: NEGATIVE
TZST-0002ATACH: NEGATIVE
TZST-0003FASTVT: 35 J
TZST-0003FASTVT: 35 J
TZST-0003SLOWVT: 35 J
TZST-0003SLOWVT: 35 J
TZST-0003SLOWVT: 35 J
VENTRICULAR PACING ICD: 1.41 pct
VF: 0

## 2012-06-17 MED ORDER — ISOSORBIDE MONONITRATE ER 60 MG PO TB24: 60.0000 mg | ORAL_TABLET | Freq: Every day | ORAL | Status: DC

## 2012-06-17 MED ORDER — BISOPROLOL FUMARATE 10 MG PO TABS: 10.0000 mg | ORAL_TABLET | Freq: Every day | ORAL | Status: DC

## 2012-06-17 NOTE — Assessment & Plan Note (Addendum)
Device has reached ERI. He'll need to be replaced. He also has a 6949-lead which will need to be replaced also. Hopefully his vein is patent and will just add another defibrillator lead. We have reviewed the benefits and risks of generator replacement.  These include but are not limited to lead fracture and infection.  The patient understands, agrees and is willing to proceed.

## 2012-06-17 NOTE — Assessment & Plan Note (Signed)
No intercurrent VT 

## 2012-06-17 NOTE — Progress Notes (Signed)
Patient Care Team: Maura L Hamrick as PCP - General (Family Medicine)   HPI  Cody Faulkner is a 67 y.o. male Seen in followup for an ICD implanted with an ICD in 2007 for wide-complex tachycardia in the setting of nonischemic cardiomyopathy.    He is moderate exercise intolerance. He is short of breath at less than 100 yards. He has orthopnea and some peripheral edema.  He sees Dr. Tilley 2-3 times a year. He has been sent certified letters on our behalf in the past.  He says he has seen Dr. Tilley in the last few months. Somebody has spoken to his device in the last couple of months. It may have been done remotely. He is now in atrial fibrillation and has significant complaints of exercise intolerance. He also underwent a sleep study in May which was significantly abnormal. He has not had followup with pulmonary here today but that is now scheduled for next week.  His device has reached ERI. He has a 6949-lead which will need to be replaced. He is on Coumadin for his atrial fibrillation and we'll need to identify his INRs.   Past Medical History  Diagnosis Date  . Atrial fibrillation     Cardioversion 2007  . Hypertension   . OSA (obstructive sleep apnea)        . Depression   . Gouty arthritis   . CAD (coronary artery disease)     predominantly single vessel  . Ventricular tachycardia     s/p defibrillator-Medtronic EnTrust D154  . Dilated cardiomyopathy     s/p defibrillator Medtronic EnTrust D154  . Asthma   . 6949 Lead   . Implantable Defibrillator     Medtronic Entrust  . Chronic systolic heart failure   . Hypothyroidism   . Acute renal failure 06/06/2011  . Solitary kidney 06/05/2006  . Gout   . CHF (congestive heart failure)   . Pneumonia, community acquired     bilateral bibasilar   . Shortness of breath on exertion     Past Surgical History  Procedure Date  . Cardiac defibrillator placement 2007    Medtronic EnTrust D154  . Nephrectomy 1976    Right  .  Cardiac catheterization 2007    60% Stenosis mid-CFX  . Knee arthroscopy     left  . Cardioversion 06/18/2011    Procedure: CARDIOVERSION;  Surgeon: W Spencer Tilley Jr., MD;  Location: [facility] OR;  Service: Cardiovascular;  Laterality: N/A;    Current Outpatient Prescriptions  Medication Sig Dispense Refill  . albuterol (PROVENTIL HFA;VENTOLIN HFA) 108 (90 BASE) MCG/ACT inhaler Inhale 2 puffs into the lungs every 4 (four) hours as needed. For shortness of breath       . allopurinol (ZYLOPRIM) 300 MG tablet Take 300 mg by mouth daily.       . aspirin 81 MG chewable tablet Chew 81 mg by mouth daily.        . bisoprolol (ZEBETA) 5 MG tablet Take 5 mg by mouth daily.      . budesonide-formoterol (SYMBICORT) 160-4.5 MCG/ACT inhaler Inhale 2 puffs into the lungs 2 (two) times daily.      . fenofibrate micronized (LOFIBRA) 134 MG capsule Take 134 mg by mouth daily before breakfast.       . furosemide (LASIX) 40 MG tablet Take 40 mg by mouth daily.      . hydrALAZINE (APRESOLINE) 25 MG tablet Take 25 mg by mouth 3 (three) times daily.      .   loratadine (CLARITIN) 10 MG tablet Take 10 mg by mouth every morning.       . potassium chloride SA (K-DUR,KLOR-CON) 20 MEQ tablet Take 20 mEq by mouth every other day.      . SYNTHROID 100 MCG tablet Take 100 mcg by mouth daily.       . warfarin (COUMADIN) 5 MG tablet Take 5-7.5 mg by mouth every morning. Takes 1.5 tab (7.5mg) 4 days a week & take 1 tab (5mg) on all other days      . Fluticasone-Salmeterol (ADVAIR DISKUS) 250-50 MCG/DOSE AEPB Inhale 1 puff into the lungs every 12 (twelve) hours.        Allergies  Allergen Reactions  . Methimazole (Thiamazole) Itching and Rash    Severe rash    Review of Systems negative except from HPI and PMH  Physical Exam BP 160/100  Pulse 81  Ht 5' 5" (1.651 m)  Wt 269 lb 6.4 oz (122.199 kg)  BMI 44.83 kg/m2 Well developed and morbidly obese male in mild respiratory distress HENT normal E scleral and icterus  clear Neck Supple JVP 8-10; carotids brisk and full Clear to ausculation irregularly irregular   rate and rhythm, 2/6 Soft with active bowel sounds No clubbing cyanosis 2+ Edema Alert and oriented, grossly normal motor and sensory function Skin Warm and Dry    Assessment and  Plan  

## 2012-06-17 NOTE — Assessment & Plan Note (Signed)
Patient has reverted to atrial fibrillation. This has been sustained now for number of months. According to the record he previously took amiodarone; he is currently not taking it. His atrial fibrillation has been associated with some degree of rapid ventricular response prompting inappropriate device therapy. We will anticipate cardioversion at the time of device generator replacement at his Coumadin has been therapeutic. If not we'll undertake a TEE and try and coordinate that procedure. In the interim we'll increase his ZeBeta from 5--10 mg.

## 2012-06-17 NOTE — Patient Instructions (Signed)
Your physician has recommended you make the following change in your medication:  1) increase bisoprolol to 10 mg once daily 2) start imdur (isosorbide) 60 mg once daily  Your physician has recommended that you have a defibrillator generator change and lead replacement. Dr. Odessa Fleming nurse, Herbert Seta, will call you after speaking to [facility] regarding your coumadin level. ]

## 2012-06-17 NOTE — Assessment & Plan Note (Signed)
As above.

## 2012-06-17 NOTE — Assessment & Plan Note (Signed)
He has not followed up with Dr. Craige Cotta. This is scheduled for next week.

## 2012-06-20 ENCOUNTER — Encounter: Payer: Self-pay | Admitting: Pulmonary Disease

## 2012-06-20 ENCOUNTER — Ambulatory Visit (INDEPENDENT_AMBULATORY_CARE_PROVIDER_SITE_OTHER): Payer: Medicare Other | Admitting: Pulmonary Disease

## 2012-06-20 VITALS — BP 122/76 | HR 99 | Temp 98.4°F | Ht 65.0 in | Wt 275.6 lb

## 2012-06-20 DIAGNOSIS — G4731 Primary central sleep apnea: Secondary | ICD-10-CM

## 2012-06-20 DIAGNOSIS — G4730 Sleep apnea, unspecified: Secondary | ICD-10-CM

## 2012-06-20 DIAGNOSIS — J4530 Mild persistent asthma, uncomplicated: Secondary | ICD-10-CM

## 2012-06-20 DIAGNOSIS — J45909 Unspecified asthma, uncomplicated: Secondary | ICD-10-CM

## 2012-06-20 NOTE — Assessment & Plan Note (Signed)
He is stable on his inhaler regimen.  This can be monitored by his PCP.  He is concerned about expense from so many doctor visits, and wants to limit these.

## 2012-06-20 NOTE — Progress Notes (Signed)
Chief Complaint  Patient presents with  . Follow-up    breathing has been okay. c/o slight dry cough, chest congestion. no wheezing, chest tx    History of Present Illness: Cody Faulkner is a 67 y.o. male with OSA, and asthma.  His breathing is doing okay.  He is using symbicort one puff twice per day.  He is not interested in trying to set up CPAP.  He can't afford it, and is not interesting in looking into options for this.  TESTS: Room air ONO 09/03/11>>Test time 6 hrs 35 min.  Mean SpO2 89.9%, low SpO2 66%.  Spent 2 hrs 3 min with SpO2 < 88%. PFT 09/16/11>>FEV1 2.05(80%), FEV1% 79, TLC 8.05(150%), DLCO 65%, no BD Split night study 10/06/11>>AHI 87.4, SpO2 low 72%.  Sub-optimal titration.  Centrals with higher CPAP/BPAP settings.  CPAP 11 cm H2O seemed best for OSA.  Past Medical History  Diagnosis Date  . Atrial fibrillation     Cardioversion 2007  . Hypertension   . OSA (obstructive sleep apnea)        . Depression   . Gouty arthritis   . CAD (coronary artery disease)     predominantly single vessel  . Ventricular tachycardia     s/p defibrillator-Medtronic EnTrust D154  . Dilated cardiomyopathy     s/p defibrillator Medtronic EnTrust [phone]  . Asthma   . 1191 Lead   . Implantable Defibrillator     Medtronic Entrust  . Chronic systolic heart failure   . Hypothyroidism   . Acute renal failure 06/06/2011  . Solitary kidney 06/05/2006  . Gout   . CHF (congestive heart failure)   . Pneumonia, community acquired     bilateral bibasilar   . Shortness of breath on exertion     Past Surgical History  Procedure Date  . Cardiac defibrillator placement 2007    Medtronic EnTrust [phone]  . Nephrectomy 1976    Right  . Cardiac catheterization 2007    60% Stenosis mid-CFX  . Knee arthroscopy     left  . Cardioversion 06/18/2011    Procedure: CARDIOVERSION;  Surgeon: Darden Palmer., MD;  Location: [facility] OR;  Service: Cardiovascular;  Laterality: N/A;    Outpatient  Encounter Prescriptions as of 06/20/2012  Medication Sig Dispense Refill  . albuterol (PROVENTIL HFA;VENTOLIN HFA) 108 (90 BASE) MCG/ACT inhaler Inhale 2 puffs into the lungs every 4 (four) hours as needed. For shortness of breath       . allopurinol (ZYLOPRIM) 300 MG tablet Take 300 mg by mouth daily.       Marland Kitchen aspirin 81 MG chewable tablet Chew 81 mg by mouth daily.        . bisoprolol (ZEBETA) 10 MG tablet Take 1 tablet (10 mg total) by mouth daily.  30 tablet  6  . budesonide-formoterol (SYMBICORT) 160-4.5 MCG/ACT inhaler Inhale 2 puffs into the lungs 2 (two) times daily.      . fenofibrate micronized (LOFIBRA) 134 MG capsule Take 134 mg by mouth daily before breakfast.       . furosemide (LASIX) 40 MG tablet Take 40 mg by mouth daily.      . isosorbide mononitrate (IMDUR) 60 MG 24 hr tablet Take 1 tablet (60 mg total) by mouth daily.  30 tablet  6  . loratadine (CLARITIN) 10 MG tablet Take 10 mg by mouth every morning.       . potassium chloride SA (K-DUR,KLOR-CON) 20 MEQ tablet Take 20 mEq by mouth every  other day.      Marland Kitchen SYNTHROID 100 MCG tablet Take 100 mcg by mouth daily.       Marland Kitchen warfarin (COUMADIN) 5 MG tablet Take 5-7.5 mg by mouth every morning. Takes 1.5 tab (7.5mg ) 4 days a week & take 1 tab (5mg ) on all other days      . hydrALAZINE (APRESOLINE) 25 MG tablet Take 25 mg by mouth 3 (three) times daily.      . [DISCONTINUED] Fluticasone-Salmeterol (ADVAIR DISKUS) 250-50 MCG/DOSE AEPB Inhale 1 puff into the lungs every 12 (twelve) hours.        Allergies  Allergen Reactions  . Methimazole (Thiamazole) Itching and Rash    Severe rash    Physical Exam:  Filed Vitals:   06/20/12 1349 06/20/12 1352  BP:  122/76  Pulse:  99  Temp: 98.4 F (36.9 C)   TempSrc: Oral   Height: 5\' 5"  (1.651 m)   Weight: 275 lb 9.6 oz (125.011 kg)   SpO2:  95%     Current Encounter SPO2  06/20/12 1352 95%  12/17/11 2002 95%  12/17/11 1758 94%  12/09/11 1335 95%     Body mass index is 45.86  kg/(m^2).   Wt Readings from Last 2 Encounters:  06/20/12 275 lb 9.6 oz (125.011 kg)  06/17/12 269 lb 6.4 oz (122.199 kg)     General - No distress ENT - No sinus tenderness, no oral exudate, no LAN Cardiac - s1s2 regular, no murmur Chest - No wheeze/rales/dullness Back - No focal tenderness Abd - Soft, non-tender Ext - No edema Neuro - Normal strength Skin - No rashes Psych - normal mood, and behavior   Assessment/Plan:  Coralyn Helling, MD [facility] Pager:  [phone] 06/20/2012, 2:03 PM

## 2012-06-20 NOTE — Assessment & Plan Note (Signed)
He can not afford CPAP set up.  He was told this would be $500 per month.  I informed him that this was incorrect, and that we could look into options to assist with CPAP set up.  He is not interested in doing this.  I explained to him how this could impact his health, but he again stated he does not want to pursue CPAP set up.  I have advised him to contact our office if decision to not use CPAP changes.

## 2012-06-20 NOTE — Patient Instructions (Addendum)
Follow up as needed

## 2012-07-04 ENCOUNTER — Telehealth: Payer: Self-pay | Admitting: Internal Medicine

## 2012-07-04 DIAGNOSIS — I4891 Unspecified atrial fibrillation: Secondary | ICD-10-CM

## 2012-07-04 DIAGNOSIS — I5022 Chronic systolic (congestive) heart failure: Secondary | ICD-10-CM

## 2012-07-04 NOTE — Telephone Encounter (Signed)
Patient called no answer.Left message on personal voice mail Dr.Klein's nurse out of office today will forward message to her and she will call you back.

## 2012-07-04 NOTE — Telephone Encounter (Signed)
Pt is concerned because he was told his device is about to reach ERI and since we have not contacted him he is concerned and would like to hear from [place] as to what the next step would be and if he needs to do anything or what he needs to look out for

## 2012-07-05 NOTE — Telephone Encounter (Signed)
I spoke with Cody Faulkner and explained he will need a TEE prior to ICD lead revision and generator change. I have offered dates of 2/3 and 2/5 for him. He will check with the person who will bring him to see which day will work. I will call him back on Thursday when I'm back in the office to verify which date.

## 2012-07-07 ENCOUNTER — Encounter: Payer: Self-pay | Admitting: *Deleted

## 2012-07-07 NOTE — Telephone Encounter (Signed)
I left a message for the patient to call to schedule his procedure.

## 2012-07-07 NOTE — Telephone Encounter (Signed)
I have spoken with the patient. He is scheduled for his TEE on 2/5 at 10:00 am with Dr. Jens Som & at 11:00 am with Dr. Graciela Husbands for an ICD generator change and lead revision. I have spoken with the patient and his family member regarding his instructions. He will come on 2/3 for labs to be done. I have advised a written copy of the instructions will be at the front desk for them to pick up on Monday.

## 2012-07-09 HISTORY — PX: ICD GENERATOR CHANGE: SHX5854

## 2012-07-11 ENCOUNTER — Other Ambulatory Visit (INDEPENDENT_AMBULATORY_CARE_PROVIDER_SITE_OTHER): Payer: Medicare Other

## 2012-07-11 DIAGNOSIS — I5022 Chronic systolic (congestive) heart failure: Secondary | ICD-10-CM

## 2012-07-11 DIAGNOSIS — I4891 Unspecified atrial fibrillation: Secondary | ICD-10-CM

## 2012-07-11 LAB — BASIC METABOLIC PANEL
CO2: 30 mEq/L (ref 19–32)
Calcium: 9.4 mg/dL (ref 8.4–10.5)
Glucose, Bld: 93 mg/dL (ref 70–99)
Sodium: 142 mEq/L (ref 135–145)

## 2012-07-11 LAB — CBC WITH DIFFERENTIAL/PLATELET
Basophils Absolute: 0 10*3/uL (ref 0.0–0.1)
Eosinophils Absolute: 0.1 10*3/uL (ref 0.0–0.7)
Hemoglobin: 13.5 g/dL (ref 13.0–17.0)
Lymphocytes Relative: 30.3 % (ref 12.0–46.0)
Lymphs Abs: 2.4 10*3/uL (ref 0.7–4.0)
MCHC: 33.2 g/dL (ref 30.0–36.0)
Neutro Abs: 4.6 10*3/uL (ref 1.4–7.7)
RDW: 15.2 % — ABNORMAL HIGH (ref 11.5–14.6)

## 2012-07-12 MED ORDER — DEXTROSE 5 % IV SOLN
3.0000 g | INTRAVENOUS | Status: DC
  Filled 2012-07-12: qty 3000

## 2012-07-12 MED ORDER — SODIUM CHLORIDE 0.9 % IR SOLN
80.0000 mg | Status: DC
  Filled 2012-07-12: qty 2

## 2012-07-13 ENCOUNTER — Encounter (HOSPITAL_COMMUNITY): Payer: Self-pay | Admitting: *Deleted

## 2012-07-13 ENCOUNTER — Encounter (HOSPITAL_COMMUNITY)
Admission: RE | Disposition: A | Discharge status: Home / Self Care | Payer: Self-pay | Source: Ambulatory Visit | Attending: Cardiology

## 2012-07-13 ENCOUNTER — Ambulatory Visit (HOSPITAL_COMMUNITY)
Admission: RE | Admit: 2012-07-13 | Discharge: 2012-07-14 | Disposition: A | Discharge status: Home / Self Care | Payer: Medicare Other | Source: Ambulatory Visit | Attending: Cardiology | Admitting: Cardiology

## 2012-07-13 DIAGNOSIS — I4891 Unspecified atrial fibrillation: Secondary | ICD-10-CM

## 2012-07-13 DIAGNOSIS — I10 Essential (primary) hypertension: Secondary | ICD-10-CM | POA: Insufficient documentation

## 2012-07-13 DIAGNOSIS — I4729 Other ventricular tachycardia: Secondary | ICD-10-CM | POA: Insufficient documentation

## 2012-07-13 DIAGNOSIS — I509 Heart failure, unspecified: Secondary | ICD-10-CM | POA: Insufficient documentation

## 2012-07-13 DIAGNOSIS — I5042 Chronic combined systolic (congestive) and diastolic (congestive) heart failure: Secondary | ICD-10-CM | POA: Diagnosis present

## 2012-07-13 DIAGNOSIS — I480 Paroxysmal atrial fibrillation: Secondary | ICD-10-CM | POA: Diagnosis present

## 2012-07-13 DIAGNOSIS — T82198A Other mechanical complication of other cardiac electronic device, initial encounter: Secondary | ICD-10-CM | POA: Diagnosis present

## 2012-07-13 DIAGNOSIS — I472 Ventricular tachycardia, unspecified: Secondary | ICD-10-CM | POA: Insufficient documentation

## 2012-07-13 DIAGNOSIS — Z4502 Encounter for adjustment and management of automatic implantable cardiac defibrillator: Secondary | ICD-10-CM | POA: Insufficient documentation

## 2012-07-13 DIAGNOSIS — F3289 Other specified depressive episodes: Secondary | ICD-10-CM | POA: Insufficient documentation

## 2012-07-13 DIAGNOSIS — Z7901 Long term (current) use of anticoagulants: Secondary | ICD-10-CM

## 2012-07-13 DIAGNOSIS — I420 Dilated cardiomyopathy: Secondary | ICD-10-CM

## 2012-07-13 DIAGNOSIS — I5020 Unspecified systolic (congestive) heart failure: Secondary | ICD-10-CM | POA: Insufficient documentation

## 2012-07-13 DIAGNOSIS — Z95810 Presence of automatic (implantable) cardiac defibrillator: Secondary | ICD-10-CM

## 2012-07-13 DIAGNOSIS — F329 Major depressive disorder, single episode, unspecified: Secondary | ICD-10-CM | POA: Insufficient documentation

## 2012-07-13 DIAGNOSIS — G4731 Primary central sleep apnea: Secondary | ICD-10-CM

## 2012-07-13 DIAGNOSIS — E039 Hypothyroidism, unspecified: Secondary | ICD-10-CM | POA: Insufficient documentation

## 2012-07-13 DIAGNOSIS — G4733 Obstructive sleep apnea (adult) (pediatric): Secondary | ICD-10-CM | POA: Insufficient documentation

## 2012-07-13 DIAGNOSIS — I2510 Atherosclerotic heart disease of native coronary artery without angina pectoris: Secondary | ICD-10-CM | POA: Insufficient documentation

## 2012-07-13 DIAGNOSIS — I5022 Chronic systolic (congestive) heart failure: Secondary | ICD-10-CM

## 2012-07-13 DIAGNOSIS — I428 Other cardiomyopathies: Secondary | ICD-10-CM | POA: Insufficient documentation

## 2012-07-13 HISTORY — PX: TEE WITHOUT CARDIOVERSION: SHX5443

## 2012-07-13 HISTORY — PX: IMPLANTABLE CARDIOVERTER DEFIBRILLATOR REVISION: SHX5470

## 2012-07-13 LAB — PROTIME-INR: INR: 3.01 — ABNORMAL HIGH (ref 0.00–1.49)

## 2012-07-13 SURGERY — ECHOCARDIOGRAM, TRANSESOPHAGEAL
Anesthesia: Moderate Sedation

## 2012-07-13 SURGERY — IMPLANTABLE CARDIOVERTER DEFIBRILLATOR REVISION
Anesthesia: LOCAL

## 2012-07-13 MED ORDER — ALLOPURINOL 100 MG PO TABS
100.0000 mg | ORAL_TABLET | Freq: Every day | ORAL | Status: DC
  Administered 2012-07-14: 100 mg via ORAL
  Filled 2012-07-13: qty 1

## 2012-07-13 MED ORDER — ALBUTEROL SULFATE HFA 108 (90 BASE) MCG/ACT IN AERS: 2.0000 | INHALATION_SPRAY | RESPIRATORY_TRACT | Status: DC | PRN

## 2012-07-13 MED ORDER — DEXTROSE 5 % IV SOLN
3.0000 g | INTRAVENOUS | Status: DC
  Filled 2012-07-13: qty 3000

## 2012-07-13 MED ORDER — WARFARIN SODIUM 7.5 MG PO TABS: 7.5000 mg | ORAL_TABLET | ORAL | Status: DC

## 2012-07-13 MED ORDER — WARFARIN SODIUM 5 MG PO TABS: 5.0000 mg | ORAL_TABLET | Freq: Every day | ORAL | Status: DC

## 2012-07-13 MED ORDER — ASPIRIN 81 MG PO CHEW
81.0000 mg | CHEWABLE_TABLET | Freq: Every day | ORAL | Status: DC
  Administered 2012-07-14: 81 mg via ORAL
  Filled 2012-07-13: qty 1

## 2012-07-13 MED ORDER — MIDAZOLAM HCL 10 MG/2ML IJ SOLN
INTRAMUSCULAR | Status: DC | PRN
  Administered 2012-07-13: 2 mg via INTRAVENOUS

## 2012-07-13 MED ORDER — FENTANYL CITRATE 0.05 MG/ML IJ SOLN
INTRAMUSCULAR | Status: AC
  Filled 2012-07-13: qty 2

## 2012-07-13 MED ORDER — ONDANSETRON HCL 4 MG/2ML IJ SOLN: 4.0000 mg | Freq: Four times a day (QID) | INTRAMUSCULAR | Status: DC | PRN

## 2012-07-13 MED ORDER — BISOPROLOL FUMARATE 10 MG PO TABS
10.0000 mg | ORAL_TABLET | Freq: Every day | ORAL | Status: DC
  Administered 2012-07-14: 10 mg via ORAL
  Filled 2012-07-13: qty 1

## 2012-07-13 MED ORDER — SODIUM CHLORIDE 0.9 % IR SOLN
Freq: Once | Status: DC
  Filled 2012-07-13: qty 2

## 2012-07-13 MED ORDER — FENTANYL CITRATE 0.05 MG/ML IJ SOLN
INTRAMUSCULAR | Status: DC | PRN
  Administered 2012-07-13: 25 ug via INTRAVENOUS

## 2012-07-13 MED ORDER — FUROSEMIDE 40 MG PO TABS
40.0000 mg | ORAL_TABLET | Freq: Every day | ORAL | Status: DC
  Administered 2012-07-13: 40 mg via ORAL
  Filled 2012-07-13 (×3): qty 1

## 2012-07-13 MED ORDER — WARFARIN - PHYSICIAN DOSING INPATIENT: Freq: Every day | Status: DC

## 2012-07-13 MED ORDER — HEPARIN (PORCINE) IN NACL 2-0.9 UNIT/ML-% IJ SOLN
INTRAMUSCULAR | Status: AC
  Filled 2012-07-13: qty 500

## 2012-07-13 MED ORDER — SODIUM CHLORIDE 0.9 % IV SOLN: 250.0000 mL | INTRAVENOUS | Status: DC

## 2012-07-13 MED ORDER — SODIUM CHLORIDE 0.9 % IJ SOLN: 3.0000 mL | INTRAMUSCULAR | Status: DC | PRN

## 2012-07-13 MED ORDER — BUTAMBEN-TETRACAINE-BENZOCAINE 2-2-14 % EX AERO
INHALATION_SPRAY | CUTANEOUS | Status: DC | PRN
  Administered 2012-07-13: 2 via TOPICAL

## 2012-07-13 MED ORDER — FLUTICASONE-SALMETEROL 100-50 MCG/DOSE IN AEPB: 1.0000 | INHALATION_SPRAY | Freq: Two times a day (BID) | RESPIRATORY_TRACT | Status: DC

## 2012-07-13 MED ORDER — ISOSORBIDE MONONITRATE ER 60 MG PO TB24
60.0000 mg | ORAL_TABLET | Freq: Every day | ORAL | Status: DC
  Administered 2012-07-14: 60 mg via ORAL
  Filled 2012-07-13: qty 1

## 2012-07-13 MED ORDER — HYDRALAZINE HCL 25 MG PO TABS
25.0000 mg | ORAL_TABLET | Freq: Three times a day (TID) | ORAL | Status: DC
  Administered 2012-07-13 – 2012-07-14 (×4): 25 mg via ORAL
  Filled 2012-07-13 (×5): qty 1

## 2012-07-13 MED ORDER — LORATADINE 10 MG PO TABS
10.0000 mg | ORAL_TABLET | Freq: Every morning | ORAL | Status: DC
Start: 2012-07-14 — End: 2012-07-15
  Administered 2012-07-14: 10 mg via ORAL
  Filled 2012-07-13: qty 1

## 2012-07-13 MED ORDER — POTASSIUM CHLORIDE CRYS ER 20 MEQ PO TBCR
20.0000 meq | EXTENDED_RELEASE_TABLET | Freq: Every day | ORAL | Status: DC
  Administered 2012-07-14: 20 meq via ORAL
  Filled 2012-07-13 (×2): qty 1

## 2012-07-13 MED ORDER — SODIUM CHLORIDE 0.9 % IV SOLN
INTRAVENOUS | Status: DC
  Administered 2012-07-13: 500 mL via INTRAVENOUS

## 2012-07-13 MED ORDER — SODIUM CHLORIDE 0.9 % IV SOLN
INTRAVENOUS | Status: AC
  Administered 2012-07-13: 10 mL/h via INTRAVENOUS

## 2012-07-13 MED ORDER — CHLORHEXIDINE GLUCONATE 4 % EX LIQD: 60.0000 mL | Freq: Once | CUTANEOUS | Status: DC

## 2012-07-13 MED ORDER — SODIUM CHLORIDE 0.9 % IJ SOLN: 3.0000 mL | Freq: Two times a day (BID) | INTRAMUSCULAR | Status: DC

## 2012-07-13 MED ORDER — LIDOCAINE HCL (PF) 1 % IJ SOLN
INTRAMUSCULAR | Status: AC
  Filled 2012-07-13: qty 60

## 2012-07-13 MED ORDER — CEFAZOLIN SODIUM 1-5 GM-% IV SOLN
1.0000 g | Freq: Four times a day (QID) | INTRAVENOUS | Status: AC
  Administered 2012-07-13 – 2012-07-14 (×3): 1 g via INTRAVENOUS
  Filled 2012-07-13 (×3): qty 50

## 2012-07-13 MED ORDER — LEVOTHYROXINE SODIUM 100 MCG PO TABS
100.0000 ug | ORAL_TABLET | Freq: Every day | ORAL | Status: DC
  Administered 2012-07-14: 100 ug via ORAL
  Filled 2012-07-13: qty 1

## 2012-07-13 MED ORDER — ACETAMINOPHEN 325 MG PO TABS
325.0000 mg | ORAL_TABLET | ORAL | Status: DC | PRN
  Administered 2012-07-13 – 2012-07-14 (×2): 650 mg via ORAL
  Filled 2012-07-13 (×2): qty 2

## 2012-07-13 MED ORDER — SODIUM CHLORIDE 0.45 % IV SOLN: INTRAVENOUS | Status: DC

## 2012-07-13 MED ORDER — MIDAZOLAM HCL 5 MG/ML IJ SOLN
INTRAMUSCULAR | Status: AC
  Filled 2012-07-13: qty 2

## 2012-07-13 MED ORDER — WARFARIN SODIUM 5 MG PO TABS
5.0000 mg | ORAL_TABLET | ORAL | Status: DC
  Filled 2012-07-13: qty 1

## 2012-07-13 MED ORDER — BUDESONIDE-FORMOTEROL FUMARATE 160-4.5 MCG/ACT IN AERO
2.0000 | INHALATION_SPRAY | Freq: Two times a day (BID) | RESPIRATORY_TRACT | Status: DC
  Administered 2012-07-13 – 2012-07-14 (×2): 2 via RESPIRATORY_TRACT
  Filled 2012-07-13: qty 6

## 2012-07-13 MED ORDER — MIDAZOLAM HCL 5 MG/5ML IJ SOLN
INTRAMUSCULAR | Status: AC
  Filled 2012-07-13: qty 5

## 2012-07-13 NOTE — Op Note (Signed)
NAMESRIHARI, Cody Faulkner NO.:  [id number]  MEDICAL RECORD NO.:  [id number]  LOCATION:  2910                         FACILITY:  [facility]  PHYSICIAN:  Duke Salvia, MD, FACCDATE OF BIRTH:  03-22-1946  DATE OF PROCEDURE:  07/13/2012 DATE OF DISCHARGE:                              OPERATIVE REPORT   PREOPERATIVE DIAGNOSES:  Atrial fibrillation, history of wide complex tachycardia, now resolved, nonischemic cardiomyopathy, high risk defibrillator lead and generator at end of life.  POSTOPERATIVE DIAGNOSES:  Atrial fibrillation, history of wide complex tachycardia, now resolved, nonischemic cardiomyopathy, high risk defibrillator lead and generator at end of life; patent left subclavian vein.  PROCEDURE:  Contrast venogram, explantation of device, insertion of new lead, insertion of new device, high-voltage testing; DC cardioversion.  DESCRIPTION OF PROCEDURE:  Following obtaining informed consent, the patient was brought to the Electrophysiology Laboratory and placed on the fluoroscopic table in supine position.  Prior to prep and drape, contrast venogram demonstrated patency of the extrathoracic left subclavian vein, and the patient was then prepped and draped in a sterile fashion.  CPAP was used given his obstructive sleep apnea. Lidocaine was infiltrated into the same of his prior incision, which was carried down at the layer of the prepectoral fascia using electrocautery and sharp dissection.  Venous access was then obtained with modest difficulty with puncture of the artery on one occasion with pressure held x2 minutes.  A single venipuncture was then accomplished.  A guidewire was placed and retained.  A 9-French sheath was placed, which was then passed a Medtronic 6935 single coil defibrillator lead, serial number [id number] and through the sheath was deployed to the right ventricular apex where the bipolar R-wave was about 8.5 millivolts, the impedance was  925, and threshold was 0.7 at 0.5.  Current of threshold was 0.8 mA.  There was no diaphragmatic pacing at 10 volts.  The current of injury was brisk.  This lead was secured to the prepectoral fascia. At this juncture, the previously implanted device was sought after and the pocket opened and the device explanted.  The previously implanted defibrillator lead was capped.  The fibrillation wave was 2.6 on the previously implanted atrial lead with impedance of 420.  The leads were then attached to a Medtronic Evera ICD, model DDBB1D1 defibrillator, serial number [id number].  Through the device, the bipolar fibrillation wave was 1.6 with a pacing impedance of 399.  The R-wave was 11.5 with a pacing impedance of 705, threshold was stable.  The high-voltage impedance was 68.  The pocket was copiously irrigated with antibiotic- containing saline solution.  Hemostasis was assured.  The leads and the pulse generator were placed in the pocket, secured to the prepectoral fascia.  Surgicel was used along the cephalad aspect of the pocket and the wound was then closed in two layers in normal fashion.  The wound was washed, dried, and benzoin and Steri-Strip dressing was applied. Needle counts, sponge counts, and instrument counts were correct at the end of procedure.  We then undertook cardioversion.  A 35-joule synchronized shock was delivered through his ICD.  It failed to terminate atrial fibrillation. The impedance was 86 ohms.  The  patient was then cardioverted externally.  A 200- joule shock was delivered with biphasic anterior-posterior configuration.  This also failed to terminate atrial fibrillation.  The patient is on amiodarone.  The patient will be admitted to Step-Down and will undergo ibutilide facilitated cardioversion in the morning.     Duke Salvia, MD, [facility]     SCK/MEDQ  D:  07/13/2012  T:  07/13/2012  Job:  [id number]

## 2012-07-13 NOTE — H&P (Signed)
Cody Faulkner  Description:  67 year old male  06/17/2012 11:30 AM Office Visit Provider:  Duke Salvia, MD  MRN: [id number] Department:  [address]            Referring Provider     Ailene Ravel, MD         Diagnoses  Reason for Visit    Atrial fibrillation - Primary  Follow-up   427.31  device check, occ sob   Chronic systolic heart failure      428.22    Dilated cardiomyopathy     425.4    Ventricular tachycardia     427.1    Automatic implantable cardiac defibrillator in situ     V45.02    Complex sleep apnea syndrome     780.57    CAD (coronary artery disease)     414.00    Mechanical complication due to automatic implantable cardiac defibrillator     996.04              Progress Notes     Sherryl Manges, MD 06/18/2012 11:31 AM Signed  Patient Care Team:  Ailene Ravel as PCP - General (Family Medicine)  HPI  Cody Faulkner is a 67 y.o. male  Seen in followup for an ICD implanted with an ICD in 2007 for wide-complex tachycardia in the setting of nonischemic cardiomyopathy.  He is moderate exercise intolerance. He is short of breath at less than 100 yards. He has orthopnea and some peripheral edema.  He sees Dr. Donnie Aho 2-3 times a year. He has been sent certified letters on our behalf in the past.  He says he has seen Dr. Donnie Aho in the last few months. Somebody has spoken to his device in the last couple of months. It may have been done remotely. He is now in atrial fibrillation and has significant complaints of exercise intolerance. He also underwent a sleep study in May which was significantly abnormal. He has not had followup with pulmonary here today but that is now scheduled for next week.  His device has reached ERI. He has a 6949-lead which will need to be replaced. He is on Coumadin for his atrial fibrillation and we'll need to identify his INRs.     Past Medical History     Diagnosis  Date     .  Atrial fibrillation      Cardioversion 2007     .  Hypertension      .  OSA (obstructive sleep apnea)            .  Depression      .  Gouty arthritis      .  CAD (coronary artery disease)        predominantly single vessel     .  Ventricular tachycardia        s/p defibrillator-Medtronic EnTrust D154     .  Dilated cardiomyopathy        s/p defibrillator Medtronic EnTrust [phone]     .  Asthma      .  2956 Lead      .  Implantable Defibrillator        Medtronic Entrust     .  Chronic systolic heart failure      .  Hypothyroidism      .  Acute renal failure  06/06/2011     .  Solitary kidney  06/05/2006     .  Gout      .  CHF (congestive heart failure)      .  Pneumonia, community acquired        bilateral bibasilar     .  Shortness of breath on exertion          Past Surgical History     Procedure  Date     .  Cardiac defibrillator placement  2007       Medtronic EnTrust [phone]     .  Nephrectomy  1976       Right     .  Cardiac catheterization  2007       60% Stenosis mid-CFX     .  Knee arthroscopy        left     .  Cardioversion  06/18/2011       Procedure: CARDIOVERSION; Surgeon: Darden Palmer., MD; Location: [facility] OR; Service: Cardiovascular; Laterality: N/A;         Current Outpatient Prescriptions     Medication  Sig  Dispense  Refill     .  albuterol (PROVENTIL HFA;VENTOLIN HFA) 108 (90 BASE) MCG/ACT inhaler  Inhale 2 puffs into the lungs every 4 (four) hours as needed. For shortness of breath       .  allopurinol (ZYLOPRIM) 300 MG tablet  Take 300 mg by mouth daily.       Marland Kitchen  aspirin 81 MG chewable tablet  Chew 81 mg by mouth daily.       .  bisoprolol (ZEBETA) 5 MG tablet  Take 5 mg by mouth daily.       .  budesonide-formoterol (SYMBICORT) 160-4.5 MCG/ACT inhaler  Inhale 2 puffs into the lungs 2 (two) times daily.       .  fenofibrate micronized (LOFIBRA) 134 MG capsule  Take 134 mg by mouth daily before breakfast.       .  furosemide (LASIX) 40 MG tablet  Take 40 mg by mouth daily.        .  hydrALAZINE (APRESOLINE) 25 MG tablet  Take 25 mg by mouth 3 (three) times daily.       Marland Kitchen  loratadine (CLARITIN) 10 MG tablet  Take 10 mg by mouth every morning.       .  potassium chloride SA (K-DUR,KLOR-CON) 20 MEQ tablet  Take 20 mEq by mouth every other day.       Marland Kitchen  SYNTHROID 100 MCG tablet  Take 100 mcg by mouth daily.       Marland Kitchen  warfarin (COUMADIN) 5 MG tablet  Take 5-7.5 mg by mouth every morning. Takes 1.5 tab (7.5mg ) 4 days a week & take 1 tab (5mg ) on all other days       .  Fluticasone-Salmeterol (ADVAIR DISKUS) 250-50 MCG/DOSE AEPB  Inhale 1 puff into the lungs every 12 (twelve) hours.           Allergies     Allergen  Reactions     .  Methimazole (Thiamazole)  Itching and Rash       Severe rash      Review of Systems negative except from HPI and PMH  Physical Exam  BP 160/100  Pulse 81  Ht 5\' 5"  (1.651 m)  Wt 269 lb 6.4 oz (122.199 kg)  BMI 44.83 kg/m2  Well developed and morbidly obese male in mild respiratory distress  HENT normal  E scleral and icterus clear  Neck Supple  JVP 8-10; carotids  brisk and full  Clear to ausculation  irregularly irregular  rate and rhythm, 2/6 Soft with active bowel sounds  No clubbing cyanosis 2+ Edema  Alert and oriented, grossly normal motor and sensory function  Skin Warm and Dry  Assessment and Plan       Atrial fibrillation - Sherryl Manges, MD 06/17/2012 12:38 PM Signed  Patient has reverted to atrial fibrillation. This has been sustained now for number of months. According to the record he previously took amiodarone; he is currently not taking it. His atrial fibrillation has been associated with some degree of rapid ventricular response prompting inappropriate device therapy. We will anticipate cardioversion at the time of device generator replacement at his Coumadin has been therapeutic. If not we'll undertake a TEE and try and coordinate that procedure. In the interim we'll increase his ZeBeta from 5--10 mg. Ventricular  tachycardia - Sherryl Manges, MD 06/17/2012 12:38 PM Signed  No intercurrent VT Medtronic ICD - Sherryl Manges, MD 06/17/2012 12:39 PM Addendum  Device has reached ERI. He'll need to be replaced. He also has a 6949-lead which will need to be replaced also. Hopefully his vein is patent and will just add another defibrillator lead.  We have reviewed the benefits and risks of generator replacement. These include but are not limited to lead fracture and infection. The patient understands, agrees and is willing to proceed.   Previous Version  Complex sleep apnea syndrome - Sherryl Manges, MD 06/17/2012 12:40 PM Signed  He has not followed up with Dr. Craige Cotta. This is scheduled for next week. CAD (coronary artery disease)nonobstructive - Sherryl Manges, MD 06/17/2012 12:40 PM Signed  As above   1610 Lead - Sherryl Manges, MD 06/17/2012 12:40 PM Signed  As above      For TEE today; no change. [name]

## 2012-07-13 NOTE — CV Procedure (Signed)
Cody Faulkner [id number]  [phone]  Preop Dx atrial fib; high risk lead, prev ICD at [facility]  Postop Dx same/   Procedure: venogram, new lead, generator removal and replacement, dc cardioversion and high voltage testing  Cx: None   Dictation number [id number]  Sherryl Manges, MD 07/13/2012 12:45 PM

## 2012-07-13 NOTE — Progress Notes (Signed)
  Echocardiogram Echocardiogram Transesophageal has been performed.  Jorje Guild 07/13/2012, 10:43 AM

## 2012-07-13 NOTE — H&P (View-Only) (Signed)
Patient Care Team: Ailene Ravel as PCP - General (Family Medicine)   HPI  Cody Faulkner is a 67 y.o. male Seen in followup for an ICD implanted with an ICD in 2007 for wide-complex tachycardia in the setting of nonischemic cardiomyopathy.    He is moderate exercise intolerance. He is short of breath at less than 100 yards. He has orthopnea and some peripheral edema.  He sees Dr. Donnie Aho 2-3 times a year. He has been sent certified letters on our behalf in the past.  He says he has seen Dr. Donnie Aho in the last few months. Somebody has spoken to his device in the last couple of months. It may have been done remotely. He is now in atrial fibrillation and has significant complaints of exercise intolerance. He also underwent a sleep study in May which was significantly abnormal. He has not had followup with pulmonary here today but that is now scheduled for next week.  His device has reached ERI. He has a 6949-lead which will need to be replaced. He is on Coumadin for his atrial fibrillation and we'll need to identify his INRs.   Past Medical History  Diagnosis Date  . Atrial fibrillation     Cardioversion 2007  . Hypertension   . OSA (obstructive sleep apnea)        . Depression   . Gouty arthritis   . CAD (coronary artery disease)     predominantly single vessel  . Ventricular tachycardia     s/p defibrillator-Medtronic EnTrust [id number]  . Dilated cardiomyopathy     s/p defibrillator Medtronic EnTrust [phone]  . Asthma   . 8469 Lead   . Implantable Defibrillator     Medtronic Entrust  . Chronic systolic heart failure   . Hypothyroidism   . Acute renal failure 06/06/2011  . Solitary kidney 06/05/2006  . Gout   . CHF (congestive heart failure)   . Pneumonia, community acquired     bilateral bibasilar   . Shortness of breath on exertion     Past Surgical History  Procedure Date  . Cardiac defibrillator placement 2007    Medtronic EnTrust [phone]  . Nephrectomy 1976    Right  .  Cardiac catheterization 2007    60% Stenosis mid-CFX  . Knee arthroscopy     left  . Cardioversion 06/18/2011    Procedure: CARDIOVERSION;  Surgeon: Darden Palmer., MD;  Location: [facility] OR;  Service: Cardiovascular;  Laterality: N/A;    Current Outpatient Prescriptions  Medication Sig Dispense Refill  . albuterol (PROVENTIL HFA;VENTOLIN HFA) 108 (90 BASE) MCG/ACT inhaler Inhale 2 puffs into the lungs every 4 (four) hours as needed. For shortness of breath       . allopurinol (ZYLOPRIM) 300 MG tablet Take 300 mg by mouth daily.       Marland Kitchen aspirin 81 MG chewable tablet Chew 81 mg by mouth daily.        . bisoprolol (ZEBETA) 5 MG tablet Take 5 mg by mouth daily.      . budesonide-formoterol (SYMBICORT) 160-4.5 MCG/ACT inhaler Inhale 2 puffs into the lungs 2 (two) times daily.      . fenofibrate micronized (LOFIBRA) 134 MG capsule Take 134 mg by mouth daily before breakfast.       . furosemide (LASIX) 40 MG tablet Take 40 mg by mouth daily.      . hydrALAZINE (APRESOLINE) 25 MG tablet Take 25 mg by mouth 3 (three) times daily.      Marland Kitchen  loratadine (CLARITIN) 10 MG tablet Take 10 mg by mouth every morning.       . potassium chloride SA (K-DUR,KLOR-CON) 20 MEQ tablet Take 20 mEq by mouth every other day.      Marland Kitchen SYNTHROID 100 MCG tablet Take 100 mcg by mouth daily.       Marland Kitchen warfarin (COUMADIN) 5 MG tablet Take 5-7.5 mg by mouth every morning. Takes 1.5 tab (7.5mg ) 4 days a week & take 1 tab (5mg ) on all other days      . Fluticasone-Salmeterol (ADVAIR DISKUS) 250-50 MCG/DOSE AEPB Inhale 1 puff into the lungs every 12 (twelve) hours.        Allergies  Allergen Reactions  . Methimazole (Thiamazole) Itching and Rash    Severe rash    Review of Systems negative except from HPI and PMH  Physical Exam BP 160/100  Pulse 81  Ht 5\' 5"  (1.651 m)  Wt 269 lb 6.4 oz (122.199 kg)  BMI 44.83 kg/m2 Well developed and morbidly obese male in mild respiratory distress HENT normal E scleral and icterus  clear Neck Supple JVP 8-10; carotids brisk and full Clear to ausculation irregularly irregular   rate and rhythm, 2/6 Soft with active bowel sounds No clubbing cyanosis 2+ Edema Alert and oriented, grossly normal motor and sensory function Skin Warm and Dry    Assessment and  Plan

## 2012-07-13 NOTE — Progress Notes (Signed)
Utilization Review Completed.Cody Faulkner T2/10/2012   

## 2012-07-13 NOTE — Interval H&P Note (Signed)
History and Physical Interval Note:  07/13/2012 10:09 AM  Cody Faulkner  has presented today for surgery, with the diagnosis of a-fib  The various methods of treatment have been discussed with the patient and family. After consideration of risks, benefits and other options for treatment, the patient has consented to  Procedure(s) (LRB) with comments: TRANSESOPHAGEAL ECHOCARDIOGRAM (TEE) (N/A) as a surgical intervention .  The patient's history has been reviewed, patient examined, no change in status, stable for surgery.  I have reviewed the patient's chart and labs.  Questions were answered to the patient's satisfaction.     [name]

## 2012-07-13 NOTE — Interval H&P Note (Signed)
History and Physical Interval Note:  07/13/2012 10:47 AM  Cody Faulkner  has presented today for surgery, with the diagnosis of AFib  The various methods of treatment have been discussed with the patient and family. After consideration of risks, benefits and other options for treatment, the patient has consented to  Procedure(s) (LRB) with comments: IMPLANTABLE CARDIOVERTER DEFIBRILLATOR REVISION (N/A) as a surgical intervention .  The patient's history has been reviewed, patient examined, no change in status, stable for surgery.  I have reviewed the patient's chart and labs.  Questions were answered to the patient's satisfaction.     Sherryl Manges  Tee no clot,  LV function normalized.  VT report revieewed Dr Leonia Reeves 2006  RBBB baseline with LBBB WCT consistent with VT  INR @ 3.0 so will proceed with venogram, revision and generator replacement if vein patent;  If not, will review with normal lv function, and anticpate DCCV regardles

## 2012-07-13 NOTE — CV Procedure (Signed)
See final TEE report in [employer]; Normal LV function; no LAA identified. [name]

## 2012-07-14 ENCOUNTER — Encounter (HOSPITAL_COMMUNITY): Payer: Self-pay | Admitting: Cardiology

## 2012-07-14 ENCOUNTER — Ambulatory Visit (HOSPITAL_COMMUNITY): Payer: Medicare Other | Admitting: Certified Registered Nurse Anesthetist

## 2012-07-14 ENCOUNTER — Ambulatory Visit (HOSPITAL_COMMUNITY): Payer: Medicare Other

## 2012-07-14 ENCOUNTER — Encounter (HOSPITAL_COMMUNITY): Payer: Self-pay | Admitting: Certified Registered Nurse Anesthetist

## 2012-07-14 DIAGNOSIS — I4891 Unspecified atrial fibrillation: Secondary | ICD-10-CM

## 2012-07-14 LAB — BASIC METABOLIC PANEL
BUN: 20 mg/dL (ref 6–23)
BUN: 20 mg/dL (ref 6–23)
CO2: 26 mEq/L (ref 19–32)
CO2: 26 mEq/L (ref 19–32)
Calcium: 9.1 mg/dL (ref 8.4–10.5)
Chloride: 105 mEq/L (ref 96–112)
Creatinine, Ser: 1.3 mg/dL (ref 0.50–1.35)
GFR calc Af Amer: 58 mL/min — ABNORMAL LOW (ref >90)
GFR calc non Af Amer: 55 mL/min — ABNORMAL LOW (ref >90)
Glucose, Bld: 112 mg/dL — ABNORMAL HIGH (ref 70–99)
Glucose, Bld: 86 mg/dL (ref 70–99)
Potassium: 3.7 mEq/L (ref 3.5–5.1)

## 2012-07-14 LAB — PROTIME-INR: INR: 3.58 — ABNORMAL HIGH (ref 0.00–1.49)

## 2012-07-14 MED ORDER — SODIUM CHLORIDE 0.9 % IV SOLN: INTRAVENOUS | Status: AC

## 2012-07-14 MED ORDER — SODIUM CHLORIDE 0.9 % IV SOLN: 250.0000 mL | INTRAVENOUS | Status: DC

## 2012-07-14 MED ORDER — FUROSEMIDE 10 MG/ML IJ SOLN
40.0000 mg | Freq: Once | INTRAMUSCULAR | Status: AC
  Administered 2012-07-14: 40 mg via INTRAVENOUS
  Filled 2012-07-14: qty 4

## 2012-07-14 MED ORDER — IBUTILIDE FUMARATE 1 MG/10ML IV SOLN
1.0000 mg | Freq: Once | INTRAVENOUS | Status: DC
  Filled 2012-07-14: qty 10

## 2012-07-14 MED ORDER — SODIUM CHLORIDE 0.9 % IJ SOLN: 3.0000 mL | Freq: Two times a day (BID) | INTRAMUSCULAR | Status: DC

## 2012-07-14 MED ORDER — WARFARIN SODIUM 5 MG PO TABS: 5.0000 mg | ORAL_TABLET | ORAL | Status: DC

## 2012-07-14 MED ORDER — SODIUM CHLORIDE 0.9 % IJ SOLN: 3.0000 mL | INTRAMUSCULAR | Status: DC | PRN

## 2012-07-14 MED ORDER — PROPOFOL 10 MG/ML IV BOLUS
INTRAVENOUS | Status: DC | PRN
  Administered 2012-07-14: 80 mg via INTRAVENOUS

## 2012-07-14 MED ORDER — IBUTILIDE FUMARATE 1 MG/10ML IV SOLN
1.0000 mg | Freq: Once | INTRAVENOUS | Status: AC
  Administered 2012-07-14: 1 mg via INTRAVENOUS
  Filled 2012-07-14: qty 10

## 2012-07-14 MED ORDER — WARFARIN SODIUM 2.5 MG PO TABS
2.5000 mg | ORAL_TABLET | Freq: Once | ORAL | Status: AC
  Administered 2012-07-14: 2.5 mg via ORAL
  Filled 2012-07-14: qty 1

## 2012-07-14 MED ORDER — PATIENT'S GUIDE TO USING COUMADIN BOOK
Freq: Once | Status: AC
  Administered 2012-07-14: 16:00:00
  Filled 2012-07-14: qty 1

## 2012-07-14 MED ORDER — POTASSIUM CHLORIDE CRYS ER 20 MEQ PO TBCR: 40.0000 meq | EXTENDED_RELEASE_TABLET | Freq: Two times a day (BID) | ORAL | Status: DC

## 2012-07-14 MED ORDER — AMIODARONE HCL 200 MG PO TABS
200.0000 mg | ORAL_TABLET | Freq: Every day | ORAL | Status: DC
  Administered 2012-07-14: 200 mg via ORAL
  Filled 2012-07-14: qty 1

## 2012-07-14 MED ORDER — SODIUM CHLORIDE 0.9 % IV SOLN
INTRAVENOUS | Status: DC | PRN
  Administered 2012-07-14: 11:00:00 via INTRAVENOUS

## 2012-07-14 NOTE — Transfer of Care (Signed)
Immediate Anesthesia Transfer of Care Note  Patient: Cody Faulkner  Procedure(s) Performed: * No procedures listed *  Patient Location: Nursing Unit  Anesthesia Type:General  Level of Consciousness: awake, alert  and oriented  Airway & Oxygen Therapy: Patient Spontanous Breathing and Patient connected to nasal cannula oxygen  Post-op Assessment: Report given to PACU RN and Post -op Vital signs reviewed and stable  Post vital signs: Reviewed and stable  Complications: No apparent anesthesia complications

## 2012-07-14 NOTE — Anesthesia Postprocedure Evaluation (Signed)
  Anesthesia Post-op Note  Patient: Cody Faulkner  Procedure(s) Performed: * No procedures listed *  Patient Location: Nursing Unit  Anesthesia Type:General  Level of Consciousness: awake, alert  and oriented  Airway and Oxygen Therapy: Patient Spontanous Breathing and Patient connected to nasal cannula oxygen  Post-op Pain: none  Post-op Assessment: Post-op Vital signs reviewed, Patient's Cardiovascular Status Stable, Respiratory Function Stable and Patent Airway  Post-op Vital Signs: Reviewed and stable  Complications: No apparent anesthesia complications

## 2012-07-14 NOTE — Procedures (Signed)
EP procedure Note   Pre procedure Diagnosis:  Persistent Atrial fibrillation Post procedure Diagnosis:  Same  Procedures:  Electrical cardioversion  Description:  Informed, written consent was obtained for cardioversion.  Adequate IV acces and airway support were assured.  1mg  of ibutilide was infused over 10 minutes.  No significant QT prolongation was observed.  The remained in afib however.  The patient was adequately sedated with intravenous propofol as outlined in the anesthesia report.   She was successfully cardioverted to sinus rhythm with a single synchronized biphasic 200J shock delivered with cardioversion electrodes placed in the anterior/posterior configuration.  She remains in sinus rhythm thereafter.  There were no early apparent complications.  Conclusions:  1.  Successful cardioversion of afib to sinus rhythm 2.  No early apparent complications.   Fayrene Fearing Tiffany Talarico,MD 11:36 AM 07/14/2012

## 2012-07-14 NOTE — Anesthesia Preprocedure Evaluation (Addendum)
Anesthesia Evaluation  Patient identified by MRN, date of birth, ID band Patient awake    History of Anesthesia Complications Negative for: history of anesthetic complications  Airway Mallampati: III      Dental  (+) Edentulous Upper, Partial Lower and Dental Advisory Given   Pulmonary shortness of breath, with exertion and at rest, asthma , sleep apnea and Continuous Positive Airway Pressure Ventilation , pneumonia -, resolved,    Pulmonary exam normal       Cardiovascular hypertension, Pt. on medications and Pt. on home beta blockers + CAD and +CHF + dysrhythmias Atrial Fibrillation + pacemaker + Cardiac Defibrillator Rhythm:Irregular Rate:Normal     Neuro/Psych PSYCHIATRIC DISORDERS Depression    GI/Hepatic   Endo/Other  Hypothyroidism   Renal/GU Renal disease     Musculoskeletal   Abdominal   Peds  Hematology   Anesthesia Other Findings   Reproductive/Obstetrics                          Anesthesia Physical Anesthesia Plan  ASA: III  Anesthesia Plan: General   Post-op Pain Management:    Induction: Intravenous  Airway Management Planned: Mask  Additional Equipment:   Intra-op Plan:   Post-operative Plan:   Informed Consent: I have reviewed the patients History and Physical, chart, labs and discussed the procedure including the risks, benefits and alternatives for the proposed anesthesia with the patient or authorized representative who has indicated his/her understanding and acceptance.     Plan Discussed with: CRNA, Anesthesiologist and Surgeon  Anesthesia Plan Comments:         Anesthesia Quick Evaluation

## 2012-07-14 NOTE — Discharge Summary (Signed)
ELECTROPHYSIOLOGY PROCEDURE DISCHARGE SUMMARY    Patient ID: Cody Faulkner,  MRN: [id number], DOB/AGE: 1946-04-16 67 y.o.  Admit date: 07/13/2012 Discharge date: 07/14/2012  Primary Care Physician: Burnell Blanks, MD Primary Cardiologist: Viann Fish, MD Electrophysiologist: Sherryl Manges, MD  Primary Discharge Diagnosis:  NICM with ICD at [facility] and [phone] lead on manufacturer recall s/p RV lead revision and ICD generator change this admission  Secondary Discharge Diagnosis:  1.  Atrial fibrillation 2.  Hypertension 3.  Sleep apnea 4.  Depression 5.  Single vessel CAD with moderate severity involving the circumflex coronary artery which is out of proportion to the degree of left ventricular dysfunction. 6.  Ventricular tachycardia 7.  Hypothyroidism  Procedures This Admission:  1.  TEE on 07-13-12 demonstrating an EF of 55-60% with no left atrial thrombus 2.  ICD gen change and RV lead revision on 07-13-12 with Dr Graciela Husbands. The patient received a 6935 RV lead with a Medtronic Evera ICD.  Cardioversion was attempted through the device and externally at time of procedure, but both failed to convert to sinus rhythm from atrial fibrillation. 3.  Ibutelide infusion and DCCV on 07-14-2012 by Dr Johney Frame which successfully restored sinus rhythm.   Brief HPI: [name] is a 67 y.o. male who was seen in followup for an ICD implanted with an ICD in 2007 for wide-complex tachycardia in the setting of nonischemic cardiomyopathy. He is moderate exercise intolerance. He is short of breath at less than 100 yards. He has orthopnea and some peripheral edema.  He sees Dr. Donnie Aho 2-3 times a year. He has been sent certified letters on our behalf in the past. He says he has seen Dr. Donnie Aho in the last few months. Somebody has spoken to his device in the last couple of months. It may have been done remotely. He is now in atrial fibrillation and has significant complaints of exercise intolerance. He also underwent a  sleep study in May which was significantly abnormal. He has not had followup with pulmonary here today but that is now scheduled for next week.  His device has reached ERI. He has a 6949-lead which will need to be replaced. He is on Coumadin for his atrial fibrillation and we'll need to identify his INRs.  Risks, benefits, and alternatives to ICD generator change with RV lead revision and cardioversion were discussed with the patient who wished to proceed.   Hospital Course:  The patient was admitted and underwent implantation of a Medtronic Evera ICD with new RV lead with details as outlined above.   He was monitored on telemetry overnight which demonstrated atrial fibrillation.  Ibutelide facilitated cardioversion was carried out on 07-14-2012 which restored sinus rhythm.  Left chest was without hematoma or ecchymosis.  The device was interrogated and found to be functioning normally.  CXR was obtained and demonstrated no pneumothorax status post device implantation.  Wound care, arm mobility, and restrictions were reviewed with the patient.  Dr Graciela Husbands examined the patient and considered them stable for discharge to home.    Discharge Vitals: Blood pressure 98/65, pulse 60, temperature 97.8 F (36.6 C), temperature source Oral, resp. rate 20, height 5\' 4"  (1.626 m), weight 263 lb 7.2 oz (119.5 kg), SpO2 96.00%.    Labs:   Lab Results  Component Value Date   WBC 7.8 07/11/2012   HGB 13.5 07/11/2012   HCT 40.7 07/11/2012   MCV 85.1 07/11/2012   PLT 246.0 07/11/2012     Lab 07/14/12 0811  NA 138  K 3.6  CL 104  CO2 26  BUN 20  CREATININE 1.30  CALCIUM 9.1  PROT --  BILITOT --  ALKPHOS --  ALT --  AST --  GLUCOSE 86   Discharge Medications:    Medication List     As of 07/14/2012  3:54 PM    TAKE these medications         acetaminophen 500 MG tablet   Commonly known as: TYLENOL   Take 1,000 mg by mouth every 6 (six) hours as needed.      albuterol 108 (90 BASE) MCG/ACT inhaler    Commonly known as: PROVENTIL HFA;VENTOLIN HFA   Inhale 2 puffs into the lungs every 4 (four) hours as needed. For shortness of breath      albuterol 108 (90 BASE) MCG/ACT inhaler   Commonly known as: PROVENTIL HFA;VENTOLIN HFA   Inhale 2 puffs into the lungs every 4 (four) hours as needed. For shortness of breath      allopurinol 100 MG tablet   Commonly known as: ZYLOPRIM   Take 100 mg by mouth daily.      aspirin EC 81 MG tablet   Take 81 mg by mouth daily.      bisoprolol 10 MG tablet   Commonly known as: ZEBETA   Take 10 mg by mouth daily.      budesonide-formoterol 160-4.5 MCG/ACT inhaler   Commonly known as: SYMBICORT   Inhale 2 puffs into the lungs 2 (two) times daily.      fenofibrate micronized 134 MG capsule   Commonly known as: LOFIBRA   Take 134 mg by mouth daily before breakfast.      furosemide 40 MG tablet   Commonly known as: LASIX   Take 40 mg by mouth 2 (two) times daily.      hydrALAZINE 25 MG tablet   Commonly known as: APRESOLINE   Take 25 mg by mouth 3 (three) times daily.      isosorbide mononitrate 60 MG 24 hr tablet   Commonly known as: IMDUR   Take 60 mg by mouth daily.      levothyroxine 100 MCG tablet   Commonly known as: SYNTHROID, LEVOTHROID   Take 100 mcg by mouth daily.      loratadine 10 MG tablet   Commonly known as: CLARITIN   Take 10 mg by mouth every morning.      potassium chloride SA 20 MEQ tablet   Commonly known as: K-DUR,KLOR-CON   Take 20 mEq by mouth every other day.      warfarin 5 MG tablet   Commonly known as: COUMADIN   Take 5-7.5 mg by mouth daily. Sunday, Tuesday, Thursday, Saturday 5mg   All other days 7.5mg          Disposition:  Discharge Orders    Future Appointments: Provider: Department: Dept Phone: Center:   07/28/2012 11:00 AM [facility] [employer] Main Office [place]) [phone] [facility]   10/25/2012 3:45 PM Duke Salvia, MD [facility] Main Office [place])  [phone] [facility]     Future Orders Please Complete By Expires   Diet - low sodium heart healthy      Increase activity slowly        Follow-up Information    Follow up with [facility] On 07/28/2012. (Device Clinic - wound check and device check at 11:00 am)    Contact information:   [address] [place] [address]  Follow up with Sherryl Manges, MD. On 10/25/2012. (at 3:45 pm)    Contact information:   [address] [place] [address] [phone]          Duration of Discharge Encounter: Greater than 30 minutes including physician time.  Signed, Gypsy Balsam, RN, BSN 07/14/2012, 3:54 PM

## 2012-07-14 NOTE — Preoperative (Signed)
Beta Blockers   Reason not to administer Beta Blockers:Not Applicable 

## 2012-07-14 NOTE — Progress Notes (Signed)
Patient Name: Cody Faulkner Date of Encounter: 07/14/2012     Active Problems:  [phone] Lead  Atrial fibrillation  Dilated cardiomyopathy  Chronic systolic heart failure  Ventricular tachycardia  Hypothyroidism  Long-term (current) use of anticoagulants   SUBJECTIVE   -Feels OK, no chest pain, has mild SOB. No fever or chills.  -INR= 3.58  CURRENT MEDS Scheduled Meds:    . allopurinol  100 mg Oral Daily  . aspirin  81 mg Oral Daily  . bisoprolol  10 mg Oral Daily  . budesonide-formoterol  2 puff Inhalation BID  . chlorhexidine  60 mL Topical Once  . furosemide  40 mg Oral Daily  . hydrALAZINE  25 mg Oral TID  . isosorbide mononitrate  60 mg Oral Daily  . levothyroxine  100 mcg Oral Daily  . loratadine  10 mg Oral q morning - 10a  . potassium chloride SA  20 mEq Oral Daily  . sodium chloride  3 mL Intravenous Q12H  . warfarin  5 mg Oral Custom  . warfarin  7.5 mg Oral Q M,W,F-1800  . Warfarin - Physician Dosing Inpatient   Does not apply q1800   Continuous Infusions:    . sodium chloride    . sodium chloride     PRN Meds:.acetaminophen, albuterol, ondansetron (ZOFRAN) IV, sodium chloride  OBJECTIVE  Filed Vitals:   07/14/12 0400 07/14/12 0500 07/14/12 0600 07/14/12 0700  BP: 120/80 115/85  100/55  Pulse: 89 76 82 99  Temp: 97.8 F (36.6 C)     TempSrc: Oral     Resp: 17 18 14 13   Height:      Weight:      SpO2: 94% 93% 94% 94%    Intake/Output Summary (Last 24 hours) at 07/14/12 0744 Last data filed at 07/14/12 0600  Gross per 24 hour  Intake    840 ml  Output    975 ml  Net   -135 ml   CVP:  Filed Weights   07/13/12 1300  Weight: 263 lb 7.2 oz (119.5 kg)    PHYSICAL EXAM  General: Pleasant, NAD. Neuro: Alert and oriented X 3. Moves all extremities spontaneously. Psych: Normal affect. HEENT:  Normal  Neck: Supple without bruits or JVD. Lungs:  Resp regular and unlabored, CTA. Heart: irregular RR no s3, s4, or murmurs. Abdomen: Soft,  non-tender, non-distended, BS + x 4.  Extremities: No clubbing, cyanosis or edema. DP/PT/Radials 2+ and equal bilaterally.  Accessory Clinical Findings   Lab 07/14/12 0430 07/13/12 0946 07/11/12 0829  INR 3.58* 3.01* 2.7*     CBC  Basename 07/11/12 0829  WBC 7.8  NEUTROABS 4.6  HGB 13.5  HCT 40.7  MCV 85.1  PLT 246.0   Basic Metabolic Panel  Basename 07/14/12 0430 07/11/12 0829  NA 139 142  K 3.7 3.9  CL 105 106  CO2 26 30  GLUCOSE 112* 93  BUN 20 21  CREATININE 1.40* 1.6*  CALCIUM 9.0 9.4  MG 2.0 --  PHOS -- --     Lab 07/14/12 0430 07/11/12 0829  NA 139 142  K 3.7 3.9  CL 105 106  CO2 26 30  GLUCOSE 112* 93  BUN 20 21  CREATININE 1.40* 1.6*  CALCIUM 9.0 9.4  MG 2.0 --  PHOS -- --     Liver Function Tests No results found for this basename: AST:2,ALT:2,ALKPHOS:2,BILITOT:2,PROT:2,ALBUMIN:2 in the last 72 hours No results found for this basename: LIPASE:2,AMYLASE:2 in the last 72 hours Cardiac  Enzymes No results found for this basename: CKTOTAL:3,CKMB:3,CKMBINDEX:3,TROPONINI:3 in the last 72 hours BNP No components found with this basename: POCBNP:3 D-Dimer No results found for this basename: DDIMER:2 in the last 72 hours Hemoglobin A1C No results found for this basename: HGBA1C in the last 72 hours Fasting Lipid Panel No results found for this basename: CHOL,HDL,LDLCALC,TRIG,CHOLHDL,LDLDIRECT in the last 72 hours Thyroid Function Tests No results found for this basename: TSH,T4TOTAL,FREET3,T3FREE,THYROIDAB in the last 72 hours  TELE: A.fib, HR is about 85/min  ECG:   EEG on 07/13/12  - Left ventricle: Systolic function was normal. The   estimated ejection fraction was in the range of 55% to   60%. Wall motion was normal; there were no regional wall   motion abnormalities. - Aortic valve: No evidence of vegetation. Mild   regurgitation. - Mitral valve: No evidence of vegetation. Mild   regurgitation. - Left atrium: The atrium was mildly  dilated. No evidence of   thrombus in the atrial cavity or appendage. - Right atrium: The atrium was mildly dilated. - Atrial septum: No defect or patent foramen ovale was   identified. - Tricuspid valve: No evidence of vegetation. - Pulmonic valve: No evidence of vegetation.   Radiology/Studies  No results found.  ASSESSMENT AND PLAN  Josha Weekley is a 67 y.o. male with PMH of s/p of ICD in 2007,  Dilated cardiomyopathy,  Hypothyroidism, Atrial fibrillation, Ventricular tachycardia, Chronic systolic heart failure, who presents for 6949-lead replacement.   He has seen Dr. Donnie Aho in the last few months. Her has short of breath at less than 100 yards. His device has reached ERI. He has a 6949-lead which will need to be replaced. He is on Coumadin for his atrial fibrillation   # Atrial fibrillation - This has been sustained now for number of months. He failed amiodarone. His atrial fibrillation has been associated with some degree of rapid ventricular response prompting inappropriate device therapy.    07/13/12, Performed Contrast venogram, explantation of device, insertion of new lead, insertion of new device, high-voltage testing; DC cardioversion. But patient is still on A. Fib.  - Consider ibutilide facilitated cardioversion in the morning - Coumadin has been therapeutic. - Incrased his ZeBeta from 10 mg. - Monitor INR daily  # Ventricular tachycardia:  No intercurrent VT  # Medtronic ICD:  Device has reached ERI. He'll need to be replaced. He also has a 6949-lead which will need to be replaced also. He agrees and is willing to proceed.    # Complex sleep apnea syndrome:  He has not followed up with Dr. Craige Cotta. This is scheduled for next week.  # Dilated cardiomyopathy: EF 55-60%. Stable. -continue: lasix, hydralazine, Imdure,   # CAD (coronary artery disease)nonobstructive :  stalbe -continue ASA and Zebeta   #: hypothyroidism: TSH was 0.127 on 06/29/11. Currently on synthroid  100 Mcg daily.  -will check TSH    Signed, Lorretta Harp NP

## 2012-07-14 NOTE — Progress Notes (Signed)
Pt with persistent atrial fibrillation despite medical therapy with amiodarone.  He is symptomatic with his afib.  Cardioversion yesterday was unsuccessful.  I agree with Dr Graciela Husbands that ibutilide infusion with possible cardioversion is warranted at this time to restore sinus rhythm. Risks, benefits, and alternatives to the procedure were discussed at length with patient who wishes to proceed.   Fayrene Fearing Lashya Passe,MD

## 2012-07-14 NOTE — Progress Notes (Signed)
  Patient Name: Cody Faulkner      SUBJECTIVE: without sign sob or chest pain  Past Medical History  Diagnosis Date  . Atrial fibrillation     Cardioversion 2007  . Hypertension   . OSA (obstructive sleep apnea)        . Depression   . Gouty arthritis   . CAD (coronary artery disease)     predominantly single vessel  . Ventricular tachycardia     s/p defibrillator-Medtronic EnTrust [id number]  . Dilated cardiomyopathy     s/p defibrillator Medtronic EnTrust [phone]  . Asthma   . 9604 Lead   . Implantable Defibrillator     Medtronic Entrust  . Chronic systolic heart failure   . Hypothyroidism   . Acute renal failure 06/06/2011  . Solitary kidney 06/05/2006  . Gout   . CHF (congestive heart failure)   . Pneumonia, community acquired     bilateral bibasilar   . Shortness of breath on exertion     PHYSICAL EXAM Filed Vitals:   07/14/12 0400 07/14/12 0500 07/14/12 0600 07/14/12 0700  BP: 120/80 115/85  100/55  Pulse: 89 76 82 99  Temp: 97.8 F (36.6 C)     TempSrc: Oral     Resp: 17 18 14 13   Height:      Weight:      SpO2: 94% 93% 94% 94%    Well developed and nourished in no acute distress HENT normal Neck supple with JVP-flat Carotids brisk and full without bruits Clear Irregularly irregular rate and rhythm with controlled ventricular response, no murmurs or gallops Abd-soft with active BS without hepatomegaly No Clubbing cyanosis edema Skin-warm and dry A & Oriented  Grossly normal sensory and motor function  TELEMETRY: Reviewed telemetry pt in  af:    Intake/Output Summary (Last 24 hours) at 07/14/12 0753 Last data filed at 07/14/12 0600  Gross per 24 hour  Intake    840 ml  Output    975 ml  Net   -135 ml    LABS: Basic Metabolic Panel:  Lab 07/14/12 5409 07/11/12 0829  NA 139 142  K 3.7 3.9  CL 105 106  CO2 26 30  GLUCOSE 112* 93  BUN 20 21  CREATININE 1.40* 1.6*  CALCIUM 9.0 9.4  MG 2.0 --  PHOS -- --   Cardiac Enzymes: No results  found for this basename: CKTOTAL:3,CKMB:3,CKMBINDEX:3,TROPONINI:3 in the last 72 hours CBC:  Lab 07/11/12 0829  WBC 7.8  NEUTROABS 4.6  HGB 13.5  HCT 40.7  MCV 85.1  PLT 246.0   PROTIME:  Basename 07/14/12 0430 07/13/12 0946 07/11/12 0829  LABPROT 33.7* 29.6* 27.7*  INR 3.58* 3.01* 2.7*     Device Interrogation  Normal device funciton  ASSESSMENT AND PLAN:  Patient Active Hospital Problem List: [phone] Lead (- replaced )   Atrial fibrillation () persistent   Dilated cardiomyopathy ()     Ventricular tachycardia ()   Will continue amio, ibutilide infusion facilitated cardioversion for this afternoon  willreplete k and continue lasix for diuresis  Anticipate discharge  Signed, Sherryl Manges MD  07/14/2012

## 2012-07-26 ENCOUNTER — Ambulatory Visit (INDEPENDENT_AMBULATORY_CARE_PROVIDER_SITE_OTHER): Payer: Medicare Other | Admitting: *Deleted

## 2012-07-26 ENCOUNTER — Other Ambulatory Visit: Payer: Self-pay

## 2012-07-26 ENCOUNTER — Encounter: Payer: Self-pay | Admitting: Internal Medicine

## 2012-07-26 DIAGNOSIS — Z95810 Presence of automatic (implantable) cardiac defibrillator: Secondary | ICD-10-CM

## 2012-07-26 DIAGNOSIS — I420 Dilated cardiomyopathy: Secondary | ICD-10-CM

## 2012-07-26 DIAGNOSIS — I428 Other cardiomyopathies: Secondary | ICD-10-CM

## 2012-07-26 NOTE — Progress Notes (Signed)
Pt seen in clinic for follow up newly implanted ICD.  Wound well healed.   No complaints of chest pain, shortness of breath, dizziness, palpitations, or shocks.  Device functioning normally at this time.  For full details, see PaceArt report.  No programming changes made today.  Plan to follow up in 3 months with Dr Graciela Husbands.   Gypsy Balsam, RN, BSN 07/26/2012 2:22 PM

## 2012-07-28 ENCOUNTER — Ambulatory Visit: Payer: Medicare Other

## 2012-08-01 LAB — ICD DEVICE OBSERVATION
AL AMPLITUDE: 1 mv
AL IMPEDENCE ICD: 399 Ohm
ATRIAL PACING ICD: 93.4 pct
RV LEAD IMPEDENCE ICD: 551 Ohm
RV LEAD THRESHOLD: 0.5 V
TZON-0003SLOWVT: 350.8 ms
VENTRICULAR PACING ICD: 6.8 pct

## 2012-10-25 ENCOUNTER — Encounter: Payer: Self-pay | Admitting: Internal Medicine

## 2012-10-25 ENCOUNTER — Ambulatory Visit (INDEPENDENT_AMBULATORY_CARE_PROVIDER_SITE_OTHER): Payer: Medicare Other | Admitting: Internal Medicine

## 2012-10-25 VITALS — BP 136/83 | HR 102 | Ht 64.0 in | Wt 272.2 lb

## 2012-10-25 DIAGNOSIS — I420 Dilated cardiomyopathy: Secondary | ICD-10-CM

## 2012-10-25 DIAGNOSIS — Z95810 Presence of automatic (implantable) cardiac defibrillator: Secondary | ICD-10-CM

## 2012-10-25 DIAGNOSIS — I472 Ventricular tachycardia: Secondary | ICD-10-CM

## 2012-10-25 DIAGNOSIS — I5032 Chronic diastolic (congestive) heart failure: Secondary | ICD-10-CM

## 2012-10-25 DIAGNOSIS — I4891 Unspecified atrial fibrillation: Secondary | ICD-10-CM

## 2012-10-25 DIAGNOSIS — I428 Other cardiomyopathies: Secondary | ICD-10-CM

## 2012-10-25 LAB — ICD DEVICE OBSERVATION
AL AMPLITUDE: 1.3 mv
AL IMPEDENCE ICD: 418 Ohm
ATRIAL PACING ICD: 88.7 pct
DEV-0020ICD: NEGATIVE
HV IMPEDENCE: 73 Ohm
RV LEAD AMPLITUDE: 10.4 mv
RV LEAD IMPEDENCE ICD: 589 Ohm
RV LEAD THRESHOLD: 0.5 v
TZON-0003FASTVT: 240 ms
TZON-0003SLOWVT: 350.8 ms
VENTRICULAR PACING ICD: 1.1 pct

## 2012-10-25 NOTE — Progress Notes (Signed)
Patient Care Team: Ailene Ravel as PCP - General (Family Medicine)   HPI  Cody Faulkner is a 67 y.o. male Seen in followup for an ICD implanted with an ICD in 2007 for wide-complex tachycardia in the setting of nonischemic cardiomyopathy. He had a 6949 lead replaced at generator replacement with 5935 lead--feb 2014   Stable  Echo EF 2014>> normal     He sees Dr. Donnie Aho 2-3 times a year. He has been sent certified letters on our behalf in the past.     .   Past Medical History  Diagnosis Date  . Atrial fibrillation     Cardioversion 2007  . Hypertension   . OSA (obstructive sleep apnea)        . Depression   . Gouty arthritis   . CAD (coronary artery disease)     predominantly single vessel  . Ventricular tachycardia     s/p defibrillator-Medtronic EnTrust [id number]  . Dilated cardiomyopathy     s/p defibrillator Medtronic EnTrust [phone]  . Asthma   . 9811 Lead   . Implantable Defibrillator     Medtronic Entrust  . Chronic systolic heart failure   . Hypothyroidism   . Acute renal failure 06/06/2011  . Solitary kidney 06/05/2006  . Gout   . CHF (congestive heart failure)   . Pneumonia, community acquired     bilateral bibasilar   . Shortness of breath on exertion     Past Surgical History  Procedure Laterality Date  . Cardiac defibrillator placement  2007    Medtronic EnTrust [phone]  . Nephrectomy  1976    Right  . Cardiac catheterization  2007    60% Stenosis mid-CFX  . Knee arthroscopy      left  . Cardioversion  06/18/2011    Procedure: CARDIOVERSION;  Surgeon: Darden Palmer., MD;  Location: [facility] OR;  Service: Cardiovascular;  Laterality: N/A;  . Tee without cardioversion  07/13/2012    Procedure: TRANSESOPHAGEAL ECHOCARDIOGRAM (TEE);  Surgeon: Lewayne Bunting, MD;  Location: [facility] ENDOSCOPY;  Service: Cardiovascular;  Laterality: N/A;    Current Outpatient Prescriptions  Medication Sig Dispense Refill  . acetaminophen (TYLENOL) 500 MG tablet Take 1,000 mg  by mouth every 6 (six) hours as needed.      Marland Kitchen albuterol (PROVENTIL HFA;VENTOLIN HFA) 108 (90 BASE) MCG/ACT inhaler Inhale 2 puffs into the lungs every 4 (four) hours as needed. For shortness of breath       . albuterol (PROVENTIL HFA;VENTOLIN HFA) 108 (90 BASE) MCG/ACT inhaler Inhale 2 puffs into the lungs every 4 (four) hours as needed. For shortness of breath      . allopurinol (ZYLOPRIM) 100 MG tablet Take 100 mg by mouth daily.      Marland Kitchen aspirin EC 81 MG tablet Take 81 mg by mouth daily.      . budesonide-formoterol (SYMBICORT) 160-4.5 MCG/ACT inhaler Inhale 2 puffs into the lungs 2 (two) times daily.      . fenofibrate micronized (LOFIBRA) 134 MG capsule Take 134 mg by mouth daily before breakfast.       . furosemide (LASIX) 40 MG tablet Take 40 mg by mouth 2 (two) times daily.      . hydrALAZINE (APRESOLINE) 25 MG tablet Take 25 mg by mouth 3 (three) times daily.      . isosorbide mononitrate (IMDUR) 60 MG 24 hr tablet Take 60 mg by mouth daily.      Marland Kitchen loratadine (CLARITIN) 10 MG tablet Take  10 mg by mouth every morning.       . potassium chloride SA (K-DUR,KLOR-CON) 20 MEQ tablet Take 20 mEq by mouth every other day.      . warfarin (COUMADIN) 5 MG tablet Take 5-7.5 mg by mouth daily. Sunday, Tuesday, Thursday, Saturday 5mg  All other days 7.5mg       . bisoprolol (ZEBETA) 10 MG tablet Take 10 mg by mouth daily.      Marland Kitchen levothyroxine (SYNTHROID, LEVOTHROID) 100 MCG tablet Take 100 mcg by mouth daily.       No current facility-administered medications for this visit.    Allergies  Allergen Reactions  . Methimazole (Thiamazole) Itching and Rash    Severe rash    Review of Systems negative except from HPI and PMH  Physical Exam BP 136/83  Pulse 102  Ht 5\' 4"  (1.626 m)  Wt 272 lb 3.2 oz (123.469 kg)  BMI 46.7 kg/m2 Well developed and well nourished in no acute distress HENT normal E scleral and icterus clear Neck Supple JVP flat; carotids brisk and full Clear to ausculation   Regular rate and rhythm, no murmurs gallops or rub Soft with active bowel sounds No clubbing cyanosis Trace Edema Alert and oriented, grossly normal motor and sensory function Skin Warm and Dry    Assessment and  Plan

## 2012-10-25 NOTE — Assessment & Plan Note (Signed)
Recurrent afib have spoken to Dr Billie Lade and he will proceed with cardioversion

## 2012-10-25 NOTE — Assessment & Plan Note (Signed)
The patient's device was interrogated and the information was fully reviewed.  The device was reprogrammed to max longevity

## 2012-10-25 NOTE — Assessment & Plan Note (Signed)
No intercurrent Ventricular tachycardia  

## 2012-10-25 NOTE — Patient Instructions (Addendum)
Your physician wants you to follow-up in: 1 year with Dr. Graciela Husbands. You will receive a reminder letter in the mail two months in advance. If you don't receive a letter, please call our office to schedule the follow-up appointment.  Your physician recommends that you continue on your current medications as directed. Please refer to the Current Medication list given to you today.  Call Dr. York Spaniel office to set up an appointment to discuss cardioversion.

## 2012-10-27 ENCOUNTER — Encounter: Payer: Self-pay | Admitting: Cardiology

## 2012-10-27 DIAGNOSIS — Z8639 Personal history of other endocrine, nutritional and metabolic disease: Secondary | ICD-10-CM | POA: Insufficient documentation

## 2012-10-27 NOTE — Progress Notes (Signed)
Patient ID: MATHEU PLOEGER, male   DOB: September 19, 1945, 67 y.o.   MRN: [id number]  Garrin, Kirwan    Date of visit:  10/27/2012 DOB:  [birth year]    Age:  67 yrs. Medical record number:  [id number]     Account number:  [id number] Primary Care Provider: Burnell Blanks ____________________________ CURRENT DIAGNOSES  1. Arrhythmia-Atrial Fibrillation  2. Cardiomyopathy Idiopathic  3. Hypertensive Heart Disease-Benign without CHF  4. CAD-Native  5. Hypothyroidism  6. History of hyperthyroidism  7. Obesity, morbid (BMI>40)  8. AICD in situ  9. Gout  10. Ventricular tachycardia  11. Asthma  12. Sleep apnea ____________________________ ALLERGIES  amiodarone, Hyperthyroidism ____________________________ MEDICATIONS  1. loratadine 10 mg Tablet, 1 p.o. daily  2. Aspirin Low-Strength 81 mg Tablet, Chewable, 1 p.o. daily  3. furosemide 40 mg Tablet, 1 p.o. daily  4. Tylenol Ex Str Arthritis Pain 500 mg Tablet, PRN  5. hydralazine 25 mg tablet, BID  6. Klor-Con M20 20 mEq tablet,ER particles/crystals, qod  7. Zebeta 5 mg tablet, 1 p.o. daily  8. warfarin 5 mg tablet, Take as directed  9. fenofibrate micronized 134 mg capsule  10. Synthroid 100 mcg tablet, 1 p.o. daily  11. Proventil HFA 90 mcg/actuation HFA aerosol inhaler, PRN  12. triamcinolone acetonide 0.1 % cream, PRN  13. allopurinol 100 mg tablet, 1 p.o. daily  14. bisoprolol fumarate 10 mg tablet, 1 p.o. daily  15. isosorbide mononitrate 60 mg tablet extended release 24 hr, 1 p.o. daily  16. Tylenol 325 mg tablet, PRN  17. Symbicort 160-4.5 mcg/actuation HFA aerosol inhaler, 2 puff bid  18. Cetaphil cream, PRN  19. amiodarone 200 mg tablet, BID ____________________________ CHIEF COMPLAINTS  Dyspnea ____________________________ HISTORY OF PRESENT ILLNESS  Patient seen after a long absence. His ICD reached elective replacement and I sent him to Dr. Graciela Husbands. He was found to be back in atrial fibrillation and underwent a TEE cardioversion but  this was unsuccessful and he later underwent an ibutilide assisted cardioversion and then had changed out his old [phone] lead as well as a new defibrillator implant. He was seen back by Dr. Graciela Husbands earlier this week and was noted to be back in atrial fibrillation. He complains of some dyspnea as well as fatigue and is unaware that his heart is out of rhythm. His INR was therapeutic today.  He does have a prior history of hyperthyroidism but the amiodarone was stopped and he later was treated with methimazole which was stopped because of a rash. He then became hypothyroid following that. Today he complains of erectile dysfunction and wants something for it. He does have moderate dyspnea. He has no PND, orthopnea or significant edema. ____________________________ PAST HISTORY  Past Medical Illnesses:  asthma, gout, sleep apnea, obesity, hypertension, COPD, hyperthyroidism;  Cardiovascular Illnesses:  ventricular tachycardia, cardiomyopathy(dilated), atrial fibrillation, CAD;  Surgical Procedures:  nephrectomy, knee surgery, left;  NYHA Classification:  II;  Cardiology Procedures-Invasive:  cardiac cath (left) June 2007, Medtronic AICD implant June 2007, cardioversion September 2007, AICD implant February 2014, generator change February 2014, cardioversion February 2014;  Cardiology Procedures-Noninvasive:  echocardiogram January 2010, echocardiogram September 2011, TEE February 2014;  Cardiac Cath Results:  normal Left main, no significant disease LAD, 60% stenosis mid CFX, no significant disease RCA;  LVEF of 55% documented via echocardiogram on 05-Mar-202012,  CHADS Score:  2,  ____________________________ CARDIO-PULMONARY TEST DATES EKG Date:  10/27/2012;   Cardiac Cath Date:  11/10/2005;  Echocardiography Date: 07/15/2012;  Chest Xray Date: 06/05/2011;  ____________________________ FAMILY HISTORY Father - unknown; Mother -  deceased;  ____________________________ SOCIAL HISTORY Alcohol Use:  no alcohol  use;  Smoking:  never smoked;  Diet:  regular diet;  Lifestyle:  single;  Exercise:  no regular exercise;  Occupation:  disabled;  Residence:  lives alone;   ____________________________ REVIEW OF SYSTEMS General:  severe obesity, malaise and fatigue, weight gain of approximately 15 lbs  Integumentary:no rashes or new skin lesions. Eyes: wears eye glasses/contact lenses, cataracts Respiratory: denies dyspnea, cough, wheezing or hemoptysis. Cardiovascular:  please review HPI Abdominal: denies dyspepsia, GI bleeding, constipation, or diarrhea Genitourinary-Male: frequency, nocturia, erectile dysfunction  Musculoskeletal:  arthritis of the knees Psychiatric:  depression  ____________________________ PHYSICAL EXAMINATION VITAL SIGNS  Blood Pressure:  124/66 Sitting, Left arm, large cuff  , 120/60 Standing, Left arm and large cuff   Pulse:  78/min. Weight:  271.00 lbs. Height:  66"BMI: 43  Constitutional:  pleasant white male in no acute distress, severely obese Skin:  warm and dry to touch, no apparent skin lesions, or masses noted. Head:  normocephalic, normal hair pattern, no masses or tenderness ENT:  upper dentures present, teeth in poor repair Neck:  supple, no masses, thyromegaly, JVD. Carotid pulses are full and equal bilaterally without bruits. Chest:  normal symmetry, clear to auscultation and percussion., healed ICD incision in the left pectoral area Cardiac:  irregular rhythm, normal S1 and S2, no S3 or S4, no murmurs,clicks, or rub heard Abdomen:  abdomen soft,non-tender, no masses, no hepatospenomegaly, or aneurysm noted Peripheral Pulses:  the femoral,dorsalis pedis, and posterior tibial pulses are full and equal bilaterally with no bruits auscultated. Extremities & Back:  bilateral venous insufficiency changes present, 1+ edema Neurological:  no gross motor or sensory deficits noted, affect appropriate, oriented x3. ____________________________ MOST RECENT LIPID  PANEL 09/13/12  CHOL TOTL 139 mg/dl, LDL 65 calc, HDL 64 mg/dl and TRIGLYCER 50 mg/dl ____________________________ IMPRESSIONS/PLAN  1. Recurrent paroxysmal atrial fibrillation that has now recurred. I spoke with Dr. Leslie Dales as well as Dr. Graciela Husbands about this. Although he stands a risk of developing hyperthyroidism again, we are going to retry the amiodarone and then monitor it closely.  2. History of cardiomyopathy with most recent ejection fraction of around 60%  3. Long-term anticoagulation with warfarin 4. History of hyperthyroidism 5. Morbid obesity  Recommendations:  He is to start amiodarone 200 mg twice a day and I will see him back in 2 weeks. If he remains out of rhythm then consider cardioversion.I asked him to have laboratory work as noted below it he said that he would go to his family physician and have this done there and sent here. EKG shows atrial fibrillation with rapid response ____________________________ TODAYS ORDERS  1. Comprehensive Metabolic Panel: Today  2. TSH: Today  3. Complete Blood Count: Today  4. Return Visit: 2 weeks  5. 12 Lead EKG: 2 weeks  6. 12 Lead EKG: Today                       ____________________________ Cardiology Physician:  Darden Palmer MD [facility]

## 2012-12-01 ENCOUNTER — Other Ambulatory Visit: Payer: Self-pay | Admitting: Cardiology

## 2012-12-01 DIAGNOSIS — Z8639 Personal history of other endocrine, nutritional and metabolic disease: Secondary | ICD-10-CM

## 2012-12-01 DIAGNOSIS — I4891 Unspecified atrial fibrillation: Secondary | ICD-10-CM

## 2012-12-01 NOTE — H&P (Signed)
Cody Faulkner    Date of visit:  12/01/2012 DOB:  15-Jun-1945    Age:  67 yrs. Medical record number:  [id number]     Account number:  [id number] Primary Care Provider: Burnell Blanks ____________________________ CURRENT DIAGNOSES  1. Arrhythmia-Atrial Fibrillation  2. Hypertensive Heart Disease-Benign without CHF  3. Cardiomyopathy Idiopathic  4. CAD-Native  5. History of hyperthyroidism  6. Obesity, morbid (BMI>40)  7. Chronic Kidney Disease (Stage 3)  8. AICD in situ  9. Sleep apnea  10. Hypothyroidism  11. Gout  12. Ventricular tachycardia  13. Asthma ____________________________ ALLERGIES  Amiodarone, Hyperthyroidism (over activity of the thyroid gland) ____________________________ MEDICATIONS  1. loratadine 10 mg Tablet, 1 p.o. daily  2. Aspirin Low-Strength 81 mg Tablet, Chewable, 1 p.o. daily  3. furosemide 40 mg Tablet, 1 p.o. daily  4. Tylenol Ex Str Arthritis Pain 500 mg Tablet, PRN  5. hydralazine 25 mg tablet, BID  6. Klor-Con M20 20 mEq tablet,ER particles/crystals, qod  7. warfarin 5 mg tablet, Take as directed  8. fenofibrate micronized 134 mg capsule  9. Synthroid 100 mcg tablet, 1 p.o. daily  10. Proventil HFA 90 mcg/actuation HFA aerosol inhaler, PRN  11. triamcinolone acetonide 0.1 % cream, PRN  12. allopurinol 100 mg tablet, 1 p.o. daily  13. bisoprolol fumarate 10 mg tablet, 1 p.o. daily  14. isosorbide mononitrate 60 mg tablet extended release 24 hr, 1 p.o. daily  15. Tylenol 325 mg tablet, PRN  16. Cetaphil cream, PRN  17. amiodarone 200 mg tablet, BID  18. doxycycline monohydrate 100 mg tablet, 1 p.o. daily ____________________________ CHIEF COMPLAINTS  Leg edema + oozing ____________________________ HISTORY OF PRESENT ILLNESS  Patient seen for cardiac followup and evaluation prior to cardioversion. He has had persistent atrial fibrillation and has been on amiodarone. We're monitoring his thyroid functions very closely. He went to the beach and  comes in today with some cellulitis of his left lower extremity where he had walked around and bumped his leg a good bit and now has erythema and swelling. He is having dyspnea and some swelling and has gained weight since he was here. He has had no bleeding complications from warfarin but his INR is elevated today. He has had no defibrillator discharges. He denies PND or orthopnea. ____________________________ PAST HISTORY  Past Medical Illnesses:  asthma, gout, sleep apnea, obesity, hypertension, COPD, hyperthyroidism;  Cardiovascular Illnesses:  ventricular tachycardia, cardiomyopathy(dilated), atrial fibrillation, CAD;  Surgical Procedures:  nephrectomy, knee surgery, left;  Cardiology Procedures-Invasive:  cardiac cath (left) June 2007, Medtronic AICD implant June 2007, cardioversion September 2007, AICD implant February 2014, generator change February 2014, cardioversion February 2014;  Cardiology Procedures-Noninvasive:  echocardiogram January 2010, echocardiogram September 2011, TEE February 2014;  Cardiac Cath Results:  normal Left main, no significant disease LAD, 60% stenosis mid CFX, no significant disease RCA;  LVEF of 55% documented via echocardiogram on 2020-09-1810,  CHADS Score:  2,  ____________________________ CARDIO-PULMONARY TEST DATES EKG Date:  12/01/2012;   Cardiac Cath Date:  11/10/2005;  Echocardiography Date: 07/15/2012;  Chest Xray Date: 06/05/2011;   ____________________________ FAMILY HISTORY Father -- Unknown Disease Mother -- Unknown Disease, Deceased ____________________________ SOCIAL HISTORY Alcohol Use:  no alcohol use;  Smoking:  never smoked;  Diet:  regular diet;  Lifestyle:  single;  Exercise:  no regular exercise;  Occupation:  disabled;  Residence:  lives alone;   ____________________________ REVIEW OF SYSTEMS General:  severe obesity, malaise and fatigue  Integumentary:no rashes or new skin lesions. Eyes: wears eye  glasses/contact lenses, cataracts  Respiratory: denies dyspnea, cough, wheezing or hemoptysis. Cardiovascular:  please review HPI Abdominal: denies dyspepsia, GI bleeding, constipation, or diarrhea Genitourinary-Male: frequency, nocturia, erectile dysfunction  Musculoskeletal:  arthritis of the knees Psychiatric:  depression  ____________________________ PHYSICAL EXAMINATION VITAL SIGNS  Blood Pressure:  130/80 Sitting, Right arm, large cuff   Pulse:  96/min. Weight:  272.00 lbs. Height:  66"BMI: 44  Constitutional:  pleasant white male in no acute distress, severely obese Skin:  warm and dry to touch, no apparent skin lesions, or masses noted. Head:  normocephalic, normal hair pattern, no masses or tenderness ENT:  upper dentures present, teeth in poor repair Neck:  supple, no masses, thyromegaly, JVD. Carotid pulses are full and equal bilaterally without bruits. Chest:  normal symmetry, clear to auscultation and percussion., healed ICD incision in the left pectoral area Cardiac:  irregular rhythm, normal S1 and S2, no S3 or S4, no murmurs,clicks, or rub heard Peripheral Pulses:  the femoral,dorsalis pedis, and posterior tibial pulses are full and equal bilaterally with no bruits auscultated. Extremities & Back:  bilateral venous insufficiency changes present, 1+ edema, Left lower leg has erythema and some scabs as well as a blister. Neurological:  no gross motor or sensory deficits noted, affect appropriate, oriented x3. ____________________________ MOST RECENT LIPID PANEL 09/13/12  CHOL TOTL 139 mg/dl, LDL 65 calc, HDL 64 mg/dl and TRIGLYCER 50 mg/dl ____________________________ IMPRESSIONS/PLAN  1. Persistent atrial fibrillation for cardioversion 2. Current amiodarone treatment with previous history of hyperthyroidism on amiodarone 3. Morbid obesity 4. Long-term anticoagulation with warfarin 5. Cellulitis of the left lower extremity  Recommendations:  Cardioversion discussed with the patient including risks of  stroke, arrhythmia, death, or anesthesia risks. The patient understands and is willing to proceed. He does have cellulitis and I discussed hygiene and I placed him on doxycycline 100 mg daily and told to still proceed with cardioversion. His INR was somewhat overtherapeutic today and I asked him to hold it for one day but did not want him to hold it longer than that for fear that he would be subtherapeutic prior to cardioversion. Cardioversion is planned for Tuesday. ____________________________ TODAYS ORDERS  1. Comprehensive Metabolic Panel: Today  2. Complete Blood Count: Today  3. TSH: Today  4. Electrical Cardioversion Tuesday: 5 days  5. 12 Lead EKG: Today                       ____________________________ Cardiology Physician:  Darden Palmer MD [facility]

## 2012-12-06 ENCOUNTER — Encounter (HOSPITAL_COMMUNITY)
Admission: RE | Disposition: A | Discharge status: Home / Self Care | Payer: Self-pay | Source: Ambulatory Visit | Attending: Cardiology

## 2012-12-06 ENCOUNTER — Ambulatory Visit (HOSPITAL_COMMUNITY): Payer: Medicare Other | Admitting: Anesthesiology

## 2012-12-06 ENCOUNTER — Ambulatory Visit (HOSPITAL_COMMUNITY)
Admission: RE | Admit: 2012-12-06 | Discharge: 2012-12-06 | Disposition: A | Discharge status: Home / Self Care | Payer: Medicare Other | Source: Ambulatory Visit | Attending: Cardiology | Admitting: Cardiology

## 2012-12-06 ENCOUNTER — Encounter (HOSPITAL_COMMUNITY): Payer: Self-pay

## 2012-12-06 ENCOUNTER — Encounter (HOSPITAL_COMMUNITY): Payer: Self-pay | Admitting: Anesthesiology

## 2012-12-06 DIAGNOSIS — I2510 Atherosclerotic heart disease of native coronary artery without angina pectoris: Secondary | ICD-10-CM | POA: Insufficient documentation

## 2012-12-06 DIAGNOSIS — E785 Hyperlipidemia, unspecified: Secondary | ICD-10-CM | POA: Insufficient documentation

## 2012-12-06 DIAGNOSIS — Z888 Allergy status to other drugs, medicaments and biological substances status: Secondary | ICD-10-CM | POA: Insufficient documentation

## 2012-12-06 DIAGNOSIS — Z95810 Presence of automatic (implantable) cardiac defibrillator: Secondary | ICD-10-CM | POA: Insufficient documentation

## 2012-12-06 DIAGNOSIS — I4891 Unspecified atrial fibrillation: Secondary | ICD-10-CM | POA: Insufficient documentation

## 2012-12-06 DIAGNOSIS — G4730 Sleep apnea, unspecified: Secondary | ICD-10-CM | POA: Insufficient documentation

## 2012-12-06 DIAGNOSIS — Z7982 Long term (current) use of aspirin: Secondary | ICD-10-CM | POA: Insufficient documentation

## 2012-12-06 DIAGNOSIS — M109 Gout, unspecified: Secondary | ICD-10-CM | POA: Insufficient documentation

## 2012-12-06 DIAGNOSIS — I1310 Hypertensive heart and chronic kidney disease without heart failure, with stage 1 through stage 4 chronic kidney disease, or unspecified chronic kidney disease: Secondary | ICD-10-CM | POA: Insufficient documentation

## 2012-12-06 DIAGNOSIS — Z6841 Body Mass Index (BMI) 40.0 and over, adult: Secondary | ICD-10-CM | POA: Insufficient documentation

## 2012-12-06 DIAGNOSIS — I428 Other cardiomyopathies: Secondary | ICD-10-CM | POA: Insufficient documentation

## 2012-12-06 DIAGNOSIS — I4729 Other ventricular tachycardia: Secondary | ICD-10-CM | POA: Insufficient documentation

## 2012-12-06 DIAGNOSIS — I472 Ventricular tachycardia, unspecified: Secondary | ICD-10-CM | POA: Insufficient documentation

## 2012-12-06 DIAGNOSIS — L02419 Cutaneous abscess of limb, unspecified: Secondary | ICD-10-CM | POA: Insufficient documentation

## 2012-12-06 DIAGNOSIS — L03119 Cellulitis of unspecified part of limb: Secondary | ICD-10-CM | POA: Insufficient documentation

## 2012-12-06 DIAGNOSIS — N183 Chronic kidney disease, stage 3 unspecified: Secondary | ICD-10-CM | POA: Insufficient documentation

## 2012-12-06 DIAGNOSIS — J45909 Unspecified asthma, uncomplicated: Secondary | ICD-10-CM | POA: Insufficient documentation

## 2012-12-06 DIAGNOSIS — Z79899 Other long term (current) drug therapy: Secondary | ICD-10-CM | POA: Insufficient documentation

## 2012-12-06 DIAGNOSIS — Z7901 Long term (current) use of anticoagulants: Secondary | ICD-10-CM | POA: Insufficient documentation

## 2012-12-06 HISTORY — PX: CARDIOVERSION: SHX1299

## 2012-12-06 SURGERY — CARDIOVERSION
Anesthesia: General | Wound class: Clean

## 2012-12-06 MED ORDER — PROPOFOL 10 MG/ML IV BOLUS
INTRAVENOUS | Status: DC | PRN
  Administered 2012-12-06: 70 mg via INTRAVENOUS

## 2012-12-06 MED ORDER — SODIUM CHLORIDE 0.9 % IV SOLN
INTRAVENOUS | Status: DC
  Administered 2012-12-06: 500 mL via INTRAVENOUS

## 2012-12-06 NOTE — Anesthesia Postprocedure Evaluation (Signed)
  Anesthesia Post-op Note  Patient: Cody Faulkner  Procedure(s) Performed: Procedure(s): CARDIOVERSION (N/A)  Patient Location: PACU and Endoscopy Unit  Anesthesia Type:MAC  Level of Consciousness: awake, alert , oriented and sedated  Airway and Oxygen Therapy: Patient Spontanous Breathing and Patient connected to nasal cannula oxygen  Post-op Pain: none  Post-op Assessment: Post-op Vital signs reviewed, Patient's Cardiovascular Status Stable, Respiratory Function Stable, Patent Airway and No signs of Nausea or vomiting  Post-op Vital Signs: Reviewed and stable  Complications: No apparent anesthesia complications

## 2012-12-06 NOTE — Interval H&P Note (Signed)
History and Physical Interval Note:  12/06/2012 1:08 PM  Cody Faulkner  has presented today for cardioversion with the diagnosis of afib  The various methods of treatment have been discussed with the patient and family. After consideration of risks, benefits patient agreeable to proceed.  I have reviewed the patient's chart and labs.  Questions were answered to the patient's satisfaction.    Darden Palmer MD [facility]

## 2012-12-06 NOTE — Transfer of Care (Signed)
Immediate Anesthesia Transfer of Care Note  Patient: Cody Faulkner  Procedure(s) Performed: Procedure(s): CARDIOVERSION (N/A)  Patient Location: PACU and Endoscopy Unit  Anesthesia Type:MAC  Level of Consciousness: awake, alert , oriented and sedated  Airway & Oxygen Therapy: Patient Spontanous Breathing and Patient connected to nasal cannula oxygen  Post-op Assessment: Report given to PACU RN, Post -op Vital signs reviewed and stable and Patient moving all extremities  Post vital signs: Reviewed and stable  Complications: No apparent anesthesia complications

## 2012-12-06 NOTE — H&P (View-Only) (Signed)
 Cody Faulkner, Cody Faulkner    Date of visit:  12/01/2012 DOB:  09/12/1945    Age:  66 yrs. Medical record number:  [id number]     Account number:  [id number] Primary Care Provider: HAMRICK, MAURA ____________________________ CURRENT DIAGNOSES  1. Arrhythmia-Atrial Fibrillation  2. Hypertensive Heart Disease-Benign without CHF  3. Cardiomyopathy Idiopathic  4. CAD-Native  5. History of hyperthyroidism  6. Obesity, morbid (BMI>40)  7. Chronic Kidney Disease (Stage 3)  8. AICD in situ  9. Sleep apnea  10. Hypothyroidism  11. Gout  12. Ventricular tachycardia  13. Asthma ____________________________ ALLERGIES  Amiodarone, Hyperthyroidism (over activity of the thyroid gland) ____________________________ MEDICATIONS  1. loratadine 10 mg Tablet, 1 p.o. daily  2. Aspirin Low-Strength 81 mg Tablet, Chewable, 1 p.o. daily  3. furosemide 40 mg Tablet, 1 p.o. daily  4. Tylenol Ex Str Arthritis Pain 500 mg Tablet, PRN  5. hydralazine 25 mg tablet, BID  6. Klor-Con M20 20 mEq tablet,ER particles/crystals, qod  7. warfarin 5 mg tablet, Take as directed  8. fenofibrate micronized 134 mg capsule  9. Synthroid 100 mcg tablet, 1 p.o. daily  10. Proventil HFA 90 mcg/actuation HFA aerosol inhaler, PRN  11. triamcinolone acetonide 0.1 % cream, PRN  12. allopurinol 100 mg tablet, 1 p.o. daily  13. bisoprolol fumarate 10 mg tablet, 1 p.o. daily  14. isosorbide mononitrate 60 mg tablet extended release 24 hr, 1 p.o. daily  15. Tylenol 325 mg tablet, PRN  16. Cetaphil cream, PRN  17. amiodarone 200 mg tablet, BID  18. doxycycline monohydrate 100 mg tablet, 1 p.o. daily ____________________________ CHIEF COMPLAINTS  Leg edema + oozing ____________________________ HISTORY OF PRESENT ILLNESS  Patient seen for cardiac followup and evaluation prior to cardioversion. He has had persistent atrial fibrillation and has been on amiodarone. We're monitoring his thyroid functions very closely. He went to the beach and  comes in today with some cellulitis of his left lower extremity where he had walked around and bumped his leg a good bit and now has erythema and swelling. He is having dyspnea and some swelling and has gained weight since he was here. He has had no bleeding complications from warfarin but his INR is elevated today. He has had no defibrillator discharges. He denies PND or orthopnea. ____________________________ PAST HISTORY  Past Medical Illnesses:  asthma, gout, sleep apnea, obesity, hypertension, COPD, hyperthyroidism;  Cardiovascular Illnesses:  ventricular tachycardia, cardiomyopathy(dilated), atrial fibrillation, CAD;  Surgical Procedures:  nephrectomy, knee surgery, left;  Cardiology Procedures-Invasive:  cardiac cath (left) June 2007, Medtronic AICD implant June 2007, cardioversion September 2007, AICD implant February 2014, generator change February 2014, cardioversion February 2014;  Cardiology Procedures-Noninvasive:  echocardiogram January 2010, echocardiogram September 2011, TEE February 2014;  Cardiac Cath Results:  normal Left main, no significant disease LAD, 60% stenosis mid CFX, no significant disease RCA;  LVEF of 55% documented via echocardiogram on 06/07/2011,  CHADS Score:  2,  ____________________________ CARDIO-PULMONARY TEST DATES EKG Date:  12/01/2012;   Cardiac Cath Date:  11/10/2005;  Echocardiography Date: 07/15/2012;  Chest Xray Date: 06/05/2011;   ____________________________ FAMILY HISTORY Father -- Unknown Disease Mother -- Unknown Disease, Deceased ____________________________ SOCIAL HISTORY Alcohol Use:  no alcohol use;  Smoking:  never smoked;  Diet:  regular diet;  Lifestyle:  single;  Exercise:  no regular exercise;  Occupation:  disabled;  Residence:  lives alone;   ____________________________ REVIEW OF SYSTEMS General:  severe obesity, malaise and fatigue  Integumentary:no rashes or new skin lesions. Eyes: wears eye   glasses/contact lenses, cataracts  Respiratory: denies dyspnea, cough, wheezing or hemoptysis. Cardiovascular:  please review HPI Abdominal: denies dyspepsia, GI bleeding, constipation, or diarrhea Genitourinary-Male: frequency, nocturia, erectile dysfunction  Musculoskeletal:  arthritis of the knees Psychiatric:  depression  ____________________________ PHYSICAL EXAMINATION VITAL SIGNS  Blood Pressure:  130/80 Sitting, Right arm, large cuff   Pulse:  96/min. Weight:  272.00 lbs. Height:  66"BMI: 44  Constitutional:  pleasant white male in no acute distress, severely obese Skin:  warm and dry to touch, no apparent skin lesions, or masses noted. Head:  normocephalic, normal hair pattern, no masses or tenderness ENT:  upper dentures present, teeth in poor repair Neck:  supple, no masses, thyromegaly, JVD. Carotid pulses are full and equal bilaterally without bruits. Chest:  normal symmetry, clear to auscultation and percussion., healed ICD incision in the left pectoral area Cardiac:  irregular rhythm, normal S1 and S2, no S3 or S4, no murmurs,clicks, or rub heard Peripheral Pulses:  the femoral,dorsalis pedis, and posterior tibial pulses are full and equal bilaterally with no bruits auscultated. Extremities & Back:  bilateral venous insufficiency changes present, 1+ edema, Left lower leg has erythema and some scabs as well as a blister. Neurological:  no gross motor or sensory deficits noted, affect appropriate, oriented x3. ____________________________ MOST RECENT LIPID PANEL 09/13/12  CHOL TOTL 139 mg/dl, LDL 65 calc, HDL 64 mg/dl and TRIGLYCER 50 mg/dl ____________________________ IMPRESSIONS/PLAN  1. Persistent atrial fibrillation for cardioversion 2. Current amiodarone treatment with previous history of hyperthyroidism on amiodarone 3. Morbid obesity 4. Long-term anticoagulation with warfarin 5. Cellulitis of the left lower extremity  Recommendations:  Cardioversion discussed with the patient including risks of  stroke, arrhythmia, death, or anesthesia risks. The patient understands and is willing to proceed. He does have cellulitis and I discussed hygiene and I placed him on doxycycline 100 mg daily and told to still proceed with cardioversion. His INR was somewhat overtherapeutic today and I asked him to hold it for one day but did not want him to hold it longer than that for fear that he would be subtherapeutic prior to cardioversion. Cardioversion is planned for Tuesday. ____________________________ TODAYS ORDERS  1. Comprehensive Metabolic Panel: Today  2. Complete Blood Count: Today  3. TSH: Today  4. Electrical Cardioversion Tuesday: 5 days  5. 12 Lead EKG: Today                       ____________________________ Cardiology Physician:  W. Spencer Tamirra Sienkiewicz, Jr. MD [facility]     

## 2012-12-06 NOTE — CV Procedure (Signed)
Electrical Cardioversion Procedure Note  Cody Faulkner   67 y.o. male MRN: [id number] DOB: 1946/05/03  Today's date: 12/06/2012  Procedure: Electrical Cardioversion  Indications:  Atrial Fibrillation  Time Out: Verified patient identification, verified procedure,medications/allergies/relevent history reviewed, required imaging and test results available.  Performed  Procedure Details  The patient was NPO after midnight. Anesthesia was administered at the beside  by Dr. Gypsy Balsam  with 70 mg of propofol.  Cardioversion was done with synchronized biphasic defibrillation with AP pads with 150 watts.  The patient converted to normal sinus rhythm and confirmed by pacer rep. The patient tolerated the procedure well   IMPRESSION:  Successful cardioversion of atrial fibrillation    W. Viann Fish, Montez Hageman. MD [facility]   12/06/2012, 1:12 PM

## 2012-12-06 NOTE — Preoperative (Signed)
Beta Blockers   Reason not to administer Beta Blockers:Not Applicable 

## 2012-12-06 NOTE — Anesthesia Preprocedure Evaluation (Signed)
Anesthesia Evaluation  Patient identified by MRN, date of birth, ID band Patient awake    Reviewed: Allergy & Precautions, H&P , NPO status , Patient's Chart, lab work & pertinent test results  Airway Mallampati: II TM Distance: <3 FB Neck ROM: Limited    Dental   Pulmonary shortness of breath, asthma , sleep apnea ,  breath sounds clear to auscultation        Cardiovascular hypertension, + CAD + dysrhythmias Atrial Fibrillation + Cardiac Defibrillator Rhythm:Irregular Rate:Normal     Neuro/Psych Depression    GI/Hepatic   Endo/Other  Hypothyroidism Morbid obesity  Renal/GU Renal InsufficiencyRenal disease     Musculoskeletal   Abdominal (+) + obese,   Peds  Hematology  (+) Blood dyscrasia, ,   Anesthesia Other Findings   Reproductive/Obstetrics                           Anesthesia Physical Anesthesia Plan  ASA: III  Anesthesia Plan: General   Post-op Pain Management:    Induction: Intravenous  Airway Management Planned: Mask  Additional Equipment:   Intra-op Plan:   Post-operative Plan:   Informed Consent: I have reviewed the patients History and Physical, chart, labs and discussed the procedure including the risks, benefits and alternatives for the proposed anesthesia with the patient or authorized representative who has indicated his/her understanding and acceptance.     Plan Discussed with: CRNA and Surgeon  Anesthesia Plan Comments:         Anesthesia Quick Evaluation

## 2012-12-07 ENCOUNTER — Encounter (HOSPITAL_COMMUNITY): Payer: Self-pay | Admitting: Cardiology

## 2012-12-07 ENCOUNTER — Inpatient Hospital Stay (HOSPITAL_COMMUNITY)
Admission: EM | Admit: 2012-12-07 | Discharge: 2012-12-14 | DRG: 189 | Disposition: A | Discharge status: Skilled Nursing Facility | Payer: Medicare Other | Attending: Pulmonary Disease | Admitting: Pulmonary Disease

## 2012-12-07 ENCOUNTER — Emergency Department (HOSPITAL_COMMUNITY): Payer: Medicare Other

## 2012-12-07 DIAGNOSIS — J4530 Mild persistent asthma, uncomplicated: Secondary | ICD-10-CM

## 2012-12-07 DIAGNOSIS — E039 Hypothyroidism, unspecified: Secondary | ICD-10-CM | POA: Diagnosis present

## 2012-12-07 DIAGNOSIS — J9610 Chronic respiratory failure, unspecified whether with hypoxia or hypercapnia: Secondary | ICD-10-CM

## 2012-12-07 DIAGNOSIS — Z95810 Presence of automatic (implantable) cardiac defibrillator: Secondary | ICD-10-CM

## 2012-12-07 DIAGNOSIS — I119 Hypertensive heart disease without heart failure: Secondary | ICD-10-CM

## 2012-12-07 DIAGNOSIS — I4891 Unspecified atrial fibrillation: Secondary | ICD-10-CM | POA: Diagnosis present

## 2012-12-07 DIAGNOSIS — Q600 Renal agenesis, unilateral: Secondary | ICD-10-CM

## 2012-12-07 DIAGNOSIS — I2510 Atherosclerotic heart disease of native coronary artery without angina pectoris: Secondary | ICD-10-CM | POA: Diagnosis present

## 2012-12-07 DIAGNOSIS — I5022 Chronic systolic (congestive) heart failure: Secondary | ICD-10-CM | POA: Diagnosis present

## 2012-12-07 DIAGNOSIS — I5032 Chronic diastolic (congestive) heart failure: Secondary | ICD-10-CM

## 2012-12-07 DIAGNOSIS — J9692 Respiratory failure, unspecified with hypercapnia: Secondary | ICD-10-CM

## 2012-12-07 DIAGNOSIS — R5381 Other malaise: Secondary | ICD-10-CM | POA: Diagnosis present

## 2012-12-07 DIAGNOSIS — J9602 Acute respiratory failure with hypercapnia: Secondary | ICD-10-CM | POA: Diagnosis present

## 2012-12-07 DIAGNOSIS — I420 Dilated cardiomyopathy: Secondary | ICD-10-CM | POA: Diagnosis present

## 2012-12-07 DIAGNOSIS — Z8639 Personal history of other endocrine, nutritional and metabolic disease: Secondary | ICD-10-CM

## 2012-12-07 DIAGNOSIS — Z602 Problems related to living alone: Secondary | ICD-10-CM

## 2012-12-07 DIAGNOSIS — F329 Major depressive disorder, single episode, unspecified: Secondary | ICD-10-CM | POA: Diagnosis present

## 2012-12-07 DIAGNOSIS — I10 Essential (primary) hypertension: Secondary | ICD-10-CM | POA: Diagnosis present

## 2012-12-07 DIAGNOSIS — E662 Morbid (severe) obesity with alveolar hypoventilation: Secondary | ICD-10-CM | POA: Diagnosis present

## 2012-12-07 DIAGNOSIS — H9190 Unspecified hearing loss, unspecified ear: Secondary | ICD-10-CM | POA: Diagnosis present

## 2012-12-07 DIAGNOSIS — J9600 Acute respiratory failure, unspecified whether with hypoxia or hypercapnia: Secondary | ICD-10-CM

## 2012-12-07 DIAGNOSIS — Z905 Acquired absence of kidney: Secondary | ICD-10-CM

## 2012-12-07 DIAGNOSIS — M109 Gout, unspecified: Secondary | ICD-10-CM | POA: Diagnosis present

## 2012-12-07 DIAGNOSIS — D689 Coagulation defect, unspecified: Secondary | ICD-10-CM | POA: Diagnosis present

## 2012-12-07 DIAGNOSIS — Z6841 Body Mass Index (BMI) 40.0 and over, adult: Secondary | ICD-10-CM

## 2012-12-07 DIAGNOSIS — Z833 Family history of diabetes mellitus: Secondary | ICD-10-CM

## 2012-12-07 DIAGNOSIS — J9620 Acute and chronic respiratory failure, unspecified whether with hypoxia or hypercapnia: Principal | ICD-10-CM | POA: Diagnosis present

## 2012-12-07 DIAGNOSIS — N179 Acute kidney failure, unspecified: Secondary | ICD-10-CM | POA: Diagnosis present

## 2012-12-07 DIAGNOSIS — R918 Other nonspecific abnormal finding of lung field: Secondary | ICD-10-CM

## 2012-12-07 DIAGNOSIS — H269 Unspecified cataract: Secondary | ICD-10-CM | POA: Diagnosis present

## 2012-12-07 DIAGNOSIS — F3289 Other specified depressive episodes: Secondary | ICD-10-CM | POA: Diagnosis present

## 2012-12-07 DIAGNOSIS — G4733 Obstructive sleep apnea (adult) (pediatric): Secondary | ICD-10-CM | POA: Diagnosis present

## 2012-12-07 DIAGNOSIS — N183 Chronic kidney disease, stage 3 (moderate): Secondary | ICD-10-CM

## 2012-12-07 DIAGNOSIS — I428 Other cardiomyopathies: Secondary | ICD-10-CM | POA: Diagnosis present

## 2012-12-07 DIAGNOSIS — I480 Paroxysmal atrial fibrillation: Secondary | ICD-10-CM | POA: Diagnosis present

## 2012-12-07 DIAGNOSIS — G4731 Primary central sleep apnea: Secondary | ICD-10-CM

## 2012-12-07 DIAGNOSIS — Z7901 Long term (current) use of anticoagulants: Secondary | ICD-10-CM

## 2012-12-07 DIAGNOSIS — J449 Chronic obstructive pulmonary disease, unspecified: Secondary | ICD-10-CM

## 2012-12-07 DIAGNOSIS — I509 Heart failure, unspecified: Secondary | ICD-10-CM | POA: Diagnosis present

## 2012-12-07 DIAGNOSIS — Z8249 Family history of ischemic heart disease and other diseases of the circulatory system: Secondary | ICD-10-CM

## 2012-12-07 HISTORY — DX: Personal history of other endocrine, nutritional and metabolic disease: Z86.39

## 2012-12-07 HISTORY — DX: Unilateral primary osteoarthritis, left knee: M17.12

## 2012-12-07 LAB — POCT I-STAT 3, ART BLOOD GAS (G3+)
pCO2 arterial: 62 mmHg (ref 35.0–45.0)
pH, Arterial: 7.287 — ABNORMAL LOW (ref 7.350–7.450)

## 2012-12-07 LAB — CBC WITH DIFFERENTIAL/PLATELET
Basophils Absolute: 0 10*3/uL (ref 0.0–0.1)
Basophils Relative: 0 % (ref 0–1)
Hemoglobin: 12.7 g/dL — ABNORMAL LOW (ref 13.0–17.0)
MCHC: 32.9 g/dL (ref 30.0–36.0)
Neutro Abs: 7.6 10*3/uL (ref 1.7–7.7)
Neutrophils Relative %: 83 % — ABNORMAL HIGH (ref 43–77)
RDW: 15.3 % (ref 11.5–15.5)
WBC: 9.1 10*3/uL (ref 4.0–10.5)

## 2012-12-07 LAB — MRSA PCR SCREENING: MRSA by PCR: NEGATIVE

## 2012-12-07 LAB — POCT I-STAT, CHEM 8
Chloride: 97 mEq/L (ref 96–112)
Glucose, Bld: 109 mg/dL — ABNORMAL HIGH (ref 70–99)
HCT: 41 % (ref 39.0–52.0)
Hemoglobin: 13.9 g/dL (ref 13.0–17.0)
Potassium: 4 mEq/L (ref 3.5–5.1)

## 2012-12-07 LAB — POCT I-STAT TROPONIN I: Troponin i, poc: 0.01 ng/mL (ref 0.00–0.08)

## 2012-12-07 LAB — PROTIME-INR: Prothrombin Time: 33.7 seconds — ABNORMAL HIGH (ref 11.6–15.2)

## 2012-12-07 MED ORDER — POTASSIUM CHLORIDE CRYS ER 20 MEQ PO TBCR
20.0000 meq | EXTENDED_RELEASE_TABLET | ORAL | Status: DC
  Administered 2012-12-08 – 2012-12-14 (×4): 20 meq via ORAL
  Filled 2012-12-07 (×4): qty 1

## 2012-12-07 MED ORDER — ALLOPURINOL 100 MG PO TABS: 100.0000 mg | ORAL_TABLET | Freq: Every day | ORAL | Status: DC

## 2012-12-07 MED ORDER — FUROSEMIDE 40 MG PO TABS
40.0000 mg | ORAL_TABLET | Freq: Every day | ORAL | Status: DC
  Administered 2012-12-08: 40 mg via ORAL
  Filled 2012-12-07: qty 1

## 2012-12-07 MED ORDER — BUDESONIDE 0.25 MG/2ML IN SUSP
0.2500 mg | Freq: Four times a day (QID) | RESPIRATORY_TRACT | Status: DC
  Administered 2012-12-07 – 2012-12-12 (×20): 0.25 mg via RESPIRATORY_TRACT
  Filled 2012-12-07 (×24): qty 2

## 2012-12-07 MED ORDER — ISOSORBIDE MONONITRATE ER 60 MG PO TB24
60.0000 mg | ORAL_TABLET | Freq: Every day | ORAL | Status: DC
  Administered 2012-12-07 – 2012-12-14 (×8): 60 mg via ORAL
  Filled 2012-12-07 (×8): qty 1

## 2012-12-07 MED ORDER — WARFARIN SODIUM 5 MG PO TABS: 5.0000 mg | ORAL_TABLET | Freq: Every day | ORAL | Status: DC

## 2012-12-07 MED ORDER — POTASSIUM CHLORIDE CRYS ER 20 MEQ PO TBCR: 20.0000 meq | EXTENDED_RELEASE_TABLET | ORAL | Status: DC

## 2012-12-07 MED ORDER — ASPIRIN 81 MG PO CHEW
81.0000 mg | CHEWABLE_TABLET | Freq: Every day | ORAL | Status: DC
  Administered 2012-12-08 – 2012-12-14 (×7): 81 mg via ORAL
  Filled 2012-12-07 (×7): qty 1

## 2012-12-07 MED ORDER — IPRATROPIUM BROMIDE 0.02 % IN SOLN
0.5000 mg | Freq: Four times a day (QID) | RESPIRATORY_TRACT | Status: DC
  Administered 2012-12-07 – 2012-12-10 (×12): 0.5 mg via RESPIRATORY_TRACT
  Filled 2012-12-07 (×12): qty 2.5

## 2012-12-07 MED ORDER — ALBUTEROL SULFATE (5 MG/ML) 0.5% IN NEBU
2.5000 mg | INHALATION_SOLUTION | Freq: Four times a day (QID) | RESPIRATORY_TRACT | Status: DC
  Administered 2012-12-07 – 2012-12-12 (×20): 2.5 mg via RESPIRATORY_TRACT
  Filled 2012-12-07 (×20): qty 0.5

## 2012-12-07 MED ORDER — SODIUM CHLORIDE 0.9 % IV SOLN: INTRAVENOUS | Status: DC

## 2012-12-07 MED ORDER — LORATADINE 10 MG PO TABS
10.0000 mg | ORAL_TABLET | Freq: Every morning | ORAL | Status: DC
  Administered 2012-12-07 – 2012-12-12 (×6): 10 mg via ORAL
  Filled 2012-12-07 (×6): qty 1

## 2012-12-07 MED ORDER — ALBUTEROL SULFATE (5 MG/ML) 0.5% IN NEBU: 2.5000 mg | INHALATION_SOLUTION | RESPIRATORY_TRACT | Status: DC | PRN

## 2012-12-07 MED ORDER — HEPARIN SODIUM (PORCINE) 5000 UNIT/ML IJ SOLN: 5000.0000 [IU] | Freq: Three times a day (TID) | INTRAMUSCULAR | Status: DC

## 2012-12-07 MED ORDER — ASPIRIN 300 MG RE SUPP: 300.0000 mg | RECTAL | Status: DC

## 2012-12-07 MED ORDER — WARFARIN - PHARMACIST DOSING INPATIENT
Freq: Every day | Status: DC
  Administered 2012-12-10 – 2012-12-13 (×4)

## 2012-12-07 MED ORDER — ASPIRIN 81 MG PO CHEW: 324.0000 mg | CHEWABLE_TABLET | ORAL | Status: DC

## 2012-12-07 MED ORDER — BISOPROLOL FUMARATE 10 MG PO TABS
10.0000 mg | ORAL_TABLET | Freq: Every day | ORAL | Status: DC
  Administered 2012-12-07 – 2012-12-14 (×8): 10 mg via ORAL
  Filled 2012-12-07 (×8): qty 1

## 2012-12-07 MED ORDER — HYDRALAZINE HCL 25 MG PO TABS
25.0000 mg | ORAL_TABLET | Freq: Two times a day (BID) | ORAL | Status: DC
  Administered 2012-12-07 – 2012-12-14 (×13): 25 mg via ORAL
  Filled 2012-12-07 (×15): qty 1

## 2012-12-07 MED ORDER — AMIODARONE HCL 200 MG PO TABS
200.0000 mg | ORAL_TABLET | Freq: Two times a day (BID) | ORAL | Status: DC
  Administered 2012-12-07 – 2012-12-14 (×14): 200 mg via ORAL
  Filled 2012-12-07 (×15): qty 1

## 2012-12-07 MED ORDER — LEVOTHYROXINE SODIUM 100 MCG PO TABS
100.0000 ug | ORAL_TABLET | Freq: Every day | ORAL | Status: DC
  Administered 2012-12-07 – 2012-12-14 (×8): 100 ug via ORAL
  Filled 2012-12-07 (×9): qty 1

## 2012-12-07 MED ORDER — FUROSEMIDE 10 MG/ML IJ SOLN
40.0000 mg | Freq: Once | INTRAMUSCULAR | Status: AC
  Administered 2012-12-07: 40 mg via INTRAVENOUS
  Filled 2012-12-07: qty 4

## 2012-12-07 MED ORDER — WARFARIN SODIUM 2 MG PO TABS
2.0000 mg | ORAL_TABLET | Freq: Once | ORAL | Status: AC
  Administered 2012-12-07: 2 mg via ORAL
  Filled 2012-12-07: qty 1

## 2012-12-07 MED ORDER — SODIUM CHLORIDE 0.9 % IV SOLN: 250.0000 mL | INTRAVENOUS | Status: DC | PRN

## 2012-12-07 MED ORDER — BUDESONIDE 0.25 MG/2ML IN SUSP
0.2500 mg | Freq: Four times a day (QID) | RESPIRATORY_TRACT | Status: DC
  Filled 2012-12-07 (×3): qty 2

## 2012-12-07 NOTE — ED Notes (Signed)
RT called to place pt on venturi mask.

## 2012-12-07 NOTE — ED Notes (Signed)
RT called for BIPAP

## 2012-12-07 NOTE — ED Notes (Signed)
Called to give report. RN to call back. 

## 2012-12-07 NOTE — H&P (Signed)
PULMONARY  / CRITICAL CARE MEDICINE  Name: Cody Faulkner MRN: [id number] DOB: [birth year]    ADMISSION DATE:  12/07/2012  REFERRING MD :  Dr. Rubin Payor PRIMARY SERVICE:  PCCM  CHIEF COMPLAINT:  Acute Respiratory Failure  BRIEF PATIENT DESCRIPTION: 67 y/o M with OSA / OHS (untreated) who presented to [facility] ER with increased SOB, sleepiness and dull chest pain.  Found to have hypercarbic respiratory failure and PCCM called for ICU admit. Of note, patient underwent cardioversion on 7/1 per Dr. Donnie Aho.  Supratherapeutic INR on presentation.   SIGNIFICANT EVENTS / STUDIES:  7/01 - cardioversion per Dr. Donnie Aho.  Received 70 mg propofol for procedure with successful cardioversion.   ....................................................................................................................................................................... 7/02 - admitted with decompensated OHS   CULTURES:   ANTIBIOTICS:   HISTORY OF PRESENT ILLNESS:  67 y/o M, never smoker,  with PMH of Afib on coumadin, HTN, Depression, CAD, Hypothyroidism, CHF, and OSA / OHS (untreated) who presented to [facility] ER with increased SOB, sleepiness and dull chest pain.  EMS noted patient to be hypoxic with sats in low 70's.  Oxygen applied and sats improved.  Patient noted to have increased LE edema (2-3+ pitting).  ABG review 7.28 / pCO2 of 62, O2 78, HCO3 29.  ABG review notes that he usually is not a CO2 retainer.  Found to have hypercarbic respiratory failure.  Placed on BiPap in ER with minimal improvement in symptoms.  Of note, patient underwent cardioversion on 7/1 per Dr. Donnie Aho.  Supratherapeutic INR on presentation.    PAST MEDICAL HISTORY :  Past Medical History  Diagnosis Date  . Atrial fibrillation     Cardioversion 2007  . Hypertension   . OSA (obstructive sleep apnea)        . Depression   . Gouty arthritis   . CAD (coronary artery disease)     predominantly single vessel  . Ventricular tachycardia    s/p defibrillator-Medtronic EnTrust [id number]  . Dilated cardiomyopathy     s/p defibrillator Medtronic EnTrust [phone]  . Asthma   . 1191 Lead   . Implantable Defibrillator     Medtronic Entrust  . Chronic systolic heart failure   . Hypothyroidism   . Acute renal failure 06/06/2011  . Solitary kidney 06/05/2006  . Gout   . CHF (congestive heart failure)   . Pneumonia, community acquired     bilateral bibasilar   . Shortness of breath on exertion    Past Surgical History  Procedure Laterality Date  . Cardiac defibrillator placement  2007    Medtronic EnTrust [phone]  . Nephrectomy  1976    Right  . Cardiac catheterization  2007    60% Stenosis mid-CFX  . Knee arthroscopy      left  . Cardioversion  06/18/2011    Procedure: CARDIOVERSION;  Surgeon: Darden Palmer., MD;  Location: [facility] OR;  Service: Cardiovascular;  Laterality: N/A;  . Tee without cardioversion  07/13/2012    Procedure: TRANSESOPHAGEAL ECHOCARDIOGRAM (TEE);  Surgeon: Lewayne Bunting, MD;  Location: [facility] ENDOSCOPY;  Service: Cardiovascular;  Laterality: N/A;  . Cardioversion N/A 12/06/2012    Procedure: CARDIOVERSION;  Surgeon: Othella Boyer, MD;  Location: [facility] ENDOSCOPY;  Service: Cardiovascular;  Laterality: N/A;   Prior to Admission medications   Medication Sig Start Date End Date Taking? Authorizing Provider  amiodarone (PACERONE) 200 MG tablet Take 200 mg by mouth 2 (two) times daily.   Yes Historical Provider, MD  aspirin 81 MG chewable tablet Chew 81 mg  by mouth daily.   Yes Historical Provider, MD  cetaphil (CETAPHIL) cream Apply 1 application topically as needed.    Yes Historical Provider, MD  doxycycline (ADOXA) 100 MG tablet Take 100 mg by mouth daily.   Yes Historical Provider, MD  acetaminophen (TYLENOL) 500 MG tablet Take 1,000 mg by mouth every 6 (six) hours as needed.    Historical Provider, MD  albuterol (PROVENTIL HFA;VENTOLIN HFA) 108 (90 BASE) MCG/ACT inhaler Inhale 2 puffs into the lungs every 4 (four)  hours as needed. For shortness of breath     Historical Provider, MD  allopurinol (ZYLOPRIM) 100 MG tablet Take 100 mg by mouth daily.    Historical Provider, MD  bisoprolol (ZEBETA) 10 MG tablet Take 10 mg by mouth daily.    Historical Provider, MD  budesonide-formoterol (SYMBICORT) 160-4.5 MCG/ACT inhaler Inhale 2 puffs into the lungs 2 (two) times daily.    Historical Provider, MD  fenofibrate micronized (LOFIBRA) 134 MG capsule Take 134 mg by mouth daily before breakfast.  05/21/12   Historical Provider, MD  furosemide (LASIX) 40 MG tablet Take 40 mg by mouth daily.     Historical Provider, MD  hydrALAZINE (APRESOLINE) 25 MG tablet Take 25 mg by mouth 2 (two) times daily.     Historical Provider, MD  isosorbide mononitrate (IMDUR) 60 MG 24 hr tablet Take 60 mg by mouth daily.    Historical Provider, MD  levothyroxine (SYNTHROID, LEVOTHROID) 100 MCG tablet Take 100 mcg by mouth daily.    Historical Provider, MD  loratadine (CLARITIN) 10 MG tablet Take 10 mg by mouth every morning.     Historical Provider, MD  potassium chloride SA (K-DUR,KLOR-CON) 20 MEQ tablet Take 20 mEq by mouth every other day.    Historical Provider, MD  warfarin (COUMADIN) 5 MG tablet Take 5-7.5 mg by mouth daily. Sunday, Tuesday, Thursday, Saturday 5mg  All other days 7.5mg     Historical Provider, MD   Allergies  Allergen Reactions  . Methimazole (Thiamazole) Itching and Rash    Severe rash    FAMILY HISTORY:  Family History  Problem Relation Age of Onset  . Heart disease Other     uncle  . Diabetes Other     uncle   SOCIAL HISTORY:  reports that he has never smoked. He has never used smokeless tobacco. He reports that  drinks alcohol. He reports that he does not use illicit drugs.  REVIEW OF SYSTEMS:   Constitutional: Negative for fever, chills, weight loss, malaise/fatigue and diaphoresis.  HENT: Negative for hearing loss, ear pain, nosebleeds, congestion, sore throat, neck pain, tinnitus and ear  discharge.   Eyes: Negative for blurred vision, double vision, photophobia, pain, discharge and redness.  Respiratory: Negative for cough, hemoptysis, sputum production, wheezing and stridor.  Reports shortness of breath Cardiovascular: Negative for palpitations, orthopnea, claudication, and PND. Dull chest pain and essentially chronic leg swelling Gastrointestinal: Negative for heartburn, nausea, vomiting, abdominal pain, diarrhea, constipation, blood in stool and melena.  Genitourinary: Negative for dysuria, urgency, frequency, hematuria and flank pain.  Musculoskeletal: Negative for myalgias, back pain, joint pain and falls.  Skin: Negative for itching and rash.  Neurological: Negative for dizziness, tingling, tremors, sensory change, speech change, focal weakness, seizures, loss of consciousness, weakness and headaches.  Endo/Heme/Allergies: Negative for environmental allergies and polydipsia. Does not bruise/bleed easily.  SUBJECTIVE:   VITAL SIGNS: Temp:  [98.1 F (36.7 C)-99.2 F (37.3 C)] 98.1 F (36.7 C) (07/02 1327) Pulse Rate:  [68-72] 70 (07/02 1300) Resp:  [  10-29] 24 (07/02 1300) BP: (114-159)/(73-105) 139/76 mmHg (07/02 1300) SpO2:  [90 %-100 %] 92 % (07/02 1300)  PHYSICAL EXAMINATION: General:  Chronically ill, morbidly obese male Neuro:  Lethargic but arouses, appropriate when alert, moves all ext's HEENT:  Short thick neck, mm pink/moist Cardiovascular:  s1s2 rrr, no m/r/g Lungs:  resp's even/non-labored, good air movement, lungs bilaterally clear, moist cough Abdomen:  Obese, soft Musculoskeletal:  No acute deformities Skin:  Warm/dry, BLE edema 2-3+ pitting with chronic venous changes   Recent Labs Lab 12/07/12 1226  NA 135  K 4.0  CL 97  BUN 25*  CREATININE 1.40*  GLUCOSE 109*    Recent Labs Lab 12/07/12 1213 12/07/12 1226  HGB 12.7* 13.9  HCT 38.6* 41.0  WBC 9.1  --   PLT 191  --    Dg Chest Port 1 View  12/07/2012   *RADIOLOGY REPORT*   Clinical Data: Shortness of breath, weakness  PORTABLE CHEST - 1 VIEW  Comparison: Chest x-ray of 07/14/2012  Findings: Moderate cardiomegaly is again noted with defibrillator leads.  There is perihilar haziness most consistent with mild edema.  A small right effusion cannot be excluded.  IMPRESSION: Cardiomegaly and mild edema.   Original Report Authenticated By: Dwyane Dee, M.D.    ASSESSMENT / PLAN:  Acute on Chronic Respiratory Failure - never smoker OSA / OHS - with acute decompensation   Plan: -oxygen via VM with careful titration to maintain sats 88-93% -repeat cxr in am to ensure no infiltrate -schedule BD's, nebulized ICS -PRN BD's -continue claritin -avoid sedating medications  Atrial Fibrillation - s/p successful cardioversion on 7/1, chronic coumadin HTN CAD s/p AICD Chronic Systolic CHF Chest Pain - supratherapeutic INR on admit  Plan: -ASA -continue bisoprolol, hydralazine, imdur -lasix in ER, continue home dose  -assess enzymes -f/u EKG in am -Courtesy notification of admit called to Dr. Donnie Aho.    Acute Renal Insufficiency / AKI - baseline solitary kidney  Plan: -continue lasix for now -repeat BMP in am  Coagulopathy - chronic coumadin administration  Plan: -continue coumadin per pharmacy -no need to reverse coumadin  Hypothyroidism Gout  Plan: -hold allopurinol  -continue synthroid   Canary Brim, NP-C [facility] Pgr: [phone] or [phone]   12/07/2012, 1:46 PM  I have interviewed and examined the patient and reviewed the database. I have formulated the assessment and plan as reflected in the note above with amendments made by me.   Billy Fischer, MD;  PCCM service; [place] [phone]

## 2012-12-07 NOTE — ED Notes (Signed)
PT is talking with MD and he reports feeling better; Neb tx done.

## 2012-12-07 NOTE — ED Notes (Signed)
PT having difficulty maintaining sats on 4L [place]; drowsy; MD aware.

## 2012-12-07 NOTE — ED Provider Notes (Signed)
History    CSN: [id number] Arrival date & time 12/07/12  1030  First MD Initiated Contact with Patient 12/07/12 1035     Chief Complaint  Patient presents with  . Shortness of Breath   level V caveat due to altered mental status. (Consider location/radiation/quality/duration/timing/severity/associated sxs/prior Treatment) Patient is a 67 y.o. male presenting with shortness of breath. The history is provided by the patient.  Shortness of Breath Severity:  Severe  patient presents with shortness of breath. States he had a cardioversion yesterday and has been doing somewhat worse since. Found to have sats of 73% in by EMS. Improved with oxygen. He has had some sleepiness. He states he does have some dull chest pain. Past Medical History  Diagnosis Date  . Atrial fibrillation     Cardioversion 2007  . Hypertension   . OSA (obstructive sleep apnea)        . Depression   . Gouty arthritis   . CAD (coronary artery disease)     predominantly single vessel  . Ventricular tachycardia     s/p defibrillator-Medtronic EnTrust [id number]  . Dilated cardiomyopathy     s/p defibrillator Medtronic EnTrust [phone]  . Asthma   . 5784 Lead   . Implantable Defibrillator     Medtronic Entrust  . Chronic systolic heart failure   . Hypothyroidism   . Acute renal failure 06/06/2011  . Solitary kidney 06/05/2006  . Gout   . CHF (congestive heart failure)   . Pneumonia, community acquired     bilateral bibasilar   . Shortness of breath on exertion    Past Surgical History  Procedure Laterality Date  . Cardiac defibrillator placement  2007    Medtronic EnTrust [phone]  . Nephrectomy  1976    Right  . Cardiac catheterization  2007    60% Stenosis mid-CFX  . Knee arthroscopy      left  . Cardioversion  06/18/2011    Procedure: CARDIOVERSION;  Surgeon: Darden Palmer., MD;  Location: [facility] OR;  Service: Cardiovascular;  Laterality: N/A;  . Tee without cardioversion  07/13/2012    Procedure:  TRANSESOPHAGEAL ECHOCARDIOGRAM (TEE);  Surgeon: Lewayne Bunting, MD;  Location: [facility] ENDOSCOPY;  Service: Cardiovascular;  Laterality: N/A;  . Cardioversion N/A 12/06/2012    Procedure: CARDIOVERSION;  Surgeon: Othella Boyer, MD;  Location: [facility] ENDOSCOPY;  Service: Cardiovascular;  Laterality: N/A;   Family History  Problem Relation Age of Onset  . Heart disease Other     uncle  . Diabetes Other     uncle   History  Substance Use Topics  . Smoking status: Never Smoker   . Smokeless tobacco: Never Used  . Alcohol Use: Yes     Comment: 06/17/11 "aien't drank nothing for 4 years now"    Review of Systems  Unable to perform ROS: Acuity of condition  Respiratory: Positive for shortness of breath.     Allergies  Methimazole  Home Medications   Current Outpatient Rx  Name  Route  Sig  Dispense  Refill  . amiodarone (PACERONE) 200 MG tablet   Oral   Take 200 mg by mouth 2 (two) times daily.         Marland Kitchen aspirin 81 MG chewable tablet   Oral   Chew 81 mg by mouth daily.         . cetaphil (CETAPHIL) cream   Topical   Apply 1 application topically as needed.          Marland Kitchen  doxycycline (ADOXA) 100 MG tablet   Oral   Take 100 mg by mouth daily.         Marland Kitchen acetaminophen (TYLENOL) 500 MG tablet   Oral   Take 1,000 mg by mouth every 6 (six) hours as needed.         Marland Kitchen albuterol (PROVENTIL HFA;VENTOLIN HFA) 108 (90 BASE) MCG/ACT inhaler   Inhalation   Inhale 2 puffs into the lungs every 4 (four) hours as needed. For shortness of breath          . allopurinol (ZYLOPRIM) 100 MG tablet   Oral   Take 100 mg by mouth daily.         . bisoprolol (ZEBETA) 10 MG tablet   Oral   Take 10 mg by mouth daily.         . budesonide-formoterol (SYMBICORT) 160-4.5 MCG/ACT inhaler   Inhalation   Inhale 2 puffs into the lungs 2 (two) times daily.         . fenofibrate micronized (LOFIBRA) 134 MG capsule   Oral   Take 134 mg by mouth daily before breakfast.          .  furosemide (LASIX) 40 MG tablet   Oral   Take 40 mg by mouth daily.          . hydrALAZINE (APRESOLINE) 25 MG tablet   Oral   Take 25 mg by mouth 2 (two) times daily.          . isosorbide mononitrate (IMDUR) 60 MG 24 hr tablet   Oral   Take 60 mg by mouth daily.         Marland Kitchen levothyroxine (SYNTHROID, LEVOTHROID) 100 MCG tablet   Oral   Take 100 mcg by mouth daily.         Marland Kitchen loratadine (CLARITIN) 10 MG tablet   Oral   Take 10 mg by mouth every morning.          . potassium chloride SA (K-DUR,KLOR-CON) 20 MEQ tablet   Oral   Take 20 mEq by mouth every other day.         . warfarin (COUMADIN) 5 MG tablet   Oral   Take 5-7.5 mg by mouth daily. Sunday, Tuesday, Thursday, Saturday 5mg  All other days 7.5mg           BP 143/80  Pulse 70  Temp(Src) 98.1 F (36.7 C) (Oral)  Resp 27  SpO2 90% Physical Exam  Constitutional: He is oriented to person, place, and time. He appears well-developed.  HENT:  Head: Normocephalic.  Eyes: Pupils are equal, round, and reactive to light.  Neck: Neck supple.  Cardiovascular: Normal rate.   Pulmonary/Chest:  Diffuse wheezes and decreased air movement. Mild distress  Abdominal: Soft. There is no tenderness.  Musculoskeletal: He exhibits edema.  Bilateral lower extremity pitting edema  Neurological: He is alert and oriented to person, place, and time.  Skin: No erythema.    ED Course  Procedures (including critical care time) Labs Reviewed  CBC WITH DIFFERENTIAL - Abnormal; Notable for the following:    Hemoglobin 12.7 (*)    HCT 38.6 (*)    Neutrophils Relative % 83 (*)    Lymphocytes Relative 8 (*)    All other components within normal limits  PRO B NATRIURETIC PEPTIDE - Abnormal; Notable for the following:    Pro B Natriuretic peptide (BNP) 530.9 (*)    All other components within normal limits  PROTIME-INR - Abnormal; Notable for  the following:    Prothrombin Time 33.7 (*)    INR 3.48 (*)    All other components  within normal limits  POCT I-STAT 3, BLOOD GAS (G3+) - Abnormal; Notable for the following:    pH, Arterial 7.287 (*)    pCO2 arterial 62.0 (*)    pO2, Arterial 78.0 (*)    Bicarbonate 29.6 (*)    All other components within normal limits  POCT I-STAT, CHEM 8 - Abnormal; Notable for the following:    BUN 25 (*)    Creatinine, Ser 1.40 (*)    Glucose, Bld 109 (*)    All other components within normal limits  TROPONIN I  TROPONIN I  TROPONIN I  POCT I-STAT TROPONIN I   Dg Chest Port 1 View  12/07/2012   *RADIOLOGY REPORT*  Clinical Data: Shortness of breath, weakness  PORTABLE CHEST - 1 VIEW  Comparison: Chest x-ray of 07/14/2012  Findings: Moderate cardiomegaly is again noted with defibrillator leads.  There is perihilar haziness most consistent with mild edema.  A small right effusion cannot be excluded.  IMPRESSION: Cardiomegaly and mild edema.   Original Report Authenticated By: Dwyane Dee, M.D.   1. COPD (chronic obstructive pulmonary disease)   2. CHF (congestive heart failure), unspecified failure chronicity, unspecified type   3. Respiratory failure with hypercapnia   4. Acute respiratory failure with hypercapnia   5. Chronic respiratory failure   6. Chronic diastolic heart failure   7. Obesity hypoventilation syndrome      Date: 12/07/2012  Rate: 70  Rhythm: normal sinus rhythm  QRS Axis: normal  Intervals: normal  ST/T Wave abnormalities: nonspecific ST/T changes  Conduction Disutrbances:none  Narrative Interpretation:   Old EKG Reviewed: unchanged  CRITICAL CARE Performed by: Billee Cashing Total critical care time: 30 Critical care time was exclusive of separately billable procedures and treating other patients. Critical care was necessary to treat or prevent imminent or life-threatening deterioration. Critical care was time spent personally by me on the following activities: development of treatment plan with patient and/or surrogate as well as nursing,  discussions with consultants, evaluation of patient's response to treatment, examination of patient, obtaining history from patient or surrogate, ordering and performing treatments and interventions, ordering and review of laboratory studies, ordering and review of radiographic studies, pulse oximetry and re-evaluation of patient's condition.  MDM  Patient with shortness of breath. Hypoxia. Likely combination of COPD and CHF. Has acute on chronic CO2 retention. Will admit to critical care. Patient had serial nebulizers. Also a trial of BiPAP    Annalisa Colonna R. Rubin Payor, MD 12/07/12 [phone]

## 2012-12-07 NOTE — ED Notes (Signed)
The floor called 10 minutes ago.  The bed is now ready

## 2012-12-07 NOTE — ED Notes (Signed)
Intensivist at bedside.

## 2012-12-07 NOTE — ED Notes (Signed)
MD at bedside; pt talking with MD; reports he is feeling better.

## 2012-12-07 NOTE — Progress Notes (Signed)
ANTICOAGULATION CONSULT NOTE - Initial Consult  Pharmacy Consult for coumadin, pharmacy may adjust abx for renal fxn Indication: atrial fibrillation  Allergies  Allergen Reactions  . Methimazole (Thiamazole) Itching and Rash    Severe rash    Patient Measurements:    Weight: 123.4 kg  Vital Signs: Temp: 98.1 F (36.7 C) (07/02 1327) Temp src: Oral (07/02 1327) BP: 163/88 mmHg (07/02 1445) Pulse Rate: 73 (07/02 1445)  Labs:  Recent Labs  12/07/12 1213 12/07/12 1226  HGB 12.7* 13.9  HCT 38.6* 41.0  PLT 191  --   LABPROT 33.7*  --   INR 3.48*  --   CREATININE  --  1.40*    The CrCl is unknown because both a height and weight (above a minimum accepted value) are required for this calculation.   Medical History: Past Medical History  Diagnosis Date  . Atrial fibrillation     Cardioversion 2007  . Hypertension   . OSA (obstructive sleep apnea)        . Depression   . Gouty arthritis   . CAD (coronary artery disease)     predominantly single vessel  . Ventricular tachycardia     s/p defibrillator-Medtronic EnTrust D154  . Dilated cardiomyopathy     s/p defibrillator Medtronic EnTrust [phone]  . Asthma   . 9604 Lead   . Implantable Defibrillator     Medtronic Entrust  . Chronic systolic heart failure   . Hypothyroidism   . Acute renal failure 06/06/2011  . Solitary kidney 06/05/2006  . Gout   . CHF (congestive heart failure)   . Pneumonia, community acquired     bilateral bibasilar   . Shortness of breath on exertion     Medications: coumadin 5 mg Sun, Tu, Th, Sat, and 7.5 mg MWF  Assessment: Mr. Erdahl is a 67 yo man to continue his home coumadin for afib. His home dose is 5 mg MWF and 7.5 mg Su, Tu, Th, Saturday.  His INR today is slightly high at 3.48.   His H/H is 13.9/41 and his PLTC is 191 k.  His creat is 1.4.  He is s/p cardioversion yesterday and now c/o SOB and dull chest pain.  He was found to have hypercarbic respiratory failure and will be  admitted to the ICU.     Goal of Therapy:  INR 2-3   Plan:  1. DC sq heparin 2nd INR 3.48 2. Coumadin 2 mg po x 1 dose today 3. Daily INR 4. No abx ordered, so no dosage adjustment needed Herby Abraham, Pharm.D. [phone] 12/07/2012 3:45 PM

## 2012-12-07 NOTE — ED Notes (Signed)
RT placed BIPAP; urinal at bedside.

## 2012-12-07 NOTE — ED Notes (Signed)
Phlebotomist at bedside.

## 2012-12-07 NOTE — ED Notes (Signed)
X-ray at bedside

## 2012-12-07 NOTE — ED Notes (Signed)
PT is not on any oxygen at home.

## 2012-12-07 NOTE — ED Notes (Signed)
Venturi mask in place.

## 2012-12-07 NOTE — ED Notes (Signed)
EKG Completed 

## 2012-12-07 NOTE — ED Notes (Signed)
Per EMS, when arrived pt wasn't moving much air and diminished. Pt RA sats 73% with mid sternal chest pain. sts this has been going on for 2 weeks. sts was at cardiologist yesterday and heart was out of rhythm and was cardioverted. Pt internal defib. 3 albuterol 1 Atrovent PTA. Sat 94% on neb. Lungs diminished.

## 2012-12-07 NOTE — ED Notes (Signed)
[phone]- Family contact Minerva Areola (son)

## 2012-12-08 ENCOUNTER — Inpatient Hospital Stay (HOSPITAL_COMMUNITY): Payer: Medicare Other

## 2012-12-08 DIAGNOSIS — I4891 Unspecified atrial fibrillation: Secondary | ICD-10-CM

## 2012-12-08 DIAGNOSIS — J449 Chronic obstructive pulmonary disease, unspecified: Secondary | ICD-10-CM

## 2012-12-08 LAB — CBC
HCT: 38.4 % — ABNORMAL LOW (ref 39.0–52.0)
MCV: 88.7 fL (ref 78.0–100.0)
Platelets: 197 10*3/uL (ref 150–400)
RBC: 4.33 MIL/uL (ref 4.22–5.81)
WBC: 9.1 10*3/uL (ref 4.0–10.5)

## 2012-12-08 LAB — PROTIME-INR
INR: 4.17 — ABNORMAL HIGH (ref 0.00–1.49)
Prothrombin Time: 38.7 s — ABNORMAL HIGH (ref 11.6–15.2)

## 2012-12-08 LAB — BASIC METABOLIC PANEL
CO2: 29 mEq/L (ref 19–32)
Chloride: 95 mEq/L — ABNORMAL LOW (ref 96–112)
Potassium: 4.2 mEq/L (ref 3.5–5.1)
Sodium: 134 mEq/L — ABNORMAL LOW (ref 135–145)

## 2012-12-08 LAB — TROPONIN I: Troponin I: <0.3 ng/mL

## 2012-12-08 LAB — PRO B NATRIURETIC PEPTIDE: Pro B Natriuretic peptide (BNP): 619.8 pg/mL — ABNORMAL HIGH (ref 0–125)

## 2012-12-08 MED ORDER — FUROSEMIDE 10 MG/ML IJ SOLN
40.0000 mg | Freq: Four times a day (QID) | INTRAMUSCULAR | Status: AC
  Administered 2012-12-08 – 2012-12-09 (×3): 40 mg via INTRAVENOUS
  Filled 2012-12-08 (×3): qty 4

## 2012-12-08 NOTE — Progress Notes (Signed)
PULMONARY  / CRITICAL CARE MEDICINE  Name: Cody Faulkner MRN: [id number] DOB: 1945/10/01    ADMISSION DATE:  12/07/2012  REFERRING MD :  Dr. Rubin Payor PRIMARY SERVICE:  PCCM  CHIEF COMPLAINT:  Acute Respiratory Failure  BRIEF PATIENT DESCRIPTION: 67 y/o M with OSA / OHS (untreated) who presented to [facility] ER with increased SOB, sleepiness and dull chest pain.  Found to have hypercarbic respiratory failure and PCCM called for ICU admit. Of note, patient underwent cardioversion on 7/1 per Dr. Donnie Aho.  Supratherapeutic INR on presentation.   SIGNIFICANT EVENTS / STUDIES:  7/01 - cardioversion per Dr. Donnie Aho.  Received 70 mg propofol for procedure with successful cardioversion.   ....................................................................................................................................................................... 7/02 - admitted with decompensated OHS   CULTURES:   ANTIBIOTICS:   SUBJECTIVE: Respiratory failure overnight requiring BiPAP  VITAL SIGNS: Temp:  [98.1 F (36.7 C)-99 F (37.2 C)] 98.4 F (36.9 C) (07/03 1138) Pulse Rate:  [69-73] 70 (07/03 1225) Resp:  [15-32] 32 (07/03 1225) BP: (95-163)/(54-88) 101/68 mmHg (07/03 1225) SpO2:  [86 %-99 %] 96 % (07/03 1225) FiO2 (%):  [30 %-50 %] 40 % (07/03 1138) Weight:  [119.9 kg (264 lb 5.3 oz)-120.3 kg (265 lb 3.4 oz)] 119.9 kg (264 lb 5.3 oz) (07/03 0358)  PHYSICAL EXAMINATION: General:  Chronically ill, morbidly obese male on BiPAP Neuro:  Lethargic but arouses, appropriate when alert, moves all ext's HEENT:  Short thick neck, mm pink/moist Cardiovascular:  s1s2 rrr, no m/r/g Lungs:  resp's even/non-labored, good air movement, lungs bilaterally clear, moist cough Abdomen:  Obese, soft Musculoskeletal:  No acute deformities Skin:  Warm/dry, BLE edema 2-3+ pitting with chronic venous changes   Recent Labs Lab 12/07/12 1226 12/08/12 0419  NA 135 134*  K 4.0 4.2  CL 97 95*  CO2  --  29   BUN 25* 23  CREATININE 1.40* 1.31  GLUCOSE 109* 110*    Recent Labs Lab 12/07/12 1213 12/07/12 1226 12/08/12 0419  HGB 12.7* 13.9 12.6*  HCT 38.6* 41.0 38.4*  WBC 9.1  --  9.1  PLT 191  --  197   Dg Chest Port 1 View  12/08/2012   *RADIOLOGY REPORT*  Clinical Data: Shortness of breath.  Evaluate for airspace disease.  PORTABLE CHEST - 1 VIEW  Comparison: 12/07/2012.  Findings: Stable moderate cardiac silhouette enlargement is evident.  Multiple lead AICD is in place with controller device on the left. Ectasia and nonaneurysmal calcification of the thoracic aorta are seen.  Mediastinal and hilar contours appear stable. There is vascular congestion pattern.  Haziness and patchy pulmonary infiltrative densities are seen in the perihilar region and in both lung bases.  This could reflect edema and / or pneumonia.  Indistinctness of costophrenic angles may reflect atelectasis and pleural effusions.  Osteophytes are present in the spine.  IMPRESSION: Stable cardiac silhouette enlargement.  AICD in place. There is vascular congestion pattern.  Haziness and patchy pulmonary infiltrative densities are seen in the perihilar region and in both lung bases.  This could reflect edema and / or pneumonia. Indistinctness of costophrenic angles may reflect atelectasis and pleural effusions.   Original Report Authenticated By: Onalee Hua Call   Dg Chest Port 1 View  12/07/2012   *RADIOLOGY REPORT*  Clinical Data: Shortness of breath, weakness  PORTABLE CHEST - 1 VIEW  Comparison: Chest x-ray of 07/14/2012  Findings: Moderate cardiomegaly is again noted with defibrillator leads.  There is perihilar haziness most consistent with mild edema.  A small right effusion cannot  be excluded.  IMPRESSION: Cardiomegaly and mild edema.   Original Report Authenticated By: Dwyane Dee, M.D.   ASSESSMENT / PLAN:  Acute on Chronic Respiratory Failure - never smoker. OSA / OHS - with acute decompensation with pulmonary edema on  CXR.  Plan: - Continue BiPAP until able to diurese. - Repeat cxr in am to evaluate resolution of pulmonary edema. - Schedule BD's, nebulized ICS. - PRN BD's. - Continue claritin. - Avoid sedating medications. - Diureses as ordered below.  Atrial Fibrillation - s/p successful cardioversion on 7/1, chronic coumadin HTN CAD s/p AICD Chronic Systolic CHF Chest Pain - supratherapeutic INR on admit  Plan: - ASA - Continue bisoprolol, hydralazine, imdur. - Lasix 40 q6 x3 doses. - Enzymes negative. - F/U EKG in am. - Dr. Donnie Aho to see patient on consultation. - D/C home dose of lasix.  Acute Renal Insufficiency / AKI - baseline solitary kidney  Plan: - Continue lasix for now - Repeat BMP in am  Coagulopathy - chronic coumadin administration  Plan: - Continue coumadin per pharmacy. - No need to reverse coumadin.  Hypothyroidism Gout  Plan: - Hold allopurinol  - Continue synthroid  CC time 35 min.  Alyson Reedy, M.D. [facility] Pulmonary/Critical Care Medicine. Pager: [phone]. After hours pager: [phone].

## 2012-12-08 NOTE — Progress Notes (Signed)
Pt continues to be lethargic. Pt does not respond to voice or touch. Pt stimulates to sternal rub. Pt states "what" and swats at nurses hand. Request pt to follow commands to reassess pt. Pt does not respond or follow commands. Sternal rub pt, responds. Request pt to squeeze this nurse hands, pt states "NO". Will report to oncoming nurse.

## 2012-12-08 NOTE — Progress Notes (Signed)
Pt switched to Servo I due to transport. Pt transported to CT and back on BiPap. No complications.

## 2012-12-08 NOTE — Progress Notes (Signed)
  Echocardiogram 2D Echocardiogram has been performed.  Cody Faulkner FRANCES 12/08/2012, 4:39 PM

## 2012-12-08 NOTE — Progress Notes (Signed)
Utilization Review Completed. 12/08/2012  

## 2012-12-08 NOTE — Consult Note (Signed)
Cardiology Consult Note  Admit date: 12/07/2012 Name: Cody Faulkner 67 y.o.  male DOB:  [birth year] MRN:  [id number]  Today's date:  12/08/2012  Referring Physician:    Pulmonary medicine  Primary Physician:   Dr. Rob Bunting  Reason for Consultation:   Shortness of breath, recent cardioversion  IMPRESSIONS: 1. Decompensated respiratory failure likely due to obstructive sleep apnea and obesity hypoventilation syndrome with acute hypercarbic respiratory failure 2. Recent atrial fibrillation currently maintaining sinus rhythm following cardioversion  3. Morbid obesity 4. Long-term anticoagulation with warfarin with super therapeutic INR 5. History of cardiomyopathy in the presence of atrial fibrillation in the past 6. History of hyperthyroidism either iatrogenic or due to amiodarone previously 7. Chronic kidney disease stage III 8. History of implantable defibrillator for ventricular tachycardia  RECOMMENDATION: 1. Reasonable to diurese and watch renal function at this time. The predominant presentation appears to be primarily respiratory however. It may be residual effects from the propofol given for anesthesia and the presence also of previous sleep apnea. He has not been willing to wear CPAP in the past. 2. I would check a head CT to be sure he does not have an intracranial problem that could explain the hypercarbia  HISTORY: This 67 year-old male has a history of cardiomyopathy and atrial fibrillation. Earlier this year he had his defibrillator changed out and was in atrial fibrillation and was cardioverted with a TEE cardioversion and later ibutilide cardioversion. He has a history of hyperthyroidism that may or may not have been due to amiodarone. I had seen him recently and we were making attempts at him back in sinus rhythm. He was started back on amiodarone and we were watching his thyroid functions carefully. A recent TSH was around 2. He had cardioversion done on July 1 with one  shock and tolerated this well and was in sinus rhythm following this. According to the family he went home and was walking out to a restaurant that afternoon became somewhat diaphoretic and sweaty and was later taken home. He was admitted yesterday when he was found to be somnolent and weak and was in hypercarbic respiratory failure. He has a previous history of sleep apnea but has not been willing to wear CPAP. He has been morbidly obese for many years. He was recently treated for left lower extremity cellulitis.  Past Medical History  Diagnosis Date  . Atrial fibrillation     Cardioversion 2007  . Hypertension   . OSA (obstructive sleep apnea)        . Depression   . Gouty arthritis   . CAD (coronary artery disease)     predominantly single vessel  . Ventricular tachycardia     s/p defibrillator-Medtronic EnTrust [id number]  . Dilated cardiomyopathy     s/p defibrillator Medtronic EnTrust [phone]  . Asthma   . 1191 Lead   . Implantable Defibrillator     Medtronic Entrust  . Chronic systolic heart failure   . Hypothyroidism   . Acute renal failure 06/06/2011  . Solitary kidney 06/05/2006  . Gout   . CHF (congestive heart failure)   . Pneumonia, community acquired     bilateral bibasilar   . Shortness of breath on exertion       Past Surgical History  Procedure Laterality Date  . Cardiac defibrillator placement  2007    Medtronic EnTrust [phone]  . Nephrectomy  1976    Right  . Cardiac catheterization  2007    60% Stenosis mid-CFX  .  Knee arthroscopy      left  . Cardioversion  06/18/2011    Procedure: CARDIOVERSION;  Surgeon: Darden Palmer., MD;  Location: [facility] OR;  Service: Cardiovascular;  Laterality: N/A;  . Tee without cardioversion  07/13/2012    Procedure: TRANSESOPHAGEAL ECHOCARDIOGRAM (TEE);  Surgeon: Lewayne Bunting, MD;  Location: [facility] ENDOSCOPY;  Service: Cardiovascular;  Laterality: N/A;  . Cardioversion N/A 12/06/2012    Procedure: CARDIOVERSION;  Surgeon: Othella Boyer, MD;  Location: [facility] ENDOSCOPY;  Service: Cardiovascular;  Laterality: N/A;     Allergies:  is allergic to methimazole.   Medications: Prior to Admission medications   Medication Sig Start Date End Date Taking? Authorizing Provider  amiodarone (PACERONE) 200 MG tablet Take 200 mg by mouth 2 (two) times daily.   Yes Historical Provider, MD  aspirin 81 MG chewable tablet Chew 81 mg by mouth daily.   Yes Historical Provider, MD  cetaphil (CETAPHIL) cream Apply 1 application topically as needed.    Yes Historical Provider, MD  doxycycline (ADOXA) 100 MG tablet Take 100 mg by mouth daily.   Yes Historical Provider, MD  acetaminophen (TYLENOL) 500 MG tablet Take 1,000 mg by mouth every 6 (six) hours as needed.    Historical Provider, MD  albuterol (PROVENTIL HFA;VENTOLIN HFA) 108 (90 BASE) MCG/ACT inhaler Inhale 2 puffs into the lungs every 4 (four) hours as needed. For shortness of breath     Historical Provider, MD  allopurinol (ZYLOPRIM) 100 MG tablet Take 100 mg by mouth daily.    Historical Provider, MD  bisoprolol (ZEBETA) 10 MG tablet Take 10 mg by mouth daily.    Historical Provider, MD  budesonide-formoterol (SYMBICORT) 160-4.5 MCG/ACT inhaler Inhale 2 puffs into the lungs 2 (two) times daily.    Historical Provider, MD  fenofibrate micronized (LOFIBRA) 134 MG capsule Take 134 mg by mouth daily before breakfast.  05/21/12   Historical Provider, MD  furosemide (LASIX) 40 MG tablet Take 40 mg by mouth daily.     Historical Provider, MD  hydrALAZINE (APRESOLINE) 25 MG tablet Take 25 mg by mouth 2 (two) times daily.     Historical Provider, MD  isosorbide mononitrate (IMDUR) 60 MG 24 hr tablet Take 60 mg by mouth daily.    Historical Provider, MD  levothyroxine (SYNTHROID, LEVOTHROID) 100 MCG tablet Take 100 mcg by mouth daily.    Historical Provider, MD  loratadine (CLARITIN) 10 MG tablet Take 10 mg by mouth every morning.     Historical Provider, MD  potassium chloride SA  (K-DUR,KLOR-CON) 20 MEQ tablet Take 20 mEq by mouth every other day.    Historical Provider, MD  warfarin (COUMADIN) 5 MG tablet Take 5-7.5 mg by mouth daily. Sunday, Tuesday, Thursday, Saturday 5mg  All other days 7.5mg     Historical Provider, MD    Family History: Family Status  Relation Status Death Age  . Mother Deceased   . Father Deceased     Social History:   reports that he has never smoked. He has never used smokeless tobacco. He reports that  drinks alcohol. He reports that he does not use illicit drugs.   History   Social History Narrative   Never married.  Worked previously in [employer].    Review of Systems: Not obtainable  Physical Exam: BP 101/68  Pulse 70  Temp(Src) 98.4 F (36.9 C) (Axillary)  Resp 32  Ht 5\' 5"  (1.651 m)  Wt 119.9 kg (264 lb 5.3 oz)  BMI 43.99 kg/m2  SpO2 96%  General appearance: Very large obese white male who is currently wearing BiPAP and questions are difficult to answer Head: Normocephalic, without obvious abnormality, atraumatic Neck: Very large and thick, unable to assess JVD Lungs: Wheezing bilaterally, diminished breath sounds Heart: Distant heart sounds, normal S1-S2, no S3 Abdomen: Very large soft and nontender Rectal: deferred Extremities: Changes of chronic venous stasis noted, 1+ edema Pulses: 1+ pedals Skin: Skin color, texture, turgor normal. No rashes or lesions  Labs: CBC  Recent Labs  12/07/12 1213  12/08/12 0419  WBC 9.1  --  9.1  RBC 4.41  --  4.33  HGB 12.7*  < > 12.6*  HCT 38.6*  < > 38.4*  PLT 191  --  197  MCV 87.5  --  88.7  MCH 28.8  --  29.1  MCHC 32.9  --  32.8  RDW 15.3  --  15.5  LYMPHSABS 0.7  --   --   MONOABS 0.8  --   --   EOSABS 0.0  --   --   BASOSABS 0.0  --   --   < > = values in this interval not displayed. CMP   Recent Labs  12/08/12 0419  NA 134*  K 4.2  CL 95*  CO2 29  GLUCOSE 110*  BUN 23  CREATININE 1.31  CALCIUM 9.1  GFRNONAA 54*  GFRAA 63*   BNP (last  3 results)  Recent Labs  12/07/12 1213  PROBNP 530.9*   Cardiac Panel (last 3 results)  Recent Labs  12/07/12 1524 12/07/12 2043 12/08/12 0419  TROPONINI <0.30 <0.30 <0.30     Radiology: Poor inspiration, basilar infiltrates and possible fluid retention  EKG: Atrial paced rhythm. T wave inversions in V2 through V4 which are worse since the previous EKG in February  Signed:  W. Ashley Royalty MD [facility]   Cardiology Consultant  12/08/2012, 12:40 PM

## 2012-12-08 NOTE — Progress Notes (Signed)
ANTICOAGULATION CONSULT NOTE - Follow-up  Pharmacy Consult for coumadin Indication: atrial fibrillation  Allergies  Allergen Reactions  . Methimazole (Thiamazole) Itching and Rash    Severe rash    Patient Measurements: Height: 5\' 5"  (165.1 cm) Weight: 264 lb 5.3 oz (119.9 kg) IBW/kg (Calculated) : 61.5  Weight: 123.4 kg  Vital Signs: Temp: 98.4 F (36.9 C) (07/03 0800) Temp src: Oral (07/03 0800) BP: 133/77 mmHg (07/03 0800) Pulse Rate: 70 (07/03 0800)  Labs:  Recent Labs  12/07/12 1213 12/07/12 1226 12/07/12 1524 12/07/12 2043 12/08/12 0419  HGB 12.7* 13.9  --   --  12.6*  HCT 38.6* 41.0  --   --  38.4*  PLT 191  --   --   --  197  LABPROT 33.7*  --   --   --  38.7*  INR 3.48*  --   --   --  4.17*  CREATININE  --  1.40*  --   --  1.31  TROPONINI  --   --  <0.30 <0.30 <0.30    Estimated Creatinine Clearance: 64.8 ml/min (by C-G formula based on Cr of 1.31).  Assessment: 66 yom on chronic coumadin for afib. INR today is elevated at 4.17, no bleeding noted, CBC stable.   Goal of Therapy:  INR 2-3   Plan:  1. No coumadin tonight 2. F/u AM INR  Lysle Pearl, PharmD, BCPS Pager # [phone] 12/08/2012 9:34 AM

## 2012-12-09 ENCOUNTER — Encounter (HOSPITAL_COMMUNITY): Payer: Self-pay | Admitting: Cardiology

## 2012-12-09 ENCOUNTER — Inpatient Hospital Stay (HOSPITAL_COMMUNITY): Payer: Medicare Other

## 2012-12-09 DIAGNOSIS — Z95810 Presence of automatic (implantable) cardiac defibrillator: Secondary | ICD-10-CM

## 2012-12-09 DIAGNOSIS — I2510 Atherosclerotic heart disease of native coronary artery without angina pectoris: Secondary | ICD-10-CM

## 2012-12-09 LAB — CBC
HCT: 37 % — ABNORMAL LOW (ref 39.0–52.0)
Hemoglobin: 12.1 g/dL — ABNORMAL LOW (ref 13.0–17.0)
MCH: 28.9 pg (ref 26.0–34.0)
MCHC: 32.7 g/dL (ref 30.0–36.0)
MCV: 88.5 fL (ref 78.0–100.0)
RBC: 4.18 MIL/uL — ABNORMAL LOW (ref 4.22–5.81)

## 2012-12-09 LAB — BASIC METABOLIC PANEL
BUN: 30 mg/dL — ABNORMAL HIGH (ref 6–23)
CO2: 35 mEq/L — ABNORMAL HIGH (ref 19–32)
Calcium: 9.2 mg/dL (ref 8.4–10.5)
Glucose, Bld: 88 mg/dL (ref 70–99)
Sodium: 138 mEq/L (ref 135–145)

## 2012-12-09 LAB — PROTIME-INR: Prothrombin Time: 37.2 seconds — ABNORMAL HIGH (ref 11.6–15.2)

## 2012-12-09 LAB — PHOSPHORUS: Phosphorus: 3.1 mg/dL (ref 2.3–4.6)

## 2012-12-09 NOTE — Progress Notes (Signed)
eLink Physician-Brief Progress Note Patient Name: Cody Faulkner DOB: Nov 06, 1945 MRN: [id number]  Date of Service  12/09/2012   HPI/Events of Note   Was NPO on BiPAP now nasal cannula.  eICU Interventions  Start diabetic diet.      Sorayah Schrodt 12/09/2012, 3:25 PM

## 2012-12-09 NOTE — Progress Notes (Signed)
Subjective:  He is still using BIpap but is feeling a lot better.  Not SOB.  No chest pain.   Objective:  Vital Signs in the last 24 hours: BP 114/69  Pulse 70  Temp(Src) 98 F (36.7 C) (Axillary)  Resp 19  Ht 5\' 5"  (1.651 m)  Wt 120.2 kg (264 lb 15.9 oz)  BMI 44.1 kg/m2  SpO2 96%  Physical Exam: Obese WM alert, in NAD Lungs:  Reduced breath sounds Cardiac:  Regular rhythm, normal S1 and S2, no S3 Abdomen:  Soft, nontender, no masses Extremities: 1+ edema present  Intake/Output from previous day: 07/03 0701 - 07/04 0700 In: -  Out: 2550 [Urine:2550] Weight Filed Weights   12/07/12 1623 12/08/12 0358 12/09/12 0213  Weight: 120.3 kg (265 lb 3.4 oz) 119.9 kg (264 lb 5.3 oz) 120.2 kg (264 lb 15.9 oz)    Lab Results: Basic Metabolic Panel:  Recent Labs  40/98/11 0419 12/09/12 0530  NA 134* 138  K 4.2 4.3  CL 95* 95*  CO2 29 35*  GLUCOSE 110* 88  BUN 23 30*  CREATININE 1.31 1.58*    CBC:  Recent Labs  12/07/12 1213  12/08/12 0419 12/09/12 0530  WBC 9.1  --  9.1 8.1  NEUTROABS 7.6  --   --   --   HGB 12.7*  < > 12.6* 12.1*  HCT 38.6*  < > 38.4* 37.0*  MCV 87.5  --  88.7 88.5  PLT 191  --  197 200  < > = values in this interval not displayed.  BNP    Component Value Date/Time   PROBNP 619.8* 12/08/2012 0419    PROTIME: Lab Results  Component Value Date   INR 3.96* 12/09/2012   INR 4.17* 12/08/2012   INR 3.48* 12/07/2012    Telemetry: Sinus rhythm  Assessment/Plan:  1. Obesity hypoventilaltion syndrome 2. Atrial fibrillation in normal sinus rhythm 3. Acute diastolic CHF  Rec:  Wonder if he could come off of Bipap soon. He is much more alert.  Diurese a bit more. ECHO showed preserved systolic function.  Darden Palmer  MD [facility]  12/09/2012, 10:33 AM

## 2012-12-09 NOTE — Progress Notes (Signed)
PULMONARY  / CRITICAL CARE MEDICINE  Name: Cody Faulkner MRN: [id number] DOB: 08/11/1945    ADMISSION DATE:  12/07/2012  REFERRING MD :  Dr. Rubin Payor PRIMARY SERVICE:  PCCM  CHIEF COMPLAINT:  Acute Respiratory Failure  BRIEF PATIENT DESCRIPTION: 67 y/o M with OSA / OHS (untreated) who presented to [facility] ER with increased SOB, sleepiness and dull chest pain.  Found to have hypercarbic respiratory failure and PCCM called for ICU admit. Of note, patient underwent cardioversion on 7/1 per Dr. Donnie Aho.  Supratherapeutic INR on presentation.   SIGNIFICANT EVENTS / STUDIES:  7/01 - cardioversion per Dr. Donnie Aho.  Received 70 mg propofol for procedure with successful cardioversion.   ....................................................................................................................................................................... 7/02 - admitted with decompensated OHS 7/3 bipap required  CULTURES:   ANTIBIOTICS:   SUBJECTIVE:  bipap still required, neg 2.5 liters, feels better  VITAL SIGNS: Temp:  [98 F (36.7 C)-98.5 F (36.9 C)] 98 F (36.7 C) (07/04 0757) Pulse Rate:  [69-77] 69 (07/04 1045) Resp:  [12-32] 19 (07/04 1045) BP: (88-117)/(54-93) 88/54 mmHg (07/04 1000) SpO2:  [87 %-100 %] 97 % (07/04 1045) FiO2 (%):  [40 %] 40 % (07/04 1122) Weight:  [120.2 kg (264 lb 15.9 oz)] 120.2 kg (264 lb 15.9 oz) (07/04 0213)  PHYSICAL EXAMINATION: General:  Chronically ill, morbidly obese male on BiPAP Neuro:  Awake, follows commands HEENT:  Short thick neck, mm pink/moist Cardiovascular:  s1s2 rrr, no m/r/g Lungs:  distant Abdomen:  Obese, soft Musculoskeletal:  No acute deformities Skin:  Warm/dry, BLE edema 2-3+ pitting with chronic venous changes   Recent Labs Lab 12/07/12 1226 12/08/12 0419 12/09/12 0530  NA 135 134* 138  K 4.0 4.2 4.3  CL 97 95* 95*  CO2  --  29 35*  BUN 25* 23 30*  CREATININE 1.40* 1.31 1.58*  GLUCOSE 109* 110* 88    Recent  Labs Lab 12/07/12 1213 12/07/12 1226 12/08/12 0419 12/09/12 0530  HGB 12.7* 13.9 12.6* 12.1*  HCT 38.6* 41.0 38.4* 37.0*  WBC 9.1  --  9.1 8.1  PLT 191  --  197 200   Ct Head Wo Contrast  12/08/2012   *RADIOLOGY REPORT*  Clinical Data: Somnolence, exclude intracranial bleed.  CT HEAD WITHOUT CONTRAST  Technique:  Contiguous axial images were obtained from the base of the skull through the vertex without contrast.  Comparison: None.  Findings: No skull fracture is noted.  Paranasal sinuses and mastoid air cells are unremarkable.  No intracranial hemorrhage, mass effect or midline shift.  No mass lesion is noted on this unenhanced scan.  Paranasal sinuses and mastoid air cells are unremarkable.  Mild cerebral atrophy.  No acute infarction.  No mass lesion is noted on this unenhanced scan.  IMPRESSION: No acute intracranial abnormality.  Mild cerebral atrophy.   Original Report Authenticated By: Natasha Mead, M.D.   Dg Chest Port 1 View  12/09/2012   *RADIOLOGY REPORT*  Clinical Data: Respiratory failure  PORTABLE CHEST - 1 VIEW  Comparison: Prior chest x-ray 12/08/2012  Findings: Stable position of left subclavian approach cardiac rhythm maintenance device with leads projecting over the right atrium and right ventricle.  Stable cardiomegaly.  Probable bilateral layering effusions and associated bibasilar opacities. The degree of pulmonary vascular congestion and edema has improved. No pneumothorax.  Azygos fissure in the right upper lobe.  IMPRESSION:  1.  Improving pulmonary edema. 2.  Persistent bibasilar opacities likely reflecting a combination of layering pleural effusions with atelectasis and / or infiltrate. 3.  Stable cardiomegaly   Original Report Authenticated By: Malachy Moan, M.D.   Dg Chest Port 1 View  12/08/2012   *RADIOLOGY REPORT*  Clinical Data: Shortness of breath.  Evaluate for airspace disease.  PORTABLE CHEST - 1 VIEW  Comparison: 12/07/2012.  Findings: Stable moderate cardiac  silhouette enlargement is evident.  Multiple lead AICD is in place with controller device on the left. Ectasia and nonaneurysmal calcification of the thoracic aorta are seen.  Mediastinal and hilar contours appear stable. There is vascular congestion pattern.  Haziness and patchy pulmonary infiltrative densities are seen in the perihilar region and in both lung bases.  This could reflect edema and / or pneumonia.  Indistinctness of costophrenic angles may reflect atelectasis and pleural effusions.  Osteophytes are present in the spine.  IMPRESSION: Stable cardiac silhouette enlargement.  AICD in place. There is vascular congestion pattern.  Haziness and patchy pulmonary infiltrative densities are seen in the perihilar region and in both lung bases.  This could reflect edema and / or pneumonia. Indistinctness of costophrenic angles may reflect atelectasis and pleural effusions.   Original Report Authenticated By: Onalee Hua Call   ASSESSMENT / PLAN:  Acute on Chronic Respiratory Failure - never smoker. OSA / OHS - with acute decompensation with pulmonary edema on CXR.  Plan: - lasix -dc bipap and observe status, low threshold to cycle back on or prn - Repeat cxr in am to evaluate resolution of pulmonary edema. - Schedule BD's, nebulized ICS. - PRN BD's. - Continue claritin. - Avoid sedating medications. - Diureses may be limited to some degree with renals  Atrial Fibrillation - s/p successful cardioversion on 7/1, chronic coumadin HTN CAD s/p AICD Chronic Systolic CHF Chest Pain - supratherapeutic INR on admit  Plan: - ASA - Continue bisoprolol, hydralazine, imdur. - Lasix, may need reduction with crt bump or dc - Enzymes negative, cards seeing  Acute Renal Insufficiency / AKI - baseline solitary kidney  Plan: - Continue lasix, reduce - Repeat BMP in am  Coagulopathy - chronic coumadin administration  Plan: - Continue coumadin per pharmacy, held today - No need to reverse coumadin  unless bleeding noted  Hypothyroidism Gout  Plan: - Hold allopurinol  - Continue synthroid  Remain in sdu, dc bipap eval needs from here, hold lasix  Mcarthur Rossetti. Tyson Alias, MD, FACP Pgr: [phone] [place] [facility]

## 2012-12-09 NOTE — Progress Notes (Signed)
ANTICOAGULATION CONSULT NOTE - Follow-up  Pharmacy Consult for coumadin Indication: atrial fibrillation  Allergies  Allergen Reactions  . Methimazole (Thiamazole) Itching and Rash    Severe rash    Patient Measurements: Height: 5\' 5"  (165.1 cm) Weight: 264 lb 15.9 oz (120.2 kg) IBW/kg (Calculated) : 61.5  Weight: 123.4 kg  Vital Signs: Temp: 98 F (36.7 C) (07/04 0757) Temp src: Axillary (07/04 0757) BP: 114/69 mmHg (07/04 0757) Pulse Rate: 70 (07/04 0757)  Labs:  Recent Labs  12/07/12 1213 12/07/12 1226 12/07/12 1524 12/07/12 2043 12/08/12 0419 12/09/12 0530  HGB 12.7* 13.9  --   --  12.6* 12.1*  HCT 38.6* 41.0  --   --  38.4* 37.0*  PLT 191  --   --   --  197 200  LABPROT 33.7*  --   --   --  38.7* 37.2*  INR 3.48*  --   --   --  4.17* 3.96*  CREATININE  --  1.40*  --   --  1.31 1.58*  TROPONINI  --   --  <0.30 <0.30 <0.30  --     Estimated Creatinine Clearance: 53.8 ml/min (by C-G formula based on Cr of 1.58).  Assessment: 66 yom on chronic coumadin for afib. INR today remains elevated at 3.48, no bleeding noted, CBC stable.   Goal of Therapy:  INR 2-3   Plan:  1. No coumadin tonight 2. F/u AM INR  [employer], Pharm.D., BCPS Clinical Pharmacist Pager [phone] 12/09/2012 9:01 AM

## 2012-12-09 NOTE — Progress Notes (Signed)
Pt placed on 50% venti mask - no distress at this time

## 2012-12-10 ENCOUNTER — Inpatient Hospital Stay (HOSPITAL_COMMUNITY): Payer: Medicare Other

## 2012-12-10 LAB — BASIC METABOLIC PANEL
Chloride: 95 mEq/L — ABNORMAL LOW (ref 96–112)
GFR calc Af Amer: 61 mL/min — ABNORMAL LOW (ref >90)
GFR calc non Af Amer: 52 mL/min — ABNORMAL LOW (ref >90)
Glucose, Bld: 122 mg/dL — ABNORMAL HIGH (ref 70–99)
Potassium: 3.9 mEq/L (ref 3.5–5.1)
Sodium: 136 mEq/L (ref 135–145)

## 2012-12-10 LAB — MAGNESIUM: Magnesium: 2.4 mg/dL (ref 1.5–2.5)

## 2012-12-10 MED ORDER — WARFARIN SODIUM 2.5 MG PO TABS
2.5000 mg | ORAL_TABLET | Freq: Once | ORAL | Status: AC
  Administered 2012-12-10: 2.5 mg via ORAL
  Filled 2012-12-10: qty 1

## 2012-12-10 MED ORDER — PANTOPRAZOLE SODIUM 40 MG PO TBEC
40.0000 mg | DELAYED_RELEASE_TABLET | Freq: Two times a day (BID) | ORAL | Status: DC
  Administered 2012-12-10 – 2012-12-12 (×4): 40 mg via ORAL
  Filled 2012-12-10 (×4): qty 1

## 2012-12-10 MED ORDER — BIOTENE DRY MOUTH MT LIQD
15.0000 mL | Freq: Two times a day (BID) | OROMUCOSAL | Status: DC
  Administered 2012-12-11 – 2012-12-14 (×6): 15 mL via OROMUCOSAL

## 2012-12-10 NOTE — Progress Notes (Signed)
PULMONARY  / CRITICAL CARE MEDICINE  Name: Cody Faulkner MRN: [id number] DOB: [birth year]    ADMISSION DATE:  12/07/2012  REFERRING MD :  Dr. Rubin Payor PRIMARY SERVICE:  PCCM  CHIEF COMPLAINT:  Acute Respiratory Failure  BRIEF PATIENT DESCRIPTION: 25 yowm with OSA / OHS (untreated) who presented to [facility] ER with increased SOB, sleepiness and dull chest pain.  Found to have hypercarbic respiratory failure and PCCM called for ICU admit. Of note, patient underwent cardioversion on 7/1 per Dr. Donnie Aho.  Supratherapeutic INR on presentation.   SIGNIFICANT EVENTS / STUDIES:  7/01 - cardioversion per Dr. Donnie Aho.  Received 70 mg propofol for procedure with successful cardioversion.   ....................................................................................................................................................................... 7/02 - admitted with decompensated OHS 7/3 bipap required 7/4 slept off bipap  SUBJECTIVE:  Slept off bipap, very gruff voice but says feeling better   VITAL SIGNS: Temp:  [97.5 F (36.4 C)-99.5 F (37.5 C)] 98.5 F (36.9 C) (07/05 0800) Pulse Rate:  [69-72] 70 (07/05 0412) Resp:  [14-30] 19 (07/05 0412) BP: (88-115)/(51-73) 115/58 mmHg (07/05 0412) SpO2:  [92 %-98 %] 94 % (07/05 0915) FiO2 (%):  [40 %-50 %] 50 % (07/04 1430)  PHYSICAL EXAMINATION: General:  Chronically ill, morbidly obese male on nasal 02/prominent pseudowheeze/ gruff voice Neuro:  Awake, follows commands HEENT:  Short thick neck, mm pink/moist Cardiovascular:  s1s2 rrr, no m/r/g Lungs:  distant Abdomen:  Obese, soft Musculoskeletal:  No acute deformities Skin:  Warm/dry, BLE edema 2-3+ pitting with chronic venous changes   Recent Labs Lab 12/08/12 0419 12/09/12 0530 12/10/12 0625  NA 134* 138 136  K 4.2 4.3 3.9  CL 95* 95* 95*  CO2 29 35* 32  BUN 23 30* 35*  CREATININE 1.31 1.58* 1.35  GLUCOSE 110* 88 122*    Recent Labs Lab 12/07/12 1213 12/07/12 1226  12/08/12 0419 12/09/12 0530  HGB 12.7* 13.9 12.6* 12.1*  HCT 38.6* 41.0 38.4* 37.0*  WBC 9.1  --  9.1 8.1  PLT 191  --  197 200   Ct Head Wo Contrast  12/08/2012   *RADIOLOGY REPORT*  Clinical Data: Somnolence, exclude intracranial bleed.  CT HEAD WITHOUT CONTRAST  Technique:  Contiguous axial images were obtained from the base of the skull through the vertex without contrast.  Comparison: None.  Findings: No skull fracture is noted.  Paranasal sinuses and mastoid air cells are unremarkable.  No intracranial hemorrhage, mass effect or midline shift.  No mass lesion is noted on this unenhanced scan.  Paranasal sinuses and mastoid air cells are unremarkable.  Mild cerebral atrophy.  No acute infarction.  No mass lesion is noted on this unenhanced scan.  IMPRESSION: No acute intracranial abnormality.  Mild cerebral atrophy.   Original Report Authenticated By: Natasha Mead, M.D.   Dg Chest Port 1 View  12/10/2012   *RADIOLOGY REPORT*  Clinical Data: Assess edema  PORTABLE CHEST - 1 VIEW  Comparison: 12/09/2012  Findings: There is a left chest wall ICD with leads in the right atrial appendage and right ventricle.  Mild cardiac enlargement is stable.  Bilateral, basilar opacities are unchanged from previous exam.  IMPRESSION:  1.  No change in bibasilar airspace opacities.   Original Report Authenticated By: Signa Kell, M.D.   Dg Chest Port 1 View  12/09/2012   *RADIOLOGY REPORT*  Clinical Data: Respiratory failure  PORTABLE CHEST - 1 VIEW  Comparison: Prior chest x-ray 12/08/2012  Findings: Stable position of left subclavian approach cardiac rhythm maintenance device with leads projecting  over the right atrium and right ventricle.  Stable cardiomegaly.  Probable bilateral layering effusions and associated bibasilar opacities. The degree of pulmonary vascular congestion and edema has improved. No pneumothorax.  Azygos fissure in the right upper lobe.  IMPRESSION:  1.  Improving pulmonary edema. 2.  Persistent  bibasilar opacities likely reflecting a combination of layering pleural effusions with atelectasis and / or infiltrate. 3.  Stable cardiomegaly   Original Report Authenticated By: Malachy Moan, M.D.   ASSESSMENT / PLAN:  Acute on Chronic Respiratory Failure - never smoker. OSA / OHS - with acute decompensation with pulmonary edema on CXR. Prominent pseudowheeze noted 12/10/2012   Plan: - lasix per Cards/Tilley - Schedule BD's, nebulized ICS. - PRN BD's. - Continue claritin. - Avoid sedating medications. - Max GERD rx     Atrial Fibrillation - s/p successful cardioversion on 7/1, chronic coumadin HTN CAD s/p AICD Chronic Systolic CHF Chest Pain - supratherapeutic INR on admit Plan: per Cards/Tilly   Acute Renal Insufficiency / AKI -   solitary kidney baseline creat 1.4 07/14/12 Lab Results  Component Value Date   CREATININE 1.35 12/10/2012   CREATININE 1.58* 12/09/2012   CREATININE 1.31 12/08/2012   back to baseline > no further f/u needed    Coagulopathy - chronic coumadin administration  Plan: - Continue coumadin per pharmacy    Hypothyroidism Gout  Plan: - Hold allopurinol  - Continue synthroid     Sandrea Hughs, MD Pulmonary and Critical Care Medicine [facility] Cell [phone] After 5:30 PM or weekends, call [phone]

## 2012-12-10 NOTE — Progress Notes (Signed)
Subjective:  Off of Bipap.  He feels fairly well. Only mild SOB.  No chest pain.  Renal function stable.  Objective:  Vital Signs in the last 24 hours: BP 115/58  Pulse 70  Temp(Src) 98.5 F (36.9 C) (Oral)  Resp 19  Ht 5\' 5"  (1.651 m)  Wt 120.2 kg (264 lb 15.9 oz)  BMI 44.1 kg/m2  SpO2 94%  Physical Exam: Obese WM alert, in NAD Lungs:  Reduced breath sounds Cardiac:  Regular rhythm, normal S1 and S2, no S3 Abdomen:  Soft, nontender, no masses Extremities: 1+ edema present  Intake/Output from previous day: 07/04 0701 - 07/05 0700 In: 580 [P.O.:580] Out: 1310 [Urine:1310] Weight Filed Weights   12/07/12 1623 12/08/12 0358 12/09/12 0213  Weight: 120.3 kg (265 lb 3.4 oz) 119.9 kg (264 lb 5.3 oz) 120.2 kg (264 lb 15.9 oz)    Lab Results: Basic Metabolic Panel:  Recent Labs  16/10/96 0530 12/10/12 0625  NA 138 136  K 4.3 3.9  CL 95* 95*  CO2 35* 32  GLUCOSE 88 122*  BUN 30* 35*  CREATININE 1.58* 1.35    CBC:  Recent Labs  12/07/12 1213  12/08/12 0419 12/09/12 0530  WBC 9.1  --  9.1 8.1  NEUTROABS 7.6  --   --   --   HGB 12.7*  < > 12.6* 12.1*  HCT 38.6*  < > 38.4* 37.0*  MCV 87.5  --  88.7 88.5  PLT 191  --  197 200  < > = values in this interval not displayed.  BNP    Component Value Date/Time   PROBNP 619.8* 12/08/2012 0419    PROTIME: Lab Results  Component Value Date   INR 3.32* 12/10/2012   INR 3.96* 12/09/2012   INR 4.17* 12/08/2012    Telemetry: Sinus rhythm  Assessment/Plan:  1. Obesity hypoventilaltion syndrome 2. Atrial fibrillation in normal sinus rhythm 3. Acute diastolic CHF clinically better.  BNP was not that high.  4. Warfarin anticoagulation still elevated.  Rec:  Better.  I would d/c foley and ambulate.  ? Send to floor but will defer to CCM>  W. Ashley Royalty  MD [facility] Cardiology  12/10/2012, 8:16 AM

## 2012-12-10 NOTE — Progress Notes (Signed)
ANTICOAGULATION CONSULT NOTE - Follow-up  Pharmacy Consult for coumadin Indication: atrial fibrillation  Allergies  Allergen Reactions  . Methimazole (Thiamazole) Itching and Rash    Severe rash    Patient Measurements: Height: 5\' 5"  (165.1 cm) Weight: 264 lb 15.9 oz (120.2 kg) IBW/kg (Calculated) : 61.5  Weight: 123.4 kg  Vital Signs: Temp: 98.5 F (36.9 C) (07/05 0800) Temp src: Oral (07/05 0800) BP: 111/59 mmHg (07/05 0800) Pulse Rate: 70 (07/05 1000)  Labs:  Recent Labs  12/07/12 1213  12/07/12 1226 12/07/12 1524 12/07/12 2043 12/08/12 0419 12/09/12 0530 12/10/12 0625  HGB 12.7*  --  13.9  --   --  12.6* 12.1*  --   HCT 38.6*  --  41.0  --   --  38.4* 37.0*  --   PLT 191  --   --   --   --  197 200  --   LABPROT 33.7*  --   --   --   --  38.7* 37.2* 32.5*  INR 3.48*  --   --   --   --  4.17* 3.96* 3.32*  CREATININE  --   < > 1.40*  --   --  1.31 1.58* 1.35  TROPONINI  --   --   --  <0.30 <0.30 <0.30  --   --   < > = values in this interval not displayed.  Estimated Creatinine Clearance: 63 ml/min (by C-G formula based on Cr of 1.35).  Assessment: 67 y.o. M on warfarin PTA for hx Afib. Doses were held 7/3 and 7/4 due to an elevated INR. INR this morning remains SUPRAtherapeutic (3.32 << 3.96) however is trending down quickly. Will plan to resume a low-dose this evening to avoid the INR dropping too low. The patient is now eating more consistently (100% of breakfast today). No CBC today, no overt s/sx of bleeding noted.   Goal of Therapy:  INR 2-3   Plan:  1. Warfarin 2.5 mg x 1 dose at 1800 today 2. Will continue to monitor for any signs/symptoms of bleeding and will follow up with PT/INR in the a.m.   Georgina Pillion, PharmD, BCPS Clinical Pharmacist Pager: [phone] 12/10/2012 10:38 AM

## 2012-12-11 ENCOUNTER — Inpatient Hospital Stay (HOSPITAL_COMMUNITY): Payer: Medicare Other

## 2012-12-11 DIAGNOSIS — R918 Other nonspecific abnormal finding of lung field: Secondary | ICD-10-CM

## 2012-12-11 LAB — PROTIME-INR
INR: 2.08 — ABNORMAL HIGH (ref 0.00–1.49)
Prothrombin Time: 22.7 seconds — ABNORMAL HIGH (ref 11.6–15.2)

## 2012-12-11 MED ORDER — FUROSEMIDE 40 MG PO TABS
40.0000 mg | ORAL_TABLET | Freq: Two times a day (BID) | ORAL | Status: DC
  Administered 2012-12-11 – 2012-12-12 (×3): 40 mg via ORAL
  Filled 2012-12-11 (×5): qty 1

## 2012-12-11 MED ORDER — WARFARIN SODIUM 6 MG PO TABS
6.0000 mg | ORAL_TABLET | Freq: Once | ORAL | Status: AC
  Administered 2012-12-11: 6 mg via ORAL
  Filled 2012-12-11: qty 1

## 2012-12-11 NOTE — Progress Notes (Addendum)
PULMONARY  / CRITICAL CARE MEDICINE  Name: Cody Faulkner MRN: [id number] DOB: 1945-07-31    ADMISSION DATE:  12/07/2012  REFERRING MD :  Dr. Rubin Payor PRIMARY SERVICE:  PCCM  CHIEF COMPLAINT:  Acute Respiratory Failure  BRIEF PATIENT DESCRIPTION: 89 yowm with OSA / OHS (untreated) who presented to [facility] ER with increased SOB, sleepiness and dull chest pain.  Found to have hypercarbic respiratory failure and PCCM called for ICU admit. Of note, patient underwent cardioversion on 7/1 per Dr. Donnie Aho.  Supratherapeutic INR on presentation.   SIGNIFICANT EVENTS / STUDIES:  7/01 - cardioversion per Dr. Donnie Aho.  Received 70 mg propofol for procedure with successful cardioversion.   ....................................................................................................................................................................... 7/02 - admitted with decompensated OHS 7/3 bipap required 7/4 slept off bipap and remained off thereafter  SUBJECTIVE:    very gruff voice but says feeling better and this is how he talks  VITAL SIGNS: Temp:  [98.5 F (36.9 C)-99.3 F (37.4 C)] 98.5 F (36.9 C) (07/06 0800) Pulse Rate:  [69-76] 76 (07/06 0404) Resp:  [16-29] 24 (07/06 0404) BP: (107-130)/(54-76) 108/64 mmHg (07/06 0800) SpO2:  [92 %-99 %] 96 % (07/06 0848) Weight:  [262 lb 5.6 oz (119 kg)] 262 lb 5.6 oz (119 kg) (07/06 0404) FI02  3lpm NP  PHYSICAL EXAMINATION: General:  Chronically ill, morbidly obese male on nasal 02/prominent pseudowheeze/ gruff voice Neuro:  Awake, follows commands HEENT:  Short thick neck, mm pink/moist Cardiovascular:  s1s2 rrr, no m/r/g Lungs:  distant Abdomen:  Obese, soft Musculoskeletal:  No acute deformities Skin:  Warm/dry, BLE edema 2-3+ pitting with chronic venous changes   Recent Labs Lab 12/09/12 0530 12/10/12 0625 12/11/12 0520  NA 138 136 137  K 4.3 3.9 4.0  CL 95* 95* 99  CO2 35* 32 36*  BUN 30* 35* 31*  CREATININE 1.58* 1.35  1.13  GLUCOSE 88 122* 108*    Recent Labs Lab 12/07/12 1213 12/07/12 1226 12/08/12 0419 12/09/12 0530  HGB 12.7* 13.9 12.6* 12.1*  HCT 38.6* 41.0 38.4* 37.0*  WBC 9.1  --  9.1 8.1  PLT 191  --  197 200   Dg Chest Port 1 View  12/10/2012   *RADIOLOGY REPORT*  Clinical Data: Assess edema  PORTABLE CHEST - 1 VIEW  Comparison: 12/09/2012  Findings: There is a left chest wall ICD with leads in the right atrial appendage and right ventricle.  Mild cardiac enlargement is stable.  Bilateral, basilar opacities are unchanged from previous exam.  IMPRESSION:  1.  No change in bibasilar airspace opacities.   Original Report Authenticated By: Signa Kell, M.D.   ASSESSMENT / PLAN:  Acute on Chronic Respiratory Failure - never smoker. OSA / OHS - with acute decompensation with pulmonary edema on CXR. Prominent pseudowheeze noted 12/11/2012  Bilateral pulmonary infiltrates  Plan: - lasix per Cards/Tilley - Schedule BD's, nebulized ICS. - PRN BD's. - Continue claritin. - Avoid sedating medications. - Max GERD rx  - F/u cxr/esr/bnp 7/6  ? amiodarone vs aspiration cause of bilateral infiltrates - Will need f/u with pfts as outpt    Atrial Fibrillation - s/p successful cardioversion on 7/1, chronic coumadin HTN CAD s/p AICD Chronic Systolic CHF Chest Pain - supratherapeutic INR on admit Plan: per Cards/Tilly   Acute Renal Insufficiency / AKI -   solitary kidney baseline creat 1.4 07/14/12 Lab Results  Component Value Date   CREATININE 1.13 12/11/2012   CREATININE 1.35 12/10/2012   CREATININE 1.58* 12/09/2012   back to baseline >  no further f/u needed    Coagulopathy - chronic coumadin administration  Plan: - Continue coumadin per pharmacy    Hypothyroidism with nl tsh 12/08/12 Gout  Plan: - Hold allopurinol  - Continue synthroid same dose     Sandrea Hughs, MD Pulmonary and Critical Care Medicine [facility] Cell [phone] After 5:30 PM or weekends, call [phone]

## 2012-12-11 NOTE — Evaluation (Signed)
Physical Therapy Evaluation Patient Details Name: LAMEL MCCARLEY MRN: [id number] DOB: 1946-05-24 Today's Date: 12/11/2012 Time: 1200-1221 PT Time Calculation (min): 21 min  PT Assessment / Plan / Recommendation History of Present Illness  BRIEF PATIENT DESCRIPTION: 48 yowm with OSA / OHS (untreated) who presented to [facility] ER with increased SOB, sleepiness and dull chest pain.  Found to have hypercarbic respiratory failure and PCCM called for ICU admit. Of note, patient underwent cardioversion on 7/1 per Dr. Donnie Aho.  Supratherapeutic INR on presentation.    Clinical Impression  Pt admitted with hypercarbic respiratory failure. Pt currently with functional limitations due to the deficits listed below (see PT Problem List).   Pt will benefit from skilled PT to increase their independence and safety with mobility to allow discharge to the venue listed below.       PT Assessment  Patient needs continued PT services    Follow Up Recommendations  CIR Would pt be able to attend a wedding on July 12th with a day pass from CIR?    Does the patient have the potential to tolerate intense rehabilitation      Barriers to Discharge Decreased caregiver support Can family pool resources to provide 24 hour assist to pt?    Equipment Recommendations  3in1 (PT) (consider wheelchair, drop-arm bsc)    Recommendations for Other Services OT consult;Rehab consult   Frequency Min 3X/week    Precautions / Restrictions Precautions Precautions: Fall Precaution Comments: Watch O2 sats on Room Air Restrictions Other Position/Activity Restrictions: Very painful L knee with Wbing   Pertinent Vitals/Pain Noted pt's O2 sats decr to 87% with activity on RA; incr back to 100% with O2 restarted via [place]       Mobility  Bed Mobility Bed Mobility: Supine to Sit;Sitting - Scoot to Edge of Bed Supine to Sit: 4: Min assist;With rails Sitting - Scoot to [place] of Bed: 4: Min assist;With rail Details for Bed Mobility  Assistance: inefficient movement; cues for technique Transfers Transfers: Sit to Stand;Stand to [employer] Transfers Sit to Stand: 3: Mod assist;From bed;With upper extremity assist Stand to Sit: 3: Mod assist;To chair/3-in-1;With upper extremity assist Stand Pivot Transfers: 3: Mod assist Details for Transfer Assistance: Difficulty accepting weight onto painful L knee; Cues for technique, safety and hand placement; Attempted to take steps to chair instead of pivot, however pt unable to advance RLE, and pivoted into an uncontrolled descent to the very edge of the chair; then pt was able to use armrests to scoot center of mass safely back into chair Ambulation/Gait Ambulation/Gait Assistance: Not tested (comment) (Attempted, but unable to advance RLE)    Exercises     PT Diagnosis: Difficulty walking;Abnormality of gait;Generalized weakness;Acute pain  PT Problem List: Decreased strength;Decreased range of motion;Decreased activity tolerance;Decreased balance;Decreased mobility;Decreased coordination;Decreased knowledge of use of DME;Decreased safety awareness;Decreased knowledge of precautions;Pain PT Treatment Interventions: DME instruction;Gait training;Stair training;Functional mobility training;Therapeutic activities;Therapeutic exercise;Balance training;Patient/family education     PT Goals(Current goals can be found in the care plan section) Acute Rehab PT Goals Patient Stated Goal: Really wants to go to cousin's wedding on July 12th PT Goal Formulation: With patient Time For Goal Achievement: 12/25/12 Potential to Achieve Goals: Fair  Visit Information  Last PT Received On: 12/11/12 Assistance Needed: +2 History of Present Illness: BRIEF PATIENT DESCRIPTION: 64 yowm with OSA / OHS (untreated) who presented to [facility] ER with increased SOB, sleepiness and dull chest pain.  Found to have hypercarbic respiratory failure and PCCM called for ICU  admit. Of note, patient underwent  cardioversion on 7/1 per Dr. Donnie Aho.  Supratherapeutic INR on presentation.         Prior Functioning  Home Living Family/patient expects to be discharged to:: Private residence Living Arrangements: Alone Available Help at Discharge: Family;Available PRN/intermittently Type of Home: Mobile home Home Access: Stairs to enter Entrance Stairs-Number of Steps: 3-5 Entrance Stairs-Rails: Right;Left;Can reach both Home Layout: One level Home Equipment: Walker - 2 wheels Prior Function Level of Independence: Independent with assistive device(s) Comments: Apparently eatsout a lot with his cousin, who drives Communication Communication: HOH Marine scientist voice)    Cognition  Cognition Arousal/Alertness: Awake/alert Behavior During Therapy: WFL for tasks assessed/performed Overall Cognitive Status: Within Functional Limits for tasks assessed    Extremity/Trunk Assessment Upper Extremity Assessment Upper Extremity Assessment: Defer to OT evaluation Lower Extremity Assessment Lower Extremity Assessment: Generalized weakness;LLE deficits/detail LLE Deficits / Details: Increased pain with WBing L knee, which is limiting his weight acceptance into stance, and his ability to step RLR LLE: Unable to fully assess due to pain   Balance    End of Session PT - End of Session Equipment Utilized During Treatment: Gait belt;Oxygen Activity Tolerance: Patient limited by pain (L knee pain with WBing) Patient left: in chair;with call bell/phone within reach Nurse Communication: Mobility status  GP     Van Clines [facility] [place], [place] [phone]  12/11/2012, 1:08 PM

## 2012-12-11 NOTE — Progress Notes (Addendum)
Subjective:  Slept well last night.  Not SOB. Legs weak when gets up.  PT has not seen.  Objective:  Vital Signs in the last 24 hours: BP 116/62  Pulse 76  Temp(Src) 98.6 F (37 C) (Oral)  Resp 24  Ht 5\' 5"  (1.651 m)  Wt 119 kg (262 lb 5.6 oz)  BMI 43.66 kg/m2  SpO2 99%  Physical Exam: Obese WM alert, in NAD Lungs:  Reduced breath sounds Cardiac:  Regular rhythm, normal S1 and S2, no S3 Abdomen:  Soft, nontender, no masses Extremities: 1+ edema present, changes of chronic venous disease  Intake/Output from previous day: 07/05 0701 - 07/06 0700 In: 1080 [P.O.:1080] Out: 1000 [Urine:1000] Weight Filed Weights   12/08/12 0358 12/09/12 0213 12/11/12 0404  Weight: 119.9 kg (264 lb 5.3 oz) 120.2 kg (264 lb 15.9 oz) 119 kg (262 lb 5.6 oz)    Lab Results: Basic Metabolic Panel:  Recent Labs  16/10/96 0625 12/11/12 0520  NA 136 137  K 3.9 4.0  CL 95* 99  CO2 32 36*  GLUCOSE 122* 108*  BUN 35* 31*  CREATININE 1.35 1.13    CBC:  Recent Labs  12/09/12 0530  WBC 8.1  HGB 12.1*  HCT 37.0*  MCV 88.5  PLT 200    BNP    Component Value Date/Time   PROBNP 619.8* 12/08/2012 0419    PROTIME: Lab Results  Component Value Date   INR 2.08* 12/11/2012   INR 3.32* 12/10/2012   INR 3.96* 12/09/2012    Telemetry: Sinus rhythm (pace atrial)  Assessment/Plan:  1. Obesity hypoventilaltion syndrome 2. Atrial fibrillation in normal sinus rhythm 3. Acute diastolic CHF clinically better.  BNP was not that high.  4. Warfarin anticoagulation still elevated.  Rec:  Better.I would send to floor and get PT to see.  I will check PA and lateral chest Xray.  Darden Palmer  MD [facility] Cardiology  12/11/2012, 8:08 AM

## 2012-12-11 NOTE — Progress Notes (Signed)
ANTICOAGULATION CONSULT NOTE - Follow-up  Pharmacy Consult for coumadin Indication: atrial fibrillation  Allergies  Allergen Reactions  . Methimazole (Thiamazole) Itching and Rash    Severe rash    Patient Measurements: Height: 5\' 5"  (165.1 cm) Weight: 262 lb 5.6 oz (119 kg) IBW/kg (Calculated) : 61.5  Weight: 123.4 kg  Vital Signs: Temp: 98.5 F (36.9 C) (07/06 0800) Temp src: Oral (07/06 0800) BP: 108/64 mmHg (07/06 0800) Pulse Rate: 76 (07/06 0404)  Labs:  Recent Labs  12/09/12 0530 12/10/12 0625 12/11/12 0520  HGB 12.1*  --   --   HCT 37.0*  --   --   PLT 200  --   --   LABPROT 37.2* 32.5* 22.7*  INR 3.96* 3.32* 2.08*  CREATININE 1.58* 1.35 1.13    Estimated Creatinine Clearance: 74.8 ml/min (by C-G formula based on Cr of 1.13).  Assessment: 67 y.o. M on warfarin PTA for hx Afib. Doses were held 7/3 and 7/4 due to an elevated INR. Warfarin was resumed on 7/5 and INR this morning is therapeutic however is still dropping rapidly (INR 2.08 << 3.32, goal of 2-3). Pt eating 100% of meals, no CBC today, no overt s/sx of bleeding noted. The patient was re-educated on warfarin this admission.   Goal of Therapy:  INR 2-3   Plan:  1. Warfarin 6 mg x 1 dose at 1800 today 2. Will continue to monitor for any signs/symptoms of bleeding and will follow up with PT/INR in the a.m.   Georgina Pillion, PharmD, BCPS Clinical Pharmacist Pager: [phone] 12/11/2012 8:45 AM

## 2012-12-12 ENCOUNTER — Encounter (HOSPITAL_COMMUNITY): Payer: Self-pay | Admitting: Physical Medicine and Rehabilitation

## 2012-12-12 ENCOUNTER — Inpatient Hospital Stay (HOSPITAL_COMMUNITY): Payer: Medicare Other

## 2012-12-12 DIAGNOSIS — R5381 Other malaise: Secondary | ICD-10-CM

## 2012-12-12 LAB — BASIC METABOLIC PANEL
BUN: 31 mg/dL — ABNORMAL HIGH (ref 6–23)
BUN: 31 mg/dL — ABNORMAL HIGH (ref 6–23)
Calcium: 8.8 mg/dL (ref 8.4–10.5)
Creatinine, Ser: 1.13 mg/dL (ref 0.50–1.35)
Creatinine, Ser: 1.24 mg/dL (ref 0.50–1.35)
GFR calc Af Amer: 75 mL/min — ABNORMAL LOW (ref >90)
GFR calc non Af Amer: 65 mL/min — ABNORMAL LOW (ref >90)
GFR calc non Af Amer: 58 mL/min — ABNORMAL LOW (ref >90)
Glucose, Bld: 105 mg/dL — ABNORMAL HIGH (ref 70–99)

## 2012-12-12 LAB — CBC
MCH: 29 pg (ref 26.0–34.0)
MCHC: 32.3 g/dL (ref 30.0–36.0)
Platelets: 183 10*3/uL (ref 150–400)
RDW: 14.6 % (ref 11.5–15.5)

## 2012-12-12 LAB — SEDIMENTATION RATE: Sed Rate: 40 mm/hr — ABNORMAL HIGH (ref 0–16)

## 2012-12-12 MED ORDER — WARFARIN SODIUM 6 MG PO TABS
6.0000 mg | ORAL_TABLET | Freq: Once | ORAL | Status: AC
  Administered 2012-12-12: 6 mg via ORAL
  Filled 2012-12-12: qty 1

## 2012-12-12 MED ORDER — FUROSEMIDE 40 MG PO TABS
40.0000 mg | ORAL_TABLET | Freq: Every day | ORAL | Status: DC
  Administered 2012-12-13 – 2012-12-14 (×2): 40 mg via ORAL
  Filled 2012-12-12 (×2): qty 1

## 2012-12-12 MED ORDER — HEPARIN (PORCINE) IN NACL 100-0.45 UNIT/ML-% IJ SOLN
2000.0000 [IU]/h | INTRAMUSCULAR | Status: DC
  Administered 2012-12-12: 1500 [IU]/h via INTRAVENOUS
  Administered 2012-12-13: 2000 [IU]/h via INTRAVENOUS
  Filled 2012-12-12 (×4): qty 250

## 2012-12-12 NOTE — Progress Notes (Addendum)
ANTICOAGULATION CONSULT NOTE - Follow-up  Pharmacy Consult for coumadin Indication: atrial fibrillation  Allergies  Allergen Reactions  . Methimazole (Thiamazole) Itching and Rash    Severe rash    Patient Measurements: Height: 5\' 5"  (165.1 cm) Weight: 255 lb 11.7 oz (116 kg) IBW/kg (Calculated) : 61.5  Weight: 123.4 kg  Vital Signs: Temp: 98.8 F (37.1 C) (07/07 0806) Temp src: Oral (07/07 0806) BP: 147/70 mmHg (07/07 0806) Pulse Rate: 70 (07/07 0806)  Labs:  Recent Labs  12/10/12 0625 12/11/12 0520 12/12/12 0403  HGB  --   --  11.7*  HCT  --   --  36.2*  PLT  --   --  183  LABPROT 32.5* 22.7* 18.4*  INR 3.32* 2.08* 1.58*  CREATININE 1.35 1.13 1.24    Estimated Creatinine Clearance: 67.2 ml/min (by C-G formula based on Cr of 1.24).  Assessment: 67 y.o. M on warfarin PTA for hx Afib. Doses were held 7/3 and 7/4 due to an elevated INR. Warfarin was resumed on 7/5 and INR this morning is subtherapeutic and continues to decline (INR 1.58<<2.08 << 3.32, goal of 2-3). CBC stable today, no overt s/sx of bleeding noted. The patient was re-educated on warfarin this admission.   Goal of Therapy:  INR 2-3   Plan:  1. Warfarin 6 mg x 1 dose at 1800 today 2. Will continue to monitor for any signs/symptoms of bleeding and will follow up with PT/INR in the a.m.   Estella Husk, Pharm.D., BCPS, AAHIVP Clinical Pharmacist Phone: [phone] or [phone] Pager: [phone] 12/12/2012, 10:09 AM    Addendum Adding heparin bridge this morning  Plan: 1) Begin heparin at 1500 units / hr (no bolus for now) 2) Heparin drip at 1500 units / hr 3) 8 hr. Heparin level 4) Daily heparin level, CBC  Thank you. Okey Regal, PharmD

## 2012-12-12 NOTE — Progress Notes (Signed)
SUBJECTIVE:  No complaints  OBJECTIVE:   Vitals:   Filed Vitals:   12/12/12 0254 12/12/12 0500 12/12/12 0806 12/12/12 1036  BP:  146/65 147/70   Pulse:  71 70   Temp:  98.5 F (36.9 C) 98.8 F (37.1 C)   TempSrc:  Oral Oral   Resp:  27 23   Height:      Weight:  116 kg (255 lb 11.7 oz)    SpO2: 96% 96% 96% 96%   I&O's:   Intake/Output Summary (Last 24 hours) at 12/12/12 1132 Last data filed at 12/12/12 0900  Gross per 24 hour  Intake    960 ml  Output   1675 ml  Net   -715 ml   TELEMETRY: Reviewed telemetry pt in a paced     PHYSICAL EXAM General: Well developed, well nourished, in no acute distress Head: Eyes PERRLA, No xanthomas.   Normal cephalic and atramatic  Lungs:   Clear bilaterally to auscultation and percussion. Heart:   HRRR S1 S2 Pulses are 2+ & equal. Abdomen: Bowel sounds are positive, abdomen soft and non-tender without masses  Extremities:   No clubbing, cyanosis or edema.  DP +1 Neuro: Alert and oriented X 3. Psych:  Good affect, responds appropriately   LABS: Basic Metabolic Panel:  Recent Labs  16/10/96 0625 12/11/12 0520 12/12/12 0403  NA 136 137 138  K 3.9 4.0 3.9  CL 95* 99 98  CO2 32 36* 35*  GLUCOSE 122* 108* 105*  BUN 35* 31* 31*  CREATININE 1.35 1.13 1.24  CALCIUM 9.1 9.3 8.8  MG 2.4  --   --   PHOS 2.6  --   --    Liver Function Tests: No results found for this basename: AST, ALT, ALKPHOS, BILITOT, PROT, ALBUMIN,  in the last 72 hours No results found for this basename: LIPASE, AMYLASE,  in the last 72 hours CBC:  Recent Labs  12/12/12 0403  WBC 9.3  HGB 11.7*  HCT 36.2*  MCV 89.8  PLT 183   Coag Panel:   Lab Results  Component Value Date   INR 1.58* 12/12/2012   INR 2.08* 12/11/2012   INR 3.32* 12/10/2012    RADIOLOGY: Dg Chest 2 View  12/12/2012   *RADIOLOGY REPORT*  Clinical Data: Dyspnea.  CHEST - 2 VIEW  Comparison: 12/11/2012  Findings: AICD unchanged.  Cardiomegaly noted with indistinct pulmonary  vasculature, interstitial accentuation, and mild but increased perihilar airspace opacity.  Blunted right costophrenic angle.  Suspected small layering pleural effusions on the lateral projection.  IMPRESSION:  1.  Mildly worsened congestive heart failure with small bilateral pleural effusions.   Original Report Authenticated By: Gaylyn Rong, M.D.   Dg Chest 2 View  12/11/2012   *RADIOLOGY REPORT*  Clinical Data: Follow-up congestive heart failure.  CHEST - 2 VIEW  Comparison: 12/10/2012  Findings: Stable appearance of cardiac pacemaker since previous study.  Mild cardiac enlargement with mild pulmonary vascular congestion is seen previously.  Mild perihilar infiltration suggesting edema.  Possible small bilateral pleural effusions.  No focal consolidation.  No pneumothorax.  Tortuous aorta.  Azygos lobe.  No significant change since previous study.  IMPRESSION: Mild cardiac enlargement and pulmonary vascular congestion with perihilar infiltrates and small pleural effusions.  No significant progression.   Original Report Authenticated By: Burman Nieves, M.D.   Ct Head Wo Contrast  12/08/2012   *RADIOLOGY REPORT*  Clinical Data: Somnolence, exclude intracranial bleed.  CT HEAD WITHOUT CONTRAST  Technique:  Contiguous  axial images were obtained from the base of the skull through the vertex without contrast.  Comparison: None.  Findings: No skull fracture is noted.  Paranasal sinuses and mastoid air cells are unremarkable.  No intracranial hemorrhage, mass effect or midline shift.  No mass lesion is noted on this unenhanced scan.  Paranasal sinuses and mastoid air cells are unremarkable.  Mild cerebral atrophy.  No acute infarction.  No mass lesion is noted on this unenhanced scan.  IMPRESSION: No acute intracranial abnormality.  Mild cerebral atrophy.   Original Report Authenticated By: Natasha Mead, M.D.   Dg Chest Port 1 View  12/10/2012   *RADIOLOGY REPORT*  Clinical Data: Assess edema  PORTABLE CHEST -  1 VIEW  Comparison: 12/09/2012  Findings: There is a left chest wall ICD with leads in the right atrial appendage and right ventricle.  Mild cardiac enlargement is stable.  Bilateral, basilar opacities are unchanged from previous exam.  IMPRESSION:  1.  No change in bibasilar airspace opacities.   Original Report Authenticated By: Signa Kell, M.D.   Dg Chest Port 1 View  12/09/2012   *RADIOLOGY REPORT*  Clinical Data: Respiratory failure  PORTABLE CHEST - 1 VIEW  Comparison: Prior chest x-ray 12/08/2012  Findings: Stable position of left subclavian approach cardiac rhythm maintenance device with leads projecting over the right atrium and right ventricle.  Stable cardiomegaly.  Probable bilateral layering effusions and associated bibasilar opacities. The degree of pulmonary vascular congestion and edema has improved. No pneumothorax.  Azygos fissure in the right upper lobe.  IMPRESSION:  1.  Improving pulmonary edema. 2.  Persistent bibasilar opacities likely reflecting a combination of layering pleural effusions with atelectasis and / or infiltrate. 3.  Stable cardiomegaly   Original Report Authenticated By: Malachy Moan, M.D.   Dg Chest Port 1 View  12/08/2012   *RADIOLOGY REPORT*  Clinical Data: Shortness of breath.  Evaluate for airspace disease.  PORTABLE CHEST - 1 VIEW  Comparison: 12/07/2012.  Findings: Stable moderate cardiac silhouette enlargement is evident.  Multiple lead AICD is in place with controller device on the left. Ectasia and nonaneurysmal calcification of the thoracic aorta are seen.  Mediastinal and hilar contours appear stable. There is vascular congestion pattern.  Haziness and patchy pulmonary infiltrative densities are seen in the perihilar region and in both lung bases.  This could reflect edema and / or pneumonia.  Indistinctness of costophrenic angles may reflect atelectasis and pleural effusions.  Osteophytes are present in the spine.  IMPRESSION: Stable cardiac silhouette  enlargement.  AICD in place. There is vascular congestion pattern.  Haziness and patchy pulmonary infiltrative densities are seen in the perihilar region and in both lung bases.  This could reflect edema and / or pneumonia. Indistinctness of costophrenic angles may reflect atelectasis and pleural effusions.   Original Report Authenticated By: Onalee Hua Call   Dg Chest Port 1 View  12/07/2012   *RADIOLOGY REPORT*  Clinical Data: Shortness of breath, weakness  PORTABLE CHEST - 1 VIEW  Comparison: Chest x-ray of 07/14/2012  Findings: Moderate cardiomegaly is again noted with defibrillator leads.  There is perihilar haziness most consistent with mild edema.  A small right effusion cannot be excluded.  IMPRESSION: Cardiomegaly and mild edema.   Original Report Authenticated By: Dwyane Dee, M.D.   Assessment/Plan:  1. Obesity hypoventilaltion syndrome  2. Atrial fibrillation in normal sinus rhythm s/p DCCV on PO amio 3. Acute diastolic CHF clinically better. BNP improved 4. Warfarin anticoagulation INR subtherapeutic - will place  on IV Heparin gtt per pharmacy since patient had DCCV a week ago and continue until INR > 2 on Warfarin 5.  HTN 6.  Idiopathic DCM with [facility] s/p AICD 7.  CAD    Quintella Reichert, MD  12/12/2012  11:32 AM

## 2012-12-12 NOTE — Consult Note (Signed)
Physical Medicine and Rehabilitation Consult  Reason for Consult: Deconditioning Referring Physician: Dr. Sung Amabile   HPI: Cody Faulkner is a 67 y.o. male with history of CAD, chronic CHF, OSA, A Fib with cardioversion 12/06/12. He was admitted on 12/07/12 with increased SOB, sleepiness and dull chest pain. Found to have hypercarbic respiratory failure. He was placed on BIPAP for acute respiratory decompensation of due to pulmonary edema. Respiratory status improved and patient off BIPAP currently. PT evaluation done and CIR recommended due to deconditioned state.   Patient states he lives alone but has family that may be able to assist him. He managed the urinal at bedside Review of Systems  HENT: Positive for hearing loss.   Eyes: Positive for blurred vision (bilateral cataracts. ).  Respiratory: Negative for shortness of breath and wheezing.   Cardiovascular: Positive for leg swelling. Negative for chest pain and palpitations.  Gastrointestinal: Negative for heartburn and nausea.  Genitourinary: Negative for frequency.  Musculoskeletal: Positive for myalgias.  Neurological: Negative for headaches.   Past Medical History  Diagnosis Date  . Atrial fibrillation     Cardioversion 2014  . Hypertension   . OSA (obstructive sleep apnea)        . Depression   . CAD (coronary artery disease)     predominantly single vessel  . Ventricular tachycardia     s/p defibrillator-Medtronic EnTrust [id number]  . Dilated cardiomyopathy     s/p defibrillator Medtronic EnTrust [phone]  . Asthma   . Implantable Defibrillator     Medtronic Entrust  . Hypothyroidism   . Solitary kidney 06/05/2006  . Gout   . Pneumonia, community acquired     bilateral bibasilar   . Hypothyroidism   . History of hyperthyroidism   . Left knee DJD     needs surgery   Past Surgical History  Procedure Laterality Date  . Cardiac defibrillator placement  2007    Medtronic EnTrust [phone]  . Nephrectomy  1976    Right  .  Cardiac catheterization  2007    60% Stenosis mid-CFX  . Knee arthroscopy      left  . Cardioversion  06/18/2011    Procedure: CARDIOVERSION;  Surgeon: Darden Palmer., MD;  Location: [facility] OR;  Service: Cardiovascular;  Laterality: N/A;  . Tee without cardioversion  07/13/2012    Procedure: TRANSESOPHAGEAL ECHOCARDIOGRAM (TEE);  Surgeon: Lewayne Bunting, MD;  Location: [facility] ENDOSCOPY;  Service: Cardiovascular;  Laterality: N/A;  . Cardioversion N/A 12/06/2012    Procedure: CARDIOVERSION;  Surgeon: Othella Boyer, MD;  Location: [facility] ENDOSCOPY;  Service: Cardiovascular;  Laterality: N/A;   Family History  Problem Relation Age of Onset  . Heart disease Other     uncle  . Diabetes Other     uncle   Social History: Single. Lives alone. Sedentary due to "bad left Knee". 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  Has cousins who check in on him intermittently.  He  reports that he has never smoked. He has never used smokeless tobacco. He reports that  drinks alcohol. He reports that he does not use illicit drugs.   Allergies  Allergen Reactions  . Methimazole (Thiamazole) Itching and Rash    Severe rash   Medications Prior to Admission  Medication Sig Dispense Refill  . acetaminophen (TYLENOL) 500 MG tablet Take 500 mg by mouth every 6 (six) hours as needed.       Marland Kitchen albuterol (PROVENTIL HFA;VENTOLIN HFA) 108 (90 BASE) MCG/ACT  inhaler Inhale 2 puffs into the lungs every 4 (four) hours as needed. For shortness of breath       . allopurinol (ZYLOPRIM) 100 MG tablet Take 100 mg by mouth daily.      Marland Kitchen amiodarone (PACERONE) 200 MG tablet Take 200 mg by mouth 2 (two) times daily.      Marland Kitchen aspirin 81 MG chewable tablet Chew 81 mg by mouth daily.      . bisoprolol (ZEBETA) 10 MG tablet Take 10 mg by mouth daily.      . budesonide-formoterol (SYMBICORT) 160-4.5 MCG/ACT inhaler Inhale 2 puffs into the lungs 2 (two) times daily.      . cetaphil (CETAPHIL) cream Apply 1 application  topically as needed.       . doxycycline (ADOXA) 100 MG tablet Take 100 mg by mouth daily.      . fenofibrate micronized (LOFIBRA) 134 MG capsule Take 134 mg by mouth daily before breakfast.       . furosemide (LASIX) 40 MG tablet Take 40 mg by mouth daily.       . hydrALAZINE (APRESOLINE) 25 MG tablet Take 25 mg by mouth 2 (two) times daily.       . isosorbide mononitrate (IMDUR) 60 MG 24 hr tablet Take 60 mg by mouth daily.      Marland Kitchen levothyroxine (SYNTHROID, LEVOTHROID) 100 MCG tablet Take 100 mcg by mouth daily.      Marland Kitchen loratadine (CLARITIN) 10 MG tablet Take 10 mg by mouth every morning.       . potassium chloride SA (K-DUR,KLOR-CON) 20 MEQ tablet Take 20 mEq by mouth every other day.      . warfarin (COUMADIN) 5 MG tablet Take 5-7.5 mg by mouth daily. Monday, Wednesday, and Friday, take 7.5 mg (one and a half tablets) Tuesday, Thursday, Saturday, Sunday, take 5 mg (one tablet)        Home: Home Living Family/patient expects to be discharged to:: Private residence Living Arrangements: Alone Available Help at Discharge: Family;Available PRN/intermittently Type of Home: Mobile home Home Access: Stairs to enter Entrance Stairs-Number of Steps: 3-5 Entrance Stairs-Rails: Right;Left;Can reach both Home Layout: One level Home Equipment: Walker - 2 wheels  Functional History: Prior Function Comments: Apparently eatsout a lot with his cousin, who drives Functional Status:  Mobility: Bed Mobility Bed Mobility: Sit to Supine Supine to Sit: 3: Mod assist;With rails;HOB elevated Sitting - Scoot to Edge of Bed: 4: Min assist;With rail Sit to Supine: 2: Max assist;HOB flat Transfers Transfers: Sit to Stand;Stand to [employer] Transfers Sit to Stand: 2: Max assist;With upper extremity assist;From bed Stand to Sit: 1: +2 Total assist;With upper extremity assist;With armrests;To chair/3-in-1 Stand to Sit: Patient Percentage: 50% Stand Pivot Transfers: 1: +2 Total assist Stand Pivot  Transfers: Patient Percentage: 50% Ambulation/Gait Ambulation/Gait Assistance: Not tested (comment) (pt unable to tolerate at this time)    ADL:    Cognition: Cognition Overall Cognitive Status: Within Functional Limits for tasks assessed Orientation Level: Oriented X4 Cognition Arousal/Alertness: Awake/alert Behavior During Therapy: WFL for tasks assessed/performed Overall Cognitive Status: Within Functional Limits for tasks assessed  Blood pressure 120/82, pulse 74, temperature 98.5 F (36.9 C), temperature source Oral, resp. rate 20, height 5\' 5"  (1.651 m), weight 116 kg (255 lb 11.7 oz), SpO2 95.00%. Physical Exam  Nursing note and vitals reviewed. Constitutional: He is oriented to person, place, and time. He appears well-developed and well-nourished. He is sleeping. He is easily aroused.  HENT:  Head: Normocephalic and  atraumatic.  Eyes: Pupils are equal, round, and reactive to light.  Neck: Normal range of motion.  Cardiovascular: Normal rate.   Pulmonary/Chest: Effort normal. No respiratory distress. He has no decreased breath sounds. He has no wheezes.  Abdominal: Bowel sounds are normal. He exhibits no distension. There is no tenderness.  Protuberant   Musculoskeletal: He exhibits no edema.  Left knee pain with attempts at ROM. Stasis changes bilateral shins.   Neurological: He is oriented to person, place, and time and easily aroused.  Fatigued appearing. Follows commands without difficulty.   Skin: Skin is warm and dry.   5/5 strength in bilateral deltoid, bicep, tricep, grip 4/5 in the right hip flexor knee extensor ankle dorsiflexor plantar flexor For/5 knee left hip flexor 3 minus knee flexor 4 minus knee extensor for ankle dorsiflexor plantar flexor on left Sensory intact to light touch in the upper and lower limbs  Results for orders placed during the hospital encounter of 12/07/12 (from the past 24 hour(s))  PROTIME-INR     Status: Abnormal   Collection Time     12/12/12  4:03 AM      Result Value Range   Prothrombin Time 18.4 (*) 11.6 - 15.2 seconds   INR 1.58 (*) 0.00 - 1.49  BASIC METABOLIC PANEL     Status: Abnormal   Collection Time    12/12/12  4:03 AM      Result Value Range   Sodium 138  135 - 145 mEq/L   Potassium 3.9  3.5 - 5.1 mEq/L   Chloride 98  96 - 112 mEq/L   CO2 35 (*) 19 - 32 mEq/L   Glucose, Bld 105 (*) 70 - 99 mg/dL   BUN 31 (*) 6 - 23 mg/dL   Creatinine, Ser 1.61  0.50 - 1.35 mg/dL   Calcium 8.8  8.4 - 09.6 mg/dL   GFR calc non Af Amer 58 (*) >90 mL/min   GFR calc Af Amer 67 (*) >90 mL/min  CBC     Status: Abnormal   Collection Time    12/12/12  4:03 AM      Result Value Range   WBC 9.3  4.0 - 10.5 K/uL   RBC 4.03 (*) 4.22 - 5.81 MIL/uL   Hemoglobin 11.7 (*) 13.0 - 17.0 g/dL   HCT 04.5 (*) 40.9 - 81.1 %   MCV 89.8  78.0 - 100.0 fL   MCH 29.0  26.0 - 34.0 pg   MCHC 32.3  30.0 - 36.0 g/dL   RDW 91.4  78.2 - 95.6 %   Platelets 183  150 - 400 K/uL  SEDIMENTATION RATE     Status: Abnormal   Collection Time    12/12/12  4:03 AM      Result Value Range   Sed Rate 40 (*) 0 - 16 mm/hr  PRO B NATRIURETIC PEPTIDE     Status: Abnormal   Collection Time    12/12/12  4:03 AM      Result Value Range   Pro B Natriuretic peptide (BNP) 209.3 (*) 0 - 125 pg/mL   Dg Chest 2 View  12/12/2012   *RADIOLOGY REPORT*  Clinical Data: Dyspnea.  CHEST - 2 VIEW  Comparison: 12/11/2012  Findings: AICD unchanged.  Cardiomegaly noted with indistinct pulmonary vasculature, interstitial accentuation, and mild but increased perihilar airspace opacity.  Blunted right costophrenic angle.  Suspected small layering pleural effusions on the lateral projection.  IMPRESSION:  1.  Mildly worsened congestive heart failure  with small bilateral pleural effusions.   Original Report Authenticated By: Gaylyn Rong, M.D.   Dg Chest 2 View  12/11/2012   *RADIOLOGY REPORT*  Clinical Data: Follow-up congestive heart failure.  CHEST - 2 VIEW  Comparison:  12/10/2012  Findings: Stable appearance of cardiac pacemaker since previous study.  Mild cardiac enlargement with mild pulmonary vascular congestion is seen previously.  Mild perihilar infiltration suggesting edema.  Possible small bilateral pleural effusions.  No focal consolidation.  No pneumothorax.  Tortuous aorta.  Azygos lobe.  No significant change since previous study.  IMPRESSION: Mild cardiac enlargement and pulmonary vascular congestion with perihilar infiltrates and small pleural effusions.  No significant progression.   Original Report Authenticated By: Burman Nieves, M.D.    Assessment/Plan: Diagnosis: Deconditioning secondary to exacerbation of congestive heart failure 1. Does the need for close, 24 hr/day medical supervision in concert with the patient's rehab needs make it unreasonable for this patient to be served in a less intensive setting? Potentially 2. Co-Morbidities requiring supervision/potential complications: Atrial fibrillation, cardiomyopathy, chronic respiratory failure, morbid obesity, chronic kidney disease 3. Due to bowel management, safety, skin/wound care, disease management, medication administration and patient education, does the patient require 24 hr/day rehab nursing? Yes 4. Does the patient require coordinated care of a physician, rehab nurse, PT (1-2 hrs/day, 5 days/week) and OT (1-2 hrs/day, 5 days/week) to address physical and functional deficits in the context of the above medical diagnosis(es)? Yes Addressing deficits in the following areas: balance, endurance, locomotion, strength, transferring, bowel/bladder control, bathing, dressing, feeding, grooming and toileting 5. Can the patient actively participate in an intensive therapy program of at least 3 hrs of therapy per day at least 5 days per week? Potentially 6. The potential for patient to make measurable gains while on inpatient rehab is fair 7. Anticipated functional outcomes upon discharge from  inpatient rehab are supervision to min assist with PT, supervision to min assist with OT, not applicable with SLP. 8. Estimated rehab length of stay to reach the above functional goals is: 2-3 weeks 9. Does the patient have adequate social supports to accommodate these discharge functional goals? Potentially 10. Anticipated D/C setting: Home 11. Anticipated post D/C treatments: HH therapy 12. Overall Rehab/Functional Prognosis: fair  RECOMMENDATIONS: This patient's condition is appropriate for continued rehabilitative care in the following setting: CIR Patient has agreed to participate in recommended program. Potentially Note that insurance prior authorization may be required for reimbursement for recommended care.  Comment: May not have good family support. Should be ready later this week. Endurance appears to be quite poor    12/12/2012

## 2012-12-12 NOTE — Progress Notes (Signed)
Physical Therapy Treatment Patient Details Name: Cody Faulkner MRN: [id number] DOB: February 17, 1946 Today's Date: 12/12/2012 Time: 1150-1203 PT Time Calculation (min): 13 min  PT Assessment / Plan / Recommendation  PT Comments   Pt returned to bed this session per request of RN of PT assist. Pt left in bed with RN.  Follow Up Recommendations  CIR     Does the patient have the potential to tolerate intense rehabilitation     Barriers to Discharge        Equipment Recommendations       Recommendations for Other Services    Frequency Min 3X/week   Progress towards PT Goals Progress towards PT goals: Progressing toward goals  Plan Discharge plan needs to be updated    Precautions / Restrictions Precautions Precautions: Fall Restrictions Weight Bearing Restrictions: No   Pertinent Vitals/Pain Denies pain    Mobility  Bed Mobility Bed Mobility: Sit to Supine Supine to Sit: 3: Mod assist;With rails;HOB elevated Sitting - Scoot to Edge of Bed: 4: Min assist;With rail Sit to Supine: 2: Max assist;HOB flat Details for Bed Mobility Assistance: maxA for LE management Transfers Transfers: Sit to Stand;Stand to Sit;Stand Pivot Transfers Sit to Stand: 2: Max assist;With upper extremity assist;From bed Stand to Sit: 1: +2 Total assist;With upper extremity assist;With armrests;To chair/3-in-1 Stand to Sit: Patient Percentage: 50% Stand Pivot Transfers: 1: +2 Total assist Stand Pivot Transfers: Patient Percentage: 50% Details for Transfer Assistance: attempted without RW however pt unable to complete transfer Ambulation/Gait Ambulation/Gait Assistance: Not tested (comment) (pt unable to tolerate at this time)    Exercises     PT Diagnosis:    PT Problem List:   PT Treatment Interventions:     PT Goals (current goals can now be found in the care plan section)    Visit Information  Last PT Received On: 12/12/12 Assistance Needed: +2 History of Present Illness: BRIEF PATIENT  DESCRIPTION: 46 yowm with OSA / OHS (untreated) who presented to [facility] ER with increased SOB, sleepiness and dull chest pain.  Found to have hypercarbic respiratory failure and PCCM called for ICU admit. Of note, patient underwent cardioversion on 7/1 per Dr. Donnie Aho.  Supratherapeutic INR on presentation.      Subjective Data  Subjective: RN requested PT assist to return pt back to bed due to pt being transfered to 2006.   Cognition  Cognition Arousal/Alertness: Awake/alert Behavior During Therapy: WFL for tasks assessed/performed Overall Cognitive Status: Within Functional Limits for tasks assessed    Balance     End of Session PT - End of Session Equipment Utilized During Treatment: Gait belt;Oxygen Activity Tolerance: Patient limited by fatigue Patient left: in chair;with call bell/phone within reach Nurse Communication: Mobility status   GP     Marcene Brawn 12/12/2012, 1:27 PM  Lewis Shock, PT, DPT Pager #: [phone] Office #: [phone]

## 2012-12-12 NOTE — Progress Notes (Signed)
Rehab Admissions Coordinator Note:  Patient was screened by Brock Ra for appropriateness for an Inpatient Acute Rehab Consult.  PT recommends CIR.  At this time, we are recommending Inpatient Rehab consult.  Brock Ra 12/12/2012, 8:59 AM  I can be reached at [phone].

## 2012-12-12 NOTE — Progress Notes (Signed)
PULMONARY  / CRITICAL CARE MEDICINE  Name: RUBENS CRANSTON MRN: [id number] DOB: May 15, 1946    ADMISSION DATE:  12/07/2012  REFERRING MD :  Dr. Rubin Payor PRIMARY SERVICE:  PCCM  CHIEF COMPLAINT:  Acute Respiratory Failure  BRIEF PATIENT DESCRIPTION: 61 yowm with OSA / OHS (untreated) who presented to [facility] ER with increased SOB, sleepiness and dull chest pain.  Found to have hypercarbic respiratory failure and PCCM called for ICU admit. Of note, patient underwent cardioversion on 7/1 per Dr. Donnie Aho.  Supratherapeutic INR on presentation.   SIGNIFICANT EVENTS / STUDIES:  7/01 - cardioversion per Dr. Donnie Aho.  Received 70 mg propofol for procedure with successful cardioversion.   .............................................................................................................................. 7/02 - admitted with decompensated OHS 7/3 bipap required 7/4 slept off bipap and remained off thereafter 7/06 PT/OT - rec CIR eval   SUBJECTIVE:  No new complaints. No distress.   VITAL SIGNS: Temp:  [98.5 F (36.9 C)-98.8 F (37.1 C)] 98.6 F (37 C) (07/07 1204) Pulse Rate:  [66-71] 71 (07/07 1204) Resp:  [19-29] 26 (07/07 1204) BP: (108-147)/(59-70) 108/59 mmHg (07/07 1204) SpO2:  [94 %-98 %] 98 % (07/07 1204) FiO2 (%):  [0 %] 0 % (07/07 1036) Weight:  [116 kg (255 lb 11.7 oz)] 116 kg (255 lb 11.7 oz) (07/07 0500)   PHYSICAL EXAMINATION: General:  NAD Neuro:  Awake, follows commands HEENT:  Short thick neck, mm pink/moist Cardiovascular:  Paced rhythm, RRR s M Lungs: bibasilar rales, no wheezes Abdomen:  Obese, soft Ext: no edema, chronic stasis changes   Recent Labs Lab 12/10/12 0625 12/11/12 0520 12/12/12 0403  NA 136 137 138  K 3.9 4.0 3.9  CL 95* 99 98  CO2 32 36* 35*  BUN 35* 31* 31*  CREATININE 1.35 1.13 1.24  GLUCOSE 122* 108* 105*    Recent Labs Lab 12/08/12 0419 12/09/12 0530 12/12/12 0403  HGB 12.6* 12.1* 11.7*  HCT 38.4* 37.0* 36.2*  WBC 9.1  8.1 9.3  PLT 197 200 183   Dg Chest 2 View  12/12/2012   *RADIOLOGY REPORT*  Clinical Data: Dyspnea.  CHEST - 2 VIEW  Comparison: 12/11/2012  Findings: AICD unchanged.  Cardiomegaly noted with indistinct pulmonary vasculature, interstitial accentuation, and mild but increased perihilar airspace opacity.  Blunted right costophrenic angle.  Suspected small layering pleural effusions on the lateral projection.  IMPRESSION:  1.  Mildly worsened congestive heart failure with small bilateral pleural effusions.   Original Report Authenticated By: Gaylyn Rong, M.D.   Dg Chest 2 View  12/11/2012   *RADIOLOGY REPORT*  Clinical Data: Follow-up congestive heart failure.  CHEST - 2 VIEW  Comparison: 12/10/2012  Findings: Stable appearance of cardiac pacemaker since previous study.  Mild cardiac enlargement with mild pulmonary vascular congestion is seen previously.  Mild perihilar infiltration suggesting edema.  Possible small bilateral pleural effusions.  No focal consolidation.  No pneumothorax.  Tortuous aorta.  Azygos lobe.  No significant change since previous study.  IMPRESSION: Mild cardiac enlargement and pulmonary vascular congestion with perihilar infiltrates and small pleural effusions.  No significant progression.   Original Report Authenticated By: Burman Nieves, M.D.   ASSESSMENT / PLAN:  Acute on Chronic Respiratory Failure  OSA / OHS Bilateral pulmonary infiltrates - edema vs pneumonitis (on amiodarone)  ESR 40 mm/hr. BNP 620, then 209  Plan: - Cont diuresis to keep I/Os slightly neg to even - Consider trial of steroids and D/C of amiodarone - Change nebs to PRN only - Cont supplemental O2  Atrial Fibrillation - s/p successful cardioversion on 7/1, chronic coumadin HTN CAD s/p AICD Chronic Systolic CHF Chest Pain, resolved - no evidence of active ischemia  Plan: per Cards   Acute Renal Insufficiency / AKI, resolved    Coagulopathy - chronic coumadin  administration  Plan: - Continue coumadin per pharmacy    Hypothyroidism with nl tsh 12/08/12 Gout  Plan: - Hold allopurinol  - Continue synthroid same dose     Transfer to tele 7/07. CIR consult requested per PT recs.   Billy Fischer, MD ; [facility] [phone].  After 5:30 PM or weekends, call [phone]

## 2012-12-12 NOTE — Progress Notes (Signed)
Report given to 2000 RN. Pt transferred to 2006 via bed, 2L O2, IVFs infusing. VS stable at time of transfer. No family. Belongings at bedside. Meds in chart. No current questions or complaints. Ramell Wacha L

## 2012-12-12 NOTE — Care Management Note (Signed)
    Page 1 of 1   12/14/2012     4:02:55 PM   CARE MANAGEMENT NOTE 12/14/2012  Patient:  Cody Faulkner, Cody Faulkner   Account Number:  [id number]  Date Initiated:  12/12/2012  Documentation initiated by:  Josealberto Montalto  Subjective/Objective Assessment:   PT ADM ON 12/07/12 WITH HYPERCARBIC RESPIRATORY FAILURE, AFIB, CHF.  PTA, PT LIVES AT HOME ALONE.     Action/Plan:   CIR CONSULT IN PROGRESS.  WILL FOLLOW UP.  MAY NEED SNF AS BACKUP PLAN, DEPENDING ON FAMILY SUPPORT.   Anticipated DC Date:  12/15/2012   Anticipated DC Plan:  SKILLED NURSING FACILITY  In-house referral  Clinical Social Worker      DC Planning Services  CM consult      Choice offered to / List presented to:             Status of service:  Completed, signed off Medicare Important Message given?   (If response is "NO", the following Medicare IM given date fields will be blank) Date Medicare IM given:   Date Additional Medicare IM given:    Discharge Disposition:  SKILLED NURSING FACILITY  Per UR Regulation:  Reviewed for med. necessity/level of care/duration of stay  If discussed at Long Length of Stay Meetings, dates discussed:    Comments:  12/14/12 Jayquon Theiler,RN,BSN [phone] PT DISCHARGED TO SNF TODAY, PER CSW ARRANGEMENTS.  12/13/12 Jerri Glauser,RN,BSN [phone] PT LIVES ALONE, AND HAS LITTLE SUPPORT AT DC.  INSURANCE PAYOR UNLIKELY TO APPROVE CIR STAY.  CSW CONSULTED FOR SNF FOR REHAB AT DC.  WILL FOLLOW PROGRESS.

## 2012-12-12 NOTE — Progress Notes (Signed)
Attempted to call report x 1. 2000 RN to call [phone] RN when ready for report. Dorthey Depace L

## 2012-12-12 NOTE — Progress Notes (Signed)
Physical Therapy Treatment Patient Details Name: MICKIE KOZIKOWSKI MRN: [id number] DOB: Oct 29, 1945 Today's Date: 12/12/2012 Time: 1041-1101 PT Time Calculation (min): 20 min  PT Assessment / Plan / Recommendation  PT Comments   Pt con't to have weak L LE  Limiting ability to tolerate ambulation this date. Prior to admit pt was living alone and walking without assistive device. Pt not extremely debilitated requiring maxAx2 for OOb mobility at this time. If pt unable to go to CIR pt with require SNF as pt is unsafe to return home alone due to impaired strength, increased falls risk, and increased assist requires for safe mobility.   Follow Up Recommendations  CIR                 Frequency Min 3X/week   Progress towards PT Goals Progress towards PT goals: Progressing toward goals  Plan Discharge plan needs to be updated    Precautions / Restrictions Precautions Precautions: Fall Restrictions Weight Bearing Restrictions: No   Pertinent Vitals/Pain Pt denies pain    Mobility  Bed Mobility Bed Mobility: Supine to Sit Supine to Sit: 3: Mod assist;With rails;HOB elevated Sitting - Scoot to Edge of Bed: 4: Min assist;With rail Details for Bed Mobility Assistance: labored effort, cues for sequencing Transfers Transfers: Sit to Stand;Stand to Sit;Stand Pivot Transfers Sit to Stand: 2: Max assist;With upper extremity assist;From bed Stand to Sit: 1: +2 Total assist;With upper extremity assist;With armrests;To chair/3-in-1 Stand to Sit: Patient Percentage: 50% Stand Pivot Transfers: 1: +2 Total assist Stand Pivot Transfers: Patient Percentage: 50% Details for Transfer Assistance: max directional sequencing cues and cues to increase use of UEs due to painful left knee. Pt with limited ability to advance R LE due to inability to tolerate L LE WBing. Pt with decreased L LE strength.. pt requires maxAx2 to safely transition to chair due to unable to complete 3 steps to  chair Ambulation/Gait Ambulation/Gait Assistance: Not tested (comment) (pt unable to tolerate at this time)    Exercises     PT Diagnosis:    PT Problem List:   PT Treatment Interventions:     PT Goals (current goals can now be found in the care plan section)    Visit Information  Last PT Received On: 12/12/12 Assistance Needed: +2 History of Present Illness: BRIEF PATIENT DESCRIPTION: 74 yowm with OSA / OHS (untreated) who presented to [facility] ER with increased SOB, sleepiness and dull chest pain.  Found to have hypercarbic respiratory failure and PCCM called for ICU admit. Of note, patient underwent cardioversion on 7/1 per Dr. Donnie Aho.  Supratherapeutic INR on presentation.      Subjective Data  Subjective: Pt received sleeping bed. easily awakened   Cognition  Cognition Arousal/Alertness: Awake/alert Behavior During Therapy: WFL for tasks assessed/performed Overall Cognitive Status: Within Functional Limits for tasks assessed    Balance     End of Session PT - End of Session Equipment Utilized During Treatment: Gait belt;Oxygen Activity Tolerance: Patient limited by fatigue Patient left: in chair;with call bell/phone within reach Nurse Communication: Mobility status   GP     Marcene Brawn 12/12/2012, 12:14 PM  Lewis Shock, PT, DPT Pager #: [phone] Office #: [phone]

## 2012-12-13 LAB — CBC
HCT: 37.5 % — ABNORMAL LOW (ref 39.0–52.0)
Hemoglobin: 11.7 g/dL — ABNORMAL LOW (ref 13.0–17.0)
MCH: 28.4 pg (ref 26.0–34.0)
MCHC: 31.2 g/dL (ref 30.0–36.0)
MCV: 91 fL (ref 78.0–100.0)

## 2012-12-13 LAB — HEPARIN LEVEL (UNFRACTIONATED): Heparin Unfractionated: 0.14 IU/mL — ABNORMAL LOW (ref 0.30–0.70)

## 2012-12-13 LAB — PROTIME-INR: Prothrombin Time: 19.4 seconds — ABNORMAL HIGH (ref 11.6–15.2)

## 2012-12-13 MED ORDER — WARFARIN SODIUM 6 MG PO TABS
6.0000 mg | ORAL_TABLET | Freq: Once | ORAL | Status: AC
  Administered 2012-12-13: 6 mg via ORAL
  Filled 2012-12-13: qty 1

## 2012-12-13 MED ORDER — HEPARIN (PORCINE) IN NACL 100-0.45 UNIT/ML-% IJ SOLN
2500.0000 [IU]/h | INTRAMUSCULAR | Status: DC
  Administered 2012-12-13: 2400 [IU]/h via INTRAVENOUS
  Administered 2012-12-14 (×2): 2500 [IU]/h via INTRAVENOUS
  Filled 2012-12-13 (×5): qty 250

## 2012-12-13 NOTE — Progress Notes (Signed)
Physical Therapy Treatment Patient Details Name: Cody Faulkner MRN: [id number] DOB: 1945-10-18 Today's Date: 12/13/2012 Time: 4098-1191 PT Time Calculation (min): 12 min  PT Assessment / Plan / Recommendation  PT Comments   Pt able to tolerate use of sara plus for standing balance and transfers. Patient with some improvements in bed mobility compared to prior sessions. Will continue to see and progress activity as tolerated.  Follow Up Recommendations  CIR           Equipment Recommendations  3in1 (PT)    Recommendations for Other Services    Frequency Min 3X/week   Progress towards PT Goals Progress towards PT goals: Progressing toward goals  Plan Current plan remains appropriate    Precautions / Restrictions Precautions Precautions: Fall Precaution Comments: Watch O2 sats on Room Air Restrictions Weight Bearing Restrictions: No Other Position/Activity Restrictions: Very painful L knee with Wbing   Pertinent Vitals/Pain NAD    Mobility  Bed Mobility Bed Mobility: Sit to Supine;Scooting to HOB Sit to Supine: 3: Mod assist Details for Bed Mobility Assistance: maxA for LE management Transfers Sit to Stand: 1: +2 Total assist Sit to Stand: Patient Percentage:  (using Sara Plus) Stand to Sit: 1: +2 Total assist Stand to Sit: Patient Percentage:  (using Huntley Dec Plus) Stand Pivot Transfers: 1: +2 Total assist Stand Pivot Transfers: Patient Percentage:  (using Sara Plus) Transfer via Lift Equipment: Hydrographic surveyor Details for Transfer Assistance: Use of sara lift, pt able to maintain standing with lift for approx 2 minutes Ambulation/Gait Ambulation/Gait Assistance: Not tested (comment) (pt unable to tolerate at this time)      PT Goals (current goals can now be found in the care plan section) Acute Rehab PT Goals PT Goal Formulation: With patient Time For Goal Achievement: 12/25/12 Potential to Achieve Goals: Fair  Visit Information  Last PT Received On:  12/13/12 Assistance Needed: +2 History of Present Illness:  66 yowm with OSA / OHS (untreated) who presented to [facility] ER with increased SOB, sleepiness and dull chest pain.  Found to have hypercarbic respiratory failure and PCCM called for ICU admit. Of note, patient underwent cardioversion on 7/1 per Dr. Donnie Aho.  Supratherapeutic INR on presentation.      Subjective Data  Subjective: RN requested PT assist to return pt back to bed due to pt being transfered to 2006.   Cognition  Cognition Arousal/Alertness: Awake/alert Behavior During Therapy: WFL for tasks assessed/performed Overall Cognitive Status: Within Functional Limits for tasks assessed    Balance  Balance Balance Assessed: Yes Static Sitting Balance Static Sitting - Balance Support: Feet supported Static Sitting - Level of Assistance: 7: Independent Static Standing Balance Static Standing - Balance Support: Bilateral upper extremity supported (on Sara Plus) Static Standing - Level of Assistance: 5: Stand by assistance (and Huntley Dec Plus) Static Standing - Comment/# of Minutes: 2 minutes  End of Session PT - End of Session Equipment Utilized During Treatment: Oxygen;Other (comment) Huntley Dec Plus) Activity Tolerance: Patient tolerated treatment well Patient left: in bed;with call bell/phone within reach;with nursing/sitter in room Nurse Communication: Mobility status;Need for lift equipment   GP     Fabio Asa 12/13/2012, 3:55 PM Charlotte Crumb, PT DPT  [phone]

## 2012-12-13 NOTE — Progress Notes (Signed)
PULMONARY  / CRITICAL CARE MEDICINE  Name: Cody Faulkner MRN: [id number] DOB: 21-Jul-1945    ADMISSION DATE:  12/07/2012  REFERRING MD :  Dr. Rubin Payor PRIMARY SERVICE:  PCCM  CHIEF COMPLAINT:  Acute Respiratory Failure  BRIEF PATIENT DESCRIPTION: 22 yowm with OSA / OHS (untreated) who presented to [facility] ER with increased SOB, sleepiness and dull chest pain.  Found to have hypercarbic respiratory failure and PCCM called for ICU admit. Of note, patient underwent cardioversion on 7/1 per Dr. Donnie Aho.  Supratherapeutic INR on presentation.   SIGNIFICANT EVENTS / STUDIES:  7/01 - cardioversion per Dr. Donnie Aho.  Received 70 mg propofol for procedure with successful cardioversion.   .............................................................................................................................. 7/02 - admitted with decompensated OHS 7/3 bipap required 7/4 slept off bipap and remained off thereafter 7/06 PT/OT - rec CIR eval   SUBJECTIVE:  No new complaints. White phlegm, still coughing  VITAL SIGNS: Temp:  [98.1 F (36.7 C)-98.5 F (36.9 C)] 98.5 F (36.9 C) (07/08 0345) Pulse Rate:  [69-74] 69 (07/08 1030) Resp:  [20-22] 22 (07/08 0345) BP: (107-144)/(64-95) 123/77 mmHg (07/08 1030) SpO2:  [91 %-95 %] 92 % (07/08 0804) Weight:  [257 lb (116.574 kg)] 257 lb (116.574 kg) (07/08 0345)   PHYSICAL EXAMINATION: General:  NAD Neuro:  Awake, follows commands HEENT:  Short thick neck, mm pink/moist Cardiovascular:  Paced rhythm, RRR s M Lungs: bibasilar rales, no wheezes Abdomen:  Obese, soft Ext: no edema, chronic stasis changes   Recent Labs Lab 12/10/12 0625 12/11/12 0520 12/12/12 0403  NA 136 137 138  K 3.9 4.0 3.9  CL 95* 99 98  CO2 32 36* 35*  BUN 35* 31* 31*  CREATININE 1.35 1.13 1.24  GLUCOSE 122* 108* 105*    Recent Labs Lab 12/09/12 0530 12/12/12 0403 12/13/12 0530  HGB 12.1* 11.7* 11.7*  HCT 37.0* 36.2* 37.5*  WBC 8.1 9.3 7.4  PLT 200 183 201    Dg Chest 2 View  12/12/2012   *RADIOLOGY REPORT*  Clinical Data: Dyspnea.  CHEST - 2 VIEW  Comparison: 12/11/2012  Findings: AICD unchanged.  Cardiomegaly noted with indistinct pulmonary vasculature, interstitial accentuation, and mild but increased perihilar airspace opacity.  Blunted right costophrenic angle.  Suspected small layering pleural effusions on the lateral projection.  IMPRESSION:  1.  Mildly worsened congestive heart failure with small bilateral pleural effusions.   Original Report Authenticated By: Gaylyn Rong, M.D.   Dg Chest 2 View  12/11/2012   *RADIOLOGY REPORT*  Clinical Data: Follow-up congestive heart failure.  CHEST - 2 VIEW  Comparison: 12/10/2012  Findings: Stable appearance of cardiac pacemaker since previous study.  Mild cardiac enlargement with mild pulmonary vascular congestion is seen previously.  Mild perihilar infiltration suggesting edema.  Possible small bilateral pleural effusions.  No focal consolidation.  No pneumothorax.  Tortuous aorta.  Azygos lobe.  No significant change since previous study.  IMPRESSION: Mild cardiac enlargement and pulmonary vascular congestion with perihilar infiltrates and small pleural effusions.  No significant progression.   Original Report Authenticated By: Burman Nieves, M.D.   ASSESSMENT / PLAN:  Acute on Chronic Respiratory Failure  OSA / OHS Bilateral pulmonary infiltrates - edema vs pneumonitis (on amiodarone)  ESR 40 mm/hr. BNP 620, then 209  Plan: - Cont diuresis to keep I/Os slightly neg to even - Consider trial of steroids and D/C of amiodarone - Change nebs to PRN only - Cont supplemental O2    Atrial Fibrillation - s/p successful cardioversion on 7/1, chronic coumadin HTN  CAD s/p AICD Chronic Systolic CHF Chest Pain, resolved - no evidence of active ischemia  Plan: per Cards   Acute Renal Insufficiency / AKI, resolved    Coagulopathy - chronic coumadin administration  Plan: - Continue coumadin  per pharmacy    Hypothyroidism with nl tsh 12/08/12 Gout  Plan: - Hold allopurinol  - Continue synthroid same dose     Transferred to tele 7/07. Await CIR consult , plan for SNF   Cyril Mourning MD. FCCP. [facility] Pager [phone] If no response call [phone]

## 2012-12-13 NOTE — Progress Notes (Signed)
Occupational Therapy Evaluation Patient Details Name: Cody Faulkner MRN: [id number] DOB: 05-16-1946 Today's Date: 12/13/2012 Time: 4098-1191 OT Time Calculation (min): 28 min  OT Assessment / Plan / Recommendation History of present illness  42 yowm with OSA / OHS (untreated) who presented to [facility] ER with increased SOB, sleepiness and dull chest pain.  Found to have hypercarbic respiratory failure and PCCM called for ICU admit. Of note, patient underwent cardioversion on 7/1 per Dr. Donnie Aho.  Supratherapeutic INR on presentation.     Clinical Impression   Pt with significant functional decline as compared to baseline. PTA, pt was independent with all ADL and mobility for ADL and lived Psychologist, clinical. Pt currently requires mod - max A with ADL. Pt will benefit from skilled OT services to facilitate D/C to next venue due to below deficits.    OT Assessment  Patient needs continued OT Services    Follow Up Recommendations  SNF    Barriers to Discharge Decreased caregiver support    Equipment Recommendations  None recommended by OT    Recommendations for Other Services    Frequency  Min 2X/week    Precautions / Restrictions Precautions Precautions: Fall Precaution Comments: Watch O2 sats on Room Air Restrictions Weight Bearing Restrictions: No Other Position/Activity Restrictions: Very painful L knee with Wbing   Pertinent Vitals/Pain no apparent distress     ADL  Grooming: Set up Where Assessed - Grooming: Supported sitting Upper Body Bathing: Set up Where Assessed - Upper Body Bathing: Supported sitting Lower Body Bathing: Maximal assistance Where Assessed - Lower Body Bathing: Supported sit to stand Upper Body Dressing: Minimal assistance Where Assessed - Upper Body Dressing: Supported sitting Lower Body Dressing: Maximal assistance Where Assessed - Lower Body Dressing: Supported sit to Pharmacist, hospital: +2 Total assistance Toilet Transfer: Patient Percentage:  50% Transfers/Ambulation Related to ADLs: +2 for safety ADL Comments: lmited by deconditioning and pain. Significant funcitonal decline    OT Diagnosis: Generalized weakness  OT Problem List: Decreased strength;Decreased activity tolerance;Decreased knowledge of use of DME or AE;Decreased knowledge of precautions;Cardiopulmonary status limiting activity;Obesity OT Treatment Interventions: Self-care/ADL training;Therapeutic exercise;Therapeutic activities;Patient/family education;DME and/or AE instruction;Energy conservation   OT Goals(Current goals can be found in the care plan section) Acute Rehab OT Goals Patient Stated Goal: Really wants to go to cousin's wedding on July 12th OT Goal Formulation: With patient Time For Goal Achievement: 12/27/12 Potential to Achieve Goals: Good ADL Goals Pt Will Perform Grooming: with supervision;standing Pt Will Perform Lower Body Bathing: with min assist;sit to/from stand;with adaptive equipment Pt Will Transfer to Toilet: with min assist;ambulating;bedside commode Additional ADL Goal #1: independent with theraband strengthening program (level 1)  Visit Information  Last OT Received On: 12/13/12 Assistance Needed: +2 History of Present Illness:  66 yowm with OSA / OHS (untreated) who presented to [facility] ER with increased SOB, sleepiness and dull chest pain.  Found to have hypercarbic respiratory failure and PCCM called for ICU admit. Of note, patient underwent cardioversion on 7/1 per Dr. Donnie Aho.  Supratherapeutic INR on presentation.         Prior Functioning     Home Living Family/patient expects to be discharged to:: Private residence Living Arrangements: Alone Available Help at Discharge: Family;Available PRN/intermittently Type of Home: Mobile home Home Access: Stairs to enter Entrance Stairs-Number of Steps: 3-5 Entrance Stairs-Rails: Right;Left;Can reach both Home Layout: One level Home Equipment: Walker - 2 wheels Prior Function Level  of Independence: Independent with assistive device(s) Comments: Apparently eatsout a lot with  his cousin, who drives Communication Communication: HOH Marine scientist voice)         Vision/Perception     Cognition  Cognition Arousal/Alertness: Awake/alert Behavior During Therapy: WFL for tasks assessed/performed Overall Cognitive Status: Within Functional Limits for tasks assessed    Extremity/Trunk Assessment Upper Extremity Assessment Upper Extremity Assessment: Generalized weakness;LUE deficits/detail LUE Deficits / Details: should FF to @ 80. ? RTC dusfunction Lower Extremity Assessment LLE Deficits / Details: Increased pain with WBing L knee, which is limiting his weight acceptance into stance, and his ability to step RLR LLE: Unable to fully assess due to pain Cervical / Trunk Assessment Cervical / Trunk Assessment: Normal     Mobility Bed Mobility Bed Mobility: Not assessed Supine to Sit: 3: Mod assist;With rails;HOB elevated Sitting - Scoot to Edge of Bed: 4: Min assist;With rail Details for Bed Mobility Assistance: Assist to bring legs off bed and trunk up. Transfers Transfers: Sit to Stand;Stand to Sit Sit to Stand: 2: Max assist;From chair/3-in-1;With upper extremity assist Sit to Stand: Patient Percentage: 60% (using Huntley Dec Plus) Stand to Sit: 1: +2 Total assist Stand to Sit: Patient Percentage:  (using Newman Nip) Transfer via Lift Equipment: Hydrographic surveyor Details for Transfer Assistance: Able to complete sit - stand but has difficulty achieving full upright positionin     Exercise Other Exercises Other Exercises: general UB theraband strengthening exercises - level 1   Balance Balance Balance Assessed: Yes Static Standing Balance Static Standing - Balance Support: Bilateral upper extremity supported (on Sara Plus) Static Standing - Level of Assistance: 5: Stand by assistance (and Sara Plus) Static Standing - Comment/# of Minutes: Stood x 3 for 1-2 minutes each.   End  of Session OT - End of Session Activity Tolerance: Patient tolerated treatment well Patient left: in chair;with call bell/phone within reach Nurse Communication: Mobility status  GO     Cody Faulkner,Cody Faulkner 12/13/2012, 12:07 PM [facility], OTR/L  [phone] 12/13/2012

## 2012-12-13 NOTE — Progress Notes (Signed)
Met with patient to discuss plans for rehabilitation. Explained to pt that [facility] is not likely to approve CIR for his diagnosis and that they would not approve both CIR and SNF.  Pt will need 24/7 supervision - minimum assistance post d/c. Pt lives alone and reports that he does not have a caregiver available when he returns home. At this time there is no bed availability at CIR. In light of this, I recommended to pt that he consider SNF for his rehab needs. Explained to pt that his CSW would follow-up for d/c plan. Discussed above with pt's CSW. I can be reached at [phone].

## 2012-12-13 NOTE — Clinical Social Work Psychosocial (Signed)
     Clinical Social Work Department BRIEF PSYCHOSOCIAL ASSESSMENT 12/13/2012  Patient:  Cody Faulkner, Cody Faulkner     Account Number:  [id number]     Admit date:  12/07/2012  Clinical Social Worker:  Tiburcio Pea  Date/Time:  12/13/2012 05:22 PM  Referred by:  Care Management  Date Referred:  12/13/2012 Referred for  SNF Placement   Other Referral:   Interview type:  Other - See comment Other interview type:   Son and cousin    PSYCHOSOCIAL DATA Living Status:  ALONE Admitted from facility:   Level of care:   Primary support name:  Chris Cripps  (c) [phone] Primary support relationship to patient:  CHILD, ADULT Degree of support available:   Strong support    CURRENT CONCERNS Current Concerns  Post-Acute Placement   Other Concerns:    SOCIAL WORK ASSESSMENT / PLAN CSW met with patient, his son Minerva Areola and his cousin this afternoon to discuss PT's recommendation for CIR vs SNF. Patient has been assessed and denied by CIR; thus, SNF plan initiated. Patient is in agreement and states that he has been placed in the past at the [facility] and [address]. Patient would like to go to [facility] in [place] in possible.  CSW initiated referral and sent Blue Medicare auth request to Clydie Braun at [facility].  FL2 placed on chart for MD's signature.   Assessment/plan status:  Psychosocial Support/Ongoing Assessment of Needs Other assessment/ plan:   Information/referral to community resources:   SNF provided    PATIENTS/FAMILYS RESPONSE TO PLAN OF CARE: Patient is alert and oriented; he is agreeable to SNF placement but wants to defer all decisions to his son Minerva Areola. Son fully supports short term SNF and is hoping patient can go to [facility]. CSW will follow up tomorrow regarding possible bed offers and [employer] authorization.  [name]. Velma Hanna, LCSWA  [phone]

## 2012-12-13 NOTE — Progress Notes (Addendum)
SUBJECTIVE:  Still SHOB.  Chronic.  OBJECTIVE:   Vitals:   Filed Vitals:   12/13/12 0004 12/13/12 0345 12/13/12 0804 12/13/12 1030  BP:  144/95  123/77  Pulse:  71 71 69  Temp:  98.5 F (36.9 C)    TempSrc:  Oral    Resp:  22    Height:      Weight:  116.574 kg (257 lb)    SpO2: 92% 91% 92%    I&O's:    Intake/Output Summary (Last 24 hours) at 12/13/12 1102 Last data filed at 12/13/12 0700  Gross per 24 hour  Intake    375 ml  Output    525 ml  Net   -150 ml   TELEMETRY: Reviewed telemetry pt atrial paced     PHYSICAL EXAM General: Well developed, well nourished, Head:  Normal cephalic and atramatic  Lungs:   No wheezing. Heart:   HRRR S1 S2  Abdomen: obese Extremities: Tr bilateral LE edema.  Skin discolored bilaterally Neuro: Alert and oriented X 3. Psych:   Normal affect, responds appropriately   LABS: Basic Metabolic Panel:  Recent Labs  16/10/96 0520 12/12/12 0403  NA 137 138  K 4.0 3.9  CL 99 98  CO2 36* 35*  GLUCOSE 108* 105*  BUN 31* 31*  CREATININE 1.13 1.24  CALCIUM 9.3 8.8   Liver Function Tests: No results found for this basename: AST, ALT, ALKPHOS, BILITOT, PROT, ALBUMIN,  in the last 72 hours No results found for this basename: LIPASE, AMYLASE,  in the last 72 hours CBC:  Recent Labs  12/12/12 0403 12/13/12 0530  WBC 9.3 7.4  HGB 11.7* 11.7*  HCT 36.2* 37.5*  MCV 89.8 91.0  PLT 183 201   Coag Panel:   Lab Results  Component Value Date   INR 1.69* 12/13/2012   INR 1.58* 12/12/2012   INR 2.08* 12/11/2012    RADIOLOGY: Dg Chest 2 View  12/12/2012   *RADIOLOGY REPORT*  Clinical Data: Dyspnea.  CHEST - 2 VIEW  Comparison: 12/11/2012  Findings: AICD unchanged.  Cardiomegaly noted with indistinct pulmonary vasculature, interstitial accentuation, and mild but increased perihilar airspace opacity.  Blunted right costophrenic angle.  Suspected small layering pleural effusions on the lateral projection.  IMPRESSION:  1.  Mildly worsened  congestive heart failure with small bilateral pleural effusions.   Original Report Authenticated By: Gaylyn Rong, M.D.   Dg Chest 2 View  12/11/2012   *RADIOLOGY REPORT*  Clinical Data: Follow-up congestive heart failure.  CHEST - 2 VIEW  Comparison: 12/10/2012  Findings: Stable appearance of cardiac pacemaker since previous study.  Mild cardiac enlargement with mild pulmonary vascular congestion is seen previously.  Mild perihilar infiltration suggesting edema.  Possible small bilateral pleural effusions.  No focal consolidation.  No pneumothorax.  Tortuous aorta.  Azygos lobe.  No significant change since previous study.  IMPRESSION: Mild cardiac enlargement and pulmonary vascular congestion with perihilar infiltrates and small pleural effusions.  No significant progression.   Original Report Authenticated By: Burman Nieves, M.D.   Ct Head Wo Contrast  12/08/2012   *RADIOLOGY REPORT*  Clinical Data: Somnolence, exclude intracranial bleed.  CT HEAD WITHOUT CONTRAST  Technique:  Contiguous axial images were obtained from the base of the skull through the vertex without contrast.  Comparison: None.  Findings: No skull fracture is noted.  Paranasal sinuses and mastoid air cells are unremarkable.  No intracranial hemorrhage, mass effect or midline shift.  No mass lesion is noted on this  unenhanced scan.  Paranasal sinuses and mastoid air cells are unremarkable.  Mild cerebral atrophy.  No acute infarction.  No mass lesion is noted on this unenhanced scan.  IMPRESSION: No acute intracranial abnormality.  Mild cerebral atrophy.   Original Report Authenticated By: Natasha Mead, M.D.   Dg Chest Port 1 View  12/10/2012   *RADIOLOGY REPORT*  Clinical Data: Assess edema  PORTABLE CHEST - 1 VIEW  Comparison: 12/09/2012  Findings: There is a left chest wall ICD with leads in the right atrial appendage and right ventricle.  Mild cardiac enlargement is stable.  Bilateral, basilar opacities are unchanged from previous  exam.  IMPRESSION:  1.  No change in bibasilar airspace opacities.   Original Report Authenticated By: Signa Kell, M.D.   Dg Chest Port 1 View  12/09/2012   *RADIOLOGY REPORT*  Clinical Data: Respiratory failure  PORTABLE CHEST - 1 VIEW  Comparison: Prior chest x-ray 12/08/2012  Findings: Stable position of left subclavian approach cardiac rhythm maintenance device with leads projecting over the right atrium and right ventricle.  Stable cardiomegaly.  Probable bilateral layering effusions and associated bibasilar opacities. The degree of pulmonary vascular congestion and edema has improved. No pneumothorax.  Azygos fissure in the right upper lobe.  IMPRESSION:  1.  Improving pulmonary edema. 2.  Persistent bibasilar opacities likely reflecting a combination of layering pleural effusions with atelectasis and / or infiltrate. 3.  Stable cardiomegaly   Original Report Authenticated By: Malachy Moan, M.D.   Dg Chest Port 1 View  12/08/2012   *RADIOLOGY REPORT*  Clinical Data: Shortness of breath.  Evaluate for airspace disease.  PORTABLE CHEST - 1 VIEW  Comparison: 12/07/2012.  Findings: Stable moderate cardiac silhouette enlargement is evident.  Multiple lead AICD is in place with controller device on the left. Ectasia and nonaneurysmal calcification of the thoracic aorta are seen.  Mediastinal and hilar contours appear stable. There is vascular congestion pattern.  Haziness and patchy pulmonary infiltrative densities are seen in the perihilar region and in both lung bases.  This could reflect edema and / or pneumonia.  Indistinctness of costophrenic angles may reflect atelectasis and pleural effusions.  Osteophytes are present in the spine.  IMPRESSION: Stable cardiac silhouette enlargement.  AICD in place. There is vascular congestion pattern.  Haziness and patchy pulmonary infiltrative densities are seen in the perihilar region and in both lung bases.  This could reflect edema and / or pneumonia.  Indistinctness of costophrenic angles may reflect atelectasis and pleural effusions.   Original Report Authenticated By: Onalee Hua Call   Dg Chest Port 1 View  12/07/2012   *RADIOLOGY REPORT*  Clinical Data: Shortness of breath, weakness  PORTABLE CHEST - 1 VIEW  Comparison: Chest x-ray of 07/14/2012  Findings: Moderate cardiomegaly is again noted with defibrillator leads.  There is perihilar haziness most consistent with mild edema.  A small right effusion cannot be excluded.  IMPRESSION: Cardiomegaly and mild edema.   Original Report Authenticated By: Dwyane Dee, M.D.   Assessment/Plan:  1. Obesity hypoventilaltion syndrome  2. Atrial fibrillation in normal sinus rhythm s/p DCCV on PO amio 3. Acute diastolic CHF clinically better. BNP improved. COntinue to keep I<O 4. Warfarin anticoagulation INR subtherapeutic - continue IV Heparin gtt per pharmacy since patient had DCCV a week ago and continue until INR > 2 on Warfarin 5.  HTN 6.  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  CAD   Discussed with Dr. Vassie Loll.  There is a question whether his respiratory  issues are from long-term amiodarone therapy.  Dr. Donnie Aho will be back tomorrow and I will defer decision about antiarrhythmic therapy for him.  Could consider using a different antiarrhythmic but did not have long-term side effects.   Corky Crafts., MD  12/13/2012  11:02 AM

## 2012-12-13 NOTE — Progress Notes (Addendum)
ANTICOAGULATION CONSULT NOTE - Follow Up Consult  Pharmacy Consult for heparin bridge/ Coumadin Indication: atrial fibrillation  Allergies  Allergen Reactions  . Methimazole (Thiamazole) Itching and Rash    Severe rash    Patient Measurements: Height: 5\' 5"  (165.1 cm) Weight: 257 lb (116.574 kg) IBW/kg (Calculated) : 61.5 Heparin Dosing Weight:   Vital Signs: Temp: 98.5 F (36.9 C) (07/08 0345) Temp src: Oral (07/08 0345) BP: 144/95 mmHg (07/08 0345) Pulse Rate: 71 (07/08 0804)  Labs:  Recent Labs  12/11/12 0520 12/12/12 0403 12/12/12 2323 12/13/12 0530 12/13/12 0700  HGB  --  11.7*  --  11.7*  --   HCT  --  36.2*  --  37.5*  --   PLT  --  183  --  201  --   LABPROT 22.7* 18.4*  --  19.4*  --   INR 2.08* 1.58*  --  1.69*  --   HEPARINUNFRC  --   --  <0.10*  --  0.14*  CREATININE 1.13 1.24  --   --   --     Estimated Creatinine Clearance: 67.3 ml/min (by C-G formula based on Cr of 1.24).   Medications:  . amiodarone  200 mg Oral BID  . antiseptic oral rinse  15 mL Mouth Rinse BID  . aspirin  81 mg Oral Daily  . bisoprolol  10 mg Oral Daily  . furosemide  40 mg Oral Daily  . hydrALAZINE  25 mg Oral BID  . isosorbide mononitrate  60 mg Oral Daily  . levothyroxine  100 mcg Oral QAC breakfast  . potassium chloride  20 mEq Oral QODAY  . warfarin  6 mg Oral ONCE-1800  . Warfarin - Pharmacist Dosing Inpatient   Does not apply q1800   . heparin 2,000 Units/hr (12/13/12 0340)    Assessment: 67 yo male on chronic Coumadin for afib.  Doses were held 7/3 and 7/4 due to an elevated INR.  Warfarin was resumed on 7/5 and INR this morning remains slightly subtherapeutic, although starting to rise today.  Heparin gtt started last night as bridge to therapeutic INR.  Heparin level low this AM despite overnight rate increase, however, level was checked only about 3 hrs after rate was increased.  No bleeding or complications noted per chart notes, and CBC stable.  Goal of  Therapy:  Heparin level 0.3-0.7 units/ml INR 2-3 Monitor platelets by anticoagulation protocol: Yes   Plan:  1. Continue IV heparin at current rate.  Will recheck level at noon. 2. Coumadin 6 mg po x 1 tonight. 3. Continue daily CBC, Heparin level and INR. 4. Monitor for signs or symptoms of bleeding.  Tad Moore, BCPS  Clinical Pharmacist Pager [phone]  12/13/2012 2:37 PM

## 2012-12-13 NOTE — Progress Notes (Signed)
ANTICOAGULATION CONSULT NOTE - Follow Up Consult  Pharmacy Consult for heparin bridge/ Coumadin Indication: atrial fibrillation  Allergies  Allergen Reactions  . Methimazole (Thiamazole) Itching and Rash    Severe rash    Patient Measurements: Height: 5\' 5"  (165.1 cm) Weight: 257 lb (116.574 kg) IBW/kg (Calculated) : 61.5 Heparin Dosing Weight:   Vital Signs: Temp: 97.9 F (36.6 C) (07/08 1358) Temp src: Oral (07/08 1358) BP: 95/57 mmHg (07/08 1358) Pulse Rate: 72 (07/08 1358)  Labs:  Recent Labs  12/11/12 0520 12/12/12 0403 12/12/12 2323 12/13/12 0530 12/13/12 0700 12/13/12 1300  HGB  --  11.7*  --  11.7*  --   --   HCT  --  36.2*  --  37.5*  --   --   PLT  --  183  --  201  --   --   LABPROT 22.7* 18.4*  --  19.4*  --   --   INR 2.08* 1.58*  --  1.69*  --   --   HEPARINUNFRC  --   --  <0.10*  --  0.14* <0.10*  CREATININE 1.13 1.24  --   --   --   --     Estimated Creatinine Clearance: 67.3 ml/min (by C-G formula based on Cr of 1.24).   Medications:  . amiodarone  200 mg Oral BID  . antiseptic oral rinse  15 mL Mouth Rinse BID  . aspirin  81 mg Oral Daily  . bisoprolol  10 mg Oral Daily  . furosemide  40 mg Oral Daily  . hydrALAZINE  25 mg Oral BID  . isosorbide mononitrate  60 mg Oral Daily  . levothyroxine  100 mcg Oral QAC breakfast  . potassium chloride  20 mEq Oral QODAY  . warfarin  6 mg Oral ONCE-1800  . Warfarin - Pharmacist Dosing Inpatient   Does not apply q1800   . heparin      Assessment: 67 yo male on chronic Coumadin for afib.  Doses were held 7/3 and 7/4 due to an elevated INR.  Warfarin was resumed on 7/5 and INR this morning remains slightly subtherapeutic, although starting to rise today.  Heparin gtt started last night as bridge to therapeutic INR.  Heparin level low despite significant rate increase earlier today. No bleeding or complications noted per chart notes, and CBC stable.  Goal of Therapy:  Heparin level 0.3-0.7  units/ml INR 2-3 Monitor platelets by anticoagulation protocol: Yes   Plan:  1. Increase heparin to 2400 units/hr.  Will not bolus since INR > 1.5 today.  Check 6 hr heparin level. 2. Coumadin 6 mg po x 1 tonight. 3. Continue daily CBC, Heparin level and INR. 4. Monitor for signs or symptoms of bleeding.  Tad Moore, BCPS  Clinical Pharmacist Pager [phone]  12/13/2012 2:37 PM

## 2012-12-13 NOTE — Progress Notes (Signed)
Physical Therapy Treatment Patient Details Name: Cody Faulkner MRN: [id number] DOB: 1945-10-13 Today's Date: 12/13/2012 Time: 5784-6962 PT Time Calculation (min): 26 min  PT Assessment / Plan / Recommendation  PT Comments   Pt making steady progress.  Able to walk short distance using [employer].  Follow Up Recommendations  CIR     Does the patient have the potential to tolerate intense rehabilitation     Barriers to Discharge        Equipment Recommendations  3in1 (PT)    Recommendations for Other Services    Frequency Min 3X/week   Progress towards PT Goals Progress towards PT goals: Progressing toward goals  Plan Current plan remains appropriate    Precautions / Restrictions Precautions Precautions: Fall Restrictions Weight Bearing Restrictions: No   Pertinent Vitals/Pain See flow sheet.    Mobility  Bed Mobility Supine to Sit: 3: Mod assist;With rails;HOB elevated Sitting - Scoot to Edge of Bed: 4: Min assist;With rail Details for Bed Mobility Assistance: Assist to bring legs off bed and trunk up. Transfers Sit to Stand: 1: +2 Total assist Sit to Stand: Patient Percentage:  (using Huntley Dec Plus) Stand to Sit: 1: +2 Total assist Stand to Sit: Patient Percentage:  (using Huntley Dec Plus) Stand Pivot Transfers: 1: +2 Total assist Stand Pivot Transfers: Patient Percentage:  (using Newman Nip) Transfer via Lift Equipment: Hydrographic surveyor Details for Transfer Assistance: Pt able to perform sit to stand 3x with [employer]. Ambulation/Gait Ambulation/Gait Assistance: 1: +2 Total assist Ambulation/Gait: Patient Percentage:  (with Newman Nip) Ambulation Distance (Feet): 6 Feet Assistive device: Other (Comment) Huntley Dec Plus) Ambulation/Gait Assistance Details: Manual facilitation for hip extension. Gait Pattern: Step-to pattern;Decreased step length - right;Decreased step length - left;Shuffle General Gait Details: Step length shortened by Newman Nip.    Exercises     PT Diagnosis:    PT  Problem List:   PT Treatment Interventions:     PT Goals (current goals can now be found in the care plan section)    Visit Information  Last PT Received On: 12/13/12 Assistance Needed: +2 History of Present Illness: BRIEF PATIENT DESCRIPTION: 75 yowm with OSA / OHS (untreated) who presented to [facility] ER with increased SOB, sleepiness and dull chest pain.  Found to have hypercarbic respiratory failure and PCCM called for ICU admit. Of note, patient underwent cardioversion on 7/1 per Dr. Donnie Aho.  Supratherapeutic INR on presentation.      Subjective Data      Cognition  Cognition Arousal/Alertness: Awake/alert Behavior During Therapy: WFL for tasks assessed/performed Overall Cognitive Status: Within Functional Limits for tasks assessed    Balance  Balance Balance Assessed: Yes Static Standing Balance Static Standing - Balance Support: Bilateral upper extremity supported (on Sara Plus) Static Standing - Level of Assistance: 5: Stand by assistance (and Sara Plus) Static Standing - Comment/# of Minutes: Stood x 3 for 1-2 minutes each.  End of Session PT - End of Session Equipment Utilized During Treatment: Oxygen;Other (comment) Huntley Dec Plus) Activity Tolerance: Patient tolerated treatment well Patient left: in chair;with call bell/phone within reach Nurse Communication: Mobility status;Need for lift equipment   GP     [facility] 12/13/2012, 10:32 AM

## 2012-12-13 NOTE — Progress Notes (Signed)
ANTICOAGULATION CONSULT NOTE - Follow Up Consult  Pharmacy Consult for heparin bridge  Indication: atrial fibrillation  Allergies  Allergen Reactions  . Methimazole (Thiamazole) Itching and Rash    Severe rash    Patient Measurements: Height: 5\' 5"  (165.1 cm) Weight: 255 lb 11.7 oz (116 kg) IBW/kg (Calculated) : 61.5 Heparin Dosing Weight:   Vital Signs: Temp: 98.1 F (36.7 C) (07/07 2103) Temp src: Oral (07/07 2103) BP: 107/64 mmHg (07/07 2103) Pulse Rate: 69 (07/07 2103)  Labs:  Recent Labs  12/10/12 0625 12/11/12 0520 12/12/12 0403 12/12/12 2323  HGB  --   --  11.7*  --   HCT  --   --  36.2*  --   PLT  --   --  183  --   LABPROT 32.5* 22.7* 18.4*  --   INR 3.32* 2.08* 1.58*  --   HEPARINUNFRC  --   --   --  <0.10*  CREATININE 1.35 1.13 1.24  --     Estimated Creatinine Clearance: 67.2 ml/min (by C-G formula based on Cr of 1.24).   Medications:    Assessment: Heparin level undetectable. No infusion related issues.  Goal of Therapy:  Heparin level 0.3-0.7 units/ml Monitor platelets by anticoagulation protocol: Yes   Plan:  Increase to 2000 units/hr and recheck HL in 8 hours. (17units/kg abw)   Janice Coffin 12/13/2012,12:22 AM

## 2012-12-13 NOTE — Clinical Social Work Placement (Signed)
     Clinical Social Work Department CLINICAL SOCIAL WORK PLACEMENT NOTE 12/13/2012  Patient:  Cody Faulkner, Cody Faulkner  Account Number:  [id number] Admit date:  12/07/2012  Clinical Social Worker:  Lupita Leash Kevonna Nolte, LCSWA  Date/time:  12/13/2012 05:33 PM  Clinical Social Work is seeking post-discharge placement for this patient at the following level of care:   SKILLED NURSING   (*CSW will update this form in Epic as items are completed)   12/13/2012  Patient/family provided with [facility] Department of Clinical Social Works list of facilities offering this level of care within the geographic area requested by the patient (or if unable, by the patients family).  12/13/2012  Patient/family informed of their freedom to choose among providers that offer the needed level of care, that participate in Medicare, Medicaid or managed care program needed by the patient, have an available bed and are willing to accept the patient.    Patient/family informed of [facility] ownership interest in [facility], as well as of the fact that they are under no obligation to receive care at this facility.  PASARR submitted to EDS on  PASARR number received from EDS on   FL2 transmitted to all facilities in geographic area requested by pt/family on  12/13/2012 FL2 transmitted to all facilities within larger geographic area on   Patient informed that his/her managed care company has contracts with or will negotiate with  certain facilities, including the following:   Blue Medicare  Has existing PASARR in place     Patient/family informed of bed offers received:   Patient chooses bed at  Physician recommends and patient chooses bed at    Patient to be transferred to  on   Patient to be transferred to facility by Ambulance Sharin Mons)  The following physician request were entered in Epic:   Additional Comments:

## 2012-12-14 LAB — CBC
HCT: 33.8 % — ABNORMAL LOW (ref 39.0–52.0)
Hemoglobin: 10.7 g/dL — ABNORMAL LOW (ref 13.0–17.0)
RBC: 3.76 MIL/uL — ABNORMAL LOW (ref 4.22–5.81)
WBC: 5.1 10*3/uL (ref 4.0–10.5)

## 2012-12-14 LAB — PROTIME-INR
INR: 1.77 — ABNORMAL HIGH (ref 0.00–1.49)
Prothrombin Time: 20.1 seconds — ABNORMAL HIGH (ref 11.6–15.2)

## 2012-12-14 LAB — HEPARIN LEVEL (UNFRACTIONATED): Heparin Unfractionated: 0.42 IU/mL (ref 0.30–0.70)

## 2012-12-14 MED ORDER — WARFARIN SODIUM 7.5 MG PO TABS
7.5000 mg | ORAL_TABLET | Freq: Once | ORAL | Status: DC
  Filled 2012-12-14: qty 1

## 2012-12-14 MED ORDER — ENOXAPARIN SODIUM 120 MG/0.8ML ~~LOC~~ SOLN: 1.0000 mg/kg | Freq: Two times a day (BID) | SUBCUTANEOUS | Status: DC

## 2012-12-14 NOTE — Discharge Summary (Signed)
Physician Discharge Summary     Patient ID: Cody Faulkner MRN: [id number] DOB/AGE: [birth year] 67 y.o.  Admit date: 12/07/2012 Discharge date: 12/14/2012  Discharge Diagnoses:  Acute on Chronic Respiratory Failure  OSA / OHS  Bilateral pulmonary infiltrates  Atrial Fibrillation - s/p successful cardioversion on 7/1, chronic coumadin  HTN  CAD s/p AICD  Chronic Systolic CHF  Chest Pain, resolved - no evidence of active ischemia  Acute Renal Insufficiency / AKI, resolved Hypothyroidism with nl tsh 12/08/12  Gout Detailed Hospital Course:    67 y/o M, never smoker, with PMH of Afib on coumadin, HTN, Depression, CAD, Hypothyroidism, CHF, and OSA / OHS (untreated) who presented to [facility] ER on 7/2 with increased SOB, sleepiness and dull chest pain. EMS noted patient to be hypoxic with sats in low 70's. Oxygen applied and sats improved. Patient noted to have increased LE edema (2-3+ pitting). ABG review 7.28 / pCO2 of 62, O2 78, HCO3 29. ABG review notes that he usually is not a CO2 retainer. Found to have hypercarbic respiratory failure. Placed on BiPap in ER with minimal improvement in symptoms. Of note, patient underwent cardioversion on 7/1 per Dr. Donnie Aho. Supratherapeutic INR on presentation.   Admitted for what was believed to be decompensation of acute on chronic respiratory failure due to underlying obstructive sleep apnea and obesity hypoventilation syndrome, and chest x-ray which demonstrated diffuse pulmonary infiltrates, felt to be pulmonary edema versus pneumonitis. He was admitted to the intensive care, therapeutic interventions included: noninvasive mechanical ventilation, aggressive diuretics. As he was recently cardioverted by Dr. Nedra Hai, cardiac consultation was obtained. An echocardiogram was obtained on 7/3 this demonstrated ejection fraction at 50-55%, was felt patient had some degree of diastolic dysfunction. [name] continued to improve following diuretic therapy. As noted he was  previously in atrial fibrillation, and status post [facility] on by mouth amiodarone. Cardiology had recommended to continue systemic anticoagulation with heparin drip, and warfarin. In spite of aggressive diuresis, and improved clinical status [name] continues to have some degree of diffuse pulmonary infiltrate raising the question about possibility of amiodarone lung toxicity. From a pulmonary standpoint will defer discontinuation of amiodarone to cardiology, however do think that consideration of a trial of an alternative medication would be reasonable, if symptoms worsen consideration for steroid trial also reasonable. At time of discharge patient remains weak.he is seen by inpatient rehabilitation, and recommended for skilled nursing facility. He will be discharged to skilled nursing facility for further rehabilitation efforts..    Discharge Plan by diagnoses  OSA / OHS  Bilateral pulmonary infiltrates - edema vs pneumonitis (on amiodarone)  ESR 40 mm/hr. BNP 620, then 209  Recommendation: - Cont diuresis to keep I/Os slightly neg to even  - Doubt acute amiodarone toxicity but this remains under consideration - d/w Dr Eldridge Dace (will consider this further depending on his f/u CXR in clinic) - Change nebs to PRN only  - Cont supplemental O2 - Will have followup with Dr. Craige Cotta in our office, we will get f/u cxr, may need to discuss stopping amiodarone again.   Atrial Fibrillation - s/p successful cardioversion on 7/1, chronic coumadin  HTN  CAD s/p AICD  Chronic Systolic CHF  Chest Pain, resolved - no evidence of active ischemia  Recommendation: -He needs a followup appointment with Dr. Donnie Aho 69 weeks.  -Continue amiodarone for now, however as mentioned above may be reasonable to consider alternative drug, as he is now status post successful cardioversion and demonstrates diffuse pulmonary infiltrates -  Continue systemic anticoagulation. He will need to continue warfarin, with bridging  accomplished by using low molecular weight heparin  Hypothyroidism with nl tsh 12/08/12  Gout  Plan:  - Hold allopurinol  - Continue synthroid same dose    Significant Hospital tests/ studies/ interventions and procedures  Echocardiogram 7/2: EF 50-55%, evidence of diastolic dysfunction. Consults: Dr. Donnie Aho with cardiology  Discharge Exam: BP 121/74  Pulse 71  Temp(Src) 98.8 F (37.1 C) (Oral)  Resp 20  Ht 5\' 5"  (1.651 m)  Wt 116.574 kg (257 lb)  BMI 42.77 kg/m2  SpO2 97%  2 liters   General: Nad  Neuro: Awake, follows commands  HEENT: Short thick neck, mm pink/moist  Cardiovascular: Paced rhythm, RRR s M  Lungs: bibasilar rales, no wheezes  Abdomen: Obese, soft  Ext: no edema, chronic stasis changes  Labs at discharge Lab Results  Component Value Date   CREATININE 1.24 12/12/2012   BUN 31* 12/12/2012   NA 138 12/12/2012   K 3.9 12/12/2012   CL 98 12/12/2012   CO2 35* 12/12/2012   Lab Results  Component Value Date   WBC 5.1 12/14/2012   HGB 10.7* 12/14/2012   HCT 33.8* 12/14/2012   MCV 89.9 12/14/2012   PLT 190 12/14/2012   Lab Results  Component Value Date   ALT 20 06/24/2011   AST 14 06/24/2011   ALKPHOS 54 06/24/2011   BILITOT 1.0 06/24/2011   Lab Results  Component Value Date   INR 1.77* 12/14/2012   INR 1.69* 12/13/2012   INR 1.58* 12/12/2012    Current radiology studies No results found.  Disposition: SNF  Discharge Orders   Future Appointments Provider Department Dept Phone   12/22/2012 1:30 PM Coralyn Helling, MD [facility] [phone]   Future Orders Complete By Expires     Diet - low sodium heart healthy  As directed     Discharge instructions  As directed     Comments:      Oxygen 2 liters all times Keep follow up appointment    Increase activity slowly  As directed         Medication List    STOP taking these medications       allopurinol 100 MG tablet  Commonly known as:  ZYLOPRIM     cetaphil cream     doxycycline 100 MG tablet    Commonly known as:  ADOXA      TAKE these medications       acetaminophen 500 MG tablet  Commonly known as:  TYLENOL  Take 500 mg by mouth every 6 (six) hours as needed.     albuterol 108 (90 BASE) MCG/ACT inhaler  Commonly known as:  PROVENTIL HFA;VENTOLIN HFA  Inhale 2 puffs into the lungs every 4 (four) hours as needed. For shortness of breath     amiodarone 200 MG tablet  Commonly known as:  PACERONE  Take 200 mg by mouth 2 (two) times daily.     aspirin 81 MG chewable tablet  Chew 81 mg by mouth daily.     bisoprolol 10 MG tablet  Commonly known as:  ZEBETA  Take 10 mg by mouth daily.     budesonide-formoterol 160-4.5 MCG/ACT inhaler  Commonly known as:  SYMBICORT  Inhale 2 puffs into the lungs 2 (two) times daily.     enoxaparin 120 MG/0.8ML injection  Commonly known as:  LOVENOX  Inject 0.78 mLs (115 mg total) into the skin every 12 (twelve) hours. Can  be stopped after INR is therapeutic      fenofibrate micronized 134 MG capsule  Commonly known as:  LOFIBRA  Take 134 mg by mouth daily before breakfast.     furosemide 40 MG tablet  Commonly known as:  LASIX  Take 40 mg by mouth daily.     hydrALAZINE 25 MG tablet  Commonly known as:  APRESOLINE  Take 25 mg by mouth 2 (two) times daily.     isosorbide mononitrate 60 MG 24 hr tablet  Commonly known as:  IMDUR  Take 60 mg by mouth daily.     levothyroxine 100 MCG tablet  Commonly known as:  SYNTHROID, LEVOTHROID  Take 100 mcg by mouth daily.     loratadine 10 MG tablet  Commonly known as:  CLARITIN  Take 10 mg by mouth every morning.     potassium chloride SA 20 MEQ tablet  Commonly known as:  K-DUR,KLOR-CON  Take 20 mEq by mouth every other day.     warfarin 5 MG tablet  Commonly known as:  COUMADIN  - Take 5-7.5 mg by mouth daily. Monday, Wednesday, and Friday, take 7.5 mg (one and a half tablets)  - Tuesday, Thursday, Saturday, Sunday, take 5 mg (one tablet)       Follow-up Information    Follow up with TILLEY JR,W SPENCER, MD. Schedule an appointment as soon as possible for a visit in 2 weeks.   Contact information:   [address] [place] [address] [phone]       Follow up with Coralyn Helling, MD On 12/22/2012. (1pm for check in then 130 with sood. )    Contact information:   [address] [place] [address] [phone]       Discharged Condition: fair   Signed: BABCOCK,PETE 12/14/2012, 1:05 PM  Physician Statement:   The Patient was personally examined, the discharge assessment and plan has been personally reviewed and I agree with ACNP Babcock's assessment and plan. > 30 minutes of time have been dedicated to discharge assessment, planning and discharge instructions.   ALVA,RAKESH V.
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
## 2012-12-14 NOTE — Progress Notes (Signed)
Subjective:  Patient seen earlier in the day but note was not written until now. He is feeling better at this time but is planning to go to rehabilitation because of difficulty walking. He also lives alone. Of note amiodarone was just started recently. He had been off of it for some time. His sedimentation rate was not that high and he previously did not have any pulmonary complications from amiodarone. He denies angina at this time and states his breathing is better. Is not currently short of breath and is maintaining sinus rhythm.  Objective:  Vital Signs in the last 24 hours: BP 106/66  Pulse 69  Temp(Src) 97.9 F (36.6 C) (Oral)  Resp 20  Ht 5\' 5"  (1.651 m)  Wt 116.574 kg (257 lb)  BMI 42.77 kg/m2  SpO2 94%  Physical Exam: Obese WM alert, in NAD Lungs:  Reduced breath sounds Cardiac:  Regular rhythm, normal S1 and S2, no S3 Abdomen:  Soft, nontender, no masses Extremities: 1+ edema present, changes of chronic venous disease  Intake/Output from previous day: 07/08 0701 - 07/09 0700 In: 999.6 [P.O.:360; I.V.:639.6] Out: 1000 [Urine:1000] Weight Filed Weights   12/11/12 0404 12/12/12 0500 12/13/12 0345  Weight: 119 kg (262 lb 5.6 oz) 116 kg (255 lb 11.7 oz) 116.574 kg (257 lb)   Lab Results: Basic Metabolic Panel:  Recent Labs  16/10/96 0403  NA 138  K 3.9  CL 98  CO2 35*  GLUCOSE 105*  BUN 31*  CREATININE 1.24    CBC:  Recent Labs  12/13/12 0530 12/14/12 0425  WBC 7.4 5.1  HGB 11.7* 10.7*  HCT 37.5* 33.8*  MCV 91.0 89.9  PLT 201 190   BNP    Component Value Date/Time   PROBNP 209.3* 12/12/2012 0403   PROTIME: Lab Results  Component Value Date   INR 1.77* 12/14/2012   INR 1.69* 12/13/2012   INR 1.58* 12/12/2012    Telemetry: Sinus rhythm (pace atrial)  Assessment/Plan:  1. Obesity hypoventilaltion syndrome 2. Atrial fibrillation in normal sinus rhythm 3. Acute diastolic CHF clinically better.  BNP was not that high.  4. Warfarin anticoagulation  subtherapeutic  Rec:  Unclear what to do about the amiodarone.  Not a good candidate for the other agents due to his fluctuating renal status.  I would consider reducing him to once daily now.  Darden Palmer  MD [facility]  12/14/2012, 5:21 PM

## 2012-12-14 NOTE — Progress Notes (Signed)
Discharge instructions given to EMS who will transport pt to SNF. Pt is stable for discharge. Report called to SNF.

## 2012-12-14 NOTE — Progress Notes (Signed)
ANTICOAGULATION CONSULT NOTE - Follow Up Consult  Pharmacy Consult for heparin Indication: atrial fibrillation  Labs:  Recent Labs  12/11/12 0520 12/12/12 0403  12/13/12 0530 12/13/12 0700 12/13/12 1300 12/13/12 2150  HGB  --  11.7*  --  11.7*  --   --   --   HCT  --  36.2*  --  37.5*  --   --   --   PLT  --  183  --  201  --   --   --   LABPROT 22.7* 18.4*  --  19.4*  --   --   --   INR 2.08* 1.58*  --  1.69*  --   --   --   HEPARINUNFRC  --   --   < >  --  0.14* <0.10* 0.31  CREATININE 1.13 1.24  --   --   --   --   --   < > = values in this interval not displayed.   Assessment: 67yo male now therapeutic on heparin after rate increases though at very low end of goal.  Goal of Therapy:  Heparin level 0.3-0.7 units/ml   Plan:  Will increase heparin gtt by 1 unit/kg/hr to 2500 units/hr to keep heparin level within goal range and check level with am labs.  Vernard Gambles, PharmD, BCPS  12/14/2012,12:07 AM

## 2012-12-22 ENCOUNTER — Inpatient Hospital Stay: Payer: Medicare Other | Admitting: Pulmonary Disease

## 2012-12-31 ENCOUNTER — Inpatient Hospital Stay (HOSPITAL_COMMUNITY)
Admission: AD | Admit: 2012-12-31 | Discharge: 2013-01-03 | DRG: 193 | Disposition: A | Discharge status: Skilled Nursing Facility | Payer: Medicare Other | Source: Other Acute Inpatient Hospital | Attending: Internal Medicine | Admitting: Internal Medicine

## 2012-12-31 ENCOUNTER — Encounter (HOSPITAL_COMMUNITY): Payer: Self-pay | Admitting: Internal Medicine

## 2012-12-31 ENCOUNTER — Inpatient Hospital Stay (HOSPITAL_COMMUNITY): Payer: Medicare Other

## 2012-12-31 DIAGNOSIS — Z79899 Other long term (current) drug therapy: Secondary | ICD-10-CM

## 2012-12-31 DIAGNOSIS — I959 Hypotension, unspecified: Secondary | ICD-10-CM | POA: Diagnosis not present

## 2012-12-31 DIAGNOSIS — I2510 Atherosclerotic heart disease of native coronary artery without angina pectoris: Secondary | ICD-10-CM | POA: Diagnosis present

## 2012-12-31 DIAGNOSIS — E039 Hypothyroidism, unspecified: Secondary | ICD-10-CM | POA: Diagnosis present

## 2012-12-31 DIAGNOSIS — E662 Morbid (severe) obesity with alveolar hypoventilation: Secondary | ICD-10-CM

## 2012-12-31 DIAGNOSIS — J9600 Acute respiratory failure, unspecified whether with hypoxia or hypercapnia: Secondary | ICD-10-CM

## 2012-12-31 DIAGNOSIS — I509 Heart failure, unspecified: Secondary | ICD-10-CM | POA: Diagnosis present

## 2012-12-31 DIAGNOSIS — Z95810 Presence of automatic (implantable) cardiac defibrillator: Secondary | ICD-10-CM

## 2012-12-31 DIAGNOSIS — G4731 Primary central sleep apnea: Secondary | ICD-10-CM

## 2012-12-31 DIAGNOSIS — I119 Hypertensive heart disease without heart failure: Secondary | ICD-10-CM

## 2012-12-31 DIAGNOSIS — N189 Chronic kidney disease, unspecified: Secondary | ICD-10-CM | POA: Diagnosis present

## 2012-12-31 DIAGNOSIS — R791 Abnormal coagulation profile: Secondary | ICD-10-CM | POA: Diagnosis present

## 2012-12-31 DIAGNOSIS — I5032 Chronic diastolic (congestive) heart failure: Secondary | ICD-10-CM | POA: Diagnosis present

## 2012-12-31 DIAGNOSIS — I129 Hypertensive chronic kidney disease with stage 1 through stage 4 chronic kidney disease, or unspecified chronic kidney disease: Secondary | ICD-10-CM | POA: Diagnosis present

## 2012-12-31 DIAGNOSIS — D72829 Elevated white blood cell count, unspecified: Secondary | ICD-10-CM | POA: Diagnosis present

## 2012-12-31 DIAGNOSIS — I5042 Chronic combined systolic (congestive) and diastolic (congestive) heart failure: Secondary | ICD-10-CM | POA: Diagnosis present

## 2012-12-31 DIAGNOSIS — I480 Paroxysmal atrial fibrillation: Secondary | ICD-10-CM | POA: Diagnosis present

## 2012-12-31 DIAGNOSIS — I4891 Unspecified atrial fibrillation: Secondary | ICD-10-CM

## 2012-12-31 DIAGNOSIS — Z7901 Long term (current) use of anticoagulants: Secondary | ICD-10-CM

## 2012-12-31 DIAGNOSIS — Z7982 Long term (current) use of aspirin: Secondary | ICD-10-CM

## 2012-12-31 DIAGNOSIS — J189 Pneumonia, unspecified organism: Principal | ICD-10-CM | POA: Diagnosis present

## 2012-12-31 DIAGNOSIS — J45909 Unspecified asthma, uncomplicated: Secondary | ICD-10-CM | POA: Diagnosis present

## 2012-12-31 DIAGNOSIS — R042 Hemoptysis: Secondary | ICD-10-CM

## 2012-12-31 DIAGNOSIS — G4733 Obstructive sleep apnea (adult) (pediatric): Secondary | ICD-10-CM | POA: Diagnosis present

## 2012-12-31 DIAGNOSIS — J9601 Acute respiratory failure with hypoxia: Secondary | ICD-10-CM

## 2012-12-31 LAB — COMPREHENSIVE METABOLIC PANEL
ALT: 19 U/L (ref 0–53)
AST: 18 U/L (ref 0–37)
Albumin: 3.4 g/dL — ABNORMAL LOW (ref 3.5–5.2)
Alkaline Phosphatase: 49 U/L (ref 39–117)
GFR calc Af Amer: 46 mL/min — ABNORMAL LOW (ref >90)
Glucose, Bld: 95 mg/dL (ref 70–99)
Potassium: 3.9 mEq/L (ref 3.5–5.1)
Sodium: 136 mEq/L (ref 135–145)
Total Protein: 6.4 g/dL (ref 6.0–8.3)

## 2012-12-31 LAB — CBC WITH DIFFERENTIAL/PLATELET
Basophils Absolute: 0 10*3/uL (ref 0.0–0.1)
Basophils Relative: 0 % (ref 0–1)
Eosinophils Absolute: 0 10*3/uL (ref 0.0–0.7)
Eosinophils Relative: 0 % (ref 0–5)
HCT: 36.2 % — ABNORMAL LOW (ref 39.0–52.0)
MCH: 27.8 pg (ref 26.0–34.0)
MCHC: 32.6 g/dL (ref 30.0–36.0)
MCV: 85.4 fL (ref 78.0–100.0)
Monocytes Absolute: 1.1 10*3/uL — ABNORMAL HIGH (ref 0.1–1.0)
Monocytes Relative: 8 % (ref 3–12)
RDW: 14.8 % (ref 11.5–15.5)

## 2012-12-31 LAB — MRSA PCR SCREENING: MRSA by PCR: NEGATIVE

## 2012-12-31 MED ORDER — FUROSEMIDE 10 MG/ML IJ SOLN: 40.0000 mg | Freq: Every day | INTRAMUSCULAR | Status: DC

## 2012-12-31 MED ORDER — HYDRALAZINE HCL 25 MG PO TABS
25.0000 mg | ORAL_TABLET | Freq: Two times a day (BID) | ORAL | Status: DC
  Administered 2012-12-31 – 2013-01-01 (×3): 25 mg via ORAL
  Filled 2012-12-31 (×4): qty 1

## 2012-12-31 MED ORDER — ACETAMINOPHEN 325 MG PO TABS
650.0000 mg | ORAL_TABLET | Freq: Four times a day (QID) | ORAL | Status: DC | PRN
  Administered 2013-01-03: 650 mg via ORAL

## 2012-12-31 MED ORDER — FUROSEMIDE 40 MG PO TABS
40.0000 mg | ORAL_TABLET | Freq: Every day | ORAL | Status: DC
  Administered 2012-12-31 – 2013-01-03 (×4): 40 mg via ORAL
  Filled 2012-12-31 (×4): qty 1

## 2012-12-31 MED ORDER — LEVOTHYROXINE SODIUM 100 MCG PO TABS
100.0000 ug | ORAL_TABLET | Freq: Every day | ORAL | Status: DC
  Administered 2012-12-31 – 2013-01-03 (×4): 100 ug via ORAL
  Filled 2012-12-31 (×5): qty 1

## 2012-12-31 MED ORDER — ACETAMINOPHEN 650 MG RE SUPP: 650.0000 mg | Freq: Four times a day (QID) | RECTAL | Status: DC | PRN

## 2012-12-31 MED ORDER — DEXTROSE 5 % IV SOLN
1.0000 g | Freq: Two times a day (BID) | INTRAVENOUS | Status: DC
  Administered 2012-12-31 – 2013-01-02 (×4): 1 g via INTRAVENOUS
  Filled 2012-12-31 (×5): qty 1

## 2012-12-31 MED ORDER — POTASSIUM CHLORIDE CRYS ER 20 MEQ PO TBCR
20.0000 meq | EXTENDED_RELEASE_TABLET | ORAL | Status: DC
  Administered 2012-12-31 – 2013-01-02 (×2): 20 meq via ORAL
  Filled 2012-12-31 (×2): qty 1
  Filled 2012-12-31: qty 2

## 2012-12-31 MED ORDER — VANCOMYCIN HCL 10 G IV SOLR
2000.0000 mg | Freq: Once | INTRAVENOUS | Status: AC
  Administered 2012-12-31: 2000 mg via INTRAVENOUS
  Filled 2012-12-31 (×2): qty 2000

## 2012-12-31 MED ORDER — AMIODARONE HCL 200 MG PO TABS
200.0000 mg | ORAL_TABLET | Freq: Two times a day (BID) | ORAL | Status: DC
  Filled 2012-12-31 (×2): qty 1

## 2012-12-31 MED ORDER — VANCOMYCIN HCL 10 G IV SOLR
1500.0000 mg | INTRAVENOUS | Status: DC
  Administered 2013-01-01 – 2013-01-02 (×2): 1500 mg via INTRAVENOUS
  Filled 2012-12-31 (×2): qty 1500

## 2012-12-31 MED ORDER — ONDANSETRON HCL 4 MG PO TABS: 4.0000 mg | ORAL_TABLET | Freq: Four times a day (QID) | ORAL | Status: DC | PRN

## 2012-12-31 MED ORDER — LEVOFLOXACIN IN D5W 750 MG/150ML IV SOLN
750.0000 mg | INTRAVENOUS | Status: DC
  Administered 2012-12-31: 750 mg via INTRAVENOUS
  Filled 2012-12-31: qty 150

## 2012-12-31 MED ORDER — AMIODARONE HCL 200 MG PO TABS
200.0000 mg | ORAL_TABLET | Freq: Every day | ORAL | Status: DC
  Administered 2012-12-31 – 2013-01-01 (×2): 200 mg via ORAL
  Filled 2012-12-31: qty 1

## 2012-12-31 MED ORDER — ISOSORBIDE MONONITRATE ER 60 MG PO TB24
60.0000 mg | ORAL_TABLET | Freq: Every day | ORAL | Status: DC
  Administered 2012-12-31 – 2013-01-01 (×2): 60 mg via ORAL
  Filled 2012-12-31 (×2): qty 1

## 2012-12-31 MED ORDER — WARFARIN - PHARMACIST DOSING INPATIENT: Freq: Every day | Status: DC

## 2012-12-31 MED ORDER — FENOFIBRATE 160 MG PO TABS
160.0000 mg | ORAL_TABLET | Freq: Every day | ORAL | Status: DC
  Administered 2012-12-31 – 2013-01-03 (×4): 160 mg via ORAL
  Filled 2012-12-31 (×4): qty 1

## 2012-12-31 MED ORDER — SODIUM CHLORIDE 0.9 % IJ SOLN
3.0000 mL | Freq: Two times a day (BID) | INTRAMUSCULAR | Status: DC
  Administered 2012-12-31 – 2013-01-03 (×5): 3 mL via INTRAVENOUS

## 2012-12-31 MED ORDER — ALBUTEROL SULFATE HFA 108 (90 BASE) MCG/ACT IN AERS: 2.0000 | INHALATION_SPRAY | RESPIRATORY_TRACT | Status: DC | PRN

## 2012-12-31 MED ORDER — LEVOFLOXACIN IN D5W 750 MG/150ML IV SOLN
750.0000 mg | INTRAVENOUS | Status: DC
  Administered 2013-01-02: 750 mg via INTRAVENOUS
  Filled 2012-12-31: qty 150

## 2012-12-31 MED ORDER — ONDANSETRON HCL 4 MG/2ML IJ SOLN: 4.0000 mg | Freq: Four times a day (QID) | INTRAMUSCULAR | Status: DC | PRN

## 2012-12-31 MED ORDER — BUDESONIDE-FORMOTEROL FUMARATE 160-4.5 MCG/ACT IN AERO
2.0000 | INHALATION_SPRAY | Freq: Two times a day (BID) | RESPIRATORY_TRACT | Status: DC
  Administered 2012-12-31 – 2013-01-03 (×6): 2 via RESPIRATORY_TRACT
  Filled 2012-12-31 (×2): qty 6

## 2012-12-31 MED ORDER — LORATADINE 10 MG PO TABS
10.0000 mg | ORAL_TABLET | Freq: Every morning | ORAL | Status: DC
  Administered 2012-12-31 – 2013-01-03 (×4): 10 mg via ORAL
  Filled 2012-12-31 (×4): qty 1

## 2012-12-31 MED ORDER — DEXTROSE 5 % IV SOLN
1.0000 g | Freq: Three times a day (TID) | INTRAVENOUS | Status: DC
  Administered 2012-12-31: 1 g via INTRAVENOUS
  Filled 2012-12-31 (×2): qty 1

## 2012-12-31 MED ORDER — BISOPROLOL FUMARATE 10 MG PO TABS
10.0000 mg | ORAL_TABLET | Freq: Every day | ORAL | Status: DC
  Administered 2012-12-31 – 2013-01-01 (×2): 10 mg via ORAL
  Filled 2012-12-31 (×2): qty 1

## 2012-12-31 MED ORDER — SODIUM CHLORIDE 0.9 % IJ SOLN
3.0000 mL | Freq: Two times a day (BID) | INTRAMUSCULAR | Status: DC
  Administered 2012-12-31 – 2013-01-02 (×6): 3 mL via INTRAVENOUS

## 2012-12-31 NOTE — Progress Notes (Signed)
Pt arrived on floor at ~ 0500. Admissions MD paged. Received call from Dr.Kakrakandy , stated he will be up to assess pt soon.

## 2012-12-31 NOTE — Progress Notes (Addendum)
[facility] PROGRESS NOTE  Cody Faulkner AOZ:[id number] DOB: [birth year] DOA: 12/31/2012 PCP: Ailene Ravel, MD I have seen and examined pt who is a 66yo admitted this am by Dr Toniann Fail with hypoxia and hemoptysis with fever>>CXR with improved bibasilar airspace opacities -from his recent admission 3wks ago for hypercarbic resp failure due to OSA  And CHF.He states his breathing is better today, cough decreased and no further hemoptysis. Will continue current management plan as per Dr Toniann Fail and follow.       Kela Millin  [facility] Pager [phone]. If 7PM-7AM, please contact night-coverage at [email or web address], password [facility] 12/31/2012, 4:25 PM  LOS: 0 days

## 2012-12-31 NOTE — Progress Notes (Signed)
ANTICOAGULATION CONSULT NOTE - Initial Consult Pharmacy Consult for Vancomycin/Cefepime/Levaquin and Coumadin Indication: pneumonia and h/o Afib  Allergies  Allergen Reactions  . Methimazole (Thiamazole) Itching and Rash    Severe rash    Patient Measurements: Height: 5\' 5"  (165.1 cm) Weight: 256 lb 13.4 oz (116.5 kg) IBW/kg (Calculated) : 61.5  Vital Signs: Temp: 98.8 F (37.1 C) (07/26 0504) Temp src: Oral (07/26 0504) BP: 133/90 mmHg (07/26 0504) Pulse Rate: 71 (07/26 0504)  Labs (at [facility]): WBC 14.1 Hgb 12.8 Hct 38.6 Plt 243  INR 4.2  SCr 1.6 No results found for this basename: HGB, HCT, PLT, APTT, LABPROT, INR, HEPARINUNFRC, CREATININE, CKTOTAL, CKMB, TROPONINI,  in the last 72 hours  Estimated Creatinine Clearance: 67.3 ml/min (by C-G formula based on Cr of 1.24).   Medical History: Past Medical History  Diagnosis Date  . Atrial fibrillation     Cardioversion 2014  . Hypertension   . OSA (obstructive sleep apnea)        . Depression   . CAD (coronary artery disease)     predominantly single vessel  . Ventricular tachycardia     s/p defibrillator-Medtronic EnTrust [id number]  . Dilated cardiomyopathy     s/p defibrillator Medtronic EnTrust [phone]  . Asthma   . Implantable Defibrillator     Medtronic Entrust  . Hypothyroidism   . Solitary kidney 06/05/2006  . Gout   . Pneumonia, community acquired     bilateral bibasilar   . Hypothyroidism   . History of hyperthyroidism   . Left knee DJD     needs surgery    Medications: Coumadin 5 mg daily except 7.5 mg Mon/Thu (last dose 7/25) ASA  Zebeta  Symbicort  Fenofibrate  Lasix  Hydralazine  Imdur  Synthroid  Claritin  KCL  Vit D  Amiodarone  Colace    Assessment: 67 yo male with hemoptysis/fever and possible PNA for empiric antibiotics.  Coumadin continuing for history of atrial fibrillation  Goal of Therapy:  Vancomycin trough 15-20 INR 2-3   Plan:  Vancomycin 2 g IV now, then 1500 mg IV  q24h Cefepime 1 g IV q8h Levaquin 750 mg IV q24h No Coumadin today F/U daily INR  Eddie Candle 12/31/2012,7:03 AM

## 2012-12-31 NOTE — H&P (Signed)
[facility] History and Physical  Cody Faulkner:[id number] DOB: Jul 10, 1945 DOA: 12/31/2012  Referring physician: Patient was transferred from [facility] for shortness of breath with pneumonia. PCP: Ailene Ravel, MD  Specialists: Dr. Donnie Aho. Cardiologist.  Chief Complaint: Shortness of breath and hemoptysis.  HPI: Cody Faulkner is a 67 y.o. male who was recently admitted 3 weeks ago for hypercarbic respiratory failure secondary to OSA and associated CHF was brought to the ER at [facility] because of hypoxia and hemoptysis with fever. In the ER patient was found to have temperatures around 102.54F with chest x-ray showing perihilar infiltrates and patient also had corrective cough since yesterday. Patient also complained of hemoptysis. Patient's INR in the ER was found to be around 4. Patient was started on empiric antibiotics and transferred to [facility] cone at patient's and family's request. ABG done over there revealed a pH of 7.43 with PCO2 of 40 and PO2 of 53. Patient was placed on 3 L oxygen. On my exam patient is not in acute distress and denies any chest pain nausea vomiting abdominal pain or diarrhea.  Review of Systems: As presented in the history of presenting illness, rest negative.  Past Medical History  Diagnosis Date  . Atrial fibrillation     Cardioversion 2014  . Hypertension   . OSA (obstructive sleep apnea)        . Depression   . CAD (coronary artery disease)     predominantly single vessel  . Ventricular tachycardia     s/p defibrillator-Medtronic EnTrust [id number]  . Dilated cardiomyopathy     s/p defibrillator Medtronic EnTrust [phone]  . Asthma   . Implantable Defibrillator     Medtronic Entrust  . Hypothyroidism   . Solitary kidney 06/05/2006  . Gout   . Pneumonia, community acquired     bilateral bibasilar   . Hypothyroidism   . History of hyperthyroidism   . Left knee DJD     needs surgery   Past Surgical History  Procedure  Laterality Date  . Cardiac defibrillator placement  2007    Medtronic EnTrust [phone]  . Nephrectomy  1976    Right  . Cardiac catheterization  2007    60% Stenosis mid-CFX  . Knee arthroscopy      left  . Cardioversion  06/18/2011    Procedure: CARDIOVERSION;  Surgeon: Darden Palmer., MD;  Location: [facility] OR;  Service: Cardiovascular;  Laterality: N/A;  . Tee without cardioversion  07/13/2012    Procedure: TRANSESOPHAGEAL ECHOCARDIOGRAM (TEE);  Surgeon: Lewayne Bunting, MD;  Location: [facility] ENDOSCOPY;  Service: Cardiovascular;  Laterality: N/A;  . Cardioversion N/A 12/06/2012    Procedure: CARDIOVERSION;  Surgeon: Othella Boyer, MD;  Location: [facility] ENDOSCOPY;  Service: Cardiovascular;  Laterality: N/A;   Social History:  reports that he has never smoked. He has never used smokeless tobacco. He reports that  drinks alcohol. He reports that he does not use illicit drugs. Nursing home. where does patient live-- Can do ADLs. Can patient participate in ADLs?  Allergies  Allergen Reactions  . Methimazole (Thiamazole) Itching and Rash    Severe rash    Family History  Problem Relation Age of Onset  . Heart disease Other     uncle  . Diabetes Other     uncle      Prior to Admission medications   Medication Sig Start Date End Date Taking? Authorizing Provider  acetaminophen (TYLENOL) 500 MG tablet Take 500 mg by mouth every  6 (six) hours as needed.     Historical Provider, MD  albuterol (PROVENTIL HFA;VENTOLIN HFA) 108 (90 BASE) MCG/ACT inhaler Inhale 2 puffs into the lungs every 4 (four) hours as needed. For shortness of breath     Historical Provider, MD  amiodarone (PACERONE) 200 MG tablet Take 200 mg by mouth 2 (two) times daily.    Historical Provider, MD  aspirin 81 MG chewable tablet Chew 81 mg by mouth daily.    Historical Provider, MD  bisoprolol (ZEBETA) 10 MG tablet Take 10 mg by mouth daily.    Historical Provider, MD  budesonide-formoterol (SYMBICORT) 160-4.5 MCG/ACT inhaler  Inhale 2 puffs into the lungs 2 (two) times daily.    Historical Provider, MD  enoxaparin (LOVENOX) 120 MG/0.8ML injection Inject 0.78 mLs (115 mg total) into the skin every 12 (twelve) hours. 12/14/12   Simonne Martinet, NP  fenofibrate micronized (LOFIBRA) 134 MG capsule Take 134 mg by mouth daily before breakfast.  05/21/12   Historical Provider, MD  furosemide (LASIX) 40 MG tablet Take 40 mg by mouth daily.     Historical Provider, MD  hydrALAZINE (APRESOLINE) 25 MG tablet Take 25 mg by mouth 2 (two) times daily.     Historical Provider, MD  isosorbide mononitrate (IMDUR) 60 MG 24 hr tablet Take 60 mg by mouth daily.    Historical Provider, MD  levothyroxine (SYNTHROID, LEVOTHROID) 100 MCG tablet Take 100 mcg by mouth daily.    Historical Provider, MD  loratadine (CLARITIN) 10 MG tablet Take 10 mg by mouth every morning.     Historical Provider, MD  potassium chloride SA (K-DUR,KLOR-CON) 20 MEQ tablet Take 20 mEq by mouth every other day.    Historical Provider, MD  warfarin (COUMADIN) 5 MG tablet Take 5-7.5 mg by mouth daily. Monday, Wednesday, and Friday, take 7.5 mg (one and a half tablets) Tuesday, Thursday, Saturday, Sunday, take 5 mg (one tablet)    Historical Provider, MD   Physical Exam: Filed Vitals:   12/31/12 0504  BP: 133/90  Pulse: 71  Temp: 98.8 F (37.1 C)  TempSrc: Oral  Resp: 20  Height: 5\' 5"  (1.651 m)  Weight: 116.5 kg (256 lb 13.4 oz)  SpO2: 92%     General:  Well-developed and nourished.  Eyes: Anicteric no pallor.  ENT: No discharge from the ears eyes nose or mouth.  Neck: No mass felt.  Cardiovascular: S1-S2 heard.  Respiratory: No rhonchi or crepitations.  Abdomen: Soft nontender bowel sounds present.  Skin: Chronic skin changes in the lower extremity.  Musculoskeletal: No edema.  Psychiatric: Appears normal.  Neurologic: Alert awake oriented to time place and person. Moves all extremities.  Labs on Admission:  Basic Metabolic Panel: No  results found for this basename: NA, K, CL, CO2, GLUCOSE, BUN, CREATININE, CALCIUM, MG, PHOS,  in the last 168 hours Liver Function Tests: No results found for this basename: AST, ALT, ALKPHOS, BILITOT, PROT, ALBUMIN,  in the last 168 hours No results found for this basename: LIPASE, AMYLASE,  in the last 168 hours No results found for this basename: AMMONIA,  in the last 168 hours CBC: No results found for this basename: WBC, NEUTROABS, HGB, HCT, MCV, PLT,  in the last 168 hours Cardiac Enzymes: No results found for this basename: CKTOTAL, CKMB, CKMBINDEX, TROPONINI,  in the last 168 hours  BNP (last 3 results)  Recent Labs  12/07/12 1213 12/08/12 0419 12/12/12 0403  PROBNP 530.9* 619.8* 209.3*   CBG: No results found for  this basename: GLUCAP,  in the last 168 hours  Radiological Exams on Admission: No results found.  EKG: Independently reviewed. Sinus rhythm.  Assessment/Plan Principal Problem:   Acute respiratory failure with hypoxia Active Problems:   Atrial fibrillation   Chronic diastolic heart failure   Hypothyroidism   Long-term (current) use of anticoagulants   Pneumonia   Hemoptysis   1. Acute hypoxic respiratory failure secondary to pneumonia - continue empiric antibiotics. Patient has been placed on vancomycin cefepime and Levaquin. 2. Chronic systolic heart failure status post ICD placement last EF measured was in December 07, 2012 was 55% - patient is on Lasix 40 mg by mouth daily continue that for now. Closely follow intake output and metabolic panel. 3. Atrial fibrillation rate controlled with recent cardioversion - continue amiodarone and Coumadin per pharmacy. There was concern about pulmonary infiltrates cause by amiodarone. Recheck chest x-ray has been ordered. 4. Hemoptysis with supratherapeutic INR - recheck chest x-ray and INR. I see patient's medication list the patient is on Coumadin and Lovenox. Lovenox has been discontinued. Coumadin per pharmacy.Until  hemoptysis get better and INR becomes therapeutic we will consider holding aspirin. 5. Hypothyroidism - continue Synthroid. Check TSH. 6. History of OSA/OHS - presently looks stable. 7. Chronic kidney disease - closely follow intake output. 8. History of CAD - denies any chest pain.    Code Status: Full code.  Family Communication: None.  Disposition Plan: Admit to inpatient.    Kiarra Kidd N. [facility] Pager [phone].  If 7PM-7AM, please contact night-coverage [email or web address] Password [facility] 12/31/2012, 6:49 AM

## 2013-01-01 DIAGNOSIS — I4891 Unspecified atrial fibrillation: Secondary | ICD-10-CM

## 2013-01-01 LAB — BASIC METABOLIC PANEL
BUN: 24 mg/dL — ABNORMAL HIGH (ref 6–23)
Calcium: 9.2 mg/dL (ref 8.4–10.5)
GFR calc non Af Amer: 39 mL/min — ABNORMAL LOW (ref >90)
Glucose, Bld: 102 mg/dL — ABNORMAL HIGH (ref 70–99)

## 2013-01-01 LAB — PROTIME-INR: Prothrombin Time: 32 seconds — ABNORMAL HIGH (ref 11.6–15.2)

## 2013-01-01 LAB — CBC
Hemoglobin: 11.5 g/dL — ABNORMAL LOW (ref 13.0–17.0)
MCH: 28.6 pg (ref 26.0–34.0)
MCHC: 33.1 g/dL (ref 30.0–36.0)
MCV: 86.3 fL (ref 78.0–100.0)

## 2013-01-01 MED ORDER — ISOSORBIDE MONONITRATE ER 60 MG PO TB24
60.0000 mg | ORAL_TABLET | Freq: Every day | ORAL | Status: DC
  Administered 2013-01-02 – 2013-01-03 (×2): 60 mg via ORAL
  Filled 2013-01-01 (×2): qty 1

## 2013-01-01 MED ORDER — BISOPROLOL FUMARATE 10 MG PO TABS: 10.0000 mg | ORAL_TABLET | Freq: Every day | ORAL | Status: DC

## 2013-01-01 MED ORDER — HYDRALAZINE HCL 25 MG PO TABS
25.0000 mg | ORAL_TABLET | Freq: Two times a day (BID) | ORAL | Status: DC
  Administered 2013-01-01 – 2013-01-03 (×4): 25 mg via ORAL
  Filled 2013-01-01 (×5): qty 1

## 2013-01-01 MED ORDER — AMIODARONE HCL 200 MG PO TABS
200.0000 mg | ORAL_TABLET | Freq: Every day | ORAL | Status: DC
  Administered 2013-01-02 – 2013-01-03 (×2): 200 mg via ORAL
  Filled 2013-01-01 (×2): qty 1

## 2013-01-01 MED ORDER — BISOPROLOL FUMARATE 5 MG PO TABS
5.0000 mg | ORAL_TABLET | Freq: Every day | ORAL | Status: DC
  Administered 2013-01-02 – 2013-01-03 (×2): 5 mg via ORAL
  Filled 2013-01-01 (×2): qty 1

## 2013-01-01 NOTE — Progress Notes (Addendum)
[facility] PROGRESS NOTE  KORTNEY SCHOENFELDER NWG:[id number] DOB: 08/06/1945 DOA: 12/31/2012 PCP: Ailene Ravel, MD  Assessment/Plan: Acute respiratory failure with hypoxia secondary to HCAP -Continue current empiric antibiotics with vancomycin cefepime and Levaquin, follow and de-escalate as appropriate -Leukocytosis decreasing, Follow and repeat chest x-ray in a.m. -Mildly hypotensive, hold parameters added to antihypertensives, follow. Active Problems:  Chronic diastolic heart failure-as the EF 55%  -Status post ICD -Continue Lasix, closely monitor fluid status and renal fuction -About half a liter neg the past 24 hours Atrial fibrillation  -Continue amiodarone, rate controlled -INR trending down still supratherapeutic, follow, pharmacy following as well Hypothyroidism  -Continue Synthroid TSH within normal limits at 1.93 Hemoptysis -In setting of pneumonia with supratherapeutic INR -Treating pneumonia as above and holding coumadin to allow INR to trend down CKD -Creatinine stable, follow. Code Status:full Family Communication: None at bedside Disposition Plan: Pending clinical course   Consultants:  none  Procedures:  none  Antibiotics:  Vancomycin cefepime and Levaquin started on 6/26  HPI/Subjective: States better today- Decreased hemoptysis and decreased cough, denies shortness of breath.  Objective: Filed Vitals:   01/01/13 1300 01/01/13 1400 01/01/13 1500 01/01/13 1600  BP:    94/55  Pulse: 72 70 69 76  Temp:    98.8 F (37.1 C)  TempSrc:    Oral  Resp: 21 22 24 18   Height:      Weight:      SpO2: 96% 96% 94% 95%    Intake/Output Summary (Last 24 hours) at 01/01/13 1702 Last data filed at 01/01/13 1600  Gross per 24 hour  Intake   1560 ml  Output   2000 ml  Net   -440 ml   Filed Weights   12/31/12 0504 01/01/13 0036  Weight: 116.5 kg (256 lb 13.4 oz) 116 kg (255 lb 11.7 oz)   Exam:  General: alert & oriented x 3 In  NAD Cardiovascular: RRR, nl S1 s2 Respiratory: Scattered rhonchi at bases, no wheezes Abdomen: soft +BS NT/ND, no masses palpable Extremities: No cyanosis and no edema    Data Reviewed: Basic Metabolic Panel:  Recent Labs Lab 12/31/12 0910 01/01/13 0420  NA 136 136  K 3.9 3.8  CL 100 101  CO2 28 28  GLUCOSE 95 102*  BUN 21 24*  CREATININE 1.71* 1.73*  CALCIUM 9.0 9.2   Liver Function Tests:  Recent Labs Lab 12/31/12 0910  AST 18  ALT 19  ALKPHOS 49  BILITOT 1.3*  PROT 6.4  ALBUMIN 3.4*   No results found for this basename: LIPASE, AMYLASE,  in the last 168 hours No results found for this basename: AMMONIA,  in the last 168 hours CBC:  Recent Labs Lab 12/31/12 0910 01/01/13 0420  WBC 14.1* 11.6*  NEUTROABS 11.9*  --   HGB 11.8* 11.5*  HCT 36.2* 34.7*  MCV 85.4 86.3  PLT 204 187   Cardiac Enzymes:  Recent Labs Lab 12/31/12 0910  TROPONINI <0.30   BNP (last 3 results)  Recent Labs  12/08/12 0419 12/12/12 0403 12/31/12 0910  PROBNP 619.8* 209.3* 459.6*   CBG: No results found for this basename: GLUCAP,  in the last 168 hours  Recent Results (from the past 240 hour(s))  MRSA PCR SCREENING     Status: None   Collection Time    12/31/12  5:26 AM      Result Value Range Status   MRSA by PCR NEGATIVE  NEGATIVE Final   Comment:  The GeneXpert MRSA Assay (FDA     approved for NASAL specimens     only), is one component of a     comprehensive MRSA colonization     surveillance program. It is not     intended to diagnose MRSA     infection nor to guide or     monitor treatment for     MRSA infections.     Studies: Dg Chest Port 1 View  12/31/2012   *RADIOLOGY REPORT*  Clinical Data: Short of breath  PORTABLE CHEST - 1 VIEW  Comparison: 0100 hours  Findings: Left subclavian AICD device is stable.  Left lower lobe airspace opacities and to a lesser degree the right lower lobe airspace opacities have improved.  Low volumes.  No  pneumothorax. Small pleural effusions are not significantly changed.  IMPRESSION: Improved bibasilar airspace opacities.   Original Report Authenticated By: Jolaine Click, M.D.    Scheduled Meds: . [START ON 01/02/2013] amiodarone  200 mg Oral Daily  . [START ON 01/02/2013] bisoprolol  10 mg Oral Daily  . budesonide-formoterol  2 puff Inhalation BID  . ceFEPime (MAXIPIME) IV  1 g Intravenous Q12H  . fenofibrate  160 mg Oral Daily  . furosemide  40 mg Oral Daily  . hydrALAZINE  25 mg Oral BID  . [START ON 01/02/2013] isosorbide mononitrate  60 mg Oral Daily  . [START ON 01/02/2013] levofloxacin (LEVAQUIN) IV  750 mg Intravenous Q48H  . levothyroxine  100 mcg Oral QAC breakfast  . loratadine  10 mg Oral q morning - 10a  . potassium chloride SA  20 mEq Oral QODAY  . sodium chloride  3 mL Intravenous Q12H  . sodium chloride  3 mL Intravenous Q12H  . vancomycin  1,500 mg Intravenous Q24H  . Warfarin - Pharmacist Dosing Inpatient   Does not apply q1800   Continuous Infusions:   Principal Problem:   Acute respiratory failure with hypoxia Active Problems:   Atrial fibrillation   Chronic diastolic heart failure   Hypothyroidism   Long-term (current) use of anticoagulants   Pneumonia   Hemoptysis    Time spent: 35    Cleave Ternes C  [facility] Pager [phone]. If 7PM-7AM, please contact night-coverage at [email or web address], password [facility] 01/01/2013, 5:02 PM  LOS: 1 day

## 2013-01-01 NOTE — Progress Notes (Signed)
ANTICOAGULATION CONSULT NOTE - Follow Up Consult  Pharmacy Consult for Coumadin Indication: atrial fibrillation  Allergies  Allergen Reactions  . Methimazole (Thiamazole) Itching and Rash    Severe rash    Patient Measurements: Height: 5\' 5"  (165.1 cm) Weight: 255 lb 11.7 oz (116 kg) IBW/kg (Calculated) : 61.5 Heparin Dosing Weight:   Vital Signs: Temp: 98.7 F (37.1 C) (07/27 0743) Temp src: Oral (07/27 0743) BP: 122/74 mmHg (07/27 0800) Pulse Rate: 68 (07/27 0800)  Labs:  Recent Labs  12/31/12 0910 01/01/13 0420  HGB 11.8* 11.5*  HCT 36.2* 34.7*  PLT 204 187  LABPROT 34.7* 32.0*  INR 3.62* 3.25*  CREATININE 1.71* 1.73*  TROPONINI <0.30  --     Estimated Creatinine Clearance: 48.2 ml/min (by C-G formula based on Cr of 1.73).  Assessment: 66yom on Coumadin for hx Afib. INR (3.25) remains supratherapeutic but trended down with held dose. With recent hemoptysis, will plan to hold Coumadin again tonight to allow INR to decrease into range. - H/H and Plts slight trend down - No significant bleeding reported (no additional hemoptysis reported)  Goal of Therapy:  INR 2-3   Plan:  1. No Coumadin today 2. Follow-up AM INR and s/sx of bleeding  Cleon Dew [phone] 01/01/2013,9:43 AM

## 2013-01-01 NOTE — Progress Notes (Signed)
Utilization Review Completed.Cody Faulkner T7/27/2014  

## 2013-01-02 ENCOUNTER — Inpatient Hospital Stay (HOSPITAL_COMMUNITY): Payer: Medicare Other

## 2013-01-02 LAB — CBC
HCT: 33.2 % — ABNORMAL LOW (ref 39.0–52.0)
Hemoglobin: 11.1 g/dL — ABNORMAL LOW (ref 13.0–17.0)
MCV: 85.8 fL (ref 78.0–100.0)
RDW: 14.8 % (ref 11.5–15.5)
WBC: 7 10*3/uL (ref 4.0–10.5)

## 2013-01-02 LAB — BASIC METABOLIC PANEL
BUN: 26 mg/dL — ABNORMAL HIGH (ref 6–23)
CO2: 29 mEq/L (ref 19–32)
Chloride: 103 mEq/L (ref 96–112)
Creatinine, Ser: 1.42 mg/dL — ABNORMAL HIGH (ref 0.50–1.35)
GFR calc Af Amer: 57 mL/min — ABNORMAL LOW (ref >90)
Glucose, Bld: 93 mg/dL (ref 70–99)

## 2013-01-02 MED ORDER — LEVOFLOXACIN IN D5W 750 MG/150ML IV SOLN
750.0000 mg | INTRAVENOUS | Status: DC
  Administered 2013-01-03: 750 mg via INTRAVENOUS
  Filled 2013-01-02: qty 150

## 2013-01-02 MED ORDER — WARFARIN SODIUM 5 MG PO TABS
5.0000 mg | ORAL_TABLET | Freq: Once | ORAL | Status: AC
  Administered 2013-01-02: 5 mg via ORAL
  Filled 2013-01-02: qty 1

## 2013-01-02 NOTE — Progress Notes (Signed)
Pt received from 2600 alert/oriented. Oriented to room. VS done and placed on telemetry.

## 2013-01-02 NOTE — Progress Notes (Signed)
ANTICOAGULATION CONSULT NOTE - Follow Up Consult  Pharmacy Consult for Coumadin Indication: atrial fibrillation  Allergies  Allergen Reactions  . Methimazole (Thiamazole) Itching and Rash    Severe rash   Labs:  Recent Labs  12/31/12 0910 01/01/13 0420 01/02/13 0644  HGB 11.8* 11.5* 11.1*  HCT 36.2* 34.7* 33.2*  PLT 204 187 184  LABPROT 34.7* 32.0* 23.4*  INR 3.62* 3.25* 2.16*  CREATININE 1.71* 1.73* 1.42*  TROPONINI <0.30  --   --     Estimated Creatinine Clearance: 58.7 ml/min (by C-G formula based on Cr of 1.42).  Assessment: 66yom on Coumadin for hx Afib. INR (2.16) now in therapeutic range.  - H/H and Plts slight trend down - No significant bleeding reported (no additional hemoptysis reported)  Goal of Therapy:  INR 2-3   Plan:  1. Coumadin 5 mg po x 1  2. Follow-up AM INR and s/sx of bleeding  Thank you. Okey Regal, PharmD [phone]  01/02/2013,2:07 PM

## 2013-01-02 NOTE — Progress Notes (Signed)
[facility] PROGRESS NOTE  Cody Faulkner ZOX:[id number] DOB: 04/20/1946 DOA: 12/31/2012 PCP: Ailene Ravel, MD  Assessment/Plan: Acute respiratory failure with hypoxia secondary to HCAP -clinically improving, will de-escalate antibiotics to levaquin -Leukocytosis resolved, repeat chest x-ray this am with some improvement of L>R LL aeration. -BP stable today,will transfer to tele Active Problems:  Chronic diastolic heart failure-as the EF 55%  -Status post ICD -Continue Lasix, renal fuction improving -continuing to diurese Atrial fibrillation  -Continue amiodarone, rate controlled -INR now down to therapeutic range, follow, pharmacy following as well Hypothyroidism  -Continue Synthroid TSH within normal limits at 1.93 Hemoptysis -In setting of pneumonia with supratherapeutic INR -Treating pneumonia as above and holding coumadin to allow INR to trend down -resolving CKD -Creatinine improving, follow. Code Status:full Family Communication: None at bedside Disposition Plan: transfer to tele   Consultants:  none  Procedures:  none  Antibiotics:  Vancomycin cefepime 6/26>>6/28   and Levaquin started on 6/26>>   HPI/Subjective: States better today- Decreased hemoptysis and cough, asking when he can go home  Objective: Filed Vitals:   01/02/13 0005 01/02/13 0413 01/02/13 0837 01/02/13 1231  BP: 108/61 107/65 113/62 113/62  Pulse: 70 70 70 73  Temp: 98.6 F (37 C) 98.7 F (37.1 C) 98.1 F (36.7 C) 98.1 F (36.7 C)  TempSrc: Oral Oral Oral Oral  Resp: 20 23 24 22   Height:      Weight:  116 kg (255 lb 11.7 oz)    SpO2: 94% 93% 95% 96%    Intake/Output Summary (Last 24 hours) at 01/02/13 1318 Last data filed at 01/02/13 1200  Gross per 24 hour  Intake   1090 ml  Output   1951 ml  Net   -861 ml   Filed Weights   12/31/12 0504 01/01/13 0036 01/02/13 0413  Weight: 116.5 kg (256 lb 13.4 oz) 116 kg (255 lb 11.7 oz) 116 kg (255 lb 11.7 oz)   Exam:   General: alert & oriented x 3 In NAD Cardiovascular: RRR, nl S1 s2 Respiratory: few rhonchi in R base, no wheezes Abdomen: soft +BS NT/ND, no masses palpable Extremities: No cyanosis and no edema    Data Reviewed: Basic Metabolic Panel:  Recent Labs Lab 12/31/12 0910 01/01/13 0420 01/02/13 0644  NA 136 136 138  K 3.9 3.8 3.7  CL 100 101 103  CO2 28 28 29   GLUCOSE 95 102* 93  BUN 21 24* 26*  CREATININE 1.71* 1.73* 1.42*  CALCIUM 9.0 9.2 9.0   Liver Function Tests:  Recent Labs Lab 12/31/12 0910  AST 18  ALT 19  ALKPHOS 49  BILITOT 1.3*  PROT 6.4  ALBUMIN 3.4*   No results found for this basename: LIPASE, AMYLASE,  in the last 168 hours No results found for this basename: AMMONIA,  in the last 168 hours CBC:  Recent Labs Lab 12/31/12 0910 01/01/13 0420 01/02/13 0644  WBC 14.1* 11.6* 7.0  NEUTROABS 11.9*  --   --   HGB 11.8* 11.5* 11.1*  HCT 36.2* 34.7* 33.2*  MCV 85.4 86.3 85.8  PLT 204 187 184   Cardiac Enzymes:  Recent Labs Lab 12/31/12 0910  TROPONINI <0.30   BNP (last 3 results)  Recent Labs  12/08/12 0419 12/12/12 0403 12/31/12 0910  PROBNP 619.8* 209.3* 459.6*   CBG: No results found for this basename: GLUCAP,  in the last 168 hours  Recent Results (from the past 240 hour(s))  MRSA PCR SCREENING     Status: None  Collection Time    12/31/12  5:26 AM      Result Value Range Status   MRSA by PCR NEGATIVE  NEGATIVE Final   Comment:            The GeneXpert MRSA Assay (FDA     approved for NASAL specimens     only), is one component of a     comprehensive MRSA colonization     surveillance program. It is not     intended to diagnose MRSA     infection nor to guide or     monitor treatment for     MRSA infections.     Studies: Dg Chest Port 1 View  01/02/2013   *RADIOLOGY REPORT*  Clinical Data: Short of breath  PORTABLE CHEST - 1 VIEW  Comparison: 12/31/2012  Findings: Left AICD is in place.  Mild enlargement of the  cardiac silhouette is stable without overt edema.  Improved aeration at the lung bases is present.  Trace left pleural fluid again noted. Right costophrenic angle omitted from the field of view.  No pneumothorax.  IMPRESSION: Minimally improved left greater than right lower lobe aeration.   Original Report Authenticated By: Christiana Pellant, M.D.    Scheduled Meds: . amiodarone  200 mg Oral Daily  . bisoprolol  5 mg Oral Daily  . budesonide-formoterol  2 puff Inhalation BID  . ceFEPime (MAXIPIME) IV  1 g Intravenous Q12H  . fenofibrate  160 mg Oral Daily  . furosemide  40 mg Oral Daily  . hydrALAZINE  25 mg Oral BID  . isosorbide mononitrate  60 mg Oral Daily  . levofloxacin (LEVAQUIN) IV  750 mg Intravenous Q48H  . levothyroxine  100 mcg Oral QAC breakfast  . loratadine  10 mg Oral q morning - 10a  . potassium chloride SA  20 mEq Oral QODAY  . sodium chloride  3 mL Intravenous Q12H  . sodium chloride  3 mL Intravenous Q12H  . vancomycin  1,500 mg Intravenous Q24H  . Warfarin - Pharmacist Dosing Inpatient   Does not apply q1800   Continuous Infusions:   Principal Problem:   Acute respiratory failure with hypoxia Active Problems:   Atrial fibrillation   Chronic diastolic heart failure   Hypothyroidism   Long-term (current) use of anticoagulants   Pneumonia   Hemoptysis    Time spent: 25    [facility] C  [facility] Pager [phone]. If 7PM-7AM, please contact night-coverage at [email or web address], password [facility] 01/02/2013, 1:18 PM  LOS: 2 days

## 2013-01-03 ENCOUNTER — Inpatient Hospital Stay: Payer: Medicare Other | Admitting: Adult Health

## 2013-01-03 DIAGNOSIS — J9600 Acute respiratory failure, unspecified whether with hypoxia or hypercapnia: Secondary | ICD-10-CM

## 2013-01-03 LAB — PROTIME-INR
INR: 1.84 — ABNORMAL HIGH (ref 0.00–1.49)
Prothrombin Time: 20.7 seconds — ABNORMAL HIGH (ref 11.6–15.2)

## 2013-01-03 LAB — BASIC METABOLIC PANEL
Chloride: 102 mEq/L (ref 96–112)
GFR calc Af Amer: 56 mL/min — ABNORMAL LOW (ref >90)
Potassium: 4 mEq/L (ref 3.5–5.1)
Sodium: 138 mEq/L (ref 135–145)

## 2013-01-03 MED ORDER — WARFARIN SODIUM 7.5 MG PO TABS
7.5000 mg | ORAL_TABLET | Freq: Once | ORAL | Status: AC
  Administered 2013-01-03: 7.5 mg via ORAL
  Filled 2013-01-03: qty 1

## 2013-01-03 MED ORDER — LEVOFLOXACIN 750 MG PO TABS: 750.0000 mg | ORAL_TABLET | Freq: Every day | ORAL | Status: DC

## 2013-01-03 NOTE — Clinical Social Work Psychosocial (Signed)
     Clinical Social Work Department BRIEF PSYCHOSOCIAL ASSESSMENT 01/03/2013  Patient:  Cody Faulkner, Cody Faulkner     Account Number:  [id number]     Admit date:  12/31/2012  Clinical Social Worker:  Tiburcio Pea  Date/Time:  01/03/2013 04:45 PM  Referred by:  Physician  Date Referred:  01/03/2013 Referred for  SNF Placement   Other Referral:   (Return to facility.)   Interview type:  Patient Other interview type:    PSYCHOSOCIAL DATA Living Status:  FACILITY Admitted from facility:  OTHER Level of care:  Skilled Nursing Facility Primary support name:  Cody Faulkner Primary support relationship to patient:  FAMILY Degree of support available:   Cousin-- only family he has- strong support    CURRENT CONCERNS Current Concerns  Post-Acute Placement   Other Concerns:   (return)    SOCIAL WORK ASSESSMENT / PLAN 68 year old male- resident of [facility] [place].  He was admitted to the hospital on 12/31/12 to ICU and was transferred today to 4E. Per MD- patient is stable for d/c today.  OK per Gregary Signs at [facility]; [facility] auth obtained.  Patient wants to return to facility.  Fl2 completed.  DC completed.   Assessment/plan status:  No Further Intervention Required Other assessment/ plan:   Information/referral to community resources:   none    PATIENTS/FAMILYS RESPONSE TO PLAN OF CARE: Patient is alert and oriented- very pleasant gentleman who was pleased that he was returning to the SNF today. Patient stated that his only family is his cousin Cody Faulkner and CSW assisted patient to talk to his cousin via phone re: d/c today.  Patient's nurse was notified of d/c and will call report. No further CSW needs identified. CSW signing off.  [name]. [name], LCSWA  [phone]

## 2013-01-03 NOTE — Evaluation (Signed)
Physical Therapy Evaluation Patient Details Name: JEFFEREY LIPPMANN MRN: [id number] DOB: 1946/01/02 Today's Date: 01/03/2013 Time: 0102-7253 PT Time Calculation (min): 21 min  PT Assessment / Plan / Recommendation History of Present Illness  GWENDOLYN NISHI is a 67 y.o. male who was recently admitted 3 weeks ago for hypercarbic respiratory failure secondary to OSA and associated CHF was brought to the ER at [facility] because of hypoxia and hemoptysis with fever. In the ER patient was found to have temperatures around 102.25F with chest x-ray showing perihilar infiltrates and patient also had corrective cough since yesterday. Patient also complained of hemoptysis. Patient's INR in the ER was found to be around 4. Patient was started on empiric antibiotics and transferred to [facility] at patient's and family's request. ABG done over there revealed a pH of 7.43 with PCO2 of 40 and PO2 of 53. Patient was placed on 3 L oxygen. On my exam patient is not in acute distress and denies any chest pain nausea vomiting abdominal pain or diarrhea.  Clinical Impression  Pt with generalized deconditioning and decreased activity tolerance. Remains appropriate to return to [facility] for rehab to achieve safe mod I function prior to transitioning home.    PT Assessment  Patient needs continued PT services    Follow Up Recommendations  SNF    Does the patient have the potential to tolerate intense rehabilitation      Barriers to Discharge        Equipment Recommendations  None recommended by PT    Recommendations for Other Services     Frequency Min 3X/week    Precautions / Restrictions Precautions Precautions: Fall Restrictions Weight Bearing Restrictions: No   Pertinent Vitals/Pain Denies pain      Mobility  Bed Mobility Bed Mobility: Not assessed Transfers Transfers: Sit to Stand;Stand to Sit Sit to Stand: 4: Min assist;With upper extremity assist;From chair/3-in-1 Stand to Sit: 4:  Min assist;Without upper extremity assist;To chair/3-in-1 Details for Transfer Assistance: pt with good technique, increased time Ambulation/Gait Ambulation/Gait Assistance: 4: Min guard Ambulation Distance (Feet): 100 Feet Assistive device: Rolling walker Ambulation/Gait Assistance Details: v/c's to stay in walker, + SOB on 2 LO2 via [place] Gait Pattern: Step-through pattern;Decreased stride length;Wide base of support Gait velocity: wfl Stairs: No    Exercises     PT Diagnosis: Difficulty walking  PT Problem List: Decreased strength;Decreased range of motion;Decreased activity tolerance;Decreased balance;Decreased mobility;Decreased coordination;Decreased knowledge of use of DME;Decreased safety awareness;Decreased knowledge of precautions;Pain PT Treatment Interventions: DME instruction;Gait training;Stair training;Functional mobility training;Therapeutic activities;Therapeutic exercise;Balance training;Patient/family education     PT Goals(Current goals can be found in the care plan section) Acute Rehab PT Goals Patient Stated Goal: [facility] to [facility] to [facility] so he can get better for home PT Goal Formulation: With patient Time For Goal Achievement: 01/10/13 Potential to Achieve Goals: Good  Visit Information  Last PT Received On: 01/03/13 Assistance Needed: +1 History of Present Illness: GOLDMAN BIRCHALL is a 67 y.o. male who was recently admitted 3 weeks ago for hypercarbic respiratory failure secondary to OSA and associated CHF was brought to the ER at [facility] because of hypoxia and hemoptysis with fever. In the ER patient was found to have temperatures around 102.25F with chest x-ray showing perihilar infiltrates and patient also had corrective cough since yesterday. Patient also complained of hemoptysis. Patient's INR in the ER was found to be around 4. Patient was started on empiric antibiotics and transferred to [facility] at patient's and family's  request. ABG done  over there revealed a pH of 7.43 with PCO2 of 40 and PO2 of 53. Patient was placed on 3 L oxygen. On my exam patient is not in acute distress and denies any chest pain nausea vomiting abdominal pain or diarrhea.       Prior Functioning  Home Living Family/patient expects to be discharged to:: Skilled nursing facility Additional Comments: [facility] was at [facility] and using cane  Prior Function Level of Independence: Needs assistance Gait / Transfers Assistance Needed: using cane ADL's / Homemaking Assistance Needed: pt was bathing/dressing with increased time. pt near [facility]/c from [facility] then had onset of HCAP Communication / Swallowing Assistance Needed: no Communication Communication: HOH Dominant Hand: Right    Cognition  Cognition Arousal/Alertness: Awake/alert Behavior During Therapy: WFL for tasks assessed/performed Overall Cognitive Status: Within Functional Limits for tasks assessed    Extremity/Trunk Assessment Upper Extremity Assessment Upper Extremity Assessment: Overall WFL for tasks assessed Lower Extremity Assessment Lower Extremity Assessment: Generalized weakness Cervical / Trunk Assessment Cervical / Trunk Assessment: Normal   Balance Static Standing Balance Static Standing - Balance Support: No upper extremity supported Static Standing - Level of Assistance: 5: Stand by assistance Static Standing - Comment/# of Minutes: 2 min  End of Session PT - End of Session Equipment Utilized During Treatment: Gait belt;Oxygen Activity Tolerance: Patient tolerated treatment well Patient left: in chair;with call bell/phone within reach Nurse Communication: Mobility status  GP     Marcene Brawn 01/03/2013, 11:11 AM  Lewis Shock, PT, DPT Pager #: [phone] Office #: [phone]

## 2013-01-03 NOTE — Discharge Summary (Addendum)
Physician Discharge Summary  [name] ZOX:[id number] DOB: [birth year] DOA: 12/31/2012  PCP: Ailene Ravel, MD  Admit date: 12/31/2012 Discharge date: 01/03/2013  Time spent: 30 minutes  Recommendations for Outpatient Follow-up:  1. TAKE 1/2 THE DOSE OF COUMADIN WHILE ON ANTIBIOTICS THEN RESUME HIS NORMAL DOSE THE DAY AFTER COMPLETING ANTIBIOTICS 2. Repeat INR in one week and follow very closely 3. Follow up with PCP in 1-2 weks  Discharge Diagnoses:  Principal Problem:   Acute respiratory failure with hypoxia Active Problems:   Atrial fibrillation   Chronic diastolic heart failure   Hypothyroidism   Long-term (current) use of anticoagulants   Pneumonia   Hemoptysis   Discharge Condition: Improved  Diet recommendation: Heart healthy  Filed Weights   01/02/13 0413 01/02/13 1800 01/03/13 0502  Weight: 116 kg (255 lb 11.7 oz) 114.7 kg (252 lb 13.9 oz) 114.579 kg (252 lb 9.6 oz)    History of present illness:  Cody Faulkner is a 67 y.o. male who was recently admitted 3 weeks ago for hypercarbic respiratory failure secondary to OSA and associated CHF was brought to the ER at [facility] because of hypoxia and hemoptysis with fever. In the ER patient was found to have temperatures around 102.12F with chest x-ray showing perihilar infiltrates and patient also had corrective cough since yesterday. Patient also complained of hemoptysis. Patient's INR in the ER was found to be around 4. Patient was started on empiric antibiotics and transferred to [facility] at patient's and family's request. ABG done over there revealed a pH of 7.43 with PCO2 of 40 and PO2 of 53. Patient was placed on 3 L oxygen. On my exam patient is not in acute distress and denies any chest pain nausea vomiting abdominal pain or diarrhea.  Hospital Course:  Acute respiratory failure with hypoxia secondary to HCAP  -the patient was started empirically on vanc and cefepime -the patient clinically improved  with decreased O2 requirements -antibiotics were de-escalated to levaquin -Leukocytosis resolved, repeat chest x-ray this am with some improvement of L>R LL aeration.   Chronic diastolic heart failure-as the EF 55%  -Status post ICD  -Continued Lasix, renal fuction improved  Atrial fibrillation  -Continued amiodarone, remained rate controlled  -INR did become supertherapeutic, but had trended to near therapeutic range  Hypothyroidism  -Continued on Synthroid -TSH was within normal limits at 1.93 Hemoptysis  -In setting of pneumonia with supratherapeutic INR  -Treating pneumonia as above and holding coumadin to allow INR to trend down  -resolving  CKD  -Creatinine improving, follow.  Discharge Exam: Filed Vitals:   01/02/13 2217 01/03/13 0502 01/03/13 0736 01/03/13 1019  BP: 125/68 119/73  124/68  Pulse: 70 71    Temp: 98.7 F (37.1 C) 98.5 F (36.9 C)    TempSrc: Oral Oral    Resp: 20 20    Height:      Weight:  114.579 kg (252 lb 9.6 oz)    SpO2: 96% 96% 94%     General: Awake, in nad Cardiovascular: regular, s1, s2 Respiratory:  Normal resp effort, no wheezing  Discharge Instructions     Medication List         acetaminophen 500 MG tablet  Commonly known as:  TYLENOL  Take 500 mg by mouth every 6 (six) hours as needed.     albuterol 108 (90 BASE) MCG/ACT inhaler  Commonly known as:  PROVENTIL HFA;VENTOLIN HFA  Inhale 2 puffs into the lungs every 4 (four) hours as needed.  For shortness of breath     amiodarone 200 MG tablet  Commonly known as:  PACERONE  Take 200 mg by mouth 2 (two) times daily.     aspirin 81 MG chewable tablet  Chew 81 mg by mouth daily.     bisoprolol 10 MG tablet  Commonly known as:  ZEBETA  Take 10 mg by mouth daily.     budesonide-formoterol 160-4.5 MCG/ACT inhaler  Commonly known as:  SYMBICORT  Inhale 2 puffs into the lungs 2 (two) times daily.     fenofibrate micronized 134 MG capsule  Commonly known as:  LOFIBRA  Take  134 mg by mouth daily before breakfast.     furosemide 40 MG tablet  Commonly known as:  LASIX  Take 40 mg by mouth daily.     hydrALAZINE 25 MG tablet  Commonly known as:  APRESOLINE  Take 25 mg by mouth 2 (two) times daily.     isosorbide mononitrate 60 MG 24 hr tablet  Commonly known as:  IMDUR  Take 60 mg by mouth daily.     levofloxacin 750 MG tablet  Commonly known as:  LEVAQUIN  Take 1 tablet (750 mg total) by mouth daily.     levothyroxine 100 MCG tablet  Commonly known as:  SYNTHROID, LEVOTHROID  Take 100 mcg by mouth daily.     loratadine 10 MG tablet  Commonly known as:  CLARITIN  Take 10 mg by mouth every morning.     potassium chloride SA 20 MEQ tablet  Commonly known as:  K-DUR,KLOR-CON  Take 20 mEq by mouth every other day.     warfarin 5 MG tablet  Commonly known as:  COUMADIN  - Take 5-7.5 mg by mouth daily. Monday, Wednesday, and Friday, take 7.5 mg (one and a half tablets)  - Tuesday, Thursday, Saturday, Sunday, take 5 mg (one tablet)       Allergies  Allergen Reactions  . Methimazole (Thiamazole) Itching and Rash    Severe rash   Follow-up Information   Follow up with [facility], MD. Schedule an appointment as soon as possible for a visit in 1 week.   Contact information:   Dr. Burnell Blanks [address] [place] [address] [phone]       Follow up with Repeat coumadin level (INR) within 1 week In 1 week.       The results of significant diagnostics from this hospitalization (including imaging, microbiology, ancillary and laboratory) are listed below for reference.    Significant Diagnostic Studies: Dg Chest 2 View  12/12/2012   *RADIOLOGY REPORT*  Clinical Data: Dyspnea.  CHEST - 2 VIEW  Comparison: 12/11/2012  Findings: AICD unchanged.  Cardiomegaly noted with indistinct pulmonary vasculature, interstitial accentuation, and mild but increased perihilar airspace opacity.  Blunted right costophrenic angle.  Suspected  small layering pleural effusions on the lateral projection.  IMPRESSION:  1.  Mildly worsened congestive heart failure with small bilateral pleural effusions.   Original Report Authenticated By: Gaylyn Rong, M.D.   Dg Chest 2 View  12/11/2012   *RADIOLOGY REPORT*  Clinical Data: Follow-up congestive heart failure.  CHEST - 2 VIEW  Comparison: 12/10/2012  Findings: Stable appearance of cardiac pacemaker since previous study.  Mild cardiac enlargement with mild pulmonary vascular congestion is seen previously.  Mild perihilar infiltration suggesting edema.  Possible small bilateral pleural effusions.  No focal consolidation.  No pneumothorax.  Tortuous aorta.  Azygos lobe.  No significant change since previous study.  IMPRESSION: Mild cardiac enlargement and pulmonary vascular congestion with perihilar infiltrates and small pleural effusions.  No significant progression.   Original Report Authenticated By: Burman Nieves, M.D.   Ct Head Wo Contrast  12/08/2012   *RADIOLOGY REPORT*  Clinical Data: Somnolence, exclude intracranial bleed.  CT HEAD WITHOUT CONTRAST  Technique:  Contiguous axial images were obtained from the base of the skull through the vertex without contrast.  Comparison: None.  Findings: No skull fracture is noted.  Paranasal sinuses and mastoid air cells are unremarkable.  No intracranial hemorrhage, mass effect or midline shift.  No mass lesion is noted on this unenhanced scan.  Paranasal sinuses and mastoid air cells are unremarkable.  Mild cerebral atrophy.  No acute infarction.  No mass lesion is noted on this unenhanced scan.  IMPRESSION: No acute intracranial abnormality.  Mild cerebral atrophy.   Original Report Authenticated By: Natasha Mead, M.D.   Dg Chest Port 1 View  01/02/2013   *RADIOLOGY REPORT*  Clinical Data: Short of breath  PORTABLE CHEST - 1 VIEW  Comparison: 12/31/2012  Findings: Left AICD is in place.  Mild enlargement of the cardiac silhouette is stable without overt  edema.  Improved aeration at the lung bases is present.  Trace left pleural fluid again noted. Right costophrenic angle omitted from the field of view.  No pneumothorax.  IMPRESSION: Minimally improved left greater than right lower lobe aeration.   Original Report Authenticated By: Christiana Pellant, M.D.   Dg Chest Port 1 View  12/31/2012   *RADIOLOGY REPORT*  Clinical Data: Short of breath  PORTABLE CHEST - 1 VIEW  Comparison: 0100 hours  Findings: Left subclavian AICD device is stable.  Left lower lobe airspace opacities and to a lesser degree the right lower lobe airspace opacities have improved.  Low volumes.  No pneumothorax. Small pleural effusions are not significantly changed.  IMPRESSION: Improved bibasilar airspace opacities.   Original Report Authenticated By: Jolaine Click, M.D.   Dg Chest Port 1 View  12/10/2012   *RADIOLOGY REPORT*  Clinical Data: Assess edema  PORTABLE CHEST - 1 VIEW  Comparison: 12/09/2012  Findings: There is a left chest wall ICD with leads in the right atrial appendage and right ventricle.  Mild cardiac enlargement is stable.  Bilateral, basilar opacities are unchanged from previous exam.  IMPRESSION:  1.  No change in bibasilar airspace opacities.   Original Report Authenticated By: Signa Kell, M.D.   Dg Chest Port 1 View  12/09/2012   *RADIOLOGY REPORT*  Clinical Data: Respiratory failure  PORTABLE CHEST - 1 VIEW  Comparison: Prior chest x-ray 12/08/2012  Findings: Stable position of left subclavian approach cardiac rhythm maintenance device with leads projecting over the right atrium and right ventricle.  Stable cardiomegaly.  Probable bilateral layering effusions and associated bibasilar opacities. The degree of pulmonary vascular congestion and edema has improved. No pneumothorax.  Azygos fissure in the right upper lobe.  IMPRESSION:  1.  Improving pulmonary edema. 2.  Persistent bibasilar opacities likely reflecting a combination of layering pleural effusions with  atelectasis and / or infiltrate. 3.  Stable cardiomegaly   Original Report Authenticated By: Malachy Moan, M.D.   Dg Chest Port 1 View  12/08/2012   *RADIOLOGY REPORT*  Clinical Data: Shortness of breath.  Evaluate for airspace disease.  PORTABLE CHEST - 1 VIEW  Comparison: 12/07/2012.  Findings: Stable moderate cardiac silhouette enlargement is evident.  Multiple lead AICD is in place with controller device on the left. Ectasia and nonaneurysmal calcification  of the thoracic aorta are seen.  Mediastinal and hilar contours appear stable. There is vascular congestion pattern.  Haziness and patchy pulmonary infiltrative densities are seen in the perihilar region and in both lung bases.  This could reflect edema and / or pneumonia.  Indistinctness of costophrenic angles may reflect atelectasis and pleural effusions.  Osteophytes are present in the spine.  IMPRESSION: Stable cardiac silhouette enlargement.  AICD in place. There is vascular congestion pattern.  Haziness and patchy pulmonary infiltrative densities are seen in the perihilar region and in both lung bases.  This could reflect edema and / or pneumonia. Indistinctness of costophrenic angles may reflect atelectasis and pleural effusions.   Original Report Authenticated By: Onalee Hua Call   Dg Chest Port 1 View  12/07/2012   *RADIOLOGY REPORT*  Clinical Data: Shortness of breath, weakness  PORTABLE CHEST - 1 VIEW  Comparison: Chest x-ray of 07/14/2012  Findings: Moderate cardiomegaly is again noted with defibrillator leads.  There is perihilar haziness most consistent with mild edema.  A small right effusion cannot be excluded.  IMPRESSION: Cardiomegaly and mild edema.   Original Report Authenticated By: Dwyane Dee, M.D.    Microbiology: Recent Results (from the past 240 hour(s))  MRSA PCR SCREENING     Status: None   Collection Time    12/31/12  5:26 AM      Result Value Range Status   MRSA by PCR NEGATIVE  NEGATIVE Final   Comment:             The GeneXpert MRSA Assay (FDA     approved for NASAL specimens     only), is one component of a     comprehensive MRSA colonization     surveillance program. It is not     intended to diagnose MRSA     infection nor to guide or     monitor treatment for     MRSA infections.     Labs: Basic Metabolic Panel:  Recent Labs Lab 12/31/12 0910 01/01/13 0420 01/02/13 0644 01/03/13 0425  NA 136 136 138 138  K 3.9 3.8 3.7 4.0  CL 100 101 103 102  CO2 28 28 29 29   GLUCOSE 95 102* 93 88  BUN 21 24* 26* 28*  CREATININE 1.71* 1.73* 1.42* 1.45*  CALCIUM 9.0 9.2 9.0 9.2   Liver Function Tests:  Recent Labs Lab 12/31/12 0910  AST 18  ALT 19  ALKPHOS 49  BILITOT 1.3*  PROT 6.4  ALBUMIN 3.4*   No results found for this basename: LIPASE, AMYLASE,  in the last 168 hours No results found for this basename: AMMONIA,  in the last 168 hours CBC:  Recent Labs Lab 12/31/12 0910 01/01/13 0420 01/02/13 0644  WBC 14.1* 11.6* 7.0  NEUTROABS 11.9*  --   --   HGB 11.8* 11.5* 11.1*  HCT 36.2* 34.7* 33.2*  MCV 85.4 86.3 85.8  PLT 204 187 184   Cardiac Enzymes:  Recent Labs Lab 12/31/12 0910  TROPONINI <0.30   BNP: BNP (last 3 results)  Recent Labs  12/08/12 0419 12/12/12 0403 12/31/12 0910  PROBNP 619.8* 209.3* 459.6*   CBG: No results found for this basename: GLUCAP,  in the last 168 hours   Signed:  CHIU, STEPHEN K  [facility] 01/03/2013, 11:48 AM

## 2013-01-03 NOTE — Progress Notes (Signed)
ANTICOAGULATION CONSULT NOTE - Follow Up Consult  Pharmacy Consult for Coumadin Indication: atrial fibrillation  Allergies  Allergen Reactions  . Methimazole (Thiamazole) Itching and Rash    Severe rash   Labs:  Recent Labs  01/01/13 0420 01/02/13 0644 01/03/13 0425  HGB 11.5* 11.1*  --   HCT 34.7* 33.2*  --   PLT 187 184  --   LABPROT 32.0* 23.4* 20.7*  INR 3.25* 2.16* 1.84*  CREATININE 1.73* 1.42* 1.45*    Estimated Creatinine Clearance: 57 ml/min (by C-G formula based on Cr of 1.45).  Assessment: 66yom on Coumadin for hx Afib. INR 1.84 decresing after doses held when INR 3.6   CBC stable  - No significant bleeding reported (no additional hemoptysis reported)  Goal of Therapy:  INR 2-3   Plan:  1. Coumadin 7.5 mg po x 1  2. Follow-up AM INR and s/sx of bleeding  Leota Sauers Pharm.D. CPP, BCPS Clinical Pharmacist [phone] 01/03/2013 10:38 AM

## 2013-01-03 NOTE — Evaluation (Signed)
Occupational Therapy Evaluation Patient Details Name: Cody Faulkner MRN: [id number] DOB: [birth year] Today's Date: 01/03/2013 Time: 1020-1039 OT Time Calculation (min): 19 min  OT Assessment / Plan / Recommendation History of present illness Cody Faulkner is a 67 y.o. male who was recently admitted 3 weeks ago for hypercarbic respiratory failure secondary to OSA and associated CHF was brought to the ER at [facility] because of hypoxia and hemoptysis with fever. In the ER patient was found to have temperatures around 102.247F with chest x-ray showing perihilar infiltrates and patient also had corrective cough since yesterday. Patient also complained of hemoptysis. Patient's INR in the ER was found to be around 4. Patient was started on empiric antibiotics and transferred to [facility] at patient's and family's request. ABG done over there revealed a pH of 7.43 with PCO2 of 40 and PO2 of 53. Patient was placed on 3 L oxygen. On my exam patient is not in acute distress and denies any chest pain nausea vomiting abdominal pain or diarrhea.   Clinical Impression   Pt previously at SNF participating in rehab in prep to return home. Pt continues to demo deficits in ADLs and ADL mobility and would benefit from continued rehab at [facility]. No further acute OT needs indicated at this time    OT Assessment  All further OT needs can be met in the next venue of care    Follow Up Recommendations  SNF    Barriers to Discharge Decreased caregiver support    Equipment Recommendations  None recommended by OT    Recommendations for Other Services    Frequency     Precautions / Restrictions Precautions Precautions: Fall Restrictions Weight Bearing Restrictions: No Other Position/Activity Restrictions: Very painful L knee with Wbing   Pertinent Vitals/Pain No c/o pain    ADL  Grooming: Performed;Wash/dry hands;Wash/dry face;Min guard Where Assessed - Grooming: Supported standing Upper Body Bathing:  Simulated;Supervision/safety;Set up Lower Body Bathing: Moderate assistance;Minimal assistance Upper Body Dressing: Performed;Supervision/safety;Set up Lower Body Dressing: Performed;Moderate assistance;Minimal assistance Toilet Transfer: Performed;Minimal assistance Toilet Transfer Method: Sit to stand Toilet Transfer Equipment: Raised toilet seat with arms (or 3-in-1 over toilet) Toileting - Clothing Manipulation and Hygiene: Performed;Minimal assistance Where Assessed - Toileting Clothing Manipulation and Hygiene: Standing Tub/Shower Transfer Method: Not assessed ADL Comments: Pt at SNF for therapy prior to admission. pt plans to retrun home after rehab    OT Diagnosis: Generalized weakness  OT Problem List: Decreased strength;Decreased activity tolerance;Decreased knowledge of use of DME or AE;Decreased knowledge of precautions;Cardiopulmonary status limiting activity;Obesity OT Treatment Interventions: Self-care/ADL training;Therapeutic exercise;Therapeutic activities;Patient/family education;DME and/or AE instruction;Energy conservation   OT Goals(Current goals can be found in the care plan section) Acute Rehab OT Goals Patient Stated Goal: [facility] to [facility] to [facility] so he can get better for home  Visit Information  Last OT Received On: 01/03/13 Assistance Needed: +1 PT/OT Co-Evaluation/Treatment: Yes History of Present Illness: Cody Faulkner is a 67 y.o. male who was recently admitted 3 weeks ago for hypercarbic respiratory failure secondary to OSA and associated CHF was brought to the ER at [facility] because of hypoxia and hemoptysis with fever. In the ER patient was found to have temperatures around 102.247F with chest x-ray showing perihilar infiltrates and patient also had corrective cough since yesterday. Patient also complained of hemoptysis. Patient's INR in the ER was found to be around 4. Patient was started on empiric antibiotics and transferred to [facility] at  patient's and family's request. ABG done  over there revealed a pH of 7.43 with PCO2 of 40 and PO2 of 53. Patient was placed on 3 L oxygen. On my exam patient is not in acute distress and denies any chest pain nausea vomiting abdominal pain or diarrhea.       Prior Functioning     Home Living Family/patient expects to be discharged to:: Skilled nursing facility Living Arrangements: Alone Available Help at Discharge: Family;Available PRN/intermittently Type of Home: Mobile home Home Access: Stairs to enter Entrance Stairs-Number of Steps: 3-5 Entrance Stairs-Rails: Right;Left;Can reach both Home Layout: One level Home Equipment: Walker - 2 wheels Additional Comments: [facility] was at [facility] and using cane  Prior Function Level of Independence: Needs assistance Gait / Transfers Assistance Needed: using cane ADL's / Homemaking Assistance Needed: pt was bathing/dressing with increased time. pt near [facility]/c from [facility] then had onset of HCAP Communication / Swallowing Assistance Needed: no Communication Communication: HOH Dominant Hand: Right         Vision/Perception Vision - History Baseline Vision: Wears glasses only for reading Patient Visual Report: No change from baseline Perception Perception: Within Functional Limits   Cognition  Cognition Arousal/Alertness: Awake/alert Behavior During Therapy: WFL for tasks assessed/performed Overall Cognitive Status: Within Functional Limits for tasks assessed    Extremity/Trunk Assessment Upper Extremity Assessment Upper Extremity Assessment: Overall WFL for tasks assessed Lower Extremity Assessment Lower Extremity Assessment: Generalized weakness Cervical / Trunk Assessment Cervical / Trunk Assessment: Normal     Mobility Bed Mobility Bed Mobility: Not assessed Transfers Transfers: Sit to Stand;Stand to Sit Sit to Stand: 4: Min assist;With upper extremity assist;From chair/3-in-1 Stand to Sit: 4: Min assist;Without  upper extremity assist;To chair/3-in-1 Details for Transfer Assistance: pt with good technique, increased time     Exercise     Balance Balance Balance Assessed: Yes Static Standing Balance Static Standing - Balance Support: No upper extremity supported Static Standing - Level of Assistance: 5: Stand by assistance Static Standing - Comment/# of Minutes: 2 min Dynamic Standing Balance Dynamic Standing - Balance Support: No upper extremity supported;During functional activity Dynamic Standing - Level of Assistance: 5: Stand by assistance;4: Min assist   End of Session OT - End of Session Equipment Utilized During Treatment: Gait belt;Rolling walker;Other (comment) (3 in 1 over toilet) Activity Tolerance: Patient tolerated treatment well Patient left: in chair;with call bell/phone within reach  GO     Cody Faulkner 01/03/2013, 11:19 AM

## 2013-01-03 NOTE — Progress Notes (Signed)
Called erport to [facility] care facility in [place] , [place] spoke with Lamonte Richer.

## 2013-01-03 NOTE — Plan of Care (Signed)
Problem: Phase II Progression Outcomes Goal: Discharge plan established Recommend pt return to SNF for continued therapy after acute care d/c

## 2013-03-04 IMAGING — CR DG CHEST 1V PORT
1 series · 1 of 1 positions shown · non-contrast
Comparison: 06/07/2011

CLINICAL DATA: Ventilator support.  Endotracheal placement.

PORTABLE CHEST - 1 VIEW

[AP]
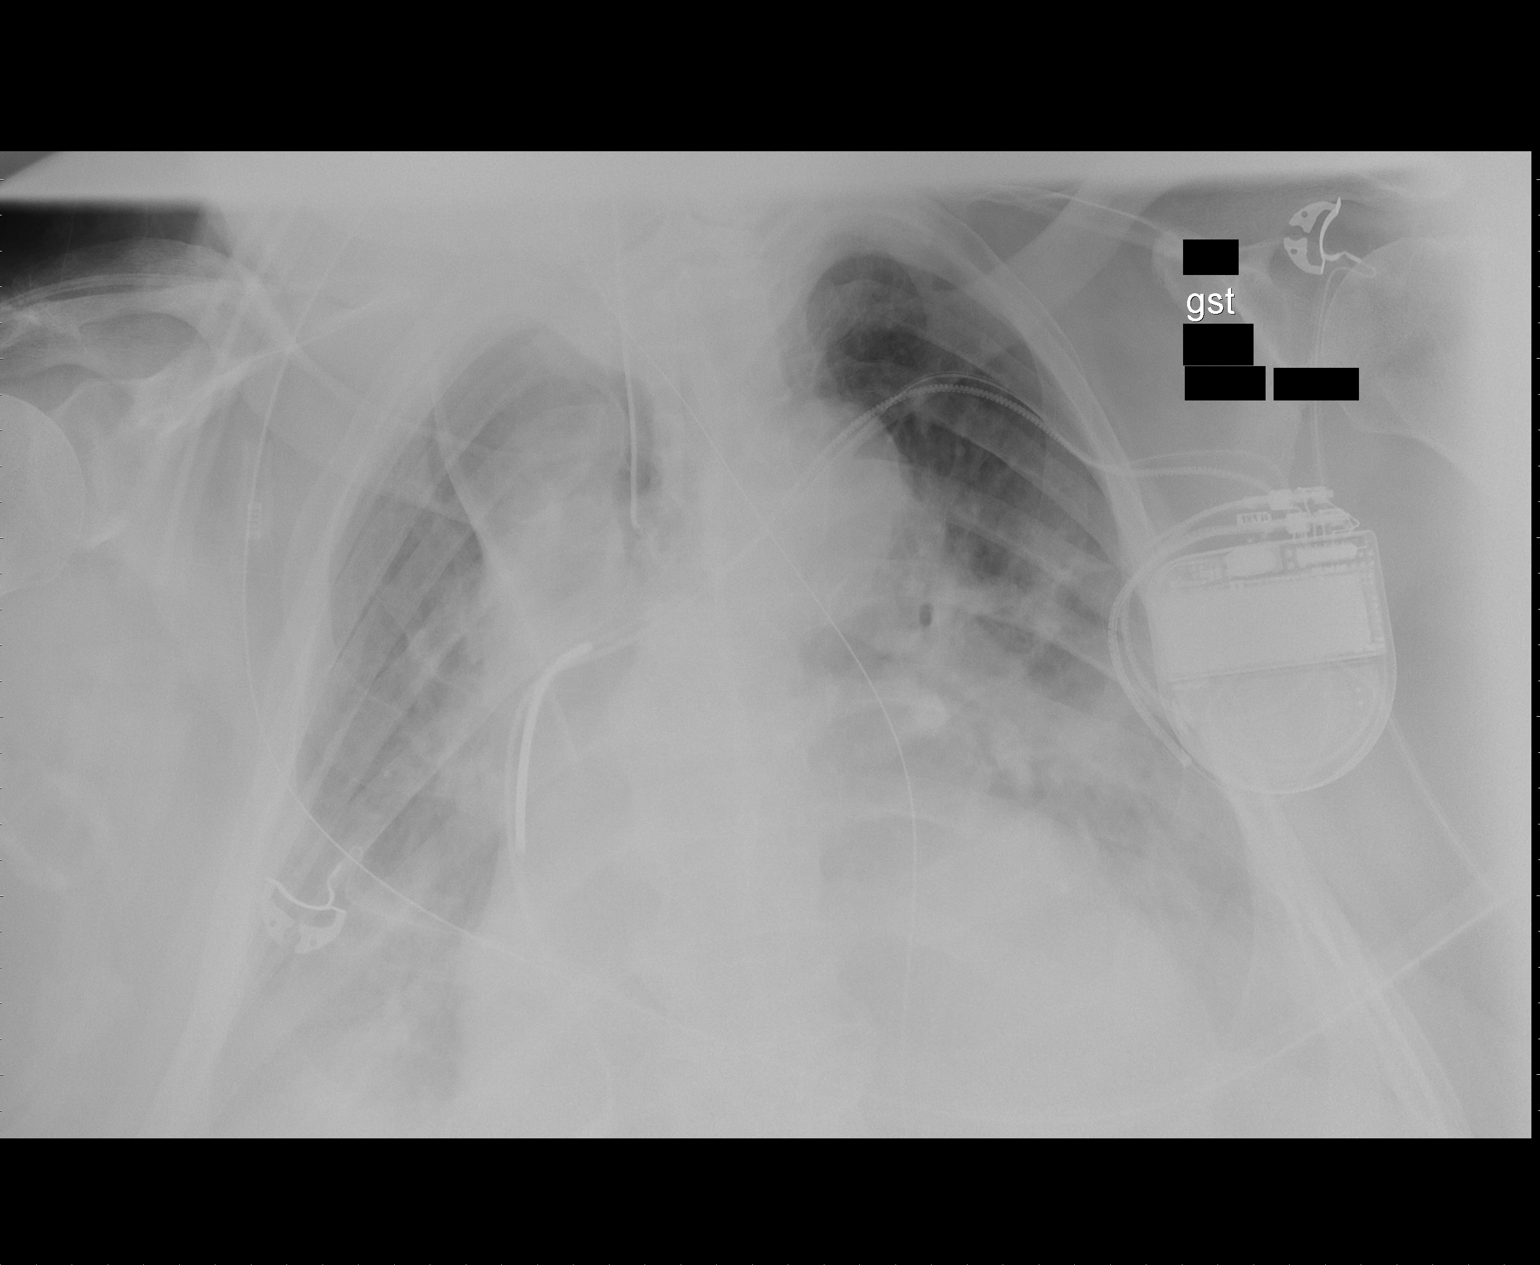

[1 of 1 positions shown; findings below may reference images not displayed]

FINDINGS: Endotracheal tube has its tip to centimeters above the
carina.  Pacemaker/AICD remains in place.  Nasogastric tube enters
the abdomen.  Right internal jugular line has its tip in the SVC
above the right atrium.  This film is not well penetrated.  Diffuse
pulmonary infiltrates persist, similar allowing for technical
differences.
IMPRESSION: No change.  Diffuse pulmonary infiltrates.  Today's film less well
penetrated.

## 2013-03-07 IMAGING — CR DG CHEST 1V PORT
1 series · 1 of 1 positions shown · non-contrast
Comparison: Chest x-ray 06/10/2011.

CLINICAL DATA: Pulmonary infiltrates.

PORTABLE CHEST - 1 VIEW

[AP]
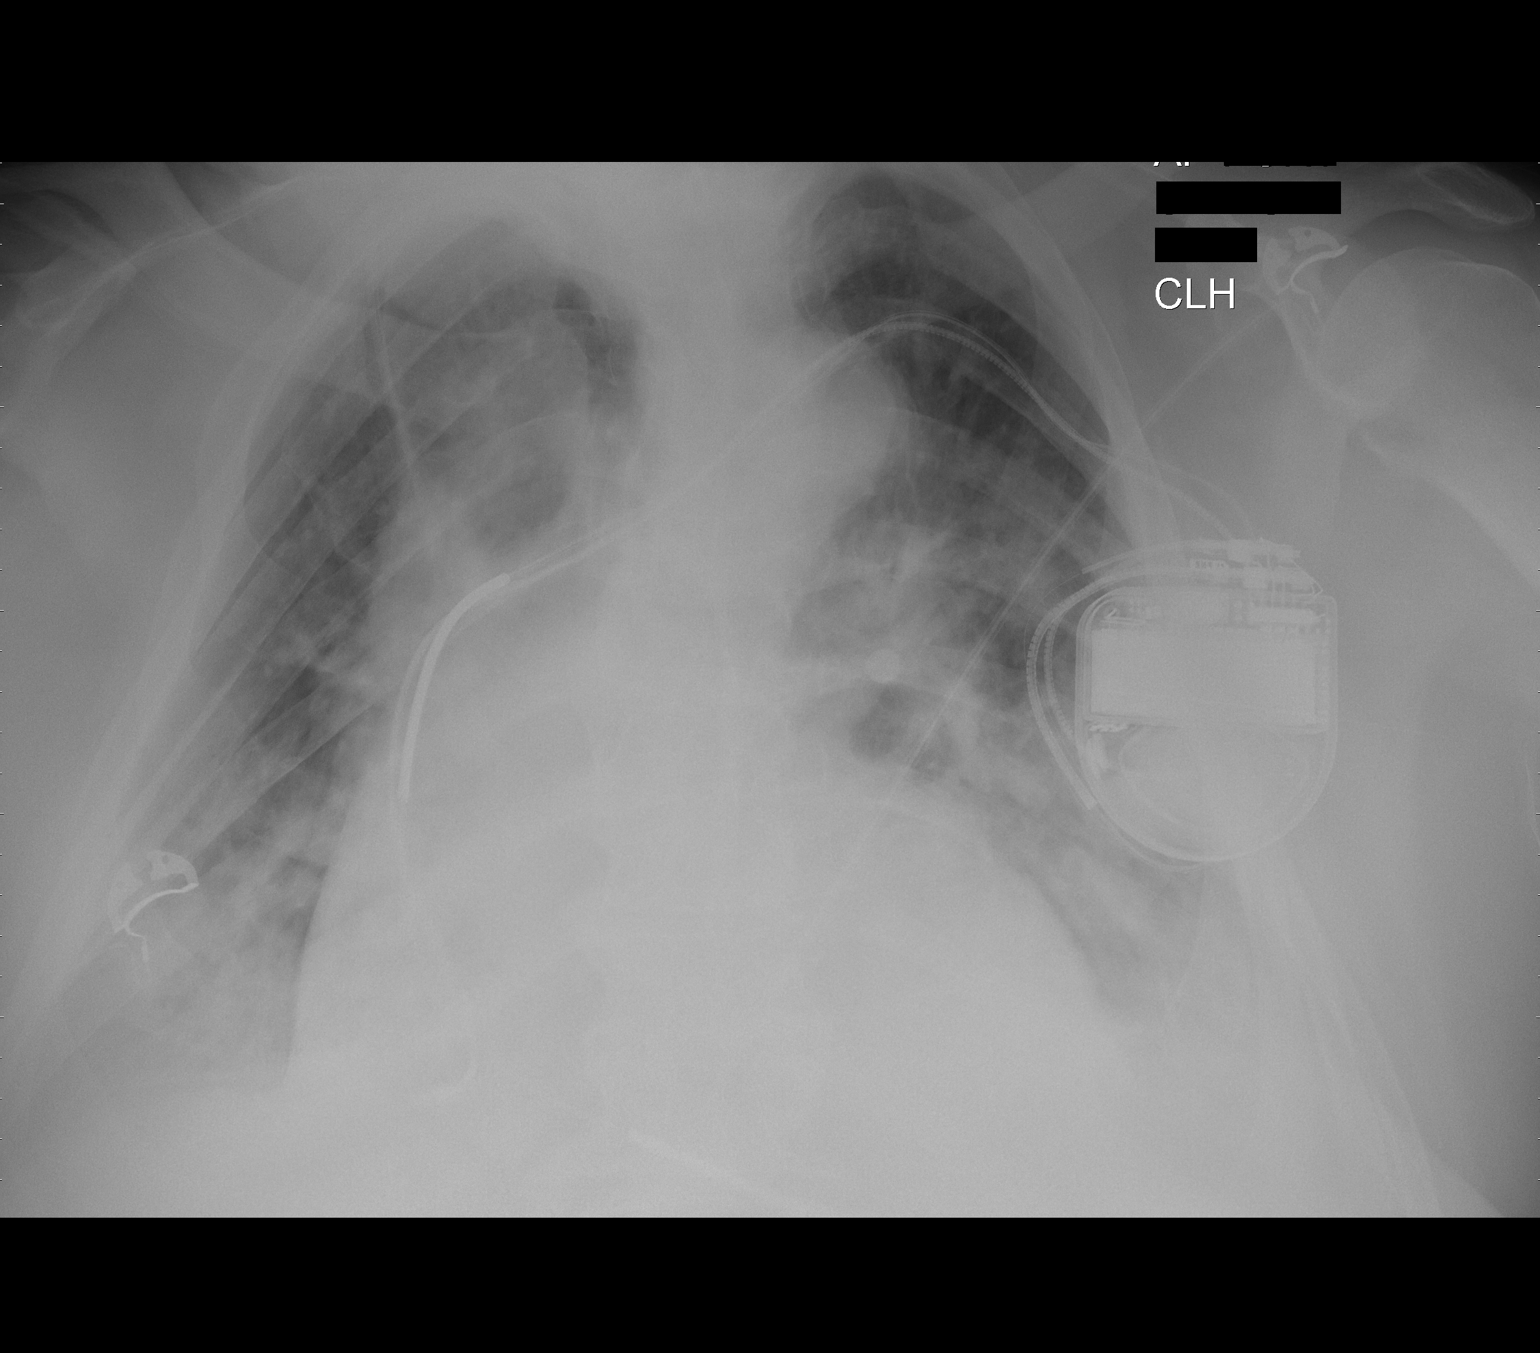

[1 of 1 positions shown; findings below may reference images not displayed]

FINDINGS: The endotracheal tube and NG tube have been removed.  The
right IJ catheter is stable.  Persistent cardiac enlargement with
asymmetric bilateral airspace process, likely infiltrates.
Bilateral pleural effusions persist.  No pneumothorax.
IMPRESSION: 1.  Removal of ET and NG tubes.
2.  Persistent cardiac enlargement, vascular congestion and
bilateral airspace process.

## 2013-03-08 IMAGING — CR DG CHEST 1V PORT
1 series · 1 of 1 positions shown · non-contrast
Comparison: Portable chest x-ray of 06/11/2011

CLINICAL DATA: Shortness of breath, atrial fibrillation

PORTABLE CHEST - 1 VIEW

[view not recorded]
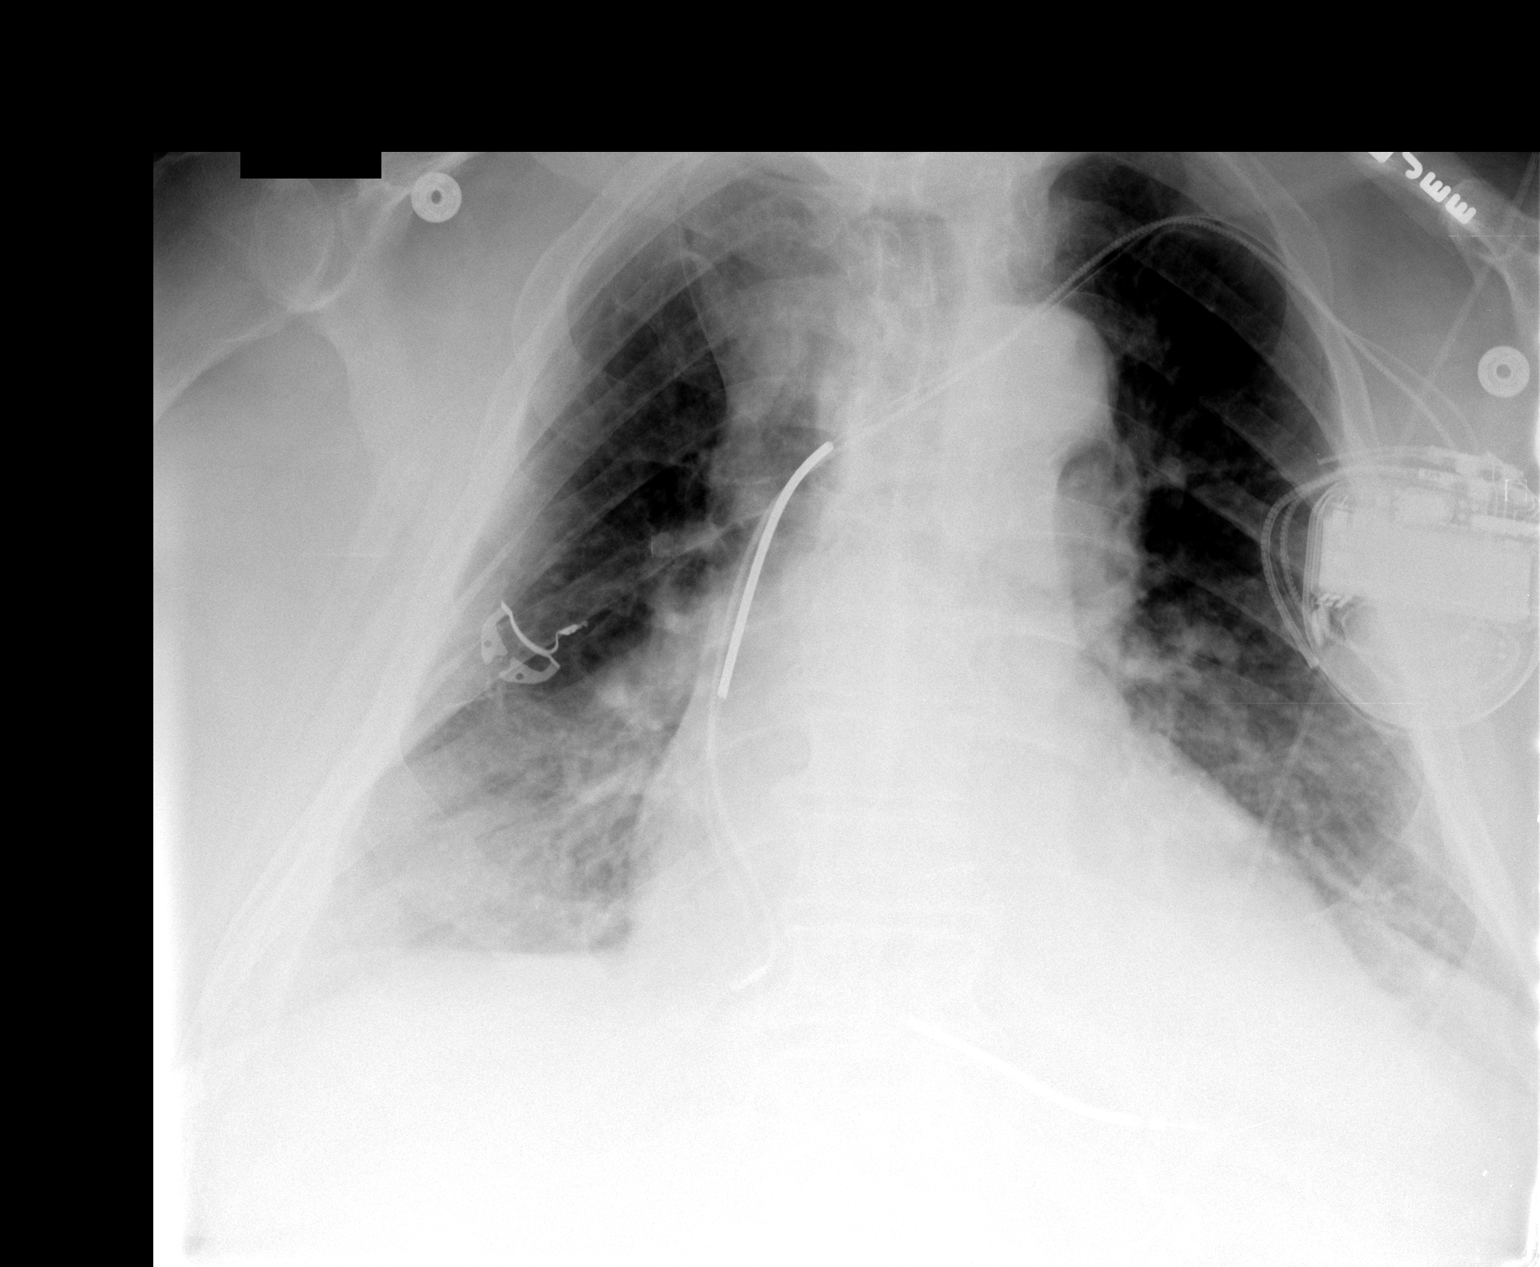

[1 of 1 positions shown; findings below may reference images not displayed]

FINDINGS: Aeration has improved.  There is still patchy airspace
disease at the lung bases.  Small effusions may be present.
Cardiomegaly is stable.  A pacemaker with AICD leads remains.
IMPRESSION: 1.  Improved aeration.
2.  Persistent opacity at the lung bases.
3.  Stable cardiomegaly.

## 2013-03-19 IMAGING — CR DG CHEST 1V PORT
1 series · 1 of 1 positions shown · non-contrast
Comparison: 06/18/2011

CLINICAL DATA: chest pain, shortness of breath

PORTABLE CHEST - 1 VIEW

[AP]
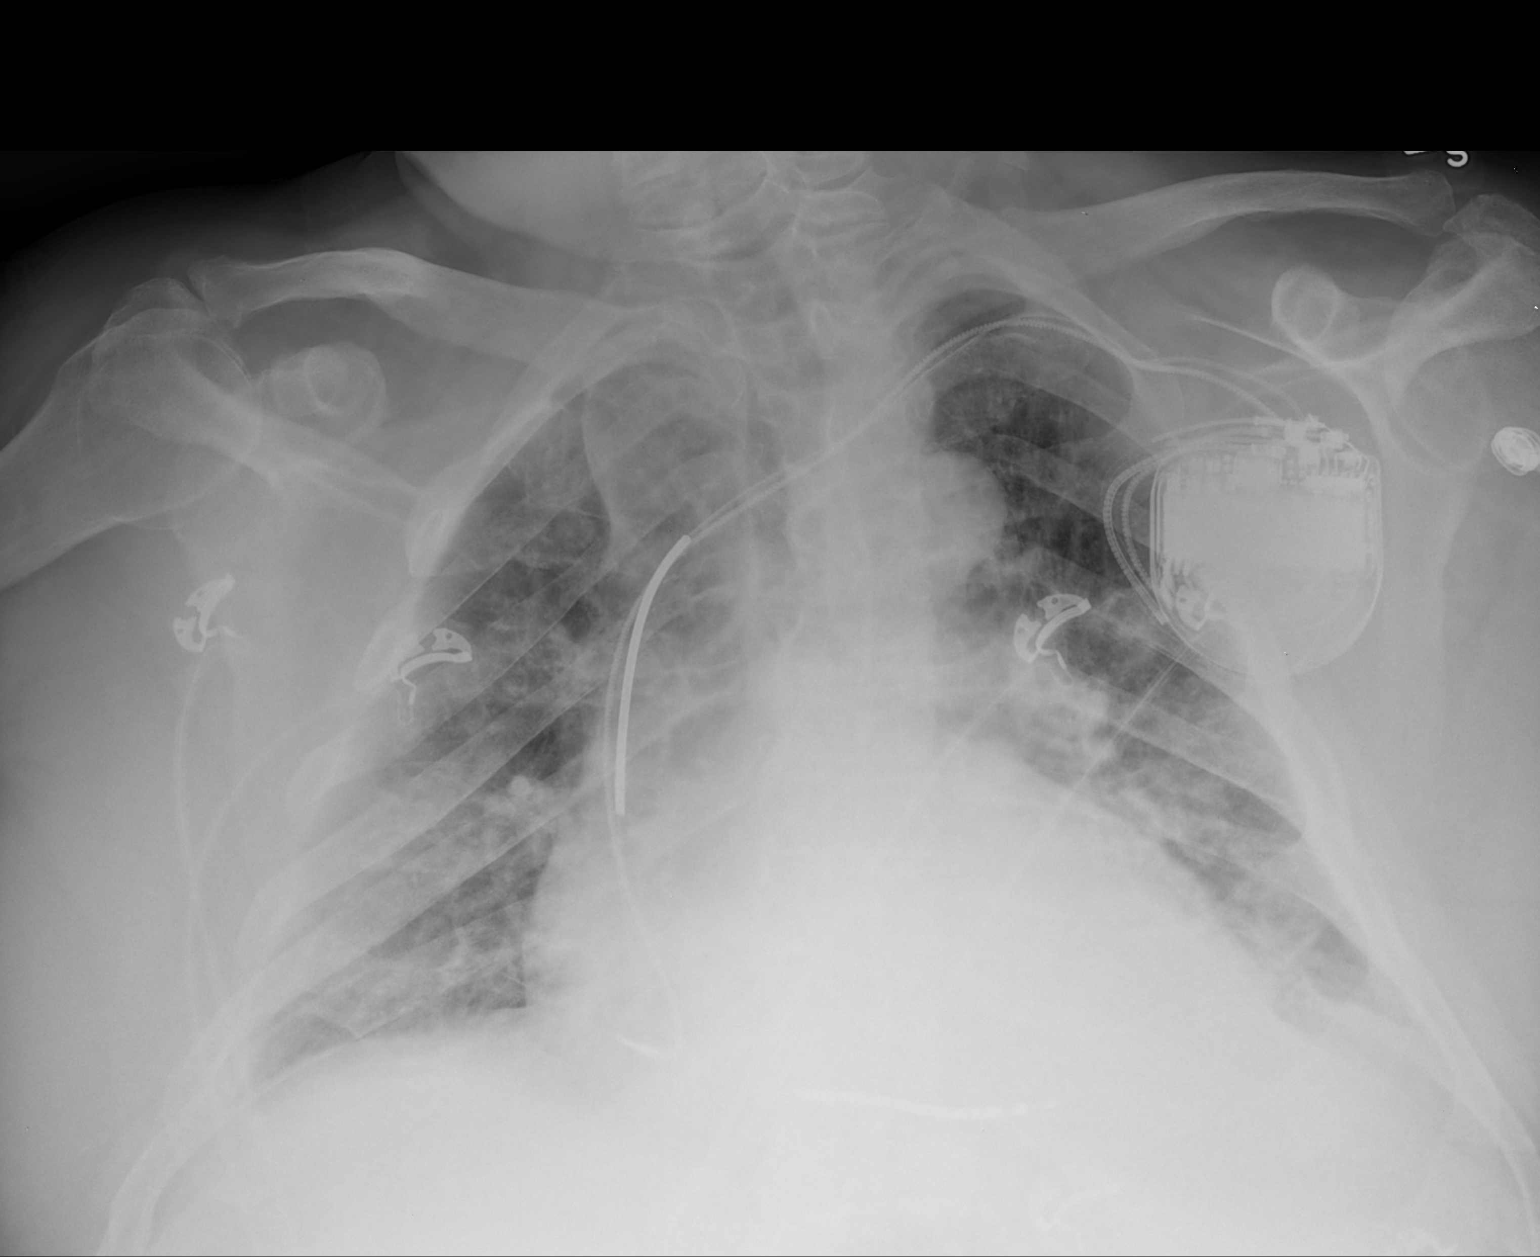

[1 of 1 positions shown; findings below may reference images not displayed]

FINDINGS: Left subclavian AICD / pacer noted.  Heart is enlarged
with vascular congestion versus edema.  Increased left lower lobe
retrocardiac atelectasis / consolidation.  Left base pneumonia not
excluded.  No effusion or pneumothorax.
IMPRESSION: Cardiomegaly with vascular congestion versus mild edema.

Increased left lower lobe atelectasis / consolidation.

## 2013-03-22 ENCOUNTER — Other Ambulatory Visit: Payer: Self-pay | Admitting: Internal Medicine

## 2013-03-25 ENCOUNTER — Other Ambulatory Visit: Payer: Self-pay | Admitting: Internal Medicine

## 2013-03-27 ENCOUNTER — Other Ambulatory Visit: Payer: Self-pay | Admitting: Internal Medicine

## 2013-04-24 ENCOUNTER — Other Ambulatory Visit: Payer: Self-pay | Admitting: Internal Medicine

## 2013-07-20 ENCOUNTER — Other Ambulatory Visit: Payer: Self-pay

## 2013-07-20 MED ORDER — LEVOTHYROXINE SODIUM 100 MCG PO TABS: 100.0000 ug | ORAL_TABLET | Freq: Every day | ORAL | Status: DC

## 2013-08-18 ENCOUNTER — Other Ambulatory Visit: Payer: Self-pay | Admitting: Internal Medicine

## 2013-08-29 ENCOUNTER — Encounter: Payer: Self-pay | Admitting: *Deleted

## 2013-09-18 ENCOUNTER — Other Ambulatory Visit: Payer: Self-pay | Admitting: Internal Medicine

## 2013-09-19 ENCOUNTER — Other Ambulatory Visit: Payer: Self-pay | Admitting: Internal Medicine

## 2013-10-20 ENCOUNTER — Other Ambulatory Visit: Payer: Self-pay | Admitting: Internal Medicine

## 2013-10-23 ENCOUNTER — Other Ambulatory Visit: Payer: Self-pay | Admitting: Internal Medicine

## 2013-11-09 ENCOUNTER — Ambulatory Visit (INDEPENDENT_AMBULATORY_CARE_PROVIDER_SITE_OTHER): Payer: Medicare Other | Admitting: Internal Medicine

## 2013-11-09 ENCOUNTER — Encounter: Payer: Self-pay | Admitting: Internal Medicine

## 2013-11-09 VITALS — BP 142/93 | HR 83 | Ht 66.0 in | Wt 255.1 lb

## 2013-11-09 DIAGNOSIS — I4891 Unspecified atrial fibrillation: Secondary | ICD-10-CM

## 2013-11-09 DIAGNOSIS — R000 Tachycardia, unspecified: Secondary | ICD-10-CM

## 2013-11-09 DIAGNOSIS — I4729 Other ventricular tachycardia: Secondary | ICD-10-CM

## 2013-11-09 DIAGNOSIS — I428 Other cardiomyopathies: Secondary | ICD-10-CM

## 2013-11-09 DIAGNOSIS — I472 Ventricular tachycardia: Secondary | ICD-10-CM

## 2013-11-09 DIAGNOSIS — Z95810 Presence of automatic (implantable) cardiac defibrillator: Secondary | ICD-10-CM

## 2013-11-09 MED ORDER — FUROSEMIDE 40 MG PO TABS: 40.0000 mg | ORAL_TABLET | Freq: Every day | ORAL | Status: DC

## 2013-11-09 MED ORDER — CEPHALEXIN 500 MG PO CAPS: 500.0000 mg | ORAL_CAPSULE | Freq: Three times a day (TID) | ORAL | Status: DC

## 2013-11-09 NOTE — Progress Notes (Signed)
Patient Care Team: Leonides Sake, MD as PCP - General (Family Medicine)   HPI  Cody Faulkner is a 68 y.o. male Seen in followup for an ICD implanted with an ICD in 2007 for wide-complex tachycardia in the setting of nonischemic cardiomyopathy. He had a 6949 lead replaced at generator replacement with 5935 lead--feb 2014  Stable  Echo EF 2014>> normal  He sees Dr. Wynonia Lawman 2-3 times a year. He has been sent certified letters on our behalf in the past.   He's had increasing swelling of late. Breathing remains short of stable. He denies chest pain. Past Medical History  Diagnosis Date  . Atrial fibrillation     Cardioversion 2014  . Hypertension   . OSA (obstructive sleep apnea)        . Depression   . CAD (coronary artery disease)     predominantly single vessel  . Ventricular tachycardia     s/p defibrillator-Medtronic EnTrust [id number]  . Dilated cardiomyopathy     s/p defibrillator Medtronic EnTrust [phone]  . Asthma   . Implantable Defibrillator     Medtronic Entrust  . Hypothyroidism   . Solitary kidney 06/05/2006  . Gout   . Pneumonia, community acquired     bilateral bibasilar   . Hypothyroidism   . History of hyperthyroidism   . Left knee DJD     needs surgery    Past Surgical History  Procedure Laterality Date  . Cardiac defibrillator placement  2007    Medtronic EnTrust [phone]  . Nephrectomy  1976    Right  . Cardiac catheterization  2007    60% Stenosis mid-CFX  . Knee arthroscopy      left  . Cardioversion  06/18/2011    Procedure: CARDIOVERSION;  Surgeon: Kerry Hough., MD;  Location: [facility] OR;  Service: Cardiovascular;  Laterality: N/A;  . Tee without cardioversion  07/13/2012    Procedure: TRANSESOPHAGEAL ECHOCARDIOGRAM (TEE);  Surgeon: Lelon Perla, MD;  Location: [facility] ENDOSCOPY;  Service: Cardiovascular;  Laterality: N/A;  . Cardioversion N/A 12/06/2012    Procedure: CARDIOVERSION;  Surgeon: Jacolyn Reedy, MD;  Location: [facility] ENDOSCOPY;   Service: Cardiovascular;  Laterality: N/A;    Current Outpatient Prescriptions  Medication Sig Dispense Refill  . acetaminophen (TYLENOL) 500 MG tablet Take 500 mg by mouth every 6 (six) hours as needed.       Marland Kitchen albuterol (PROVENTIL HFA;VENTOLIN HFA) 108 (90 BASE) MCG/ACT inhaler Inhale 2 puffs into the lungs every 4 (four) hours as needed. For shortness of breath       . allopurinol (ZYLOPRIM) 100 MG tablet daily.      Marland Kitchen amiodarone (PACERONE) 200 MG tablet Take 200 mg by mouth daily.       Marland Kitchen aspirin 81 MG chewable tablet Chew 81 mg by mouth daily.      . bisoprolol (ZEBETA) 10 MG tablet TAKE ONE TABLET BY MOUTH ONCE DAILY  30 tablet  5  . budesonide-formoterol (SYMBICORT) 160-4.5 MCG/ACT inhaler Inhale 2 puffs into the lungs 2 (two) times daily.      . Cholecalciferol (VITAMIN D) 2000 UNITS tablet Take 2,000 Units by mouth daily.      . fenofibrate 160 MG tablet Take 160 mg by mouth daily.      . furosemide (LASIX) 40 MG tablet Take 40 mg by mouth 2 (two) times daily.       . isosorbide mononitrate (IMDUR) 60 MG 24 hr tablet Take 60  mg by mouth daily.      Marland Kitchen levothyroxine (SYNTHROID, LEVOTHROID) 100 MCG tablet Take 1 tablet (100 mcg total) by mouth daily.  30 tablet  4  . loratadine (CLARITIN) 10 MG tablet Take 10 mg by mouth every morning.       Marland Kitchen OVER THE COUNTER MEDICATION 100 mg daily. Stool softner      . potassium chloride SA (K-DUR,KLOR-CON) 20 MEQ tablet Take 20 mEq by mouth every other day.      . warfarin (COUMADIN) 5 MG tablet Take 5-7.5 mg by mouth daily. Monday, Wednesday, and Friday, take 7.5 mg (one and a half tablets) Tuesday, Thursday, Saturday, Sunday, take 5 mg (one tablet)       No current facility-administered medications for this visit.    Allergies  Allergen Reactions  . Methimazole [Thiamazole] Itching and Rash    Severe rash    Review of Systems negative except from HPI and PMH  Physical Exam BP 142/93  Pulse 83  Ht 5\' 6"  (1.676 m)  Wt 255 lb 1.9 oz  (115.722 kg)  BMI 41.20 kg/m2 Well developed and well nourished in no acute distress HENT normal E scleral and icterus clear Neck Supple Clear to ausculation  Regular rate and rhythm, no murmurs gallops or rub Soft with active bowel sounds No clubbing cyanosis 1-2+ Edema erythema of the left lower leg with warmth Alert and oriented, grossly normal motor and sensory function Skin Warm and Dry    Assessment and  Plan Congestive heart failure-chronic-mixed  Nonischemic cardio myopathy with interval improvement  Cellulitis  Implantable defibrillator-Medtronic The patient's device was interrogated.  The information was reviewed. No changes were made in the programming.  with  Atrial fibrillation-paroxysmal on amiodarone  He is not having any atrial fibrillation. Continue on warfarin and amiodarone. I will defer to Dr. Wynonia Lawman as to whether he needs aspirin or not. I anticipate that Dr. Wynonia Lawman is followed amiodarone surveillance laboratories  There is cellulitis in his left leg. We will put him on a week's worth of Keflex. We'll followup with Dr. Lisbeth Ply next 2 weeks to be sure this is resolved.  Intracardiac monitoring demonstrates a significant elevation in his Optivol index.  Clarifying his diuretic dose, was then prescribed twice daily, he takes it once a day. Will have him take it twice daily 3 days a week.  He is also having problems with nocturia. I suspect is related to prostatism. I suggested that he stay with his primary care doctor about this and possible urological referral

## 2013-11-09 NOTE — Patient Instructions (Addendum)
Your physician has recommended you make the following change in your medication: Monday, Wednesday and Friday you will take your Lasix twice a day. Tuesday, Thursday, Saturday and Sunday take lasix once a day.  Take Keflex 500 mg every 8 hours for the next 10 days per Dr. Olin Pia orders.   Your physician wants you to follow-up in: 12 months with Dr. Caryl Comes. You will receive a reminder letter in the mail two months in advance. If you don't receive a letter, please call our office to schedule the follow-up appointment.

## 2013-11-23 ENCOUNTER — Other Ambulatory Visit: Payer: Self-pay | Admitting: Internal Medicine

## 2013-12-04 ENCOUNTER — Encounter: Payer: Medicare Other | Admitting: *Deleted

## 2013-12-04 ENCOUNTER — Ambulatory Visit (INDEPENDENT_AMBULATORY_CARE_PROVIDER_SITE_OTHER): Payer: Medicare Other | Admitting: Internal Medicine

## 2013-12-04 DIAGNOSIS — Z5189 Encounter for other specified aftercare: Secondary | ICD-10-CM

## 2013-12-04 DIAGNOSIS — L02219 Cutaneous abscess of trunk, unspecified: Secondary | ICD-10-CM

## 2013-12-04 DIAGNOSIS — T827XXD Infection and inflammatory reaction due to other cardiac and vascular devices, implants and grafts, subsequent encounter: Secondary | ICD-10-CM

## 2013-12-04 DIAGNOSIS — L03319 Cellulitis of trunk, unspecified: Secondary | ICD-10-CM

## 2013-12-04 DIAGNOSIS — L0390 Cellulitis, unspecified: Secondary | ICD-10-CM | POA: Insufficient documentation

## 2013-12-04 LAB — MDC_IDC_ENUM_SESS_TYPE_INCLINIC

## 2013-12-04 MED ORDER — CEPHALEXIN 500 MG PO CAPS: 500.0000 mg | ORAL_CAPSULE | Freq: Three times a day (TID) | ORAL | Status: DC

## 2013-12-04 NOTE — Progress Notes (Signed)
Patient Care Team: Leonides Sake, MD as PCP - General (Family Medicine)   HPI  Cody Faulkner is a 68 y.o. male Comes in today with complaints of swelling and tenderness over his defibrillator site. He has noted no fever.  He underwent generator replacement with revision of his 6949-lead  February/2014  He has a history of ventricular tachycardia in the setting of nonischemic cardiomyopathy.  He has chronic shortness of breath.  Past Medical History  Diagnosis Date  . Atrial fibrillation     Cardioversion 2014  . Hypertension   . OSA (obstructive sleep apnea)        . Depression   . CAD (coronary artery disease)     predominantly single vessel  . Ventricular tachycardia     s/p defibrillator-Medtronic EnTrust [id number]  . Dilated cardiomyopathy     s/p defibrillator Medtronic EnTrust [phone]  . Asthma   . Implantable Defibrillator     Medtronic Entrust  . Hypothyroidism   . Solitary kidney 06/05/2006  . Gout   . Pneumonia, community acquired     bilateral bibasilar   . Hypothyroidism   . History of hyperthyroidism   . Left knee DJD     needs surgery    Past Surgical History  Procedure Laterality Date  . Cardiac defibrillator placement  2007    Medtronic EnTrust [phone]  . Nephrectomy  1976    Right  . Cardiac catheterization  2007    60% Stenosis mid-CFX  . Knee arthroscopy      left  . Cardioversion  06/18/2011    Procedure: CARDIOVERSION;  Surgeon: Kerry Hough., MD;  Location: [facility] OR;  Service: Cardiovascular;  Laterality: N/A;  . Tee without cardioversion  07/13/2012    Procedure: TRANSESOPHAGEAL ECHOCARDIOGRAM (TEE);  Surgeon: Lelon Perla, MD;  Location[facility] ENDOSCOPY;  Service: Cardiovascular;  Laterality: N/A;  . Cardioversion N/A 12/06/2012    Procedure: CARDIOVERSION;  Surgeon: Jacolyn Reedy, MD;  Location: [facility] ENDOSCOPY;  Service: Cardiovascular;  Laterality: N/A;    Current Outpatient Prescriptions  Medication Sig Dispense Refill  .  acetaminophen (TYLENOL) 500 MG tablet Take 500 mg by mouth every 6 (six) hours as needed.       Marland Kitchen albuterol (PROVENTIL HFA;VENTOLIN HFA) 108 (90 BASE) MCG/ACT inhaler Inhale 2 puffs into the lungs every 4 (four) hours as needed. For shortness of breath       . allopurinol (ZYLOPRIM) 100 MG tablet daily.      Marland Kitchen amiodarone (PACERONE) 200 MG tablet Take 200 mg by mouth daily.       Marland Kitchen aspirin 81 MG chewable tablet Chew 81 mg by mouth daily.      . bisoprolol (ZEBETA) 10 MG tablet TAKE ONE TABLET BY MOUTH ONCE DAILY  30 tablet  5  . budesonide-formoterol (SYMBICORT) 160-4.5 MCG/ACT inhaler Inhale 2 puffs into the lungs 2 (two) times daily.      . cephALEXin (KEFLEX) 500 MG capsule Take 1 capsule (500 mg total) by mouth 3 (three) times daily.  30 capsule  0  . Cholecalciferol (VITAMIN D) 2000 UNITS tablet Take 2,000 Units by mouth daily.      . fenofibrate 160 MG tablet Take 160 mg by mouth daily.      . furosemide (LASIX) 40 MG tablet Take 1 tablet (40 mg total) by mouth daily. Take 2 tablets (80 mg total) by mouth Monday, Wednesday, and Friday.  40 tablet  6  . isosorbide  mononitrate (IMDUR) 60 MG 24 hr tablet Take 60 mg by mouth daily.      . isosorbide mononitrate (IMDUR) 60 MG 24 hr tablet TAKE ONE TABLET BY MOUTH ONCE DAILY  30 tablet  5  . levothyroxine (SYNTHROID, LEVOTHROID) 100 MCG tablet Take 1 tablet (100 mcg total) by mouth daily.  30 tablet  4  . loratadine (CLARITIN) 10 MG tablet Take 10 mg by mouth every morning.       Marland Kitchen OVER THE COUNTER MEDICATION 100 mg daily. Stool softner      . potassium chloride SA (K-DUR,KLOR-CON) 20 MEQ tablet Take 20 mEq by mouth every other day.      . warfarin (COUMADIN) 5 MG tablet Take 5-7.5 mg by mouth daily. Monday, Wednesday, and Friday, take 7.5 mg (one and a half tablets) Tuesday, Thursday, Saturday, Sunday, take 5 mg (one tablet)       No current facility-administered medications for this visit.    Allergies  Allergen Reactions  . Methimazole  [Thiamazole] Itching and Rash    Severe rash    Review of Systems negative except from HPI and PMH  Physical Exam  Lungs clear RRR No edema Erythema and tenderness over his defibrillator site       Assessment and  Plan  Cellulitis? Defibrillator pocket infection  Nonischemic cardiomyopathy  Ventricular tachycardia  Atrial fibrillation  Implantable defibrillator-Medtronic//6949-lead revision generator replacement 2014   The patient presents with erythema and tenderness over his defibrillator pocket. I fear that the device pocket is the source of the infection.  We will check blood cultures today a CBC and begin him on Keflex. We will review it again next week Dr Elliot Cousin and I to consider options He is advised that if this gets worse he needs to come to the hospital

## 2013-12-04 NOTE — Patient Instructions (Addendum)
Your physician has recommended you make the following change in your medication: 1) START Keflex 500 mg every 8 hours for 10 days  Please come back 12/12/13 at 1:45 pm for follow up appointment  Labs today: CBC, Blood cultures

## 2013-12-05 LAB — CBC WITH DIFFERENTIAL/PLATELET
BASOS ABS: 0 10*3/uL (ref 0.0–0.2)
BASOS: 0 %
EOS ABS: 0.1 10*3/uL (ref 0.0–0.4)
Eos: 1 %
HCT: 39.3 % (ref 37.5–51.0)
Hemoglobin: 13.5 g/dL (ref 12.6–17.7)
Lymphocytes Absolute: 1.7 10*3/uL (ref 0.7–3.1)
Lymphs: 21 %
MCH: 28.4 pg (ref 26.6–33.0)
MCHC: 34.4 g/dL (ref 31.5–35.7)
MCV: 83 fL (ref 79–97)
MONOS ABS: 1 10*3/uL — AB (ref 0.1–0.9)
Monocytes: 13 %
NEUTROS PCT: 65 %
Neutrophils Absolute: 5 10*3/uL (ref 1.4–7.0)
RBC: 4.75 x10E6/uL (ref 4.14–5.80)
RDW: 15.2 % (ref 12.3–15.4)
WBC: 7.8 10*3/uL (ref 3.4–10.8)

## 2013-12-06 ENCOUNTER — Encounter: Payer: Self-pay | Admitting: *Deleted

## 2013-12-06 ENCOUNTER — Telehealth: Payer: Self-pay | Admitting: Internal Medicine

## 2013-12-06 NOTE — Telephone Encounter (Signed)
I called pt earlier this morning and lmtcb to see if pt had taken his temperature yesterday/today. His cousin calls back to tell me that pt has not taken it and has no thermometer to take it with (she was asking him while on phone with me). So she is going to get hers and let him use it. I will call her in the morning to check on temperature status. She and pt are agreeable to plan.

## 2013-12-06 NOTE — Telephone Encounter (Signed)
New message          Pt cousin would like for you to give her a call concerning pt / she would not give any detail info

## 2013-12-07 NOTE — Telephone Encounter (Addendum)
Called to check on pt temperature. Pt has 98.5 temp. Cody Faulkner tells me that he thought site was swollen this morning but it has since gone down. Pt is currently feeling ok. Instructed to monitor temperature and site. Plan to see pt next week 7/7 in office. They verbalized understanding.

## 2013-12-10 LAB — CULTURE, BLOOD (SINGLE)

## 2013-12-12 ENCOUNTER — Encounter: Payer: Self-pay | Admitting: Internal Medicine

## 2013-12-12 ENCOUNTER — Ambulatory Visit (INDEPENDENT_AMBULATORY_CARE_PROVIDER_SITE_OTHER): Payer: Medicare Other | Admitting: Internal Medicine

## 2013-12-12 VITALS — BP 123/69 | HR 70 | Temp 97.2°F | Ht 66.0 in | Wt 257.0 lb

## 2013-12-12 DIAGNOSIS — I428 Other cardiomyopathies: Secondary | ICD-10-CM

## 2013-12-12 DIAGNOSIS — I4729 Other ventricular tachycardia: Secondary | ICD-10-CM

## 2013-12-12 DIAGNOSIS — I4891 Unspecified atrial fibrillation: Secondary | ICD-10-CM

## 2013-12-12 DIAGNOSIS — I472 Ventricular tachycardia, unspecified: Secondary | ICD-10-CM

## 2013-12-12 DIAGNOSIS — I480 Paroxysmal atrial fibrillation: Secondary | ICD-10-CM

## 2013-12-12 DIAGNOSIS — Z95810 Presence of automatic (implantable) cardiac defibrillator: Secondary | ICD-10-CM

## 2013-12-12 LAB — PROTIME-INR
INR: 2.3 ratio — AB (ref 0.8–1.0)
PROTHROMBIN TIME: 25 s — AB (ref 9.6–13.1)

## 2013-12-12 NOTE — Patient Instructions (Signed)
Your physician recommends that you continue on your current medications as directed. Please refer to the Current Medication list given to you today.  Lab today: INR  Your physician recommends that you have a lead extraction. We will contact you next week to arrange this.

## 2013-12-12 NOTE — Progress Notes (Signed)
Patient Care Team: Leonides Sake, MD as PCP - General (Family Medicine)   HPI  Cody Faulkner is a 68 y.o. male  Who is referred today by Dr. Caryl Comes for ICD pocket infection. His history dates back to 2007 when he had an ICD implanted with an ICD in 2007 for wide-complex tachycardia in the setting of nonischemic cardiomyopathy. He had a 6949 lead replaced at generator replacement with 5935 lead--feb 2014.  He developed cellulitis of the lower extremities presumably due to chronic venous stasis. He was seen by Dr. Caryl Comes  in early June and put him on antibiotics. At that time his device pocket was normal by inspection. He came in 3 weeks later with erythema over the pocket. He has been treated with a week's worth of antibiotics. He returns today for reinspection. He notes that the pain over his site is improved but still red and tender to palpitation. He denies fever and chills. No recent ICD shocks.       Past Medical History  Diagnosis Date  . Atrial fibrillation     Cardioversion 2014  . Hypertension   . OSA (obstructive sleep apnea)        . Depression   . CAD (coronary artery disease)     predominantly single vessel  . Ventricular tachycardia     s/p defibrillator-Medtronic EnTrust [id number]  . Dilated cardiomyopathy     s/p defibrillator Medtronic EnTrust [phone]  . Asthma   . Implantable Defibrillator     Medtronic Entrust  . Hypothyroidism   . Solitary kidney 06/05/2006  . Gout   . Pneumonia, community acquired     bilateral bibasilar   . Hypothyroidism   . History of hyperthyroidism   . Left knee DJD     needs surgery    Past Surgical History  Procedure Laterality Date  . Cardiac defibrillator placement  2007    Medtronic EnTrust [phone]  . Nephrectomy  1976    Right  . Cardiac catheterization  2007    60% Stenosis mid-CFX  . Knee arthroscopy      left  . Cardioversion  06/18/2011    Procedure: CARDIOVERSION;  Surgeon: Kerry Hough., MD;  Location: [facility]  OR;  Service: Cardiovascular;  Laterality: N/A;  . Tee without cardioversion  07/13/2012    Procedure: TRANSESOPHAGEAL ECHOCARDIOGRAM (TEE);  Surgeon: Lelon Perla, MD;  Location: [facility] ENDOSCOPY;  Service: Cardiovascular;  Laterality: N/A;  . Cardioversion N/A 12/06/2012    Procedure: CARDIOVERSION;  Surgeon: Jacolyn Reedy, MD;  Location: [facility] ENDOSCOPY;  Service: Cardiovascular;  Laterality: N/A;    Current Outpatient Prescriptions  Medication Sig Dispense Refill  . acetaminophen (TYLENOL) 500 MG tablet Take 500 mg by mouth every 6 (six) hours as needed.       Marland Kitchen albuterol (PROVENTIL HFA;VENTOLIN HFA) 108 (90 BASE) MCG/ACT inhaler Inhale 2 puffs into the lungs every 4 (four) hours as needed. For shortness of breath       . allopurinol (ZYLOPRIM) 100 MG tablet daily.      Marland Kitchen amiodarone (PACERONE) 200 MG tablet Take 200 mg by mouth daily.       Marland Kitchen aspirin 81 MG chewable tablet Chew 81 mg by mouth daily.      . bisoprolol (ZEBETA) 10 MG tablet TAKE ONE TABLET BY MOUTH ONCE DAILY  30 tablet  5  . budesonide-formoterol (SYMBICORT) 160-4.5 MCG/ACT inhaler Inhale 2 puffs into the lungs 2 (two) times daily.      Marland Kitchen  cephALEXin (KEFLEX) 500 MG capsule Take 1 capsule (500 mg total) by mouth 3 (three) times daily.  30 capsule  0  . Cholecalciferol (VITAMIN D) 2000 UNITS tablet Take 2,000 Units by mouth daily.      . fenofibrate 160 MG tablet Take 160 mg by mouth daily.      . furosemide (LASIX) 40 MG tablet Take 1 tablet (40 mg total) by mouth daily. Take 2 tablets (80 mg total) by mouth Monday, Wednesday, and Friday.  40 tablet  6  . isosorbide mononitrate (IMDUR) 60 MG 24 hr tablet Take 60 mg by mouth daily.      Marland Kitchen levothyroxine (SYNTHROID, LEVOTHROID) 100 MCG tablet Take 1 tablet (100 mcg total) by mouth daily.  30 tablet  4  . loratadine (CLARITIN) 10 MG tablet Take 10 mg by mouth every morning.       Marland Kitchen OVER THE COUNTER MEDICATION 100 mg daily. Stool softner      . potassium chloride SA (K-DUR,KLOR-CON)  20 MEQ tablet Take 20 mEq by mouth every other day.      . warfarin (COUMADIN) 5 MG tablet Take 5-7.5 mg by mouth daily. Monday, Wednesday, and Friday, take 7.5 mg (one and a half tablets) Tuesday, Thursday, Saturday, Sunday, take 5 mg (one tablet)       No current facility-administered medications for this visit.    Allergies  Allergen Reactions  . Methimazole [Thiamazole] Itching and Rash    Severe rash    Review of Systems negative except from HPI and PMH  Physical Exam BP 123/69  Pulse 70  Temp(Src) 97.2 F (36.2 C)  Ht 5\' 6"  (1.676 m)  Wt 257 lb (116.574 kg)  BMI 41.50 kg/m2 Progressive erythema and induration of the pocket Chronically ill but overall stable appearing obese 68 yo man, NAD HEENT: Unremarkable,[place], AT Neck:  6 JVD, no thyromegally Back:  No CVA tenderness Lungs:  Clear with no wheezes, rales, or rhonchi HEART:  Regular rate rhythm, no murmurs, no rubs, no clicks Abd:  soft, positive bowel sounds, no organomegally, no rebound, no guarding Ext:  2 plus pulses, no edema, no cyanosis, no clubbing Skin:  No rashes no nodules Neuro:  CN II through XII intact, motor grossly intact  CXR - reviewed  Assessment and  Plan 1. ICD pocket infection 2. H/o Wide QRS tachy 3. Non-ischemic CM 4. PAF 5. Chronic coumadin therapy Rec: I have discussed the treatment options with the patient. His pocket infection will not be cured with anti-biotics. I have recommended extraction in the next week or so. We would like the patient to hold his coumadin for 3 days prior to the procedure. The risks/benefits/goals/expectations of catheter ablation have been discussed and he wishes to proceed.  Mikle Bosworth.D.

## 2013-12-13 ENCOUNTER — Other Ambulatory Visit: Payer: Self-pay | Admitting: *Deleted

## 2013-12-13 ENCOUNTER — Telehealth: Payer: Self-pay | Admitting: *Deleted

## 2013-12-13 DIAGNOSIS — Z01812 Encounter for preprocedural laboratory examination: Secondary | ICD-10-CM

## 2013-12-13 NOTE — Telephone Encounter (Signed)
Pt's family called to discuss procedure. Pt in background listening and talking also. Informed them lead extraction scheduled for 7/15. Pre procedure labs 7/9.

## 2013-12-14 ENCOUNTER — Other Ambulatory Visit (INDEPENDENT_AMBULATORY_CARE_PROVIDER_SITE_OTHER): Payer: Medicare Other

## 2013-12-14 ENCOUNTER — Ambulatory Visit: Payer: Medicare Other

## 2013-12-14 DIAGNOSIS — Z01812 Encounter for preprocedural laboratory examination: Secondary | ICD-10-CM

## 2013-12-14 LAB — CBC WITH DIFFERENTIAL/PLATELET
BASOS ABS: 0 10*3/uL (ref 0.0–0.1)
Basophils Relative: 0.3 % (ref 0.0–3.0)
Eosinophils Absolute: 0.1 10*3/uL (ref 0.0–0.7)
Eosinophils Relative: 1.2 % (ref 0.0–5.0)
HCT: 40.2 % (ref 39.0–52.0)
HEMOGLOBIN: 13.3 g/dL (ref 13.0–17.0)
LYMPHS PCT: 23.6 % (ref 12.0–46.0)
Lymphs Abs: 1.9 10*3/uL (ref 0.7–4.0)
MCHC: 33 g/dL (ref 30.0–36.0)
MCV: 84.5 fl (ref 78.0–100.0)
MONOS PCT: 7.9 % (ref 3.0–12.0)
Monocytes Absolute: 0.6 10*3/uL (ref 0.1–1.0)
Neutro Abs: 5.5 10*3/uL (ref 1.4–7.7)
Neutrophils Relative %: 67 % (ref 43.0–77.0)
PLATELETS: 287 10*3/uL (ref 150.0–400.0)
RBC: 4.76 Mil/uL (ref 4.22–5.81)
RDW: 15.3 % (ref 11.5–15.5)
WBC: 8.1 10*3/uL (ref 4.0–10.5)

## 2013-12-14 LAB — BASIC METABOLIC PANEL
BUN: 30 mg/dL — ABNORMAL HIGH (ref 6–23)
CALCIUM: 9.6 mg/dL (ref 8.4–10.5)
CHLORIDE: 106 meq/L (ref 96–112)
CO2: 26 mEq/L (ref 19–32)
CREATININE: 2 mg/dL — AB (ref 0.4–1.5)
GFR: 35.51 mL/min — ABNORMAL LOW (ref >60.00)
Glucose, Bld: 96 mg/dL (ref 70–99)
Potassium: 4 mEq/L (ref 3.5–5.1)
SODIUM: 140 meq/L (ref 135–145)

## 2013-12-14 LAB — PROTIME-INR
INR: 1.9 ratio — AB (ref 0.8–1.0)
PROTHROMBIN TIME: 20.6 s — AB (ref 9.6–13.1)

## 2013-12-18 ENCOUNTER — Telehealth: Payer: Self-pay | Admitting: Internal Medicine

## 2013-12-18 ENCOUNTER — Other Ambulatory Visit: Payer: Self-pay | Admitting: Internal Medicine

## 2013-12-18 ENCOUNTER — Encounter (HOSPITAL_COMMUNITY): Payer: Self-pay | Admitting: Pharmacy Technician

## 2013-12-18 DIAGNOSIS — I428 Other cardiomyopathies: Secondary | ICD-10-CM

## 2013-12-18 NOTE — Telephone Encounter (Signed)
Follow up     Pt is having a procedure Wednesday and they need a list of the patients medications.  Please call the hosp pharmacy

## 2013-12-18 NOTE — Telephone Encounter (Signed)
Follow up     Calling to get a list of patients medications

## 2013-12-18 NOTE — Telephone Encounter (Signed)
Discussed patient's current medications.

## 2013-12-18 NOTE — Telephone Encounter (Signed)
New message     Pt is having a procedure Wednesday.  They need a list of his medications called in to the hosp.

## 2013-12-19 ENCOUNTER — Encounter (HOSPITAL_COMMUNITY): Payer: Self-pay

## 2013-12-19 ENCOUNTER — Ambulatory Visit (HOSPITAL_COMMUNITY)
Admission: RE | Admit: 2013-12-19 | Discharge: 2013-12-19 | Disposition: A | Discharge status: Home / Self Care | Payer: Medicare Other | Source: Ambulatory Visit | Attending: Anesthesiology | Admitting: Anesthesiology

## 2013-12-19 ENCOUNTER — Encounter (HOSPITAL_COMMUNITY)
Admission: RE | Admit: 2013-12-19 | Discharge: 2013-12-19 | Disposition: A | Discharge status: Home / Self Care | Payer: Medicare Other | Source: Ambulatory Visit | Attending: Internal Medicine | Admitting: Internal Medicine

## 2013-12-19 DIAGNOSIS — Z01818 Encounter for other preprocedural examination: Secondary | ICD-10-CM | POA: Insufficient documentation

## 2013-12-19 DIAGNOSIS — Z01812 Encounter for preprocedural laboratory examination: Secondary | ICD-10-CM

## 2013-12-19 DIAGNOSIS — Z01810 Encounter for preprocedural cardiovascular examination: Secondary | ICD-10-CM

## 2013-12-19 HISTORY — DX: Dizziness and giddiness: R42

## 2013-12-19 HISTORY — DX: Frequency of micturition: R35.0

## 2013-12-19 HISTORY — DX: Constipation, unspecified: K59.00

## 2013-12-19 HISTORY — DX: Shortness of breath: R06.02

## 2013-12-19 HISTORY — DX: Gastro-esophageal reflux disease without esophagitis: K21.9

## 2013-12-19 HISTORY — DX: Unspecified cataract: H26.9

## 2013-12-19 HISTORY — DX: Acute myocardial infarction, unspecified: I21.9

## 2013-12-19 HISTORY — DX: Other seasonal allergic rhinitis: J30.2

## 2013-12-19 HISTORY — DX: Localized edema: R60.0

## 2013-12-19 HISTORY — DX: Hyperlipidemia, unspecified: E78.5

## 2013-12-19 HISTORY — DX: Effusion, unspecified joint: M25.40

## 2013-12-19 HISTORY — DX: Edema, unspecified: R60.9

## 2013-12-19 HISTORY — DX: Other skin changes: R23.8

## 2013-12-19 HISTORY — DX: Urgency of urination: R39.15

## 2013-12-19 HISTORY — DX: Unspecified urinary incontinence: R32

## 2013-12-19 HISTORY — DX: Spontaneous ecchymoses: R23.3

## 2013-12-19 HISTORY — DX: Pain in unspecified joint: M25.50

## 2013-12-19 LAB — APTT: aPTT: 38 seconds — ABNORMAL HIGH (ref 24–37)

## 2013-12-19 LAB — TYPE AND SCREEN
ABO/RH(D): O POS
ANTIBODY SCREEN: NEGATIVE

## 2013-12-19 LAB — URINALYSIS, ROUTINE W REFLEX MICROSCOPIC
Bilirubin Urine: NEGATIVE
GLUCOSE, UA: NEGATIVE mg/dL
HGB URINE DIPSTICK: NEGATIVE
Ketones, ur: NEGATIVE mg/dL
LEUKOCYTES UA: NEGATIVE
Nitrite: NEGATIVE
Protein, ur: NEGATIVE mg/dL
SPECIFIC GRAVITY, URINE: 1.017 (ref 1.005–1.030)
Urobilinogen, UA: 1 mg/dL (ref 0.0–1.0)
pH: 5.5 (ref 5.0–8.0)

## 2013-12-19 LAB — CBC
HEMATOCRIT: 40.6 % (ref 39.0–52.0)
HEMOGLOBIN: 13.5 g/dL (ref 13.0–17.0)
MCH: 28.5 pg (ref 26.0–34.0)
MCHC: 33.3 g/dL (ref 30.0–36.0)
MCV: 85.7 fL (ref 78.0–100.0)
Platelets: 239 10*3/uL (ref 150–400)
RBC: 4.74 MIL/uL (ref 4.22–5.81)
RDW: 14.7 % (ref 11.5–15.5)
WBC: 7.2 10*3/uL (ref 4.0–10.5)

## 2013-12-19 LAB — BASIC METABOLIC PANEL
Anion gap: 13 (ref 5–15)
BUN: 28 mg/dL — ABNORMAL HIGH (ref 6–23)
CHLORIDE: 104 meq/L (ref 96–112)
CO2: 26 meq/L (ref 19–32)
CREATININE: 1.94 mg/dL — AB (ref 0.50–1.35)
Calcium: 9.4 mg/dL (ref 8.4–10.5)
GFR calc Af Amer: 39 mL/min — ABNORMAL LOW (ref >90)
GFR calc non Af Amer: 34 mL/min — ABNORMAL LOW (ref >90)
GLUCOSE: 102 mg/dL — AB (ref 70–99)
Potassium: 3.9 mEq/L (ref 3.7–5.3)
Sodium: 143 mEq/L (ref 137–147)

## 2013-12-19 LAB — SURGICAL PCR SCREEN
MRSA, PCR: NEGATIVE
Staphylococcus aureus: NEGATIVE

## 2013-12-19 LAB — PROTIME-INR
INR: 1.6 — ABNORMAL HIGH (ref 0.00–1.49)
Prothrombin Time: 19.1 seconds — ABNORMAL HIGH (ref 11.6–15.2)

## 2013-12-19 MED ORDER — SODIUM CHLORIDE 0.9 % IR SOLN
80.0000 mg | Status: DC
  Filled 2013-12-19: qty 2

## 2013-12-19 MED ORDER — CHLORHEXIDINE GLUCONATE 4 % EX LIQD
60.0000 mL | Freq: Once | CUTANEOUS | Status: DC
  Filled 2013-12-19: qty 60

## 2013-12-19 MED ORDER — SODIUM CHLORIDE 0.9 % IV SOLN: INTRAVENOUS | Status: DC

## 2013-12-19 MED ORDER — VANCOMYCIN HCL 10 G IV SOLR
1500.0000 mg | INTRAVENOUS | Status: AC
  Administered 2013-12-20: 1500 mg via INTRAVENOUS
  Filled 2013-12-19: qty 1500

## 2013-12-19 NOTE — Progress Notes (Signed)
i personally called Cody Faulkner to make sure she was aware of pt scheduled procedure tomorrow;she didn't know but states she will be here

## 2013-12-19 NOTE — Progress Notes (Addendum)
Cardiologist is Dr.Spencer Wynonia Lawman visit in epic from 12/2012  Saw Dr.Klein end of June 2015-report in epic  Denies ever having a stress test  Echo reports in epic from 2007/2012/2014  Heart cath in epic from 2007  Sleep study in epic from 2012/2013  Dr,[name] is Medical Md

## 2013-12-19 NOTE — Pre-Procedure Instructions (Signed)
Cody Faulkner  12/19/2013   Your procedure is scheduled on:  Wed, July 15 @ 8:30 AM  Report to [facility]  at 6:30 AM.  Call this number if you have problems the morning of surgery: [phone]   Remember:   Do not eat food or drink liquids after midnight.   Take these medicines the morning of surgery with A SIP OF WATER: Amiodarone(Pacerone),Albuterol<Bring Your Inhaler With You>,Allopurinol(Zyloprim),Bisoprolol(Zebeta),Symbicort,Imdur(Isosorbide),Synthroid(Levothyroxine),and Claritin(Loratadine)               No Goody's,BC's,Aleve,Ibuprofen,Fish Oil,or any Herbal Medications   Do not wear jewelry  Do not wear lotions, powders, or colognes.   Men may shave face and neck.  Do not bring valuables to the hospital.  [facility] is not responsible                  for any belongings or valuables.               Contacts, dentures or bridgework may not be worn into surgery.  Leave suitcase in the car. After surgery it may be brought to your room.  For patients admitted to the hospital, discharge time is determined by your                treatment team.                  Special Instructions:  [facility] - Preparing for Surgery  Before surgery, you can play an important role.  Because skin is not sterile, your skin needs to be as free of germs as possible.  You can reduce the number of germs on you skin by washing with CHG (chlorahexidine gluconate) soap before surgery.  CHG is an antiseptic cleaner which kills germs and bonds with the skin to continue killing germs even after washing.  Please DO NOT use if you have an allergy to CHG or antibacterial soaps.  If your skin becomes reddened/irritated stop using the CHG and inform your nurse when you arrive at Short Stay.  Do not shave (including legs and underarms) for at least 48 hours prior to the first CHG shower.  You may shave your face.  Please follow these instructions carefully:   1.  Shower with CHG Soap the night  before surgery and the                                morning of Surgery.  2.  If you choose to wash your hair, wash your hair first as usual with your       normal shampoo.  3.  After you shampoo, rinse your hair and body thoroughly to remove the                      Shampoo.  4.  Use CHG as you would any other liquid soap.  You can apply chg directly       to the skin and wash gently with scrungie or a clean washcloth.  5.  Apply the CHG Soap to your body ONLY FROM THE NECK DOWN.        Do not use on open wounds or open sores.  Avoid contact with your eyes,       ears, mouth and genitals (private parts).  Wash genitals (private parts)       with your normal soap.  6.  Wash thoroughly,  paying special attention to the area where your surgery        will be performed.  7.  Thoroughly rinse your body with warm water from the neck down.  8.  DO NOT shower/wash with your normal soap after using and rinsing off       the CHG Soap.  9.  Pat yourself dry with a clean towel.            10.  Wear clean pajamas.            11.  Place clean sheets on your bed the night of your first shower and do not        sleep with pets.  Day of Surgery  Do not apply any lotions/deoderants the morning of surgery.  Please wear clean clothes to the hospital/surgery center.     Please read over the following fact sheets that you were given: Pain Booklet, Coughing and Deep Breathing, Blood Transfusion Information, MRSA Information and Surgical Site Infection Prevention

## 2013-12-19 NOTE — Progress Notes (Signed)
Per Dr.Taylor staff-Medtronic has been notified and will be here for surgery

## 2013-12-19 NOTE — Telephone Encounter (Signed)
Scheduled wound check for 7/27. Patient verbalized understanding in the background.

## 2013-12-20 ENCOUNTER — Ambulatory Visit (HOSPITAL_COMMUNITY): Payer: Medicare Other | Admitting: Certified Registered"

## 2013-12-20 ENCOUNTER — Encounter (HOSPITAL_COMMUNITY): Payer: Self-pay | Admitting: Surgery

## 2013-12-20 ENCOUNTER — Encounter (HOSPITAL_COMMUNITY): Payer: Medicare Other | Admitting: Certified Registered"

## 2013-12-20 ENCOUNTER — Encounter (HOSPITAL_COMMUNITY)
Admission: RE | Disposition: A | Discharge status: Skilled Nursing Facility | Payer: Self-pay | Source: Ambulatory Visit | Attending: Internal Medicine

## 2013-12-20 ENCOUNTER — Inpatient Hospital Stay (HOSPITAL_COMMUNITY)
Admission: RE | Admit: 2013-12-20 | Discharge: 2013-12-26 | DRG: 261 | Disposition: A | Discharge status: Skilled Nursing Facility | Payer: Medicare Other | Source: Ambulatory Visit | Attending: Internal Medicine | Admitting: Internal Medicine

## 2013-12-20 DIAGNOSIS — I472 Ventricular tachycardia, unspecified: Secondary | ICD-10-CM | POA: Diagnosis not present

## 2013-12-20 DIAGNOSIS — I495 Sick sinus syndrome: Secondary | ICD-10-CM | POA: Diagnosis present

## 2013-12-20 DIAGNOSIS — Y831 Surgical operation with implant of artificial internal device as the cause of abnormal reaction of the patient, or of later complication, without mention of misadventure at the time of the procedure: Secondary | ICD-10-CM | POA: Diagnosis present

## 2013-12-20 DIAGNOSIS — I2510 Atherosclerotic heart disease of native coronary artery without angina pectoris: Secondary | ICD-10-CM | POA: Diagnosis present

## 2013-12-20 DIAGNOSIS — F329 Major depressive disorder, single episode, unspecified: Secondary | ICD-10-CM | POA: Diagnosis present

## 2013-12-20 DIAGNOSIS — I4891 Unspecified atrial fibrillation: Secondary | ICD-10-CM | POA: Diagnosis present

## 2013-12-20 DIAGNOSIS — G4733 Obstructive sleep apnea (adult) (pediatric): Secondary | ICD-10-CM | POA: Diagnosis present

## 2013-12-20 DIAGNOSIS — T827XXS Infection and inflammatory reaction due to other cardiac and vascular devices, implants and grafts, sequela: Secondary | ICD-10-CM

## 2013-12-20 DIAGNOSIS — R609 Edema, unspecified: Secondary | ICD-10-CM | POA: Diagnosis present

## 2013-12-20 DIAGNOSIS — T82110A Breakdown (mechanical) of cardiac electrode, initial encounter: Secondary | ICD-10-CM

## 2013-12-20 DIAGNOSIS — T827XXA Infection and inflammatory reaction due to other cardiac and vascular devices, implants and grafts, initial encounter: Principal | ICD-10-CM | POA: Diagnosis present

## 2013-12-20 DIAGNOSIS — Z95810 Presence of automatic (implantable) cardiac defibrillator: Secondary | ICD-10-CM | POA: Diagnosis present

## 2013-12-20 DIAGNOSIS — I5032 Chronic diastolic (congestive) heart failure: Secondary | ICD-10-CM | POA: Diagnosis present

## 2013-12-20 DIAGNOSIS — H269 Unspecified cataract: Secondary | ICD-10-CM | POA: Diagnosis present

## 2013-12-20 DIAGNOSIS — Z7982 Long term (current) use of aspirin: Secondary | ICD-10-CM

## 2013-12-20 DIAGNOSIS — L02219 Cutaneous abscess of trunk, unspecified: Secondary | ICD-10-CM | POA: Diagnosis present

## 2013-12-20 DIAGNOSIS — Z833 Family history of diabetes mellitus: Secondary | ICD-10-CM

## 2013-12-20 DIAGNOSIS — F3289 Other specified depressive episodes: Secondary | ICD-10-CM | POA: Diagnosis present

## 2013-12-20 DIAGNOSIS — I129 Hypertensive chronic kidney disease with stage 1 through stage 4 chronic kidney disease, or unspecified chronic kidney disease: Secondary | ICD-10-CM | POA: Diagnosis present

## 2013-12-20 DIAGNOSIS — N184 Chronic kidney disease, stage 4 (severe): Secondary | ICD-10-CM | POA: Diagnosis present

## 2013-12-20 DIAGNOSIS — E785 Hyperlipidemia, unspecified: Secondary | ICD-10-CM | POA: Diagnosis present

## 2013-12-20 DIAGNOSIS — E039 Hypothyroidism, unspecified: Secondary | ICD-10-CM | POA: Diagnosis present

## 2013-12-20 DIAGNOSIS — Z8249 Family history of ischemic heart disease and other diseases of the circulatory system: Secondary | ICD-10-CM

## 2013-12-20 DIAGNOSIS — Z79899 Other long term (current) drug therapy: Secondary | ICD-10-CM

## 2013-12-20 DIAGNOSIS — I420 Dilated cardiomyopathy: Secondary | ICD-10-CM | POA: Diagnosis present

## 2013-12-20 DIAGNOSIS — I428 Other cardiomyopathies: Secondary | ICD-10-CM

## 2013-12-20 DIAGNOSIS — J45909 Unspecified asthma, uncomplicated: Secondary | ICD-10-CM | POA: Diagnosis present

## 2013-12-20 DIAGNOSIS — I2589 Other forms of chronic ischemic heart disease: Secondary | ICD-10-CM | POA: Diagnosis present

## 2013-12-20 DIAGNOSIS — L03319 Cellulitis of trunk, unspecified: Secondary | ICD-10-CM

## 2013-12-20 DIAGNOSIS — E669 Obesity, unspecified: Secondary | ICD-10-CM | POA: Diagnosis present

## 2013-12-20 DIAGNOSIS — N183 Chronic kidney disease, stage 3 unspecified: Secondary | ICD-10-CM | POA: Diagnosis present

## 2013-12-20 DIAGNOSIS — M1710 Unilateral primary osteoarthritis, unspecified knee: Secondary | ICD-10-CM | POA: Diagnosis present

## 2013-12-20 DIAGNOSIS — Z6841 Body Mass Index (BMI) 40.0 and over, adult: Secondary | ICD-10-CM

## 2013-12-20 DIAGNOSIS — K5900 Constipation, unspecified: Secondary | ICD-10-CM | POA: Diagnosis present

## 2013-12-20 DIAGNOSIS — I480 Paroxysmal atrial fibrillation: Secondary | ICD-10-CM | POA: Diagnosis present

## 2013-12-20 DIAGNOSIS — Z905 Acquired absence of kidney: Secondary | ICD-10-CM

## 2013-12-20 DIAGNOSIS — I5042 Chronic combined systolic (congestive) and diastolic (congestive) heart failure: Secondary | ICD-10-CM | POA: Diagnosis present

## 2013-12-20 DIAGNOSIS — Z888 Allergy status to other drugs, medicaments and biological substances status: Secondary | ICD-10-CM

## 2013-12-20 DIAGNOSIS — I872 Venous insufficiency (chronic) (peripheral): Secondary | ICD-10-CM | POA: Diagnosis present

## 2013-12-20 DIAGNOSIS — K219 Gastro-esophageal reflux disease without esophagitis: Secondary | ICD-10-CM | POA: Diagnosis present

## 2013-12-20 DIAGNOSIS — I4729 Other ventricular tachycardia: Secondary | ICD-10-CM | POA: Diagnosis not present

## 2013-12-20 DIAGNOSIS — Z7901 Long term (current) use of anticoagulants: Secondary | ICD-10-CM

## 2013-12-20 DIAGNOSIS — I252 Old myocardial infarction: Secondary | ICD-10-CM

## 2013-12-20 DIAGNOSIS — M109 Gout, unspecified: Secondary | ICD-10-CM | POA: Diagnosis present

## 2013-12-20 HISTORY — PX: CARDIAC DEFIBRILLATOR REMOVAL: SHX1291

## 2013-12-20 HISTORY — DX: Presence of cardiac pacemaker: Z95.0

## 2013-12-20 HISTORY — PX: ICD LEAD REMOVAL: SHX5855

## 2013-12-20 HISTORY — DX: Chronic kidney disease, unspecified: N18.9

## 2013-12-20 HISTORY — PX: INSERT / REPLACE / REMOVE PACEMAKER: SUR710

## 2013-12-20 SURGERY — REMOVAL, ELECTRODE LEAD, ICD
Anesthesia: General | Site: Chest | Laterality: Left

## 2013-12-20 MED ORDER — WARFARIN SODIUM 7.5 MG PO TABS: 7.5000 mg | ORAL_TABLET | ORAL | Status: DC

## 2013-12-20 MED ORDER — ONDANSETRON HCL 4 MG/2ML IJ SOLN
INTRAMUSCULAR | Status: DC | PRN
  Administered 2013-12-20: 4 mg via INTRAVENOUS

## 2013-12-20 MED ORDER — WARFARIN SODIUM 5 MG PO TABS
5.0000 mg | ORAL_TABLET | ORAL | Status: DC
  Administered 2013-12-21 – 2013-12-24 (×3): 5 mg via ORAL
  Filled 2013-12-20 (×5): qty 1

## 2013-12-20 MED ORDER — PHENYLEPHRINE 40 MCG/ML (10ML) SYRINGE FOR IV PUSH (FOR BLOOD PRESSURE SUPPORT)
PREFILLED_SYRINGE | INTRAVENOUS | Status: AC
  Filled 2013-12-20: qty 10

## 2013-12-20 MED ORDER — LIDOCAINE HCL (PF) 1 % IJ SOLN
INTRAMUSCULAR | Status: AC
  Filled 2013-12-20: qty 30

## 2013-12-20 MED ORDER — LIDOCAINE HCL (PF) 1 % IJ SOLN
INTRAMUSCULAR | Status: DC | PRN
Start: 2013-12-20 — End: 2013-12-20
  Administered 2013-12-20: 30 mL

## 2013-12-20 MED ORDER — WARFARIN - PHYSICIAN DOSING INPATIENT
Freq: Every day | Status: DC
  Administered 2013-12-21: 18:00:00

## 2013-12-20 MED ORDER — DOXYCYCLINE HYCLATE 100 MG PO TABS
100.0000 mg | ORAL_TABLET | Freq: Two times a day (BID) | ORAL | Status: DC
  Administered 2013-12-20 – 2013-12-21 (×3): 100 mg via ORAL
  Filled 2013-12-20 (×5): qty 1

## 2013-12-20 MED ORDER — ONDANSETRON HCL 4 MG/2ML IJ SOLN: 4.0000 mg | Freq: Once | INTRAMUSCULAR | Status: DC | PRN

## 2013-12-20 MED ORDER — AMIODARONE HCL 200 MG PO TABS
200.0000 mg | ORAL_TABLET | Freq: Every day | ORAL | Status: DC
  Administered 2013-12-21 – 2013-12-26 (×6): 200 mg via ORAL
  Filled 2013-12-20 (×6): qty 1

## 2013-12-20 MED ORDER — ISOSORBIDE MONONITRATE ER 60 MG PO TB24
60.0000 mg | ORAL_TABLET | Freq: Every day | ORAL | Status: DC
  Administered 2013-12-20 – 2013-12-26 (×7): 60 mg via ORAL
  Filled 2013-12-20 (×7): qty 1

## 2013-12-20 MED ORDER — FUROSEMIDE 80 MG PO TABS
80.0000 mg | ORAL_TABLET | ORAL | Status: DC
  Administered 2013-12-20 – 2013-12-25 (×3): 80 mg via ORAL
  Filled 2013-12-20 (×3): qty 1

## 2013-12-20 MED ORDER — PROPOFOL 10 MG/ML IV BOLUS
INTRAVENOUS | Status: AC
  Filled 2013-12-20: qty 20

## 2013-12-20 MED ORDER — FUROSEMIDE 40 MG PO TABS: 40.0000 mg | ORAL_TABLET | ORAL | Status: DC

## 2013-12-20 MED ORDER — MIDAZOLAM HCL 2 MG/2ML IJ SOLN
INTRAMUSCULAR | Status: AC
  Filled 2013-12-20: qty 2

## 2013-12-20 MED ORDER — NEOSTIGMINE METHYLSULFATE 10 MG/10ML IV SOLN
INTRAVENOUS | Status: DC | PRN
  Administered 2013-12-20: 3 mg via INTRAVENOUS

## 2013-12-20 MED ORDER — ROCURONIUM BROMIDE 100 MG/10ML IV SOLN
INTRAVENOUS | Status: DC | PRN
  Administered 2013-12-20: 50 mg via INTRAVENOUS

## 2013-12-20 MED ORDER — SODIUM CHLORIDE 0.9 % IR SOLN
Status: DC | PRN
  Administered 2013-12-20: 09:00:00

## 2013-12-20 MED ORDER — ALBUTEROL SULFATE HFA 108 (90 BASE) MCG/ACT IN AERS: 2.0000 | INHALATION_SPRAY | RESPIRATORY_TRACT | Status: DC | PRN

## 2013-12-20 MED ORDER — SUCCINYLCHOLINE CHLORIDE 20 MG/ML IJ SOLN
INTRAMUSCULAR | Status: AC
  Filled 2013-12-20: qty 1

## 2013-12-20 MED ORDER — PHENYLEPHRINE HCL 10 MG/ML IJ SOLN
10.0000 mg | INTRAVENOUS | Status: DC | PRN
  Administered 2013-12-20: 20 ug/min via INTRAVENOUS

## 2013-12-20 MED ORDER — ALBUTEROL SULFATE (2.5 MG/3ML) 0.083% IN NEBU: 2.5000 mg | INHALATION_SOLUTION | RESPIRATORY_TRACT | Status: DC | PRN

## 2013-12-20 MED ORDER — POTASSIUM CHLORIDE CRYS ER 20 MEQ PO TBCR
20.0000 meq | EXTENDED_RELEASE_TABLET | ORAL | Status: DC
  Administered 2013-12-21 – 2013-12-25 (×3): 20 meq via ORAL
  Filled 2013-12-20 (×3): qty 1

## 2013-12-20 MED ORDER — LIDOCAINE HCL (CARDIAC) 20 MG/ML IV SOLN
INTRAVENOUS | Status: AC
  Filled 2013-12-20: qty 5

## 2013-12-20 MED ORDER — BISOPROLOL FUMARATE 10 MG PO TABS
10.0000 mg | ORAL_TABLET | Freq: Every day | ORAL | Status: DC
  Administered 2013-12-21 – 2013-12-26 (×6): 10 mg via ORAL
  Filled 2013-12-20 (×6): qty 1

## 2013-12-20 MED ORDER — VANCOMYCIN HCL 10 G IV SOLR
1250.0000 mg | Freq: Two times a day (BID) | INTRAVENOUS | Status: AC
  Administered 2013-12-20: 1250 mg via INTRAVENOUS
  Filled 2013-12-20: qty 1250

## 2013-12-20 MED ORDER — ONDANSETRON HCL 4 MG/2ML IJ SOLN: 4.0000 mg | Freq: Four times a day (QID) | INTRAMUSCULAR | Status: DC | PRN

## 2013-12-20 MED ORDER — WARFARIN SODIUM 5 MG PO TABS: 5.0000 mg | ORAL_TABLET | ORAL | Status: DC

## 2013-12-20 MED ORDER — FENTANYL CITRATE 0.05 MG/ML IJ SOLN
INTRAMUSCULAR | Status: DC | PRN
  Administered 2013-12-20: 50 ug via INTRAVENOUS
  Administered 2013-12-20: 150 ug via INTRAVENOUS
  Administered 2013-12-20: 50 ug via INTRAVENOUS

## 2013-12-20 MED ORDER — HYDROMORPHONE HCL PF 1 MG/ML IJ SOLN: 0.2500 mg | INTRAMUSCULAR | Status: DC | PRN

## 2013-12-20 MED ORDER — ACETAMINOPHEN 500 MG PO TABS: 500.0000 mg | ORAL_TABLET | Freq: Four times a day (QID) | ORAL | Status: DC | PRN

## 2013-12-20 MED ORDER — BUDESONIDE-FORMOTEROL FUMARATE 160-4.5 MCG/ACT IN AERO
2.0000 | INHALATION_SPRAY | Freq: Two times a day (BID) | RESPIRATORY_TRACT | Status: DC
  Filled 2013-12-20: qty 6

## 2013-12-20 MED ORDER — LEVOTHYROXINE SODIUM 100 MCG PO TABS
100.0000 ug | ORAL_TABLET | Freq: Every day | ORAL | Status: DC
  Administered 2013-12-21 – 2013-12-26 (×5): 100 ug via ORAL
  Filled 2013-12-20 (×7): qty 1

## 2013-12-20 MED ORDER — ACETAMINOPHEN 325 MG PO TABS
325.0000 mg | ORAL_TABLET | ORAL | Status: DC | PRN
  Administered 2013-12-20 – 2013-12-24 (×3): 650 mg via ORAL
  Filled 2013-12-20 (×3): qty 2

## 2013-12-20 MED ORDER — LACTATED RINGERS IV SOLN
INTRAVENOUS | Status: DC | PRN
  Administered 2013-12-20 (×2): via INTRAVENOUS

## 2013-12-20 MED ORDER — ROCURONIUM BROMIDE 50 MG/5ML IV SOLN
INTRAVENOUS | Status: AC
  Filled 2013-12-20: qty 1

## 2013-12-20 MED ORDER — SODIUM CHLORIDE 0.9 % IJ SOLN
INTRAMUSCULAR | Status: AC
  Filled 2013-12-20: qty 10

## 2013-12-20 MED ORDER — GLYCOPYRROLATE 0.2 MG/ML IJ SOLN
INTRAMUSCULAR | Status: DC | PRN
  Administered 2013-12-20: 0.4 mg via INTRAVENOUS

## 2013-12-20 MED ORDER — FENTANYL CITRATE 0.05 MG/ML IJ SOLN
INTRAMUSCULAR | Status: AC
  Filled 2013-12-20: qty 5

## 2013-12-20 MED ORDER — DOCUSATE SODIUM 100 MG PO CAPS: 100.0000 mg | ORAL_CAPSULE | Freq: Every day | ORAL | Status: DC | PRN

## 2013-12-20 MED ORDER — BUDESONIDE-FORMOTEROL FUMARATE 160-4.5 MCG/ACT IN AERO
2.0000 | INHALATION_SPRAY | Freq: Two times a day (BID) | RESPIRATORY_TRACT | Status: DC
  Administered 2013-12-20 – 2013-12-26 (×11): 2 via RESPIRATORY_TRACT
  Filled 2013-12-20: qty 6

## 2013-12-20 MED ORDER — LORATADINE 10 MG PO TABS
10.0000 mg | ORAL_TABLET | Freq: Every morning | ORAL | Status: DC
  Administered 2013-12-20 – 2013-12-26 (×7): 10 mg via ORAL
  Filled 2013-12-20 (×7): qty 1

## 2013-12-20 MED ORDER — EPHEDRINE SULFATE 50 MG/ML IJ SOLN
INTRAMUSCULAR | Status: AC
  Filled 2013-12-20: qty 1

## 2013-12-20 MED ORDER — LIDOCAINE HCL (CARDIAC) 20 MG/ML IV SOLN
INTRAVENOUS | Status: DC | PRN
  Administered 2013-12-20: 100 mg via INTRATRACHEAL
  Administered 2013-12-20: 100 mg via INTRAVENOUS

## 2013-12-20 MED ORDER — IODIXANOL 320 MG/ML IV SOLN
INTRAVENOUS | Status: DC | PRN
  Administered 2013-12-20: 20 mL via INTRAVENOUS

## 2013-12-20 MED ORDER — FENOFIBRATE 160 MG PO TABS
160.0000 mg | ORAL_TABLET | Freq: Every day | ORAL | Status: DC
  Administered 2013-12-20 – 2013-12-26 (×7): 160 mg via ORAL
  Filled 2013-12-20 (×7): qty 1

## 2013-12-20 MED ORDER — ALLOPURINOL 100 MG PO TABS
100.0000 mg | ORAL_TABLET | Freq: Every day | ORAL | Status: DC
  Administered 2013-12-20 – 2013-12-26 (×7): 100 mg via ORAL
  Filled 2013-12-20 (×7): qty 1

## 2013-12-20 MED ORDER — PROPOFOL 10 MG/ML IV BOLUS
INTRAVENOUS | Status: DC | PRN
  Administered 2013-12-20: 200 mg via INTRAVENOUS

## 2013-12-20 MED ORDER — WARFARIN SODIUM 7.5 MG PO TABS
7.5000 mg | ORAL_TABLET | ORAL | Status: DC
  Administered 2013-12-22 – 2013-12-25 (×2): 7.5 mg via ORAL
  Filled 2013-12-20 (×2): qty 1

## 2013-12-20 MED ORDER — ASPIRIN 81 MG PO CHEW
81.0000 mg | CHEWABLE_TABLET | Freq: Every day | ORAL | Status: DC
  Administered 2013-12-20 – 2013-12-26 (×7): 81 mg via ORAL
  Filled 2013-12-20 (×6): qty 1

## 2013-12-20 MED ORDER — FUROSEMIDE 40 MG PO TABS
40.0000 mg | ORAL_TABLET | ORAL | Status: DC
  Administered 2013-12-21 – 2013-12-26 (×4): 40 mg via ORAL
  Filled 2013-12-20 (×4): qty 1

## 2013-12-20 SURGICAL SUPPLY — 60 items
BAG BANDED W/RUBBER/TAPE 36X54 (MISCELLANEOUS) ×3 IMPLANT
BAG DECANTER FOR FLEXI CONT (MISCELLANEOUS) ×6 IMPLANT
BLADE 10 SAFETY STRL DISP (BLADE) ×3 IMPLANT
BLADE STERNUM SYSTEM 6 (BLADE) ×3 IMPLANT
BLADE SURG ROTATE 9660 (MISCELLANEOUS) IMPLANT
BNDG COHESIVE 4X5 WHT NS (GAUZE/BANDAGES/DRESSINGS) ×3 IMPLANT
CANISTER SUCTION 2500CC (MISCELLANEOUS) ×3 IMPLANT
CATH SOFT-VU 4F 65 STRAIGHT (CATHETERS) ×1 IMPLANT
CATH SOFT-VU STRAIGHT 4F 65CM (CATHETERS) ×2
COIL ONE TIE COMPRESSION (MISCELLANEOUS) ×3 IMPLANT
COVER DOME SNAP 22 D (MISCELLANEOUS) ×3 IMPLANT
COVER TABLE BACK 60X90 (DRAPES) ×3 IMPLANT
DEVICE LCKNG LEAD CARDIAC (CATHETERS) ×2 IMPLANT
DRAPE C-ARM 42X72 X-RAY (DRAPES) IMPLANT
DRAPE CARDIOVASCULAR INCISE (DRAPES) ×2
DRAPE INCISE IOBAN 66X45 STRL (DRAPES) IMPLANT
DRAPE PROXIMA HALF (DRAPES) ×6 IMPLANT
DRAPE SRG 135X102X78XABS (DRAPES) ×1 IMPLANT
DRSG TEGADERM 4X4.75 (GAUZE/BANDAGES/DRESSINGS) ×3 IMPLANT
ELECT REM PT RETURN 9FT ADLT (ELECTROSURGICAL) ×3
ELECTRODE REM PT RTRN 9FT ADLT (ELECTROSURGICAL) ×1 IMPLANT
GAUZE PACKING IODOFORM 1 (PACKING) IMPLANT
GAUZE PACKING IODOFORM 1X5 (MISCELLANEOUS) ×3 IMPLANT
GAUZE SPONGE 4X4 16PLY XRAY LF (GAUZE/BANDAGES/DRESSINGS) ×9 IMPLANT
GLOVE BIOGEL PI IND STRL 7.5 (GLOVE) ×1 IMPLANT
GLOVE BIOGEL PI INDICATOR 7.5 (GLOVE) ×2
GLOVE ECLIPSE 8.0 STRL XLNG CF (GLOVE) ×3 IMPLANT
GOWN STRL REUS W/ TWL LRG LVL3 (GOWN DISPOSABLE) ×1 IMPLANT
GOWN STRL REUS W/ TWL XL LVL3 (GOWN DISPOSABLE) ×1 IMPLANT
GOWN STRL REUS W/TWL LRG LVL3 (GOWN DISPOSABLE) ×2
GOWN STRL REUS W/TWL XL LVL3 (GOWN DISPOSABLE) ×2
KIT ROOM TURNOVER OR (KITS) ×3 IMPLANT
KIT WRENCH (KITS) ×3 IMPLANT
LEAD CAPSURE NOVUS 5076-52CM (Lead) ×3 IMPLANT
NS IRRIG 1000ML POUR BTL (IV SOLUTION) IMPLANT
PAD ARMBOARD 7.5X6 YLW CONV (MISCELLANEOUS) ×6 IMPLANT
PAD ELECT DEFIB RADIOL ZOLL (MISCELLANEOUS) ×3 IMPLANT
REMOVAL LLD CARDIAC LEAD EZ (CATHETERS) ×6
SHEATH EVOLUTION SHORTE RL 11F (SHEATH) ×3 IMPLANT
SHEATH TIGHTRAIL MECH (SHEATH) ×1
SHEATH TIGHTRAIL MECH 11F (SHEATH) ×2 IMPLANT
SHEATH TIGHTRAIL MINI 11F (SHEATH) ×3 IMPLANT
SPONGE GAUZE 4X4 12PLY (GAUZE/BANDAGES/DRESSINGS) ×3 IMPLANT
SPONGE GAUZE 4X4 12PLY STER LF (GAUZE/BANDAGES/DRESSINGS) ×3 IMPLANT
STAINLESS STEEL DILATOR SHEATH ×3 IMPLANT
STYLET LIBERATOR LOCKING (MISCELLANEOUS) ×3 IMPLANT
SUT PROLENE 2 0 CT2 30 (SUTURE) IMPLANT
SUT PROLENE 2 0 SH DA (SUTURE) ×3 IMPLANT
SUT SILK 2 0 SH (SUTURE) ×3 IMPLANT
SUT VIC AB 2-0 CT2 18 VCP726D (SUTURE) IMPLANT
SUT VIC AB 3-0 X1 27 (SUTURE) IMPLANT
SYR 20CC LL (SYRINGE) ×6 IMPLANT
TOWEL OR 17X24 6PK STRL BLUE (TOWEL DISPOSABLE) ×6 IMPLANT
TOWEL OR 17X26 10 PK STRL BLUE (TOWEL DISPOSABLE) ×9 IMPLANT
TRAY FOLEY IC TEMP SENS 14FR (CATHETERS) ×3 IMPLANT
TUBE CONNECTING 12'X1/4 (SUCTIONS) ×1
TUBE CONNECTING 12X1/4 (SUCTIONS) ×2 IMPLANT
WIRE .035 3MM-J 145CM (WIRE) ×6 IMPLANT
WIRE SPARTACORE .014X190CM (WIRE) ×3 IMPLANT
YANKAUER SUCT BULB TIP NO VENT (SUCTIONS) ×3 IMPLANT

## 2013-12-20 NOTE — Anesthesia Preprocedure Evaluation (Signed)
Anesthesia Evaluation  Patient identified by MRN, date of birth, ID band Patient awake    Reviewed: Allergy & Precautions, H&P , NPO status , Patient's Chart, lab work & pertinent test results  Airway       Dental   Pulmonary asthma , sleep apnea ,          Cardiovascular hypertension, + CAD and + Past MI + pacemaker     Neuro/Psych  Headaches,    GI/Hepatic GERD-  ,  Endo/Other  Hypothyroidism Morbid obesity  Renal/GU CRFRenal disease     Musculoskeletal   Abdominal   Peds  Hematology   Anesthesia Other Findings   Reproductive/Obstetrics                           Anesthesia Physical Anesthesia Plan  ASA: III  Anesthesia Plan: General   Post-op Pain Management:    Induction: Intravenous  Airway Management Planned: Oral ETT  Additional Equipment:   Intra-op Plan:   Post-operative Plan: Extubation in OR and Possible Post-op intubation/ventilation  Informed Consent: I have reviewed the patients History and Physical, chart, labs and discussed the procedure including the risks, benefits and alternatives for the proposed anesthesia with the patient or authorized representative who has indicated his/her understanding and acceptance.     Plan Discussed with:   Anesthesia Plan Comments:         Anesthesia Quick Evaluation

## 2013-12-20 NOTE — CV Procedure (Addendum)
ICD system extraction and insertion of a temporary permanent PM with left upper extremity venography without immediate complication. Full dictated note to follow. [id number].  Mikle Bosworth.D.

## 2013-12-20 NOTE — Op Note (Signed)
Cody Faulkner, WHITEHURST NO.:  [id number]  MEDICAL RECORD NO.:  [id number]  LOCATION:  2W36C                        FACILITY:  [facility]  PHYSICIAN:  Champ Mungo. Lovena Le, MD    DATE OF BIRTH:  [birth year]  DATE OF PROCEDURE:  12/20/2013 DATE OF DISCHARGE:                              OPERATIVE REPORT   PROCEDURE PERFORMED:  Extraction of a dual-chamber ICD system along with a previous implanted defibrillator lead utilizing left upper extremity venography and insertion of a temporary permanent transvenous pacemaker.  INTRODUCTION:  The patient is a 68 year old man with a longstanding ischemic cardiomyopathy and chronic systolic heart failure.  The patient is status post ICD insertion, initially in 2007.  He underwent insertion of a new ICD lead and insertion of a new device approximately 1 year ago.  He developed cellulitis in his lower extremity and 1 month later was found to have swelling and tenderness at his ICD insertion site.  He had clear cellulitis over this area.  He had very little in the way of fevers or chills.  He did not improve with antibiotic therapy and is now referred for extraction of his entire system.  Of note, the patient has fairly severe sinus node dysfunction with a sinus rate in the low 30s with intact AV conduction.  As such, the patient will undergo insertion of a temporary permanent transvenous pacemaker.  PROCEDURE:  After informed consent was obtained, the patient was taken to the operating room in the fasting state.  Invasive arterial monitoring was placed trough the right radial artery.  Initial attempts to puncture the left internal jugular vein were unsuccessful. 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 There was no evidence of any left jugular vein present.  As such, a 7-cm incision was carried out over the old ICD insertion site  and electrocautery was utilized to dissect down to the ICD pocket.  There was extensive and very dense fibrous adhesion, and there was clear fluid, which had a purulent tinge to it.  It was also somewhat brownish and yellowish in color.  The pocket was irrigated and after extensive electrocautery was utilized to free up the generator, it was removed in total.  The patient's underlying sinus rate was 30-35 beats per minute. Pacing from the atrial lead, the old atrial lead was carried out. Electrocautery was then utilized to free up the very dense fibrous adhesions around all of the pacing and defibrillator leads.  The previously implanted year and a half old active fixation Medtronic defibrillator lead [phone]) was first selected for removal.  The stylet was advanced into the lead body and the helix retracted.  Gentle traction was then placed on the lead body and the 6935 Medtronic ICD lead was removed in total.  Attention was then next directed at the 6949 defibrillation lead.  A stylet was advanced into the tip of the lead and attempts to retract the helix were unsuccessful.  At this point, the lead was cut and a Cook liberator locking stylet was advanced into the defibrillation lead and locked in place.  An One-Tie suture was  placed at the proximal portion of the lead where it connected to the locking stylet.  Next, the [facility] 11-French RL Shortie mechanical extraction sheath was advanced over the lead body and into the subclavian vein. There was very dense fibrous adhesions present.  Next, the [employer] 11-French mechanical extraction sheath was utilized and the defibrillator lead was freed up after the extraction sheath was able to envelope the proximal coil.  Unfortunately, the atrial lead was attached to the old defibrillator lead and came out as well into the coil.  This resulted in dilemma as the tip of the atrial lead was freed up and the back portion of the atrial lead was  still attached to the fascia.  The defibrillator lead was disconnected from the atrial lead and the atrial lead was cut.  At this point, a guidewire was advanced through the extraction sheath into the central circulation to maintain access.  The patient's heart rate was 35 beats per minute and his blood pressure stable.  A [employer] 11-French mini sheath was then inserted over the remaining atrial lead, which was bound up in the subclavian vein, and after very gentle traction placed and pressure placed with the extraction sheath, the remnant of the atrial lead was freed up in total. At this point, a puncture incision was made superior to the extraction incision and the guidewire was retracted through the separate incision. A 7-French active fixation pacing lead was then advanced over an 8- French sheath over the guidewire and into the central circulation. Because the patient had intact AV node conduction, the new atrial lead was actively fixed and advanced out through the skin and so to the fascia with silk suture.  At this point, electrocautery was utilized to debride out the very dense fibrous scar tissue and necrotic tissue, electrocautery was utilized to attempt to assure hemostasis, and the incision was closed with a layer of 2-0 Prolene mattress suture. Iodoform packing gauze was placed in the pocket.  A pressure dressing was placed and the patient was returned to the recovery area in satisfactory condition with a pacing rate of 70 beats per minute.  COMPLICATIONS:  There were no immediate procedure complications.  RESULTS:  This demonstrates successful extraction of a previously implanted model 6949 defibrillation lead, a model 6935 defibrillation lead, and a model 5076 atrial pacing lead all without immediate procedure complication followed by insertion of a new atrial pacing lead in a patient with severe sinus node dysfunction, who developed an ICD pocket  infection.     Champ Mungo. Lovena Le, MD     GWT/MEDQ  D:  12/20/2013  T:  12/20/2013  Job:  [id number]  cc:   Ezzard Standing, M.D. Deboraha Sprang, MD, [facility]

## 2013-12-20 NOTE — Anesthesia Procedure Notes (Signed)
Procedure Name: Intubation Date/Time: 12/20/2013 8:48 AM Performed by: Julian Reil Pre-anesthesia Checklist: Patient identified, Emergency Drugs available, Suction available and Patient being monitored Patient Re-evaluated:Patient Re-evaluated prior to inductionOxygen Delivery Method: Circle system utilized Preoxygenation: Pre-oxygenation with 100% oxygen Intubation Type: IV induction Ventilation: Mask ventilation without difficulty and Oral airway inserted - appropriate to patient size Laryngoscope Size: Mac and 3 Grade View: Grade I Tube type: Oral Tube size: 7.5 mm Number of attempts: 1 Airway Equipment and Method: Stylet Placement Confirmation: ETT inserted through vocal cords under direct vision,  positive ETCO2 and breath sounds checked- equal and bilateral Secured at: 22 cm Tube secured with: Tape Dental Injury: Teeth and Oropharynx as per pre-operative assessment

## 2013-12-20 NOTE — H&P (Signed)
HPI  Cody Faulkner is a 68 y.o. male Who is referred today by Dr. Caryl Comes for ICD pocket infection. His history dates back to 2007 when he had an ICD implanted with an ICD in 2007 for wide-complex tachycardia in the setting of nonischemic cardiomyopathy. He had a 6949 lead replaced at generator replacement with 5935 lead--feb 2014. He developed cellulitis of the lower extremities presumably due to chronic venous stasis. He was seen by Dr. Caryl Comes in early June and put him on antibiotics. At that time his device pocket was normal by inspection. He came in 3 weeks later with erythema over the pocket. He has been treated with a week's worth of antibiotics. He returns today for reinspection. He notes that the pain over his site is improved but still red and tender to palpitation. He denies fever and chills. No recent ICD shocks.  Past Medical History   Diagnosis  Date   .  Atrial fibrillation      Cardioversion 2014   .  Hypertension    .  OSA (obstructive sleep apnea)        .  Depression    .  CAD (coronary artery disease)      predominantly single vessel   .  Ventricular tachycardia      s/p defibrillator-Medtronic EnTrust [id number]   .  Dilated cardiomyopathy      s/p defibrillator Medtronic EnTrust [phone]   .  Asthma    .  Implantable Defibrillator      Medtronic Entrust   .  Hypothyroidism    .  Solitary kidney  06/05/2006   .  Gout    .  Pneumonia, community acquired      bilateral bibasilar   .  Hypothyroidism    .  History of hyperthyroidism    .  Left knee DJD      needs surgery    Past Surgical History   Procedure  Laterality  Date   .  Cardiac defibrillator placement   2007     Medtronic EnTrust [phone]   .  Nephrectomy   1976     Right   .  Cardiac catheterization   2007     60% Stenosis mid-CFX   .  Knee arthroscopy       left   .  Cardioversion   06/18/2011     Procedure: CARDIOVERSION; Surgeon: Kerry Hough., MD; Location: [facility] OR; Service: Cardiovascular; Laterality:  N/A;   .  Tee without cardioversion   07/13/2012     Procedure: TRANSESOPHAGEAL ECHOCARDIOGRAM (TEE); Surgeon: Lelon Perla, MD; Location: [facility] ENDOSCOPY; Service: Cardiovascular; Laterality: N/A;   .  Cardioversion  N/A  12/06/2012     Procedure: CARDIOVERSION; Surgeon: Jacolyn Reedy, MD; Location: [facility] ENDOSCOPY; Service: Cardiovascular; Laterality: N/A;    Current Outpatient Prescriptions   Medication  Sig  Dispense  Refill   .  acetaminophen (TYLENOL) 500 MG tablet  Take 500 mg by mouth every 6 (six) hours as needed.     Marland Kitchen  albuterol (PROVENTIL HFA;VENTOLIN HFA) 108 (90 BASE) MCG/ACT inhaler  Inhale 2 puffs into the lungs every 4 (four) hours as needed. For shortness of breath     .  allopurinol (ZYLOPRIM) 100 MG tablet  daily.     Marland Kitchen  amiodarone (PACERONE) 200 MG tablet  Take 200 mg by mouth daily.     Marland Kitchen  aspirin 81 MG chewable tablet  Chew 81 mg by mouth daily.     Marland Kitchen  bisoprolol (ZEBETA) 10 MG tablet  TAKE ONE TABLET BY MOUTH ONCE DAILY  30 tablet  5   .  budesonide-formoterol (SYMBICORT) 160-4.5 MCG/ACT inhaler  Inhale 2 puffs into the lungs 2 (two) times daily.     .  cephALEXin (KEFLEX) 500 MG capsule  Take 1 capsule (500 mg total) by mouth 3 (three) times daily.  30 capsule  0   .  Cholecalciferol (VITAMIN D) 2000 UNITS tablet  Take 2,000 Units by mouth daily.     .  fenofibrate 160 MG tablet  Take 160 mg by mouth daily.     .  furosemide (LASIX) 40 MG tablet  Take 1 tablet (40 mg total) by mouth daily. Take 2 tablets (80 mg total) by mouth Monday, Wednesday, and Friday.  40 tablet  6   .  isosorbide mononitrate (IMDUR) 60 MG 24 hr tablet  Take 60 mg by mouth daily.     Marland Kitchen  levothyroxine (SYNTHROID, LEVOTHROID) 100 MCG tablet  Take 1 tablet (100 mcg total) by mouth daily.  30 tablet  4   .  loratadine (CLARITIN) 10 MG tablet  Take 10 mg by mouth every morning.     Marland Kitchen  OVER THE COUNTER MEDICATION  100 mg daily. Stool softner     .  potassium chloride SA (K-DUR,KLOR-CON) 20 MEQ tablet   Take 20 mEq by mouth every other day.     .  warfarin (COUMADIN) 5 MG tablet  Take 5-7.5 mg by mouth daily. Monday, Wednesday, and Friday, take 7.5 mg (one and a half tablets)  Tuesday, Thursday, Saturday, Sunday, take 5 mg (one tablet)      No current facility-administered medications for this visit.    Allergies   Allergen  Reactions   .  Methimazole [Thiamazole]  Itching and Rash     Severe rash    Review of Systems negative except from HPI and PMH  Physical Exam  BP 123/69  Pulse 70  Temp(Src) 97.2 F (36.2 C)  Ht 5\' 6"  (1.676 m)  Wt 257 lb (116.574 kg)  BMI 41.50 kg/m2  Progressive erythema and induration of the pocket  Chronically ill but overall stable appearing obese 68 yo man, NAD  HEENT: Unremarkable,[place], AT  Neck: 6 JVD, no thyromegally  Back: No CVA tenderness  Lungs: Clear with no wheezes, rales, or rhonchi  HEART: Regular rate rhythm, no murmurs, no rubs, no clicks  Abd: soft, positive bowel sounds, no organomegally, no rebound, no guarding  Ext: 2 plus pulses, no edema, no cyanosis, no clubbing  Skin: No rashes no nodules  Neuro: CN II through XII intact, motor grossly intact  CXR - reviewed  Assessment and Plan  1. ICD pocket infection  2. H/o Wide QRS tachy  3. Non-ischemic CM  4. PAF  5. Chronic coumadin therapy  Rec: I have discussed the treatment options with the patient. His pocket infection will not be cured with anti-biotics. I have recommended extraction in the next week or so. We would like the patient to hold his coumadin for 3 days prior to the procedure. The risks/benefits/goals/expectations of catheter ablation have been discussed and he wishes to proceed.  Mikle Bosworth.D.

## 2013-12-20 NOTE — Transfer of Care (Signed)
Immediate Anesthesia Transfer of Care Note  Patient: Cody Faulkner  Procedure(s) Performed: Procedure(s): ICD LEAD REMOVAL//EXTRACTION AND PLACEMENT OF TEMP/PERM ATRIAL LEAD (Left)  Patient Location: PACU  Anesthesia Type:General  Level of Consciousness: awake, alert , oriented and patient cooperative  Airway & Oxygen Therapy: Patient Spontanous Breathing and Patient connected to face mask oxygen  Post-op Assessment: Report given to PACU RN, Post -op Vital signs reviewed and stable and Patient moving all extremities  Post vital signs: Reviewed and stable  Complications: No apparent anesthesia complications

## 2013-12-20 NOTE — Progress Notes (Signed)
Orthopedic Tech Progress Note Patient Details:  Cody Faulkner [birth year] [id number]  Ortho Devices Type of Ortho Device: Arm sling Ortho Device/Splint Interventions: Application   [name] 12/20/2013, 1:04 PM

## 2013-12-20 NOTE — Anesthesia Postprocedure Evaluation (Signed)
  Anesthesia Post-op Note  Patient: Cody Faulkner  Procedure(s) Performed: Procedure(s): ICD LEAD REMOVAL//EXTRACTION AND PLACEMENT OF TEMP/PERM ATRIAL LEAD (Left)  Patient Location: PACU  Anesthesia Type:General  Level of Consciousness: awake, alert , oriented and patient cooperative  Airway and Oxygen Therapy: Patient Spontanous Breathing  Post-op Pain: mild  Post-op Assessment: Post-op Vital signs reviewed, Patient's Cardiovascular Status Stable, Respiratory Function Stable, Patent Airway, No signs of Nausea or vomiting and Pain level controlled  Post-op Vital Signs: stable  Last Vitals:  Filed Vitals:   12/20/13 1215  BP: 119/83  Pulse: 70  Temp:   Resp: 17    Complications: No apparent anesthesia complications

## 2013-12-21 ENCOUNTER — Observation Stay (HOSPITAL_COMMUNITY): Payer: Medicare Other

## 2013-12-21 DIAGNOSIS — Z79899 Other long term (current) drug therapy: Secondary | ICD-10-CM | POA: Diagnosis not present

## 2013-12-21 DIAGNOSIS — E039 Hypothyroidism, unspecified: Secondary | ICD-10-CM | POA: Diagnosis present

## 2013-12-21 DIAGNOSIS — T827XXA Infection and inflammatory reaction due to other cardiac and vascular devices, implants and grafts, initial encounter: Principal | ICD-10-CM

## 2013-12-21 DIAGNOSIS — R609 Edema, unspecified: Secondary | ICD-10-CM | POA: Diagnosis present

## 2013-12-21 DIAGNOSIS — J45909 Unspecified asthma, uncomplicated: Secondary | ICD-10-CM | POA: Diagnosis present

## 2013-12-21 DIAGNOSIS — M109 Gout, unspecified: Secondary | ICD-10-CM | POA: Diagnosis present

## 2013-12-21 DIAGNOSIS — I129 Hypertensive chronic kidney disease with stage 1 through stage 4 chronic kidney disease, or unspecified chronic kidney disease: Secondary | ICD-10-CM | POA: Diagnosis present

## 2013-12-21 DIAGNOSIS — I472 Ventricular tachycardia: Secondary | ICD-10-CM | POA: Diagnosis not present

## 2013-12-21 DIAGNOSIS — I4891 Unspecified atrial fibrillation: Secondary | ICD-10-CM | POA: Diagnosis present

## 2013-12-21 DIAGNOSIS — I4729 Other ventricular tachycardia: Secondary | ICD-10-CM | POA: Diagnosis not present

## 2013-12-21 DIAGNOSIS — F3289 Other specified depressive episodes: Secondary | ICD-10-CM | POA: Diagnosis present

## 2013-12-21 DIAGNOSIS — Z8249 Family history of ischemic heart disease and other diseases of the circulatory system: Secondary | ICD-10-CM | POA: Diagnosis not present

## 2013-12-21 DIAGNOSIS — Y831 Surgical operation with implant of artificial internal device as the cause of abnormal reaction of the patient, or of later complication, without mention of misadventure at the time of the procedure: Secondary | ICD-10-CM | POA: Diagnosis present

## 2013-12-21 DIAGNOSIS — I2510 Atherosclerotic heart disease of native coronary artery without angina pectoris: Secondary | ICD-10-CM | POA: Diagnosis present

## 2013-12-21 DIAGNOSIS — N184 Chronic kidney disease, stage 4 (severe): Secondary | ICD-10-CM | POA: Diagnosis present

## 2013-12-21 DIAGNOSIS — N189 Chronic kidney disease, unspecified: Secondary | ICD-10-CM

## 2013-12-21 DIAGNOSIS — Z7982 Long term (current) use of aspirin: Secondary | ICD-10-CM | POA: Diagnosis not present

## 2013-12-21 DIAGNOSIS — F329 Major depressive disorder, single episode, unspecified: Secondary | ICD-10-CM | POA: Diagnosis present

## 2013-12-21 DIAGNOSIS — I495 Sick sinus syndrome: Secondary | ICD-10-CM | POA: Diagnosis present

## 2013-12-21 DIAGNOSIS — I252 Old myocardial infarction: Secondary | ICD-10-CM | POA: Diagnosis not present

## 2013-12-21 DIAGNOSIS — K5900 Constipation, unspecified: Secondary | ICD-10-CM | POA: Diagnosis present

## 2013-12-21 DIAGNOSIS — Z6841 Body Mass Index (BMI) 40.0 and over, adult: Secondary | ICD-10-CM | POA: Diagnosis not present

## 2013-12-21 DIAGNOSIS — I5042 Chronic combined systolic (congestive) and diastolic (congestive) heart failure: Secondary | ICD-10-CM | POA: Diagnosis present

## 2013-12-21 DIAGNOSIS — I2589 Other forms of chronic ischemic heart disease: Secondary | ICD-10-CM | POA: Diagnosis present

## 2013-12-21 DIAGNOSIS — H269 Unspecified cataract: Secondary | ICD-10-CM | POA: Diagnosis present

## 2013-12-21 DIAGNOSIS — Z905 Acquired absence of kidney: Secondary | ICD-10-CM | POA: Diagnosis not present

## 2013-12-21 DIAGNOSIS — I428 Other cardiomyopathies: Secondary | ICD-10-CM

## 2013-12-21 DIAGNOSIS — E785 Hyperlipidemia, unspecified: Secondary | ICD-10-CM | POA: Diagnosis present

## 2013-12-21 DIAGNOSIS — L02219 Cutaneous abscess of trunk, unspecified: Secondary | ICD-10-CM | POA: Diagnosis present

## 2013-12-21 DIAGNOSIS — Z7901 Long term (current) use of anticoagulants: Secondary | ICD-10-CM | POA: Diagnosis not present

## 2013-12-21 DIAGNOSIS — I872 Venous insufficiency (chronic) (peripheral): Secondary | ICD-10-CM | POA: Diagnosis present

## 2013-12-21 DIAGNOSIS — E669 Obesity, unspecified: Secondary | ICD-10-CM | POA: Diagnosis present

## 2013-12-21 DIAGNOSIS — M1710 Unilateral primary osteoarthritis, unspecified knee: Secondary | ICD-10-CM | POA: Diagnosis present

## 2013-12-21 DIAGNOSIS — G4733 Obstructive sleep apnea (adult) (pediatric): Secondary | ICD-10-CM | POA: Diagnosis present

## 2013-12-21 DIAGNOSIS — I5032 Chronic diastolic (congestive) heart failure: Secondary | ICD-10-CM

## 2013-12-21 DIAGNOSIS — Z833 Family history of diabetes mellitus: Secondary | ICD-10-CM | POA: Diagnosis not present

## 2013-12-21 DIAGNOSIS — K219 Gastro-esophageal reflux disease without esophagitis: Secondary | ICD-10-CM | POA: Diagnosis present

## 2013-12-21 DIAGNOSIS — Z888 Allergy status to other drugs, medicaments and biological substances status: Secondary | ICD-10-CM | POA: Diagnosis not present

## 2013-12-21 LAB — PROTIME-INR
INR: 1.36 (ref 0.00–1.49)
Prothrombin Time: 16.8 seconds — ABNORMAL HIGH (ref 11.6–15.2)

## 2013-12-21 MED ORDER — VANCOMYCIN HCL 10 G IV SOLR
1500.0000 mg | INTRAVENOUS | Status: DC
  Administered 2013-12-21 – 2013-12-25 (×5): 1500 mg via INTRAVENOUS
  Filled 2013-12-21 (×5): qty 1500

## 2013-12-21 NOTE — Progress Notes (Signed)
Patient Name: Cody Faulkner      SUBJECTIVE: s/p extraction of ICD 2/2 infection thought seeded from lower exteremity cellulitis Severe sinus node dysfunction prompted placing of temp-perm pacer through the pocket site as alternative access was not available   He has hx of treated VT with NICM/CAD Past Medical History  Diagnosis Date  . Atrial fibrillation     takes COumadin daily  . Depression   . CAD (coronary artery disease)     predominantly single vessel  . Ventricular tachycardia     s/p defibrillator-Medtronic EnTrust [id number]  . Dilated cardiomyopathy     s/p defibrillator Medtronic EnTrust [phone]  . Solitary kidney 06/05/2006  . Gout     takes Allopurinol daily  . History of hyperthyroidism   . Left knee DJD     needs surgery  . Asthma     Albuterol prn;Symbicort daily  . Constipation     takes Colace daily as needed  . Hyperlipidemia     takes Fenofibrate daily  . Seasonal allergies     takes Claritin daily  . Hypertension     takes Bisoprolol daily as well as Imdur  . Myocardial infarction 2007  . Peripheral edema     takes Furosemide daily  . Urinary incontinence   . Shortness of breath     with exertion  . Dizziness     occasionally  . Joint pain   . Joint swelling   . Bruises easily     d/t being on Coumadin  . GERD (gastroesophageal reflux disease)     takes OTC med if needed  . Urinary frequency   . Urinary urgency   . Cataracts, bilateral to be removed in Aug 2015  . Pacemaker   . Pneumonia, community acquired last time 2014    "get it ~ q yr; haven't had it yet in 2015" (12/20/2013)  . OSA (obstructive sleep apnea)     "don't wear mask; can't afford one" (12/20/2013)  . Hypothyroidism     takes Synthroid daily  . Chronic kidney disease 1976    S/P nephrectomy    Scheduled Meds:  Scheduled Meds: . allopurinol  100 mg Oral Daily  . amiodarone  200 mg Oral Daily  . aspirin  81 mg Oral Daily  . bisoprolol  10 mg Oral Daily  .  budesonide-formoterol  2 puff Inhalation BID  . doxycycline  100 mg Oral Q12H  . fenofibrate  160 mg Oral Daily  . furosemide  80 mg Oral Q M,W,F   And  . furosemide  40 mg Oral Q T,Th,S,Su  . isosorbide mononitrate  60 mg Oral Daily  . levothyroxine  100 mcg Oral QAC breakfast  . loratadine  10 mg Oral q morning - 10a  . potassium chloride SA  20 mEq Oral QODAY  . warfarin  5 mg Oral Q T,Th,S,Su-1800   And  . warfarin  7.5 mg Oral Q M,W,F-1800  . Warfarin - Physician Dosing Inpatient   Does not apply q1800   Continuous Infusions:  acetaminophen, albuterol, docusate sodium, ondansetron (ZOFRAN) IV    PHYSICAL EXAM Filed Vitals:   12/20/13 2117 12/21/13 0457 12/21/13 0917 12/21/13 1037  BP: 107/71 112/62  110/60  Pulse: 78 70 75 70  Temp: 97.9 F (36.6 C) 99.1 F (37.3 C)  99.7 F (37.6 C)  TempSrc: Oral Oral  Oral  Resp: 18 19 19 20   SpO2: 94% 96% 96% 93%  General appearance: alert, appears stated age and no distress Lungs: clear to auscultation bilaterally Heart: regular rate and rhythm Abdomen: soft, non-tender; bowel sounds normal; no masses,  no organomegaly and distended Extremities: extremities normal, atraumatic, no cyanosis or edema Skin: Skin color, texture, turgor normal. No rashes or lesions Neurologic: Grossly normal swelling over the left chest consistent with bleeding Temp perm device is under the tagaderm dressing  TELEMETRY: Reviewed telemetry pt in  A pacing :    Intake/Output Summary (Last 24 hours) at 12/21/13 1122 Last data filed at 12/21/13 0900  Gross per 24 hour  Intake    720 ml  Output   1585 ml  Net   -865 ml    LABS: Basic Metabolic Panel:  Recent Labs Lab 12/19/13 1535  NA 143  K 3.9  CL 104  CO2 26  GLUCOSE 102*  BUN 28*  CREATININE 1.94*  CALCIUM 9.4   Cardiac Enzymes: No results found for this basename: CKTOTAL, CKMB, CKMBINDEX, TROPONINI,  in the last 72 hours CBC:  Recent Labs Lab 12/19/13 1535  WBC 7.2    HGB 13.5  HCT 40.6  MCV 85.7  PLT 239   PROTIME:  Recent Labs  12/19/13 1535 12/21/13 0454  LABPROT 19.1* 16.8*  INR 1.60* 1.36   Liver Function Tests: No results found for this basename: AST, ALT, ALKPHOS, BILITOT, PROT, ALBUMIN,  in the last 72 hours No results found for this basename: LIPASE, AMYLASE,  in the last 72 hours BNP: BNP (last 3 results)  Recent Labs  12/31/12 0910  PROBNP 459.6*      ASSESSMENT AND PLAN:  Active Problems:   Atrial fibrillation   Chronic diastolic heart failure   CKD (chronic kidney disease)   ICD (implantable cardioverter-defibrillator) infection   Sinus node dysfunction   Paroxysmal ventricular tachycardia  Pt with symptomatic sinus node dysfunction requiring tempperm pacer placement  Following extraction for pocket infection that seemed likely to have seeded from cellulitis (rx with cephalexin with resolution) The temp perm is deployed through the field so have asked ID to weigh in on options.  One would include trying with [place] to access the IJ, the other is to wait long enough for contralateral implantation  He has bleeding in the pocket so will apply pressure dressing  Will need LIFEVEST as he has had treated VT  Signed, Virl Axe MD  12/21/2013

## 2013-12-21 NOTE — Progress Notes (Signed)
Utilization review completed.  

## 2013-12-21 NOTE — Progress Notes (Signed)
ANTIBIOTIC CONSULT NOTE - INITIAL  Pharmacy Consult for Vancomycin Indication: ICD pocket infection   Allergies  Allergen Reactions  . Methimazole [Thiamazole] Itching and Rash    Severe rash    Patient Measurements: Height: 5' 6.14" (168 cm) Weight: 256 lb 13.4 oz (116.5 kg) IBW/kg (Calculated) : 64.13  Vital Signs: Temp: 98.5 F (36.9 C) (07/16 1347) Temp src: Oral (07/16 1347) BP: 130/74 mmHg (07/16 1347) Pulse Rate: 70 (07/16 1347) Intake/Output from previous day: 07/15 0701 - 07/16 0700 In: 2000 [P.O.:600; I.V.:1400] Out: 1960 [Urine:1910; Blood:50] Intake/Output from this shift: Total I/O In: 240 [P.O.:240] Out: -   Labs:  Recent Labs  12/19/13 1535  WBC 7.2  HGB 13.5  PLT 239  CREATININE 1.94*   Estimated Creatinine Clearance: 43.3 ml/min (by C-G formula based on Cr of 1.94). No results found for this basename: VANCOTROUGH, Corlis Leak, VANCORANDOM, [place], GENTPEAK, GENTRANDOM, TOBRATROUGH, TOBRAPEAK, TOBRARND, AMIKACINPEAK, AMIKACINTROU, AMIKACIN,  in the last 72 hours   Microbiology: Recent Results (from the past 720 hour(s))  CULTURE, BLOOD (SINGLE)     Status: None   Collection Time    12/04/13  4:25 PM      Result Value Ref Range Status   BLOOD CULTURE, ROUTINE Final report   Final   RESULT 1 Comment   Final   Comment: No aerobic or anaerobic growth in five days.  CULTURE, BLOOD (SINGLE)     Status: None   Collection Time    12/04/13  4:27 PM      Result Value Ref Range Status   BLOOD CULTURE, ROUTINE Final report   Final   RESULT 1 Comment   Final   Comment: No aerobic or anaerobic growth in five days.  SURGICAL PCR SCREEN     Status: None   Collection Time    12/19/13  3:35 PM      Result Value Ref Range Status   MRSA, PCR NEGATIVE  NEGATIVE Final   Staphylococcus aureus NEGATIVE  NEGATIVE Final   Comment:            The Xpert SA Assay (FDA     approved for NASAL specimens     in patients over 34 years of age),     is one  component of     a comprehensive surveillance     program.  Test performance has     been validated by [employer] for patients greater     than or equal to 74 year old.     It is not intended     to diagnose infection nor to     guide or monitor treatment.    Medical History: Past Medical History  Diagnosis Date  . Atrial fibrillation     takes COumadin daily  . Depression   . CAD (coronary artery disease)     predominantly single vessel  . Ventricular tachycardia     s/p defibrillator-Medtronic EnTrust [id number]  . Dilated cardiomyopathy     s/p defibrillator Medtronic EnTrust [phone]  . Solitary kidney 06/05/2006  . Gout     takes Allopurinol daily  . History of hyperthyroidism   . Left knee DJD     needs surgery  . Asthma     Albuterol prn;Symbicort daily  . Constipation     takes Colace daily as needed  . Hyperlipidemia     takes Fenofibrate daily  . Seasonal allergies     takes Claritin daily  . Hypertension  takes Bisoprolol daily as well as Imdur  . Myocardial infarction 2007  . Peripheral edema     takes Furosemide daily  . Urinary incontinence   . Shortness of breath     with exertion  . Dizziness     occasionally  . Joint pain   . Joint swelling   . Bruises easily     d/t being on Coumadin  . GERD (gastroesophageal reflux disease)     takes OTC med if needed  . Urinary frequency   . Urinary urgency   . Cataracts, bilateral to be removed in Aug 2015  . Pacemaker   . Pneumonia, community acquired last time 2014    "get it ~ q yr; haven't had it yet in 2015" (12/20/2013)  . OSA (obstructive sleep apnea)     "don't wear mask; can't afford one" (12/20/2013)  . Hypothyroidism     takes Synthroid daily  . Chronic kidney disease 25    S/P nephrectomy   Assessment: 68 yo M s/p ICD removal and temporary PM placement due to suspected ICD pocket infection. Was on doxycycline , ID following, recommended to switch antibiotic to vancomycin for 10-14 days  of treatment. Pt. Received vancomycin 1500 mg pre-op at 0830 and 1250 mg post-op at 2130 yesterday. Scr 1.94, est. crcl ~40 ml/min.   Goal of Therapy:  Vancomycin trough level 15-20 mcg/ml  Plan:  Vancomycin 1500 mg IV Q 24 hrs, next dose tonight 2200 F/u renal function and culture if available  Vancomycin trough at steady state.  Maryanna Shape, PharmD, BCPS  Clinical Pharmacist  Pager: [phone]   12/21/2013,2:42 PM

## 2013-12-21 NOTE — Consult Note (Signed)
[facility] for Infectious Disease    Date of Admission:  12/20/2013   Total days of antibiotics 18         Day 2 doxycycline               Reason for Consult: ICD pocket infection    Referring Physician: Dr. Jolyn Nap   Active Problems:   ICD (implantable cardioverter-defibrillator) infection   Atrial fibrillation   Chronic diastolic heart failure   CKD (chronic kidney disease)   Sinus node dysfunction   Paroxysmal ventricular tachycardia   . allopurinol  100 mg Oral Daily  . amiodarone  200 mg Oral Daily  . aspirin  81 mg Oral Daily  . bisoprolol  10 mg Oral Daily  . budesonide-formoterol  2 puff Inhalation BID  . doxycycline  100 mg Oral Q12H  . fenofibrate  160 mg Oral Daily  . furosemide  80 mg Oral Q M,W,F   And  . furosemide  40 mg Oral Q T,Th,S,Su  . isosorbide mononitrate  60 mg Oral Daily  . levothyroxine  100 mcg Oral QAC breakfast  . loratadine  10 mg Oral q morning - 10a  . potassium chloride SA  20 mEq Oral QODAY  . warfarin  5 mg Oral Q T,Th,S,Su-1800   And  . warfarin  7.5 mg Oral Q M,W,F-1800  . Warfarin - Physician Dosing Inpatient   Does not apply q1800    Recommendations: 1. Change doxycycline to IV vancomycin and then treat for 10-14 days   Assessment: He's developed an ICD pocket infection. I do not have the culture results I. therapy but most likely this do to or staph. Will treat him with IV vancomycin to cover strep, MSSA and MRSA. It would be ideal to have the pacemaker lead replaced so that it does not traverse the infected pocket this is not possible we'll consider the addition of rifampin along with careful monitoring of his INR due to the drug drug interaction of rifampin and warfarin. Current guidelines suggest uncomplicated pocket infection should be treated for 10-14 days. The timing of reimplantation of a new device largely depends on the urgency for reimplantation. I will follow with you.   HPI: Cody Faulkner is a  68 y.o. male with history of nonischemic cardiomyopathy of wide-complex tachycardia who underwent ICD placement in 2007. He underwent generator exchange and defibrillator lead exchange in February of 2014. In early June of this year he was treated for left leg cellulitis with cephalexin. He was seen again by Dr. Caryl Comes on June 29 at which time he developed sudden swelling, redness and tenderness over the ICD pocket. He was not febrile and blood cultures were negative at that time. He was started back on cephalexin but had persistent swelling over the pocket leading to admission and device extraction yesterday. Dr. Tanna Furry operative note indicates that he encountered clear fluid with a purulent pinch and also some areas of brown yellow fluid. It does not appear that any specimens were sent for Gram stain or culture. He was bradycardic and a pacemaker lead was placed. Access was quite limited so it had to be placed through the existing ICD pocket.   Review of Systems: Constitutional: negative for chills, fevers and sweats  Past Medical History  Diagnosis Date  . Atrial fibrillation     takes COumadin daily  . Depression   . CAD (coronary artery disease)     predominantly  single vessel  . Ventricular tachycardia     s/p defibrillator-Medtronic EnTrust [id number]  . Dilated cardiomyopathy     s/p defibrillator Medtronic EnTrust [phone]  . Solitary kidney 06/05/2006  . Gout     takes Allopurinol daily  . History of hyperthyroidism   . Left knee DJD     needs surgery  . Asthma     Albuterol prn;Symbicort daily  . Constipation     takes Colace daily as needed  . Hyperlipidemia     takes Fenofibrate daily  . Seasonal allergies     takes Claritin daily  . Hypertension     takes Bisoprolol daily as well as Imdur  . Myocardial infarction 2007  . Peripheral edema     takes Furosemide daily  . Urinary incontinence   . Shortness of breath     with exertion  . Dizziness     occasionally  . Joint  pain   . Joint swelling   . Bruises easily     d/t being on Coumadin  . GERD (gastroesophageal reflux disease)     takes OTC med if needed  . Urinary frequency   . Urinary urgency   . Cataracts, bilateral to be removed in Aug 2015  . Pacemaker   . Pneumonia, community acquired last time 2014    "get it ~ q yr; haven't had it yet in 2015" (12/20/2013)  . OSA (obstructive sleep apnea)     "don't wear mask; can't afford one" (12/20/2013)  . Hypothyroidism     takes Synthroid daily  . Chronic kidney disease 1976    S/P nephrectomy    History  Substance Use Topics  . Smoking status: Never Smoker   . Smokeless tobacco: Never Used  . Alcohol Use: Yes     Comment: 12/20/2013 "might have drank a couple beers before; never really drank"    Family History  Problem Relation Age of Onset  . Heart disease Other     uncle  . Diabetes Other     uncle   Allergies  Allergen Reactions  . Methimazole [Thiamazole] Itching and Rash    Severe rash    OBJECTIVE: Blood pressure 130/74, pulse 70, temperature 98.5 F (36.9 C), temperature source Oral, resp. rate 19, SpO2 96.00%. General: He is alert and in no distress Skin: No rash Oral: Multiple missing teeth. No oropharyngeal lesions Lungs: Clear Cor: Distant heart sounds Chest: He has a bulky dressing over his previous left upper chest ICD site Abdomen: Obese, soft and nontender Extremities: No active cellulitis  Lab Results Lab Results  Component Value Date   WBC 7.2 12/19/2013   HGB 13.5 12/19/2013   HCT 40.6 12/19/2013   MCV 85.7 12/19/2013   PLT 239 12/19/2013    Lab Results  Component Value Date   CREATININE 1.94* 12/19/2013   BUN 28* 12/19/2013   NA 143 12/19/2013   K 3.9 12/19/2013   CL 104 12/19/2013   CO2 26 12/19/2013    Lab Results  Component Value Date   ALT 19 12/31/2012   AST 18 12/31/2012   ALKPHOS 49 12/31/2012   BILITOT 1.3* 12/31/2012     Microbiology: Recent Results (from the past 240 hour(s))  SURGICAL PCR  SCREEN     Status: None   Collection Time    12/19/13  3:35 PM      Result Value Ref Range Status   MRSA, PCR NEGATIVE  NEGATIVE Final   Staphylococcus aureus NEGATIVE  NEGATIVE Final  Comment:            The Xpert SA Assay (FDA     approved for NASAL specimens     in patients over 28 years of age),     is one component of     a comprehensive surveillance     program.  Test performance has     been validated by [employer] for patients greater     than or equal to 83 year old.     It is not intended     to diagnose infection nor to     guide or monitor treatment.    Michel Bickers, MD [facility] [phone] pager   [phone] cell 12/21/2013, 2:10 PM

## 2013-12-22 ENCOUNTER — Encounter (HOSPITAL_COMMUNITY): Payer: Self-pay | Admitting: Internal Medicine

## 2013-12-22 DIAGNOSIS — Y849 Medical procedure, unspecified as the cause of abnormal reaction of the patient, or of later complication, without mention of misadventure at the time of the procedure: Secondary | ICD-10-CM

## 2013-12-22 DIAGNOSIS — I10 Essential (primary) hypertension: Secondary | ICD-10-CM

## 2013-12-22 LAB — PROTIME-INR
INR: 1.38 (ref 0.00–1.49)
PROTHROMBIN TIME: 17 s — AB (ref 11.6–15.2)

## 2013-12-22 NOTE — Progress Notes (Deleted)
ANTICOAGULATION CONSULT NOTE - Initial Consult  Pharmacy Consult for warfarin Indication: atrial fibrillation  Allergies  Allergen Reactions  . Methimazole [Thiamazole] Itching and Rash    Severe rash    Patient Measurements: Height: 5' 6.14" (168 cm) Weight: 256 lb 13.4 oz (116.5 kg) IBW/kg (Calculated) : 64.13  Vital Signs: Temp: 98.6 F (37 C) (07/17 0500) Temp src: Oral (07/17 0500) BP: 122/89 mmHg (07/17 0500) Pulse Rate: 72 (07/17 0500)  Labs:  Recent Labs  12/19/13 1535 12/21/13 0454 12/22/13 0358  HGB 13.5  --   --   HCT 40.6  --   --   PLT 239  --   --   APTT 38*  --   --   LABPROT 19.1* 16.8* 17.0*  INR 1.60* 1.36 1.38  CREATININE 1.94*  --   --     Estimated Creatinine Clearance: 43.3 ml/min (by C-G formula based on Cr of 1.94).   Medical History: Past Medical History  Diagnosis Date  . Atrial fibrillation     takes COumadin daily  . Depression   . CAD (coronary artery disease)     predominantly single vessel  . Ventricular tachycardia     s/p defibrillator-Medtronic EnTrust [id number]  . Dilated cardiomyopathy     s/p defibrillator Medtronic EnTrust [phone]  . Solitary kidney 06/05/2006  . Gout     takes Allopurinol daily  . History of hyperthyroidism   . Left knee DJD     needs surgery  . Asthma     Albuterol prn;Symbicort daily  . Constipation     takes Colace daily as needed  . Hyperlipidemia     takes Fenofibrate daily  . Seasonal allergies     takes Claritin daily  . Hypertension     takes Bisoprolol daily as well as Imdur  . Myocardial infarction 2007  . Peripheral edema     takes Furosemide daily  . Urinary incontinence   . Shortness of breath     with exertion  . Dizziness     occasionally  . Joint pain   . Joint swelling   . Bruises easily     d/t being on Coumadin  . GERD (gastroesophageal reflux disease)     takes OTC med if needed  . Urinary frequency   . Urinary urgency   . Cataracts, bilateral to be removed in Aug  2015  . Pacemaker   . Pneumonia, community acquired last time 2014    "get it ~ q yr; haven't had it yet in 2015" (12/20/2013)  . OSA (obstructive sleep apnea)     "don't wear mask; can't afford one" (12/20/2013)  . Hypothyroidism     takes Synthroid daily  . Chronic kidney disease 1976    S/P nephrectomy    Medications:  Prescriptions prior to admission  Medication Sig Dispense Refill  . acetaminophen (TYLENOL) 500 MG tablet Take 500 mg by mouth every 6 (six) hours as needed for moderate pain.       Marland Kitchen albuterol (PROVENTIL HFA;VENTOLIN HFA) 108 (90 BASE) MCG/ACT inhaler Inhale 2 puffs into the lungs every 4 (four) hours as needed for shortness of breath.       . allopurinol (ZYLOPRIM) 100 MG tablet Take 100 mg by mouth daily.       Marland Kitchen amiodarone (PACERONE) 200 MG tablet Take 200 mg by mouth daily.       Marland Kitchen aspirin 81 MG chewable tablet Chew 81 mg by mouth daily.      Marland Kitchen  bisoprolol (ZEBETA) 10 MG tablet Take 10 mg by mouth daily.      . budesonide-formoterol (SYMBICORT) 160-4.5 MCG/ACT inhaler Inhale 2 puffs into the lungs 2 (two) times daily.      . Cholecalciferol (VITAMIN D) 2000 UNITS tablet Take 2,000 Units by mouth daily.      Marland Kitchen docusate sodium (COLACE) 100 MG capsule Take 100 mg by mouth daily as needed for mild constipation.      . fenofibrate 160 MG tablet Take 160 mg by mouth daily.      . furosemide (LASIX) 40 MG tablet Take 40-80 mg by mouth See admin instructions. Take 2 tablets by mouth Monday, Wednesday, and Friday.  Take 1 tablet by mouth all other days      . isosorbide mononitrate (IMDUR) 60 MG 24 hr tablet Take 60 mg by mouth daily.      Marland Kitchen levothyroxine (SYNTHROID, LEVOTHROID) 100 MCG tablet Take 1 tablet (100 mcg total) by mouth daily.  30 tablet  4  . loratadine (CLARITIN) 10 MG tablet Take 10 mg by mouth every morning.       . potassium chloride SA (K-DUR,KLOR-CON) 20 MEQ tablet Take 20 mEq by mouth every other day.      . warfarin (COUMADIN) 5 MG tablet Take 5-7.5 mg by  mouth See admin instructions. Take 7.5mg  by mouth onMonday, Wednesday, and Friday.  Take 5mg  tablet Tuesday, Thursday, Saturday, Sunday        Assessment: 68 yo male with hx of afib. On warfarin PTA, held x 3 days for surgery. Pharmacy consulted to restart. Home regimen 7.5mg  MWF, 5mg  all other days restarted 7/16, INR subtherapeutic at 1.38 today.  Goal of Therapy:  INR 2-3 Monitor platelets by anticoagulation protocol: Yes   Plan:  - Continue home Coumadin dose of 7.5mg  MWF, 5mg  TTSS - Daily PT/INR. - Monitor s/sx of bleeding  Albertina Parr, PharmD.  Clinical Pharmacist Pager [phone]

## 2013-12-22 NOTE — Progress Notes (Signed)
Patient ID: Cody Faulkner, male   DOB: 11-11-1945, 68 y.o.   MRN: [id number]         [facility] for Infectious Disease    Date of Admission:  12/20/2013   Total days of antibiotics 19        Day 2 vancomycin         Active Problems:   ICD (implantable cardioverter-defibrillator) infection   Atrial fibrillation   Chronic diastolic heart failure   CKD (chronic kidney disease)   Sinus node dysfunction   Paroxysmal ventricular tachycardia   . allopurinol  100 mg Oral Daily  . amiodarone  200 mg Oral Daily  . aspirin  81 mg Oral Daily  . bisoprolol  10 mg Oral Daily  . budesonide-formoterol  2 puff Inhalation BID  . fenofibrate  160 mg Oral Daily  . furosemide  80 mg Oral Q M,W,F   And  . furosemide  40 mg Oral Q T,Th,S,Su  . isosorbide mononitrate  60 mg Oral Daily  . levothyroxine  100 mcg Oral QAC breakfast  . loratadine  10 mg Oral q morning - 10a  . potassium chloride SA  20 mEq Oral QODAY  . vancomycin  1,500 mg Intravenous Q24H  . warfarin  5 mg Oral Q T,Th,S,Su-1800   And  . warfarin  7.5 mg Oral Q M,W,F-1800  . Warfarin - Physician Dosing Inpatient   Does not apply q1800    Subjective: He has some soreness in his chest but otherwise denies complaints.  Objective: Temp:  [98.5 F (36.9 C)-98.7 F (37.1 C)] 98.6 F (37 C) (07/17 0500) Pulse Rate:  [69-72] 72 (07/17 0500) Resp:  [19-20] 19 (07/17 0500) BP: (122-130)/(74-89) 122/89 mmHg (07/17 0500) SpO2:  [93 %-96 %] 93 % (07/17 0915) Weight:  [116.5 kg (256 lb 13.4 oz)] 116.5 kg (256 lb 13.4 oz) (07/16 1347)  General: Alert and in no distress sitting up watching television Chest: Has a large bulky dressing over his left anterior chest wound  Lab Results Lab Results  Component Value Date   WBC 7.2 12/19/2013   HGB 13.5 12/19/2013   HCT 40.6 12/19/2013   MCV 85.7 12/19/2013   PLT 239 12/19/2013    Lab Results  Component Value Date   CREATININE 1.94* 12/19/2013   BUN 28* 12/19/2013   NA 143 12/19/2013     K 3.9 12/19/2013   CL 104 12/19/2013   CO2 26 12/19/2013    Lab Results  Component Value Date   ALT 19 12/31/2012   AST 18 12/31/2012   ALKPHOS 49 12/31/2012   BILITOT 1.3* 12/31/2012      Microbiology: No cultures available  Assessment: I recommend continuing empiric vancomycin for at least 10 days for ICD generator pocket infection. I will have a PICC placed to facilitate IV therapy.  Plan: 1. Continue vancomycin 2. Place PICC 3. Please call Dr. Carlyle Basques, [phone], for an infectious disease called this weekend   Michel Bickers, MD [facility] for [place] [phone] pager   [phone] cell 12/22/2013, 1:24 PM

## 2013-12-22 NOTE — Progress Notes (Signed)
Pt states he would like [facility] to be his first choice for rehab facilities, as he has been there before and has a relative employed there.  Notified SW.

## 2013-12-22 NOTE — Progress Notes (Signed)
Pt would benefit from PT/OT consults.  Pt is very weakened and just stated that he lives alone, although it was documented that he has a girlfriend.  He says he does not and his only support people are cousins that live in the area.

## 2013-12-22 NOTE — Progress Notes (Signed)
Clinical Social Work Department BRIEF PSYCHOSOCIAL ASSESSMENT 12/22/2013  Patient:  Cody Faulkner, Cody Faulkner     Account Number:  [id number]     Admit date:  12/20/2013  Clinical Social Worker:  Megan Salon  Date/Time:  12/22/2013 11:39 AM  Referred by:  RN  Date Referred:  12/22/2013 Referred for  SNF Placement   Other Referral:   Interview type:  Patient Other interview type:    PSYCHOSOCIAL DATA Living Status:  ALONE Admitted from facility:   Level of care:   Primary support name:  Cody Faulkner Primary support relationship to patient:  FAMILY Degree of support available:   Okay    CURRENT CONCERNS Current Concerns  Post-Acute Placement   Other Concerns:    SOCIAL WORK ASSESSMENT / PLAN Clinical Social Worker received referral for SNF placement at d/c. Clinical Social Worker met with patient at bedside to offer support and discuss patient needs at discharge.  CSW introduced self and explained reason for visit. CSW explained SNF process to patient. Patient reported he is agreeable for SNF placement and prefers [facility].CSW will complete FL2 for MD's signature and will update patient  when bed offers are received.    CSW remains available for support and to facilitate patient discharge needs once medically ready.   Assessment/plan status:  Psychosocial Support/Ongoing Assessment of Needs Other assessment/ plan:   Information/referral to community resources:   SNF information    PATIENT'S/FAMILY'S RESPONSE TO PLAN OF CARE: Patient states he is open to rehab and would like [facility].        Jeanette Caprice, MSW, [place]

## 2013-12-22 NOTE — Clinical Documentation Improvement (Signed)
Presents with pocket infection at pacemaker site. CKD documented.   White male  GFR for this admission is 54  Please clarify the stage of CKD the patient has using the chart below. Please document findings in next progress note and discharge summary.  _______CKD Stage I - GFR > OR = 90 _______CKD Stage II - GFR 60-80 _______CKD Stage III - GFR 30-59 _______CKD Stage IV - GFR 15-29 _______CKD Stage V - GFR < 15 _______ESRD (End Stage Renal Disease) _______Other condition_____________   Angela Adam ,RN Clinical Documentation Specialist:  [phone]  [place] Information Management

## 2013-12-22 NOTE — Progress Notes (Addendum)
Clinical Social Work Department CLINICAL SOCIAL WORK PLACEMENT NOTE 12/22/2013  Patient:  Cody Faulkner, Cody Faulkner  Account Number:  [id number] Admit date:  12/20/2013  Clinical Social Worker:  Megan Salon  Date/time:  12/22/2013 11:41 AM  Clinical Social Work is seeking post-discharge placement for this patient at the following level of care:   [place]   (*CSW will update this form in Epic as items are completed)   12/22/2013  Patient/family provided with [place] Department of Clinical Social Work's list of facilities offering this level of care within the geographic area requested by the patient (or if unable, by the patient's family).  12/22/2013  Patient/family informed of their freedom to choose among providers that offer the needed level of care, that participate in Medicare, Medicaid or managed care program needed by the patient, have an available bed and are willing to accept the patient.  12/22/2013  Patient/family informed of [facility]' ownership interest in [facility], as well as of the fact that they are under no obligation to receive care at this facility.  PASARR submitted to EDS on 12/22/2013 PASARR number received on 12/22/2013  FL2 transmitted to all facilities in geographic area requested by pt/family on  12/22/2013 FL2 transmitted to all facilities within larger geographic area on   Patient informed that his/her managed care company has contracts with or will negotiate with  certain facilities, including the following:     Patient/family informed of bed offers received:  12/25/2013 Patient chooses bed at [facility] Physician recommends and patient chooses bed at    Patient to be transferred to  on  12/26/2013 Patient to be transferred to facility by EMS Patient and family notified of transfer on 12/26/2013 Name of family member notified:    The following physician request were entered in Epic:   Additional  Comments:  Jeanette Caprice, MSW, [place]

## 2013-12-22 NOTE — Evaluation (Signed)
Physical Therapy Evaluation Patient Details Name: Cody Faulkner MRN: [id number] DOB: 10/28/1945 Today's Date: 12/22/2013   History of Present Illness  Pt adm with infected ICD. ICD removed and temporary/permanent pacer placed. PMH - CHF, DJD, resp failure, obesity  Clinical Impression  Pt admitted with above. Pt currently with functional limitations due to the deficits listed below (see PT Problem List).  Pt will benefit from skilled PT to increase their independence and safety with mobility to allow discharge to the venue listed below. Pt lives alone and is unable to mobilize safely without assistance.      Follow Up Recommendations SNF    Equipment Recommendations  None recommended by PT    Recommendations for Other Services       Precautions / Restrictions Precautions Precautions: Fall;ICD/Pacemaker      Mobility  Bed Mobility Overal bed mobility: Needs Assistance Bed Mobility: Supine to Sit     Supine to sit: Min assist     General bed mobility comments: Assist to bring trunk up.  Transfers Overall transfer level: Needs assistance Equipment used: Rolling walker (2 wheeled) Transfers: Sit to/from Stand Sit to Stand: Min assist         General transfer comment: Assist to bring hips up and for balance.  Ambulation/Gait Ambulation/Gait assistance: Min assist;+2 safety/equipment Ambulation Distance (Feet): 150 Feet Assistive device: Rolling walker (2 wheeled) Gait Pattern/deviations: Step-through pattern;Decreased step length - right;Decreased step length - left;Trunk flexed     General Gait Details: Verbal cues to stand more erect and to stay closer to walker. Pt tends to begin incr speed and needs cues to slow back down.  Stairs            Wheelchair Mobility    Modified Rankin (Stroke Patients Only)       Balance Overall balance assessment: Needs assistance Sitting-balance support: No upper extremity supported;Feet supported Sitting  balance-[name]: Fair     Standing balance support: Bilateral upper extremity supported Standing balance-[name]: Poor Standing balance comment: support of walker                             Pertinent Vitals/Pain VSS    Home Living Family/patient expects to be discharged to:: Skilled nursing facility Living Arrangements: Alone Available Help at Discharge: Family;Available PRN/intermittently (cousin) Type of Home: Mobile home Home Access: Ramped entrance Entrance Stairs-Rails: Right;Left;Can reach both   Home Layout: One level Home Equipment: Walker - 2 wheels;Cane - single point      Prior Function Level of Independence: Independent with assistive device(s)         Comments: Used cane     Hand Dominance   Dominant Hand: Right    Extremity/Trunk Assessment   Upper Extremity Assessment: Defer to OT evaluation           Lower Extremity Assessment: Generalized weakness         Communication   Communication: HOH  Cognition Arousal/Alertness: Awake/alert Behavior During Therapy: WFL for tasks assessed/performed Overall Cognitive Status: No family/caregiver present to determine baseline cognitive functioning       Memory: Decreased short-term memory              General Comments      Exercises        Assessment/Plan    PT Assessment Patient needs continued PT services  PT Diagnosis Difficulty walking;Generalized weakness   PT Problem List Decreased strength;Decreased activity tolerance;Decreased balance;Decreased mobility;Decreased knowledge  of use of DME;Decreased knowledge of precautions  PT Treatment Interventions DME instruction;Gait training;Balance training;Patient/family education;Functional mobility training;Therapeutic activities;Therapeutic exercise   PT Goals (Current goals can be found in the Care Plan section) Acute Rehab PT Goals Patient Stated Goal: Eventually return home PT Goal Formulation: With  patient Time For Goal Achievement: 12/29/13 Potential to Achieve Goals: Good    Frequency Min 3X/week   Barriers to discharge        Co-evaluation               End of Session Equipment Utilized During Treatment: Gait belt Activity Tolerance: Patient tolerated treatment well Patient left: in chair;with call bell/phone within reach Nurse Communication: Mobility status         Time: 2751-7001 PT Time Calculation (min): 11 min   Charges:   PT Evaluation $Initial PT Evaluation Tier I: 1 Procedure PT Treatments $Gait Training: 8-22 mins   PT G Codes:          Keyana Guevara 2014-01-10, 11:58 AM  Suanne Marker PT [phone]

## 2013-12-22 NOTE — Progress Notes (Addendum)
SUBJECTIVE: The patient is doing well today.  At this time, he denies chest pain, shortness of breath, or any new concerns.  He still has moderate incisional soreness.  Pressure dressing in place.  ID consult yesterday recommended 14 days of IV Vancomycin.  CURRENT MEDICATIONS: . allopurinol  100 mg Oral Daily  . amiodarone  200 mg Oral Daily  . aspirin  81 mg Oral Daily  . bisoprolol  10 mg Oral Daily  . budesonide-formoterol  2 puff Inhalation BID  . fenofibrate  160 mg Oral Daily  . furosemide  80 mg Oral Q M,W,F   And  . furosemide  40 mg Oral Q T,Th,S,Su  . isosorbide mononitrate  60 mg Oral Daily  . levothyroxine  100 mcg Oral QAC breakfast  . loratadine  10 mg Oral q morning - 10a  . potassium chloride SA  20 mEq Oral QODAY  . vancomycin  1,500 mg Intravenous Q24H  . warfarin  5 mg Oral Q T,Th,S,Su-1800   And  . warfarin  7.5 mg Oral Q M,W,F-1800  . Warfarin - Physician Dosing Inpatient   Does not apply q1800      OBJECTIVE: Physical Exam: Filed Vitals:   12/21/13 1347 12/21/13 2005 12/21/13 2100 12/22/13 0500  BP: 130/74  124/79 122/89  Pulse: 70 69 70 72  Temp: 98.5 F (36.9 C)  98.7 F (37.1 C) 98.6 F (37 C)  TempSrc: Oral  Oral Oral  Resp: 19 20 19 19   Height: 5' 6.14" (1.68 m)     Weight: 256 lb 13.4 oz (116.5 kg)     SpO2: 96% 93% 95% 96%    Intake/Output Summary (Last 24 hours) at 12/22/13 0847 Last data filed at 12/22/13 0424  Gross per 24 hour  Intake    360 ml  Output    450 ml  Net    -90 ml    Telemetry reveals atrial pacing with intrinsic ventricular conduction  GEN- The patient is ill appearing, alert and oriented x 3 today.   Head- normocephalic, atraumatic Eyes-  Sclera clear, conjunctiva pink Ears- hearing intact Oropharynx- clear Neck- supple,   Lungs- Clear to ausculation bilaterally, normal work of breathing Heart- Regular rate and rhythm (paced) GI- soft, NT, ND, + BS Extremities- no clubbing, cyanosis, or edema Skin-  L chest pacemaker dressing remains in place Psych- euthymic mood, full affect Neuro- strength and sensation are intact  LABS: Basic Metabolic Panel:  Recent Labs  12/19/13 1535  NA 143  K 3.9  CL 104  CO2 26  GLUCOSE 102*  BUN 28*  CREATININE 1.94*  CALCIUM 9.4   CBC:  Recent Labs  12/19/13 1535  WBC 7.2  HGB 13.5  HCT 40.6  MCV 85.7  PLT 239    RADIOLOGY: Dg Chest 2 View 12/21/2013   CLINICAL DATA:  Pacemaker replacement.  EXAM: CHEST  2 VIEW  COMPARISON:  Two-view chest x-ray 12/19/2013, 07/14/2012.  FINDINGS: Interval removal of the pacing defibrillator with placement of a new single lead atrial pacer via the left subclavian vein. No evidence of pneumothorax or mediastinal hematoma. Lead tip positioned at the expected location of the right atrial appendage. Cardiac silhouette normal in size, unchanged. Thoracic aorta atherosclerotic and mildly tortuous, unchanged. Hilar and mediastinal contours otherwise unremarkable. Azygos fissure. Lungs clear. Bronchovascular markings normal. Pulmonary vascularity normal. No visible pleural effusions. No pneumothorax. Degenerative changes throughout the thoracic spine with exaggeration of the usual thoracic kyphosis.  IMPRESSION: 1. New single  lead left subclavian pacemaker with the lead tip at the expected location of the right atrial appendage. No acute complicating features. 2.  No acute cardiopulmonary disease.   Electronically Signed   By: Evangeline Dakin M.D.   On: 12/21/2013 08:43   Dg Chest 2 View 12/19/2013   CLINICAL DATA:  Hypertension.  EXAM: CHEST  2 VIEW  COMPARISON:  January 02, 2013.  FINDINGS: Stable cardiomediastinal silhouette. Left-sided pacemaker is unchanged in position. No pneumothorax or pleural effusion is noted. No acute pulmonary disease is noted. Bony thorax is intact.  IMPRESSION: No acute cardiopulmonary abnormality seen.   Electronically Signed   By: Sabino Dick M.D.   On: 12/19/2013 16:31    ASSESSMENT AND PLAN:   Active Problems:   Atrial fibrillation   Chronic diastolic heart failure   CKD (chronic kidney disease)   ICD (implantable cardioverter-defibrillator) infection   Sinus node dysfunction   Paroxysmal ventricular tachycardia  1. ICD pocket infection S/p extraction yesterday Appears to be doing well Pt does not wish for me to remove his dressing today.  Dr Lovena Le can remove tomorrow Temp/perm device is in place Per Dr Caryl Comes he will need LifeVest arranged for discharge.  This has been ordered PICC line for IV antibiotics per ID  2. nonischemic cardiomyopathy with h/o VT As above Appears to be euvolemic today  3. Sick sinus syndrome As above  4. htn Stable No change required today  5. Chronic stage IV renal failure Stable No change required today  Anticipate discharge tomorrow to SNF as per PT recommendation SW has been consulted.

## 2013-12-22 NOTE — Progress Notes (Signed)
Attempted PICC insertion in left upper arm.  Able to cannulate Cephalic vein easily but PICC would not advance into SVC .  Kept coiling back on itself.  Notified staff RN

## 2013-12-23 DIAGNOSIS — Z7901 Long term (current) use of anticoagulants: Secondary | ICD-10-CM

## 2013-12-23 LAB — PROTIME-INR
INR: 1.33 (ref 0.00–1.49)
Prothrombin Time: 16.5 seconds — ABNORMAL HIGH (ref 11.6–15.2)

## 2013-12-23 NOTE — Progress Notes (Signed)
Clinical Education officer, museum ([place]) stated [name] authorization via provider link. CSW contacted [facility] and [facility] admissions coordinator reported that she will review information and will likely be able to accept patient Monday 12/25/13. CSW will continue to follow and assist as needed.   Blima Rich, [place] Weekend CSW [phone]

## 2013-12-23 NOTE — Progress Notes (Addendum)
    Subjective:  No complaints except- "didn't sleep well"  Objective:  Vital Signs in the last 24 hours: Temp:  [97.5 F (36.4 C)-98.7 F (37.1 C)] 97.5 F (36.4 C) (07/18 0329) Pulse Rate:  [70] 70 (07/18 0329) Resp:  [18] 18 (07/18 0329) BP: (101-107)/(48-55) 101/55 mmHg (07/18 0329) SpO2:  [93 %-94 %] 94 % (07/18 0329)  Intake/Output from previous day:  Intake/Output Summary (Last 24 hours) at 12/23/13 0847 Last data filed at 12/23/13 0611  Gross per 24 hour  Intake    360 ml  Output   1625 ml  Net  -1265 ml    Physical Exam: General appearance: alert, cooperative, no distress and moderately obese Lungs: decreased breath sounds Heart: regular rate and rhythm   Rate: 70  Rhythm: paced  Lab Results: No results found for this basename: WBC, HGB, PLT,  in the last 72 hours No results found for this basename: NA, K, CL, CO2, GLUCOSE, BUN, CREATININE,  in the last 72 hours No results found for this basename: TROPONINI, CK, MB,  in the last 72 hours  Recent Labs  12/23/13 0325  INR 1.33    Imaging: Imaging results have been reviewed  Cardiac Studies:  Assessment/Plan:  68 y.o. Male, followed by Dr Wynonia Lawman, admitted 12/20/13 for ICD pocket infection. His history dates back to 2007 when he had an ICD implanted in 2007 for wide-complex tachycardia in the setting of nonischemic cardiomyopathy. He had a 6949 lead replaced at generator replacement with 6935 lead--feb 2014. He had been seen as an OP with pain and swelling over his pacemaker site. He had been on ABs as an OP. He was seen in follow up and it was deiced to admit him ICD extraction. This was done 12/20/13 by Dr Lovena Le. He has an external temporary pacemaker. The plan is to discharge him to SNF for 10 days of Vancomycin. PICC line was attempted but unsuccessful yesterday.     Active Problems:   ICD (implantable cardioverter-defibrillator) infection   Atrial fibrillation   Medtronic ICD   CKD (chronic kidney  disease) stage 3, GFR 30-59 ml/min   History of dilated cardiomyopathy   Chronic diastolic heart failure   Sinus node dysfunction   Paroxysmal ventricular tachycardia    PLAN:    # 9/10 Vanc. Coumadin resumed. Life Vest to be fitted this am. Dr Lovena Le to see- ? Can we use Rt arm for PICC. Transfer to SNF if he can be accepted over the weekend and PICC line place.   Kerin Ransom PA-C Beeper [phone] 12/23/2013, 8:47 AM  EP Attending  Patient seen and examined. Agree with above with modifications. The patient cannot go to SNF this weekend. We will give IV anti-biotics over weekend and transition to po. Will hold off on PICC line. He should heal without IV anti-biotics. He will require re-insertion of a new ICD in 3-4 weeks. Note plans for life vest. Dressing removed and approx. 100 cc of serosanguinous fluid removed.  Mikle Bosworth.D.

## 2013-12-23 NOTE — Progress Notes (Signed)
Social work notified of pt's need for rehab placement with Lifevest and PICC. Social work states [employer] requires prior authorization and that she will start the process for that today. Per social work, bed offers might take until Monday. Will continue to monitor.

## 2013-12-24 LAB — PROTIME-INR
INR: 1.28 (ref 0.00–1.49)
PROTHROMBIN TIME: 16 s — AB (ref 11.6–15.2)

## 2013-12-24 NOTE — Progress Notes (Signed)
Patient ID: AKIF WELDY, male   DOB: Jul 14, 1945, 68 y.o.   MRN: [id number]   Patient Name: Cody Faulkner Date of Encounter: 12/24/2013     Active Problems:   Atrial fibrillation   Medtronic ICD   History of dilated cardiomyopathy- last EF 50-55% 2D July 2014   Chronic diastolic heart failure   CAD nonobstructive- 2007   CKD (chronic kidney disease) stage 3, GFR 30-59 ml/min   ICD (implantable cardioverter-defibrillator) infection   Sinus node dysfunction   Paroxysmal ventricular tachycardia   Chronic anticoagulation- Coumadin    SUBJECTIVE  Minimal incisional soreness, denies chest pain or sob.  CURRENT MEDS . allopurinol  100 mg Oral Daily  . amiodarone  200 mg Oral Daily  . aspirin  81 mg Oral Daily  . bisoprolol  10 mg Oral Daily  . budesonide-formoterol  2 puff Inhalation BID  . fenofibrate  160 mg Oral Daily  . furosemide  80 mg Oral Q M,W,F   And  . furosemide  40 mg Oral Q T,Th,S,Su  . isosorbide mononitrate  60 mg Oral Daily  . levothyroxine  100 mcg Oral QAC breakfast  . loratadine  10 mg Oral q morning - 10a  . potassium chloride SA  20 mEq Oral QODAY  . vancomycin  1,500 mg Intravenous Q24H  . warfarin  5 mg Oral Q T,Th,S,Su-1800   And  . warfarin  7.5 mg Oral Q M,W,F-1800  . Warfarin - Physician Dosing Inpatient   Does not apply q1800    OBJECTIVE  Filed Vitals:   12/23/13 1953 12/23/13 2033 12/24/13 0230 12/24/13 0722  BP: 119/78  123/80   Pulse: 70 67 70   Temp: 98.4 F (36.9 C)  97.3 F (36.3 C)   TempSrc: Oral  Oral   Resp: 18 18 18    Height:      Weight:      SpO2: 97% 97% 97% 96%    Intake/Output Summary (Last 24 hours) at 12/24/13 0937 Last data filed at 12/24/13 0400  Gross per 24 hour  Intake    240 ml  Output   1450 ml  Net  -1210 ml   Filed Weights   12/21/13 1347  Weight: 256 lb 13.4 oz (116.5 kg)    PHYSICAL EXAM  General: Pleasant, NAD. Neuro: Alert and oriented X 3. Moves all extremities spontaneously. Psych:  Normal affect. HEENT:  Normal  Neck: Supple without bruits or JVD. Lungs:  Resp regular and unlabored, CTA. Dressing clean and dry. Heart: RRR no s3, s4, or murmurs. Abdomen: Soft, non-tender, non-distended, BS + x 4.  Extremities: No clubbing, cyanosis or edema. DP/PT/Radials 2+ and equal bilaterally.  Accessory Clinical Findings  CBC No results found for this basename: WBC, NEUTROABS, HGB, HCT, MCV, PLT,  in the last 72 hours Basic Metabolic Panel No results found for this basename: NA, K, CL, CO2, GLUCOSE, BUN, CREATININE, CALCIUM, MG, PHOS,  in the last 72 hours Liver Function Tests No results found for this basename: AST, ALT, ALKPHOS, BILITOT, PROT, ALBUMIN,  in the last 72 hours No results found for this basename: LIPASE, AMYLASE,  in the last 72 hours Cardiac Enzymes No results found for this basename: CKTOTAL, CKMB, CKMBINDEX, TROPONINI,  in the last 72 hours BNP No components found with this basename: POCBNP,  D-Dimer No results found for this basename: DDIMER,  in the last 72 hours Hemoglobin A1C No results found for this basename: HGBA1C,  in the last 72 hours Fasting  Lipid Panel No results found for this basename: CHOL, HDL, LDLCALC, TRIG, CHOLHDL, LDLDIRECT,  in the last 72 hours Thyroid Function Tests No results found for this basename: TSH, T4TOTAL, FREET3, T3FREE, THYROIDAB,  in the last 72 hours  TELE  NSR with atrial pacing  Radiology/Studies  Dg Chest 2 View  12/21/2013   CLINICAL DATA:  Pacemaker replacement.  EXAM: CHEST  2 VIEW  COMPARISON:  Two-view chest x-ray 12/19/2013, 07/14/2012.  FINDINGS: Interval removal of the pacing defibrillator with placement of a new single lead atrial pacer via the left subclavian vein. No evidence of pneumothorax or mediastinal hematoma. Lead tip positioned at the expected location of the right atrial appendage. Cardiac silhouette normal in size, unchanged. Thoracic aorta atherosclerotic and mildly tortuous, unchanged. Hilar  and mediastinal contours otherwise unremarkable. Azygos fissure. Lungs clear. Bronchovascular markings normal. Pulmonary vascularity normal. No visible pleural effusions. No pneumothorax. Degenerative changes throughout the thoracic spine with exaggeration of the usual thoracic kyphosis.  IMPRESSION: 1. New single lead left subclavian pacemaker with the lead tip at the expected location of the right atrial appendage. No acute complicating features. 2.  No acute cardiopulmonary disease.   Electronically Signed   By: Evangeline Dakin M.D.   On: 12/21/2013 08:43   Dg Chest 2 View  12/19/2013   CLINICAL DATA:  Hypertension.  EXAM: CHEST  2 VIEW  COMPARISON:  January 02, 2013.  FINDINGS: Stable cardiomediastinal silhouette. Left-sided pacemaker is unchanged in position. No pneumothorax or pleural effusion is noted. No acute pulmonary disease is noted. Bony thorax is intact.  IMPRESSION: No acute cardiopulmonary abnormality seen.   Electronically Signed   By: Sabino Dick M.D.   On: 12/19/2013 16:31    ASSESSMENT AND PLAN 1. ICD infection, s/p extraction 2. Sinus node dysfunction with sinus rate in the low 30's s/p insertion of a temp perm atrial lead 3. H/o VT Rec: plan for transfer to snf for rehab with lifevest tomorrow. His incision will take several weeks to heal. I would anticipate device re-insertion in 3-4 weeks.  Donica Derouin,M.D.  12/24/2013 9:37 AM

## 2013-12-24 NOTE — Progress Notes (Signed)
Spoke with Cody Faulkner, pt's relative concerning Lifevest fitting to take place tomorrow (7/20) at 5:30pm, per Claib[facility] Billings at [facility]. Family states they will try to be present at that time.

## 2013-12-24 NOTE — Progress Notes (Signed)
ANTIBIOTIC CONSULT NOTE - Follow Up   Pharmacy Consult for Vancomycin Indication: ICD pocket infection   Allergies  Allergen Reactions  . Methimazole [Thiamazole] Itching and Rash    Severe rash    Patient Measurements: Height: 5' 6.14" (168 cm) Weight: 256 lb 13.4 oz (116.5 kg) IBW/kg (Calculated) : 64.13  Vital Signs: Temp: 97.3 F (36.3 C) (07/19 0230) Temp src: Oral (07/19 0230) BP: 123/80 mmHg (07/19 0230) Pulse Rate: 70 (07/19 0230) Intake/Output from previous day: 07/18 0701 - 07/19 0700 In: 360 [P.O.:360] Out: 1650 [Urine:1400; Stool:250] Intake/Output from this shift: Total I/O In: 120 [P.O.:120] Out: 200 [Urine:200]  Labs: No results found for this basename: WBC, HGB, PLT, LABCREA, CREATININE,  in the last 72 hours Estimated Creatinine Clearance: 43.3 ml/min (by C-G formula based on Cr of 1.94). No results found for this basename: VANCOTROUGH, VANCOPEAK, VANCORANDOM, GENTTROUGH, GENTPEAK, GENTRANDOM, TOBRATROUGH, TOBRAPEAK, TOBRARND, AMIKACINPEAK, AMIKACINTROU, AMIKACIN,  in the last 72 hours   Assessment: 68 yo M s/p ICD removal and temporary PM placement due to suspected ICD pocket infection. Was on doxycycline , ID following, recommended to switch antibiotic to vancomycin for 10-14 days of abx treatment. Plan is to likely transition to oral abx on Monday. UOP remains stable. Pt remains afebrile.   Goal of Therapy:  Vancomycin trough level 15-20 mcg/ml  Plan:  Vancomycin 1500 mg IV Q 24 hrs F/u renal function and culture if available  Planning to transition to oral abx on Monday. Will hold off on collecting trough   Albertina Parr, PharmD.  Clinical Pharmacist Pager [phone]

## 2013-12-25 ENCOUNTER — Inpatient Hospital Stay (HOSPITAL_COMMUNITY): Payer: Medicare Other

## 2013-12-25 DIAGNOSIS — Y831 Surgical operation with implant of artificial internal device as the cause of abnormal reaction of the patient, or of later complication, without mention of misadventure at the time of the procedure: Secondary | ICD-10-CM

## 2013-12-25 LAB — BASIC METABOLIC PANEL
ANION GAP: 12 (ref 5–15)
BUN: 29 mg/dL — ABNORMAL HIGH (ref 6–23)
CHLORIDE: 101 meq/L (ref 96–112)
CO2: 25 mEq/L (ref 19–32)
CREATININE: 1.36 mg/dL — AB (ref 0.50–1.35)
Calcium: 8.9 mg/dL (ref 8.4–10.5)
GFR, EST AFRICAN AMERICAN: 60 mL/min — AB (ref >90)
GFR, EST NON AFRICAN AMERICAN: 52 mL/min — AB (ref >90)
Glucose, Bld: 92 mg/dL (ref 70–99)
Potassium: 4.2 mEq/L (ref 3.7–5.3)
Sodium: 138 mEq/L (ref 137–147)

## 2013-12-25 LAB — CBC
HCT: 35.9 % — ABNORMAL LOW (ref 39.0–52.0)
Hemoglobin: 11.7 g/dL — ABNORMAL LOW (ref 13.0–17.0)
MCH: 28.5 pg (ref 26.0–34.0)
MCHC: 32.6 g/dL (ref 30.0–36.0)
MCV: 87.6 fL (ref 78.0–100.0)
PLATELETS: 169 10*3/uL (ref 150–400)
RBC: 4.1 MIL/uL — ABNORMAL LOW (ref 4.22–5.81)
RDW: 15.2 % (ref 11.5–15.5)
WBC: 4.7 10*3/uL (ref 4.0–10.5)

## 2013-12-25 LAB — VANCOMYCIN, TROUGH: Vancomycin Tr: 13 ug/mL (ref 10.0–20.0)

## 2013-12-25 LAB — PROTIME-INR
INR: 1.34 (ref 0.00–1.49)
Prothrombin Time: 16.6 seconds — ABNORMAL HIGH (ref 11.6–15.2)

## 2013-12-25 MED ORDER — VANCOMYCIN HCL 10 G IV SOLR
1750.0000 mg | INTRAVENOUS | Status: DC
  Filled 2013-12-25: qty 1750

## 2013-12-25 NOTE — Progress Notes (Signed)
,skr       Patient Name: Cody Faulkner      SUBJECTIVE: zwithout compaints   Past Medical History  Diagnosis Date  . Atrial fibrillation     takes COumadin daily  . Depression   . CAD (coronary artery disease)     predominantly single vessel  . Ventricular tachycardia     s/p defibrillator-Medtronic [employer] [id number]  . Dilated cardiomyopathy     s/p defibrillator Medtronic [employer] [phone]  . Solitary kidney 06/05/2006  . Gout     takes Allopurinol daily  . History of hyperthyroidism   . Left knee DJD     needs surgery  . Asthma     Albuterol prn;Symbicort daily  . Constipation     takes Colace daily as needed  . Hyperlipidemia     takes Fenofibrate daily  . Seasonal allergies     takes Claritin daily  . Hypertension     takes Bisoprolol daily as well as Imdur  . Myocardial infarction 2007  . Peripheral edema     takes Furosemide daily  . Urinary incontinence   . Shortness of breath     with exertion  . Dizziness     occasionally  . Joint pain   . Joint swelling   . Bruises easily     d/t being on Coumadin  . GERD (gastroesophageal reflux disease)     takes OTC med if needed  . Urinary frequency   . Urinary urgency   . Cataracts, bilateral to be removed in Aug 2015  . Pacemaker   . Pneumonia, community acquired last time 2014    "get it ~ q yr; haven't had it yet in 2015" (12/20/2013)  . OSA (obstructive sleep apnea)     "don't wear mask; can't afford one" (12/20/2013)  . Hypothyroidism     takes Synthroid daily  . Chronic kidney disease 1976    S/P nephrectomy    Scheduled Meds:  Scheduled Meds: . allopurinol  100 mg Oral Daily  . amiodarone  200 mg Oral Daily  . aspirin  81 mg Oral Daily  . bisoprolol  10 mg Oral Daily  . budesonide-formoterol  2 puff Inhalation BID  . fenofibrate  160 mg Oral Daily  . furosemide  80 mg Oral Q M,W,F   And  . furosemide  40 mg Oral Q T,Th,S,Su  . isosorbide mononitrate  60 mg Oral Daily  . levothyroxine  100 mcg  Oral QAC breakfast  . loratadine  10 mg Oral q morning - 10a  . potassium chloride SA  20 mEq Oral QODAY  . vancomycin  1,500 mg Intravenous Q24H  . warfarin  5 mg Oral Q T,Th,S,Su-1800   And  . warfarin  7.5 mg Oral Q M,W,F-1800  . Warfarin - Physician Dosing Inpatient   Does not apply q1800   Continuous Infusions:  acetaminophen, albuterol, docusate sodium, ondansetron (ZOFRAN) IV    PHYSICAL EXAM Filed Vitals:   12/24/13 1942 12/24/13 2125 12/25/13 0356 12/25/13 0722  BP: 94/53  144/82   Pulse: 70 70 70   Temp: 98.4 F (36.9 C)  98.1 F (36.7 C)   TempSrc: Oral  Oral   Resp: 18 18 20    Height:      Weight:   252 lb 13.9 oz (114.7 kg)   SpO2: 95% 99% 95% 96%    Well developed and nourished in no acute distress HENT normal Neck supple with JVP-flat Clear Regular rate and rhythm,  no murmurs or gallops Abd-soft with active BS No Clubbing cyanosis edema Skin-warm and dry A & Oriented  Grossly normal sensory and motor function   TELEMETRY: Reviewed telemetry pt in a pacing    Intake/Output Summary (Last 24 hours) at 12/25/13 0819 Last data filed at 12/25/13 0814  Gross per 24 hour  Intake    240 ml  Output   1850 ml  Net  -1610 ml    LABS: Basic Metabolic Panel:  Recent Labs Lab 12/19/13 1535  NA 143  K 3.9  CL 104  CO2 26  GLUCOSE 102*  BUN 28*  CREATININE 1.94*  CALCIUM 9.4   Cardiac Enzymes: No results found for this basename: CKTOTAL, CKMB, CKMBINDEX, TROPONINI,  in the last 72 hours CBC:  Recent Labs Lab 12/19/13 1535  WBC 7.2  HGB 13.5  HCT 40.6  MCV 85.7  PLT 239   PROTIME:  Recent Labs  12/23/13 0325 12/24/13 0341 12/25/13 0335  LABPROT 16.5* 16.0* 16.6*  INR 1.33 1.28 1.34   Liver Function Tests: No results found for this basename: AST, ALT, ALKPHOS, BILITOT, PROT, ALBUMIN,  in the last 72 hours No results found for this basename: LIPASE, AMYLASE,  in the last 72 hours BNP: BNP (last 3 results)  Recent Labs   12/31/12 0910  PROBNP 459.6*   D-Dimer: No results found for this basename: DDIMER,  in the last 72 hours Hemoglobin A1C: No results found for this basename: HGBA1C,  in the last 72 hours Fasting Lipid Panel: No results found for this basename: CHOL, HDL, LDLCALC, TRIG, CHOLHDL, LDLDIRECT,  in the last 72 hours   ASSESSMENT AND PLAN:  Principal Problem:   ICD (implantable cardioverter-defibrillator) infection Active Problems:   Sinus node dysfunction   Paroxysmal ventricular tachycardia   Atrial fibrillation   Medtronic ICD   History of dilated cardiomyopathy- last EF 50-55% 2D July 2014   Chronic diastolic heart failure   CAD nonobstructive- 2007   CKD (chronic kidney disease) stage 3, GFR 30-59 ml/min   Chronic anticoagulation- Coumadin  Plan is to rehab with life vest and  Device reimplantation Will place PICC per ID rec Will check BMET  Signed, Virl Axe MD  12/25/2013

## 2013-12-25 NOTE — Evaluation (Signed)
Occupational Therapy Evaluation Patient Details Name: Cody Faulkner MRN: [id number] DOB: Feb 14, 1946 Today's Date: 12/25/2013    History of Present Illness Pt adm with infected ICD. ICD removed and temporary/permanent pacer placed. PMH - CHF, DJD, resp failure, obesity.  Fit for life vest 12/25/2013 pm.   Clinical Impression   Patient presents during OT evaluation with decreased activity tolerance, decreased sit and stand balance, decreased general strength, and limited use of Left Upper Extremity s/p ICD/Pacemaker removal and temporary replacement. Patient now requires min to mod assist for basic self care tasks.  Patient was independent with ADL/IADL prior to this infection and surgery.  Patient will benefit from skilled OT intervention to increase independence with ADL.  Patient plans short term SNF prior to return to Independent living.      Follow Up Recommendations  SNF    Equipment Recommendations  None recommended by OT (would benefit from tub bench prior to discharge back home)    Recommendations for Other Services       Precautions / Restrictions Precautions Precautions: Fall;ICD/Pacemaker Restrictions Weight Bearing Restrictions: Yes Other Position/Activity Restrictions: Pacemaker - limited range of motion, and weight bearing through left UE      Mobility Bed Mobility Overal bed mobility: Needs Assistance                Transfers Overall transfer level: Needs assistance Equipment used: Rolling walker (2 wheeled) Transfers: Sit to/from Stand;Stand Pivot Transfers Sit to Stand: Min assist Stand pivot transfers: Min assist            Balance Overall balance assessment: Needs assistance Sitting-balance support: No upper extremity supported;Feet supported Sitting balance-[name]: Fair     Standing balance support: Bilateral upper extremity supported;During functional activity Standing balance-[name]: Poor                               ADL Overall ADL's : Needs assistance/impaired Eating/Feeding: Set up;Sitting   Grooming: Sitting;Wash/dry hands;Wash/dry face;Oral care;Applying deodorant;Brushing hair;Set up   Upper Body Bathing: Sitting;Minimal assitance   Lower Body Bathing: Moderate assistance;Sit to/from stand   Upper Body Dressing : Minimal assistance;Sitting   Lower Body Dressing: Moderate assistance;Sit to/from stand Lower Body Dressing Details (indicate cue type and reason): Difficulty reaching feet - body habitus.  Patient has been introduced to and may own reacher and sock aide Toilet Transfer: Minimal assistance;RW   Toileting- Clothing Manipulation and Hygiene: Moderate assistance;Sit to/from stand       Functional mobility during ADLs: Minimal assistance General ADL Comments: Stands with wide BOS     Vision                     Perception Perception Perception Tested?: No   Praxis Praxis Praxis tested?: Not tested    Pertinent Vitals/Pain None noted during evaluation, yet does report soreness in upper chest- left from recent surgery     Hand Dominance Right   Extremity/Trunk Assessment Upper Extremity Assessment Upper Extremity Assessment: LUE deficits/detail;Generalized weakness LUE Deficits / Details: status post ICD/ pacemaker   Lower Extremity Assessment Lower Extremity Assessment: Defer to PT evaluation;Generalized weakness   Cervical / Trunk Assessment Cervical / Trunk Assessment: Normal   Communication Communication Communication: No difficulties;HOH   Cognition Arousal/Alertness: Awake/alert Behavior During Therapy: WFL for tasks assessed/performed Overall Cognitive Status: Within Functional Limits for tasks assessed  General Comments       Exercises       Shoulder Instructions      Home Living Family/patient expects to be discharged to:: Skilled nursing facility Living Arrangements: Alone Available Help at Discharge:  Family;Available 24 hours/day Type of Home: Mobile home Home Access: Ramped entrance   Entrance Stairs-Rails: Right;Left;Can reach both Home Layout: One level               Home Equipment: Walker - 2 wheels;Cane - single point;Bedside commode   Additional Comments: Patient plans discharge to [facility]      Prior Functioning/Environment Level of Independence: Independent with assistive device(s)             OT Diagnosis: Generalized weakness;Acute pain   OT Problem List: Decreased strength;Impaired balance (sitting and/or standing);Decreased range of motion;Cardiopulmonary status limiting activity;Increased edema;Decreased activity tolerance;Decreased knowledge of use of DME or AE   OT Treatment/Interventions: Self-care/ADL training;DME and/or AE instruction;Therapeutic activities;Balance training;Therapeutic exercise;Energy conservation;Patient/family education    OT Goals(Current goals can be found in the care plan section) Acute Rehab OT Goals Patient Stated Goal: short term rehab to strengthen himself before surgery Time For Goal Achievement: 01/08/14 Potential to Achieve Goals: Good  OT Frequency: Min 2X/week   Barriers to D/C:            Co-evaluation              End of Session Equipment Utilized During Treatment: Rolling walker  Activity Tolerance: Patient tolerated treatment well Patient left: in chair;with call bell/phone within reach   Time: 9150-5697 OT Time Calculation (min): 27 min Charges:  OT General Charges $OT Visit: 1 Procedure OT Evaluation $Initial OT Evaluation Tier I: 1 Procedure OT Treatments $Self Care/Home Management : 8-22 mins G-Codes:    Mariah Milling 12-27-2013, 9:19 AM

## 2013-12-25 NOTE — Progress Notes (Signed)
ANTIBIOTIC CONSULT NOTE - Follow Up   Pharmacy Consult for Vancomycin Indication: ICD pocket infection   Allergies  Allergen Reactions  . Methimazole [Thiamazole] Itching and Rash    Severe rash    Patient Measurements: Height: 5' 6.14" (168 cm) Weight: 252 lb 13.9 oz (114.7 kg) IBW/kg (Calculated) : 64.13  Vital Signs: Temp: 97.6 F (36.4 C) (07/20 1940) Temp src: Oral (07/20 1940) BP: 113/56 mmHg (07/20 1940) Pulse Rate: 70 (07/20 1940) Intake/Output from previous day: 07/19 0701 - 07/20 0700 In: 240 [P.O.:240] Out: 1650 [Urine:1650] Intake/Output from this shift:    Labs:  Recent Labs  12/25/13 1000  WBC 4.7  HGB 11.7*  PLT 169  CREATININE 1.36*   Estimated Creatinine Clearance: 61.1 ml/min (by C-G formula based on Cr of 1.36).  Recent Labs  12/25/13 2120  Taylor 13.0     Assessment: 68 yo M s/p ICD removal and temporary PM placement due to suspected ICD pocket infection. Plan to complete 8 more days of abx. Vanc trough came back at 13 today which is pretty close to goal. Will try to keep pt on a q24 dosing for convenient.   Goal of Therapy:  Vancomycin trough level 15-20 mcg/ml  Plan:   Change vanc to 1.75g IV q24 F/u renal function and culture if available

## 2013-12-25 NOTE — Procedures (Signed)
LUE  DL POWER PICC, tip is peripheral in the left cephalic vein Central venous occlusion No comp Stable Use as peripheral access only

## 2013-12-25 NOTE — Progress Notes (Addendum)
CSW (Clinical Education officer, museum) received call from [employer] inquiring about dc. Pt does have bed today if medically stable, but if needed pt will have bed available tomorrow as well. Facility in contact with insurance to get authorization.  Addendum: CSW received call from [facility] that pt has been granted authorization. Pt auth number is [id number]. [employer] to inform facility. CSW also received transport auth from insurance Josem Kaufmann #[id number])  [place], [place]

## 2013-12-25 NOTE — Progress Notes (Signed)
Patient ID: Cody Faulkner, male   DOB: 1945-07-17, 68 y.o.   MRN: [id number]         [facility]    Date of Admission:  12/20/2013           Day 6 vancomycin  Principal Problem:   ICD (implantable cardioverter-defibrillator) infection Active Problems:   Atrial fibrillation   Medtronic ICD   History of dilated cardiomyopathy- last EF 50-55% 2D July 2014   Chronic diastolic heart failure   CAD nonobstructive- 2007   CKD (chronic kidney disease) stage 3, GFR 30-59 ml/min   Sinus node dysfunction   Paroxysmal ventricular tachycardia   Chronic anticoagulation- Coumadin   . allopurinol  100 mg Oral Daily  . amiodarone  200 mg Oral Daily  . aspirin  81 mg Oral Daily  . bisoprolol  10 mg Oral Daily  . budesonide-formoterol  2 puff Inhalation BID  . fenofibrate  160 mg Oral Daily  . furosemide  80 mg Oral Q M,W,F   And  . furosemide  40 mg Oral Q T,Th,S,Su  . isosorbide mononitrate  60 mg Oral Daily  . levothyroxine  100 mcg Oral QAC breakfast  . loratadine  10 mg Oral q morning - 10a  . potassium chloride SA  20 mEq Oral QODAY  . vancomycin  1,500 mg Intravenous Q24H  . warfarin  5 mg Oral Q T,Th,S,Su-1800   And  . warfarin  7.5 mg Oral Q M,W,F-1800  . Warfarin - Physician Dosing Inpatient   Does not apply q1800    Subjective: He is feeling better.  Objective: Temp:  [97.3 F (36.3 C)-98.4 F (36.9 C)] 98.1 F (36.7 C) (07/20 0356) Pulse Rate:  [70-100] 100 (07/20 0826) Resp:  [18-20] 20 (07/20 0356) BP: (94-144)/(53-83) 105/83 mmHg (07/20 0826) SpO2:  [95 %-99 %] 96 % (07/20 0722) Weight:  [114.7 kg (252 lb 13.9 oz)] 114.7 kg (252 lb 13.9 oz) (07/20 0356)  General: He is comfortable and in no distress sitting up in a chair watching television Clean dry bulky dressing over her left anterior chest wound  Lab Results Lab Results  Component Value Date   WBC 7.2 12/19/2013   HGB 13.5 12/19/2013   HCT 40.6 12/19/2013   MCV 85.7 12/19/2013   PLT 239 12/19/2013    Lab Results  Component Value Date   CREATININE 1.94* 12/19/2013   BUN 28* 12/19/2013   NA 143 12/19/2013   K 3.9 12/19/2013   CL 104 12/19/2013   CO2 26 12/19/2013    Lab Results  Component Value Date   ALT 19 12/31/2012   AST 18 12/31/2012   ALKPHOS 49 12/31/2012   BILITOT 1.3* 12/31/2012      Microbiology: Recent Results (from the past 240 hour(s))  SURGICAL PCR SCREEN     Status: None   Collection Time    12/19/13  3:35 PM      Result Value Ref Range Status   MRSA, PCR NEGATIVE  NEGATIVE Final   Staphylococcus aureus NEGATIVE  NEGATIVE Final   Comment:            The Xpert SA Assay (FDA     approved for NASAL specimens     in patients over 46 years of age),     is one component of     a comprehensive surveillance     program.  Test performance has     been validated by [facility] for patients greater     than or equal to 65 year old.     It is not intended     to diagnose infection nor to     guide or monitor treatment.    Studies/Results: No results found.  Assessment: He appears to be improving on therapy for ICD generator pocket infection. Continue empiric vancomycin for 8 more days.  Plan: 1. Recommend vancomycin for 8 more days through July 28 2. PICC placement 3. I will sign off now please call if I can be of further assistance  Michel Bickers, MD [facility] for [place] [phone] pager   [phone] cell 12/25/2013, 10:40 AM

## 2013-12-26 LAB — PROTIME-INR
INR: 1.38 (ref 0.00–1.49)
Prothrombin Time: 17 seconds — ABNORMAL HIGH (ref 11.6–15.2)

## 2013-12-26 MED ORDER — SODIUM CHLORIDE 0.9 % IJ SOLN
10.0000 mL | INTRAMUSCULAR | Status: DC | PRN
  Administered 2013-12-26 (×2): 10 mL

## 2013-12-26 MED ORDER — HEPARIN SOD (PORK) LOCK FLUSH 100 UNIT/ML IV SOLN
250.0000 [IU] | INTRAVENOUS | Status: AC | PRN
  Administered 2013-12-26: 250 [IU]

## 2013-12-26 MED ORDER — VANCOMYCIN HCL 10 G IV SOLR: 1750.0000 mg | INTRAVENOUS | Status: AC

## 2013-12-26 NOTE — Progress Notes (Signed)
Physical Therapy Treatment Patient Details Name: TRACY KINNER MRN: [id number] DOB: 08/02/1945 Today's Date: January 11, 2014    History of Present Illness Pt adm with infected ICD. ICD removed and temporary/permanent pacer placed. PMH - CHF, DJD, resp failure, obesity.  Fit for life vest 12/25/2013 pm.    PT Comments    Pt making steady progress.  Follow Up Recommendations  SNF     Equipment Recommendations  None recommended by PT    Recommendations for Other Services       Precautions / Restrictions Precautions Precautions: Fall;ICD/Pacemaker Restrictions Weight Bearing Restrictions: Yes    Mobility  Bed Mobility                  Transfers Overall transfer level: Needs assistance Equipment used: Rolling walker (2 wheeled) Transfers: Sit to/from Stand Sit to Stand: Min assist         General transfer comment: Assist to bring hips up and for balance.  Ambulation/Gait Ambulation/Gait assistance: Min assist Ambulation Distance (Feet): 200 Feet Assistive device: Rolling walker (2 wheeled) Gait Pattern/deviations: Step-through pattern;Decreased stride length;Trunk flexed;Wide base of support     General Gait Details: Verbal cues to stand more erect and to stay closer to walker.   Stairs            Wheelchair Mobility    Modified Rankin (Stroke Patients Only)       Balance           Standing balance support: Bilateral upper extremity supported Standing balance-[name]: Poor Standing balance comment: support of walker                    Cognition Arousal/Alertness: Awake/alert Behavior During Therapy: WFL for tasks assessed/performed Overall Cognitive Status: Within Functional Limits for tasks assessed                      Exercises      General Comments        Pertinent Vitals/Pain No c/o's    Home Living                      Prior Function            PT Goals (current goals can now be found  in the care plan section) Progress towards PT goals: Progressing toward goals    Frequency  Min 3X/week    PT Plan Current plan remains appropriate    Co-evaluation             End of Session Equipment Utilized During Treatment: Gait belt Activity Tolerance: Patient tolerated treatment well Patient left: in chair;with call bell/phone within reach     Time: 1038-1050 PT Time Calculation (min): 12 min  Charges:  $Gait Training: 8-22 mins                    G Codes:      Makinlee Awwad 2014-01-11, 11:56 AM  Suanne Marker PT [phone]

## 2013-12-26 NOTE — Discharge Instructions (Signed)
Continue vancomycin at current dose through 07/28. Last dose is to be on July 28.

## 2013-12-26 NOTE — Progress Notes (Signed)
Clinical Social Worker facilitated patient discharge including contacting patient and facility to confirm patient discharge plans.  Clinical information faxed to facility and patient agreeable with plan.  CSW arranged ambulance transport via [facility] to [facility] for 1pm. Patient states he called family to inform of dc and states social worker does not have to call family. RN to call report prior to discharge.  Clinical Social Worker will sign off for now as social work intervention is no longer needed. Please consult [place] again if new need arises.  Jeanette Caprice, MSW, [place]

## 2013-12-26 NOTE — Progress Notes (Signed)
Patient ID: Cody Faulkner, male   DOB: 07/21/1945, 68 y.o.   MRN: [id number]   Patient Name: Cody Faulkner Date of Encounter: 12/26/2013     Principal Problem:   ICD (implantable cardioverter-defibrillator) infection Active Problems:   Atrial fibrillation   Medtronic ICD   History of dilated cardiomyopathy- last EF 50-55% 2D July 2014   Chronic diastolic heart failure   CAD nonobstructive- 2007   CKD (chronic kidney disease) stage 3, GFR 30-59 ml/min   Sinus node dysfunction   Paroxysmal ventricular tachycardia   Chronic anticoagulation- Coumadin    SUBJECTIVE  No chest pain or sob. No fever or chills.  CURRENT MEDS . allopurinol  100 mg Oral Daily  . amiodarone  200 mg Oral Daily  . aspirin  81 mg Oral Daily  . bisoprolol  10 mg Oral Daily  . budesonide-formoterol  2 puff Inhalation BID  . fenofibrate  160 mg Oral Daily  . furosemide  80 mg Oral Q M,W,F   And  . furosemide  40 mg Oral Q T,Th,S,Su  . isosorbide mononitrate  60 mg Oral Daily  . levothyroxine  100 mcg Oral QAC breakfast  . loratadine  10 mg Oral q morning - 10a  . potassium chloride SA  20 mEq Oral QODAY  . vancomycin  1,750 mg Intravenous Q24H  . warfarin  5 mg Oral Q T,Th,S,Su-1800   And  . warfarin  7.5 mg Oral Q M,W,F-1800  . Warfarin - Physician Dosing Inpatient   Does not apply q1800    OBJECTIVE  Filed Vitals:   12/26/13 0541 12/26/13 0744 12/26/13 0858 12/26/13 0925  BP: 143/94 146/71 127/64   Pulse: 70 70 70   Temp: 98.1 F (36.7 C)  98.8 F (37.1 C)   TempSrc: Oral  Oral   Resp: 18  16   Height:      Weight:      SpO2: 97%  97% 96%    Intake/Output Summary (Last 24 hours) at 12/26/13 1035 Last data filed at 12/26/13 0903  Gross per 24 hour  Intake    720 ml  Output   1600 ml  Net   -880 ml   Filed Weights   12/21/13 1347 12/25/13 0356  Weight: 256 lb 13.4 oz (116.5 kg) 252 lb 13.9 oz (114.7 kg)    PHYSICAL EXAM  General: Pleasant, NAD. Neuro: Alert and oriented X 3.  Moves all extremities spontaneously. Psych: Normal affect. HEENT:  Normal  Neck: Supple without bruits or JVD. Lungs:  Resp regular and unlabored, CTA. Bandage in place over ICD lead extraction site Heart: RRR no s3, s4, or murmurs. Abdomen: Soft, non-tender, non-distended, BS + x 4.  Extremities: No clubbing, cyanosis or edema. DP/PT/Radials 2+ and equal bilaterally.  Accessory Clinical Findings  CBC  Recent Labs  12/25/13 1000  WBC 4.7  HGB 11.7*  HCT 35.9*  MCV 87.6  PLT 416   Basic Metabolic Panel  Recent Labs  12/25/13 1000  NA 138  K 4.2  CL 101  CO2 25  GLUCOSE 92  BUN 29*  CREATININE 1.36*  CALCIUM 8.9   Liver Function Tests No results found for this basename: AST, ALT, ALKPHOS, BILITOT, PROT, ALBUMIN,  in the last 72 hours No results found for this basename: LIPASE, AMYLASE,  in the last 72 hours Cardiac Enzymes No results found for this basename: CKTOTAL, CKMB, CKMBINDEX, TROPONINI,  in the last 72 hours BNP No components found with this basename: POCBNP,  D-Dimer No results found for this basename: DDIMER,  in the last 72 hours Hemoglobin A1C No results found for this basename: HGBA1C,  in the last 72 hours Fasting Lipid Panel No results found for this basename: CHOL, HDL, LDLCALC, TRIG, CHOLHDL, LDLDIRECT,  in the last 72 hours Thyroid Function Tests No results found for this basename: TSH, T4TOTAL, FREET3, T3FREE, THYROIDAB,  in the last 72 hours  TELE  NSR with atrial pacing  Radiology/Studies  Dg Chest 2 View  12/21/2013   CLINICAL DATA:  Pacemaker replacement.  EXAM: CHEST  2 VIEW  COMPARISON:  Two-view chest x-ray 12/19/2013, 07/14/2012.  FINDINGS: Interval removal of the pacing defibrillator with placement of a new single lead atrial pacer via the left subclavian vein. No evidence of pneumothorax or mediastinal hematoma. Lead tip positioned at the expected location of the right atrial appendage. Cardiac silhouette normal in size, unchanged.  Thoracic aorta atherosclerotic and mildly tortuous, unchanged. Hilar and mediastinal contours otherwise unremarkable. Azygos fissure. Lungs clear. Bronchovascular markings normal. Pulmonary vascularity normal. No visible pleural effusions. No pneumothorax. Degenerative changes throughout the thoracic spine with exaggeration of the usual thoracic kyphosis.  IMPRESSION: 1. New single lead left subclavian pacemaker with the lead tip at the expected location of the right atrial appendage. No acute complicating features. 2.  No acute cardiopulmonary disease.   Electronically Signed   By: Evangeline Dakin M.D.   On: 12/21/2013 08:43   Dg Chest 2 View  12/19/2013   CLINICAL DATA:  Hypertension.  EXAM: CHEST  2 VIEW  COMPARISON:  January 02, 2013.  FINDINGS: Stable cardiomediastinal silhouette. Left-sided pacemaker is unchanged in position. No pneumothorax or pleural effusion is noted. No acute pulmonary disease is noted. Bony thorax is intact.  IMPRESSION: No acute cardiopulmonary abnormality seen.   Electronically Signed   By: Sabino Dick M.D.   On: 12/19/2013 16:31   Ir Fluoro Guide Cv Line Left  12/25/2013   CLINICAL DATA:  Coronary disease, cardiomyopathy, no current access, plan for pacer revision  EXAM: POWER PICC LINE PLACEMENT WITH ULTRASOUND AND FLUOROSCOPIC GUIDANCE  FLUOROSCOPY TIME:  2 min 30 seconds  PROCEDURE: The patient was advised of the possible risks andcomplications and agreed to undergo the procedure. The patient was then brought to the angiographic suite for the procedure.  The right arm was prepped with chlorhexidine, drapedin the usual sterile fashion using maximum barrier technique (cap and mask, sterile gown, sterile gloves, large sterile sheet, hand hygiene and cutaneous antisepsis) and infiltrated locally with 1% Lidocaine.  Ultrasound demonstrated patency of the right cephalic vein, and this was documented with an image. Under real-time ultrasound guidance, this vein was accessed with a 21  gauge micropuncture needle and image documentation was performed. A 0.018 wire was introduced in to the vein. Guidewire would not advance centrally past the existing left subclavian pacer wire compatible with central occlusion. Therefore, the PICC line was cut short to 24 cm and left peripherally in the left cephalic vein at the level of the left shoulder. This should be used as peripheral access only. Fluoroscopy during the procedure and fluoro spot radiograph confirms appropriate catheter position. The catheter was flushed and covered with asterile dressing.  Catheter length: 24 cm  Complications: No immediate  IMPRESSION: Successful Right arm power PICC line placement with ultrasound and fluoroscopic guidance. PICC line tip is within the left cephalic vein peripherally as described. The catheter is ready for use.   Electronically Signed   By: Tamera Punt.D.  On: 12/25/2013 14:14   Ir US Guide Vasc Access Left  12/25/2013   CLINICAL DATA:  Coronary disease, cardiomyopathy, no current access, plan for pacer revision  EXAM: POWER PICC LINE PLACEMENT WITH ULTRASOUND AND FLUOROSCOPIC GUIDANCE  FLUOROSCOPY TIME:  2 min 30 seconds  PROCEDURE: The patient was advised of the possible risks andcomplications and agreed to undergo the procedure. The patient was then brought to the angiographic suite for the procedure.  The right arm was prepped with chlorhexidine, drapedin the usual sterile fashion using maximum barrier technique (cap and mask, sterile gown, sterile gloves, large sterile sheet, hand hygiene and cutaneous antisepsis) and infiltrated locally with 1% Lidocaine.  Ultrasound demonstrated patency of the right cephalic vein, and this was documented with an image. Under real-time ultrasound guidance, this vein was accessed with a 21 gauge micropuncture needle and image documentation was performed. A 0.018 wire was introduced in to the vein. Guidewire would not advance centrally past the existing left  subclavian pacer wire compatible with central occlusion. Therefore, the PICC line was cut short to 24 cm and left peripherally in the left cephalic vein at the level of the left shoulder. This should be used as peripheral access only. Fluoroscopy during the procedure and fluoro spot radiograph confirms appropriate catheter position. The catheter was flushed and covered with asterile dressing.  Catheter length: 24 cm  Complications: No immediate  IMPRESSION: Successful Right arm power PICC line placement with ultrasound and fluoroscopic guidance. PICC line tip is within the left cephalic vein peripherally as described. The catheter is ready for use.   Electronically Signed   By: Daryll Brod M.D.   On: 12/25/2013 14:14    ASSESSMENT AND PLAN 1. ICD pocket infection 2. S/p Extraction 3. Sinus node dysfunction, s/p temp perm atrial pacing lead insertion 4. H/o VT, he will need life vest which has been discussed with Janan Halter of Life Vest and will be placed.  5. Chronic systolic heart failure, well compensated Rec: ok for discharge to SNF for rehab. He will continue his current meds except would like to discharge on 7 days of Keflex 500 mg tid. He will need followup in our device clinic in 7 days to have sutures removed.  Gregg Taylor,M.D.  12/26/2013 10:35 AM

## 2013-12-26 NOTE — Discharge Summary (Signed)
CARDIOLOGY DISCHARGE SUMMARY   Patient ID: Cody Faulkner MRN: [id number] DOB/AGE: [birth year] 68 y.o.  Admit date: 12/20/2013 Discharge date: 12/26/2013  PCP: Leonides Sake, MD Primary Cardiologist: Dr. Caryl Comes  Primary Discharge Diagnosis:   ICD (implantable cardioverter-defibrillator) infection Secondary Discharge Diagnosis:    Atrial fibrillation   Medtronic ICD   History of dilated cardiomyopathy- last EF 50-55% 2D July 2014   Chronic diastolic heart failure   CAD nonobstructive- 2007   CKD (chronic kidney disease) stage 3, GFR 30-59 ml/min   Sinus node dysfunction   Paroxysmal ventricular tachycardia   Chronic anticoagulation- Coumadin   Consults: Infectious disease, physical therapy, occupational therapy, social work  Procedures: ICD system extraction and insertion of a temporary permanent pacemaker with left upper extremity tomography under general anesthesia, PICC placement  Hospital Course: Cody Faulkner is a 68 y.o. male with a history of CAD, sinus node dysfunction, and ventricular tachycardia. He had been started on oral antibiotics for lower extremity cellulitis. His device pocket was normal at that time. He then developed erythema over the pocket associated with pain and tenderness. He was seen by Dr. Lovena Le and admitted for further evaluation and treatment.  Removal of the ICD system was recommended and this was performed on 07/15. It was done under general anesthesia. He tolerated the procedure well, without immediate complication. He had a temporary permanent pacemaker implanted. He also has a LifeVest. An ICD will be inserted at a later date, once his site heals. This will probably happen and 3-4 weeks.  An infectious disease consult was called to help manage his antibiotic therapy. He was seen by Dr. Megan Salon, who reviewed previous antibiotic therapy and made recommendations. He was started on IV vancomycin and is to complete 14 days of therapy. A PICC line  was inserted to facilitate IV antibiotic therapy.  He was followed closely after the procedure. He was felt to have some weakness and deconditioning, so he was seen by physical therapy and occupational therapy. They continue to work with him during his hospital stay but recommended skilled nursing facility placement. A social work consult was called and placement was arranged. He will go to [facility] at discharge.  On 07/21, he was seen by Dr. Lovena Le and all data were reviewed. His PICC line was in place and arrangements have been completed for SNF placement and IV antibiotic therapy. No further inpatient workup is indicated and he is considered stable for discharge, to follow up as an outpatient.   Labs:  Lab Results  Component Value Date   WBC 4.7 12/25/2013   HGB 11.7* 12/25/2013   HCT 35.9* 12/25/2013   MCV 87.6 12/25/2013   PLT 169 12/25/2013     Recent Labs Lab 12/25/13 1000  NA 138  K 4.2  CL 101  CO2 25  BUN 29*  CREATININE 1.36*  CALCIUM 8.9  GLUCOSE 92   Pro B Natriuretic peptide (BNP)  Date/Time Value Ref Range Status  12/31/2012  9:10 AM 459.6* 0 - 125 pg/mL Final  12/12/2012  4:03 AM 209.3* 0 - 125 pg/mL Final    Recent Labs  12/26/13 0318  INR 1.38      Radiology: Dg Chest 2 View 12/21/2013   CLINICAL DATA:  Pacemaker replacement.  EXAM: CHEST  2 VIEW  COMPARISON:  Two-view chest x-ray 12/19/2013, 07/14/2012.  FINDINGS: Interval removal of the pacing defibrillator with placement of a new single lead atrial pacer via the left subclavian vein. No evidence  of pneumothorax or mediastinal hematoma. Lead tip positioned at the expected location of the right atrial appendage. Cardiac silhouette normal in size, unchanged. Thoracic aorta atherosclerotic and mildly tortuous, unchanged. Hilar and mediastinal contours otherwise unremarkable. Azygos fissure. Lungs clear. Bronchovascular markings normal. Pulmonary vascularity normal. No visible pleural effusions. No  pneumothorax. Degenerative changes throughout the thoracic spine with exaggeration of the usual thoracic kyphosis.  IMPRESSION: 1. New single lead left subclavian pacemaker with the lead tip at the expected location of the right atrial appendage. No acute complicating features. 2.  No acute cardiopulmonary disease.   Electronically Signed   By: Evangeline Dakin M.D.   On: 12/21/2013 08:43   Dg Chest 2 View 12/19/2013   CLINICAL DATA:  Hypertension.  EXAM: CHEST  2 VIEW  COMPARISON:  January 02, 2013.  FINDINGS: Stable cardiomediastinal silhouette. Left-sided pacemaker is unchanged in position. No pneumothorax or pleural effusion is noted. No acute pulmonary disease is noted. Bony thorax is intact.  IMPRESSION: No acute cardiopulmonary abnormality seen.   Electronically Signed   By: Sabino Dick M.D.   On: 12/19/2013 16:31   Ir US Guide Vasc Access Left 12/25/2013   CLINICAL DATA:  Coronary disease, cardiomyopathy, no current access, plan for pacer revision  EXAM: POWER PICC LINE PLACEMENT WITH ULTRASOUND AND FLUOROSCOPIC GUIDANCE  FLUOROSCOPY TIME:  2 min 30 seconds  PROCEDURE: The patient was advised of the possible risks andcomplications and agreed to undergo the procedure. The patient was then brought to the angiographic suite for the procedure.  The right arm was prepped with chlorhexidine, drapedin the usual sterile fashion using maximum barrier technique (cap and mask, sterile gown, sterile gloves, large sterile sheet, hand hygiene and cutaneous antisepsis) and infiltrated locally with 1% Lidocaine.  Ultrasound demonstrated patency of the right cephalic vein, and this was documented with an image. Under real-time ultrasound guidance, this vein was accessed with a 21 gauge micropuncture needle and image documentation was performed. A 0.018 wire was introduced in to the vein. Guidewire would not advance centrally past the existing left subclavian pacer wire compatible with central occlusion. Therefore, the PICC  line was cut short to 24 cm and left peripherally in the left cephalic vein at the level of the left shoulder. This should be used as peripheral access only. Fluoroscopy during the procedure and fluoro spot radiograph confirms appropriate catheter position. The catheter was flushed and covered with asterile dressing.  Catheter length: 24 cm  Complications: No immediate  IMPRESSION: Successful Right arm power PICC line placement with ultrasound and fluoroscopic guidance. PICC line tip is within the left cephalic vein peripherally as described. The catheter is ready for use.   Electronically Signed   By: Daryll Brod M.D.   On: 12/25/2013 14:14      ICD system extraction: 12/20/2013 PROCEDURE: After informed consent was obtained, the patient was taken  to the operating room in the fasting state. Invasive arterial  monitoring was placed trough the right radial artery. Initial attempts  to puncture the left internal jugular vein were unsuccessful.  Venography of the left upper extremity venous system demonstrating that  the subclavian vein was occluded in total with extensive collaterals.  There was no evidence of any left jugular vein present. As such, a 7-cm  incision was carried out over the old ICD insertion site and  electrocautery was utilized to dissect down to the ICD pocket. There  was extensive and very dense fibrous adhesion, and there was clear  fluid, which had  a purulent tinge to it. It was also somewhat brownish  and yellowish in color. The pocket was irrigated and after extensive  electrocautery was utilized to free up the generator, it was removed in  total. The patient's underlying sinus rate was 30-35 beats per minute.  Pacing from the atrial lead, the old atrial lead was carried out.  Electrocautery was then utilized to free up the very dense fibrous  adhesions around all of the pacing and defibrillator leads. The  previously implanted year and a half old active fixation  Medtronic  defibrillator lead [phone]) was first selected for removal. The stylet  was advanced into the lead body and the helix retracted. Gentle  traction was then placed on the lead body and the 6935 Medtronic ICD  lead was removed in total. Attention was then next directed at the 6949  defibrillation lead. A stylet was advanced into the tip of the lead and  attempts to retract the helix were unsuccessful. At this point, the  lead was cut and a Cook liberator locking stylet was advanced into the  defibrillation lead and locked in place. An One-Tie suture was placed  at the proximal portion of the lead where it connected to the locking  stylet. Next, the [facility] 11-French RL Shortie mechanical extraction  sheath was advanced over the lead body and into the subclavian vein.  There was very dense fibrous adhesions present. Next, the [employer]  11-French mechanical extraction sheath was utilized and the  defibrillator lead was freed up after the extraction sheath was able to  envelope the proximal coil. Unfortunately, the atrial lead was attached  to the old defibrillator lead and came out as well into the coil. This  resulted in dilemma as the tip of the atrial lead was freed up and the  back portion of the atrial lead was still attached to the fascia. The  defibrillator lead was disconnected from the atrial lead and the atrial  lead was cut. At this point, a guidewire was advanced through the  extraction sheath into the central circulation to maintain access. The  patient's heart rate was 35 beats per minute and his blood pressure  stable. A [employer] 11-French mini sheath was then inserted over  the remaining atrial lead, which was bound up in the subclavian vein,  and after very gentle traction placed and pressure placed with the  extraction sheath, the remnant of the atrial lead was freed up in total.  At this point, a puncture incision was made superior to the extraction    incision and the guidewire was retracted through the separate incision.  A 7-French active fixation pacing lead was then advanced over an 8-  French sheath over the guidewire and into the central circulation.  Because the patient had intact AV node conduction, the new atrial lead  was actively fixed and advanced out through the skin and so to the  fascia with silk suture. At this point, electrocautery was utilized to  debride out the very dense fibrous scar tissue and necrotic tissue,  electrocautery was utilized to attempt to assure hemostasis, and the  incision was closed with a layer of 2-0 Prolene mattress suture.  Iodoform packing gauze was placed in the pocket. A pressure dressing  was placed and the patient was returned to the recovery area in  satisfactory condition with a pacing rate of 70 beats per minute.  COMPLICATIONS: There were no immediate procedure complications.  RESULTS: This demonstrates successful extraction of a  previously  implanted model [phone] defibrillation lead, a model 6935 defibrillation  lead, and a model 5076 atrial pacing lead all without immediate  procedure complication followed by insertion of a new atrial pacing lead  in a patient with severe sinus node dysfunction, who developed an ICD  pocket infection.   FOLLOW UP PLANS AND APPOINTMENTS Allergies  Allergen Reactions  . Methimazole [Thiamazole] Itching and Rash    Severe rash     Medication List         acetaminophen 500 MG tablet  Commonly known as:  TYLENOL  Take 500 mg by mouth every 6 (six) hours as needed for moderate pain.     albuterol 108 (90 BASE) MCG/ACT inhaler  Commonly known as:  PROVENTIL HFA;VENTOLIN HFA  Inhale 2 puffs into the lungs every 4 (four) hours as needed for shortness of breath.     allopurinol 100 MG tablet  Commonly known as:  ZYLOPRIM  Take 100 mg by mouth daily.     amiodarone 200 MG tablet  Commonly known as:  PACERONE  Take 200 mg by mouth daily.      aspirin 81 MG chewable tablet  Chew 81 mg by mouth daily.     bisoprolol 10 MG tablet  Commonly known as:  ZEBETA  Take 10 mg by mouth daily.     budesonide-formoterol 160-4.5 MCG/ACT inhaler  Commonly known as:  SYMBICORT  Inhale 2 puffs into the lungs 2 (two) times daily.     docusate sodium 100 MG capsule  Commonly known as:  COLACE  Take 100 mg by mouth daily as needed for mild constipation.     fenofibrate 160 MG tablet  Take 160 mg by mouth daily.     furosemide 40 MG tablet  Commonly known as:  LASIX  Take 40-80 mg by mouth See admin instructions. Take 2 tablets by mouth Monday, Wednesday, and Friday.  Take 1 tablet by mouth all other days     isosorbide mononitrate 60 MG 24 hr tablet  Commonly known as:  IMDUR  Take 60 mg by mouth daily.     levothyroxine 100 MCG tablet  Commonly known as:  SYNTHROID, LEVOTHROID  Take 1 tablet (100 mcg total) by mouth daily.     loratadine 10 MG tablet  Commonly known as:  CLARITIN  Take 10 mg by mouth every morning.     potassium chloride SA 20 MEQ tablet  Commonly known as:  K-DUR,KLOR-CON  Take 20 mEq by mouth every other day.     vancomycin 1,750 mg in sodium chloride 0.9 % 500 mL  Inject 1,750 mg into the vein daily.     Vitamin D 2000 UNITS tablet  Take 2,000 Units by mouth daily.     warfarin 5 MG tablet  Commonly known as:  COUMADIN  Take 5-7.5 mg by mouth See admin instructions. Take 7.5mg  by mouth onMonday, Wednesday, and Friday.  Take 5mg  tablet Tuesday, Thursday, Saturday, Sunday         Follow-up Information   Follow up with [facility]) On 01/01/2014. (Wound check and device check at 3:30 pm)    Specialty:  Cardiology   Contact information:   [address] [place] [address] [phone]      BRING ALL MEDICATIONS WITH YOU TO FOLLOW UP APPOINTMENTS  Time spent with patient to include physician time: 41 min Signed: Rosaria Ferries, PA-C 12/26/2013, 11:08  AM Co-Sign MD

## 2013-12-26 NOTE — Progress Notes (Signed)
Report called to [facility] at [facility], Karren Burly, RN

## 2013-12-26 NOTE — Progress Notes (Signed)
Pt and family made aware of pending transfer, pt assisted to put on lifevest and activate it, pt helped to get dressed, IV and tele removed Rickard Rhymes, RN

## 2014-01-01 ENCOUNTER — Ambulatory Visit (INDEPENDENT_AMBULATORY_CARE_PROVIDER_SITE_OTHER): Payer: Medicare Other | Admitting: *Deleted

## 2014-01-01 DIAGNOSIS — I495 Sick sinus syndrome: Secondary | ICD-10-CM

## 2014-01-01 DIAGNOSIS — I480 Paroxysmal atrial fibrillation: Secondary | ICD-10-CM

## 2014-01-01 DIAGNOSIS — I4891 Unspecified atrial fibrillation: Secondary | ICD-10-CM

## 2014-01-01 LAB — MDC_IDC_ENUM_SESS_TYPE_INCLINIC
Battery Impedance: 296 Ohm
Battery Remaining Longevity: 8.5
Battery Voltage: 2.79 V
Brady Statistic RA Percent Paced: 99.9 %
Lead Channel Pacing Threshold Amplitude: 0.5 V
Lead Channel Pacing Threshold Pulse Width: 0.4 ms
MDC IDC MSMT LEADCHNL RA IMPEDANCE VALUE: 460 Ohm
MDC IDC MSMT LEADCHNL RA SENSING INTR AMPL: 2.8 mV
MDC IDC SET LEADCHNL RA PACING AMPLITUDE: 2.5 V

## 2014-01-01 NOTE — Progress Notes (Signed)
Wound check appointment for temp perm device. Tegaderm removed and reapplied with clean 4x4s. Sutures removed x 4. Wound without redness or edema. Incision edges approximated. Normal device function. Threshold, sensing, and impedances consistent with implant measurements. Device programmed at appropriate safety margins. Histogram distribution appropriate for patient and level of activity. No atrial high rate episodes. Patient educated about wound care, arm mobility, lifting restrictions. ROV in 2 weeks with [facility] to discuss new implant of ICD.

## 2014-01-04 ENCOUNTER — Ambulatory Visit (INDEPENDENT_AMBULATORY_CARE_PROVIDER_SITE_OTHER): Payer: Medicare Other | Admitting: *Deleted

## 2014-01-04 DIAGNOSIS — I480 Paroxysmal atrial fibrillation: Secondary | ICD-10-CM

## 2014-01-04 DIAGNOSIS — I472 Ventricular tachycardia, unspecified: Secondary | ICD-10-CM

## 2014-01-04 DIAGNOSIS — I428 Other cardiomyopathies: Secondary | ICD-10-CM

## 2014-01-04 DIAGNOSIS — I4729 Other ventricular tachycardia: Secondary | ICD-10-CM

## 2014-01-04 DIAGNOSIS — I495 Sick sinus syndrome: Secondary | ICD-10-CM

## 2014-01-04 DIAGNOSIS — I4891 Unspecified atrial fibrillation: Secondary | ICD-10-CM

## 2014-01-05 NOTE — Progress Notes (Signed)
Patient presents to the ofc with c/o bleeding coming from his temp perm last night. No active bleeding present during today's wound check. Wound appears to be healing normally. Site was flushed with saline. 4x4s and tegaderm reapplied. Janan Halter, RN also applied new vest(Life Vest) due to the soiling. Patient will follow up with [facility] early next month to discuss ? Reimplantation of ICD.

## 2014-01-18 ENCOUNTER — Ambulatory Visit (INDEPENDENT_AMBULATORY_CARE_PROVIDER_SITE_OTHER): Payer: Medicare Other | Admitting: Internal Medicine

## 2014-01-18 ENCOUNTER — Encounter: Payer: Self-pay | Admitting: Internal Medicine

## 2014-01-18 VITALS — BP 132/84 | HR 70 | Ht 66.0 in | Wt 249.4 lb

## 2014-01-18 DIAGNOSIS — Z5189 Encounter for other specified aftercare: Secondary | ICD-10-CM

## 2014-01-18 DIAGNOSIS — I495 Sick sinus syndrome: Secondary | ICD-10-CM

## 2014-01-18 DIAGNOSIS — I4729 Other ventricular tachycardia: Secondary | ICD-10-CM

## 2014-01-18 DIAGNOSIS — I472 Ventricular tachycardia: Secondary | ICD-10-CM

## 2014-01-18 DIAGNOSIS — T827XXD Infection and inflammatory reaction due to other cardiac and vascular devices, implants and grafts, subsequent encounter: Secondary | ICD-10-CM

## 2014-01-18 LAB — CBC WITH DIFFERENTIAL/PLATELET
Basophils Absolute: 0 10*3/uL (ref 0.0–0.1)
Basophils Relative: 0.5 % (ref 0.0–3.0)
EOS PCT: 1.6 % (ref 0.0–5.0)
Eosinophils Absolute: 0.1 10*3/uL (ref 0.0–0.7)
HCT: 38.1 % — ABNORMAL LOW (ref 39.0–52.0)
Hemoglobin: 12.8 g/dL — ABNORMAL LOW (ref 13.0–17.0)
Lymphocytes Relative: 22.4 % (ref 12.0–46.0)
Lymphs Abs: 1.5 10*3/uL (ref 0.7–4.0)
MCHC: 33.5 g/dL (ref 30.0–36.0)
MCV: 84.8 fl (ref 78.0–100.0)
MONOS PCT: 9.5 % (ref 3.0–12.0)
Monocytes Absolute: 0.6 10*3/uL (ref 0.1–1.0)
NEUTROS PCT: 66 % (ref 43.0–77.0)
Neutro Abs: 4.4 10*3/uL (ref 1.4–7.7)
Platelets: 191 10*3/uL (ref 150.0–400.0)
RBC: 4.49 Mil/uL (ref 4.22–5.81)
RDW: 15.7 % — ABNORMAL HIGH (ref 11.5–15.5)
WBC: 6.7 10*3/uL (ref 4.0–10.5)

## 2014-01-18 LAB — BASIC METABOLIC PANEL
BUN: 18 mg/dL (ref 6–23)
CHLORIDE: 101 meq/L (ref 96–112)
CO2: 25 meq/L (ref 19–32)
Calcium: 8.8 mg/dL (ref 8.4–10.5)
Creatinine, Ser: 2.1 mg/dL — ABNORMAL HIGH (ref 0.4–1.5)
GFR: 33.37 mL/min — ABNORMAL LOW (ref >60.00)
Glucose, Bld: 87 mg/dL (ref 70–99)
POTASSIUM: 3.8 meq/L (ref 3.5–5.1)
SODIUM: 135 meq/L (ref 135–145)

## 2014-01-18 LAB — PROTIME-INR
INR: 5.7 ratio — AB (ref 0.8–1.0)
PROTHROMBIN TIME: 59.9 s — AB (ref 9.6–13.1)

## 2014-01-18 NOTE — Patient Instructions (Addendum)
Your physician recommends that you continue on your current medications as directed. Please refer to the Current Medication list given to you today.  Lab Today Bmet, Cbc, Pt/Inr  Your physician has recommended that you have a defibrillator inserted. An implantable cardioverter defibrillator (ICD) is a small device that is placed in your chest or, in rare cases, your abdomen. This device uses electrical pulses or shocks to help control life-threatening, irregular heartbeats that could lead the heart to suddenly stop beating (sudden cardiac arrest). Leads are attached to the ICD that goes into your heart. This is done in the hospital and usually requires an overnight stay. Please see the instruction sheet given to you today for more information.

## 2014-01-18 NOTE — Assessment & Plan Note (Signed)
He is s/p extraction with a temp perm device. Will plan to proceed with insertion of a new ICD in a few days. Will plan to remove his temp perm PM at that time.

## 2014-01-18 NOTE — Assessment & Plan Note (Signed)
His life vest has not fired. He has had no recurrent ventricular arrhythmias.

## 2014-01-18 NOTE — Progress Notes (Signed)
HPI [name] returns today for followup. He is a pleasant 68 yo man with an ICM, VT, sinus node dysfunction, s/p ICD extraction after developing a pocket infection. He has done well in the interim. He denies fever or chills. No syncope. His temp.-perm. PM is working normally. He is anxious to get rid of his life vest and temp PM. Allergies  Allergen Reactions  . Methimazole [Thiamazole] Itching and Rash    Severe rash     Current Outpatient Prescriptions  Medication Sig Dispense Refill  . acetaminophen (TYLENOL) 500 MG tablet Take 500 mg by mouth every 6 (six) hours as needed for moderate pain.       Marland Kitchen albuterol (PROVENTIL HFA;VENTOLIN HFA) 108 (90 BASE) MCG/ACT inhaler Inhale 2 puffs into the lungs every 4 (four) hours as needed for shortness of breath.       . allopurinol (ZYLOPRIM) 100 MG tablet Take 100 mg by mouth daily.       Marland Kitchen amiodarone (PACERONE) 200 MG tablet Take 200 mg by mouth daily.       Marland Kitchen aspirin 81 MG chewable tablet Chew 81 mg by mouth daily.      . bisoprolol (ZEBETA) 10 MG tablet Take 10 mg by mouth daily.      . budesonide-formoterol (SYMBICORT) 160-4.5 MCG/ACT inhaler Inhale 2 puffs into the lungs 2 (two) times daily.      . Cholecalciferol (VITAMIN D) 2000 UNITS tablet Take 2,000 Units by mouth daily.      Marland Kitchen docusate sodium (COLACE) 100 MG capsule Take 100 mg by mouth daily as needed for mild constipation.      . fenofibrate 160 MG tablet Take 160 mg by mouth daily.      . furosemide (LASIX) 40 MG tablet Take 40-80 mg by mouth See admin instructions. Take 2 tablets by mouth Monday, Wednesday, and Friday.  Take 1 tablet by mouth all other days      . isosorbide mononitrate (IMDUR) 60 MG 24 hr tablet Take 60 mg by mouth daily.      Marland Kitchen levothyroxine (SYNTHROID, LEVOTHROID) 100 MCG tablet Take 1 tablet (100 mcg total) by mouth daily.  30 tablet  4  . loratadine (CLARITIN) 10 MG tablet Take 10 mg by mouth every morning.       . nystatin (MYCOSTATIN) powder Apply  topically as needed.      . potassium chloride SA (K-DUR,KLOR-CON) 20 MEQ tablet Take 20 mEq by mouth every other day.      . Warfarin Sodium (COUMADIN PO) Take 1 tablet daily (7 mg)       No current facility-administered medications for this visit.     Past Medical History  Diagnosis Date  . Atrial fibrillation     takes COumadin daily  . Depression   . CAD (coronary artery disease)     predominantly single vessel  . Ventricular tachycardia     s/p defibrillator-Medtronic EnTrust [id number]  . Dilated cardiomyopathy     s/p defibrillator Medtronic EnTrust [phone]  . Solitary kidney 06/05/2006  . Gout     takes Allopurinol daily  . History of hyperthyroidism   . Left knee DJD     needs surgery  . Asthma     Albuterol prn;Symbicort daily  . Constipation     takes Colace daily as needed  . Hyperlipidemia     takes Fenofibrate daily  . Seasonal allergies     takes Claritin daily  . Hypertension  takes Bisoprolol daily as well as Imdur  . Myocardial infarction 2007  . Peripheral edema     takes Furosemide daily  . Urinary incontinence   . Shortness of breath     with exertion  . Dizziness     occasionally  . Joint pain   . Joint swelling   . Bruises easily     d/t being on Coumadin  . GERD (gastroesophageal reflux disease)     takes OTC med if needed  . Urinary frequency   . Urinary urgency   . Cataracts, bilateral to be removed in Aug 2015  . Pacemaker   . Pneumonia, community acquired last time 2014    "get it ~ q yr; haven't had it yet in 2015" (12/20/2013)  . OSA (obstructive sleep apnea)     "don't wear mask; can't afford one" (12/20/2013)  . Hypothyroidism     takes Synthroid daily  . Chronic kidney disease 1976    S/P nephrectomy    ROS:   All systems reviewed and negative except as noted in the HPI.   Past Surgical History  Procedure Laterality Date  . Cardiac defibrillator placement  2007    Medtronic EnTrust [phone]  . Nephrectomy  1976    Right  .  Cardiac catheterization  2007    60% Stenosis mid-CFX  . Knee arthroscopy Left   . Cardioversion  06/18/2011    Procedure: CARDIOVERSION;  Surgeon: Kerry Hough., MD;  Location: [facility];  Service: Cardiovascular;  Laterality: N/A;  . Tee without cardioversion  07/13/2012    Procedure: TRANSESOPHAGEAL ECHOCARDIOGRAM (TEE);  Surgeon: Lelon Perla, MD;  Location: [facility] ENDOSCOPY;  Service: Cardiovascular;  Laterality: N/A;  . Cardioversion N/A 12/06/2012    Procedure: CARDIOVERSION;  Surgeon: Jacolyn Reedy, MD;  Location: [facility] ENDOSCOPY;  Service: Cardiovascular;  Laterality: N/A;  . Colonoscopy    . Multiple tooth extractions      "top's are all gone; took some off the bottom too"  . Icd generator change  07/2012  . Cardiac defibrillator removal  12/20/2013  . Insert / replace / remove pacemaker  12/20/2013  . Icd lead removal Left 12/20/2013    Procedure: ICD LEAD REMOVAL//EXTRACTION AND PLACEMENT OF TEMP/PERM ATRIAL LEAD;  Surgeon: Evans Lance, MD;  Location: [facility] OR;  Service: Cardiovascular;  Laterality: Left;     Family History  Problem Relation Age of Onset  . Heart disease Other     uncle  . Diabetes Other     uncle     History   Social History  . Marital Status: Single    Spouse Name: N/A    Number of Children: N/A  . Years of Education: N/A   Occupational History  . disabled    Social History Main Topics  . Smoking status: Never Smoker   . Smokeless tobacco: Never Used  . Alcohol Use: Yes     Comment: 12/20/2013 "might have drank a couple beers before; never really drank"  . Drug Use: No  . Sexual Activity: No   Other Topics Concern  . Not on file   Social History Narrative   Never married.  Worked previously in [employer].     BP 132/84  Pulse 70  Ht 5\' 6"  (1.676 m)  Wt 249 lb 6.4 oz (113.127 kg)  BMI 40.27 kg/m2  Physical Exam:  stable appearing 68 yo man, NAD HEENT: Unremarkable Neck:  No JVD, no thyromegally Back:  No CVA  tenderness Lungs:  Clear with no wheezes HEART:  Regular rate rhythm, no murmurs, no rubs, no clicks Abd:  soft, positive bowel sounds, no organomegally, no rebound, no guarding Ext:  2 plus pulses, no edema, no cyanosis, no clubbing Skin:  No rashes no nodules Neuro:  CN II through XII intact, motor grossly intact  EKG - atrial pacing   Assess/Plan:

## 2014-01-18 NOTE — Assessment & Plan Note (Signed)
He will require an atrial lead with his next device.

## 2014-01-19 ENCOUNTER — Encounter: Payer: Self-pay | Admitting: Internal Medicine

## 2014-01-19 ENCOUNTER — Telehealth: Payer: Self-pay | Admitting: *Deleted

## 2014-01-19 DIAGNOSIS — T827XXD Infection and inflammatory reaction due to other cardiac and vascular devices, implants and grafts, subsequent encounter: Secondary | ICD-10-CM

## 2014-01-19 NOTE — Telephone Encounter (Signed)
Dr. Lovena Le aware of critical labs, and V.O. Per Dr Lovena Le for the pt to discontinue Coumadin. Contacted pts SNF [facility] to inform them of pts critical labs and Dr Tanna Furry recommendation for the pt to stop Coumadin.  Per SNF nurse, they have already initiated this per there Doctor at the SNF.  Per pts nurse their doctor ordered for pts Coumadin to be discontinued TH, F, Sat, and Sunday, and recheck PT/INR on Monday.  Asked the facility nurse once they obtain the new pt/inr, please fax this information to our office at [phone] [facility] and Dr Lovena Le.  Facility nurse verbalized understanding of instruction given. Will route this message to Dr Lovena Le and nurse for further review.

## 2014-01-22 ENCOUNTER — Encounter: Payer: Self-pay | Admitting: Internal Medicine

## 2014-01-22 ENCOUNTER — Encounter: Payer: Medicare Other | Admitting: Internal Medicine

## 2014-01-22 LAB — PROTIME-INR: INR: 1.5 — AB (ref 0.9–1.1)

## 2014-01-23 ENCOUNTER — Telehealth: Payer: Self-pay | Admitting: Internal Medicine

## 2014-01-23 NOTE — Telephone Encounter (Signed)
Spoke with Threasa Beards at [facility] care. She wanted to make sure if pt needs to hold the coumadin the day of his ICD implant scheduled for 01/25/14. Pt had an INR checked yesterday 8/17 INR was 1.5 coumadin held, and will restart today.  Dr. Lovena Le states that pt is to re-start the coumadin today, and he is to  take coumadin the day of the ICD procedure. Threasa Beards is aware.

## 2014-01-23 NOTE — Telephone Encounter (Signed)
New message         Pt is suppose to have surgery on Thursday / Threasa Beards has question about pt coumadin

## 2014-01-24 DIAGNOSIS — F329 Major depressive disorder, single episode, unspecified: Secondary | ICD-10-CM | POA: Diagnosis not present

## 2014-01-24 DIAGNOSIS — Z905 Acquired absence of kidney: Secondary | ICD-10-CM | POA: Diagnosis not present

## 2014-01-24 DIAGNOSIS — I472 Ventricular tachycardia: Secondary | ICD-10-CM | POA: Diagnosis not present

## 2014-01-24 DIAGNOSIS — G4733 Obstructive sleep apnea (adult) (pediatric): Secondary | ICD-10-CM | POA: Diagnosis not present

## 2014-01-24 DIAGNOSIS — I2510 Atherosclerotic heart disease of native coronary artery without angina pectoris: Secondary | ICD-10-CM | POA: Diagnosis not present

## 2014-01-24 DIAGNOSIS — E039 Hypothyroidism, unspecified: Secondary | ICD-10-CM | POA: Diagnosis not present

## 2014-01-24 DIAGNOSIS — E669 Obesity, unspecified: Secondary | ICD-10-CM | POA: Diagnosis not present

## 2014-01-24 DIAGNOSIS — M1710 Unilateral primary osteoarthritis, unspecified knee: Secondary | ICD-10-CM | POA: Diagnosis not present

## 2014-01-24 DIAGNOSIS — I2589 Other forms of chronic ischemic heart disease: Secondary | ICD-10-CM | POA: Diagnosis not present

## 2014-01-24 DIAGNOSIS — I4729 Other ventricular tachycardia: Secondary | ICD-10-CM | POA: Diagnosis not present

## 2014-01-24 DIAGNOSIS — K5900 Constipation, unspecified: Secondary | ICD-10-CM | POA: Diagnosis not present

## 2014-01-24 DIAGNOSIS — F3289 Other specified depressive episodes: Secondary | ICD-10-CM | POA: Diagnosis not present

## 2014-01-24 DIAGNOSIS — I4891 Unspecified atrial fibrillation: Secondary | ICD-10-CM | POA: Diagnosis not present

## 2014-01-24 DIAGNOSIS — I252 Old myocardial infarction: Secondary | ICD-10-CM | POA: Diagnosis not present

## 2014-01-24 DIAGNOSIS — Z7901 Long term (current) use of anticoagulants: Secondary | ICD-10-CM | POA: Diagnosis not present

## 2014-01-24 DIAGNOSIS — E785 Hyperlipidemia, unspecified: Secondary | ICD-10-CM | POA: Diagnosis not present

## 2014-01-24 DIAGNOSIS — M109 Gout, unspecified: Secondary | ICD-10-CM | POA: Diagnosis not present

## 2014-01-24 DIAGNOSIS — I129 Hypertensive chronic kidney disease with stage 1 through stage 4 chronic kidney disease, or unspecified chronic kidney disease: Secondary | ICD-10-CM | POA: Diagnosis not present

## 2014-01-24 DIAGNOSIS — N189 Chronic kidney disease, unspecified: Secondary | ICD-10-CM | POA: Diagnosis not present

## 2014-01-24 DIAGNOSIS — I495 Sick sinus syndrome: Secondary | ICD-10-CM | POA: Diagnosis not present

## 2014-01-24 DIAGNOSIS — Z6841 Body Mass Index (BMI) 40.0 and over, adult: Secondary | ICD-10-CM | POA: Diagnosis not present

## 2014-01-24 DIAGNOSIS — R0602 Shortness of breath: Secondary | ICD-10-CM | POA: Diagnosis not present

## 2014-01-24 DIAGNOSIS — H269 Unspecified cataract: Secondary | ICD-10-CM | POA: Diagnosis not present

## 2014-01-24 DIAGNOSIS — I509 Heart failure, unspecified: Secondary | ICD-10-CM | POA: Diagnosis not present

## 2014-01-24 DIAGNOSIS — K219 Gastro-esophageal reflux disease without esophagitis: Secondary | ICD-10-CM | POA: Diagnosis not present

## 2014-01-24 DIAGNOSIS — R609 Edema, unspecified: Secondary | ICD-10-CM | POA: Diagnosis not present

## 2014-01-24 MED ORDER — CHLORHEXIDINE GLUCONATE 4 % EX LIQD
60.0000 mL | Freq: Once | CUTANEOUS | Status: DC
  Filled 2014-01-24: qty 60

## 2014-01-24 MED ORDER — SODIUM CHLORIDE 0.9 % IV SOLN
INTRAVENOUS | Status: DC
  Administered 2014-01-25: 14:00:00 via INTRAVENOUS

## 2014-01-24 MED ORDER — SODIUM CHLORIDE 0.9 % IR SOLN
80.0000 mg | Status: DC
  Filled 2014-01-24: qty 2

## 2014-01-24 MED ORDER — CEFAZOLIN SODIUM-DEXTROSE 2-3 GM-% IV SOLR: 2.0000 g | INTRAVENOUS | Status: DC

## 2014-01-25 ENCOUNTER — Encounter (HOSPITAL_COMMUNITY)
Admission: RE | Disposition: A | Discharge status: Home / Self Care | Payer: Medicare Other | Source: Ambulatory Visit | Attending: Internal Medicine

## 2014-01-25 ENCOUNTER — Encounter (HOSPITAL_COMMUNITY): Payer: Self-pay | Admitting: Pharmacy Technician

## 2014-01-25 ENCOUNTER — Ambulatory Visit (HOSPITAL_COMMUNITY)
Admission: RE | Admit: 2014-01-25 | Discharge: 2014-01-26 | Disposition: A | Discharge status: Home / Self Care | Payer: Medicare Other | Source: Ambulatory Visit | Attending: Internal Medicine | Admitting: Internal Medicine

## 2014-01-25 ENCOUNTER — Encounter (HOSPITAL_COMMUNITY): Payer: Self-pay | Admitting: General Practice

## 2014-01-25 DIAGNOSIS — I129 Hypertensive chronic kidney disease with stage 1 through stage 4 chronic kidney disease, or unspecified chronic kidney disease: Secondary | ICD-10-CM | POA: Insufficient documentation

## 2014-01-25 DIAGNOSIS — I2510 Atherosclerotic heart disease of native coronary artery without angina pectoris: Secondary | ICD-10-CM | POA: Insufficient documentation

## 2014-01-25 DIAGNOSIS — H269 Unspecified cataract: Secondary | ICD-10-CM | POA: Insufficient documentation

## 2014-01-25 DIAGNOSIS — Z6841 Body Mass Index (BMI) 40.0 and over, adult: Secondary | ICD-10-CM | POA: Insufficient documentation

## 2014-01-25 DIAGNOSIS — M1710 Unilateral primary osteoarthritis, unspecified knee: Secondary | ICD-10-CM | POA: Insufficient documentation

## 2014-01-25 DIAGNOSIS — Z905 Acquired absence of kidney: Secondary | ICD-10-CM | POA: Insufficient documentation

## 2014-01-25 DIAGNOSIS — E039 Hypothyroidism, unspecified: Secondary | ICD-10-CM | POA: Insufficient documentation

## 2014-01-25 DIAGNOSIS — I472 Ventricular tachycardia, unspecified: Secondary | ICD-10-CM | POA: Insufficient documentation

## 2014-01-25 DIAGNOSIS — I2589 Other forms of chronic ischemic heart disease: Secondary | ICD-10-CM | POA: Insufficient documentation

## 2014-01-25 DIAGNOSIS — N189 Chronic kidney disease, unspecified: Secondary | ICD-10-CM | POA: Insufficient documentation

## 2014-01-25 DIAGNOSIS — E669 Obesity, unspecified: Secondary | ICD-10-CM | POA: Insufficient documentation

## 2014-01-25 DIAGNOSIS — M109 Gout, unspecified: Secondary | ICD-10-CM | POA: Insufficient documentation

## 2014-01-25 DIAGNOSIS — I420 Dilated cardiomyopathy: Secondary | ICD-10-CM

## 2014-01-25 DIAGNOSIS — I4729 Other ventricular tachycardia: Secondary | ICD-10-CM

## 2014-01-25 DIAGNOSIS — Z7901 Long term (current) use of anticoagulants: Secondary | ICD-10-CM | POA: Insufficient documentation

## 2014-01-25 DIAGNOSIS — I4891 Unspecified atrial fibrillation: Secondary | ICD-10-CM | POA: Insufficient documentation

## 2014-01-25 DIAGNOSIS — I252 Old myocardial infarction: Secondary | ICD-10-CM | POA: Insufficient documentation

## 2014-01-25 DIAGNOSIS — G4733 Obstructive sleep apnea (adult) (pediatric): Secondary | ICD-10-CM | POA: Insufficient documentation

## 2014-01-25 DIAGNOSIS — I509 Heart failure, unspecified: Secondary | ICD-10-CM | POA: Diagnosis not present

## 2014-01-25 DIAGNOSIS — R609 Edema, unspecified: Secondary | ICD-10-CM | POA: Insufficient documentation

## 2014-01-25 DIAGNOSIS — F329 Major depressive disorder, single episode, unspecified: Secondary | ICD-10-CM | POA: Insufficient documentation

## 2014-01-25 DIAGNOSIS — K219 Gastro-esophageal reflux disease without esophagitis: Secondary | ICD-10-CM | POA: Insufficient documentation

## 2014-01-25 DIAGNOSIS — F3289 Other specified depressive episodes: Secondary | ICD-10-CM | POA: Insufficient documentation

## 2014-01-25 DIAGNOSIS — R0602 Shortness of breath: Secondary | ICD-10-CM | POA: Insufficient documentation

## 2014-01-25 DIAGNOSIS — K5900 Constipation, unspecified: Secondary | ICD-10-CM | POA: Insufficient documentation

## 2014-01-25 DIAGNOSIS — E785 Hyperlipidemia, unspecified: Secondary | ICD-10-CM | POA: Insufficient documentation

## 2014-01-25 DIAGNOSIS — I495 Sick sinus syndrome: Secondary | ICD-10-CM | POA: Insufficient documentation

## 2014-01-25 HISTORY — PX: IMPLANTABLE CARDIOVERTER DEFIBRILLATOR IMPLANT: SHX5473

## 2014-01-25 HISTORY — DX: Heart failure, unspecified: I50.9

## 2014-01-25 HISTORY — DX: Ischemic cardiomyopathy: I25.5

## 2014-01-25 LAB — BASIC METABOLIC PANEL
Anion gap: 11 (ref 5–15)
BUN: 18 mg/dL (ref 6–23)
CO2: 25 mEq/L (ref 19–32)
Calcium: 9.4 mg/dL (ref 8.4–10.5)
Chloride: 105 mEq/L (ref 96–112)
Creatinine, Ser: 1.74 mg/dL — ABNORMAL HIGH (ref 0.50–1.35)
GFR calc Af Amer: 44 mL/min — ABNORMAL LOW (ref >90)
GFR calc non Af Amer: 38 mL/min — ABNORMAL LOW (ref >90)
Glucose, Bld: 90 mg/dL (ref 70–99)
Potassium: 4.5 mEq/L (ref 3.7–5.3)
Sodium: 141 mEq/L (ref 137–147)

## 2014-01-25 LAB — SURGICAL PCR SCREEN
MRSA, PCR: NEGATIVE
Staphylococcus aureus: NEGATIVE

## 2014-01-25 LAB — PROTIME-INR
INR: 1.6 — ABNORMAL HIGH (ref 0.00–1.49)
Prothrombin Time: 19.1 seconds — ABNORMAL HIGH (ref 11.6–15.2)

## 2014-01-25 SURGERY — IMPLANTABLE CARDIOVERTER DEFIBRILLATOR IMPLANT
Anesthesia: LOCAL

## 2014-01-25 MED ORDER — WARFARIN SODIUM 1 MG PO TABS
1.0000 mg | ORAL_TABLET | Freq: Once | ORAL | Status: AC
  Administered 2014-01-25: 1 mg via ORAL
  Filled 2014-01-25: qty 1

## 2014-01-25 MED ORDER — WARFARIN - PHYSICIAN DOSING INPATIENT: Freq: Every day | Status: DC

## 2014-01-25 MED ORDER — LEVOTHYROXINE SODIUM 100 MCG PO TABS
100.0000 ug | ORAL_TABLET | Freq: Every day | ORAL | Status: DC
  Administered 2014-01-26: 100 ug via ORAL
  Filled 2014-01-25 (×2): qty 1

## 2014-01-25 MED ORDER — MIDAZOLAM HCL 5 MG/5ML IJ SOLN
INTRAMUSCULAR | Status: AC
  Filled 2014-01-25: qty 5

## 2014-01-25 MED ORDER — AMIODARONE HCL 200 MG PO TABS
200.0000 mg | ORAL_TABLET | Freq: Every day | ORAL | Status: DC
  Administered 2014-01-26: 200 mg via ORAL
  Filled 2014-01-25: qty 1

## 2014-01-25 MED ORDER — LORATADINE 10 MG PO TABS
10.0000 mg | ORAL_TABLET | Freq: Every morning | ORAL | Status: DC
  Administered 2014-01-26: 10 mg via ORAL
  Filled 2014-01-25: qty 1

## 2014-01-25 MED ORDER — MUPIROCIN 2 % EX OINT
TOPICAL_OINTMENT | Freq: Two times a day (BID) | CUTANEOUS | Status: DC
  Administered 2014-01-25: 1 via NASAL
  Filled 2014-01-25: qty 22

## 2014-01-25 MED ORDER — DOCUSATE SODIUM 100 MG PO CAPS
100.0000 mg | ORAL_CAPSULE | Freq: Every day | ORAL | Status: DC | PRN
  Filled 2014-01-25: qty 1

## 2014-01-25 MED ORDER — FUROSEMIDE 40 MG PO TABS: 40.0000 mg | ORAL_TABLET | ORAL | Status: DC

## 2014-01-25 MED ORDER — ALBUTEROL SULFATE (2.5 MG/3ML) 0.083% IN NEBU: 3.0000 mL | INHALATION_SOLUTION | RESPIRATORY_TRACT | Status: DC | PRN

## 2014-01-25 MED ORDER — CEFAZOLIN SODIUM-DEXTROSE 2-3 GM-% IV SOLR
INTRAVENOUS | Status: AC
Start: 2014-01-25 — End: 2014-01-26
  Filled 2014-01-25: qty 50

## 2014-01-25 MED ORDER — ONDANSETRON HCL 4 MG/2ML IJ SOLN: 4.0000 mg | Freq: Four times a day (QID) | INTRAMUSCULAR | Status: DC | PRN

## 2014-01-25 MED ORDER — LIDOCAINE HCL (PF) 1 % IJ SOLN
INTRAMUSCULAR | Status: AC
Start: 2014-01-25 — End: 2014-01-25
  Filled 2014-01-25: qty 60

## 2014-01-25 MED ORDER — FENTANYL CITRATE 0.05 MG/ML IJ SOLN
INTRAMUSCULAR | Status: AC
  Filled 2014-01-25: qty 2

## 2014-01-25 MED ORDER — CEFAZOLIN SODIUM-DEXTROSE 2-3 GM-% IV SOLR
2.0000 g | Freq: Four times a day (QID) | INTRAVENOUS | Status: AC
  Administered 2014-01-25 – 2014-01-26 (×3): 2 g via INTRAVENOUS
  Filled 2014-01-25 (×3): qty 50

## 2014-01-25 MED ORDER — ACETAMINOPHEN 500 MG PO TABS: 500.0000 mg | ORAL_TABLET | Freq: Four times a day (QID) | ORAL | Status: DC | PRN

## 2014-01-25 MED ORDER — HEPARIN (PORCINE) IN NACL 2-0.9 UNIT/ML-% IJ SOLN
INTRAMUSCULAR | Status: AC
  Filled 2014-01-25: qty 500

## 2014-01-25 MED ORDER — BISOPROLOL FUMARATE 10 MG PO TABS
10.0000 mg | ORAL_TABLET | Freq: Every day | ORAL | Status: DC
  Administered 2014-01-26: 10 mg via ORAL
  Filled 2014-01-25 (×2): qty 1

## 2014-01-25 MED ORDER — ALLOPURINOL 100 MG PO TABS
100.0000 mg | ORAL_TABLET | Freq: Every day | ORAL | Status: DC
  Administered 2014-01-26: 100 mg via ORAL
  Filled 2014-01-25: qty 1

## 2014-01-25 MED ORDER — FUROSEMIDE 80 MG PO TABS
80.0000 mg | ORAL_TABLET | ORAL | Status: DC
  Administered 2014-01-26: 80 mg via ORAL
  Filled 2014-01-25: qty 1

## 2014-01-25 MED ORDER — FENOFIBRATE 160 MG PO TABS
160.0000 mg | ORAL_TABLET | Freq: Every day | ORAL | Status: DC
  Administered 2014-01-26: 160 mg via ORAL
  Filled 2014-01-25 (×2): qty 1

## 2014-01-25 MED ORDER — ISOSORBIDE MONONITRATE ER 60 MG PO TB24
60.0000 mg | ORAL_TABLET | Freq: Every day | ORAL | Status: DC
  Administered 2014-01-26: 60 mg via ORAL
  Filled 2014-01-25: qty 1

## 2014-01-25 MED ORDER — ASPIRIN 81 MG PO CHEW
81.0000 mg | CHEWABLE_TABLET | Freq: Every day | ORAL | Status: DC
  Administered 2014-01-26: 81 mg via ORAL
  Filled 2014-01-25: qty 1

## 2014-01-25 MED ORDER — BUDESONIDE-FORMOTEROL FUMARATE 160-4.5 MCG/ACT IN AERO
2.0000 | INHALATION_SPRAY | Freq: Two times a day (BID) | RESPIRATORY_TRACT | Status: DC
  Administered 2014-01-25 – 2014-01-26 (×2): 2 via RESPIRATORY_TRACT
  Filled 2014-01-25: qty 6

## 2014-01-25 MED ORDER — MUPIROCIN 2 % EX OINT
TOPICAL_OINTMENT | CUTANEOUS | Status: AC
  Administered 2014-01-25: 1 via NASAL
  Filled 2014-01-25: qty 22

## 2014-01-25 MED ORDER — POTASSIUM CHLORIDE CRYS ER 20 MEQ PO TBCR: 20.0000 meq | EXTENDED_RELEASE_TABLET | ORAL | Status: DC

## 2014-01-25 MED ORDER — ACETAMINOPHEN 325 MG PO TABS
325.0000 mg | ORAL_TABLET | ORAL | Status: DC | PRN
Start: 2014-01-25 — End: 2014-01-26

## 2014-01-25 NOTE — H&P (View-Only) (Signed)
HPI [name] returns today for followup. He is a pleasant 68 yo man with an ICM, VT, sinus node dysfunction, s/p ICD extraction after developing a pocket infection. He has done well in the interim. He denies fever or chills. No syncope. His temp.-perm. PM is working normally. He is anxious to get rid of his life vest and temp PM. Allergies  Allergen Reactions  . Methimazole [Thiamazole] Itching and Rash    Severe rash     Current Outpatient Prescriptions  Medication Sig Dispense Refill  . acetaminophen (TYLENOL) 500 MG tablet Take 500 mg by mouth every 6 (six) hours as needed for moderate pain.       Marland Kitchen albuterol (PROVENTIL HFA;VENTOLIN HFA) 108 (90 BASE) MCG/ACT inhaler Inhale 2 puffs into the lungs every 4 (four) hours as needed for shortness of breath.       . allopurinol (ZYLOPRIM) 100 MG tablet Take 100 mg by mouth daily.       Marland Kitchen amiodarone (PACERONE) 200 MG tablet Take 200 mg by mouth daily.       Marland Kitchen aspirin 81 MG chewable tablet Chew 81 mg by mouth daily.      . bisoprolol (ZEBETA) 10 MG tablet Take 10 mg by mouth daily.      . budesonide-formoterol (SYMBICORT) 160-4.5 MCG/ACT inhaler Inhale 2 puffs into the lungs 2 (two) times daily.      . Cholecalciferol (VITAMIN D) 2000 UNITS tablet Take 2,000 Units by mouth daily.      Marland Kitchen docusate sodium (COLACE) 100 MG capsule Take 100 mg by mouth daily as needed for mild constipation.      . fenofibrate 160 MG tablet Take 160 mg by mouth daily.      . furosemide (LASIX) 40 MG tablet Take 40-80 mg by mouth See admin instructions. Take 2 tablets by mouth Monday, Wednesday, and Friday.  Take 1 tablet by mouth all other days      . isosorbide mononitrate (IMDUR) 60 MG 24 hr tablet Take 60 mg by mouth daily.      Marland Kitchen levothyroxine (SYNTHROID, LEVOTHROID) 100 MCG tablet Take 1 tablet (100 mcg total) by mouth daily.  30 tablet  4  . loratadine (CLARITIN) 10 MG tablet Take 10 mg by mouth every morning.       . nystatin (MYCOSTATIN) powder Apply  topically as needed.      . potassium chloride SA (K-DUR,KLOR-CON) 20 MEQ tablet Take 20 mEq by mouth every other day.      . Warfarin Sodium (COUMADIN PO) Take 1 tablet daily (7 mg)       No current facility-administered medications for this visit.     Past Medical History  Diagnosis Date  . Atrial fibrillation     takes COumadin daily  . Depression   . CAD (coronary artery disease)     predominantly single vessel  . Ventricular tachycardia     s/p defibrillator-Medtronic EnTrust [id number]  . Dilated cardiomyopathy     s/p defibrillator Medtronic EnTrust [phone]  . Solitary kidney 06/05/2006  . Gout     takes Allopurinol daily  . History of hyperthyroidism   . Left knee DJD     needs surgery  . Asthma     Albuterol prn;Symbicort daily  . Constipation     takes Colace daily as needed  . Hyperlipidemia     takes Fenofibrate daily  . Seasonal allergies     takes Claritin daily  . Hypertension  takes Bisoprolol daily as well as Imdur  . Myocardial infarction 2007  . Peripheral edema     takes Furosemide daily  . Urinary incontinence   . Shortness of breath     with exertion  . Dizziness     occasionally  . Joint pain   . Joint swelling   . Bruises easily     d/t being on Coumadin  . GERD (gastroesophageal reflux disease)     takes OTC med if needed  . Urinary frequency   . Urinary urgency   . Cataracts, bilateral to be removed in Aug 2015  . Pacemaker   . Pneumonia, community acquired last time 2014    "get it ~ q yr; haven't had it yet in 2015" (12/20/2013)  . OSA (obstructive sleep apnea)     "don't wear mask; can't afford one" (12/20/2013)  . Hypothyroidism     takes Synthroid daily  . Chronic kidney disease 1976    S/P nephrectomy    ROS:   All systems reviewed and negative except as noted in the HPI.   Past Surgical History  Procedure Laterality Date  . Cardiac defibrillator placement  2007    Medtronic EnTrust [phone]  . Nephrectomy  1976    Right  .  Cardiac catheterization  2007    60% Stenosis mid-CFX  . Knee arthroscopy Left   . Cardioversion  06/18/2011    Procedure: CARDIOVERSION;  Surgeon: Kerry Hough., MD;  Location: [facility] OR;  Service: Cardiovascular;  Laterality: N/A;  . Tee without cardioversion  07/13/2012    Procedure: TRANSESOPHAGEAL ECHOCARDIOGRAM (TEE);  Surgeon: Lelon Perla, MD;  Location: [facility] ENDOSCOPY;  Service: Cardiovascular;  Laterality: N/A;  . Cardioversion N/A 12/06/2012    Procedure: CARDIOVERSION;  Surgeon: Jacolyn Reedy, MD;  Location: [facility] ENDOSCOPY;  Service: Cardiovascular;  Laterality: N/A;  . Colonoscopy    . Multiple tooth extractions      "top's are all gone; took some off the bottom too"  . Icd generator change  07/2012  . Cardiac defibrillator removal  12/20/2013  . Insert / replace / remove pacemaker  12/20/2013  . Icd lead removal Left 12/20/2013    Procedure: ICD LEAD REMOVAL//EXTRACTION AND PLACEMENT OF TEMP/PERM ATRIAL LEAD;  Surgeon: Evans Lance, MD;  Location: [facility] OR;  Service: Cardiovascular;  Laterality: Left;     Family History  Problem Relation Age of Onset  . Heart disease Other     uncle  . Diabetes Other     uncle     History   Social History  . Marital Status: Single    Spouse Name: N/A    Number of Children: N/A  . Years of Education: N/A   Occupational History  . disabled    Social History Main Topics  . Smoking status: Never Smoker   . Smokeless tobacco: Never Used  . Alcohol Use: Yes     Comment: 12/20/2013 "might have drank a couple beers before; never really drank"  . Drug Use: No  . Sexual Activity: No   Other Topics Concern  . Not on file   Social History Narrative   Never married.  Worked previously in [employer].     BP 132/84  Pulse 70  Ht 5\' 6"  (1.676 m)  Wt 249 lb 6.4 oz (113.127 kg)  BMI 40.27 kg/m2  Physical Exam:  stable appearing 68 yo man, NAD HEENT: Unremarkable Neck:  No JVD, no thyromegally Back:  No CVA  tenderness Lungs:  Clear with no wheezes HEART:  Regular rate rhythm, no murmurs, no rubs, no clicks Abd:  soft, positive bowel sounds, no organomegally, no rebound, no guarding Ext:  2 plus pulses, no edema, no cyanosis, no clubbing Skin:  No rashes no nodules Neuro:  CN II through XII intact, motor grossly intact  EKG - atrial pacing   Assess/Plan:

## 2014-01-25 NOTE — CV Procedure (Signed)
SURGEON:  Cristopher Peru, MD      PREPROCEDURE DIAGNOSES:   1. Ischemic cardiomyopathy.   2. New York Heart Association class III, heart failure chronically.   3. VT, hemodynamically important  4. Sinus node dysfunction     POSTPROCEDURE DIAGNOSES:   1. Ischemic cardiomyopathy.   2. New York Heart Association class III heart failure chronically.   3. VT, hemodynamically important  4. Sinus node dysfunction  PROCEDURES:    1. ICD implantation.  2. Venography of the right upper extremity venous system  3. Defibrillation threshold testing     INTRODUCTION:  Cody Faulkner is a 68 y.o. male with an ischemic CM (EF 30%), VT, NYHA Class III CHF, and CAD. At this time, he meets MADIT II/ SCD-HeFT criteria for ICD implantation for primary prevention of sudden death.  The patient has a narrow QRS and does not meet criteria for revascularization.  He has sinus node dysfunction and will require atrial pacing. He has had a h/o sustained VT and was wearing a life vest. 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  The patient therefore  presents today for ICD implantation.      DESCRIPTION OF PROCEDURE:  Informed written consent was obtained and the patient was brought to the electrophysiology lab in the fasting state. The patient was adequately sedated with intravenous Versed, and fentanyl as outlined in the nursing report.  The patient's right chest was prepped and draped in the usual sterile fashion by the EP lab staff.  The skin overlying the right deltopectoral region was infiltrated with lidocaine for local analgesia.  A 5-cm incision was made over the right deltopectoral region.  A right subcutaneous defibrillator pocket was fashioned using a combination of sharp and blunt dissection.  Electrocautery was used to assure hemostasis.   Right Upper extremity  Venography:  A venogram of the right upper extremity was performed which revealed a moderate sized right axillary vein which emptied into a large but very deep right subclavian vein.    RA/RV Lead Placement: The right axillary vein was cannulated with fluoroscopic visualization.  Through the right axillary vein, a Medtronic  (serial # [id number]  ) right atrial lead and a Medtronic (serial number [id number]) right ventricular defibrillator lead were advanced with fluoroscopic visualization into the right atrial appendage and right ventricular apical septal positions respectively.  Initial atrial lead P-waves measured 2.0 mV with an impedance of 700 ohms and a threshold of 1.1 volts at 0.5 milliseconds.  The right ventricular lead R-wave measured 13 mV with impedance of 823 ohms and a threshold of 0.9 volts at 0.5 milliseconds.   The leads were secured to the pectoralis  fascia using #2 silk suture over the suture sleeves.  The pocket then  irrigated with copious gentamicin solution.  The leads were then  connected to a Medtronic DDD (serial  Number [id number]) ICD.  The defibrillator was placed into the  pocket.  The pocket was then closed in 2 layers with 2.0 Vicryl suture  for the subcutaneous and subcuticular layers.  Steri-Strips and a  sterile dressing were then applied.   DFT Testing: Defibrillation Threshold testing was then performed. Ventricular fibrillation was induced with a T shock.  Adequate sensing of ventricular fibrillation was observed with minimal dropout with a programmed sensitivity of 1.56mV.  The patient was successfully defibrillated to sinus  rhythm with a single 20 joules shock delivered from the device with an impedance of 75 ohms in a duration of 9 seconds.  The patient remained in sinus rhythm thereafter.  There were no early apparent complications.     CONCLUSIONS:   1. Ischemic cardiomyopathy with chronic New York Heart Association class III heart failure.   2. Successful ICD  implantation.   3. DFT less than or equal to 20 joules.   4. No early apparent complications.   Cristopher Peru, MD  3:47 PM 01/25/2014

## 2014-01-25 NOTE — Interval H&P Note (Signed)
History and Physical Interval Note:                       Since prior clinic visit, no change in the history, physical exam, assessment and plan.          The various methods of treatment have been discussed with the patient and family. After consideration of risks, benefits and other options for treatment, the patient has consented to  Procedure(s): IMPLANTABLE CARDIOVERTER DEFIBRILLATOR IMPLANT (N/A) as a surgical intervention .  The patient's history has been reviewed, patient examined, no change in status, stable for surgery.  I have reviewed the patient's chart and labs.  Questions were answered to the patient's satisfaction.     Mikle Bosworth.D.

## 2014-01-26 ENCOUNTER — Ambulatory Visit (HOSPITAL_COMMUNITY): Payer: Medicare Other

## 2014-01-26 DIAGNOSIS — I2589 Other forms of chronic ischemic heart disease: Secondary | ICD-10-CM | POA: Diagnosis not present

## 2014-01-26 DIAGNOSIS — I472 Ventricular tachycardia: Secondary | ICD-10-CM | POA: Diagnosis not present

## 2014-01-26 DIAGNOSIS — I495 Sick sinus syndrome: Secondary | ICD-10-CM | POA: Diagnosis not present

## 2014-01-26 DIAGNOSIS — I4729 Other ventricular tachycardia: Secondary | ICD-10-CM | POA: Diagnosis not present

## 2014-01-26 DIAGNOSIS — I509 Heart failure, unspecified: Secondary | ICD-10-CM | POA: Diagnosis not present

## 2014-01-26 LAB — PROTIME-INR
INR: 1.64 — ABNORMAL HIGH (ref 0.00–1.49)
Prothrombin Time: 19.4 seconds — ABNORMAL HIGH (ref 11.6–15.2)

## 2014-01-26 NOTE — Discharge Instructions (Signed)
° °  Supplemental Discharge Instructions for  Pacemaker/Defibrillator Patients  Activity No heavy lifting or vigorous activity with your left/right arm for 6 to 8 weeks.  Do not raise your left/right arm above your head for one week.  Gradually raise your affected arm as drawn below.                       08/24                   08/25                    08/26                    08/27            NO DRIVING for  one week    ; you may begin driving on     45/40     . WOUND CARE   Keep the wound area clean and dry.  Do not get this area wet for one week. No showers for one week; you may shower on      08/28        .   The tape/steri-strips on your wound will fall off; do not pull them off.  No bandage is needed on the site.  DO  NOT apply any creams, oils, or ointments to the wound area.   If you notice any drainage or discharge from the wound, any swelling or bruising at the site, or you develop a fever > 101? F after you are discharged home, call the office at once.  Special Instructions   You are still able to use cellular telephones; use the ear opposite the side where you have your pacemaker/defibrillator.  Avoid carrying your cellular phone near your device.   When traveling through airports, show security personnel your identification card to avoid being screened in the metal detectors.  Ask the security personnel to use the hand wand.   Avoid arc welding equipment, MRI testing (magnetic resonance imaging), TENS units (transcutaneous nerve stimulators).  Call the office for questions about other devices.   Avoid electrical appliances that are in poor condition or are not properly grounded.   Microwave ovens are safe to be near or to operate.  Additional information for defibrillator patients should your device go off:   If your device goes off ONCE and you feel fine afterward, notify the device clinic nurses.   If your device goes off ONCE and you do not feel well afterward, call 911.   If your device goes off TWICE, call 911.   If your device goes off THREE times in one day, call 911.  DO NOT DRIVE YOURSELF OR A FAMILY MEMBER WITH A DEFIBRILLATOR TO THE HOSPITAL--CALL 911.

## 2014-01-26 NOTE — Progress Notes (Signed)
UR Completed Renwick Asman Graves-Bigelow, RN,BSN [phone]  

## 2014-01-26 NOTE — Progress Notes (Signed)
Report given to [facility] manager at [facility].  Patient to be discharged to [facility].  Patient alert, oriented, verbally responsive, breathing regular and non-labored throughout, no s/s of distress noted throughout, no c/o pain throughout.  Discharge instructions verbalized to [facility] at [facility].  Patient left unit per wheelchair accompanied by volunteer.  Patient to be transported to [facility] per family member.  VS WNL.  Dirk Dress 01/26/2014 1:53 PM

## 2014-01-26 NOTE — Discharge Summary (Signed)
ELECTROPHYSIOLOGY PROCEDURE DISCHARGE SUMMARY    Patient ID: Cody Faulkner,  MRN: [id number], DOB/AGE: 1945-08-25 68 y.o.  Admit date: 01/25/2014 Discharge date: 01/26/2014  Primary Care Physician: Cody Sake, MD Electrophysiologist: Cody Faulkner  Primary Discharge Diagnosis:  Ischemic cardiomyopathy, sinus node dysfunction s/p ICD implant with subsequent infection and extraction with ICD re-implantation this admission  Secondary Discharge Diagnosis:  1.  Ventricular tachycardia 2.  Atrial fibrillation 3.  Chronic anticoagulation with Warfarin 4.  Hyperlipidemia 5.  Hypertension 6.  CAD  7.  Sleep apnea 8.  Obesity 9.  Chronic kidney disease  Allergies  Allergen Reactions  . Methimazole [Thiamazole] Itching and Rash    Severe rash     Procedures This Admission:  1.  Implantation of a MDT dual chamber ICD on 01-25-14 by Dr Cody Faulkner.  The patient received a MDT ICD with model number 5076 right atrial lead, 6935 right ventricular lead.  DFT's were successful at 66 J.  There were no immediate post procedure complications. 2.  CXR on 01-26-14 demonstrated no pneumothorax status post device implantation.   Brief HPI: Cody Faulkner is a 68 y.o. male with a past medical history as outlined above.  He had a previous ICD implanted in 2007 for ischemic cardiomyopathy and sinus node dysfunction with subsequent appropriate therapy for VT.  He developed a pocket infection and underwent device extraction and temp-perm pacemaker implantation 12-2013.  He wore a lifevest while device was removed.  He has remained clear of infection and risks, benefits, and alternatives to device re-implantation were discussed with the patient who wished to proceed.   Hospital Course:  The patient was admitted and underwent implantation of a MDT dual chamber ICD with removal of previously implanted temp-perm pacemaker with details as outlined above.   He was monitored on telemetry overnight which demonstrated  atrial pacing with intrinsic ventricular conduction.  Right chest was without hematoma or ecchymosis.  The device was interrogated and found to be functioning normally.  CXR was obtained and demonstrated no pneumothorax status post device implantation.  Wound care, arm mobility, and restrictions were reviewed with the patient.  Dr Cody Faulkner examined the patient and considered them stable for discharge to home.   The patient's discharge medications include a beta blocker (Bisoprolol).  He is not on an ACE-I due to renal insuffiencey.   Discharge Vitals: Blood pressure 136/79, pulse 59, temperature 97.3 F (36.3 C), temperature source Oral, resp. rate 18, height 5\' 6"  (1.676 m), weight 241 lb 11.2 oz (109.634 kg), SpO2 98.00%.   Labs:   Lab Results  Component Value Date   WBC 6.7 01/18/2014   HGB 12.8* 01/18/2014   HCT 38.1* 01/18/2014   MCV 84.8 01/18/2014   PLT 191.0 01/18/2014    Recent Labs Lab 01/25/14 1308  NA 141  K 4.5  CL 105  CO2 25  BUN 18  CREATININE 1.74*  CALCIUM 9.4  GLUCOSE 90     Discharge Medications:    Medication List    ASK your doctor about these medications       acetaminophen 500 MG tablet  Commonly known as:  TYLENOL  Take 500 mg by mouth every 6 (six) hours as needed for moderate pain.     albuterol 108 (90 BASE) MCG/ACT inhaler  Commonly known as:  PROVENTIL HFA;VENTOLIN HFA  Inhale 2 puffs into the lungs every 4 (four) hours as needed for shortness of breath.     allopurinol 100 MG tablet  Commonly  known as:  ZYLOPRIM  Take 100 mg by mouth daily.     amiodarone 200 MG tablet  Commonly known as:  PACERONE  Take 200 mg by mouth daily.     aspirin 81 MG chewable tablet  Chew 81 mg by mouth daily.     bisoprolol 10 MG tablet  Commonly known as:  ZEBETA  Take 10 mg by mouth daily.     budesonide-formoterol 160-4.5 MCG/ACT inhaler  Commonly known as:  SYMBICORT  Inhale 2 puffs into the lungs 2 (two) times daily.     COUMADIN PO  Take 7 mg  by mouth daily. Take 1 tablet daily (7 mg)- ON HOLD STARTING 8/14 AND RECHECK PT/INR Monday 8/17     docusate sodium 100 MG capsule  Commonly known as:  COLACE  Take 100 mg by mouth daily as needed for mild constipation.     fenofibrate 160 MG tablet  Take 160 mg by mouth daily.     furosemide 40 MG tablet  Commonly known as:  LASIX  Take 40-80 mg by mouth See admin instructions. Take 2 tablets by mouth Monday, Wednesday, and Friday.  Take 1 tablet by mouth all other days     isosorbide mononitrate 60 MG 24 hr tablet  Commonly known as:  IMDUR  Take 60 mg by mouth daily.     levothyroxine 100 MCG tablet  Commonly known as:  SYNTHROID, LEVOTHROID  Take 1 tablet (100 mcg total) by mouth daily.     loratadine 10 MG tablet  Commonly known as:  CLARITIN  Take 10 mg by mouth every morning.     MUCINEX DM MAXIMUM STRENGTH 60-1200 MG Tb12  Take 1 tablet by mouth 2 (two) times daily.     nystatin powder  Commonly known as:  MYCOSTATIN  Apply 1 g topically 3 (three) times daily as needed (itching).     potassium chloride SA 20 MEQ tablet  Commonly known as:  K-DUR,KLOR-CON  Take 20 mEq by mouth every other day.     Vitamin D 2000 UNITS tablet  Take 2,000 Units by mouth daily.        Disposition:     Duration of Discharge Encounter: Greater than 30 minutes including physician time.  Signed, Cody Faulkner.D.

## 2014-01-26 NOTE — Progress Notes (Signed)
Right chest appears swollen.  It is soft and cool to the touch.  The pt reports very mild pain with palpation of site but otherwise he denies pain.  Dressing is clean, dry, and intact.  Vitals stable, pt afebrile.  MD on call, Dr. Radford Pax, notified.  No new orders at this time. Will continue to monitor.

## 2014-01-26 NOTE — Progress Notes (Signed)
Patient ID: Cody Faulkner, male   DOB: 01/24/1946, 68 y.o.   MRN: [id number] ICD Criteria  Current LVEF:30% ;Obtained > or = 1 month ago and < or = 3 months ago.  NYHA Functional Classification: Class II  Heart Failure History:  Yes, Duration of heart failure since onset is > 9 months  Non-Ischemic Dilated Cardiomyopathy History:  No.  Atrial Fibrillation/Atrial Flutter:  No.  Ventricular Tachycardia History:  Yes, Hemodynamic instability present, VT Type:  SVT - Monomorphic.  Cardiac Arrest History:  No  History of Syndromes with Risk of Sudden Death:  No.  Previous ICD:  Yes, ICD Type:  Dual, Reason for ICD:  Secondary, reason for secondary prevention:  Ventricular Tachycardia  Electrophysiology Study: No.  Prior MI: Yes, Most recent MI timeframe is > 40 days.  PPM: No.  OSA:  No  Patient Life Expectancy of >=1 year: Yes.  Anticoagulation Therapy:  Patient is NOT on anticoagulation therapy.   Beta Blocker Therapy:  Yes.   Ace Inhibitor/ARB Therapy:  No, Reason not on Ace Inhibitor/ARB therapy:  renal insufficiency

## 2014-02-07 ENCOUNTER — Ambulatory Visit (INDEPENDENT_AMBULATORY_CARE_PROVIDER_SITE_OTHER): Payer: Medicare Other | Admitting: *Deleted

## 2014-02-07 DIAGNOSIS — I472 Ventricular tachycardia, unspecified: Secondary | ICD-10-CM

## 2014-02-07 DIAGNOSIS — I5032 Chronic diastolic (congestive) heart failure: Secondary | ICD-10-CM

## 2014-02-07 DIAGNOSIS — I4729 Other ventricular tachycardia: Secondary | ICD-10-CM

## 2014-02-07 LAB — MDC_IDC_ENUM_SESS_TYPE_INCLINIC
Battery Remaining Longevity: 113 mo
Battery Voltage: 3.09 V
Brady Statistic AP VP Percent: 0.05 %
Brady Statistic AP VS Percent: 99.89 %
Brady Statistic AS VP Percent: 0 %
Brady Statistic AS VS Percent: 0.06 %
Brady Statistic RA Percent Paced: 99.94 %
Brady Statistic RV Percent Paced: 0.05 %
Date Time Interrogation Session: 20150902130623
HighPow Impedance: 209 Ohm
HighPow Impedance: 59 Ohm
Lead Channel Impedance Value: 437 Ohm
Lead Channel Impedance Value: 494 Ohm
Lead Channel Pacing Threshold Amplitude: 0.75 V
Lead Channel Pacing Threshold Amplitude: 1 V
Lead Channel Pacing Threshold Pulse Width: 0.4 ms
Lead Channel Pacing Threshold Pulse Width: 0.4 ms
Lead Channel Sensing Intrinsic Amplitude: 16.5 mV
Lead Channel Sensing Intrinsic Amplitude: 18.5 mV
Lead Channel Sensing Intrinsic Amplitude: 2.375 mV
Lead Channel Sensing Intrinsic Amplitude: 2.875 mV
Lead Channel Setting Pacing Amplitude: 3.5 V
Lead Channel Setting Pacing Amplitude: 3.5 V
Lead Channel Setting Pacing Pulse Width: 0.4 ms
Lead Channel Setting Sensing Sensitivity: 0.3 mV
Zone Setting Detection Interval: 300 ms
Zone Setting Detection Interval: 350 ms
Zone Setting Detection Interval: 350 ms
Zone Setting Detection Interval: 360 ms

## 2014-02-07 NOTE — Progress Notes (Signed)
Wound check appointment. Steri-strips removed. Wound without redness or edema. Incision edges approximated, wound well healed. Normal device function.  Left chest site well healed.   Thresholds, sensing, and impedances consistent with implant measurements. Device programmed at 3.5V for extra safety margin until 3 month visit. Histogram distribution appropriate for patient and level of activity. No mode switches or ventricular arrhythmias noted. Patient educated about wound care, arm mobility, lifting restrictions, shock plan. ROV in 3 months with implanting physician.

## 2014-02-15 ENCOUNTER — Encounter: Payer: Self-pay | Admitting: Internal Medicine

## 2014-04-26 ENCOUNTER — Encounter: Payer: Self-pay | Admitting: Internal Medicine

## 2014-04-26 ENCOUNTER — Ambulatory Visit (INDEPENDENT_AMBULATORY_CARE_PROVIDER_SITE_OTHER): Payer: Medicare Other | Admitting: Internal Medicine

## 2014-04-26 VITALS — BP 128/82 | HR 60 | Ht 65.5 in | Wt 242.2 lb

## 2014-04-26 DIAGNOSIS — I420 Dilated cardiomyopathy: Secondary | ICD-10-CM

## 2014-04-26 DIAGNOSIS — I480 Paroxysmal atrial fibrillation: Secondary | ICD-10-CM

## 2014-04-26 DIAGNOSIS — I472 Ventricular tachycardia, unspecified: Secondary | ICD-10-CM

## 2014-04-26 DIAGNOSIS — I4729 Other ventricular tachycardia: Secondary | ICD-10-CM

## 2014-04-26 DIAGNOSIS — Z95810 Presence of automatic (implantable) cardiac defibrillator: Secondary | ICD-10-CM

## 2014-04-26 DIAGNOSIS — I5032 Chronic diastolic (congestive) heart failure: Secondary | ICD-10-CM

## 2014-04-26 LAB — MDC_IDC_ENUM_SESS_TYPE_INCLINIC
Brady Statistic AP VS Percent: 93.64 %
Brady Statistic AS VS Percent: 0.54 %
Brady Statistic RA Percent Paced: 98.54 %
Brady Statistic RV Percent Paced: 5.82 %
Date Time Interrogation Session: 20151119142948
HighPow Impedance: 209 Ohm
HighPow Impedance: 74 Ohm
Lead Channel Impedance Value: 1539 Ohm
Lead Channel Pacing Threshold Amplitude: 0.75 V
Lead Channel Pacing Threshold Pulse Width: 0.4 ms
Lead Channel Pacing Threshold Pulse Width: 0.4 ms
Lead Channel Sensing Intrinsic Amplitude: 1.75 mV
Lead Channel Sensing Intrinsic Amplitude: 20.625 mV
Lead Channel Setting Pacing Amplitude: 2 V
Lead Channel Setting Pacing Pulse Width: 0.4 ms
MDC IDC MSMT BATTERY REMAINING LONGEVITY: 108 mo
MDC IDC MSMT BATTERY VOLTAGE: 3.07 V
MDC IDC MSMT LEADCHNL RA PACING THRESHOLD AMPLITUDE: 0.875 V
MDC IDC MSMT LEADCHNL RA SENSING INTR AMPL: 1.625 mV
MDC IDC MSMT LEADCHNL RV IMPEDANCE VALUE: 513 Ohm
MDC IDC MSMT LEADCHNL RV SENSING INTR AMPL: 20.875 mV
MDC IDC SET LEADCHNL RA PACING AMPLITUDE: 3.75 V
MDC IDC SET LEADCHNL RV SENSING SENSITIVITY: 0.3 mV
MDC IDC SET ZONE DETECTION INTERVAL: 350 ms
MDC IDC SET ZONE DETECTION INTERVAL: 360 ms
MDC IDC STAT BRADY AP VP PERCENT: 4.9 %
MDC IDC STAT BRADY AS VP PERCENT: 0.92 %
Zone Setting Detection Interval: 300 ms
Zone Setting Detection Interval: 350 ms

## 2014-04-26 NOTE — Assessment & Plan Note (Signed)
He appears to be maintaining sinus rhythm. He will continue amiodarone therapy.

## 2014-04-26 NOTE — Progress Notes (Signed)
HPI [name] returns today for followup. He is a pleasant 68 yo man with an ICM, VT, sinus node dysfunction, s/p ICD extraction after developing a pocket infection. He has done well in the interim. He denies fever or chills. No syncope. No peripheral edema. Allergies  Allergen Reactions  . Methimazole [Thiamazole] Itching and Rash    Severe rash     Current Outpatient Prescriptions  Medication Sig Dispense Refill  . bisoprolol (ZEBETA) 10 MG tablet Take 10 mg by mouth daily.    . Warfarin Sodium (COUMADIN PO) Take 7 mg by mouth daily. Take as directed by the coumadin clinic    . acetaminophen (TYLENOL) 500 MG tablet Take 500 mg by mouth every 6 (six) hours as needed for moderate pain.     Marland Kitchen albuterol (PROVENTIL HFA;VENTOLIN HFA) 108 (90 BASE) MCG/ACT inhaler Inhale 2 puffs into the lungs every 4 (four) hours as needed for shortness of breath.     . allopurinol (ZYLOPRIM) 100 MG tablet Take 100 mg by mouth daily.     Marland Kitchen amiodarone (PACERONE) 200 MG tablet Take 200 mg by mouth daily.     . budesonide-formoterol (SYMBICORT) 160-4.5 MCG/ACT inhaler Inhale 2 puffs into the lungs 2 (two) times daily.    . Cholecalciferol (VITAMIN D) 2000 UNITS tablet Take 2,000 Units by mouth daily.    Marland Kitchen Dextromethorphan-Guaifenesin (MUCINEX DM MAXIMUM STRENGTH) 60-1200 MG TB12 Take 1 tablet by mouth 2 (two) times daily.    Marland Kitchen docusate sodium (COLACE) 100 MG capsule Take 100 mg by mouth daily as needed for mild constipation.    . fenofibrate 160 MG tablet Take 160 mg by mouth daily.    . furosemide (LASIX) 40 MG tablet Take 40-80 mg by mouth See admin instructions. Take 2 tablets by mouth Monday, Wednesday, and Friday.  Take 1 tablet by mouth all other days    . isosorbide mononitrate (IMDUR) 60 MG 24 hr tablet Take 60 mg by mouth daily.    Marland Kitchen levothyroxine (SYNTHROID, LEVOTHROID) 100 MCG tablet Take 1 tablet (100 mcg total) by mouth daily. 30 tablet 4  . loratadine (CLARITIN) 10 MG tablet Take 10 mg by  mouth every morning.     . nystatin (MYCOSTATIN) powder Apply 1 g topically 3 (three) times daily as needed (itching).     . potassium chloride SA (K-DUR,KLOR-CON) 20 MEQ tablet Take 20 mEq by mouth every other day.     No current facility-administered medications for this visit.     Past Medical History  Diagnosis Date  . Atrial fibrillation     takes COumadin daily  . Depression   . CAD (coronary artery disease)     predominantly single vessel  . Ventricular tachycardia     s/p defibrillator-Medtronic EnTrust [id number]  . Dilated cardiomyopathy     s/p defibrillator Medtronic EnTrust [phone]  . Solitary kidney 06/05/2006  . Gout     takes Allopurinol daily  . History of hyperthyroidism   . Left knee DJD     needs surgery  . Asthma     Albuterol prn;Symbicort daily  . Constipation     takes Colace daily as needed  . Hyperlipidemia     takes Fenofibrate daily  . Seasonal allergies     takes Claritin daily  . Hypertension     takes Bisoprolol daily as well as Imdur  . Myocardial infarction 2007  . Peripheral edema     takes Furosemide daily  . Urinary  incontinence   . Shortness of breath     with exertion  . Dizziness     occasionally  . Joint pain   . Joint swelling   . Bruises easily     d/t being on Coumadin  . GERD (gastroesophageal reflux disease)     takes OTC med if needed  . Urinary frequency   . Urinary urgency   . Cataracts, bilateral to be removed in Aug 2015  . Pacemaker   . Pneumonia, community acquired last time 2014    "get it ~ q yr; haven't had it yet in 2015" (12/20/2013)  . OSA (obstructive sleep apnea)     "don't wear mask; can't afford one" (12/20/2013)  . Hypothyroidism     takes Synthroid daily  . Chronic kidney disease 1976    S/P nephrectomy  . Heart failure     NYHFA     CLASS 3  . Cardiomyopathy, ischemic     ROS:   All systems reviewed and negative except as noted in the HPI.   Past Surgical History  Procedure Laterality Date    . Cardiac defibrillator placement  2007    Medtronic EnTrust [phone]  . Nephrectomy  1976    Right  . Cardiac catheterization  2007    60% Stenosis mid-CFX  . Knee arthroscopy Left   . Cardioversion  06/18/2011    Procedure: CARDIOVERSION;  Surgeon: Kerry Hough., MD;  Location: [facility] OR;  Service: Cardiovascular;  Laterality: N/A;  . Tee without cardioversion  07/13/2012    Procedure: TRANSESOPHAGEAL ECHOCARDIOGRAM (TEE);  Surgeon: Lelon Perla, MD;  Location: [facility] ENDOSCOPY;  Service: Cardiovascular;  Laterality: N/A;  . Cardioversion N/A 12/06/2012    Procedure: CARDIOVERSION;  Surgeon: Jacolyn Reedy, MD;  Location: [facility] ENDOSCOPY;  Service: Cardiovascular;  Laterality: N/A;  . Colonoscopy    . Multiple tooth extractions      "top's are all gone; took some off the bottom too"  . Icd generator change  07/2012  . Cardiac defibrillator removal  12/20/2013  . Insert / replace / remove pacemaker  12/20/2013  . Icd lead removal Left 12/20/2013    Procedure: ICD LEAD REMOVAL//EXTRACTION AND PLACEMENT OF TEMP/PERM ATRIAL LEAD;  Surgeon: Evans Lance, MD;  Location: [facility] OR;  Service: Cardiovascular;  Laterality: Left;     Family History  Problem Relation Age of Onset  . Heart disease Other     uncle  . Diabetes Other     uncle     History   Social History  . Marital Status: Single    Spouse Name: N/A    Number of Children: N/A  . Years of Education: N/A   Occupational History  . disabled    Social History Main Topics  . Smoking status: Never Smoker   . Smokeless tobacco: Never Used  . Alcohol Use: Yes     Comment: 12/20/2013 "might have drank a couple beers before; never really drank"  . Drug Use: No  . Sexual Activity: No   Other Topics Concern  . Not on file   Social History Narrative   Never married.  Worked previously in [employer].     BP 128/82 mmHg  Ht 5' 5.5" (1.664 m)  Wt 242 lb 3.2 oz (109.861 kg)  BMI 39.68 kg/m2  Physical Exam:  stable  appearing 69 yo man, NAD HEENT: Unremarkable Neck:  No JVD, no thyromegally Back:  No CVA tenderness Lungs:  Clear with no  wheezes HEART:  Regular rate rhythm, no murmurs, no rubs, no clicks Abd:  soft, positive bowel sounds, no organomegally, no rebound, no guarding Ext:  2 plus pulses, no edema, no cyanosis, no clubbing Skin:  No rashes no nodules Neuro:  CN II through XII intact, motor grossly intact    Assess/Plan:

## 2014-04-26 NOTE — Assessment & Plan Note (Signed)
His ICD is working well except for problems with the atrial lead impedence. His thresholds and sensing are satisfactory except for some noise on the atrial lead which is intermittant and not reproducible. Will follow. Today we reprogrammed the atrial lead in hopes of avoiding oversensing of atrial lead noise.

## 2014-04-26 NOTE — Patient Instructions (Signed)
Remote monitoring is used to monitor your Pacemaker or ICD from home. This monitoring reduces the number of office visits required to check your device to one time per year. It allows [place] to keep an eye on the functioning of your device to ensure it is working properly. You are scheduled for a device check from home on 07/30/14. You may send your transmission at any time that day. If you have a wireless device, the transmission will be sent automatically. After your physician reviews your transmission, you will receive a postcard with your next transmission date.    Your physician wants you to follow-up in: 6 months with Dr. Knox Saliva will receive a reminder letter in the mail two months in advance. If you don't receive a letter, please call our office to schedule the follow-up appointment.

## 2014-04-26 NOTE — Assessment & Plan Note (Signed)
He has had no recurrent VT. No change in medical therapy.  

## 2014-04-26 NOTE — Assessment & Plan Note (Signed)
He has not lost weight. I have encouraged him to do so.

## 2014-05-17 ENCOUNTER — Encounter (HOSPITAL_COMMUNITY): Payer: Self-pay | Admitting: Internal Medicine

## 2014-06-28 ENCOUNTER — Other Ambulatory Visit: Payer: Self-pay | Admitting: Internal Medicine

## 2014-07-16 ENCOUNTER — Other Ambulatory Visit: Payer: Self-pay

## 2014-07-16 MED ORDER — ISOSORBIDE MONONITRATE ER 60 MG PO TB24: 60.0000 mg | ORAL_TABLET | Freq: Every day | ORAL | Status: DC

## 2014-07-16 MED ORDER — BISOPROLOL FUMARATE 10 MG PO TABS: 10.0000 mg | ORAL_TABLET | Freq: Every day | ORAL | Status: DC

## 2014-07-16 MED ORDER — FUROSEMIDE 40 MG PO TABS: ORAL_TABLET | ORAL | Status: DC

## 2014-07-30 ENCOUNTER — Telehealth: Payer: Self-pay | Admitting: Cardiology

## 2014-07-30 ENCOUNTER — Encounter: Payer: Medicaid Other | Admitting: *Deleted

## 2014-07-30 NOTE — Telephone Encounter (Signed)
LMOVM reminding pt to send remote transmission.   

## 2014-07-31 ENCOUNTER — Encounter: Payer: Self-pay | Admitting: Cardiology

## 2014-08-09 ENCOUNTER — Encounter: Payer: Self-pay | Admitting: Internal Medicine

## 2014-08-09 ENCOUNTER — Ambulatory Visit (INDEPENDENT_AMBULATORY_CARE_PROVIDER_SITE_OTHER): Payer: PPO | Admitting: Internal Medicine

## 2014-08-09 VITALS — BP 132/88 | HR 72 | Ht 65.5 in | Wt 247.8 lb

## 2014-08-09 DIAGNOSIS — I480 Paroxysmal atrial fibrillation: Secondary | ICD-10-CM

## 2014-08-09 DIAGNOSIS — I472 Ventricular tachycardia: Secondary | ICD-10-CM

## 2014-08-09 DIAGNOSIS — I4729 Other ventricular tachycardia: Secondary | ICD-10-CM

## 2014-08-09 DIAGNOSIS — I495 Sick sinus syndrome: Secondary | ICD-10-CM

## 2014-08-09 DIAGNOSIS — I5032 Chronic diastolic (congestive) heart failure: Secondary | ICD-10-CM | POA: Diagnosis not present

## 2014-08-09 DIAGNOSIS — Z95810 Presence of automatic (implantable) cardiac defibrillator: Secondary | ICD-10-CM | POA: Diagnosis not present

## 2014-08-09 DIAGNOSIS — I119 Hypertensive heart disease without heart failure: Secondary | ICD-10-CM

## 2014-08-09 LAB — MDC_IDC_ENUM_SESS_TYPE_INCLINIC
Battery Remaining Longevity: 102 mo
Brady Statistic AP VP Percent: 77.07 %
Brady Statistic AP VS Percent: 4.27 %
Brady Statistic AS VP Percent: 17.08 %
Brady Statistic RA Percent Paced: 81.34 %
Brady Statistic RV Percent Paced: 94.15 %
Date Time Interrogation Session: 20160303171627
HIGH POWER IMPEDANCE MEASURED VALUE: 228 Ohm
HIGH POWER IMPEDANCE MEASURED VALUE: 71 Ohm
Lead Channel Impedance Value: 4047 Ohm
Lead Channel Impedance Value: 513 Ohm
Lead Channel Pacing Threshold Amplitude: 0.5 V
Lead Channel Sensing Intrinsic Amplitude: 2 mV
Lead Channel Setting Pacing Amplitude: 2.5 V
Lead Channel Setting Pacing Pulse Width: 0.4 ms
Lead Channel Setting Sensing Sensitivity: 0.3 mV
MDC IDC MSMT BATTERY VOLTAGE: 2.99 V
MDC IDC MSMT LEADCHNL RV PACING THRESHOLD PULSEWIDTH: 0.4 ms
MDC IDC MSMT LEADCHNL RV SENSING INTR AMPL: 18.875 mV
MDC IDC SET ZONE DETECTION INTERVAL: 350 ms
MDC IDC SET ZONE DETECTION INTERVAL: 350 ms
MDC IDC STAT BRADY AS VS PERCENT: 1.58 %
Zone Setting Detection Interval: 300 ms
Zone Setting Detection Interval: 360 ms

## 2014-08-09 NOTE — Patient Instructions (Signed)
Your physician recommends that you schedule a follow-up appointment in: 4 weeks with Dr Lovena Le.  May cancel if feeling better

## 2014-08-09 NOTE — Progress Notes (Signed)
HPI [name] returns today for followup. He is a pleasant 69 yo man with an ICM, VT, sinus node dysfunction, s/p ICD extraction after developing a pocket infection. He has done well in the interim. He denies fever or chills. No syncope. No peripheral edema. Unfortunately, he has been found to have dysfunction and his atrial lead. He has adequate sensing, his atrial pacing impedance in the bipolar mode is now greater than 3000. In addition, his atrial threshold is over 6 V. He denies any trauma. No other complaints today. Allergies  Allergen Reactions  . Methimazole [Thiamazole] Itching and Rash    Severe rash     Current Outpatient Prescriptions  Medication Sig Dispense Refill  . acetaminophen (TYLENOL) 500 MG tablet Take 500 mg by mouth every 6 (six) hours as needed for moderate pain.     Marland Kitchen albuterol (PROVENTIL HFA;VENTOLIN HFA) 108 (90 BASE) MCG/ACT inhaler Inhale 2 puffs into the lungs every 4 (four) hours as needed for shortness of breath.     . allopurinol (ZYLOPRIM) 100 MG tablet Take 100 mg by mouth daily.     Marland Kitchen amiodarone (PACERONE) 200 MG tablet Take 200 mg by mouth daily.     . bisoprolol (ZEBETA) 10 MG tablet Take 1 tablet (10 mg total) by mouth daily. 30 tablet 1  . budesonide-formoterol (SYMBICORT) 160-4.5 MCG/ACT inhaler Inhale 2 puffs into the lungs 2 (two) times daily.    . Cholecalciferol (VITAMIN D) 2000 UNITS tablet Take 2,000 Units by mouth daily.    Marland Kitchen docusate sodium (COLACE) 100 MG capsule Take 100 mg by mouth daily as needed for mild constipation.    . fenofibrate 160 MG tablet Take 160 mg by mouth daily.    . furosemide (LASIX) 40 MG tablet Take 2 tablets by mouth Monday, Wednesday, and Friday.  Take 1 tablet by mouth all other days 30 tablet 1  . isosorbide mononitrate (IMDUR) 60 MG 24 hr tablet Take 1 tablet (60 mg total) by mouth daily. 30 tablet 1  . levothyroxine (SYNTHROID, LEVOTHROID) 100 MCG tablet Take 1 tablet (100 mcg total) by mouth daily. 30  tablet 4  . loratadine (CLARITIN) 10 MG tablet Take 10 mg by mouth every morning.     . nystatin (MYCOSTATIN) powder Apply 1 g topically 3 (three) times daily as needed (itching).     . potassium chloride SA (K-DUR,KLOR-CON) 20 MEQ tablet Take 20 mEq by mouth every other day.    . Warfarin Sodium (COUMADIN PO) Take as directed by the coumadin clinic     No current facility-administered medications for this visit.     Past Medical History  Diagnosis Date  . Atrial fibrillation     takes COumadin daily  . Depression   . CAD (coronary artery disease)     predominantly single vessel  . Ventricular tachycardia     s/p defibrillator-Medtronic EnTrust [id number]  . Dilated cardiomyopathy     s/p defibrillator Medtronic EnTrust [phone]  . Solitary kidney 06/05/2006  . Gout     takes Allopurinol daily  . History of hyperthyroidism   . Left knee DJD     needs surgery  . Asthma     Albuterol prn;Symbicort daily  . Constipation     takes Colace daily as needed  . Hyperlipidemia     takes Fenofibrate daily  . Seasonal allergies     takes Claritin daily  . Hypertension     takes Bisoprolol daily as well as  Imdur  . Myocardial infarction 2007  . Peripheral edema     takes Furosemide daily  . Urinary incontinence   . Shortness of breath     with exertion  . Dizziness     occasionally  . Joint pain   . Joint swelling   . Bruises easily     d/t being on Coumadin  . GERD (gastroesophageal reflux disease)     takes OTC med if needed  . Urinary frequency   . Urinary urgency   . Cataracts, bilateral to be removed in Aug 2015  . Pacemaker   . Pneumonia, community acquired last time 2014    "get it ~ q yr; haven't had it yet in 2015" (12/20/2013)  . OSA (obstructive sleep apnea)     "don't wear mask; can't afford one" (12/20/2013)  . Hypothyroidism     takes Synthroid daily  . Chronic kidney disease 1976    S/P nephrectomy  . Heart failure     NYHFA     CLASS 3  . Cardiomyopathy,  ischemic     ROS:   All systems reviewed and negative except as noted in the HPI.   Past Surgical History  Procedure Laterality Date  . Cardiac defibrillator placement  2007    Medtronic EnTrust [phone]  . Nephrectomy  1976    Right  . Cardiac catheterization  2007    60% Stenosis mid-CFX  . Knee arthroscopy Left   . Cardioversion  06/18/2011    Procedure: CARDIOVERSION;  Surgeon: Kerry Hough., MD;  Location: [facility] OR;  Service: Cardiovascular;  Laterality: N/A;  . Tee without cardioversion  07/13/2012    Procedure: TRANSESOPHAGEAL ECHOCARDIOGRAM (TEE);  Surgeon: Lelon Perla, MD;  Location: [facility] ENDOSCOPY;  Service: Cardiovascular;  Laterality: N/A;  . Cardioversion N/A 12/06/2012    Procedure: CARDIOVERSION;  Surgeon: Jacolyn Reedy, MD;  Location: [facility] ENDOSCOPY;  Service: Cardiovascular;  Laterality: N/A;  . Colonoscopy    . Multiple tooth extractions      "top's are all gone; took some off the bottom too"  . Icd generator change  07/2012  . Cardiac defibrillator removal  12/20/2013  . Insert / replace / remove pacemaker  12/20/2013  . Icd lead removal Left 12/20/2013    Procedure: ICD LEAD REMOVAL//EXTRACTION AND PLACEMENT OF TEMP/PERM ATRIAL LEAD;  Surgeon: Evans Lance, MD;  Location: [place];  Service: Cardiovascular;  Laterality: Left;  . Implantable cardioverter defibrillator revision N/A 07/13/2012    Procedure: IMPLANTABLE CARDIOVERTER DEFIBRILLATOR REVISION;  Surgeon: Deboraha Sprang, MD;  Location: [facility] CATH LAB;  Service: Cardiovascular;  Laterality: N/A;  . Implantable cardioverter defibrillator implant N/A 01/25/2014    Procedure: IMPLANTABLE CARDIOVERTER DEFIBRILLATOR IMPLANT;  Surgeon: Evans Lance, MD;  Location: [facility] CATH LAB;  Service: Cardiovascular;  Laterality: N/A;     Family History  Problem Relation Age of Onset  . Heart disease Other     uncle  . Diabetes Other     uncle     History   Social History  . Marital Status: Single    Spouse Name: N/A  .  Number of Children: N/A  . Years of Education: N/A   Occupational History  . disabled    Social History Main Topics  . Smoking status: Never Smoker   . Smokeless tobacco: Never Used  . Alcohol Use: Yes     Comment: 12/20/2013 "might have drank a couple beers before; never really drank"  . Drug Use:  No  . Sexual Activity: No   Other Topics Concern  . Not on file   Social History Narrative   Never married.  Worked previously in [employer].     BP 132/88 mmHg  Pulse 72  Ht 5' 5.5" (1.664 m)  Wt 247 lb 12.8 oz (112.401 kg)  BMI 40.59 kg/m2  Physical Exam:  stable appearing obese, 69 yo man, NAD HEENT: Unremarkable Neck:  No JVD, no thyromegally Back:  No CVA tenderness Lungs:  Clear with no wheezes, with well-healed ICD pocket. HEART:  Regular rate rhythm, no murmurs, no rubs, no clicks Abd:  soft, obese, positive bowel sounds, no organomegally, no rebound, no guarding Ext:  2 plus pulses, 1+ peripheral edema, no cyanosis, no clubbing Skin:  No rashes no nodules Neuro:  CN II through XII intact, motor grossly intact  ECG- Sinus bradycardia with intermittent ventricular pacing   Assess/Plan:

## 2014-08-09 NOTE — Assessment & Plan Note (Signed)
Interrogation of his ICD demonstrates no ventricular tachycardia. He will continue amiodarone 200 mg daily along with his beta blocker.

## 2014-08-09 NOTE — Assessment & Plan Note (Signed)
His blood pressure today is fairly well controlled. He will continue his current medical therapy.

## 2014-08-09 NOTE — Assessment & Plan Note (Signed)
He appears to be maintaining sinus rhythm. He will continue his current medication.

## 2014-08-09 NOTE — Assessment & Plan Note (Signed)
The patient has developed atrial lead dysfunction with elevated impedance and very high pacing threshold. We have discussed the treatment options. The risk and benefits of atrial lead removal and insertion of a new atrial lead have been discussed versus reprogram the device to the VVI mode at a backup pacing rate of 40 bpm. We will proceed with reprogramming for now, and we'll see him back in approximately one month. If he is doing poorly secondary to his underlying sinus node dysfunction, we would consider proceeding with placing a new atrial lead and removed and the old lead. I discussed all issues with this with both the patient and his cousin.

## 2014-08-17 ENCOUNTER — Encounter: Payer: Self-pay | Admitting: Internal Medicine

## 2014-08-22 ENCOUNTER — Telehealth: Payer: Self-pay | Admitting: Cardiology

## 2014-08-22 NOTE — Telephone Encounter (Signed)
Cody Faulkner w/ [employer]tr[employer] called requesting information from 08-09-14 interrogation. Requested information given.

## 2014-09-11 ENCOUNTER — Encounter: Payer: Self-pay | Admitting: Internal Medicine

## 2014-09-11 ENCOUNTER — Ambulatory Visit (INDEPENDENT_AMBULATORY_CARE_PROVIDER_SITE_OTHER): Payer: PPO | Admitting: Internal Medicine

## 2014-09-11 VITALS — BP 110/76 | HR 46 | Ht 65.5 in | Wt 245.2 lb

## 2014-09-11 DIAGNOSIS — I5022 Chronic systolic (congestive) heart failure: Secondary | ICD-10-CM

## 2014-09-11 DIAGNOSIS — I5032 Chronic diastolic (congestive) heart failure: Secondary | ICD-10-CM

## 2014-09-11 DIAGNOSIS — I495 Sick sinus syndrome: Secondary | ICD-10-CM | POA: Diagnosis not present

## 2014-09-11 DIAGNOSIS — I4729 Other ventricular tachycardia: Secondary | ICD-10-CM

## 2014-09-11 DIAGNOSIS — I472 Ventricular tachycardia: Secondary | ICD-10-CM | POA: Diagnosis not present

## 2014-09-11 DIAGNOSIS — I420 Dilated cardiomyopathy: Secondary | ICD-10-CM | POA: Diagnosis not present

## 2014-09-11 DIAGNOSIS — T82110D Breakdown (mechanical) of cardiac electrode, subsequent encounter: Secondary | ICD-10-CM

## 2014-09-11 LAB — MDC_IDC_ENUM_SESS_TYPE_INCLINIC
Battery Voltage: 3.02 V
Brady Statistic AP VP Percent: 0.04 %
Brady Statistic AP VS Percent: 0 %
Brady Statistic AS VP Percent: 9.06 %
Brady Statistic AS VS Percent: 90.9 %
Brady Statistic RV Percent Paced: 9.1 %
Date Time Interrogation Session: 20160405094658
HIGH POWER IMPEDANCE MEASURED VALUE: 73 Ohm
HighPow Impedance: 209 Ohm
Lead Channel Impedance Value: 4047 Ohm
Lead Channel Impedance Value: 494 Ohm
Lead Channel Pacing Threshold Amplitude: 0.625 V
Lead Channel Pacing Threshold Pulse Width: 0.4 ms
Lead Channel Sensing Intrinsic Amplitude: 0.875 mV
Lead Channel Sensing Intrinsic Amplitude: 19.375 mV
Lead Channel Sensing Intrinsic Amplitude: 2 mV
Lead Channel Sensing Intrinsic Amplitude: 21.375 mV
Lead Channel Setting Pacing Amplitude: 2.5 V
Lead Channel Setting Sensing Sensitivity: 0.3 mV
MDC IDC MSMT BATTERY REMAINING LONGEVITY: 105 mo
MDC IDC SET LEADCHNL RV PACING PULSEWIDTH: 0.4 ms
MDC IDC SET ZONE DETECTION INTERVAL: 300 ms
MDC IDC SET ZONE DETECTION INTERVAL: 350 ms
MDC IDC STAT BRADY RA PERCENT PACED: 0.04 %
Zone Setting Detection Interval: 350 ms
Zone Setting Detection Interval: 360 ms

## 2014-09-11 NOTE — Patient Instructions (Addendum)
Remote monitoring is used to monitor your  ICD from home. This monitoring reduces the number of office visits required to check your device to one time per year. It allows [place] to keep an eye on the functioning of your device to ensure it is working properly. You are scheduled for a device check from home on 12/11/2014. You may send your transmission at any time that day. If you have a wireless device, the transmission will be sent automatically. After your physician reviews your transmission, you will receive a postcard with your next transmission date.  Your physician recommends that you schedule a follow-up appointment in: 6 months with Dr.Taylor  Your physician recommends that you continue on your current medications as directed. Please refer to the Current Medication list given to you today.

## 2014-09-11 NOTE — Assessment & Plan Note (Signed)
His VT is stable. Will continue amiodarone

## 2014-09-11 NOTE — Progress Notes (Signed)
HPI [name] returns today for followup. He is a pleasant 69 yo man with VT, chronic systolic heart failure who was found to have a broken atrial lead. He returns today for followup. He has been stable. He denies chest pain, sob, or syncope. No edema. He has lost weight.  Allergies  Allergen Reactions  . Methimazole [Thiamazole] Itching and Rash    Severe rash     Current Outpatient Prescriptions  Medication Sig Dispense Refill  . acetaminophen (TYLENOL) 500 MG tablet Take 500 mg by mouth every 6 (six) hours as needed for moderate pain.     Marland Kitchen albuterol (PROVENTIL HFA;VENTOLIN HFA) 108 (90 BASE) MCG/ACT inhaler Inhale 2 puffs into the lungs every 4 (four) hours as needed for shortness of breath.     . allopurinol (ZYLOPRIM) 100 MG tablet Take 100 mg by mouth daily.     Marland Kitchen amiodarone (PACERONE) 200 MG tablet Take 200 mg by mouth daily.     . bisoprolol (ZEBETA) 10 MG tablet Take 1 tablet (10 mg total) by mouth daily. 30 tablet 1  . budesonide-formoterol (SYMBICORT) 160-4.5 MCG/ACT inhaler Inhale 2 puffs into the lungs 2 (two) times daily.    . Cholecalciferol (VITAMIN D) 2000 UNITS tablet Take 2,000 Units by mouth daily.    Marland Kitchen docusate sodium (COLACE) 100 MG capsule Take 100 mg by mouth daily as needed for mild constipation.    . fenofibrate 160 MG tablet Take 160 mg by mouth daily.    . furosemide (LASIX) 40 MG tablet Take 2 tablets by mouth Monday, Wednesday, and Friday.  Take 1 tablet by mouth all other days 30 tablet 1  . isosorbide mononitrate (IMDUR) 60 MG 24 hr tablet Take 1 tablet (60 mg total) by mouth daily. 30 tablet 1  . levothyroxine (SYNTHROID, LEVOTHROID) 100 MCG tablet Take 1 tablet (100 mcg total) by mouth daily. 30 tablet 4  . loratadine (CLARITIN) 10 MG tablet Take 10 mg by mouth every morning.     . nystatin (MYCOSTATIN) powder Apply 1 g topically 3 (three) times daily as needed (itching).     . potassium chloride SA (K-DUR,KLOR-CON) 20 MEQ tablet Take 20 mEq by  mouth every other day.    . Warfarin Sodium (COUMADIN PO) Take as directed by the coumadin clinic     No current facility-administered medications for this visit.     Past Medical History  Diagnosis Date  . Atrial fibrillation     takes COumadin daily  . Depression   . CAD (coronary artery disease)     predominantly single vessel  . Ventricular tachycardia     s/p defibrillator-Medtronic EnTrust [id number]  . Dilated cardiomyopathy     s/p defibrillator Medtronic EnTrust [phone]  . Solitary kidney 06/05/2006  . Gout     takes Allopurinol daily  . History of hyperthyroidism   . Left knee DJD     needs surgery  . Asthma     Albuterol prn;Symbicort daily  . Constipation     takes Colace daily as needed  . Hyperlipidemia     takes Fenofibrate daily  . Seasonal allergies     takes Claritin daily  . Hypertension     takes Bisoprolol daily as well as Imdur  . Myocardial infarction 2007  . Peripheral edema     takes Furosemide daily  . Urinary incontinence   . Shortness of breath     with exertion  . Dizziness  occasionally  . Joint pain   . Joint swelling   . Bruises easily     d/t being on Coumadin  . GERD (gastroesophageal reflux disease)     takes OTC med if needed  . Urinary frequency   . Urinary urgency   . Cataracts, bilateral to be removed in Aug 2015  . Pacemaker   . Pneumonia, community acquired last time 2014    "get it ~ q yr; haven't had it yet in 2015" (12/20/2013)  . OSA (obstructive sleep apnea)     "don't wear mask; can't afford one" (12/20/2013)  . Hypothyroidism     takes Synthroid daily  . Chronic kidney disease 1976    S/P nephrectomy  . Heart failure     NYHFA     CLASS 3  . Cardiomyopathy, ischemic     ROS:   All systems reviewed and negative except as noted in the HPI.   Past Surgical History  Procedure Laterality Date  . Cardiac defibrillator placement  2007    Medtronic EnTrust [phone]  . Nephrectomy  1976    Right  . Cardiac  catheterization  2007    60% Stenosis mid-CFX  . Knee arthroscopy Left   . Cardioversion  06/18/2011    Procedure: CARDIOVERSION;  Surgeon: Kerry Hough., MD;  Location: [facility] OR;  Service: Cardiovascular;  Laterality: N/A;  . Tee without cardioversion  07/13/2012    Procedure: TRANSESOPHAGEAL ECHOCARDIOGRAM (TEE);  Surgeon: Lelon Perla, MD;  Location: [facility] ENDOSCOPY;  Service: Cardiovascular;  Laterality: N/A;  . Cardioversion N/A 12/06/2012    Procedure: CARDIOVERSION;  Surgeon: Jacolyn Reedy, MD;  Location: [facility] ENDOSCOPY;  Service: Cardiovascular;  Laterality: N/A;  . Colonoscopy    . Multiple tooth extractions      "top's are all gone; took some off the bottom too"  . Icd generator change  07/2012  . Cardiac defibrillator removal  12/20/2013  . Insert / replace / remove pacemaker  12/20/2013  . Icd lead removal Left 12/20/2013    Procedure: ICD LEAD REMOVAL//EXTRACTION AND PLACEMENT OF TEMP/PERM ATRIAL LEAD;  Surgeon: Evans Lance, MD;  Location: [place];  Service: Cardiovascular;  Laterality: Left;  . Implantable cardioverter defibrillator revision N/A 07/13/2012    Procedure: IMPLANTABLE CARDIOVERTER DEFIBRILLATOR REVISION;  Surgeon: Deboraha Sprang, MD;  Location: [facility] CATH LAB;  Service: Cardiovascular;  Laterality: N/A;  . Implantable cardioverter defibrillator implant N/A 01/25/2014    Procedure: IMPLANTABLE CARDIOVERTER DEFIBRILLATOR IMPLANT;  Surgeon: Evans Lance, MD;  Location: [facility] CATH LAB;  Service: Cardiovascular;  Laterality: N/A;     Family History  Problem Relation Age of Onset  . Heart disease Other     uncle  . Diabetes Other     uncle     History   Social History  . Marital Status: Single    Spouse Name: N/A  . Number of Children: N/A  . Years of Education: N/A   Occupational History  . disabled    Social History Main Topics  . Smoking status: Never Smoker   . Smokeless tobacco: Never Used  . Alcohol Use: Yes     Comment: 12/20/2013 "might have drank a  couple beers before; never really drank"  . Drug Use: No  . Sexual Activity: No   Other Topics Concern  . Not on file   Social History Narrative   Never married.  Worked previously in [employer].     BP 110/76 mmHg  Pulse  46  Ht 5' 5.5" (1.664 m)  Wt 245 lb 3.2 oz (111.222 kg)  BMI 40.17 kg/m2  Physical Exam:  stable appearing 69 yo man, NAD HEENT: Unremarkable Neck:  No JVD, no thyromegally Lymphatics:  No adenopathy Back:  No CVA tenderness Lungs:  Clear with no wheezes HEART:  Regular rate rhythm, no murmurs, no rubs, no clicks Abd:  soft, positive bowel sounds, no organomegally, no rebound, no guarding Ext:  2 plus pulses, no edema, no cyanosis, no clubbing Skin:  No rashes no nodules Neuro:  CN II through XII intact, motor grossly intact   DEVICE  Normal device function.  See PaceArt for details.   Assess/Plan:

## 2014-09-11 NOTE — Assessment & Plan Note (Signed)
His atrial lead is not working though his ICD lead is ok. He has been reprogrammed to VVI 40/min. He has V paced 9%. He will undergo watchful waiting as he is not symptomatic.

## 2014-09-11 NOTE — Assessment & Plan Note (Signed)
He is class 2 and well compensated. He will continue his current meds. He will maintain a low sodium diet.

## 2014-09-28 ENCOUNTER — Other Ambulatory Visit: Payer: Self-pay | Admitting: *Deleted

## 2014-09-28 MED ORDER — BISOPROLOL FUMARATE 10 MG PO TABS: 10.0000 mg | ORAL_TABLET | Freq: Every day | ORAL | Status: DC

## 2014-10-05 ENCOUNTER — Other Ambulatory Visit: Payer: Self-pay | Admitting: *Deleted

## 2014-10-05 MED ORDER — FUROSEMIDE 40 MG PO TABS: ORAL_TABLET | ORAL | Status: DC

## 2014-10-05 MED ORDER — ISOSORBIDE MONONITRATE ER 60 MG PO TB24: 60.0000 mg | ORAL_TABLET | Freq: Every day | ORAL | Status: DC

## 2014-12-11 ENCOUNTER — Encounter: Payer: PPO | Admitting: *Deleted

## 2014-12-11 ENCOUNTER — Telehealth: Payer: Self-pay | Admitting: Cardiology

## 2014-12-11 NOTE — Telephone Encounter (Signed)
LMOVM reminding pt to send remote transmission.   

## 2014-12-12 ENCOUNTER — Encounter: Payer: Self-pay | Admitting: Cardiology

## 2015-02-09 ENCOUNTER — Other Ambulatory Visit: Payer: Self-pay | Admitting: Internal Medicine

## 2015-03-20 ENCOUNTER — Encounter: Payer: Self-pay | Admitting: Internal Medicine

## 2015-03-20 ENCOUNTER — Ambulatory Visit (INDEPENDENT_AMBULATORY_CARE_PROVIDER_SITE_OTHER): Payer: PPO | Admitting: Internal Medicine

## 2015-03-20 VITALS — BP 158/88 | HR 61 | Ht 65.0 in | Wt 237.0 lb

## 2015-03-20 DIAGNOSIS — I4891 Unspecified atrial fibrillation: Secondary | ICD-10-CM | POA: Diagnosis not present

## 2015-03-20 DIAGNOSIS — I119 Hypertensive heart disease without heart failure: Secondary | ICD-10-CM

## 2015-03-20 DIAGNOSIS — Z95810 Presence of automatic (implantable) cardiac defibrillator: Secondary | ICD-10-CM | POA: Diagnosis not present

## 2015-03-20 DIAGNOSIS — I480 Paroxysmal atrial fibrillation: Secondary | ICD-10-CM | POA: Diagnosis not present

## 2015-03-20 LAB — CUP PACEART INCLINIC DEVICE CHECK
Battery Voltage: 3.01 V
Brady Statistic AS VP Percent: 1.35 %
Brady Statistic RA Percent Paced: 0 %
Brady Statistic RV Percent Paced: 1.35 %
HIGH POWER IMPEDANCE MEASURED VALUE: 75 Ohm
Implantable Lead Implant Date: 20150820
Implantable Lead Implant Date: 20150820
Implantable Lead Location: 753859
Implantable Lead Model: 6935
Lead Channel Impedance Value: 380 Ohm
Lead Channel Impedance Value: 4047 Ohm
Lead Channel Impedance Value: 437 Ohm
Lead Channel Pacing Threshold Amplitude: 2.375 V
Lead Channel Pacing Threshold Pulse Width: 0.4 ms
Lead Channel Sensing Intrinsic Amplitude: 20.125 mV
MDC IDC LEAD LOCATION: 753860
MDC IDC MSMT BATTERY REMAINING LONGEVITY: 119 mo
MDC IDC MSMT LEADCHNL RA SENSING INTR AMPL: 2.125 mV
MDC IDC MSMT LEADCHNL RV PACING THRESHOLD AMPLITUDE: 0.75 V
MDC IDC MSMT LEADCHNL RV PACING THRESHOLD PULSEWIDTH: 0.4 ms
MDC IDC SESS DTM: 20161012134014
MDC IDC SET LEADCHNL RV PACING AMPLITUDE: 2.5 V
MDC IDC SET LEADCHNL RV PACING PULSEWIDTH: 0.4 ms
MDC IDC SET LEADCHNL RV SENSING SENSITIVITY: 0.3 mV
MDC IDC SET ZONE DETECTION INTERVAL: 350 ms
MDC IDC STAT BRADY AP VP PERCENT: 0 %
MDC IDC STAT BRADY AP VS PERCENT: 0 %
MDC IDC STAT BRADY AS VS PERCENT: 98.65 %
Zone Setting Detection Interval: 300 ms

## 2015-03-20 MED ORDER — POTASSIUM CHLORIDE CRYS ER 20 MEQ PO TBCR: 20.0000 meq | EXTENDED_RELEASE_TABLET | ORAL | Status: DC

## 2015-03-20 MED ORDER — ISOSORBIDE MONONITRATE ER 60 MG PO TB24: 60.0000 mg | ORAL_TABLET | Freq: Every day | ORAL | Status: DC

## 2015-03-20 MED ORDER — APIXABAN 5 MG PO TABS: 5.0000 mg | ORAL_TABLET | Freq: Two times a day (BID) | ORAL | Status: DC

## 2015-03-20 MED ORDER — FUROSEMIDE 40 MG PO TABS: 40.0000 mg | ORAL_TABLET | Freq: Every day | ORAL | Status: DC

## 2015-03-20 MED ORDER — AMIODARONE HCL 200 MG PO TABS: 200.0000 mg | ORAL_TABLET | Freq: Every day | ORAL | Status: DC

## 2015-03-20 MED ORDER — BISOPROLOL FUMARATE 10 MG PO TABS: 10.0000 mg | ORAL_TABLET | Freq: Every day | ORAL | Status: DC

## 2015-03-20 NOTE — Assessment & Plan Note (Signed)
He has lost some weight and is encouraged to increase his activity. Will follow.

## 2015-03-20 NOTE — Patient Instructions (Signed)
Medication Instructions:  Your physician recommends that you continue on your current medications as directed. Please refer to the Current Medication list given to you today.   Labwork: None ordered   Testing/Procedures: None ordered   Follow-Up:  Your physician recommends that you schedule a follow-up appointment in: 3 months in the device clinic  Your physician wants you to follow-up in:  12 months with Dr Knox Saliva will receive a reminder letter in the mail two months in advance. If you don't receive a letter, please call our office to schedule the follow-up appointment.  .    Any Other Special Instructions Will Be Listed Below (If Applicable).

## 2015-03-20 NOTE — Progress Notes (Signed)
HPI [name] returns today for followup. He is a pleasant 69 yo man with VT, chronic systolic heart failure who was found to have a broken atrial lead and his device reprogrammed to VVI. He returns today for followup. He has been stable. He denies chest pain, sob, or syncope. No edema. He has fallen and injured his right shoulder but appears to be improved.  Allergies  Allergen Reactions  . Methimazole [Methimazole] Itching and Rash    Severe rash     Current Outpatient Prescriptions  Medication Sig Dispense Refill  . acetaminophen (TYLENOL) 500 MG tablet Take 500 mg by mouth every 6 (six) hours as needed for moderate pain.     Marland Kitchen albuterol (PROVENTIL HFA;VENTOLIN HFA) 108 (90 BASE) MCG/ACT inhaler Inhale 2 puffs into the lungs every 4 (four) hours as needed for shortness of breath.     . allopurinol (ZYLOPRIM) 100 MG tablet Take 100 mg by mouth daily.     Marland Kitchen amiodarone (PACERONE) 200 MG tablet Take 200 mg by mouth daily.     Marland Kitchen apixaban (ELIQUIS) 5 MG TABS tablet Take 5 mg by mouth 2 (two) times daily.    . budesonide-formoterol (SYMBICORT) 160-4.5 MCG/ACT inhaler Inhale 2 puffs into the lungs 2 (two) times daily.    . Cholecalciferol (VITAMIN D) 2000 UNITS tablet Take 2,000 Units by mouth daily.    . furosemide (LASIX) 40 MG tablet Take one tablet by mouth except Monday, Wednesday and Friday take two tablets by mouth    . isosorbide mononitrate (IMDUR) 60 MG 24 hr tablet Take 1 tablet (60 mg total) by mouth daily. 30 tablet 5  . levothyroxine (SYNTHROID, LEVOTHROID) 100 MCG tablet Take 1 tablet (100 mcg total) by mouth daily. 30 tablet 4  . potassium chloride SA (K-DUR,KLOR-CON) 20 MEQ tablet Take 20 mEq by mouth every other day.    . bisoprolol (ZEBETA) 10 MG tablet Take 1 tablet (10 mg total) by mouth daily. 30 tablet 5  . loratadine (CLARITIN) 10 MG tablet Take 10 mg by mouth every morning.     . nystatin (MYCOSTATIN) powder Apply 1 g topically 3 (three) times daily as needed  (itching).      No current facility-administered medications for this visit.     Past Medical History  Diagnosis Date  . Atrial fibrillation ([place])     takes COumadin daily  . Depression   . CAD (coronary artery disease)     predominantly single vessel  . Ventricular tachycardia ([place])     s/p defibrillator-Medtronic EnTrust [id number]  . Dilated cardiomyopathy ([place])     s/p defibrillator Medtronic EnTrust [phone]  . Solitary kidney 06/05/2006  . Gout     takes Allopurinol daily  . History of hyperthyroidism   . Left knee DJD     needs surgery  . Asthma     Albuterol prn;Symbicort daily  . Constipation     takes Colace daily as needed  . Hyperlipidemia     takes Fenofibrate daily  . Seasonal allergies     takes Claritin daily  . Hypertension     takes Bisoprolol daily as well as Imdur  . Myocardial infarction ([place]) 2007  . Peripheral edema     takes Furosemide daily  . Urinary incontinence   . Shortness of breath     with exertion  . Dizziness     occasionally  . Joint pain   . Joint swelling   . Bruises easily  d/t being on Coumadin  . GERD (gastroesophageal reflux disease)     takes OTC med if needed  . Urinary frequency   . Urinary urgency   . Cataracts, bilateral to be removed in Aug 2015  . Pacemaker   . Pneumonia, community acquired last time 2014    "get it ~ q yr; haven't had it yet in 2015" (12/20/2013)  . OSA (obstructive sleep apnea)     "don't wear mask; can't afford one" (12/20/2013)  . Hypothyroidism     takes Synthroid daily  . Chronic kidney disease 1976    S/P nephrectomy  . Heart failure ([place])     NYHFA     CLASS 3  . Cardiomyopathy, ischemic     ROS:   All systems reviewed and negative except as noted in the HPI.   Past Surgical History  Procedure Laterality Date  . Cardiac defibrillator placement  2007    Medtronic EnTrust [phone]  . Nephrectomy  1976    Right  . Cardiac catheterization  2007    60% Stenosis mid-CFX  . Knee  arthroscopy Left   . Cardioversion  06/18/2011    Procedure: CARDIOVERSION;  Surgeon: Kerry Hough., MD;  Location: [facility] OR;  Service: Cardiovascular;  Laterality: N/A;  . Tee without cardioversion  07/13/2012    Procedure: TRANSESOPHAGEAL ECHOCARDIOGRAM (TEE);  Surgeon: Lelon Perla, MD;  Location: [facility] ENDOSCOPY;  Service: Cardiovascular;  Laterality: N/A;  . Cardioversion N/A 12/06/2012    Procedure: CARDIOVERSION;  Surgeon: Jacolyn Reedy, MD;  Location: [facility] ENDOSCOPY;  Service: Cardiovascular;  Laterality: N/A;  . Colonoscopy    . Multiple tooth extractions      "top's are all gone; took some off the bottom too"  . Icd generator change  07/2012  . Cardiac defibrillator removal  12/20/2013  . Insert / replace / remove pacemaker  12/20/2013  . Icd lead removal Left 12/20/2013    Procedure: ICD LEAD REMOVAL//EXTRACTION AND PLACEMENT OF TEMP/PERM ATRIAL LEAD;  Surgeon: Evans Lance, MD;  Location: [place];  Service: Cardiovascular;  Laterality: Left;  . Implantable cardioverter defibrillator revision N/A 07/13/2012    Procedure: IMPLANTABLE CARDIOVERTER DEFIBRILLATOR REVISION;  Surgeon: Deboraha Sprang, MD;  Location: [facility] CATH LAB;  Service: Cardiovascular;  Laterality: N/A;  . Implantable cardioverter defibrillator implant N/A 01/25/2014    Procedure: IMPLANTABLE CARDIOVERTER DEFIBRILLATOR IMPLANT;  Surgeon: Evans Lance, MD;  Location: [facility] CATH LAB;  Service: Cardiovascular;  Laterality: N/A;     Family History  Problem Relation Age of Onset  . Heart disease Other     uncle  . Diabetes Other     uncle     Social History   Social History  . Marital Status: Single    Spouse Name: N/A  . Number of Children: N/A  . Years of Education: N/A   Occupational History  . disabled    Social History Main Topics  . Smoking status: Never Smoker   . Smokeless tobacco: Never Used  . Alcohol Use: Yes     Comment: 12/20/2013 "might have drank a couple beers before; never really drank"  .  Drug Use: No  . Sexual Activity: No   Other Topics Concern  . Not on file   Social History Narrative   Never married.  Worked previously in [employer].     BP 158/88 mmHg  Pulse 61  Ht 5\' 5"  (1.651 m)  Wt 237 lb (107.502 kg)  BMI 39.44 kg/m2  Physical Exam:  stable appearing 69 yo man, diskempt but in NAD HEENT: Unremarkable Neck:  No JVD, no thyromegally Lymphatics:  No adenopathy Back:  No CVA tenderness Lungs:  Clear with no wheezes HEART:  Regular rate rhythm, no murmurs, no rubs, no clicks Abd:  soft, positive bowel sounds, no organomegally, no rebound, no guarding Ext:  2 plus pulses, no edema, no cyanosis, no clubbing Skin:  No rashes no nodules Neuro:  CN II through XII intact, motor grossly intact   DEVICE  Normal device function.  See PaceArt for details.   Assess/Plan:

## 2015-03-20 NOTE — Assessment & Plan Note (Signed)
His Medtronic device is programmed VVI 40 and is working normally. He has had no shocks.

## 2015-03-20 NOTE — Assessment & Plan Note (Signed)
He is asymptomatic. He will continue Eliquis. Will follow.

## 2015-06-19 ENCOUNTER — Ambulatory Visit (INDEPENDENT_AMBULATORY_CARE_PROVIDER_SITE_OTHER): Payer: PPO | Admitting: *Deleted

## 2015-06-19 ENCOUNTER — Encounter: Payer: Self-pay | Admitting: Internal Medicine

## 2015-06-19 DIAGNOSIS — Z95810 Presence of automatic (implantable) cardiac defibrillator: Secondary | ICD-10-CM

## 2015-06-19 DIAGNOSIS — M25561 Pain in right knee: Secondary | ICD-10-CM | POA: Diagnosis not present

## 2015-06-19 DIAGNOSIS — I5022 Chronic systolic (congestive) heart failure: Secondary | ICD-10-CM | POA: Diagnosis not present

## 2015-06-19 DIAGNOSIS — Z6841 Body Mass Index (BMI) 40.0 and over, adult: Secondary | ICD-10-CM | POA: Diagnosis not present

## 2015-06-19 DIAGNOSIS — I10 Essential (primary) hypertension: Secondary | ICD-10-CM | POA: Diagnosis not present

## 2015-06-19 LAB — CUP PACEART INCLINIC DEVICE CHECK
Battery Remaining Longevity: 117 mo
Brady Statistic AP VP Percent: 0 %
Brady Statistic AP VS Percent: 0 %
Brady Statistic AS VP Percent: 0.03 %
Brady Statistic RA Percent Paced: 0 %
Date Time Interrogation Session: 20170111092520
HighPow Impedance: 80 Ohm
Implantable Lead Implant Date: 20150820
Implantable Lead Implant Date: 20150820
Implantable Lead Location: 753859
Implantable Lead Location: 753860
Implantable Lead Model: 5076
Lead Channel Impedance Value: 4047 Ohm
Lead Channel Pacing Threshold Amplitude: 0.75 V
Lead Channel Sensing Intrinsic Amplitude: 19.125 mV
Lead Channel Sensing Intrinsic Amplitude: 2 mV
Lead Channel Setting Pacing Amplitude: 2.5 V
Lead Channel Setting Pacing Pulse Width: 0.4 ms
Lead Channel Setting Sensing Sensitivity: 0.3 mV
MDC IDC LEAD MODEL: 6935
MDC IDC MSMT BATTERY VOLTAGE: 3.01 V
MDC IDC MSMT LEADCHNL RV IMPEDANCE VALUE: 399 Ohm
MDC IDC MSMT LEADCHNL RV IMPEDANCE VALUE: 456 Ohm
MDC IDC MSMT LEADCHNL RV PACING THRESHOLD PULSEWIDTH: 0.4 ms
MDC IDC STAT BRADY AS VS PERCENT: 99.97 %
MDC IDC STAT BRADY RV PERCENT PACED: 0.03 %

## 2015-06-19 NOTE — Progress Notes (Signed)
ICD check in clinic. Normal device function. Threshold and sensing consistent with previous device measurements. Impedance trends stable over time. No evidence of any ventricular arrhythmias. Histogram distribution appropriate for patient and level of activity. No changes made this session. Device programmed at appropriate safety margins. Device programmed to optimize intrinsic conduction. Estimated longevity 9 years. ROV with device clinic 09/18/15 and with [facility] in October.

## 2015-06-27 DIAGNOSIS — I10 Essential (primary) hypertension: Secondary | ICD-10-CM | POA: Diagnosis not present

## 2015-06-27 DIAGNOSIS — Z6841 Body Mass Index (BMI) 40.0 and over, adult: Secondary | ICD-10-CM | POA: Diagnosis not present

## 2015-06-27 DIAGNOSIS — R600 Localized edema: Secondary | ICD-10-CM | POA: Diagnosis not present

## 2015-07-01 DIAGNOSIS — Z6841 Body Mass Index (BMI) 40.0 and over, adult: Secondary | ICD-10-CM | POA: Diagnosis not present

## 2015-07-01 DIAGNOSIS — I10 Essential (primary) hypertension: Secondary | ICD-10-CM | POA: Diagnosis not present

## 2015-07-01 DIAGNOSIS — S40021A Contusion of right upper arm, initial encounter: Secondary | ICD-10-CM | POA: Diagnosis not present

## 2015-07-04 DIAGNOSIS — S40021A Contusion of right upper arm, initial encounter: Secondary | ICD-10-CM | POA: Diagnosis not present

## 2015-07-04 DIAGNOSIS — Z6841 Body Mass Index (BMI) 40.0 and over, adult: Secondary | ICD-10-CM | POA: Diagnosis not present

## 2015-07-04 DIAGNOSIS — L03119 Cellulitis of unspecified part of limb: Secondary | ICD-10-CM | POA: Diagnosis not present

## 2015-07-04 DIAGNOSIS — R531 Weakness: Secondary | ICD-10-CM | POA: Diagnosis not present

## 2015-07-04 DIAGNOSIS — E039 Hypothyroidism, unspecified: Secondary | ICD-10-CM | POA: Diagnosis not present

## 2015-07-08 DIAGNOSIS — I10 Essential (primary) hypertension: Secondary | ICD-10-CM | POA: Diagnosis not present

## 2015-07-08 DIAGNOSIS — Z6841 Body Mass Index (BMI) 40.0 and over, adult: Secondary | ICD-10-CM | POA: Diagnosis not present

## 2015-07-08 DIAGNOSIS — L03119 Cellulitis of unspecified part of limb: Secondary | ICD-10-CM | POA: Diagnosis not present

## 2015-07-08 DIAGNOSIS — S40021D Contusion of right upper arm, subsequent encounter: Secondary | ICD-10-CM | POA: Diagnosis not present

## 2015-07-11 DIAGNOSIS — B354 Tinea corporis: Secondary | ICD-10-CM | POA: Diagnosis not present

## 2015-07-11 DIAGNOSIS — B353 Tinea pedis: Secondary | ICD-10-CM | POA: Diagnosis not present

## 2015-07-11 DIAGNOSIS — R600 Localized edema: Secondary | ICD-10-CM | POA: Diagnosis not present

## 2015-07-11 DIAGNOSIS — B351 Tinea unguium: Secondary | ICD-10-CM | POA: Diagnosis not present

## 2015-07-25 DIAGNOSIS — I8310 Varicose veins of unspecified lower extremity with inflammation: Secondary | ICD-10-CM | POA: Diagnosis not present

## 2015-08-12 DIAGNOSIS — I2510 Atherosclerotic heart disease of native coronary artery without angina pectoris: Secondary | ICD-10-CM | POA: Diagnosis not present

## 2015-08-12 DIAGNOSIS — I472 Ventricular tachycardia: Secondary | ICD-10-CM | POA: Diagnosis not present

## 2015-08-12 DIAGNOSIS — I5032 Chronic diastolic (congestive) heart failure: Secondary | ICD-10-CM | POA: Diagnosis not present

## 2015-08-12 DIAGNOSIS — Z7901 Long term (current) use of anticoagulants: Secondary | ICD-10-CM | POA: Diagnosis not present

## 2015-08-12 DIAGNOSIS — J45998 Other asthma: Secondary | ICD-10-CM | POA: Diagnosis not present

## 2015-08-12 DIAGNOSIS — N183 Chronic kidney disease, stage 3 (moderate): Secondary | ICD-10-CM | POA: Diagnosis not present

## 2015-08-12 DIAGNOSIS — G4733 Obstructive sleep apnea (adult) (pediatric): Secondary | ICD-10-CM | POA: Diagnosis not present

## 2015-08-12 DIAGNOSIS — I480 Paroxysmal atrial fibrillation: Secondary | ICD-10-CM | POA: Diagnosis not present

## 2015-08-12 DIAGNOSIS — Z95810 Presence of automatic (implantable) cardiac defibrillator: Secondary | ICD-10-CM | POA: Diagnosis not present

## 2015-08-12 DIAGNOSIS — I119 Hypertensive heart disease without heart failure: Secondary | ICD-10-CM | POA: Diagnosis not present

## 2015-08-12 DIAGNOSIS — E039 Hypothyroidism, unspecified: Secondary | ICD-10-CM | POA: Diagnosis not present

## 2015-08-14 DIAGNOSIS — M7021 Olecranon bursitis, right elbow: Secondary | ICD-10-CM | POA: Diagnosis not present

## 2015-08-14 DIAGNOSIS — N529 Male erectile dysfunction, unspecified: Secondary | ICD-10-CM | POA: Diagnosis not present

## 2015-08-14 DIAGNOSIS — E291 Testicular hypofunction: Secondary | ICD-10-CM | POA: Diagnosis not present

## 2015-08-14 DIAGNOSIS — Z6841 Body Mass Index (BMI) 40.0 and over, adult: Secondary | ICD-10-CM | POA: Diagnosis not present

## 2015-08-20 DIAGNOSIS — J45998 Other asthma: Secondary | ICD-10-CM | POA: Diagnosis not present

## 2015-08-20 DIAGNOSIS — I2510 Atherosclerotic heart disease of native coronary artery without angina pectoris: Secondary | ICD-10-CM | POA: Diagnosis not present

## 2015-08-20 DIAGNOSIS — G4733 Obstructive sleep apnea (adult) (pediatric): Secondary | ICD-10-CM | POA: Diagnosis not present

## 2015-08-20 DIAGNOSIS — N183 Chronic kidney disease, stage 3 (moderate): Secondary | ICD-10-CM | POA: Diagnosis not present

## 2015-08-20 DIAGNOSIS — Z7901 Long term (current) use of anticoagulants: Secondary | ICD-10-CM | POA: Diagnosis not present

## 2015-08-20 DIAGNOSIS — E038 Other specified hypothyroidism: Secondary | ICD-10-CM | POA: Diagnosis not present

## 2015-08-20 DIAGNOSIS — I472 Ventricular tachycardia: Secondary | ICD-10-CM | POA: Diagnosis not present

## 2015-08-20 DIAGNOSIS — E039 Hypothyroidism, unspecified: Secondary | ICD-10-CM | POA: Diagnosis not present

## 2015-08-20 DIAGNOSIS — I480 Paroxysmal atrial fibrillation: Secondary | ICD-10-CM | POA: Diagnosis not present

## 2015-08-20 DIAGNOSIS — I5032 Chronic diastolic (congestive) heart failure: Secondary | ICD-10-CM | POA: Diagnosis not present

## 2015-08-20 DIAGNOSIS — I119 Hypertensive heart disease without heart failure: Secondary | ICD-10-CM | POA: Diagnosis not present

## 2015-08-20 DIAGNOSIS — Z95810 Presence of automatic (implantable) cardiac defibrillator: Secondary | ICD-10-CM | POA: Diagnosis not present

## 2015-09-17 DIAGNOSIS — I5030 Unspecified diastolic (congestive) heart failure: Secondary | ICD-10-CM | POA: Diagnosis not present

## 2015-09-17 DIAGNOSIS — H9192 Unspecified hearing loss, left ear: Secondary | ICD-10-CM | POA: Diagnosis not present

## 2015-09-17 DIAGNOSIS — S00411A Abrasion of right ear, initial encounter: Secondary | ICD-10-CM | POA: Diagnosis not present

## 2015-09-17 DIAGNOSIS — I10 Essential (primary) hypertension: Secondary | ICD-10-CM | POA: Diagnosis not present

## 2015-09-18 ENCOUNTER — Encounter: Payer: Self-pay | Admitting: Internal Medicine

## 2015-09-18 ENCOUNTER — Ambulatory Visit (INDEPENDENT_AMBULATORY_CARE_PROVIDER_SITE_OTHER): Payer: PPO | Admitting: *Deleted

## 2015-09-18 DIAGNOSIS — Z95810 Presence of automatic (implantable) cardiac defibrillator: Secondary | ICD-10-CM

## 2015-09-18 LAB — CUP PACEART INCLINIC DEVICE CHECK
Battery Remaining Longevity: 115 mo
Battery Voltage: 3.01 V
Brady Statistic AP VS Percent: 0 %
Brady Statistic AS VP Percent: 0.03 %
Date Time Interrogation Session: 20170412094000
HighPow Impedance: 69 Ohm
Implantable Lead Implant Date: 20150820
Implantable Lead Location: 753859
Implantable Lead Model: 6935
Lead Channel Impedance Value: 4047 Ohm
Lead Channel Impedance Value: 456 Ohm
Lead Channel Pacing Threshold Amplitude: 0.75 V
Lead Channel Sensing Intrinsic Amplitude: 16.875 mV
Lead Channel Sensing Intrinsic Amplitude: 2.125 mV
Lead Channel Setting Pacing Amplitude: 2.5 V
Lead Channel Setting Pacing Pulse Width: 0.4 ms
Lead Channel Setting Sensing Sensitivity: 0.3 mV
MDC IDC LEAD IMPLANT DT: 20150820
MDC IDC LEAD LOCATION: 753860
MDC IDC MSMT LEADCHNL RV IMPEDANCE VALUE: 342 Ohm
MDC IDC MSMT LEADCHNL RV PACING THRESHOLD PULSEWIDTH: 0.4 ms
MDC IDC STAT BRADY AP VP PERCENT: 0 %
MDC IDC STAT BRADY AS VS PERCENT: 99.97 %
MDC IDC STAT BRADY RA PERCENT PACED: 0 %
MDC IDC STAT BRADY RV PERCENT PACED: 0.03 %

## 2015-09-18 NOTE — Progress Notes (Signed)
ICD check in clinic. Normal device function. Thresholds and sensing consistent with previous device measurements. Impedance trends stable over time. No evidence of any ventricular arrhythmias. Histogram distribution appropriate for patient and level of activity. Optivol abnormal since 08/10/15, managed by Dr. Wynonia Lawman. No changes made this session. Device programmed at appropriate safety margins. Device programmed to optimize intrinsic conduction. Estimated longevity 8.53yrs. ROV with Device Clinic 7/12 and [facility] 03/19/16  Patient education completed including shock plan. Alert tones demonstrated for patient.

## 2015-10-10 DIAGNOSIS — M109 Gout, unspecified: Secondary | ICD-10-CM | POA: Diagnosis not present

## 2015-10-10 DIAGNOSIS — E7800 Pure hypercholesterolemia, unspecified: Secondary | ICD-10-CM | POA: Diagnosis not present

## 2015-10-10 DIAGNOSIS — N183 Chronic kidney disease, stage 3 (moderate): Secondary | ICD-10-CM | POA: Diagnosis not present

## 2015-10-15 DIAGNOSIS — E7800 Pure hypercholesterolemia, unspecified: Secondary | ICD-10-CM | POA: Diagnosis not present

## 2015-10-15 DIAGNOSIS — Z7901 Long term (current) use of anticoagulants: Secondary | ICD-10-CM | POA: Diagnosis not present

## 2015-10-15 DIAGNOSIS — N183 Chronic kidney disease, stage 3 (moderate): Secondary | ICD-10-CM | POA: Diagnosis not present

## 2015-10-15 DIAGNOSIS — Z6841 Body Mass Index (BMI) 40.0 and over, adult: Secondary | ICD-10-CM | POA: Diagnosis not present

## 2015-10-15 DIAGNOSIS — I10 Essential (primary) hypertension: Secondary | ICD-10-CM | POA: Diagnosis not present

## 2015-10-15 DIAGNOSIS — I4891 Unspecified atrial fibrillation: Secondary | ICD-10-CM | POA: Diagnosis not present

## 2015-10-15 DIAGNOSIS — M109 Gout, unspecified: Secondary | ICD-10-CM | POA: Diagnosis not present

## 2015-10-29 ENCOUNTER — Other Ambulatory Visit: Payer: Self-pay

## 2015-10-29 DIAGNOSIS — I119 Hypertensive heart disease without heart failure: Secondary | ICD-10-CM

## 2015-10-29 MED ORDER — ISOSORBIDE MONONITRATE ER 60 MG PO TB24: 60.0000 mg | ORAL_TABLET | Freq: Every day | ORAL | Status: DC

## 2015-10-29 NOTE — Telephone Encounter (Signed)
Ok to fill not due to see MD until 03/2016

## 2015-11-12 DIAGNOSIS — Z7901 Long term (current) use of anticoagulants: Secondary | ICD-10-CM | POA: Diagnosis not present

## 2015-11-12 DIAGNOSIS — Z95810 Presence of automatic (implantable) cardiac defibrillator: Secondary | ICD-10-CM | POA: Diagnosis not present

## 2015-11-12 DIAGNOSIS — G4733 Obstructive sleep apnea (adult) (pediatric): Secondary | ICD-10-CM | POA: Diagnosis not present

## 2015-11-12 DIAGNOSIS — J45998 Other asthma: Secondary | ICD-10-CM | POA: Diagnosis not present

## 2015-11-12 DIAGNOSIS — I2510 Atherosclerotic heart disease of native coronary artery without angina pectoris: Secondary | ICD-10-CM | POA: Diagnosis not present

## 2015-11-12 DIAGNOSIS — I480 Paroxysmal atrial fibrillation: Secondary | ICD-10-CM | POA: Diagnosis not present

## 2015-11-12 DIAGNOSIS — E039 Hypothyroidism, unspecified: Secondary | ICD-10-CM | POA: Diagnosis not present

## 2015-11-12 DIAGNOSIS — I472 Ventricular tachycardia: Secondary | ICD-10-CM | POA: Diagnosis not present

## 2015-11-12 DIAGNOSIS — I119 Hypertensive heart disease without heart failure: Secondary | ICD-10-CM | POA: Diagnosis not present

## 2015-11-12 DIAGNOSIS — N183 Chronic kidney disease, stage 3 (moderate): Secondary | ICD-10-CM | POA: Diagnosis not present

## 2015-11-12 DIAGNOSIS — I5032 Chronic diastolic (congestive) heart failure: Secondary | ICD-10-CM | POA: Diagnosis not present

## 2015-11-20 DIAGNOSIS — I480 Paroxysmal atrial fibrillation: Secondary | ICD-10-CM | POA: Diagnosis not present

## 2015-11-20 DIAGNOSIS — E038 Other specified hypothyroidism: Secondary | ICD-10-CM | POA: Diagnosis not present

## 2015-11-22 DIAGNOSIS — M545 Low back pain: Secondary | ICD-10-CM | POA: Diagnosis not present

## 2015-12-18 ENCOUNTER — Encounter: Payer: Self-pay | Admitting: Internal Medicine

## 2015-12-18 ENCOUNTER — Ambulatory Visit (INDEPENDENT_AMBULATORY_CARE_PROVIDER_SITE_OTHER): Payer: PPO | Admitting: *Deleted

## 2015-12-18 DIAGNOSIS — I5022 Chronic systolic (congestive) heart failure: Secondary | ICD-10-CM

## 2015-12-18 DIAGNOSIS — I420 Dilated cardiomyopathy: Secondary | ICD-10-CM

## 2015-12-18 LAB — CUP PACEART INCLINIC DEVICE CHECK
Battery Remaining Longevity: 112 mo
Brady Statistic AP VP Percent: 0 %
Brady Statistic RV Percent Paced: 0.02 %
Date Time Interrogation Session: 20170712093530
HIGH POWER IMPEDANCE MEASURED VALUE: 69 Ohm
Implantable Lead Location: 753859
Implantable Lead Location: 753860
Implantable Lead Model: 6935
Lead Channel Impedance Value: 342 Ohm
Lead Channel Impedance Value: 4047 Ohm
Lead Channel Pacing Threshold Amplitude: 2.375 V
Lead Channel Pacing Threshold Pulse Width: 0.4 ms
Lead Channel Sensing Intrinsic Amplitude: 15.25 mV
Lead Channel Sensing Intrinsic Amplitude: 19.75 mV
Lead Channel Sensing Intrinsic Amplitude: 2.125 mV
Lead Channel Setting Pacing Amplitude: 2.5 V
Lead Channel Setting Pacing Pulse Width: 0.4 ms
Lead Channel Setting Sensing Sensitivity: 0.3 mV
MDC IDC LEAD IMPLANT DT: 20150820
MDC IDC LEAD IMPLANT DT: 20150820
MDC IDC MSMT BATTERY VOLTAGE: 3 V
MDC IDC MSMT LEADCHNL RA PACING THRESHOLD PULSEWIDTH: 0.4 ms
MDC IDC MSMT LEADCHNL RA SENSING INTR AMPL: 1 mV
MDC IDC MSMT LEADCHNL RV IMPEDANCE VALUE: 456 Ohm
MDC IDC MSMT LEADCHNL RV PACING THRESHOLD AMPLITUDE: 0.75 V
MDC IDC STAT BRADY AP VS PERCENT: 0 %
MDC IDC STAT BRADY AS VP PERCENT: 0.02 %
MDC IDC STAT BRADY AS VS PERCENT: 99.98 %
MDC IDC STAT BRADY RA PERCENT PACED: 0 %

## 2015-12-18 NOTE — Progress Notes (Signed)
ICD check in clinic. Normal device function. Threshold and sensing consistent with previous device measurements. Impedance trends stable over time. No evidence of any ventricular arrhythmias. No AT/AF episodes. Histogram distribution appropriate for patient and level of activity. Stable thoracic impedance. No changes made this session. Device programmed at appropriate safety margins. Device programmed to optimize intrinsic conduction. Estimated longevity 9.3 years. Pt will follow up with the [facility] in 3 months and with [facility] in 06-2016. Patient education completed including shock plan. Alert tones demonstrated for patient.

## 2016-01-22 DIAGNOSIS — L578 Other skin changes due to chronic exposure to nonionizing radiation: Secondary | ICD-10-CM | POA: Diagnosis not present

## 2016-01-22 DIAGNOSIS — R233 Spontaneous ecchymoses: Secondary | ICD-10-CM | POA: Diagnosis not present

## 2016-01-22 DIAGNOSIS — L300 Nummular dermatitis: Secondary | ICD-10-CM | POA: Diagnosis not present

## 2016-02-11 DIAGNOSIS — I5032 Chronic diastolic (congestive) heart failure: Secondary | ICD-10-CM | POA: Diagnosis not present

## 2016-02-11 DIAGNOSIS — I480 Paroxysmal atrial fibrillation: Secondary | ICD-10-CM | POA: Diagnosis not present

## 2016-02-11 DIAGNOSIS — G4733 Obstructive sleep apnea (adult) (pediatric): Secondary | ICD-10-CM | POA: Diagnosis not present

## 2016-02-11 DIAGNOSIS — I119 Hypertensive heart disease without heart failure: Secondary | ICD-10-CM | POA: Diagnosis not present

## 2016-02-11 DIAGNOSIS — I2510 Atherosclerotic heart disease of native coronary artery without angina pectoris: Secondary | ICD-10-CM | POA: Diagnosis not present

## 2016-02-11 DIAGNOSIS — Z95810 Presence of automatic (implantable) cardiac defibrillator: Secondary | ICD-10-CM | POA: Diagnosis not present

## 2016-02-11 DIAGNOSIS — I472 Ventricular tachycardia: Secondary | ICD-10-CM | POA: Diagnosis not present

## 2016-02-11 DIAGNOSIS — N183 Chronic kidney disease, stage 3 (moderate): Secondary | ICD-10-CM | POA: Diagnosis not present

## 2016-02-11 DIAGNOSIS — E039 Hypothyroidism, unspecified: Secondary | ICD-10-CM | POA: Diagnosis not present

## 2016-02-11 DIAGNOSIS — Z7901 Long term (current) use of anticoagulants: Secondary | ICD-10-CM | POA: Diagnosis not present

## 2016-02-11 DIAGNOSIS — J45998 Other asthma: Secondary | ICD-10-CM | POA: Diagnosis not present

## 2016-02-12 DIAGNOSIS — Z6841 Body Mass Index (BMI) 40.0 and over, adult: Secondary | ICD-10-CM | POA: Diagnosis not present

## 2016-02-12 DIAGNOSIS — R0981 Nasal congestion: Secondary | ICD-10-CM | POA: Diagnosis not present

## 2016-02-12 DIAGNOSIS — H9319 Tinnitus, unspecified ear: Secondary | ICD-10-CM | POA: Diagnosis not present

## 2016-02-24 ENCOUNTER — Encounter: Payer: Self-pay | Admitting: Cardiology

## 2016-02-24 ENCOUNTER — Other Ambulatory Visit: Payer: Self-pay | Admitting: Cardiology

## 2016-02-24 ENCOUNTER — Ambulatory Visit
Admission: RE | Admit: 2016-02-24 | Discharge: 2016-02-24 | Disposition: A | Discharge status: Home / Self Care | Payer: PPO | Source: Ambulatory Visit | Attending: Cardiology | Admitting: Cardiology

## 2016-02-24 DIAGNOSIS — I119 Hypertensive heart disease without heart failure: Secondary | ICD-10-CM | POA: Diagnosis not present

## 2016-02-24 DIAGNOSIS — I5032 Chronic diastolic (congestive) heart failure: Secondary | ICD-10-CM | POA: Diagnosis not present

## 2016-02-24 DIAGNOSIS — G4733 Obstructive sleep apnea (adult) (pediatric): Secondary | ICD-10-CM | POA: Diagnosis not present

## 2016-02-24 DIAGNOSIS — Z95810 Presence of automatic (implantable) cardiac defibrillator: Secondary | ICD-10-CM | POA: Diagnosis not present

## 2016-02-24 DIAGNOSIS — I472 Ventricular tachycardia: Secondary | ICD-10-CM | POA: Diagnosis not present

## 2016-02-24 DIAGNOSIS — I2510 Atherosclerotic heart disease of native coronary artery without angina pectoris: Secondary | ICD-10-CM | POA: Diagnosis not present

## 2016-02-24 DIAGNOSIS — I480 Paroxysmal atrial fibrillation: Secondary | ICD-10-CM

## 2016-02-24 DIAGNOSIS — R600 Localized edema: Secondary | ICD-10-CM | POA: Diagnosis not present

## 2016-02-24 DIAGNOSIS — E039 Hypothyroidism, unspecified: Secondary | ICD-10-CM | POA: Diagnosis not present

## 2016-02-24 DIAGNOSIS — J45998 Other asthma: Secondary | ICD-10-CM | POA: Diagnosis not present

## 2016-02-24 DIAGNOSIS — N183 Chronic kidney disease, stage 3 (moderate): Secondary | ICD-10-CM | POA: Diagnosis not present

## 2016-02-24 DIAGNOSIS — E038 Other specified hypothyroidism: Secondary | ICD-10-CM | POA: Diagnosis not present

## 2016-02-24 DIAGNOSIS — R05 Cough: Secondary | ICD-10-CM | POA: Diagnosis not present

## 2016-02-24 DIAGNOSIS — Z7901 Long term (current) use of anticoagulants: Secondary | ICD-10-CM | POA: Diagnosis not present

## 2016-03-11 DIAGNOSIS — Z23 Encounter for immunization: Secondary | ICD-10-CM | POA: Diagnosis not present

## 2016-03-23 ENCOUNTER — Ambulatory Visit (INDEPENDENT_AMBULATORY_CARE_PROVIDER_SITE_OTHER): Payer: PPO | Admitting: *Deleted

## 2016-03-23 DIAGNOSIS — I420 Dilated cardiomyopathy: Secondary | ICD-10-CM | POA: Diagnosis not present

## 2016-03-23 LAB — CUP PACEART INCLINIC DEVICE CHECK
Battery Voltage: 3 V
Brady Statistic AP VS Percent: 0 %
Brady Statistic AS VP Percent: 0.05 %
Brady Statistic RA Percent Paced: 0 %
Brady Statistic RV Percent Paced: 0.05 %
HIGH POWER IMPEDANCE MEASURED VALUE: 74 Ohm
Implantable Lead Implant Date: 20150820
Implantable Lead Location: 753859
Implantable Lead Location: 753860
Implantable Lead Model: 5076
Implantable Lead Model: 6935
Lead Channel Impedance Value: 380 Ohm
Lead Channel Impedance Value: 4047 Ohm
Lead Channel Impedance Value: 456 Ohm
Lead Channel Pacing Threshold Amplitude: 0.75 V
Lead Channel Pacing Threshold Pulse Width: 0.4 ms
Lead Channel Sensing Intrinsic Amplitude: 1.875 mV
Lead Channel Sensing Intrinsic Amplitude: 17.5 mV
Lead Channel Setting Pacing Pulse Width: 0.4 ms
MDC IDC LEAD IMPLANT DT: 20150820
MDC IDC MSMT BATTERY REMAINING LONGEVITY: 107 mo
MDC IDC SESS DTM: 20171016092248
MDC IDC SET LEADCHNL RV PACING AMPLITUDE: 2.5 V
MDC IDC SET LEADCHNL RV SENSING SENSITIVITY: 0.3 mV
MDC IDC STAT BRADY AP VP PERCENT: 0 %
MDC IDC STAT BRADY AS VS PERCENT: 99.95 %

## 2016-03-23 NOTE — Patient Instructions (Signed)
ICD check in clinic. Normal device function. Thresholds and sensing consistent with previous device measurements. Impedance trends stable over time-chronic atrial lead impedance high. No evidence of any ventricular arrhythmias. No mode switches. Histogram distribution appropriate for patient and level of activity. No changes made this session. Device programmed at appropriate safety margins. Device programmed to optimize intrinsic conduction. Estimated longevity 8.87yrs. Pt does not do carelink -- follow up every 71mo. ROV with [facility] 1/16. Patient education completed including shock plan. Alert tone demonstrated for patient and education complete on importance of identifying and contacting device clinic.

## 2016-03-24 ENCOUNTER — Other Ambulatory Visit: Payer: Self-pay | Admitting: Internal Medicine

## 2016-03-24 DIAGNOSIS — Z95810 Presence of automatic (implantable) cardiac defibrillator: Secondary | ICD-10-CM

## 2016-03-27 ENCOUNTER — Other Ambulatory Visit: Payer: Self-pay | Admitting: *Deleted

## 2016-03-30 ENCOUNTER — Other Ambulatory Visit: Payer: Self-pay | Admitting: *Deleted

## 2016-03-30 DIAGNOSIS — I480 Paroxysmal atrial fibrillation: Secondary | ICD-10-CM

## 2016-03-30 MED ORDER — FUROSEMIDE 40 MG PO TABS: 40.0000 mg | ORAL_TABLET | Freq: Every day | ORAL | 3 refills | Status: DC

## 2016-03-30 MED ORDER — APIXABAN 5 MG PO TABS: 5.0000 mg | ORAL_TABLET | Freq: Two times a day (BID) | ORAL | 0 refills | Status: DC

## 2016-04-03 ENCOUNTER — Other Ambulatory Visit: Payer: Self-pay | Admitting: Internal Medicine

## 2016-04-03 DIAGNOSIS — I119 Hypertensive heart disease without heart failure: Secondary | ICD-10-CM

## 2016-04-03 MED ORDER — ISOSORBIDE MONONITRATE ER 60 MG PO TB24: 60.0000 mg | ORAL_TABLET | Freq: Every day | ORAL | 2 refills | Status: DC

## 2016-04-08 NOTE — Telephone Encounter (Signed)
Opened in error

## 2016-04-14 DIAGNOSIS — Z125 Encounter for screening for malignant neoplasm of prostate: Secondary | ICD-10-CM | POA: Diagnosis not present

## 2016-04-14 DIAGNOSIS — Z79899 Other long term (current) drug therapy: Secondary | ICD-10-CM | POA: Diagnosis not present

## 2016-04-14 DIAGNOSIS — E7800 Pure hypercholesterolemia, unspecified: Secondary | ICD-10-CM | POA: Diagnosis not present

## 2016-04-16 DIAGNOSIS — Z6841 Body Mass Index (BMI) 40.0 and over, adult: Secondary | ICD-10-CM | POA: Diagnosis not present

## 2016-04-16 DIAGNOSIS — Z1389 Encounter for screening for other disorder: Secondary | ICD-10-CM | POA: Diagnosis not present

## 2016-04-16 DIAGNOSIS — I10 Essential (primary) hypertension: Secondary | ICD-10-CM | POA: Diagnosis not present

## 2016-04-16 DIAGNOSIS — E7800 Pure hypercholesterolemia, unspecified: Secondary | ICD-10-CM | POA: Diagnosis not present

## 2016-04-16 DIAGNOSIS — I4891 Unspecified atrial fibrillation: Secondary | ICD-10-CM | POA: Diagnosis not present

## 2016-04-16 DIAGNOSIS — J45909 Unspecified asthma, uncomplicated: Secondary | ICD-10-CM | POA: Diagnosis not present

## 2016-04-16 DIAGNOSIS — N529 Male erectile dysfunction, unspecified: Secondary | ICD-10-CM | POA: Diagnosis not present

## 2016-04-16 DIAGNOSIS — Z9181 History of falling: Secondary | ICD-10-CM | POA: Diagnosis not present

## 2016-04-16 DIAGNOSIS — L299 Pruritus, unspecified: Secondary | ICD-10-CM | POA: Diagnosis not present

## 2016-04-16 DIAGNOSIS — I5030 Unspecified diastolic (congestive) heart failure: Secondary | ICD-10-CM | POA: Diagnosis not present

## 2016-05-01 ENCOUNTER — Other Ambulatory Visit: Payer: Self-pay | Admitting: Internal Medicine

## 2016-05-01 DIAGNOSIS — I480 Paroxysmal atrial fibrillation: Secondary | ICD-10-CM

## 2016-05-12 DIAGNOSIS — I472 Ventricular tachycardia: Secondary | ICD-10-CM | POA: Diagnosis not present

## 2016-05-12 DIAGNOSIS — J45998 Other asthma: Secondary | ICD-10-CM | POA: Diagnosis not present

## 2016-05-12 DIAGNOSIS — G4733 Obstructive sleep apnea (adult) (pediatric): Secondary | ICD-10-CM | POA: Diagnosis not present

## 2016-05-12 DIAGNOSIS — N183 Chronic kidney disease, stage 3 (moderate): Secondary | ICD-10-CM | POA: Diagnosis not present

## 2016-05-12 DIAGNOSIS — I480 Paroxysmal atrial fibrillation: Secondary | ICD-10-CM | POA: Diagnosis not present

## 2016-05-12 DIAGNOSIS — E039 Hypothyroidism, unspecified: Secondary | ICD-10-CM | POA: Diagnosis not present

## 2016-05-12 DIAGNOSIS — I2510 Atherosclerotic heart disease of native coronary artery without angina pectoris: Secondary | ICD-10-CM | POA: Diagnosis not present

## 2016-05-12 DIAGNOSIS — Z7901 Long term (current) use of anticoagulants: Secondary | ICD-10-CM | POA: Diagnosis not present

## 2016-05-12 DIAGNOSIS — Z95810 Presence of automatic (implantable) cardiac defibrillator: Secondary | ICD-10-CM | POA: Diagnosis not present

## 2016-05-12 DIAGNOSIS — I5032 Chronic diastolic (congestive) heart failure: Secondary | ICD-10-CM | POA: Diagnosis not present

## 2016-05-12 DIAGNOSIS — I119 Hypertensive heart disease without heart failure: Secondary | ICD-10-CM | POA: Diagnosis not present

## 2016-05-21 DIAGNOSIS — I480 Paroxysmal atrial fibrillation: Secondary | ICD-10-CM | POA: Diagnosis not present

## 2016-05-21 DIAGNOSIS — E038 Other specified hypothyroidism: Secondary | ICD-10-CM | POA: Diagnosis not present

## 2016-06-23 ENCOUNTER — Ambulatory Visit (INDEPENDENT_AMBULATORY_CARE_PROVIDER_SITE_OTHER): Payer: PPO | Admitting: Internal Medicine

## 2016-06-23 ENCOUNTER — Encounter: Payer: Self-pay | Admitting: Internal Medicine

## 2016-06-23 VITALS — BP 132/80 | HR 55 | Ht 65.0 in | Wt 229.0 lb

## 2016-06-23 DIAGNOSIS — I420 Dilated cardiomyopathy: Secondary | ICD-10-CM

## 2016-06-23 DIAGNOSIS — I5022 Chronic systolic (congestive) heart failure: Secondary | ICD-10-CM | POA: Diagnosis not present

## 2016-06-23 DIAGNOSIS — Z4502 Encounter for adjustment and management of automatic implantable cardiac defibrillator: Secondary | ICD-10-CM

## 2016-06-23 NOTE — Patient Instructions (Addendum)
Medication Instructions:    Your physician recommends that you continue on your current medications as directed. Please refer to the Current Medication list given to you today.  --- If you need a refill on your cardiac medications before your next appointment, please call your pharmacy. ---  Labwork:  None ordered  Testing/Procedures:  None ordered  Follow-Up:  Your physician recommends that you schedule a follow-up appointment in: 3 months with device clinic.   Your physician wants you to follow-up in: 1 year with Dr. Lovena Le.  You will receive a reminder letter in the mail two months in advance. If you don't receive a letter, please call our office to schedule the follow-up appointment.  Thank you for choosing [facility]!!

## 2016-06-23 NOTE — Progress Notes (Signed)
HPI Cody Faulkner returns today for followup. He is a pleasant 71 yo man with VT, chronic systolic heart failure who was found to have a broken atrial lead and his device reprogrammed to VVI. He returns today for followup. He has been stable. He denies chest pain, sob, or syncope. No edema. 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 Allergies  Allergen Reactions  . Methimazole [Methimazole] Itching and Rash    Severe rash     Current Outpatient Prescriptions  Medication Sig Dispense Refill  . acetaminophen (TYLENOL) 500 MG tablet Take 500 mg by mouth every 6 (six) hours as needed for moderate pain.     Marland Kitchen albuterol (PROVENTIL HFA;VENTOLIN HFA) 108 (90 BASE) MCG/ACT inhaler Inhale 2 puffs into the lungs every 4 (four) hours as needed for shortness of breath.     . allopurinol (ZYLOPRIM) 100 MG tablet Take 100 mg by mouth daily.     Marland Kitchen amiodarone (PACERONE) 200 MG tablet TAKE ONE TABLET BY MOUTH ONCE DAILY 30 tablet 2  . bisoprolol (ZEBETA) 10 MG tablet Take 1 tablet (10 mg total) by mouth daily. 30 tablet 11  . budesonide-formoterol (SYMBICORT) 160-4.5 MCG/ACT inhaler Inhale 2 puffs into the lungs 2 (two) times daily.    . Cholecalciferol (VITAMIN D) 2000 UNITS tablet Take 2,000 Units by mouth daily.    Marland Kitchen docusate sodium (COLACE) 100 MG capsule Take 100 mg by mouth daily.    Marland Kitchen ELIQUIS 5 MG TABS tablet TAKE ONE TABLET BY MOUTH TWICE DAILY TO PREVENT STROKE 60 tablet 1  . fenofibrate 160 MG tablet Take 160 mg by mouth daily.    . furosemide (LASIX) 40 MG tablet Take 1 tablet (40 mg total) by mouth daily. Take one tablet by mouth except Monday, Wednesday and Friday take two tablets by mouth 40 tablet 3  . hydrALAZINE (APRESOLINE) 25 MG tablet Take 25 mg by mouth 2 (two) times daily.    . isosorbide mononitrate (IMDUR) 60 MG 24 hr tablet Take 1 tablet (60 mg total) by mouth daily. 30 tablet 2  . levothyroxine (SYNTHROID, LEVOTHROID) 100 MCG tablet Take 1 tablet (100 mcg  total) by mouth daily. 30 tablet 4  . loratadine (CLARITIN) 10 MG tablet Take 10 mg by mouth every morning.     . nystatin (MYCOSTATIN) powder Apply 1 g topically 3 (three) times daily as needed (itching). Reported on 06/19/2015    . potassium chloride SA (K-DUR,KLOR-CON) 20 MEQ tablet TAKE 1 TABLET BY MOUTH EVERY OTHER DAY. 15 tablet 1   No current facility-administered medications for this visit.      Past Medical History:  Diagnosis Date  . Asthma    Albuterol prn;Symbicort daily  . Atrial fibrillation ([place])    takes COumadin daily  . Bruises easily    d/t being on Coumadin  . CAD (coronary artery disease)    predominantly single vessel  . Cardiomyopathy, ischemic   . Cataracts, bilateral to be removed in Aug 2015  . Chronic kidney disease 1976   S/P nephrectomy  . Constipation    takes Colace daily as needed  . Depression   . Dilated cardiomyopathy ([place])    s/p defibrillator Medtronic [employer] [phone]  . Dizziness    occasionally  . GERD (gastroesophageal reflux disease)    takes OTC med if needed  . Gout    takes Allopurinol daily  . Heart failure ([place])    NYHFA  CLASS 3  . History of hyperthyroidism   . Hyperlipidemia    takes Fenofibrate daily  . Hypertension    takes Bisoprolol daily as well as Imdur  . Hypothyroidism    takes Synthroid daily  . Joint pain   . Joint swelling   . Left knee DJD    needs surgery  . Myocardial infarction 2007  . OSA (obstructive sleep apnea)    "don't wear mask; can't afford one" (12/20/2013)  . Pacemaker   . Peripheral edema    takes Furosemide daily  . Pneumonia, community acquired last time 2014   "get it ~ q yr; haven't had it yet in 2015" (12/20/2013)  . Seasonal allergies    takes Claritin daily  . Shortness of breath    with exertion  . Solitary kidney 06/05/2006  . Urinary frequency   . Urinary incontinence   . Urinary urgency   . Ventricular tachycardia ([place])    s/p defibrillator-Medtronic [employer] [id number]     ROS:   All systems reviewed and negative except as noted in the HPI.   Past Surgical History:  Procedure Laterality Date  . CARDIAC CATHETERIZATION  2007   60% Stenosis mid-CFX  . CARDIAC DEFIBRILLATOR PLACEMENT  2007   Medtronic EnTrust [phone]  . CARDIAC DEFIBRILLATOR REMOVAL  12/20/2013  . CARDIOVERSION  06/18/2011   Procedure: CARDIOVERSION;  Surgeon: Kerry Hough., MD;  Location: [place];  Service: Cardiovascular;  Laterality: N/A;  . CARDIOVERSION N/A 12/06/2012   Procedure: CARDIOVERSION;  Surgeon: Jacolyn Reedy, MD;  Location: [place];  Service: Cardiovascular;  Laterality: N/A;  . COLONOSCOPY    . ICD GENERATOR CHANGE  07/2012  . ICD LEAD REMOVAL Left 12/20/2013   Procedure: ICD LEAD REMOVAL//EXTRACTION AND PLACEMENT OF TEMP/PERM ATRIAL LEAD;  Surgeon: Evans Lance, MD;  Location: [place];  Service: Cardiovascular;  Laterality: Left;  . IMPLANTABLE CARDIOVERTER DEFIBRILLATOR IMPLANT N/A 01/25/2014   Procedure: IMPLANTABLE CARDIOVERTER DEFIBRILLATOR IMPLANT;  Surgeon: Evans Lance, MD;  Location[facility] C[facility] LAB;  Service: Cardiovascular;  Laterality: N/A;  . IMPLANTABLE CARDIOVERTER DEFIBRILLATOR REVISION N/A 07/13/2012   Procedure: IMPLANTABLE CARDIOVERTER DEFIBRILLATOR REVISION;  Surgeon: Deboraha Sprang, MD;  Location: [facility] CATH LAB;  Service: Cardiovascular;  Laterality: N/A;  . INSERT / REPLACE / REMOVE PACEMAKER  12/20/2013  . KNEE ARTHROSCOPY Left   . MULTIPLE TOOTH EXTRACTIONS     "top's are all gone; took some off the bottom too"  . NEPHRECTOMY  1976   Right  . TEE WITHOUT CARDIOVERSION  07/13/2012   Procedure: TRANSESOPHAGEAL ECHOCARDIOGRAM (TEE);  Surgeon: Lelon Perla, MD;  Location: [facility] ENDOSCOPY;  Service: Cardiovascular;  Laterality: N/A;     Family History  Problem Relation Age of Onset  . Heart disease Other     uncle  . Diabetes Other     uncle     Social History   Social History  . Marital status: Single    Spouse name: N/A  . Number of  children: N/A  . Years of education: N/A   Occupational History  . disabled    Social History Main Topics  . Smoking status: Never Smoker  . Smokeless tobacco: Never Used  . Alcohol use Yes     Comment: 12/20/2013 "might have drank a couple beers before; never really drank"  . Drug use: No  . Sexual activity: No   Other Topics Concern  . Not on file   Social History Narrative   Never married.  Worked previously in [employer].     BP 132/80   Pulse (!) 55   Ht 5\' 5"  (1.651 m)   Wt 229 lb (103.9 kg)   BMI 38.11 kg/m   Physical Exam:  stable appearing 71 yo man, diskempt but in NAD HEENT: Unremarkable Neck:  6 cm JVD, no thyromegally Lymphatics:  No adenopathy Back:  No CVA tenderness Lungs:  Clear with no wheezes HEART:  Regular rate rhythm, no murmurs, no rubs, no clicks Abd:  soft, positive bowel sounds, no organomegally, no rebound, no guarding Ext:  2 plus pulses, no edema, no cyanosis, no clubbing Skin:  No rashes no nodules Neuro:  CN II through XII intact, motor grossly intact   DEVICE  Normal device function.  See PaceArt for details.   Assess/Plan: 1. VT - he has had no recurrent VT. Will continue low dose amiodarone. 2. Chronic systolic heart failure - his symptoms are class 2. He will continue his current meds. 3. ICD - his medtronic ICD is working normally. Will recheck in several months. 4. Obesity - he remains active based on his activity sensor. He will be encouraged to eat less.  Cody Faulkner.D.

## 2016-07-02 ENCOUNTER — Other Ambulatory Visit: Payer: Self-pay | Admitting: *Deleted

## 2016-07-02 DIAGNOSIS — I480 Paroxysmal atrial fibrillation: Secondary | ICD-10-CM

## 2016-07-02 MED ORDER — APIXABAN 5 MG PO TABS: ORAL_TABLET | ORAL | 6 refills | Status: DC

## 2016-07-06 ENCOUNTER — Telehealth: Payer: Self-pay | Admitting: Cardiology

## 2016-07-06 DIAGNOSIS — D123 Benign neoplasm of transverse colon: Secondary | ICD-10-CM | POA: Diagnosis not present

## 2016-07-06 DIAGNOSIS — K635 Polyp of colon: Secondary | ICD-10-CM | POA: Diagnosis not present

## 2016-07-06 DIAGNOSIS — Z86010 Personal history of colonic polyps: Secondary | ICD-10-CM | POA: Diagnosis not present

## 2016-07-06 DIAGNOSIS — Z1211 Encounter for screening for malignant neoplasm of colon: Secondary | ICD-10-CM | POA: Diagnosis not present

## 2016-07-06 NOTE — Telephone Encounter (Signed)
Pt son called and stated that pt had a colonoscopy and he wanted to make sure the device was turned back on once the procedure was completed. Instructed pt son to send a remote transmission w/ his home monitor. Pt son verbalized understanding and said he would do it once he is back home.

## 2016-07-06 NOTE — Telephone Encounter (Signed)
LMOM to return call to device clinic.  Remote monitoring followed by Dr. Wynonia Lawman- they will have to call his office to get results of transmission.

## 2016-07-14 ENCOUNTER — Other Ambulatory Visit: Payer: Self-pay | Admitting: Internal Medicine

## 2016-07-15 ENCOUNTER — Other Ambulatory Visit: Payer: Self-pay | Admitting: Internal Medicine

## 2016-07-15 MED ORDER — POTASSIUM CHLORIDE CRYS ER 20 MEQ PO TBCR: 20.0000 meq | EXTENDED_RELEASE_TABLET | ORAL | 11 refills | Status: DC

## 2016-07-17 ENCOUNTER — Other Ambulatory Visit: Payer: Self-pay | Admitting: Internal Medicine

## 2016-07-17 DIAGNOSIS — I119 Hypertensive heart disease without heart failure: Secondary | ICD-10-CM

## 2016-07-17 DIAGNOSIS — I480 Paroxysmal atrial fibrillation: Secondary | ICD-10-CM

## 2016-07-17 MED ORDER — ISOSORBIDE MONONITRATE ER 60 MG PO TB24: 60.0000 mg | ORAL_TABLET | Freq: Every day | ORAL | 11 refills | Status: DC

## 2016-07-17 MED ORDER — FUROSEMIDE 40 MG PO TABS: 40.0000 mg | ORAL_TABLET | Freq: Every day | ORAL | 10 refills | Status: DC

## 2016-07-17 NOTE — Telephone Encounter (Signed)
LMOVM informing pt that Dr. Wynonia Lawman followed his remote monitoring.

## 2016-07-20 LAB — CUP PACEART INCLINIC DEVICE CHECK
Battery Remaining Longevity: 102 mo
Brady Statistic RA Percent Paced: 0 %
Brady Statistic RV Percent Paced: 0.1 % — CL
Implantable Lead Implant Date: 20150820
Implantable Lead Location: 753859
Implantable Lead Location: 753860
Implantable Lead Model: 6935
Lead Channel Impedance Value: 3000 Ohm
Lead Channel Impedance Value: 437 Ohm
Lead Channel Sensing Intrinsic Amplitude: 2 mV
Lead Channel Setting Pacing Amplitude: 2.5 V
Lead Channel Setting Pacing Pulse Width: 0.4 ms
Lead Channel Setting Sensing Sensitivity: 0.3 mV
MDC IDC LEAD IMPLANT DT: 20150820
MDC IDC MSMT LEADCHNL RV PACING THRESHOLD AMPLITUDE: 0.75 V
MDC IDC MSMT LEADCHNL RV PACING THRESHOLD PULSEWIDTH: 0.4 ms
MDC IDC MSMT LEADCHNL RV SENSING INTR AMPL: 17.6 mV
MDC IDC PG IMPLANT DT: 20150820
MDC IDC SESS DTM: 20180212143028

## 2016-07-21 DIAGNOSIS — Z79899 Other long term (current) drug therapy: Secondary | ICD-10-CM | POA: Diagnosis not present

## 2016-07-21 DIAGNOSIS — I252 Old myocardial infarction: Secondary | ICD-10-CM | POA: Diagnosis not present

## 2016-07-21 DIAGNOSIS — W1830XA Fall on same level, unspecified, initial encounter: Secondary | ICD-10-CM | POA: Diagnosis not present

## 2016-07-21 DIAGNOSIS — Z7901 Long term (current) use of anticoagulants: Secondary | ICD-10-CM | POA: Diagnosis not present

## 2016-07-21 DIAGNOSIS — I2510 Atherosclerotic heart disease of native coronary artery without angina pectoris: Secondary | ICD-10-CM | POA: Diagnosis not present

## 2016-07-21 DIAGNOSIS — M7989 Other specified soft tissue disorders: Secondary | ICD-10-CM | POA: Diagnosis not present

## 2016-07-21 DIAGNOSIS — S93401A Sprain of unspecified ligament of right ankle, initial encounter: Secondary | ICD-10-CM | POA: Diagnosis not present

## 2016-07-21 DIAGNOSIS — Z905 Acquired absence of kidney: Secondary | ICD-10-CM | POA: Diagnosis not present

## 2016-07-21 DIAGNOSIS — S9001XA Contusion of right ankle, initial encounter: Secondary | ICD-10-CM | POA: Diagnosis not present

## 2016-07-21 DIAGNOSIS — M25571 Pain in right ankle and joints of right foot: Secondary | ICD-10-CM | POA: Diagnosis not present

## 2016-07-21 DIAGNOSIS — E7800 Pure hypercholesterolemia, unspecified: Secondary | ICD-10-CM | POA: Diagnosis not present

## 2016-07-21 DIAGNOSIS — I10 Essential (primary) hypertension: Secondary | ICD-10-CM | POA: Diagnosis not present

## 2016-07-21 DIAGNOSIS — M25471 Effusion, right ankle: Secondary | ICD-10-CM | POA: Diagnosis not present

## 2016-07-24 ENCOUNTER — Other Ambulatory Visit: Payer: Self-pay | Admitting: *Deleted

## 2016-07-24 DIAGNOSIS — Z95810 Presence of automatic (implantable) cardiac defibrillator: Secondary | ICD-10-CM

## 2016-07-24 MED ORDER — AMIODARONE HCL 200 MG PO TABS: 200.0000 mg | ORAL_TABLET | Freq: Every day | ORAL | 3 refills | Status: DC

## 2016-08-25 DIAGNOSIS — J45998 Other asthma: Secondary | ICD-10-CM | POA: Diagnosis not present

## 2016-08-25 DIAGNOSIS — G4733 Obstructive sleep apnea (adult) (pediatric): Secondary | ICD-10-CM | POA: Diagnosis not present

## 2016-08-25 DIAGNOSIS — I472 Ventricular tachycardia: Secondary | ICD-10-CM | POA: Diagnosis not present

## 2016-08-25 DIAGNOSIS — E039 Hypothyroidism, unspecified: Secondary | ICD-10-CM | POA: Diagnosis not present

## 2016-08-25 DIAGNOSIS — I480 Paroxysmal atrial fibrillation: Secondary | ICD-10-CM | POA: Diagnosis not present

## 2016-08-25 DIAGNOSIS — Z95810 Presence of automatic (implantable) cardiac defibrillator: Secondary | ICD-10-CM | POA: Diagnosis not present

## 2016-08-25 DIAGNOSIS — I2510 Atherosclerotic heart disease of native coronary artery without angina pectoris: Secondary | ICD-10-CM | POA: Diagnosis not present

## 2016-08-25 DIAGNOSIS — Z7901 Long term (current) use of anticoagulants: Secondary | ICD-10-CM | POA: Diagnosis not present

## 2016-08-25 DIAGNOSIS — I5032 Chronic diastolic (congestive) heart failure: Secondary | ICD-10-CM | POA: Diagnosis not present

## 2016-08-25 DIAGNOSIS — I119 Hypertensive heart disease without heart failure: Secondary | ICD-10-CM | POA: Diagnosis not present

## 2016-08-25 DIAGNOSIS — N183 Chronic kidney disease, stage 3 (moderate): Secondary | ICD-10-CM | POA: Diagnosis not present

## 2016-08-27 DIAGNOSIS — J069 Acute upper respiratory infection, unspecified: Secondary | ICD-10-CM | POA: Diagnosis not present

## 2016-08-27 DIAGNOSIS — Z6841 Body Mass Index (BMI) 40.0 and over, adult: Secondary | ICD-10-CM | POA: Diagnosis not present

## 2016-09-03 DIAGNOSIS — J189 Pneumonia, unspecified organism: Secondary | ICD-10-CM | POA: Diagnosis not present

## 2016-09-21 ENCOUNTER — Ambulatory Visit (INDEPENDENT_AMBULATORY_CARE_PROVIDER_SITE_OTHER): Payer: PPO | Admitting: *Deleted

## 2016-09-21 VITALS — BP 143/68 | HR 53

## 2016-09-21 DIAGNOSIS — I480 Paroxysmal atrial fibrillation: Secondary | ICD-10-CM

## 2016-09-21 DIAGNOSIS — I472 Ventricular tachycardia: Secondary | ICD-10-CM

## 2016-09-21 DIAGNOSIS — I4729 Other ventricular tachycardia: Secondary | ICD-10-CM

## 2016-09-21 DIAGNOSIS — Z95810 Presence of automatic (implantable) cardiac defibrillator: Secondary | ICD-10-CM | POA: Diagnosis not present

## 2016-09-21 DIAGNOSIS — I420 Dilated cardiomyopathy: Secondary | ICD-10-CM

## 2016-09-21 NOTE — Progress Notes (Signed)
Patient seen in clinic for device follow-up.   No complaints of chest pain, shortness of breath, dizziness, or palpitations.    BP (!) 143/68 (BP Location: Right Arm, Cuff Size: Large)   Pulse (!) 53   Current Meds  Medication Sig  . acetaminophen (TYLENOL) 500 MG tablet Take 500 mg by mouth every 6 (six) hours as needed for moderate pain.   Marland Kitchen albuterol (PROVENTIL HFA;VENTOLIN HFA) 108 (90 BASE) MCG/ACT inhaler Inhale 2 puffs into the lungs every 4 (four) hours as needed for shortness of breath.   . allopurinol (ZYLOPRIM) 100 MG tablet Take 100 mg by mouth daily.   Marland Kitchen amiodarone (PACERONE) 200 MG tablet Take 1 tablet (200 mg total) by mouth daily.  Marland Kitchen apixaban (ELIQUIS) 5 MG TABS tablet TAKE ONE TABLET BY MOUTH TWICE DAILY TO PREVENT STROKE  . budesonide-formoterol (SYMBICORT) 160-4.5 MCG/ACT inhaler Inhale 2 puffs into the lungs 2 (two) times daily.  . Cholecalciferol (VITAMIN D) 2000 UNITS tablet Take 2,000 Units by mouth daily.  Marland Kitchen docusate sodium (COLACE) 100 MG capsule Take 100 mg by mouth daily.  . fenofibrate 160 MG tablet Take 160 mg by mouth daily.  . fluticasone (FLONASE) 50 MCG/ACT nasal spray 2 spray by Each Nare route daily.  . furosemide (LASIX) 40 MG tablet Take 1 tablet (40 mg total) by mouth daily. Take one tablet by mouth except Monday, Wednesday and Friday take two tablets by mouth  . hydrALAZINE (APRESOLINE) 25 MG tablet Take 25 mg by mouth 2 (two) times daily.  . isosorbide mononitrate (IMDUR) 60 MG 24 hr tablet Take 1 tablet (60 mg total) by mouth daily.  Marland Kitchen levothyroxine (SYNTHROID, LEVOTHROID) 100 MCG tablet Take 1 tablet (100 mcg total) by mouth daily.  Marland Kitchen loratadine (CLARITIN) 10 MG tablet Take 10 mg by mouth every morning.   . nystatin (MYCOSTATIN) powder Apply 1 g topically 3 (three) times daily as needed (itching). Reported on 06/19/2015  . potassium chloride SA (K-DUR,KLOR-CON) 20 MEQ tablet Take 1 tablet (20 mEq total) by mouth every other day.    Device pocket  well healed. Patient is not enrolled in remote monitoring (per his preference).  Patient has not been taking his bisoprolol, states he did not know about it. Otherwise, confirmed all active medications with bottles patient brought to clinic. Per pharmacy, bisoprolol has not been filled since 2016 so prescription has expired. Will defer to Dr. Lovena Le as to whether patient should resume taking bisoprolol.  ICD check in clinic. Normal device function. Thresholds and sensing consistent with previous device measurements--chronically elevated RA lead impedance. P-wave amplitude stable at 0.9-1.71mV, increased RA sensitivity to 0.53mV despite VVI programming to enhance discriminators per MDT rep recommendation. Impedance trends stable over time. No evidence of any ventricular arrhythmias. No mode switches. Histogram distribution appropriate for patient and level of activity. Device programmed at appropriate safety margins. Device programmed to optimize intrinsic conduction. Estimated longevity 8.1 years. Pt declines remote follow-up. Patient education completed including shock plan. Alert tones demonstrated for patient. ROV with Device Clinic on 12/21/16 and ROV with GT in 06/2017.  Mechele Dawley, RN, BSN, CCDS 09/21/2016 11:31 AM

## 2016-09-23 MED ORDER — BISOPROLOL FUMARATE 10 MG PO TABS: 10.0000 mg | ORAL_TABLET | Freq: Every day | ORAL | 11 refills | Status: DC

## 2016-09-23 NOTE — Addendum Note (Signed)
Addended by: Jacklynn Ganong on: 09/23/2016 08:06 AM   Modules accepted: Orders

## 2016-09-25 ENCOUNTER — Telehealth: Payer: Self-pay | Admitting: *Deleted

## 2016-09-25 NOTE — Telephone Encounter (Signed)
-----   Message from Mechele Dawley, RN sent at 09/21/2016 12:28 PM EDT ----- Per 06/23/16 OV med list, pt is taking bisoprolol 10mg  daily.  Per his pharmacy, this has not been filled since 2016.  Please clarify if pt should resume taking bisoprolol.  HR today was 53bpm, BP was 143/68.  Thanks!

## 2016-09-25 NOTE — Telephone Encounter (Signed)
lmtcb

## 2016-09-28 ENCOUNTER — Telehealth: Payer: Self-pay | Admitting: Cardiology

## 2016-09-28 NOTE — Telephone Encounter (Signed)
Spoke to [place], cousin - ok per patient. Confirmed patient is re-started on Bisoprolol 10 mg daily.

## 2016-09-28 NOTE — Telephone Encounter (Signed)
Pt is not sure about which medications he is taking, he seems a little confused.  He isn't at home to look at medication right now.  Advised to call me back when he gets home so that we can review medications he is currently taking. Pt is agreeable.

## 2016-09-28 NOTE — Telephone Encounter (Signed)
Spoke to [place], cousin, (ok per pt) and confirmed pt has restarted Bisoprolol.

## 2016-09-28 NOTE — Telephone Encounter (Signed)
New message  ° ° ° ° °Pt is returning your call  °

## 2016-09-28 NOTE — Telephone Encounter (Signed)
Follow up   Pt is returning call to Sherri from Friday.

## 2016-09-30 ENCOUNTER — Telehealth: Payer: Self-pay | Admitting: Internal Medicine

## 2016-09-30 ENCOUNTER — Telehealth: Payer: Self-pay

## 2016-09-30 DIAGNOSIS — Z95810 Presence of automatic (implantable) cardiac defibrillator: Secondary | ICD-10-CM | POA: Diagnosis not present

## 2016-09-30 DIAGNOSIS — I5030 Unspecified diastolic (congestive) heart failure: Secondary | ICD-10-CM | POA: Diagnosis not present

## 2016-09-30 DIAGNOSIS — I4891 Unspecified atrial fibrillation: Secondary | ICD-10-CM | POA: Diagnosis not present

## 2016-09-30 DIAGNOSIS — R001 Bradycardia, unspecified: Secondary | ICD-10-CM | POA: Diagnosis not present

## 2016-09-30 DIAGNOSIS — I959 Hypotension, unspecified: Secondary | ICD-10-CM | POA: Diagnosis not present

## 2016-09-30 NOTE — Telephone Encounter (Signed)
See other 4/25 phone note.

## 2016-09-30 NOTE — Telephone Encounter (Signed)
Kiara from [facility] in [place] called to report HR 42. Patient has no other complaints than some lower extremity swelling. Instructed Kiara to HOLD BISOPROLOL until further notice and to send a transmission if possible.  Routing to [facility] to follow-up.

## 2016-09-30 NOTE — Telephone Encounter (Signed)
Follow up   Doctor office is calling back about discontinuing his medication. He's at the office and his pulse is 42.

## 2016-09-30 NOTE — Telephone Encounter (Signed)
[facility], patient currently in office at time of call. Patient had a low pulse rate of 42 and they would like to verify his medications. Thanks.

## 2016-09-30 NOTE — Telephone Encounter (Signed)
Cierra calling from [facility] and is asking for a current list of medications for this patient. Information provided and she thanked me for the call.

## 2016-10-01 NOTE — Telephone Encounter (Signed)
Spoke with Theadora Rama at Dr. Maurice Small office. Explained to her that [facility]) called yesterday to report patient's HR was 42. The only other complaint was some LE swelling. Informed her Phineas Real was to instruct the patient to HOLD BISOPROLOL until further notice. Phineas Real also instructed the patient to send a remote transmission. Explained to [facility] that Dr. Oran Rein follows this patient's remotes (per the [facility]) and he will have to pull the readings. Dr. Maurice Small office will follow-up with the patient.

## 2016-10-01 NOTE — Telephone Encounter (Signed)
Dr. Wynonia Lawman follows this pt's remotes. Dr. Lovena Le see's him once a year and in device clinic once a year. We don't access to see Dr. Thurman Coyer remotes.

## 2016-10-05 ENCOUNTER — Encounter: Payer: Self-pay | Admitting: Cardiology

## 2016-10-05 DIAGNOSIS — J45998 Other asthma: Secondary | ICD-10-CM | POA: Diagnosis not present

## 2016-10-05 DIAGNOSIS — R600 Localized edema: Secondary | ICD-10-CM | POA: Diagnosis not present

## 2016-10-05 DIAGNOSIS — I480 Paroxysmal atrial fibrillation: Secondary | ICD-10-CM | POA: Diagnosis not present

## 2016-10-05 DIAGNOSIS — R0602 Shortness of breath: Secondary | ICD-10-CM | POA: Diagnosis not present

## 2016-10-05 DIAGNOSIS — Z95810 Presence of automatic (implantable) cardiac defibrillator: Secondary | ICD-10-CM | POA: Diagnosis not present

## 2016-10-05 DIAGNOSIS — E039 Hypothyroidism, unspecified: Secondary | ICD-10-CM | POA: Diagnosis not present

## 2016-10-05 DIAGNOSIS — I472 Ventricular tachycardia: Secondary | ICD-10-CM | POA: Diagnosis not present

## 2016-10-05 DIAGNOSIS — I119 Hypertensive heart disease without heart failure: Secondary | ICD-10-CM | POA: Diagnosis not present

## 2016-10-05 DIAGNOSIS — I5032 Chronic diastolic (congestive) heart failure: Secondary | ICD-10-CM | POA: Diagnosis not present

## 2016-10-05 DIAGNOSIS — G4733 Obstructive sleep apnea (adult) (pediatric): Secondary | ICD-10-CM | POA: Diagnosis not present

## 2016-10-05 DIAGNOSIS — Z7901 Long term (current) use of anticoagulants: Secondary | ICD-10-CM | POA: Diagnosis not present

## 2016-10-05 DIAGNOSIS — I2510 Atherosclerotic heart disease of native coronary artery without angina pectoris: Secondary | ICD-10-CM | POA: Diagnosis not present

## 2016-10-05 DIAGNOSIS — N183 Chronic kidney disease, stage 3 (moderate): Secondary | ICD-10-CM | POA: Diagnosis not present

## 2016-10-06 ENCOUNTER — Other Ambulatory Visit: Payer: Self-pay | Admitting: Internal Medicine

## 2016-10-07 DIAGNOSIS — Z79899 Other long term (current) drug therapy: Secondary | ICD-10-CM | POA: Diagnosis not present

## 2016-10-07 DIAGNOSIS — Z125 Encounter for screening for malignant neoplasm of prostate: Secondary | ICD-10-CM | POA: Diagnosis not present

## 2016-10-07 DIAGNOSIS — M109 Gout, unspecified: Secondary | ICD-10-CM | POA: Diagnosis not present

## 2016-10-07 DIAGNOSIS — E7800 Pure hypercholesterolemia, unspecified: Secondary | ICD-10-CM | POA: Diagnosis not present

## 2016-10-07 DIAGNOSIS — N183 Chronic kidney disease, stage 3 (moderate): Secondary | ICD-10-CM | POA: Diagnosis not present

## 2016-10-14 DIAGNOSIS — I4891 Unspecified atrial fibrillation: Secondary | ICD-10-CM | POA: Diagnosis not present

## 2016-10-14 DIAGNOSIS — E7800 Pure hypercholesterolemia, unspecified: Secondary | ICD-10-CM | POA: Diagnosis not present

## 2016-10-14 DIAGNOSIS — R001 Bradycardia, unspecified: Secondary | ICD-10-CM | POA: Diagnosis not present

## 2016-10-14 DIAGNOSIS — I10 Essential (primary) hypertension: Secondary | ICD-10-CM | POA: Diagnosis not present

## 2016-10-14 DIAGNOSIS — E039 Hypothyroidism, unspecified: Secondary | ICD-10-CM | POA: Diagnosis not present

## 2016-10-14 DIAGNOSIS — K5909 Other constipation: Secondary | ICD-10-CM | POA: Diagnosis not present

## 2016-10-14 DIAGNOSIS — J45909 Unspecified asthma, uncomplicated: Secondary | ICD-10-CM | POA: Diagnosis not present

## 2016-10-14 DIAGNOSIS — M109 Gout, unspecified: Secondary | ICD-10-CM | POA: Diagnosis not present

## 2016-10-19 DIAGNOSIS — M25562 Pain in left knee: Secondary | ICD-10-CM | POA: Diagnosis not present

## 2016-10-19 DIAGNOSIS — Z6841 Body Mass Index (BMI) 40.0 and over, adult: Secondary | ICD-10-CM | POA: Diagnosis not present

## 2016-10-19 DIAGNOSIS — Z9181 History of falling: Secondary | ICD-10-CM | POA: Diagnosis not present

## 2016-10-26 DIAGNOSIS — M25562 Pain in left knee: Secondary | ICD-10-CM | POA: Diagnosis not present

## 2016-10-26 DIAGNOSIS — Z139 Encounter for screening, unspecified: Secondary | ICD-10-CM | POA: Diagnosis not present

## 2016-10-26 DIAGNOSIS — Z6839 Body mass index (BMI) 39.0-39.9, adult: Secondary | ICD-10-CM | POA: Diagnosis not present

## 2016-12-03 DIAGNOSIS — E668 Other obesity: Secondary | ICD-10-CM | POA: Diagnosis not present

## 2016-12-03 DIAGNOSIS — J45998 Other asthma: Secondary | ICD-10-CM | POA: Diagnosis not present

## 2016-12-03 DIAGNOSIS — I119 Hypertensive heart disease without heart failure: Secondary | ICD-10-CM | POA: Diagnosis not present

## 2016-12-03 DIAGNOSIS — Z95810 Presence of automatic (implantable) cardiac defibrillator: Secondary | ICD-10-CM | POA: Diagnosis not present

## 2016-12-03 DIAGNOSIS — I2510 Atherosclerotic heart disease of native coronary artery without angina pectoris: Secondary | ICD-10-CM | POA: Diagnosis not present

## 2016-12-03 DIAGNOSIS — R0602 Shortness of breath: Secondary | ICD-10-CM | POA: Diagnosis not present

## 2016-12-03 DIAGNOSIS — N183 Chronic kidney disease, stage 3 (moderate): Secondary | ICD-10-CM | POA: Diagnosis not present

## 2016-12-03 DIAGNOSIS — Z7901 Long term (current) use of anticoagulants: Secondary | ICD-10-CM | POA: Diagnosis not present

## 2016-12-03 DIAGNOSIS — E039 Hypothyroidism, unspecified: Secondary | ICD-10-CM | POA: Diagnosis not present

## 2016-12-03 DIAGNOSIS — I480 Paroxysmal atrial fibrillation: Secondary | ICD-10-CM | POA: Diagnosis not present

## 2016-12-03 DIAGNOSIS — G4733 Obstructive sleep apnea (adult) (pediatric): Secondary | ICD-10-CM | POA: Diagnosis not present

## 2016-12-03 DIAGNOSIS — I5032 Chronic diastolic (congestive) heart failure: Secondary | ICD-10-CM | POA: Diagnosis not present

## 2016-12-21 ENCOUNTER — Ambulatory Visit (INDEPENDENT_AMBULATORY_CARE_PROVIDER_SITE_OTHER): Payer: PPO | Admitting: *Deleted

## 2016-12-21 ENCOUNTER — Encounter (INDEPENDENT_AMBULATORY_CARE_PROVIDER_SITE_OTHER): Payer: Self-pay

## 2016-12-21 DIAGNOSIS — I5022 Chronic systolic (congestive) heart failure: Secondary | ICD-10-CM

## 2016-12-21 DIAGNOSIS — I420 Dilated cardiomyopathy: Secondary | ICD-10-CM

## 2016-12-21 LAB — CUP PACEART INCLINIC DEVICE CHECK
Brady Statistic AP VS Percent: 0 %
Brady Statistic AS VS Percent: 99.91 %
Brady Statistic RV Percent Paced: 0.11 %
Date Time Interrogation Session: 20180716091721
HighPow Impedance: 64 Ohm
Implantable Lead Location: 753859
Implantable Lead Model: 5076
Lead Channel Impedance Value: 4047 Ohm
Lead Channel Pacing Threshold Amplitude: 0.75 V
Lead Channel Setting Pacing Amplitude: 2.5 V
Lead Channel Setting Pacing Pulse Width: 0.4 ms
Lead Channel Setting Sensing Sensitivity: 0.3 mV
MDC IDC LEAD IMPLANT DT: 20150820
MDC IDC LEAD IMPLANT DT: 20150820
MDC IDC LEAD LOCATION: 753860
MDC IDC MSMT BATTERY REMAINING LONGEVITY: 90 mo
MDC IDC MSMT BATTERY VOLTAGE: 2.99 V
MDC IDC MSMT LEADCHNL RA SENSING INTR AMPL: 0.375 mV
MDC IDC MSMT LEADCHNL RA SENSING INTR AMPL: 2 mV
MDC IDC MSMT LEADCHNL RV IMPEDANCE VALUE: 342 Ohm
MDC IDC MSMT LEADCHNL RV IMPEDANCE VALUE: 437 Ohm
MDC IDC MSMT LEADCHNL RV PACING THRESHOLD PULSEWIDTH: 0.4 ms
MDC IDC MSMT LEADCHNL RV SENSING INTR AMPL: 13.25 mV
MDC IDC MSMT LEADCHNL RV SENSING INTR AMPL: 16.5 mV
MDC IDC PG IMPLANT DT: 20150820
MDC IDC STAT BRADY AP VP PERCENT: 0 %
MDC IDC STAT BRADY AS VP PERCENT: 0.09 %
MDC IDC STAT BRADY RA PERCENT PACED: 0 %

## 2016-12-21 NOTE — Progress Notes (Signed)
ICD check in clinic. Normal device function. Threshold and sensing consistent with previous device measurements. Impedance trends stable over time. No evidence of any ventricular arrhythmias. Histogram distribution appropriate for patient and level of activity. Optivol stable. No changes made this session. Device programmed at appropriate safety margins. Device programmed to optimize intrinsic conduction. Estimated longevity 7.4 years. Pt declines remote follow-up. ROV with Device Clinic 03/22/17 and with GT in Jan.  Mr. Fitzhenry follows with Dr. Wynonia Lawman for general cardiology but reports that Dr. Wynonia Lawman recently checked the device (last device interrogation per programmer 12/01/16). I offered that we could transfer device care to Dr. Wynonia Lawman but patient seems to be confused about who he should see for what, he reports "I just want to be safe." I have elected to leave care as is until Mr. Dilone follows up with Dr. Lovena Le in Jan.

## 2016-12-31 DIAGNOSIS — L309 Dermatitis, unspecified: Secondary | ICD-10-CM | POA: Diagnosis not present

## 2016-12-31 DIAGNOSIS — Z6841 Body Mass Index (BMI) 40.0 and over, adult: Secondary | ICD-10-CM | POA: Diagnosis not present

## 2016-12-31 DIAGNOSIS — E039 Hypothyroidism, unspecified: Secondary | ICD-10-CM | POA: Diagnosis not present

## 2017-01-27 DIAGNOSIS — L309 Dermatitis, unspecified: Secondary | ICD-10-CM | POA: Diagnosis not present

## 2017-01-27 DIAGNOSIS — F419 Anxiety disorder, unspecified: Secondary | ICD-10-CM | POA: Diagnosis not present

## 2017-01-27 DIAGNOSIS — Z6838 Body mass index (BMI) 38.0-38.9, adult: Secondary | ICD-10-CM | POA: Diagnosis not present

## 2017-02-15 ENCOUNTER — Other Ambulatory Visit: Payer: Self-pay

## 2017-02-15 DIAGNOSIS — I480 Paroxysmal atrial fibrillation: Secondary | ICD-10-CM

## 2017-02-15 MED ORDER — APIXABAN 5 MG PO TABS: ORAL_TABLET | ORAL | 4 refills | Status: DC

## 2017-02-26 DIAGNOSIS — Z6839 Body mass index (BMI) 39.0-39.9, adult: Secondary | ICD-10-CM | POA: Diagnosis not present

## 2017-02-26 DIAGNOSIS — F419 Anxiety disorder, unspecified: Secondary | ICD-10-CM | POA: Diagnosis not present

## 2017-02-26 DIAGNOSIS — L309 Dermatitis, unspecified: Secondary | ICD-10-CM | POA: Diagnosis not present

## 2017-03-02 DIAGNOSIS — I5032 Chronic diastolic (congestive) heart failure: Secondary | ICD-10-CM | POA: Diagnosis not present

## 2017-03-02 DIAGNOSIS — E668 Other obesity: Secondary | ICD-10-CM | POA: Diagnosis not present

## 2017-03-02 DIAGNOSIS — I480 Paroxysmal atrial fibrillation: Secondary | ICD-10-CM | POA: Diagnosis not present

## 2017-03-02 DIAGNOSIS — Z7901 Long term (current) use of anticoagulants: Secondary | ICD-10-CM | POA: Diagnosis not present

## 2017-03-02 DIAGNOSIS — I119 Hypertensive heart disease without heart failure: Secondary | ICD-10-CM | POA: Diagnosis not present

## 2017-03-02 DIAGNOSIS — Z95810 Presence of automatic (implantable) cardiac defibrillator: Secondary | ICD-10-CM | POA: Diagnosis not present

## 2017-03-02 DIAGNOSIS — J45998 Other asthma: Secondary | ICD-10-CM | POA: Diagnosis not present

## 2017-03-02 DIAGNOSIS — R0602 Shortness of breath: Secondary | ICD-10-CM | POA: Diagnosis not present

## 2017-03-02 DIAGNOSIS — E039 Hypothyroidism, unspecified: Secondary | ICD-10-CM | POA: Diagnosis not present

## 2017-03-02 DIAGNOSIS — N183 Chronic kidney disease, stage 3 (moderate): Secondary | ICD-10-CM | POA: Diagnosis not present

## 2017-03-02 DIAGNOSIS — I2510 Atherosclerotic heart disease of native coronary artery without angina pectoris: Secondary | ICD-10-CM | POA: Diagnosis not present

## 2017-03-02 DIAGNOSIS — G4733 Obstructive sleep apnea (adult) (pediatric): Secondary | ICD-10-CM | POA: Diagnosis not present

## 2017-03-22 ENCOUNTER — Encounter (INDEPENDENT_AMBULATORY_CARE_PROVIDER_SITE_OTHER): Payer: Self-pay

## 2017-03-22 ENCOUNTER — Ambulatory Visit (INDEPENDENT_AMBULATORY_CARE_PROVIDER_SITE_OTHER): Payer: PPO | Admitting: *Deleted

## 2017-03-22 DIAGNOSIS — I5022 Chronic systolic (congestive) heart failure: Secondary | ICD-10-CM

## 2017-03-22 DIAGNOSIS — I420 Dilated cardiomyopathy: Secondary | ICD-10-CM

## 2017-03-22 LAB — CUP PACEART INCLINIC DEVICE CHECK
Battery Remaining Longevity: 88 mo
Brady Statistic AP VP Percent: 0 %
Brady Statistic RA Percent Paced: 0 %
Brady Statistic RV Percent Paced: 0.22 %
Date Time Interrogation Session: 20181015102714
HIGH POWER IMPEDANCE MEASURED VALUE: 68 Ohm
Implantable Lead Implant Date: 20150820
Implantable Lead Location: 753859
Implantable Lead Model: 5076
Implantable Pulse Generator Implant Date: 20150820
Lead Channel Impedance Value: 323 Ohm
Lead Channel Impedance Value: 4047 Ohm
Lead Channel Impedance Value: 456 Ohm
Lead Channel Sensing Intrinsic Amplitude: 17.125 mV
Lead Channel Sensing Intrinsic Amplitude: 2.125 mV
Lead Channel Setting Sensing Sensitivity: 0.3 mV
MDC IDC LEAD IMPLANT DT: 20150820
MDC IDC LEAD LOCATION: 753860
MDC IDC MSMT BATTERY VOLTAGE: 2.98 V
MDC IDC MSMT LEADCHNL RA SENSING INTR AMPL: 0.5 mV
MDC IDC MSMT LEADCHNL RV PACING THRESHOLD AMPLITUDE: 0.75 V
MDC IDC MSMT LEADCHNL RV PACING THRESHOLD PULSEWIDTH: 0.4 ms
MDC IDC MSMT LEADCHNL RV SENSING INTR AMPL: 14.625 mV
MDC IDC SET LEADCHNL RV PACING AMPLITUDE: 2.5 V
MDC IDC SET LEADCHNL RV PACING PULSEWIDTH: 0.4 ms
MDC IDC STAT BRADY AP VS PERCENT: 0 %
MDC IDC STAT BRADY AS VP PERCENT: 0.17 %
MDC IDC STAT BRADY AS VS PERCENT: 99.83 %

## 2017-03-22 NOTE — Progress Notes (Signed)
ICD check in clinic. Normal device function. Threshold and sensing consistent with previous device measurements. Impedance trends stable over time. No evidence of any ventricular arrhythmias. Histogram distribution appropriate for patient and level of activity. No changes made this session. Device programmed at appropriate safety margins. Device programmed to optimize intrinsic conduction. Estimated longevity 7.2 years. ROV with GT 06/23/17.

## 2017-04-02 DIAGNOSIS — E785 Hyperlipidemia, unspecified: Secondary | ICD-10-CM | POA: Diagnosis not present

## 2017-04-02 DIAGNOSIS — Z23 Encounter for immunization: Secondary | ICD-10-CM | POA: Diagnosis not present

## 2017-04-02 DIAGNOSIS — Z139 Encounter for screening, unspecified: Secondary | ICD-10-CM | POA: Diagnosis not present

## 2017-04-02 DIAGNOSIS — E669 Obesity, unspecified: Secondary | ICD-10-CM | POA: Diagnosis not present

## 2017-04-02 DIAGNOSIS — Z6838 Body mass index (BMI) 38.0-38.9, adult: Secondary | ICD-10-CM | POA: Diagnosis not present

## 2017-04-02 DIAGNOSIS — Z125 Encounter for screening for malignant neoplasm of prostate: Secondary | ICD-10-CM | POA: Diagnosis not present

## 2017-04-02 DIAGNOSIS — Z0000 Encounter for general adult medical examination without abnormal findings: Secondary | ICD-10-CM | POA: Diagnosis not present

## 2017-04-02 DIAGNOSIS — Z136 Encounter for screening for cardiovascular disorders: Secondary | ICD-10-CM | POA: Diagnosis not present

## 2017-04-05 DIAGNOSIS — H2703 Aphakia, bilateral: Secondary | ICD-10-CM | POA: Diagnosis not present

## 2017-04-21 DIAGNOSIS — Z6839 Body mass index (BMI) 39.0-39.9, adult: Secondary | ICD-10-CM | POA: Diagnosis not present

## 2017-04-21 DIAGNOSIS — J40 Bronchitis, not specified as acute or chronic: Secondary | ICD-10-CM | POA: Diagnosis not present

## 2017-04-26 DIAGNOSIS — E7800 Pure hypercholesterolemia, unspecified: Secondary | ICD-10-CM | POA: Diagnosis not present

## 2017-04-26 DIAGNOSIS — E039 Hypothyroidism, unspecified: Secondary | ICD-10-CM | POA: Diagnosis not present

## 2017-04-26 DIAGNOSIS — Z79899 Other long term (current) drug therapy: Secondary | ICD-10-CM | POA: Diagnosis not present

## 2017-04-28 DIAGNOSIS — E7800 Pure hypercholesterolemia, unspecified: Secondary | ICD-10-CM | POA: Diagnosis not present

## 2017-04-28 DIAGNOSIS — F419 Anxiety disorder, unspecified: Secondary | ICD-10-CM | POA: Diagnosis not present

## 2017-04-28 DIAGNOSIS — M109 Gout, unspecified: Secondary | ICD-10-CM | POA: Diagnosis not present

## 2017-04-28 DIAGNOSIS — I4891 Unspecified atrial fibrillation: Secondary | ICD-10-CM | POA: Diagnosis not present

## 2017-04-28 DIAGNOSIS — E039 Hypothyroidism, unspecified: Secondary | ICD-10-CM | POA: Diagnosis not present

## 2017-04-28 DIAGNOSIS — I5030 Unspecified diastolic (congestive) heart failure: Secondary | ICD-10-CM | POA: Diagnosis not present

## 2017-04-28 DIAGNOSIS — I10 Essential (primary) hypertension: Secondary | ICD-10-CM | POA: Diagnosis not present

## 2017-04-28 DIAGNOSIS — L309 Dermatitis, unspecified: Secondary | ICD-10-CM | POA: Diagnosis not present

## 2017-05-05 ENCOUNTER — Encounter: Payer: Self-pay | Admitting: Internal Medicine

## 2017-06-07 DIAGNOSIS — I119 Hypertensive heart disease without heart failure: Secondary | ICD-10-CM | POA: Diagnosis not present

## 2017-06-07 DIAGNOSIS — I5032 Chronic diastolic (congestive) heart failure: Secondary | ICD-10-CM | POA: Diagnosis not present

## 2017-06-07 DIAGNOSIS — I2510 Atherosclerotic heart disease of native coronary artery without angina pectoris: Secondary | ICD-10-CM | POA: Diagnosis not present

## 2017-06-07 DIAGNOSIS — R0602 Shortness of breath: Secondary | ICD-10-CM | POA: Diagnosis not present

## 2017-06-07 DIAGNOSIS — E039 Hypothyroidism, unspecified: Secondary | ICD-10-CM | POA: Diagnosis not present

## 2017-06-07 DIAGNOSIS — G4733 Obstructive sleep apnea (adult) (pediatric): Secondary | ICD-10-CM | POA: Diagnosis not present

## 2017-06-07 DIAGNOSIS — E668 Other obesity: Secondary | ICD-10-CM | POA: Diagnosis not present

## 2017-06-07 DIAGNOSIS — N183 Chronic kidney disease, stage 3 (moderate): Secondary | ICD-10-CM | POA: Diagnosis not present

## 2017-06-07 DIAGNOSIS — Z95810 Presence of automatic (implantable) cardiac defibrillator: Secondary | ICD-10-CM | POA: Diagnosis not present

## 2017-06-07 DIAGNOSIS — J45998 Other asthma: Secondary | ICD-10-CM | POA: Diagnosis not present

## 2017-06-07 DIAGNOSIS — Z7901 Long term (current) use of anticoagulants: Secondary | ICD-10-CM | POA: Diagnosis not present

## 2017-06-07 DIAGNOSIS — I480 Paroxysmal atrial fibrillation: Secondary | ICD-10-CM | POA: Diagnosis not present

## 2017-06-15 DIAGNOSIS — M109 Gout, unspecified: Secondary | ICD-10-CM | POA: Diagnosis not present

## 2017-06-15 DIAGNOSIS — Z6838 Body mass index (BMI) 38.0-38.9, adult: Secondary | ICD-10-CM | POA: Diagnosis not present

## 2017-06-15 DIAGNOSIS — L03114 Cellulitis of left upper limb: Secondary | ICD-10-CM | POA: Diagnosis not present

## 2017-06-17 DIAGNOSIS — M109 Gout, unspecified: Secondary | ICD-10-CM | POA: Diagnosis not present

## 2017-06-17 DIAGNOSIS — L03114 Cellulitis of left upper limb: Secondary | ICD-10-CM | POA: Diagnosis not present

## 2017-06-17 DIAGNOSIS — Z6838 Body mass index (BMI) 38.0-38.9, adult: Secondary | ICD-10-CM | POA: Diagnosis not present

## 2017-06-21 ENCOUNTER — Other Ambulatory Visit: Payer: Self-pay | Admitting: Internal Medicine

## 2017-06-21 DIAGNOSIS — I480 Paroxysmal atrial fibrillation: Secondary | ICD-10-CM

## 2017-06-21 MED ORDER — FUROSEMIDE 40 MG PO TABS: ORAL_TABLET | ORAL | 0 refills | Status: DC

## 2017-06-23 ENCOUNTER — Encounter: Payer: PPO | Admitting: Internal Medicine

## 2017-07-05 ENCOUNTER — Other Ambulatory Visit: Payer: Self-pay | Admitting: *Deleted

## 2017-07-05 DIAGNOSIS — I480 Paroxysmal atrial fibrillation: Secondary | ICD-10-CM

## 2017-07-05 MED ORDER — APIXABAN 5 MG PO TABS: ORAL_TABLET | ORAL | 0 refills | Status: DC

## 2017-07-05 NOTE — Telephone Encounter (Signed)
Eliquis 5mg  refill request received; pt is 72 yrs old, wt-103.9kg, Crea-1.74 and this was last done on 07/28/2013-therefore pt needs labs, pt was last seen by Dr. Lovena Le on 06/23/16 & has an appt coming up on 07/30/17 at 945am-will send in enough to get pt to that appt only.

## 2017-07-16 ENCOUNTER — Other Ambulatory Visit: Payer: Self-pay | Admitting: *Deleted

## 2017-07-16 MED ORDER — POTASSIUM CHLORIDE CRYS ER 20 MEQ PO TBCR: 20.0000 meq | EXTENDED_RELEASE_TABLET | ORAL | 0 refills | Status: DC

## 2017-07-22 ENCOUNTER — Other Ambulatory Visit: Payer: Self-pay

## 2017-07-22 DIAGNOSIS — Z95810 Presence of automatic (implantable) cardiac defibrillator: Secondary | ICD-10-CM

## 2017-07-22 MED ORDER — AMIODARONE HCL 200 MG PO TABS: 200.0000 mg | ORAL_TABLET | Freq: Every day | ORAL | 0 refills | Status: DC

## 2017-07-26 ENCOUNTER — Other Ambulatory Visit: Payer: Self-pay | Admitting: Internal Medicine

## 2017-07-26 DIAGNOSIS — I119 Hypertensive heart disease without heart failure: Secondary | ICD-10-CM

## 2017-07-28 ENCOUNTER — Other Ambulatory Visit: Payer: Self-pay | Admitting: Internal Medicine

## 2017-07-28 DIAGNOSIS — I480 Paroxysmal atrial fibrillation: Secondary | ICD-10-CM

## 2017-07-30 ENCOUNTER — Encounter: Payer: Self-pay | Admitting: Internal Medicine

## 2017-07-30 ENCOUNTER — Ambulatory Visit: Payer: PPO | Admitting: Internal Medicine

## 2017-07-30 VITALS — BP 120/58 | HR 59 | Ht 65.0 in | Wt 207.0 lb

## 2017-07-30 DIAGNOSIS — I472 Ventricular tachycardia: Secondary | ICD-10-CM

## 2017-07-30 DIAGNOSIS — I119 Hypertensive heart disease without heart failure: Secondary | ICD-10-CM | POA: Diagnosis not present

## 2017-07-30 DIAGNOSIS — I5022 Chronic systolic (congestive) heart failure: Secondary | ICD-10-CM

## 2017-07-30 DIAGNOSIS — I480 Paroxysmal atrial fibrillation: Secondary | ICD-10-CM | POA: Diagnosis not present

## 2017-07-30 DIAGNOSIS — I4729 Other ventricular tachycardia: Secondary | ICD-10-CM

## 2017-07-30 DIAGNOSIS — Z95810 Presence of automatic (implantable) cardiac defibrillator: Secondary | ICD-10-CM | POA: Diagnosis not present

## 2017-07-30 LAB — CUP PACEART INCLINIC DEVICE CHECK
Battery Remaining Longevity: 79 mo
Battery Voltage: 2.99 V
Brady Statistic AP VP Percent: 0 %
Brady Statistic AP VS Percent: 0 %
Brady Statistic AS VS Percent: 99.93 %
Brady Statistic RA Percent Paced: 0 %
Date Time Interrogation Session: 20190222103142
HIGH POWER IMPEDANCE MEASURED VALUE: 66 Ohm
Implantable Lead Implant Date: 20150820
Implantable Lead Location: 753859
Implantable Lead Location: 753860
Implantable Pulse Generator Implant Date: 20150820
Lead Channel Impedance Value: 399 Ohm
Lead Channel Impedance Value: 4047 Ohm
Lead Channel Pacing Threshold Amplitude: 0.875 V
Lead Channel Pacing Threshold Amplitude: 2.375 V
Lead Channel Pacing Threshold Pulse Width: 0.4 ms
Lead Channel Pacing Threshold Pulse Width: 0.4 ms
Lead Channel Sensing Intrinsic Amplitude: 14.25 mV
Lead Channel Setting Sensing Sensitivity: 0.3 mV
MDC IDC LEAD IMPLANT DT: 20150820
MDC IDC MSMT LEADCHNL RA SENSING INTR AMPL: 0.5 mV
MDC IDC MSMT LEADCHNL RA SENSING INTR AMPL: 1.875 mV
MDC IDC MSMT LEADCHNL RV IMPEDANCE VALUE: 342 Ohm
MDC IDC MSMT LEADCHNL RV SENSING INTR AMPL: 13.5 mV
MDC IDC SET LEADCHNL RV PACING AMPLITUDE: 2.5 V
MDC IDC SET LEADCHNL RV PACING PULSEWIDTH: 0.4 ms
MDC IDC STAT BRADY AS VP PERCENT: 0.07 %
MDC IDC STAT BRADY RV PERCENT PACED: 0.1 %

## 2017-07-30 MED ORDER — APIXABAN 5 MG PO TABS: ORAL_TABLET | ORAL | 11 refills | Status: DC

## 2017-07-30 MED ORDER — ISOSORBIDE MONONITRATE ER 60 MG PO TB24: 60.0000 mg | ORAL_TABLET | Freq: Every day | ORAL | 11 refills | Status: DC

## 2017-07-30 MED ORDER — FUROSEMIDE 40 MG PO TABS
ORAL_TABLET | ORAL | 11 refills | Status: DC
Start: 2017-07-30 — End: 2017-11-02

## 2017-07-30 MED ORDER — HYDRALAZINE HCL 25 MG PO TABS: 25.0000 mg | ORAL_TABLET | Freq: Two times a day (BID) | ORAL | 11 refills | Status: DC

## 2017-07-30 MED ORDER — BISOPROLOL FUMARATE 10 MG PO TABS: 10.0000 mg | ORAL_TABLET | Freq: Every day | ORAL | 11 refills | Status: DC

## 2017-07-30 MED ORDER — AMIODARONE HCL 200 MG PO TABS: 200.0000 mg | ORAL_TABLET | Freq: Every day | ORAL | 11 refills | Status: DC

## 2017-07-30 NOTE — Progress Notes (Signed)
HPI [name] returns today for followup. He is a pleasant 72 yo man with VT, chronic systolic heart failure who was found to have a broken atrial lead and his device reprogrammed to VVI. He returns today for followup. He has been stable. He denies chest pain, sob, or syncope. No edema. He remains active picking up chicken eggs 7 days a week.  Allergies  Allergen Reactions  . Methimazole [Methimazole] Itching and Rash    Severe rash     Current Outpatient Medications  Medication Sig Dispense Refill  . acetaminophen (TYLENOL) 500 MG tablet Take 500 mg by mouth every 6 (six) hours as needed for moderate pain.     Marland Kitchen albuterol (PROVENTIL HFA;VENTOLIN HFA) 108 (90 BASE) MCG/ACT inhaler Inhale 2 puffs into the lungs every 4 (four) hours as needed for shortness of breath.     . allopurinol (ZYLOPRIM) 100 MG tablet Take 100 mg by mouth daily.     Marland Kitchen amiodarone (PACERONE) 200 MG tablet Take 1 tablet (200 mg total) by mouth daily. Please schedule yearly appt for more refills, thanks! [phone] 1st Attmpt 30 tablet 0  . apixaban (ELIQUIS) 5 MG TABS tablet TAKE ONE TABLET BY MOUTH TWICE DAILY TO PREVENT STROKE 60 tablet 0  . bisoprolol (ZEBETA) 10 MG tablet Take 1 tablet (10 mg total) by mouth daily. 30 tablet 11  . budesonide-formoterol (SYMBICORT) 160-4.5 MCG/ACT inhaler Inhale 2 puffs into the lungs 2 (two) times daily.    . Cholecalciferol (VITAMIN D) 2000 UNITS tablet Take 2,000 Units by mouth daily.    Marland Kitchen docusate sodium (COLACE) 100 MG capsule Take 100 mg by mouth daily.    Marland Kitchen escitalopram (LEXAPRO) 10 MG tablet Take 10 mg by mouth daily.    . fenofibrate 160 MG tablet Take 160 mg by mouth daily.    . fluticasone (FLONASE) 50 MCG/ACT nasal spray 2 spray by Each Nare route daily.    . furosemide (LASIX) 40 MG tablet TAKE 1 TABLET BY MOUTH ON TUESDAY,AND THURSDAY,SATURDAY,SUNDAY, AND THEN 2 TABLETS ON MONDAY,WEDNESDAY,FRIDAYS. 40 tablet 0  . hydrALAZINE (APRESOLINE) 25 MG tablet Take 25 mg  by mouth 2 (two) times daily.    . isosorbide mononitrate (IMDUR) 60 MG 24 hr tablet Take 1 tablet (60 mg total) by mouth daily. Please keep upcoming appointment for further refills 30 tablet 0  . levothyroxine (SYNTHROID, LEVOTHROID) 100 MCG tablet Take 1 tablet (100 mcg total) by mouth daily. 30 tablet 4  . loratadine (CLARITIN) 10 MG tablet Take 10 mg by mouth every morning.     . nystatin (MYCOSTATIN) powder Apply 1 g topically 3 (three) times daily as needed (itching). Reported on 06/19/2015    . potassium chloride SA (K-DUR,KLOR-CON) 20 MEQ tablet Take 1 tablet (20 mEq total) by mouth every other day. 15 tablet 0   No current facility-administered medications for this visit.      Past Medical History:  Diagnosis Date  . Asthma    Albuterol prn;Symbicort daily  . Atrial fibrillation ([place])    takes COumadin daily  . Bruises easily    d/t being on Coumadin  . CAD (coronary artery disease)    predominantly single vessel  . Cardiomyopathy, ischemic   . Cataracts, bilateral to be removed in Aug 2015  . Chronic kidney disease 1976   S/P nephrectomy  . Constipation    takes Colace daily as needed  . Depression   . 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 Dizziness    occasionally  . GERD (gastroesophageal reflux disease)    takes OTC med if needed  . Gout    takes Allopurinol daily  . Heart failure ([place])    NYHFA     CLASS 3  . History of hyperthyroidism   . Hyperlipidemia    takes Fenofibrate daily  . Hypertension    takes Bisoprolol daily as well as Imdur  . Hypothyroidism    takes Synthroid daily  . Joint pain   . Joint swelling   . Left knee DJD    needs surgery  . Myocardial infarction ([place]) 2007  . OSA (obstructive sleep apnea)    "don't wear mask; can't afford one" (12/20/2013)  . Pacemaker   . Peripheral edema    takes Furosemide daily  . Pneumonia, community acquired last time 2014   "get it ~ q yr; haven't had it yet in  2015" (12/20/2013)  . Seasonal allergies    takes Claritin daily  . Shortness of breath    with exertion  . Solitary kidney 06/05/2006  . Urinary frequency   . Urinary incontinence   . Urinary urgency   . Ventricular tachycardia ([place])    s/p defibrillator-Medtronic EnTrust [id number]    ROS:   All systems reviewed and negative except as noted in the HPI.   Past Surgical History:  Procedure Laterality Date  . CARDIAC CATHETERIZATION  2007   60% Stenosis mid-CFX  . CARDIAC DEFIBRILLATOR PLACEMENT  2007   Medtronic EnTrust [phone]  . CARDIAC DEFIBRILLATOR REMOVAL  12/20/2013  . CARDIOVERSION  06/18/2011   Procedure: CARDIOVERSION;  Surgeon: Kerry Hough., MD;  Location: [place];  Service: Cardiovascular;  Laterality: N/A;  . CARDIOVERSION N/A 12/06/2012   Procedure: CARDIOVERSION;  Surgeon: Jacolyn Reedy, MD;  Location: [place];  Service: Cardiovascular;  Laterality: N/A;  . COLONOSCOPY    . ICD GENERATOR CHANGE  07/2012  . ICD LEAD REMOVAL Left 12/20/2013   Procedure: ICD LEAD REMOVAL//EXTRACTION AND PLACEMENT OF TEMP/PERM ATRIAL LEAD;  Surgeon: Evans Lance, MD;  Location: [place];  Service: Cardiovascular;  Laterality: Left;  . IMPLANTABLE CARDIOVERTER DEFIBRILLATOR IMPLANT N/A 01/25/2014   Procedure: IMPLANTABLE CARDIOVERTER DEFIBRILLATOR IMPLANT;  Surgeon: Evans Lance, MD;  Location: [facility] CATH LAB;  Service: Cardiovascular;  Laterality: N/A;  . IMPLANTABLE CARDIOVERTER DEFIBRILLATOR REVISION N/A 07/13/2012   Procedure: IMPLANTABLE CARDIOVERTER DEFIBRILLATOR REVISION;  Surgeon: Deboraha Sprang, MD;  Location: [facility] CATH LAB;  Service: Cardiovascular;  Laterality: N/A;  . INSERT / REPLACE / REMOVE PACEMAKER  12/20/2013  . KNEE ARTHROSCOPY Left   . MULTIPLE TOOTH EXTRACTIONS     "top's are all gone; took some off the bottom too"  . NEPHRECTOMY  1976   Right  . TEE WITHOUT CARDIOVERSION  07/13/2012   Procedure: TRANSESOPHAGEAL ECHOCARDIOGRAM (TEE);  Surgeon: Lelon Perla, MD;   Location: [facility] ENDOSCOPY;  Service: Cardiovascular;  Laterality: N/A;     Family History  Problem Relation Age of Onset  . Heart disease Other        uncle  . Diabetes Other        uncle     Social History   Socioeconomic History  . Marital status: Single    Spouse name: Not on file  . Number of children: Not on file  . Years of education: Not on file  . Highest education level: Not on file  Social Needs  . Financial resource strain: Not  on file  . Food insecurity - worry: Not on file  . Food insecurity - inability: Not on file  . Transportation needs - medical: Not on file  . Transportation needs - non-medical: Not on file  Occupational History  . Occupation: disabled  Tobacco Use  . Smoking status: Never Smoker  . Smokeless tobacco: Never Used  Substance and Sexual Activity  . Alcohol use: Yes    Comment: 12/20/2013 "might have drank a couple beers before; never really drank"  . Drug use: No  . Sexual activity: No  Other Topics Concern  . Not on file  Social History Narrative   Never married.  Worked previously in [employer].     BP (!) 120/58   Pulse (!) 59   Ht 5\' 5"  (1.651 m)   Wt 207 lb (93.9 kg)   BMI 34.45 kg/m   Physical Exam:  Well appearing 72 yo man, NAD HEENT: Unremarkable Neck:  6 cm JVD, no thyromegally Lymphatics:  No adenopathy Back:  No CVA tenderness Lungs:  Clear with no wheezes HEART:  Regular rate rhythm, no murmurs, no rubs, no clicks Abd:  soft, positive bowel sounds, no organomegally, no rebound, no guarding Ext:  2 plus pulses, 2+ edema on right, 1+on left, no cyanosis, no clubbing Skin:  No rashes no nodules Neuro:  CN II through XII intact, motor grossly intact  DEVICE  Normal device function.  See PaceArt for details.   A/P 1. Chronic systolic heart failure - his symptoms remain class 2. He will continue his current meds.  2. ICD - his medtronic DDD ICD is working normally. Will recheck in several months. 3. VT - he  has had no recurrent VT. He will continue his current meds.  Gregg Taylor,M.D.  Assess/Plan:Patient ID: Pennie Banter, male   DOB: 12-30-1945, 72 y.o.   MRN: [id number]

## 2017-07-30 NOTE — Patient Instructions (Signed)
Medication Instructions:  Your physician recommends that you continue on your current medications as directed. Please refer to the Current Medication list given to you today.  All your cardiac medications were refilled for a year in monthly amounts.  Labwork: None ordered.  Testing/Procedures: None ordered.  Follow-Up:  You will see the device clinic in 3 months for a device check.  Your physician wants you to follow-up in: one year with Dr. Lovena Le.   You will receive a reminder letter in the mail two months in advance. If you don't receive a letter, please call our office to schedule the follow-up appointment.  Any Other Special Instructions Will Be Listed Below (If Applicable).  If you need a refill on your cardiac medications before your next appointment, please call your pharmacy.

## 2017-09-06 ENCOUNTER — Telehealth: Payer: Self-pay | Admitting: Internal Medicine

## 2017-09-06 NOTE — Telephone Encounter (Signed)
New Message   [place]   They D/C the patient  bisoprolol (ZEBETA) 10 MG tablet Take 1 tablet (10 mg total) by mouth daily.   He is not to take anymore beta blockers due to Bradycardia

## 2017-09-10 NOTE — Progress Notes (Signed)
Office note received from Dr. Wynonia Lawman.  Dr. Wynonia Lawman discontinued Pt bisoprolol d/t bradycardia.  Per Dr. Wynonia Lawman, no further beta blockers should be ordered for bradycardia.

## 2017-10-27 ENCOUNTER — Ambulatory Visit (INDEPENDENT_AMBULATORY_CARE_PROVIDER_SITE_OTHER): Payer: Medicare Other | Admitting: *Deleted

## 2017-10-27 DIAGNOSIS — I495 Sick sinus syndrome: Secondary | ICD-10-CM | POA: Diagnosis not present

## 2017-10-27 LAB — CUP PACEART INCLINIC DEVICE CHECK
Battery Voltage: 2.99 V
Brady Statistic AS VP Percent: 4.11 %
Brady Statistic AS VS Percent: 95.89 %
Brady Statistic RA Percent Paced: 0 %
HighPow Impedance: 60 Ohm
Implantable Lead Implant Date: 20150820
Implantable Lead Model: 6935
Lead Channel Impedance Value: 437 Ohm
Lead Channel Pacing Threshold Pulse Width: 0.4 ms
Lead Channel Sensing Intrinsic Amplitude: 0.5 mV
Lead Channel Sensing Intrinsic Amplitude: 1.875 mV
Lead Channel Sensing Intrinsic Amplitude: 11.75 mV
MDC IDC LEAD IMPLANT DT: 20150820
MDC IDC LEAD LOCATION: 753859
MDC IDC LEAD LOCATION: 753860
MDC IDC MSMT BATTERY REMAINING LONGEVITY: 75 mo
MDC IDC MSMT LEADCHNL RA IMPEDANCE VALUE: 4047 Ohm
MDC IDC MSMT LEADCHNL RA PACING THRESHOLD AMPLITUDE: 2.375 V
MDC IDC MSMT LEADCHNL RV IMPEDANCE VALUE: 323 Ohm
MDC IDC MSMT LEADCHNL RV PACING THRESHOLD AMPLITUDE: 0.75 V
MDC IDC MSMT LEADCHNL RV PACING THRESHOLD PULSEWIDTH: 0.4 ms
MDC IDC MSMT LEADCHNL RV SENSING INTR AMPL: 13.375 mV
MDC IDC PG IMPLANT DT: 20150820
MDC IDC SESS DTM: 20190522092217
MDC IDC SET LEADCHNL RV PACING AMPLITUDE: 2.5 V
MDC IDC SET LEADCHNL RV PACING PULSEWIDTH: 0.4 ms
MDC IDC SET LEADCHNL RV SENSING SENSITIVITY: 0.3 mV
MDC IDC STAT BRADY AP VP PERCENT: 0 %
MDC IDC STAT BRADY AP VS PERCENT: 0 %
MDC IDC STAT BRADY RV PERCENT PACED: 4.41 %

## 2017-10-27 NOTE — Progress Notes (Signed)
ICD check in clinic. Normal device function. Threshold and sensing consistent with previous device measurements. Impedance trend stable over time. No evidence of any ventricular arrhythmias. Histogram distribution appropriate for patient and level of activity. No changes made this session. Device programmed at appropriate safety margins. Device programmed to optimize intrinsic conduction. Estimated longevity 6.24yrs. Pt enrolled in remote follow-up. ROV with DC 8/22. Patient additionally follows with Dr. Wynonia Lawman. Talked with patient about following with Dr. Wynonia Lawman only. Patient preferred to continue q33mo with DC.  Patient reported increase in urinary frequency on days that he takes his 80mg  dosage of lasix causing him to have accidents while in public. Will message Dr. Lovena Le for recommendations. Patient agreeable to this plan.

## 2017-10-29 ENCOUNTER — Telehealth: Payer: Self-pay

## 2017-10-29 DIAGNOSIS — I480 Paroxysmal atrial fibrillation: Secondary | ICD-10-CM

## 2017-10-29 NOTE — Telephone Encounter (Signed)
LCM for call back. Okay to change lasix to 40mg  daily.

## 2017-11-02 MED ORDER — FUROSEMIDE 40 MG PO TABS
ORAL_TABLET | ORAL | 11 refills | Status: DC
Start: 2017-11-02 — End: 2018-05-18

## 2017-11-02 NOTE — Telephone Encounter (Signed)
Spoke with Cody Faulkner and explained that Dr. Lovena Le recommended that he change his lasix 40mg  daily. I advised to call back for symptoms of lower extremity swelling, weight gain, or new ShOB.

## 2017-11-03 ENCOUNTER — Encounter: Payer: Medicare Other | Admitting: Internal Medicine

## 2017-11-30 ENCOUNTER — Other Ambulatory Visit: Payer: Self-pay

## 2017-11-30 DIAGNOSIS — R609 Edema, unspecified: Secondary | ICD-10-CM

## 2017-12-01 ENCOUNTER — Ambulatory Visit (HOSPITAL_COMMUNITY)
Admission: RE | Admit: 2017-12-01 | Discharge: 2017-12-01 | Disposition: A | Discharge status: Home / Self Care | Payer: Medicare Other | Source: Ambulatory Visit | Attending: Vascular Surgery | Admitting: Vascular Surgery

## 2017-12-01 DIAGNOSIS — I872 Venous insufficiency (chronic) (peripheral): Secondary | ICD-10-CM | POA: Insufficient documentation

## 2017-12-01 DIAGNOSIS — R609 Edema, unspecified: Secondary | ICD-10-CM | POA: Insufficient documentation

## 2018-02-10 ENCOUNTER — Ambulatory Visit (INDEPENDENT_AMBULATORY_CARE_PROVIDER_SITE_OTHER): Payer: Medicare Other | Admitting: *Deleted

## 2018-02-10 DIAGNOSIS — I5022 Chronic systolic (congestive) heart failure: Secondary | ICD-10-CM | POA: Diagnosis not present

## 2018-02-10 DIAGNOSIS — I420 Dilated cardiomyopathy: Secondary | ICD-10-CM

## 2018-02-10 DIAGNOSIS — I472 Ventricular tachycardia: Secondary | ICD-10-CM

## 2018-02-10 DIAGNOSIS — I4729 Other ventricular tachycardia: Secondary | ICD-10-CM

## 2018-02-10 DIAGNOSIS — Z95810 Presence of automatic (implantable) cardiac defibrillator: Secondary | ICD-10-CM | POA: Diagnosis not present

## 2018-02-10 LAB — CUP PACEART INCLINIC DEVICE CHECK
Battery Remaining Longevity: 61 mo
Battery Voltage: 2.97 V
Brady Statistic AP VP Percent: 0 %
Brady Statistic RA Percent Paced: 0 %
Brady Statistic RV Percent Paced: 0.39 %
Date Time Interrogation Session: 20190905111735
HighPow Impedance: 62 Ohm
Implantable Lead Location: 753859
Implantable Pulse Generator Implant Date: 20150820
Lead Channel Impedance Value: 323 Ohm
Lead Channel Impedance Value: 4047 Ohm
Lead Channel Sensing Intrinsic Amplitude: 1.75 mV
Lead Channel Setting Pacing Amplitude: 2.5 V
MDC IDC LEAD IMPLANT DT: 20150820
MDC IDC LEAD IMPLANT DT: 20150820
MDC IDC LEAD LOCATION: 753860
MDC IDC MSMT LEADCHNL RV IMPEDANCE VALUE: 399 Ohm
MDC IDC MSMT LEADCHNL RV PACING THRESHOLD AMPLITUDE: 1 V
MDC IDC MSMT LEADCHNL RV PACING THRESHOLD PULSEWIDTH: 0.4 ms
MDC IDC MSMT LEADCHNL RV SENSING INTR AMPL: 16.125 mV
MDC IDC SET LEADCHNL RV PACING PULSEWIDTH: 0.4 ms
MDC IDC SET LEADCHNL RV SENSING SENSITIVITY: 0.3 mV
MDC IDC STAT BRADY AP VS PERCENT: 0 %
MDC IDC STAT BRADY AS VP PERCENT: 0.28 %
MDC IDC STAT BRADY AS VS PERCENT: 99.72 %

## 2018-02-10 NOTE — Progress Notes (Signed)
ICD check in clinic. Normal device function. RV threshold, sensing, and impedances consistent with previous device measurements. RA impedance chronically elevated, programmed VVI. No evidence of any ventricular arrhythmias. 1 AT/AF episode (<0.1% burden)--atrial lead noise per EGM, no noise reproducible with isometrics today, RA sensitivity maintained at 0.20mV for discrimination purposes. Histogram distribution appropriate for patient and level of activity. Thoracic impedance trending toward baseline, recently unstable suggesting mild fluid retention from beginning-mid August. Device programmed at appropriate safety margins; max RV pacing impedance increased to 2000ohms per protocol. Device programmed to optimize intrinsic conduction. Estimated longevity 5 years. Patient declines Carelink monitoring. Patient education completed including shock plan. Alert tones demonstrated for patient. ROV with DC on 05/12/18 and ROV with GT in 07/2018.  Patient has been taking 80mg  of furosemide (40mg  in AM, 40mg  in afternoon) on M/W/F and 40mg  once daily on all other days. He feels this keeps his fluid under better control than 40mg  daily. Dose previously decreased from 80mg  on M/W/F due to urinary frequency. Med list updated and note routed to Dr. Lovena Le as Juluis Rainier.

## 2018-03-16 ENCOUNTER — Ambulatory Visit (INDEPENDENT_AMBULATORY_CARE_PROVIDER_SITE_OTHER): Payer: Medicare Other | Admitting: *Deleted

## 2018-03-16 DIAGNOSIS — I420 Dilated cardiomyopathy: Secondary | ICD-10-CM

## 2018-03-16 NOTE — Progress Notes (Signed)
Remote ICD transmission.   

## 2018-04-21 LAB — CUP PACEART REMOTE DEVICE CHECK
Battery Voltage: 2.98 V
Brady Statistic AS VP Percent: 0.12 %
Brady Statistic RA Percent Paced: 0 %
Brady Statistic RV Percent Paced: 0.13 %
HighPow Impedance: 71 Ohm
Implantable Lead Implant Date: 20150820
Implantable Lead Implant Date: 20150820
Implantable Lead Location: 753859
Implantable Lead Location: 753860
Implantable Lead Model: 5076
Lead Channel Impedance Value: 323 Ohm
Lead Channel Impedance Value: 380 Ohm
Lead Channel Pacing Threshold Pulse Width: 0.4 ms
Lead Channel Sensing Intrinsic Amplitude: 0.375 mV
Lead Channel Sensing Intrinsic Amplitude: 11.625 mV
Lead Channel Setting Pacing Pulse Width: 0.4 ms
Lead Channel Setting Sensing Sensitivity: 0.3 mV
MDC IDC MSMT BATTERY REMAINING LONGEVITY: 61 mo
MDC IDC MSMT LEADCHNL RA IMPEDANCE VALUE: 4047 Ohm
MDC IDC MSMT LEADCHNL RA PACING THRESHOLD AMPLITUDE: 2.375 V
MDC IDC MSMT LEADCHNL RA SENSING INTR AMPL: 0.375 mV
MDC IDC MSMT LEADCHNL RV PACING THRESHOLD AMPLITUDE: 0.875 V
MDC IDC MSMT LEADCHNL RV PACING THRESHOLD PULSEWIDTH: 0.4 ms
MDC IDC MSMT LEADCHNL RV SENSING INTR AMPL: 11.625 mV
MDC IDC PG IMPLANT DT: 20150820
MDC IDC SESS DTM: 20191009073523
MDC IDC SET LEADCHNL RV PACING AMPLITUDE: 2.5 V
MDC IDC STAT BRADY AP VP PERCENT: 0 %
MDC IDC STAT BRADY AP VS PERCENT: 0 %
MDC IDC STAT BRADY AS VS PERCENT: 99.88 %

## 2018-05-12 ENCOUNTER — Telehealth: Payer: Self-pay | Admitting: *Deleted

## 2018-05-12 NOTE — Telephone Encounter (Signed)
Spoke with patient and his cousin, Cody Faulkner, as I noted that he has a Carelink monitor that is actively transmitting (previously followed by Dr. Thurman Coyer office and not available for [place] to see). Last remote transmission was processed on 03/16/18. Offered to still see patient today as this is a last-minute cancellation, but patient reports he is okay with cancelling appointment. Next automatic transmission scheduled for 06/15/18. Patient and Cody Faulkner deny questions or concerns at this time.

## 2018-05-17 NOTE — Progress Notes (Signed)
Cardiology Office Note:    Date:  05/18/2018   ID:  ARBER WIEMERS, DOB 11-29-1945, MRN [id number]  PCP:  Cyndi Bender, PA-C  Cardiologist:  Shirlee More, MD    Referring MD: Cyndi Bender, PA-C    ASSESSMENT:    1. Paroxysmal atrial fibrillation (HCC)   2. On amiodarone therapy   3. Long term current use of anticoagulant therapy   4. Automatic implantable cardioverter-defibrillator in situ   5. Chronic combined systolic and diastolic heart failure ([place])   6. Hypertensive heart disease with heart failure ([place])   7. Coronary artery disease of native artery of native heart with stable angina pectoris ([place])   8. CKD (chronic kidney disease) stage 3, GFR 30-59 ml/min (HCC)    PLAN:    In order of problems listed above:  1. Stable remains in sinus rhythm continue low-dose amiodarone anticoagulant and no evidence of toxicity. 2. Continue amiodarone 3. His anticoagulant 4. Stable followed in device clinic he has had no VT VF therapy does not pacer dependent 5. Heart failure is mildly decompensated he is increasingly edematous I asked him to increase his diuretic. 6. Stable continue current treatment not on ACE or arm due to CKD 7. Stable continue medical therapy at this time I would not do an ischemia evaluation 8. Stable CKD labs follow-up in his PCP office 9. Hyperlipidemia stable continue current treatment lipids are ideal without a statin   Next appointment: 6 months   Medication Adjustments/Labs and Tests Ordered: Current medicines are reviewed at length with the patient today.  Concerns regarding medicines are outlined above.  Orders Placed This Encounter  Procedures  . EKG 12-Lead   Meds ordered this encounter  Medications  . furosemide (LASIX) 80 MG tablet    Sig: Take 1 tablet (80 mg total) by mouth daily.    Dispense:  30 tablet    Refill:  6    Chief Complaint  Patient presents with  . Follow-up  . Congestive Heart Failure  . Coronary Artery Disease    . Hypertension    History of Present Illness:    Cody Faulkner is a 72 y.o. male with a hx of CAD, a dilated cardiomyopathy with heart failure with pacemaker/ICD on amiodarone hypertension hyperlipidemia and atrial fibrillation last seen by Dr. Wynonia Lawman 11/29/2017.  He has a history of CAD with 60% LAD stenosis.  LV ejection fraction 60%.  In 2017.  Device download June 2019 [facility] had no ventricular tachycardia ventricular fibrillation most recent lab 08/27/2017 show cholesterol 106 LDL 49 normal liver function the echocardiogram in September 2017 showed an EF of 60% mild left atrial enlargement mild to moderate aortic regurgitation.  The time of his last visit there was notation that he was to have lower extremity venous duplex was performed Compliance with diet, lifestyle and medications: Yes  Chronically is on a plateau he is short of breath ambulating indoors and outdoors leg swelling but no orthopnea chest pain palpitation or syncope remains on amiodarone.  Recent labs done with his PCP 04/05/2018 shows cholesterol 120 LDL 56 creatinine 1.27 hemoglobin 11.7 TSH normal.  He has had no emergency room or urgent doctor evaluations.  He has had no angina Past Medical History:  Diagnosis Date  . Asthma    Albuterol prn;Symbicort daily  . Atrial fibrillation ([place])    takes COumadin daily  . Bruises easily    d/t being on Coumadin  . CAD (coronary artery disease)    predominantly single  vessel  . Cardiomyopathy, ischemic   . Cataracts, bilateral to be removed in Aug 2015  . Chronic kidney disease 1976   S/P nephrectomy  . Constipation    takes Colace daily as needed  . Depression   . Dilated cardiomyopathy ([place])    s/p defibrillator Medtronic [employer]Trust [phone]  . Dizziness    occasionally  . GERD (gastroesophageal reflux disease)    takes OTC med if needed  . Gout    takes Allopurinol daily  . Heart failure ([place])    NYHFA     CLASS 3  . History of hyperthyroidism   . Hyperlipidemia     takes Fenofibrate daily  . Hypertension    takes Bisoprolol daily as well as Imdur  . Hypothyroidism    takes Synthroid daily  . Joint pain   . Joint swelling   . Left knee DJD    needs surgery  . Myocardial infarction ([place]) 2007  . OSA (obstructive sleep apnea)    "don't wear mask; can't afford one" (12/20/2013)  . Pacemaker   . Peripheral edema    takes Furosemide daily  . Pneumonia, community acquired last time 2014   "get it ~ q yr; haven't had it yet in 2015" (12/20/2013)  . Seasonal allergies    takes Claritin daily  . Shortness of breath    with exertion  . Solitary kidney 06/05/2006  . Urinary frequency   . Urinary incontinence   . Urinary urgency   . Ventricular tachycardia [facility])    s/p defibrillator-Medtronic EnTrust [id number]    Past Surgical History:  Procedure Laterality Date  . CARDIAC CATHETERIZATION  2007   60% Stenosis mid-CFX  . CARDIAC DEFIBRILLATOR PLACEMENT  2007   Medtronic EnTrust [phone]  . CARDIAC DEFIBRILLATOR REMOVAL  12/20/2013  . CARDIOVERSION  06/18/2011   Procedure: CARDIOVERSION;  Surgeon: Kerry Hough., MD;  Location: [place];  Service: Cardiovascular;  Laterality: N/A;  . CARDIOVERSION N/A 12/06/2012   Procedure: CARDIOVERSION;  Surgeon: Jacolyn Reedy, MD;  Location: [place];  Service: Cardiovascular;  Laterality: N/A;  . COLONOSCOPY    . ICD GENERATOR CHANGE  07/2012  . ICD LEAD REMOVAL Left 12/20/2013   Procedure: ICD LEAD REMOVAL//EXTRACTION AND PLACEMENT OF TEMP/PERM ATRIAL LEAD;  Surgeon: Evans Lance, MD;  Location: [place];  Service: Cardiovascular;  Laterality: Left;  . IMPLANTABLE CARDIOVERTER DEFIBRILLATOR IMPLANT N/A 01/25/2014   Procedure: IMPLANTABLE CARDIOVERTER DEFIBRILLATOR IMPLANT;  Surgeon: Evans Lance, MD;  Location: [facility] CATH LAB;  Service: Cardiovascular;  Laterality: N/A;  . IMPLANTABLE CARDIOVERTER DEFIBRILLATOR REVISION N/A 07/13/2012   Procedure: IMPLANTABLE CARDIOVERTER DEFIBRILLATOR REVISION;  Surgeon: Deboraha Sprang, MD;  Location: [facility] CATH LAB;  Service: Cardiovascular;  Laterality: N/A;  . INSERT / REPLACE / REMOVE PACEMAKER  12/20/2013  . KNEE ARTHROSCOPY Left   . MULTIPLE TOOTH EXTRACTIONS     "top's are all gone; took some off the bottom too"  . NEPHRECTOMY  1976   Right  . TEE WITHOUT CARDIOVERSION  07/13/2012   Procedure: TRANSESOPHAGEAL ECHOCARDIOGRAM (TEE);  Surgeon: Lelon Perla, MD;  Location: [facility] ENDOSCOPY;  Service: Cardiovascular;  Laterality: N/A;    Current Medications: Current Meds  Medication Sig  . acetaminophen (TYLENOL) 500 MG tablet Take 500 mg by mouth every 6 (six) hours as needed for moderate pain.   Marland Kitchen albuterol (PROVENTIL HFA;VENTOLIN HFA) 108 (90 BASE) MCG/ACT inhaler Inhale 2 puffs into the lungs every 4 (four) hours as needed for shortness of  breath.   . allopurinol (ZYLOPRIM) 100 MG tablet Take 100 mg by mouth daily.   Marland Kitchen amiodarone (PACERONE) 200 MG tablet Take 1 tablet (200 mg total) by mouth daily.  Marland Kitchen apixaban (ELIQUIS) 5 MG TABS tablet TAKE ONE TABLET BY MOUTH TWICE DAILY TO PREVENT STROKE  . budesonide-formoterol (SYMBICORT) 160-4.5 MCG/ACT inhaler Inhale 2 puffs into the lungs 2 (two) times daily.  . Cholecalciferol (VITAMIN D) 2000 UNITS tablet Take 2,000 Units by mouth daily.  Marland Kitchen docusate sodium (COLACE) 100 MG capsule Take 100 mg by mouth daily.  . fenofibrate 160 MG tablet Take 160 mg by mouth daily.  . fluticasone (FLONASE) 50 MCG/ACT nasal spray Place 2 sprays into both nostrils daily as needed.   . furosemide (LASIX) 80 MG tablet Take 1 tablet (80 mg total) by mouth daily.  . hydrALAZINE (APRESOLINE) 25 MG tablet Take 1 tablet (25 mg total) by mouth 2 (two) times daily.  . isosorbide mononitrate (IMDUR) 60 MG 24 hr tablet Take 1 tablet (60 mg total) by mouth daily.  Marland Kitchen loratadine (CLARITIN) 10 MG tablet Take 10 mg by mouth every morning.   . potassium chloride SA (K-DUR,KLOR-CON) 20 MEQ tablet Take 1 tablet (20 mEq total) by mouth every other day.  Marland Kitchen  SYNTHROID 125 MCG tablet Take 125 mcg by mouth daily.  Marland Kitchen triamcinolone cream (KENALOG) 0.1 % APPLY TO AFFECTED AREA TWICE A DAY . MIX WITH 50/50 EUCERIN  . [DISCONTINUED] furosemide (LASIX) 40 MG tablet Take 40mg  Daily (Patient taking differently: Take 40-80 mg by mouth daily. Take 2 tablets (80mg ) daily on Monday, Wednesday, and Friday. Take 1 tablet (40mg ) daily on all other days.)     Allergies:   Methimazole [methimazole]   Social History   Socioeconomic History  . Marital status: Single    Spouse name: Not on file  . Number of children: Not on file  . Years of education: Not on file  . Highest education level: Not on file  Occupational History  . Occupation: disabled  Social Needs  . Financial resource strain: Not on file  . Food insecurity:    Worry: Not on file    Inability: Not on file  . Transportation needs:    Medical: Not on file    Non-medical: Not on file  Tobacco Use  . Smoking status: Never Smoker  . Smokeless tobacco: Never Used  Substance and Sexual Activity  . Alcohol use: Yes    Comment: 12/20/2013 "might have drank a couple beers before; never really drank"  . Drug use: No  . Sexual activity: Never  Lifestyle  . Physical activity:    Days per week: Not on file    Minutes per session: Not on file  . Stress: Not on file  Relationships  . Social connections:    Talks on phone: Not on file    Gets together: Not on file    Attends religious service: Not on file    Active member of club or organization: Not on file    Attends meetings of clubs or organizations: Not on file    Relationship status: Not on file  Other Topics Concern  . Not on file  Social History Narrative   Never married.  Worked previously in [employer].     Family History: The patient's family history includes Diabetes in his other; Heart disease in his other. ROS:   Please see the history of present illness.    All other systems reviewed and are negative.  EKGs/Labs/Other  Studies Reviewed:    The following studies were reviewed today:  EKG:  EKG ordered today.  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  Recent Labs: No results found for requested labs within last 8760 hours.  Recent Lipid Panel No results found for: CHOL, TRIG, HDL, CHOLHDL, VLDL, LDLCALC, LDLDIRECT  Physical Exam:    VS:  BP 136/70 (BP Location: Right Arm, Patient Position: Sitting, Cuff Size: Large)   Pulse (!) 51   Ht 5\' 5"  (1.651 m)   Wt 200 lb 6.4 oz (90.9 kg)   SpO2 97%   BMI 33.35 kg/m     Wt Readings from Last 3 Encounters:  05/18/18 200 lb 6.4 oz (90.9 kg)  07/30/17 207 lb (93.9 kg)  06/23/16 229 lb (103.9 kg)     GEN: He looks frail struggling to ambulate in the office COPD appearance well nourished, well developed in no acute distress HEENT: Normal NECK: No JVD; No carotid bruits LYMPHATICS: No lymphadenopathy CARDIAC: Soft S1 no S3 RRR, no murmurs, rubs, gallops RESPIRATORY: Loosely decreased breath sounds ABDOMEN: Soft, non-tender, non-distended MUSCULOSKELETAL: 3+ to the knee bilateral edema; No deformity  SKIN: Warm and dry NEUROLOGIC:  Alert and oriented x 3 PSYCHIATRIC:  Normal affect    Signed, Shirlee More, MD  05/18/2018 1:39 PM    [facility]

## 2018-05-18 ENCOUNTER — Encounter: Payer: Self-pay | Admitting: Cardiology

## 2018-05-18 ENCOUNTER — Ambulatory Visit: Payer: Medicare Other | Admitting: Cardiology

## 2018-05-18 VITALS — BP 136/70 | HR 51 | Ht 65.0 in | Wt 200.4 lb

## 2018-05-18 DIAGNOSIS — N183 Chronic kidney disease, stage 3 unspecified: Secondary | ICD-10-CM

## 2018-05-18 DIAGNOSIS — Z79899 Other long term (current) drug therapy: Secondary | ICD-10-CM | POA: Diagnosis not present

## 2018-05-18 DIAGNOSIS — Z7901 Long term (current) use of anticoagulants: Secondary | ICD-10-CM

## 2018-05-18 DIAGNOSIS — I5042 Chronic combined systolic (congestive) and diastolic (congestive) heart failure: Secondary | ICD-10-CM

## 2018-05-18 DIAGNOSIS — I480 Paroxysmal atrial fibrillation: Secondary | ICD-10-CM | POA: Diagnosis not present

## 2018-05-18 DIAGNOSIS — I25118 Atherosclerotic heart disease of native coronary artery with other forms of angina pectoris: Secondary | ICD-10-CM

## 2018-05-18 DIAGNOSIS — I110 Hypertensive heart disease with heart failure: Secondary | ICD-10-CM

## 2018-05-18 DIAGNOSIS — Z95810 Presence of automatic (implantable) cardiac defibrillator: Secondary | ICD-10-CM | POA: Diagnosis not present

## 2018-05-18 MED ORDER — FUROSEMIDE 80 MG PO TABS: 80.0000 mg | ORAL_TABLET | Freq: Every day | ORAL | 6 refills | Status: DC

## 2018-05-18 NOTE — Patient Instructions (Signed)
Medication Instructions:  Your physician has recommended you make the following change in your medication:   INCREASE furosemide (lasix) 80 mg: Take 1 tablet daily when you pick up your new prescription. Until then, take 2 40 mg tablets daily.   If you need a refill on your cardiac medications before your next appointment, please call your pharmacy.   Lab work: None  If you have labs (blood work) drawn today and your tests are completely normal, you will receive your results only by: Marland Kitchen MyChart Message (if you have MyChart) OR . A paper copy in the mail If you have any lab test that is abnormal or we need to change your treatment, we will call you to review the results.  Testing/Procedures: You had an EKG today.   Follow-Up: At [facility], you and your health needs are our priority.  As part of our continuing mission to provide you with exceptional heart care, we have created designated Provider Care Teams.  These Care Teams include your primary Cardiologist (physician) and Advanced Practice Providers (APPs -  Physician Assistants and Nurse Practitioners) who all work together to provide you with the care you need, when you need it. You will need a follow up appointment in 6 months.  Please call our office 2 months in advance to schedule this appointment.

## 2018-05-30 ENCOUNTER — Observation Stay (HOSPITAL_COMMUNITY)
Admission: EM | Admit: 2018-05-30 | Discharge: 2018-05-31 | Disposition: A | Discharge status: Home / Self Care | Payer: Medicare Other | Attending: Internal Medicine | Admitting: Internal Medicine

## 2018-05-30 ENCOUNTER — Encounter (HOSPITAL_COMMUNITY): Payer: Self-pay | Admitting: General Practice

## 2018-05-30 ENCOUNTER — Other Ambulatory Visit: Payer: Self-pay

## 2018-05-30 ENCOUNTER — Emergency Department (HOSPITAL_COMMUNITY): Payer: Medicare Other

## 2018-05-30 DIAGNOSIS — Z79899 Other long term (current) drug therapy: Secondary | ICD-10-CM | POA: Insufficient documentation

## 2018-05-30 DIAGNOSIS — I130 Hypertensive heart and chronic kidney disease with heart failure and stage 1 through stage 4 chronic kidney disease, or unspecified chronic kidney disease: Secondary | ICD-10-CM | POA: Diagnosis not present

## 2018-05-30 DIAGNOSIS — I480 Paroxysmal atrial fibrillation: Secondary | ICD-10-CM | POA: Diagnosis not present

## 2018-05-30 DIAGNOSIS — I5032 Chronic diastolic (congestive) heart failure: Secondary | ICD-10-CM | POA: Diagnosis present

## 2018-05-30 DIAGNOSIS — R918 Other nonspecific abnormal finding of lung field: Secondary | ICD-10-CM | POA: Diagnosis not present

## 2018-05-30 DIAGNOSIS — Z8701 Personal history of pneumonia (recurrent): Secondary | ICD-10-CM

## 2018-05-30 DIAGNOSIS — G9340 Encephalopathy, unspecified: Principal | ICD-10-CM | POA: Insufficient documentation

## 2018-05-30 DIAGNOSIS — I420 Dilated cardiomyopathy: Secondary | ICD-10-CM | POA: Insufficient documentation

## 2018-05-30 DIAGNOSIS — I082 Rheumatic disorders of both aortic and tricuspid valves: Secondary | ICD-10-CM | POA: Diagnosis not present

## 2018-05-30 DIAGNOSIS — I878 Other specified disorders of veins: Secondary | ICD-10-CM

## 2018-05-30 DIAGNOSIS — I2510 Atherosclerotic heart disease of native coronary artery without angina pectoris: Secondary | ICD-10-CM | POA: Diagnosis not present

## 2018-05-30 DIAGNOSIS — I255 Ischemic cardiomyopathy: Secondary | ICD-10-CM | POA: Insufficient documentation

## 2018-05-30 DIAGNOSIS — Z7901 Long term (current) use of anticoagulants: Secondary | ICD-10-CM | POA: Insufficient documentation

## 2018-05-30 DIAGNOSIS — I252 Old myocardial infarction: Secondary | ICD-10-CM | POA: Insufficient documentation

## 2018-05-30 DIAGNOSIS — D649 Anemia, unspecified: Secondary | ICD-10-CM

## 2018-05-30 DIAGNOSIS — I444 Left anterior fascicular block: Secondary | ICD-10-CM | POA: Diagnosis not present

## 2018-05-30 DIAGNOSIS — Z905 Acquired absence of kidney: Secondary | ICD-10-CM | POA: Insufficient documentation

## 2018-05-30 DIAGNOSIS — E079 Disorder of thyroid, unspecified: Secondary | ICD-10-CM | POA: Diagnosis not present

## 2018-05-30 DIAGNOSIS — D631 Anemia in chronic kidney disease: Secondary | ICD-10-CM | POA: Diagnosis not present

## 2018-05-30 DIAGNOSIS — N183 Chronic kidney disease, stage 3 (moderate): Secondary | ICD-10-CM | POA: Insufficient documentation

## 2018-05-30 DIAGNOSIS — G4733 Obstructive sleep apnea (adult) (pediatric): Secondary | ICD-10-CM | POA: Diagnosis not present

## 2018-05-30 DIAGNOSIS — I440 Atrioventricular block, first degree: Secondary | ICD-10-CM | POA: Insufficient documentation

## 2018-05-30 DIAGNOSIS — F329 Major depressive disorder, single episode, unspecified: Secondary | ICD-10-CM | POA: Insufficient documentation

## 2018-05-30 DIAGNOSIS — E785 Hyperlipidemia, unspecified: Secondary | ICD-10-CM | POA: Insufficient documentation

## 2018-05-30 DIAGNOSIS — J189 Pneumonia, unspecified organism: Secondary | ICD-10-CM | POA: Diagnosis present

## 2018-05-30 DIAGNOSIS — Z888 Allergy status to other drugs, medicaments and biological substances status: Secondary | ICD-10-CM | POA: Insufficient documentation

## 2018-05-30 DIAGNOSIS — K219 Gastro-esophageal reflux disease without esophagitis: Secondary | ICD-10-CM | POA: Diagnosis not present

## 2018-05-30 DIAGNOSIS — I5042 Chronic combined systolic (congestive) and diastolic (congestive) heart failure: Secondary | ICD-10-CM | POA: Diagnosis not present

## 2018-05-30 DIAGNOSIS — Z7951 Long term (current) use of inhaled steroids: Secondary | ICD-10-CM | POA: Diagnosis not present

## 2018-05-30 DIAGNOSIS — M109 Gout, unspecified: Secondary | ICD-10-CM | POA: Diagnosis not present

## 2018-05-30 DIAGNOSIS — R410 Disorientation, unspecified: Secondary | ICD-10-CM | POA: Diagnosis present

## 2018-05-30 DIAGNOSIS — Z79890 Hormone replacement therapy (postmenopausal): Secondary | ICD-10-CM

## 2018-05-30 DIAGNOSIS — Z950 Presence of cardiac pacemaker: Secondary | ICD-10-CM | POA: Diagnosis not present

## 2018-05-30 DIAGNOSIS — E039 Hypothyroidism, unspecified: Secondary | ICD-10-CM | POA: Insufficient documentation

## 2018-05-30 DIAGNOSIS — Z8249 Family history of ischemic heart disease and other diseases of the circulatory system: Secondary | ICD-10-CM | POA: Insufficient documentation

## 2018-05-30 LAB — URINALYSIS, ROUTINE W REFLEX MICROSCOPIC
Bilirubin Urine: NEGATIVE
Glucose, UA: NEGATIVE mg/dL
Hgb urine dipstick: NEGATIVE
KETONES UR: NEGATIVE mg/dL
Leukocytes, UA: NEGATIVE
Nitrite: NEGATIVE
Protein, ur: NEGATIVE mg/dL
Specific Gravity, Urine: 1.01 (ref 1.005–1.030)
pH: 7 (ref 5.0–8.0)

## 2018-05-30 LAB — CBC WITH DIFFERENTIAL/PLATELET
Abs Immature Granulocytes: 0.03 10*3/uL (ref 0.00–0.07)
Basophils Absolute: 0 10*3/uL (ref 0.0–0.1)
Basophils Relative: 0 %
EOS ABS: 0.2 10*3/uL (ref 0.0–0.5)
Eosinophils Relative: 3 %
HCT: 34.8 % — ABNORMAL LOW (ref 39.0–52.0)
Hemoglobin: 11.1 g/dL — ABNORMAL LOW (ref 13.0–17.0)
Immature Granulocytes: 1 %
Lymphocytes Relative: 15 %
Lymphs Abs: 0.8 10*3/uL (ref 0.7–4.0)
MCH: 29 pg (ref 26.0–34.0)
MCHC: 31.9 g/dL (ref 30.0–36.0)
MCV: 90.9 fL (ref 80.0–100.0)
Monocytes Absolute: 0.7 10*3/uL (ref 0.1–1.0)
Monocytes Relative: 14 %
Neutro Abs: 3.4 10*3/uL (ref 1.7–7.7)
Neutrophils Relative %: 67 %
Platelets: 229 10*3/uL (ref 150–400)
RBC: 3.83 MIL/uL — ABNORMAL LOW (ref 4.22–5.81)
RDW: 14.3 % (ref 11.5–15.5)
WBC: 5.1 10*3/uL (ref 4.0–10.5)
nRBC: 0 % (ref 0.0–0.2)

## 2018-05-30 LAB — PROTIME-INR
INR: 1.33
Prothrombin Time: 16.3 s — ABNORMAL HIGH (ref 11.4–15.2)

## 2018-05-30 LAB — RAPID URINE DRUG SCREEN, HOSP PERFORMED
Amphetamines: NOT DETECTED
Barbiturates: NOT DETECTED
Benzodiazepines: NOT DETECTED
Cocaine: NOT DETECTED
Opiates: NOT DETECTED
Tetrahydrocannabinol: NOT DETECTED

## 2018-05-30 LAB — COMPREHENSIVE METABOLIC PANEL WITH GFR
ALT: 15 U/L (ref 0–44)
AST: 21 U/L (ref 15–41)
Albumin: 3.5 g/dL (ref 3.5–5.0)
Alkaline Phosphatase: 43 U/L (ref 38–126)
Anion gap: 9 (ref 5–15)
BUN: 27 mg/dL — ABNORMAL HIGH (ref 8–23)
CO2: 22 mmol/L (ref 22–32)
Calcium: 9.5 mg/dL (ref 8.9–10.3)
Chloride: 111 mmol/L (ref 98–111)
Creatinine, Ser: 1.5 mg/dL — ABNORMAL HIGH (ref 0.61–1.24)
GFR calc Af Amer: 52 mL/min — ABNORMAL LOW
GFR calc non Af Amer: 45 mL/min — ABNORMAL LOW
Glucose, Bld: 98 mg/dL (ref 70–99)
Potassium: 3.7 mmol/L (ref 3.5–5.1)
Sodium: 142 mmol/L (ref 135–145)
Total Bilirubin: 1.3 mg/dL — ABNORMAL HIGH (ref 0.3–1.2)
Total Protein: 6.3 g/dL — ABNORMAL LOW (ref 6.5–8.1)

## 2018-05-30 LAB — TSH: TSH: 3.185 u[IU]/mL (ref 0.350–4.500)

## 2018-05-30 LAB — TROPONIN I: TROPONIN I: 0.03 ng/mL — AB (ref <0.03)

## 2018-05-30 MED ORDER — SODIUM CHLORIDE 0.9 % IV SOLN
1.0000 g | Freq: Every day | INTRAVENOUS | Status: DC
  Administered 2018-05-31: 1 g via INTRAVENOUS
  Filled 2018-05-30: qty 10

## 2018-05-30 MED ORDER — ENOXAPARIN SODIUM 40 MG/0.4ML ~~LOC~~ SOLN: 40.0000 mg | SUBCUTANEOUS | Status: DC

## 2018-05-30 MED ORDER — PROMETHAZINE HCL 25 MG PO TABS: 12.5000 mg | ORAL_TABLET | Freq: Four times a day (QID) | ORAL | Status: DC | PRN

## 2018-05-30 MED ORDER — SODIUM CHLORIDE 0.9 % IV SOLN
500.0000 mg | Freq: Every day | INTRAVENOUS | Status: DC
  Administered 2018-05-31: 500 mg via INTRAVENOUS
  Filled 2018-05-30: qty 500

## 2018-05-30 MED ORDER — FENOFIBRATE 160 MG PO TABS
160.0000 mg | ORAL_TABLET | Freq: Every day | ORAL | Status: DC
  Administered 2018-05-31: 160 mg via ORAL
  Filled 2018-05-30: qty 1

## 2018-05-30 MED ORDER — SODIUM CHLORIDE 0.9 % IV SOLN
1.0000 g | Freq: Once | INTRAVENOUS | Status: AC
  Administered 2018-05-30: 1 g via INTRAVENOUS
  Filled 2018-05-30: qty 10

## 2018-05-30 MED ORDER — SODIUM CHLORIDE 0.9 % IV BOLUS
500.0000 mL | Freq: Once | INTRAVENOUS | Status: AC
  Administered 2018-05-30: 500 mL via INTRAVENOUS

## 2018-05-30 MED ORDER — FLUTICASONE FUROATE-VILANTEROL 200-25 MCG/INH IN AEPB
1.0000 | INHALATION_SPRAY | Freq: Every day | RESPIRATORY_TRACT | Status: DC
  Administered 2018-05-31: 1 via RESPIRATORY_TRACT
  Filled 2018-05-30: qty 28

## 2018-05-30 MED ORDER — APIXABAN 5 MG PO TABS
5.0000 mg | ORAL_TABLET | Freq: Two times a day (BID) | ORAL | Status: DC
  Administered 2018-05-30 – 2018-05-31 (×2): 5 mg via ORAL
  Filled 2018-05-30 (×2): qty 1

## 2018-05-30 MED ORDER — VITAMIN D 25 MCG (1000 UNIT) PO TABS
2000.0000 [IU] | ORAL_TABLET | Freq: Every day | ORAL | Status: DC
  Administered 2018-05-30 – 2018-05-31 (×2): 2000 [IU] via ORAL
  Filled 2018-05-30 (×2): qty 2

## 2018-05-30 MED ORDER — SENNOSIDES-DOCUSATE SODIUM 8.6-50 MG PO TABS: 1.0000 | ORAL_TABLET | Freq: Every evening | ORAL | Status: DC | PRN

## 2018-05-30 MED ORDER — HYDRALAZINE HCL 25 MG PO TABS
25.0000 mg | ORAL_TABLET | Freq: Two times a day (BID) | ORAL | Status: DC
  Administered 2018-05-30 – 2018-05-31 (×2): 25 mg via ORAL
  Filled 2018-05-30 (×2): qty 1

## 2018-05-30 MED ORDER — FUROSEMIDE 20 MG PO TABS
80.0000 mg | ORAL_TABLET | Freq: Every day | ORAL | Status: DC
  Administered 2018-05-31: 80 mg via ORAL
  Filled 2018-05-30: qty 4

## 2018-05-30 MED ORDER — AMIODARONE HCL 200 MG PO TABS
200.0000 mg | ORAL_TABLET | Freq: Every day | ORAL | Status: DC
  Administered 2018-05-31: 200 mg via ORAL
  Filled 2018-05-30: qty 1

## 2018-05-30 MED ORDER — ACETAMINOPHEN 325 MG PO TABS: 650.0000 mg | ORAL_TABLET | Freq: Four times a day (QID) | ORAL | Status: DC | PRN

## 2018-05-30 MED ORDER — ISOSORBIDE MONONITRATE ER 30 MG PO TB24
60.0000 mg | ORAL_TABLET | Freq: Every day | ORAL | Status: DC
  Administered 2018-05-31: 60 mg via ORAL
  Filled 2018-05-30: qty 2

## 2018-05-30 MED ORDER — SODIUM CHLORIDE 0.9 % IV SOLN
500.0000 mg | Freq: Once | INTRAVENOUS | Status: AC
  Administered 2018-05-30: 500 mg via INTRAVENOUS
  Filled 2018-05-30: qty 500

## 2018-05-30 MED ORDER — ACETAMINOPHEN 650 MG RE SUPP: 650.0000 mg | Freq: Four times a day (QID) | RECTAL | Status: DC | PRN

## 2018-05-30 MED ORDER — LEVOTHYROXINE SODIUM 125 MCG PO TABS
125.0000 ug | ORAL_TABLET | Freq: Every day | ORAL | Status: DC
  Administered 2018-05-31: 125 ug via ORAL
  Filled 2018-05-30: qty 1

## 2018-05-30 NOTE — ED Notes (Signed)
Attempted to call report

## 2018-05-30 NOTE — ED Notes (Signed)
Hospitalist at bedside 

## 2018-05-30 NOTE — ED Provider Notes (Signed)
Emergency Department Provider Note   I have reviewed the triage vital signs and the nursing notes.   HISTORY  Chief Complaint Altered Mental Status   HPI Cody Faulkner is a 72 y.o. male with PMH of asthma, A-fib on Coumadin, CAD, CHF, HLD, OSA, and recent PNA diagnosis 3 days prior at [facility] presents to the emergency department with increased confusion at home with continued cough and mild hemoptysis.  3 days ago the patient was diagnosed at an urgent care with pneumonia.  He was started on Levaquin.  Patient family states that since diagnosis he has developed worsening confusion.  The patient has been thinking that someone is hurting his dogs and reportedly seeing things that are not present.  Family states that he has been more aggressive.  No prior history of similar symptoms.  Patient unsure if his cough and shortness of breath symptoms have improved.  He denies headache, chest pain, abdominal pain.  He did have some diarrhea early in his antibiotic course but that has mostly resolved.   Past Medical History:  Diagnosis Date  . Asthma    Albuterol prn;Symbicort daily  . Atrial fibrillation ([place])    takes COumadin daily  . Bruises easily    d/t being on Coumadin  . CAD (coronary artery disease)    predominantly single vessel  . Cardiomyopathy, ischemic   . Cataracts, bilateral to be removed in Aug 2015  . Chronic kidney disease 1976   S/P nephrectomy  . Constipation    takes Colace daily as needed  . Depression   . Dilated cardiomyopathy ([place])    s/p defibrillator [employer] [phone]  . Dizziness    occasionally  . GERD (gastroesophageal reflux disease)    takes OTC med if needed  . Gout    takes Allopurinol daily  . Heart failure ([place])    NYHFA     CLASS 3  . History of hyperthyroidism   . Hyperlipidemia    takes Fenofibrate daily  . Hypertension    takes Bisoprolol daily as well as Imdur  . Hypothyroidism    takes Synthroid daily  . Joint pain   . Joint  swelling   . Left knee DJD    needs surgery  . Myocardial infarction ([place]) 2007  . OSA (obstructive sleep apnea)    "don't wear mask; can't afford one" (12/20/2013)  . Pacemaker   . Peripheral edema    takes Furosemide daily  . Pneumonia, community acquired last time 2014   "get it ~ q yr; haven't had it yet in 2015" (12/20/2013)  . Seasonal allergies    takes Claritin daily  . Shortness of breath    with exertion  . Solitary kidney 06/05/2006  . Urinary frequency   . Urinary incontinence   . Urinary urgency   . Ventricular tachycardia [facility])    s/p defibrillator-[employer] [phone]    Patient Active Problem List   Diagnosis Date Noted  . Delirium 05/30/2018  . On amiodarone therapy 05/18/2018  . Chronic anticoagulation- Coumadin 12/23/2013  . Sinus node dysfunction ([place]) 12/21/2013  . Paroxysmal ventricular tachycardia ([place]) 12/21/2013  . ICD (implantable cardioverter-defibrillator) lead failure 12/20/2013  . Obesity hypoventilation syndrome ([place]) 12/07/2012  . History of hyperthyroidism   . CKD (chronic kidney disease) stage 3, GFR 30-59 ml/min (HCC) 06/23/2011  . Morbid obesity   .  term current use of anticoagulant therapy   . Hypertensive heart disease with heart failure ([place])   . Solitary  kidney   . Hypothyroidism   . Mild persistent asthma   . Paroxysmal atrial fibrillation (HCC)   . Automatic implantable cardioverter-defibrillator in situ   . History of dilated cardiomyopathy- last EF 50-55% 2D July 2014   . Chronic combined systolic and diastolic heart failure ([place])   . CAD nonobstructive- 2007 11/12/2005    Past Surgical History:  Procedure Laterality Date  . CARDIAC CATHETERIZATION  2007   60% Stenosis mid-CFX  . CARDIAC DEFIBRILLATOR PLACEMENT  2007   [employer] [phone]  . CARDIAC DEFIBRILLATOR REMOVAL  12/20/2013  . CARDIOVERSION  06/18/2011   Procedure: CARDIOVERSION;  Surgeon: Kerry Hough., MD;  Location: [place];  Service:  Cardiovascular;  Laterality: N/A;  . CARDIOVERSION N/A 12/06/2012   Procedure: CARDIOVERSION;  Surgeon: Jacolyn Reedy, MD;  Location: [place];  Service: Cardiovascular;  Laterality: N/A;  . COLONOSCOPY    . ICD GENERATOR CHANGE  07/2012  . ICD LEAD REMOVAL Left 12/20/2013   Procedure: ICD LEAD REMOVAL//EXTRACTION AND PLACEMENT OF TEMP/PERM ATRIAL LEAD;  Surgeon: Evans Lance, MD;  Location: [place];  Service: Cardiovascular;  Laterality: Left;  . IMPLANTABLE CARDIOVERTER DEFIBRILLATOR IMPLANT N/A 01/25/2014   Procedure: IMPLANTABLE CARDIOVERTER DEFIBRILLATOR IMPLANT;  Surgeon: Evans Lance, MD;  Location: [facility] CATH LAB;  Service: Cardiovascular;  Laterality: N/A;  . IMPLANTABLE CARDIOVERTER DEFIBRILLATOR REVISION N/A 07/13/2012   Procedure: IMPLANTABLE CARDIOVERTER DEFIBRILLATOR REVISION;  Surgeon: Deboraha Sprang, MD;  Location: [facility] CATH LAB;  Service: Cardiovascular;  Laterality: N/A;  . INSERT / REPLACE / REMOVE PACEMAKER  12/20/2013  . KNEE ARTHROSCOPY Left   . MULTIPLE TOOTH EXTRACTIONS     "top's are all gone; took some off the bottom too"  . NEPHRECTOMY  1976   Right  . TEE WITHOUT CARDIOVERSION  07/13/2012   Procedure: TRANSESOPHAGEAL ECHOCARDIOGRAM (TEE);  Surgeon: Lelon Perla, MD;  Location: [facility] ENDOSCOPY;  Service: Cardiovascular;  Laterality: N/A;   Allergies Methimazole [methimazole]  Family History  Problem Relation Age of Onset  . Heart disease Other        uncle  . Diabetes Other        uncle    Social History Social History   Tobacco Use  . Smoking status: Never Smoker  . Smokeless tobacco: Never Used  Substance Use Topics  . Alcohol use: Yes    Comment: 12/20/2013 "might have drank a couple beers before; never really drank"  . Drug use: No    Review of Systems  Constitutional: No fever/chills. Positive confusion and agitation.  Eyes: No visual changes. ENT: No sore throat. Cardiovascular: Denies chest pain. Respiratory: Positive shortness of breath  and cough.  Gastrointestinal: No abdominal pain.  No nausea, no vomiting. Positive diarrhea.  No constipation. Genitourinary: Negative for dysuria. Musculoskeletal: Negative for back pain. Skin: Negative for rash. Neurological: Negative for headaches, focal weakness or numbness.  10-point ROS otherwise negative.  ____________________________________________   PHYSICAL EXAM:  VITAL SIGNS: ED Triage Vitals  Enc Vitals Group     BP 05/30/18 0938 113/65     Pulse Rate 05/30/18 0938 67     Resp 05/30/18 0938 12     Temp 05/30/18 0938 98.2 F (36.8 C)     Temp Source 05/30/18 0938 Oral     SpO2 05/30/18 0938 99 %     Weight 05/30/18 0940 200 lb 6.4 oz (90.9 kg)     Height 05/30/18 0940 5\' 5"  (1.651 m)   Constitutional: Alert and oriented. Well  appearing and in no acute distress. Eyes: Conjunctivae are normal.  Head: Atraumatic. Nose: No congestion/rhinnorhea. Mouth/Throat: Mucous membranes are moist. Neck: No stridor.  Cardiovascular: Normal rate, regular rhythm. Good peripheral circulation. Grossly normal heart sounds.   Respiratory: Normal respiratory effort.  No retractions. Lungs CTAB. Gastrointestinal: Soft and nontender. No distention.  Musculoskeletal: No lower extremity tenderness nor edema. No gross deformities of extremities. Neurologic:  Normal speech and language. No gross focal neurologic deficits are appreciated.  Skin:  Skin is warm, dry and intact. No rash noted.  ____________________________________________   LABS (all labs ordered are listed, but only abnormal results are displayed)  Labs Reviewed  COMPREHENSIVE METABOLIC PANEL - Abnormal; Notable for the following components:      Result Value   BUN 27 (*)    Creatinine, Ser 1.50 (*)    Total Protein 6.3 (*)    Total Bilirubin 1.3 (*)    GFR calc non Af Amer 45 (*)    GFR calc Af Amer 52 (*)    All other components within normal limits  TROPONIN I - Abnormal; Notable for the following components:    Troponin I 0.03 (*)    All other components within normal limits  CBC WITH DIFFERENTIAL/PLATELET - Abnormal; Notable for the following components:   RBC 3.83 (*)    Hemoglobin 11.1 (*)    HCT 34.8 (*)    All other components within normal limits  PROTIME-INR - Abnormal; Notable for the following components:   Prothrombin Time 16.3 (*)    All other components within normal limits  URINALYSIS, ROUTINE W REFLEX MICROSCOPIC  TSH  RAPID URINE DRUG SCREEN, HOSP PERFORMED   ____________________________________________  EKG   EKG Interpretation  Date/Time:  Monday May 30 2018 11:39:46 EST Ventricular Rate:  62 PR Interval:    QRS Duration: 106 QT Interval:  436 QTC Calculation: 443 R Axis:   -85 Text Interpretation:  Sinus rhythm Prolonged PR interval Left anterior fascicular block Abnormal R-wave progression, late transition No STEMI.  Confirmed by Nanda Quinton [phone]) on 05/30/2018 4:59:09 PM       ____________________________________________  RADIOLOGY  Dg Chest 2 View  Result Date: 05/30/2018 CLINICAL DATA:  Shortness of breath EXAM: CHEST - 2 VIEW COMPARISON:  02/24/2016 FINDINGS: There is mild bilateral chronic interstitial thickening. There is left lower lobe hazy airspace disease which may reflect atelectasis versus pneumonia. There is no pleural effusion or pneumothorax. There is stable cardiomegaly. 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 The osseous structures are unremarkable. IMPRESSION: Left lower lobe hazy airspace disease which may reflect atelectasis versus pneumonia. Electronically Signed   By: Kathreen Devoid   On: 05/30/2018 11:03    ____________________________________________   PROCEDURES  Procedure(s) performed:   Procedures  None ____________________________________________   INITIAL IMPRESSION / ASSESSMENT AND PLAN / ED COURSE  Pertinent labs & imaging results that were available during my care of the patient were reviewed by me and  considered in my medical decision making (see chart for details).  Patient presents to the emergency department for evaluation of confusion, agitation, and diarrhea since diagnosis with pneumonia 3 days ago.  Patient was started on Levaquin from urgent care.  Has increased diuresis at home in conjunction with his cardiology team within the past 1 to 2 weeks but otherwise no acute medication changes.  Plan for chest x-ray, labs, and reassessment.  Patient is awake and alert.  Noncombative with me.   CXR with infiltrate. No acute lab abnormalities. PSI  score of 114. Patient with moderate PNA risk. Plan for Rocephin/Azithromycin for PNA and admit with AMS at home since starting treatment.   Discussed patient's case with Family Medicine to request admission. Patient and family (if present) updated with plan. Care transferred to [facility] Medicine service.  I reviewed all nursing notes, vitals, pertinent old records, EKGs, labs, imaging (as available).  ____________________________________________  FINAL CLINICAL IMPRESSION(S) / ED DIAGNOSES  Final diagnoses:  Delirium  Community acquired pneumonia of left lung, unspecified part of lung     MEDICATIONS GIVEN DURING THIS VISIT:  Medications  acetaminophen (TYLENOL) tablet 650 mg (has no administration in time range)    Or  acetaminophen (TYLENOL) suppository 650 mg (has no administration in time range)  senna-docusate (Senokot-S) tablet 1 tablet (has no administration in time range)  promethazine (PHENERGAN) tablet 12.5 mg (has no administration in time range)  amiodarone (PACERONE) tablet 200 mg (has no administration in time range)  apixaban (ELIQUIS) tablet 5 mg (has no administration in time range)  cholecalciferol (VITAMIN D3) tablet 2,000 Units (has no administration in time range)  fluticasone furoate-vilanterol (BREO ELLIPTA) 200-25 MCG/INH 1 puff (has no administration in time range)  fenofibrate tablet 160 mg (has no administration  in time range)  isosorbide mononitrate (IMDUR) 24 hr tablet 60 mg (has no administration in time range)  levothyroxine (SYNTHROID, LEVOTHROID) tablet 125 mcg (has no administration in time range)  furosemide (LASIX) tablet 80 mg (has no administration in time range)  hydrALAZINE (APRESOLINE) tablet 25 mg (has no administration in time range)  sodium chloride 0.9 % bolus 500 mL (0 mLs Intravenous Stopped 05/30/18 1255)  cefTRIAXone (ROCEPHIN) 1 g in sodium chloride 0.9 % 100 mL IVPB (0 g Intravenous Stopped 05/30/18 1345)  azithromycin (ZITHROMAX) 500 mg in sodium chloride 0.9 % 250 mL IVPB (500 mg Intravenous New Bag/Given 05/30/18 1330)    Note:  This document was prepared using Dragon voice recognition software and may include unintentional dictation errors.  Nanda Quinton, MD Emergency Medicine    , [facility], MD 05/30/18 1700

## 2018-05-30 NOTE — H&P (Addendum)
Date: 05/30/2018               Patient Name:  Cody Faulkner MRN: [id number]  DOB: 12-16-1945 Age / Sex: 72 y.o., male   PCP: Cyndi Bender, PA-C         Medical Service: Internal Medicine Teaching Service         Attending Physician: Dr. Rebeca Alert Raynaldo Opitz, MD    First Contact: Dr. Laural Golden Pager: [phone]  Second Contact: Dr. Maricela Bo Pager: [phone]       After Hours (After 5p/  First Contact Pager: [phone]  weekends / holidays): Second Contact Pager: [phone]   Chief Complaint: Altered mental status  History of Present Illness: Cody Faulkner is a 72 y.o male with a PMHx of PAF on Coumadin, chronic combined systolic and diastolic heart failure, CAD, CKD stage 3, OSA and HLD presenting with altered mental status. He went to his pcp's office on Friday 12/20 due to him having bloody streaks in his cough starting Wednesday. He was unable to see his physician, but saw a PA who diagnosed him with pneumonia and prescribed him levofloxacin. He denied any fevrs. He said his cough was no worse than usual and denies SOB; endorsed chest pain, chills, heacaches and heart pain. His cousin noticed he started having hallucinations later on Friday or early Saturday. He reported that the patient was saying someone wanted to shoot/kill him. He also said in the past he has had hallucinations when he had pneumonia. His cousin took him to urgent care on Saturday due to the hallucinations and thought it was the medicine that was causing him to hallucinate. The urgent care did not make any changes. His cousin felt his hallucinations were worsening so he brought him to the ED today. The patient stated he felt "pretty good." His cousin said he was near his baseline but still saying some off the wall statements. Patient also endorsed loose bowel movements for the past couple of days and some abdominal soreness.   ED course: On arrival, the patient was afebrile and normotensive. UA and TSH were normal. CMP showed  normal electrolytes, elevated Cr 1.50, and elevated total bilirubin. Troponin > 0.03. CBC showed mild anemia. Chest xray showed a left lower lobe hazy airspace disease which may reflect atelectasis vs pneumonia. He was started on ceftriaxone and azithromycin as well as an IVF bolus.    Meds:  Current Meds  Medication Sig  . acetaminophen (TYLENOL) 500 MG tablet Take 500 mg by mouth every 6 (six) hours as needed for moderate pain.   Marland Kitchen albuterol (PROVENTIL HFA;VENTOLIN HFA) 108 (90 BASE) MCG/ACT inhaler Inhale 2 puffs into the lungs every 4 (four) hours as needed for shortness of breath.   . allopurinol (ZYLOPRIM) 100 MG tablet Take 100 mg by mouth daily.   Marland Kitchen amiodarone (PACERONE) 200 MG tablet Take 1 tablet (200 mg total) by mouth daily.  Marland Kitchen apixaban (ELIQUIS) 5 MG TABS tablet TAKE ONE TABLET BY MOUTH TWICE DAILY TO PREVENT STROKE (Patient taking differently: Take 5 mg by mouth 2 (two) times daily. TAKE ONE TABLET BY MOUTH TWICE DAILY TO PREVENT STROKE)  . budesonide-formoterol (SYMBICORT) 160-4.5 MCG/ACT inhaler Inhale 2 puffs into the lungs 2 (two) times daily.  . Cholecalciferol (VITAMIN D) 2000 UNITS tablet Take 2,000 Units by mouth daily.  Marland Kitchen docusate sodium (COLACE) 100 MG capsule Take 100 mg by mouth daily as needed for mild constipation or moderate constipation.   . fenofibrate 160 MG tablet  Take 160 mg by mouth daily.  . fluticasone (FLONASE) 50 MCG/ACT nasal spray Place 2 sprays into both nostrils daily as needed.   . furosemide (LASIX) 80 MG tablet Take 1 tablet (80 mg total) by mouth daily.  . hydrALAZINE (APRESOLINE) 25 MG tablet Take 1 tablet (25 mg total) by mouth 2 (two) times daily.  . isosorbide mononitrate (IMDUR) 60 MG 24 hr tablet Take 1 tablet (60 mg total) by mouth daily.  Marland Kitchen levofloxacin (LEVAQUIN) 750 MG tablet Take 750 mg by mouth daily. For 5 days  . loratadine (CLARITIN) 10 MG tablet Take 10 mg by mouth every morning.   . potassium chloride SA (K-DUR,KLOR-CON) 20 MEQ  tablet Take 1 tablet (20 mEq total) by mouth every other day.  Marland Kitchen SYNTHROID 125 MCG tablet Take 125 mcg by mouth daily.  Marland Kitchen triamcinolone cream (KENALOG) 0.1 % Apply 1 application topically 2 (two) times daily.      Allergies: Allergies as of 05/30/2018 - Review Complete 05/30/2018  Allergen Reaction Noted  . Methimazole [methimazole] Itching and Rash 12/17/2011   Past Medical History:  Diagnosis Date  . Asthma    Albuterol prn;Symbicort daily  . Atrial fibrillation ([place])    takes COumadin daily  . Bruises easily    d/t being on Coumadin  . CAD (coronary artery disease)    predominantly single vessel  . Cardiomyopathy, ischemic   . Cataracts, bilateral to be removed in Aug 2015  . Chronic kidney disease 1976   S/P nephrectomy  . Constipation    takes Colace daily as needed  . Depression   . Dilated cardiomyopathy ([place])    s/p defibrillator [employer]Trust [phone]  . Dizziness    occasionally  . GERD (gastroesophageal reflux disease)    takes OTC med if needed  . Gout    takes Allopurinol daily  . Heart failure ([place])    NYHFA     CLASS 3  . History of hyperthyroidism   . Hyperlipidemia    takes Fenofibrate daily  . Hypertension    takes Bisoprolol daily as well as Imdur  . Hypothyroidism    takes Synthroid daily  . Joint pain   . Joint swelling   . Left knee DJD    needs surgery  . Myocardial infarction ([place]) 2007  . OSA (obstructive sleep apnea)    "don't wear mask; can't afford one" (12/20/2013)  . Pacemaker   . Peripheral edema    takes Furosemide daily  . Pneumonia, community acquired last time 2014   "get it ~ q yr; haven't had it yet in 2015" (12/20/2013)  . Seasonal allergies    takes Claritin daily  . Shortness of breath    with exertion  . Solitary kidney 06/05/2006  . Urinary frequency   . Urinary incontinence   . Urinary urgency   . Ventricular tachycardia ([facility])    s/p defibrillator-Medtronic EnTrust [id number]    Family History:   Family History    Problem Relation Age of Onset  . Heart disease Other        uncle  . Diabetes Other        uncle    Social History:  Patient lives alone and is able to perform ADL's on his own. He uses a cane to get around. He denies any tobacco, alcohol or drug use.   Social History   Socioeconomic History  . Marital status: Single    Spouse name: Not on file  . Number of children:  Not on file  . Years of education: Not on file  . Highest education level: Not on file  Occupational History  . Occupation: disabled  Social Needs  . Financial resource strain: Not on file  . Food insecurity:    Worry: Not on file    Inability: Not on file  . Transportation needs:    Medical: Not on file    Non-medical: Not on file  Tobacco Use  . Smoking status: Never Smoker  . Smokeless tobacco: Never Used  Substance and Sexual Activity  . Alcohol use: Yes    Comment: 12/20/2013 "might have drank a couple beers before; never really drank"  . Drug use: No  . Sexual activity: Never  Lifestyle  . Physical activity:    Days per week: Not on file    Minutes per session: Not on file  . Stress: Not on file  Relationships  . Social connections:    Talks on phone: Not on file    Gets together: Not on file    Attends religious service: Not on file    Active member of club or organization: Not on file    Attends meetings of clubs or organizations: Not on file    Relationship status: Not on file  . Intimate partner violence:    Fear of current or ex partner: Not on file    Emotionally abused: Not on file    Physically abused: Not on file    Forced sexual activity: Not on file  Other Topics Concern  . Not on file  Social History Narrative   Never married.  Worked previously in [employer].    Review of Systems: A complete ROS was negative except as per HPI.   Physical Exam: Blood pressure 106/60, pulse (!) 55, temperature 98.2 F (36.8 C), temperature source Oral, resp. rate 16, height 5\' 5"   (1.651 m), weight 90.9 kg, SpO2 97 %.  Physical Exam  Constitutional: He is oriented to person, place, and time and well-developed, well-nourished, and in no distress.  Cardiovascular: Normal rate, regular rhythm and normal heart sounds.  No murmur heard. Pulmonary/Chest: Effort normal and breath sounds normal. No respiratory distress. He exhibits no tenderness.  Abdominal: Soft. Bowel sounds are normal. He exhibits no distension. There is abdominal tenderness. There is no guarding.  Musculoskeletal:        General: No tenderness or edema.  Neurological: He is alert and oriented to person, place, and time.  Skin: Skin is warm and dry.  Chronic venous statis changes bilateral LE   Psychiatric: Mood and affect normal.    EKG: personally reviewed my interpretation is sinus rhythm, prolonged PR   CXR: personally reviewed my interpretation is left lower lobe atelectasis vs pneumonia  Assessment & Plan by Problem: Active Problems:   Delirium  Mr. Goldwater is a 72 y.o male with a PMHx of PAF on Coumadin, chronic combined systolic and diastolic heart failure, CAD, CKD stage 3, OSA and HLD presenting with altered mental status. He was recently diagnosed with pneumonia at his PCP's office and started on levofloxacin. His family noticed he was having worsening confusion and hallucinations since he started the medication so they brought him to the ED.  Acute encephalopathy  Patient is alert and oriented to person, place and time. Afebrile with no leukocytosis. No metabolic derangements, UDS negative. His family reported he has taken 3 doses of levofloxacin. Family also reported he has had hallucinations in the past when he had pneumonia. Most  likely due to his pneumonia. He was started on ceftriaxone and azithromycin in the ED.  -continue azithromycin and ceftriaxone  -cardiac monitoring   PAF  On eliquis 5 mg daily and amiodarone 200 mg qd  Chronic combined systolic and diastolic heart  failure Last echo from September 2017 showed LV EF 60%. Recently saw his cardiologist who increased his furosemide to 80 mg qd because he was more edematous.  -continue furosemide 80 mg qd, hydralazine 25 mg bid, imdur 60 mg qd  -f/u echo  Thyroid disease -continue levothyroxine 125 mcg  Diet: Heart healthy DVT Prophylaxis: Eliquis Full Code  Dispo: Admit patient to Observation with expected length of stay less than 2 midnights.  SignedMike Craze, DO 05/30/2018, 1:30 PM  Pager: [phone]

## 2018-05-30 NOTE — Progress Notes (Signed)
Admission note:  Arrival Method: Patient arrived in bed from ED. Mental Orientation: Alert and oriented x 4. Telemetry: 72M # 8, NSR Assessment: See doc flow sheets. Skin: intact but discoloration noted on the lower extremities. IV: Right forearm SL. Pain: Denies any pain. Tubes: N/A Safety Measures: Bed in low position, bed alarm initiated. Fall Prevention Safety Plan: Reviewed patient but  Needs reinitiated. Admission Screening: in process.  6700 Orientation: Patient has been oriented to the unit, staff and to the room.

## 2018-05-30 NOTE — ED Notes (Signed)
Patient transported to X-ray 

## 2018-05-30 NOTE — ED Triage Notes (Signed)
Pt has been seen outpatient at is being treated for pneumonia. Pt's family states that he has been more confused since yesterday.

## 2018-05-31 ENCOUNTER — Observation Stay (HOSPITAL_BASED_OUTPATIENT_CLINIC_OR_DEPARTMENT_OTHER): Payer: Medicare Other

## 2018-05-31 DIAGNOSIS — I351 Nonrheumatic aortic (valve) insufficiency: Secondary | ICD-10-CM | POA: Diagnosis not present

## 2018-05-31 DIAGNOSIS — J189 Pneumonia, unspecified organism: Secondary | ICD-10-CM

## 2018-05-31 DIAGNOSIS — I5042 Chronic combined systolic (congestive) and diastolic (congestive) heart failure: Secondary | ICD-10-CM | POA: Diagnosis not present

## 2018-05-31 DIAGNOSIS — G9340 Encephalopathy, unspecified: Secondary | ICD-10-CM | POA: Diagnosis not present

## 2018-05-31 DIAGNOSIS — I361 Nonrheumatic tricuspid (valve) insufficiency: Secondary | ICD-10-CM | POA: Diagnosis not present

## 2018-05-31 DIAGNOSIS — I480 Paroxysmal atrial fibrillation: Secondary | ICD-10-CM | POA: Diagnosis not present

## 2018-05-31 DIAGNOSIS — Z79899 Other long term (current) drug therapy: Secondary | ICD-10-CM

## 2018-05-31 LAB — GLUCOSE, CAPILLARY
Glucose-Capillary: 82 mg/dL (ref 70–99)
Glucose-Capillary: 84 mg/dL (ref 70–99)

## 2018-05-31 LAB — ECHOCARDIOGRAM COMPLETE
Height: 65 in
Weight: 3206.41 oz

## 2018-05-31 LAB — PROCALCITONIN: Procalcitonin: <0.1 ng/mL

## 2018-05-31 NOTE — Progress Notes (Signed)
  Echocardiogram 2D Echocardiogram has been performed.  Cody Faulkner 05/31/2018, 10:35 AM

## 2018-05-31 NOTE — Care Management Obs Status (Signed)
[facility] NOTIFICATION   Patient Details  Name: Cody Faulkner MRN: [id number] Date of Birth: 1945-11-20   Medicare Observation Status Notification Given:  Yes    Bartholomew Crews, RN 05/31/2018, 1:39 PM

## 2018-05-31 NOTE — Discharge Summary (Addendum)
Name: Cody Faulkner MRN: [id number] DOB: 11-12-1945 72 y.o. PCP: Cyndi Bender, PA-C  Date of Admission: 05/30/2018  9:27 AM Date of Discharge: 05/31/2018 Attending Physician: Oda Kilts, MD   Discharge Diagnosis: 1. Acute encephalopathy 2/2 CAP 2. Chronic combined systolic and diastolic heart failure  Discharge Medications: Allergies as of 05/31/2018      Reactions   Methimazole [methimazole] Itching, Rash   Severe rash      Medication List    STOP taking these medications   levofloxacin 750 MG tablet Commonly known as:  LEVAQUIN     TAKE these medications   acetaminophen 500 MG tablet Commonly known as:  TYLENOL Take 500 mg by mouth every 6 (six) hours as needed for moderate pain.   albuterol 108 (90 Base) MCG/ACT inhaler Commonly known as:  PROVENTIL HFA;VENTOLIN HFA Inhale 2 puffs into the lungs every 4 (four) hours as needed for shortness of breath.   allopurinol 100 MG tablet Commonly known as:  ZYLOPRIM Take 100 mg by mouth daily.   amiodarone 200 MG tablet Commonly known as:  PACERONE Take 1 tablet (200 mg total) by mouth daily.   apixaban 5 MG Tabs tablet Commonly known as:  ELIQUIS TAKE ONE TABLET BY MOUTH TWICE DAILY TO PREVENT STROKE What changed:    how much to take  how to take this  when to take this   budesonide-formoterol 160-4.5 MCG/ACT inhaler Commonly known as:  SYMBICORT Inhale 2 puffs into the lungs 2 (two) times daily.   docusate sodium 100 MG capsule Commonly known as:  COLACE Take 100 mg by mouth daily as needed for mild constipation or moderate constipation.   fenofibrate 160 MG tablet Take 160 mg by mouth daily.   fluticasone 50 MCG/ACT nasal spray Commonly known as:  FLONASE Place 2 sprays into both nostrils daily as needed.   furosemide 80 MG tablet Commonly known as:  LASIX Take 1 tablet (80 mg total) by mouth daily.   hydrALAZINE 25 MG tablet Commonly known as:  APRESOLINE Take 1 tablet (25 mg  total) by mouth 2 (two) times daily.   isosorbide mononitrate 60 MG 24 hr tablet Commonly known as:  IMDUR Take 1 tablet (60 mg total) by mouth daily.   loratadine 10 MG tablet Commonly known as:  CLARITIN Take 10 mg by mouth every morning.   potassium chloride SA 20 MEQ tablet Commonly known as:  K-DUR,KLOR-CON Take 1 tablet (20 mEq total) by mouth every other day.   SYNTHROID 125 MCG tablet Generic drug:  levothyroxine Take 125 mcg by mouth daily.   triamcinolone cream 0.1 % Commonly known as:  KENALOG Apply 1 application topically 2 (two) times daily.   Vitamin D 50 MCG (2000 UT) tablet Take 2,000 Units by mouth daily.       Disposition and follow-up:   Cody Faulkner was discharged from [facility] in Stable condition.  At the hospital follow up visit please address:  1.  Acute encephalopathy 2/2 CAP-presented with hallucinations most likely secondary to community-acquired pneumonia, please assess for improvement of his symptoms  2.  Labs / imaging needed at time of follow-up: none  3.  Pending labs/ test needing follow-up: none  Follow-up Appointments:  Patient is to follow up with is PCP in one week. Cyndi Bender, PA-C [address] [place] [address] [place] Hospital Course by problem list: 1. Acute encephalopathy 2/2 CAP-Cody Faulkner presented withaltered mental status. He went  to his pcp's office on Friday 12/20 due to him having bloody streaks in his cough starting Wednesday. He was diagnosed with pneumonia and prescribed levofloxacin. His cousin noticed he started having hallucinations. He reported that the patient was saying someone wanted to shoot/kill him. He also said in the past he has had hallucinations when he had pneumonia. His cousin took him to urgent care on Saturday due to the hallucinations and thought it was the medicine that was causing him to hallucinate. The urgent care did not make any changes. His cousin felt  his hallucinations were worsening so he brought him to the ED today.  He was afebrile and normotensive next x-ray showed a left lower lobe hazy airspace disease, atelectasis versus pneumonia.  He was started on ceftriaxone and azithromycin and given an IV fluid bolus.  He had taken 3 doses of levofloxacin and given 2 doses of azithromycin and ceftriaxone during his admission. Procalcitonin is within normal limits.  Charged home with no further antibiotics treatment as he was adequately treated with 5 days of antibiotic therapy.  2. Chronic combined systolic and diastolic heart failure- Last echo from September 2017 showed LV EF 60%. Recently saw his cardiologist who increased his furosemide to 80 mg qd because he was more edematous.  Echo done on this admission showed EF 60-65%, no regional wall motion abnormalities, grade 1 diastolic dysfunction, left atrium severely dilated.  Discharge Vitals:   BP (!) 107/58 (BP Location: Left Arm)   Pulse (!) 51   Temp 98.3 F (36.8 C) (Oral)   Resp 18   Ht 5\' 5"  (1.651 m)   Wt 90.9 kg   SpO2 98%   BMI 33.35 kg/m   Pertinent Labs, Studies, and Procedures:   CBC Latest Ref Rng & Units 05/30/2018 01/18/2014 12/25/2013  WBC 4.0 - 10.5 K/uL 5.1 6.7 4.7  Hemoglobin 13.0 - 17.0 g/dL 11.1(L) 12.8(L) 11.7(L)  Hematocrit 39.0 - 52.0 % 34.8(L) 38.1(L) 35.9(L)  Platelets 150 - 400 K/uL 229 191.0 169    Dg Chest 2 View  Result Date: 05/30/2018 CLINICAL DATA:  Shortness of breath EXAM: CHEST - 2 VIEW COMPARISON:  02/24/2016 FINDINGS: There is mild bilateral chronic interstitial thickening. There is left lower lobe hazy airspace disease which may reflect atelectasis versus pneumonia. There is no pleural effusion or pneumothorax. There is stable cardiomegaly. 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 The osseous structures are unremarkable. IMPRESSION: Left lower lobe hazy airspace disease which may reflect atelectasis versus pneumonia. Electronically Signed   By: Kathreen Devoid   On: 05/30/2018 11:03     Discharge Instructions: Discharge Instructions    Diet - low sodium heart healthy   Complete by:  As directed    Discharge instructions   Complete by:  As directed    Cody Faulkner,  You were hospitalized due to acute encephalopathy secondary to pneumonia.  We know with 2 doses of ceftriaxone and azithromycin antibiotics.  Based on some blood work that we checked, you are responding well to these antibiotics.  You have been appropriately treated with a total of 5 days of antibiotic therapy.  I want you to continue taking all your home medications as prescribed.  Please follow-up with your PCP in 1 to 2 weeks.  Thank you for allowing [place] to take part in your care! Happy Holidays!   Increase activity slowly   Complete by:  As directed       Signed: Rehman, Areeg N, DO 05/31/2018, 3:30 PM  Pager: [phone]   Internal Medicine Attending Note:  I saw and examined the patient on the day of discharge. I reviewed and agree with the discharge summary written by the house staff.  Lenice Pressman, M.D., Ph.D.

## 2018-05-31 NOTE — Discharge Instructions (Signed)

## 2018-05-31 NOTE — Progress Notes (Signed)
   Subjective: Cody Faulkner reports that he feels improved today, although he continues to have a cough. He endorses a good appetite. He has no new symptoms or concerns at this time.   Objective:  Vital signs in last 24 hours: Vitals:   05/30/18 2059 05/31/18 0540 05/31/18 0846 05/31/18 0922  BP: 129/63 118/61  (!) 107/58  Pulse: (!) 56 (!) 51  (!) 51  Resp: 20 18  18   Temp: 98.2 F (36.8 C) 98.2 F (36.8 C)  98.3 F (36.8 C)  TempSrc: Oral Oral  Oral  SpO2: 97% 96% 96% 98%  Weight:      Height:       Physical Exam  Constitutional: He is well-developed, well-nourished, and in no distress.  Cardiovascular: Normal rate, regular rhythm and normal heart sounds.  No murmur heard. Pulmonary/Chest: Effort normal and breath sounds normal. No respiratory distress. He has no wheezes.  Abdominal: Soft. Bowel sounds are normal. He exhibits no distension. There is no abdominal tenderness.  Musculoskeletal:        General: No tenderness or edema.  Neurological: He is alert.  Skin: Skin is warm and dry.  Psychiatric: Mood and memory normal.    Assessment/Plan:  Active Problems:   Delirium  Cody Faulkner is a 72 y.o male with a PMHx of PAF on Coumadin, chronic combined systolic and diastolic heart failure, CAD, CKD stage 3, OSA and HLD presenting with altered mental status. He was recently diagnosed with pneumonia at his PCP's office and started on levofloxacin. His family noticed he was having worsening confusion and hallucinations since he started the medication so they brought him to the ED.  Acute encephalopathy  Patient remains afebrile with no leukocytosis. No metabolic derangements. Today is day 2 of ceftriaxone and azithromycin; patient also took 3 doses of levofloxacin. Plan to check procalcitonin and possibly discharge home today if this is wnl. Patient will most likely not need further antibiotic therapy if procalcitonin is wnl. He has received a total of 5 days of abx  therapy. -azithromycin and ceftriaxone for day 2  -f/u procalcitonin  Chronic combined systolic and diastolic heart failure Echo showed EF 60-65%, no regional wall motion abnormalities, grade 1 diastolic dysfunction, left atrium severely dilated. -continue furosemide 80 mg qd, hydralazine 25 mg bid, imdur 60 mg qd    Dispo: Anticipated discharge is today, pending procalcitonin.  Signed: Harlow Ohms, Home

## 2018-06-07 ENCOUNTER — Other Ambulatory Visit: Payer: Self-pay | Admitting: Internal Medicine

## 2018-06-15 ENCOUNTER — Ambulatory Visit (INDEPENDENT_AMBULATORY_CARE_PROVIDER_SITE_OTHER): Payer: Medicare Other

## 2018-06-15 DIAGNOSIS — I420 Dilated cardiomyopathy: Secondary | ICD-10-CM

## 2018-06-15 DIAGNOSIS — I5022 Chronic systolic (congestive) heart failure: Secondary | ICD-10-CM

## 2018-06-16 DIAGNOSIS — I5030 Unspecified diastolic (congestive) heart failure: Secondary | ICD-10-CM | POA: Diagnosis not present

## 2018-06-16 DIAGNOSIS — R109 Unspecified abdominal pain: Secondary | ICD-10-CM | POA: Diagnosis not present

## 2018-06-16 DIAGNOSIS — K5900 Constipation, unspecified: Secondary | ICD-10-CM | POA: Diagnosis not present

## 2018-06-16 DIAGNOSIS — L309 Dermatitis, unspecified: Secondary | ICD-10-CM | POA: Diagnosis not present

## 2018-06-16 LAB — CUP PACEART REMOTE DEVICE CHECK
Battery Remaining Longevity: 59 mo
Battery Voltage: 2.98 V
Brady Statistic AP VP Percent: 0 %
Brady Statistic AP VS Percent: 0 %
Brady Statistic AS VP Percent: 0.18 %
Brady Statistic AS VS Percent: 99.82 %
Brady Statistic RV Percent Paced: 0.23 %
Date Time Interrogation Session: 20200108051801
HighPow Impedance: 66 Ohm
Implantable Lead Implant Date: 20150820
Implantable Lead Implant Date: 20150820
Implantable Lead Location: 753859
Implantable Lead Location: 753860
Implantable Lead Model: 5076
Implantable Lead Model: 6935
Lead Channel Impedance Value: 399 Ohm
Lead Channel Impedance Value: 4047 Ohm
Lead Channel Pacing Threshold Amplitude: 2.375 V
Lead Channel Pacing Threshold Pulse Width: 0.4 ms
Lead Channel Pacing Threshold Pulse Width: 0.4 ms
Lead Channel Sensing Intrinsic Amplitude: 1 mV
Lead Channel Sensing Intrinsic Amplitude: 1 mV
Lead Channel Sensing Intrinsic Amplitude: 12.5 mV
Lead Channel Setting Pacing Amplitude: 2.5 V
Lead Channel Setting Pacing Pulse Width: 0.4 ms
MDC IDC MSMT LEADCHNL RV IMPEDANCE VALUE: 323 Ohm
MDC IDC MSMT LEADCHNL RV PACING THRESHOLD AMPLITUDE: 0.75 V
MDC IDC MSMT LEADCHNL RV SENSING INTR AMPL: 12.5 mV
MDC IDC PG IMPLANT DT: 20150820
MDC IDC SET LEADCHNL RV SENSING SENSITIVITY: 0.3 mV
MDC IDC STAT BRADY RA PERCENT PACED: 0 %

## 2018-06-16 NOTE — Progress Notes (Signed)
Remote ICD transmission.   

## 2018-06-30 ENCOUNTER — Other Ambulatory Visit: Payer: Self-pay | Admitting: Internal Medicine

## 2018-06-30 DIAGNOSIS — I480 Paroxysmal atrial fibrillation: Secondary | ICD-10-CM

## 2018-07-06 DIAGNOSIS — R443 Hallucinations, unspecified: Secondary | ICD-10-CM | POA: Diagnosis not present

## 2018-07-06 DIAGNOSIS — E039 Hypothyroidism, unspecified: Secondary | ICD-10-CM | POA: Diagnosis not present

## 2018-07-06 DIAGNOSIS — F419 Anxiety disorder, unspecified: Secondary | ICD-10-CM | POA: Diagnosis not present

## 2018-07-06 DIAGNOSIS — Z79899 Other long term (current) drug therapy: Secondary | ICD-10-CM | POA: Diagnosis not present

## 2018-07-06 DIAGNOSIS — I2510 Atherosclerotic heart disease of native coronary artery without angina pectoris: Secondary | ICD-10-CM | POA: Diagnosis not present

## 2018-07-06 DIAGNOSIS — I5030 Unspecified diastolic (congestive) heart failure: Secondary | ICD-10-CM | POA: Diagnosis not present

## 2018-07-08 ENCOUNTER — Emergency Department (HOSPITAL_COMMUNITY)
Admission: EM | Admit: 2018-07-08 | Discharge: 2018-07-08 | Disposition: A | Discharge status: Home / Self Care | Payer: Medicare Other | Attending: Emergency Medicine | Admitting: Emergency Medicine

## 2018-07-08 ENCOUNTER — Emergency Department (HOSPITAL_COMMUNITY): Payer: Medicare Other

## 2018-07-08 ENCOUNTER — Encounter (HOSPITAL_COMMUNITY): Payer: Self-pay | Admitting: Emergency Medicine

## 2018-07-08 DIAGNOSIS — R443 Hallucinations, unspecified: Secondary | ICD-10-CM | POA: Diagnosis not present

## 2018-07-08 DIAGNOSIS — E039 Hypothyroidism, unspecified: Secondary | ICD-10-CM | POA: Diagnosis not present

## 2018-07-08 DIAGNOSIS — I4891 Unspecified atrial fibrillation: Secondary | ICD-10-CM | POA: Diagnosis not present

## 2018-07-08 DIAGNOSIS — Y929 Unspecified place or not applicable: Secondary | ICD-10-CM | POA: Diagnosis not present

## 2018-07-08 DIAGNOSIS — Y939 Activity, unspecified: Secondary | ICD-10-CM | POA: Insufficient documentation

## 2018-07-08 DIAGNOSIS — S199XXA Unspecified injury of neck, initial encounter: Secondary | ICD-10-CM | POA: Diagnosis not present

## 2018-07-08 DIAGNOSIS — N183 Chronic kidney disease, stage 3 (moderate): Secondary | ICD-10-CM | POA: Diagnosis not present

## 2018-07-08 DIAGNOSIS — I2510 Atherosclerotic heart disease of native coronary artery without angina pectoris: Secondary | ICD-10-CM | POA: Insufficient documentation

## 2018-07-08 DIAGNOSIS — Z950 Presence of cardiac pacemaker: Secondary | ICD-10-CM | POA: Insufficient documentation

## 2018-07-08 DIAGNOSIS — S2191XA Laceration without foreign body of unspecified part of thorax, initial encounter: Secondary | ICD-10-CM | POA: Diagnosis not present

## 2018-07-08 DIAGNOSIS — Y999 Unspecified external cause status: Secondary | ICD-10-CM | POA: Insufficient documentation

## 2018-07-08 DIAGNOSIS — J4530 Mild persistent asthma, uncomplicated: Secondary | ICD-10-CM | POA: Diagnosis not present

## 2018-07-08 DIAGNOSIS — S0012XA Contusion of left eyelid and periocular area, initial encounter: Secondary | ICD-10-CM | POA: Insufficient documentation

## 2018-07-08 DIAGNOSIS — Z79899 Other long term (current) drug therapy: Secondary | ICD-10-CM | POA: Insufficient documentation

## 2018-07-08 DIAGNOSIS — W19XXXA Unspecified fall, initial encounter: Secondary | ICD-10-CM | POA: Diagnosis not present

## 2018-07-08 DIAGNOSIS — S0990XA Unspecified injury of head, initial encounter: Secondary | ICD-10-CM | POA: Diagnosis not present

## 2018-07-08 DIAGNOSIS — S0512XA Contusion of eyeball and orbital tissues, left eye, initial encounter: Secondary | ICD-10-CM | POA: Diagnosis not present

## 2018-07-08 DIAGNOSIS — I10 Essential (primary) hypertension: Secondary | ICD-10-CM | POA: Diagnosis not present

## 2018-07-08 DIAGNOSIS — I130 Hypertensive heart and chronic kidney disease with heart failure and stage 1 through stage 4 chronic kidney disease, or unspecified chronic kidney disease: Secondary | ICD-10-CM | POA: Insufficient documentation

## 2018-07-08 DIAGNOSIS — T148XXA Other injury of unspecified body region, initial encounter: Secondary | ICD-10-CM

## 2018-07-08 DIAGNOSIS — I5042 Chronic combined systolic (congestive) and diastolic (congestive) heart failure: Secondary | ICD-10-CM | POA: Insufficient documentation

## 2018-07-08 DIAGNOSIS — S0083XA Contusion of other part of head, initial encounter: Secondary | ICD-10-CM | POA: Diagnosis not present

## 2018-07-08 LAB — CBC WITH DIFFERENTIAL/PLATELET
Abs Immature Granulocytes: 0.05 10*3/uL (ref 0.00–0.07)
Basophils Absolute: 0 10*3/uL (ref 0.0–0.1)
Basophils Relative: 0 %
Eosinophils Absolute: 0.2 10*3/uL (ref 0.0–0.5)
Eosinophils Relative: 2 %
HCT: 37 % — ABNORMAL LOW (ref 39.0–52.0)
Hemoglobin: 11.8 g/dL — ABNORMAL LOW (ref 13.0–17.0)
Immature Granulocytes: 1 %
Lymphocytes Relative: 18 %
Lymphs Abs: 1.2 10*3/uL (ref 0.7–4.0)
MCH: 29.1 pg (ref 26.0–34.0)
MCHC: 31.9 g/dL (ref 30.0–36.0)
MCV: 91.4 fL (ref 80.0–100.0)
MONO ABS: 0.7 10*3/uL (ref 0.1–1.0)
MONOS PCT: 10 %
NEUTROS PCT: 69 %
Neutro Abs: 4.3 10*3/uL (ref 1.7–7.7)
Platelets: 204 10*3/uL (ref 150–400)
RBC: 4.05 MIL/uL — ABNORMAL LOW (ref 4.22–5.81)
RDW: 14.5 % (ref 11.5–15.5)
WBC: 6.3 10*3/uL (ref 4.0–10.5)
nRBC: 0 % (ref 0.0–0.2)

## 2018-07-08 LAB — BASIC METABOLIC PANEL
Anion gap: 7 (ref 5–15)
BUN: 28 mg/dL — ABNORMAL HIGH (ref 8–23)
CO2: 27 mmol/L (ref 22–32)
Calcium: 9.2 mg/dL (ref 8.9–10.3)
Chloride: 108 mmol/L (ref 98–111)
Creatinine, Ser: 1.44 mg/dL — ABNORMAL HIGH (ref 0.61–1.24)
GFR calc Af Amer: 55 mL/min — ABNORMAL LOW (ref >60)
GFR calc non Af Amer: 47 mL/min — ABNORMAL LOW (ref >60)
Glucose, Bld: 89 mg/dL (ref 70–99)
Potassium: 3.9 mmol/L (ref 3.5–5.1)
SODIUM: 142 mmol/L (ref 135–145)

## 2018-07-08 NOTE — ED Notes (Signed)
D/c reviewed with patient and family 

## 2018-07-08 NOTE — ED Notes (Signed)
Patient transported to CT 

## 2018-07-08 NOTE — Discharge Instructions (Addendum)
Continue to use ice, Tylenol.  Case management will continue to follow along with your case and see if you are able to get some home health.  Follow-up with your primary care doctor about transitioning to assisted living facility.  Patient likely needs dementia work-up.

## 2018-07-08 NOTE — ED Notes (Signed)
Patient's family just arrived to the room reporting that patient has been hallucinating, stating "they are gonna burn me out" of his house, so patient was probably running from the house when he fell.

## 2018-07-08 NOTE — Discharge Planning (Signed)
Cody Faulkner J. Cody Winkowski, RN, BSN, NCM [phone] Spoke with pt at bedside regarding discharge planning for Home Health Services. Offered pt list of home health agencies to choose from.  Pt chose [facility] to render services. Drew Wilkie of [facility] notified. Patient made aware that [facility] will be in contact in 24-48 hours.  No DME needs identified at this time.  

## 2018-07-08 NOTE — Progress Notes (Signed)
CSW and RNCM spoke with MD regarding pt. CSw and RNCM informed that pt is in need of  Home health needs at this time. RNCM checking to see if pt is able to get services. CSW will continue to follow for further needs.     Virgie Dad Niesha Bame, MSW, [facility] Clinical Social Worker [phone]

## 2018-07-08 NOTE — ED Provider Notes (Signed)
[facility] Provider Note   CSN: [id number] Arrival date & time: 07/08/18  0907     History   Chief Complaint Chief Complaint  Patient presents with  . Fall    HPI [facility] is a 73 y.o. male.  The history is provided by the patient.  Fall  This is a new problem. The current episode started 6 to 12 hours ago. The problem occurs rarely. The problem has been resolved. Associated symptoms include chest pain (chest wall pain ) and headaches. Pertinent negatives include no abdominal pain and no shortness of breath. Nothing aggravates the symptoms. Nothing relieves the symptoms. He has tried nothing for the symptoms. The treatment provided no relief.    Past Medical History:  Diagnosis Date  . Asthma    Albuterol prn;Symbicort daily  . Atrial fibrillation ([place])    takes COumadin daily  . Bruises easily    d/t being on Coumadin  . CAD (coronary artery disease)    predominantly single vessel  . Cardiomyopathy, ischemic   . Cataracts, bilateral to be removed in Aug 2015  . Chronic kidney disease 1976   S/P nephrectomy  . Constipation    takes Colace daily as needed  . Depression   . Dilated cardiomyopathy ([place])    s/p defibrillator [employer] EnTrust [phone]  . Dizziness    occasionally  . GERD (gastroesophageal reflux disease)    takes OTC med if needed  . Gout    takes Allopurinol daily  . Heart failure ([place])    NYHFA     CLASS 3  . History of hyperthyroidism   . Hyperlipidemia    takes Fenofibrate daily  . Hypertension    takes Bisoprolol daily as well as Imdur  . Hypothyroidism    takes Synthroid daily  . Joint pain   . Joint swelling   . Left knee DJD    needs surgery  . Myocardial infarction ([place]) 2007  . OSA (obstructive sleep apnea)    "don't wear mask; can't afford one" (12/20/2013)  . Pacemaker   . Peripheral edema    takes Furosemide daily  . Pneumonia, community acquired last time 2014   "get it ~ q yr;  haven't had it yet in 2015" (12/20/2013)  . Seasonal allergies    takes Claritin daily  . Shortness of breath    with exertion  . Solitary kidney 06/05/2006  . Urinary frequency   . Urinary incontinence   . Urinary urgency   . Ventricular tachycardia [facility])    s/p defibrillator-[employer]tronic EnTrust [id number]    Patient Active Problem List   Diagnosis Date Noted  . Delirium 05/30/2018  . On amiodarone therapy 05/18/2018  . Chronic anticoagulation- Coumadin 12/23/2013  . Sinus node dysfunction ([place]) 12/21/2013  . Paroxysmal ventricular tachycardia ([place]) 12/21/2013  . ICD (implantable cardioverter-defibrillator) lead failure 12/20/2013  . Obesity hypoventilation syndrome ([place]) 12/07/2012  . History of hyperthyroidism   . CKD (chronic kidney disease) stage 3, GFR 30-59 ml/min (HCC) 06/23/2011  . Morbid obesity   . Long term current use of anticoagulant therapy   . Hypertensive heart disease with heart failure ([place])   . Solitary kidney   . Community acquired pneumonia 06/06/2011  . Acute encephalopathy 06/06/2011  . Hypothyroidism   . Mild persistent asthma   . Paroxysmal atrial fibrillation (HCC)   . Automatic implantable cardioverter-defibrillator in situ   . History of dilated cardiomyopathy- last EF 50-55% 2D July 2014   .  Chronic combined systolic and diastolic heart failure ([place])   . CAD nonobstructive- 2007 11/12/2005    Past Surgical History:  Procedure Laterality Date  . CARDIAC CATHETERIZATION  2007   60% Stenosis mid-CFX  . CARDIAC DEFIBRILLATOR PLACEMENT  2007   Medtronic EnTrust [phone]  . CARDIAC DEFIBRILLATOR REMOVAL  12/20/2013  . CARDIOVERSION  06/18/2011   Procedure: CARDIOVERSION;  Surgeon: Kerry Hough., MD;  Location: [place];  Service: Cardiovascular;  Laterality: N/A;  . CARDIOVERSION N/A 12/06/2012   Procedure: CARDIOVERSION;  Surgeon: Jacolyn Reedy, MD;  Location: [place];  Service: Cardiovascular;  Laterality: N/A;  . COLONOSCOPY    . ICD GENERATOR  CHANGE  07/2012  . ICD LEAD REMOVAL Left 12/20/2013   Procedure: ICD LEAD REMOVAL//EXTRACTION AND PLACEMENT OF TEMP/PERM ATRIAL LEAD;  Surgeon: Evans Lance, MD;  Location: [place];  Service: Cardiovascular;  Laterality: Left;  . IMPLANTABLE CARDIOVERTER DEFIBRILLATOR IMPLANT N/A 01/25/2014   Procedure: IMPLANTABLE CARDIOVERTER DEFIBRILLATOR IMPLANT;  Surgeon: Evans Lance, MD;  Location: [facility] CATH LAB;  Service: Cardiovascular;  Laterality: N/A;  . IMPLANTABLE CARDIOVERTER DEFIBRILLATOR REVISION N/A 07/13/2012   Procedure: IMPLANTABLE CARDIOVERTER DEFIBRILLATOR REVISION;  Surgeon: Deboraha Sprang, MD;  Location: [facility] LAB;  Service: Cardiovascular;  Laterality: N/A;  . INSERT / REPLACE / REMOVE PACEMAKER  12/20/2013  . KNEE ARTHROSCOPY Left   . MULTIPLE TOOTH EXTRACTIONS     "top's are all gone; took some off the bottom too"  . NEPHRECTOMY  1976   Right  . TEE WITHOUT CARDIOVERSION  07/13/2012   Procedure: TRANSESOPHAGEAL ECHOCARDIOGRAM (TEE);  Surgeon: Lelon Perla, MD;  Location: [facility] ENDOSCOPY;  Service: Cardiovascular;  Laterality: N/A;        Home Medications    Prior to Admission medications   Medication Sig Start Date End Date Taking? Authorizing Provider  acetaminophen (TYLENOL) 500 MG tablet Take 500 mg by mouth every 6 (six) hours as needed for moderate pain.    Yes [provider]  albuterol (PROVENTIL HFA;VENTOLIN HFA) 108 (90 BASE) MCG/ACT inhaler Inhale 2 puffs into the lungs every 4 (four) hours as needed for shortness of breath.    Yes [provider]  allopurinol (ZYLOPRIM) 100 MG tablet Take 100 mg by mouth daily.  10/28/13  Yes [provider]  amiodarone (PACERONE) 200 MG tablet Take 1 tablet (200 mg total) by mouth daily. 07/30/17  Yes Evans Lance, MD  budesonide-formoterol [facility]) 160-4.5 MCG/ACT inhaler Inhale 2 puffs into the lungs 2 (two) times daily.   Yes [provider]  Cholecalciferol (VITAMIN D) 2000 UNITS tablet Take  2,000 Units by mouth daily.   Yes [provider]  clonazePAM (KLONOPIN) 0.5 MG tablet Take 0.25-0.5 mg by mouth See admin instructions. Take 1 tablet by mouth at bedtime for sleep and 1/2 tablet in the morning for anxiety 07/06/18  Yes [provider]  docusate sodium (COLACE) 100 MG capsule Take 100 mg by mouth daily as needed for mild constipation or moderate constipation.    Yes [provider]  ELIQUIS 5 MG TABS tablet TAKE 1 TABLET BY MOUTH TWICE A DAY Patient taking differently: Take 5 mg by mouth 2 (two) times daily.  06/30/18  Yes Evans Lance, MD  fenofibrate 160 MG tablet Take 160 mg by mouth daily.   Yes [provider]  fluticasone (FLONASE) 50 MCG/ACT nasal spray Place 2 sprays into both nostrils daily as needed for allergies.    Yes [provider]  furosemide (LASIX) 80 MG tablet Take 1 tablet (80 mg total) by mouth daily. 05/18/18  Yes Richardo Priest, MD  hydrALAZINE (APRESOLINE) 25 MG tablet Take 1 tablet (25 mg total) by mouth 2 (two) times daily. 07/30/17  Yes Evans Lance, MD  isosorbide mononitrate (IMDUR) 60 MG 24 hr tablet Take 1 tablet (60 mg total) by mouth daily. 07/30/17  Yes Evans Lance, MD  loratadine (CLARITIN) 10 MG tablet Take 10 mg by mouth every morning.    Yes [provider]  losartan (COZAAR) 100 MG tablet Take 100 mg by mouth daily.   Yes [provider]  potassium chloride SA (K-DUR,KLOR-CON) 20 MEQ tablet Take 1 tablet (20 mEq total) by mouth every other day. 07/16/17  Yes Evans Lance, MD  SYNTHROID 125 MCG tablet Take 125 mcg by mouth daily. 01/29/18  Yes [provider]  triamcinolone cream (KENALOG) 0.1 % Apply 1 application topically as needed (on affected area(s)on skin).  05/04/18  Yes [provider]    Family History Family History  Problem Relation Age of Onset  . Heart disease Other        uncle  . Diabetes Other        uncle    Social History Social  History   Tobacco Use  . Smoking status: Never Smoker  . Smokeless tobacco: Never Used  Substance Use Topics  . Alcohol use: Yes    Comment: 12/20/2013 "might have drank a couple beers before; never really drank"  . Drug use: No     Allergies   Methimazole [methimazole]   Review of Systems Review of Systems  Constitutional: Negative for chills and fever.  HENT: Negative for ear pain and sore throat.   Eyes: Negative for pain and visual disturbance.  Respiratory: Negative for cough and shortness of breath.   Cardiovascular: Positive for chest pain (chest wall pain ). Negative for palpitations.  Gastrointestinal: Negative for abdominal pain and vomiting.  Genitourinary: Negative for dysuria and hematuria.  Musculoskeletal: Negative for arthralgias and back pain.  Skin: Positive for color change and wound. Negative for rash.  Neurological: Positive for headaches. Negative for dizziness, tremors, seizures, syncope, facial asymmetry, speech difficulty, weakness, light-headedness and numbness.  All other systems reviewed and are negative.    Physical Exam Updated Vital Signs  ED Triage Vitals  Enc Vitals Group     BP 07/08/18 0921 (!) 183/59     Pulse Rate 07/08/18 0921 (!) 51     Resp 07/08/18 0921 14     Temp 07/08/18 0925 97.6 F (36.4 C)     Temp Source 07/08/18 0925 Oral     SpO2 07/08/18 0921 100 %     Weight --      Height --      Head Circumference --      Peak Flow --      Pain Score 07/08/18 0917 0     Pain Loc --      Pain Edu? --      Excl. in [place]? --     Physical Exam Vitals signs and nursing note reviewed.  Constitutional:      Appearance: He is well-developed.  HENT:     Head:     Comments: Large hematoma around the left eye, left forehead, left eye/eyelid with severe swelling and ecchymosis    Nose: Nose normal.     Mouth/Throat:     Mouth: Mucous membranes are moist.  Eyes:     Conjunctiva/sclera: Conjunctivae normal.     Comments: Right eye  equal and reactive with normal extraocular movement, left eye swollen shut  Neck:     Musculoskeletal: Neck supple. No muscular tenderness.  Cardiovascular:     Rate and Rhythm: Regular rhythm. Bradycardia present.     Pulses: Normal pulses.     Heart sounds: Normal heart sounds. No murmur.  Pulmonary:     Effort: Pulmonary effort is normal. No respiratory distress.     Breath sounds: Normal breath sounds.  Abdominal:     General: There is no distension.     Palpations: Abdomen is soft.     Tenderness: There is no abdominal tenderness.  Musculoskeletal: Normal range of motion.        General: No tenderness.     Right lower leg: No edema.     Left lower leg: No edema.  Skin:    General: Skin is warm and dry.     Capillary Refill: Capillary refill takes less than 2 seconds.     Findings: Bruising present.  Neurological:     General: No focal deficit present.     Mental Status: He is alert and oriented to person, place, and time.     Cranial Nerves: No cranial nerve deficit.     Sensory: No sensory deficit.     Motor: No weakness.     Coordination: Coordination normal.     Gait: Gait normal.     Comments: 5+ out of 5 strength throughout, normal sensation, no drift  Psychiatric:        Mood and Affect: Mood normal.      ED Treatments / Results  Labs (all labs ordered are listed, but only abnormal results are displayed) Labs Reviewed  CBC WITH DIFFERENTIAL/PLATELET - Abnormal; Notable for the following components:      Result Value   RBC 4.05 (*)    Hemoglobin 11.8 (*)    HCT 37.0 (*)    All other components within normal limits  BASIC METABOLIC PANEL - Abnormal; Notable for the following components:   BUN 28 (*)    Creatinine, Ser 1.44 (*)    GFR calc non Af Amer 47 (*)    GFR calc Af Amer 55 (*)    All other components within normal limits    EKG EKG Interpretation  Date/Time:  Friday July 08 2018 09:21:28 EST Ventricular Rate:  51 PR Interval:    QRS  Duration: 122 QT Interval:  488 QTC Calculation: 450 R Axis:   -68 Text Interpretation:  Sinus rhythm Nonspecific IVCD with LAD Borderline T abnormalities, anterior leads Confirmed by Lennice Sites [phone]) on 07/08/2018 10:06:20 AM   Radiology Ct Head Wo Contrast  Result Date: 07/08/2018 CLINICAL DATA:  [place] EXAM: CT HEAD WITHOUT CONTRAST CT MAXILLOFACIAL WITHOUT CONTRAST CT CERVICAL SPINE WITHOUT CONTRAST TECHNIQUE: Multidetector CT imaging of the head, cervical spine, and maxillofacial structures were performed using the standard protocol without intravenous contrast. Multiplanar CT image reconstructions of the cervical spine and maxillofacial structures were also generated. COMPARISON:  12/08/2012 FINDINGS: CT HEAD FINDINGS Brain: Mild atrophy. No evidence of acute infarction, hemorrhage, hydrocephalus, extra-axial collection or mass lesion/mass effect. Vascular: Atherosclerotic and physiologic intracranial calcifications. Skull: Normal. Negative for fracture or focal lesion. Other: None CT MAXILLOFACIAL FINDINGS Osseous: No fracture or mandibular dislocation. No destructive process. Missing dentition. Orbits: Large left supraorbital subcutaneous hematoma. No intraorbital pathology. Sinuses: Normally developed, well aerated. Soft tissues: Left frontal/supraorbital subcutaneous  hematoma. CT CERVICAL SPINE FINDINGS Alignment: Normal Skull base and vertebrae: Negative for fracture. Exuberant pannus at C1-2 articulation. No focal acute lesion. Soft tissues and spinal canal: No prevertebral fluid or swelling. No visible canal hematoma. Bilateral calcified carotid bifurcation plaque. Disc levels: Small posterior endplate spurs at all levels C2-C7. Mild narrowing of the C5-6 interspace with vacuum phenomenon. Asymmetric facet DJD C2-C6, left worse than right. Upper chest: Azygos fissure, an anatomic variant. Right transvenous pacing leads partially visualized. Other: None IMPRESSION: 1. Negative for bleed or  other acute intracranial process. 2. Large left supraorbital subcutaneous hematoma without fracture or intraorbital pathology. 3. Negative for cervical fracture or other acute bone abnormality. 4. Cervical degenerative changes C2-C7 as above. Electronically Signed   By: Lucrezia Europe M.D.   On: 07/08/2018 10:33   Ct Cervical Spine Wo Contrast  Result Date: 07/08/2018 CLINICAL DATA:  [place] EXAM: CT HEAD WITHOUT CONTRAST CT MAXILLOFACIAL WITHOUT CONTRAST CT CERVICAL SPINE WITHOUT CONTRAST TECHNIQUE: Multidetector CT imaging of the head, cervical spine, and maxillofacial structures were performed using the standard protocol without intravenous contrast. Multiplanar CT image reconstructions of the cervical spine and maxillofacial structures were also generated. COMPARISON:  12/08/2012 FINDINGS: CT HEAD FINDINGS Brain: Mild atrophy. No evidence of acute infarction, hemorrhage, hydrocephalus, extra-axial collection or mass lesion/mass effect. Vascular: Atherosclerotic and physiologic intracranial calcifications. Skull: Normal. Negative for fracture or focal lesion. Other: None CT MAXILLOFACIAL FINDINGS Osseous: No fracture or mandibular dislocation. No destructive process. Missing dentition. Orbits: Large left supraorbital subcutaneous hematoma. No intraorbital pathology. Sinuses: Normally developed, well aerated. Soft tissues: Left frontal/supraorbital subcutaneous hematoma. CT CERVICAL SPINE FINDINGS Alignment: Normal Skull base and vertebrae: Negative for fracture. Exuberant pannus at C1-2 articulation. No focal acute lesion. Soft tissues and spinal canal: No prevertebral fluid or swelling. No visible canal hematoma. Bilateral calcified carotid bifurcation plaque. Disc levels: Small posterior endplate spurs at all levels C2-C7. Mild narrowing of the C5-6 interspace with vacuum phenomenon. Asymmetric facet DJD C2-C6, left worse than right. Upper chest: Azygos fissure, an anatomic variant. Right transvenous pacing leads  partially visualized. Other: None IMPRESSION: 1. Negative for bleed or other acute intracranial process. 2. Large left supraorbital subcutaneous hematoma without fracture or intraorbital pathology. 3. Negative for cervical fracture or other acute bone abnormality. 4. Cervical degenerative changes C2-C7 as above. Electronically Signed   By: Lucrezia Europe M.D.   On: 07/08/2018 10:33   Dg Chest Portable 1 View  Result Date: 07/08/2018 CLINICAL DATA:  Fall, head laceration and swelling, elevated blood pressure, shortness of breath, history atrial fibrillation, pacemaker, CHF EXAM: PORTABLE CHEST 1 VIEW COMPARISON:  Portable exam 1043 hours compared to 05/30/2018 FINDINGS: RIGHT subclavian transvenous AICD leads project over RIGHT atrium and RIGHT ventricle unchanged. Enlargement of cardiac silhouette. Mediastinal contours and pulmonary vascularity normal. Chronic accentuation of interstitial markings in the lower lungs. No acute infiltrate, pleural effusion or pneumothorax. Bones demineralized. IMPRESSION: No acute abnormalities. Electronically Signed   By: Lavonia Dana M.D.   On: 07/08/2018 11:14   Ct Maxillofacial Wo Contrast  Result Date: 07/08/2018 CLINICAL DATA:  [place] EXAM: CT HEAD WITHOUT CONTRAST CT MAXILLOFACIAL WITHOUT CONTRAST CT CERVICAL SPINE WITHOUT CONTRAST TECHNIQUE: Multidetector CT imaging of the head, cervical spine, and maxillofacial structures were performed using the standard protocol without intravenous contrast. Multiplanar CT image reconstructions of the cervical spine and maxillofacial structures were also generated. COMPARISON:  12/08/2012 FINDINGS: CT HEAD FINDINGS Brain: Mild atrophy. No evidence of acute infarction, hemorrhage, hydrocephalus, extra-axial collection or mass lesion/mass effect.  Vascular: Atherosclerotic and physiologic intracranial calcifications. Skull: Normal. Negative for fracture or focal lesion. Other: None CT MAXILLOFACIAL FINDINGS Osseous: No fracture or mandibular  dislocation. No destructive process. Missing dentition. Orbits: Large left supraorbital subcutaneous hematoma. No intraorbital pathology. Sinuses: Normally developed, well aerated. Soft tissues: Left frontal/supraorbital subcutaneous hematoma. CT CERVICAL SPINE FINDINGS Alignment: Normal Skull base and vertebrae: Negative for fracture. Exuberant pannus at C1-2 articulation. No focal acute lesion. Soft tissues and spinal canal: No prevertebral fluid or swelling. No visible canal hematoma. Bilateral calcified carotid bifurcation plaque. Disc levels: Small posterior endplate spurs at all levels C2-C7. Mild narrowing of the C5-6 interspace with vacuum phenomenon. Asymmetric facet DJD C2-C6, left worse than right. Upper chest: Azygos fissure, an anatomic variant. Right transvenous pacing leads partially visualized. Other: None IMPRESSION: 1. Negative for bleed or other acute intracranial process. 2. Large left supraorbital subcutaneous hematoma without fracture or intraorbital pathology. 3. Negative for cervical fracture or other acute bone abnormality. 4. Cervical degenerative changes C2-C7 as above. Electronically Signed   By: Lucrezia Europe M.D.   On: 07/08/2018 10:33    Procedures Procedures (including critical care time)  Medications Ordered in ED Medications - No data to display  EMERGENCY DEPARTMENT [place] OCULAR EXAM "Study: Limited Ultrasound of Orbit "  INDICATIONS: Traumatic Linear probe utilized to obtain images in both long and short axis of the orbit having the patient look left and right if possible.  PERFORMED BY: Myself IMAGES ARCHIVED?: Yes LIMITATIONS: pain VIEWS USED: Left orbit INTERPRETATION: Normal EOM, no lens dislocation, no blood    Initial Impression / Assessment and Plan / ED Course  I have reviewed the triage vital signs and the nursing notes.  Pertinent labs & imaging results that were available during my care of the patient were reviewed by me and considered in my medical  decision making (see chart for details).     [facility] is a 73 year old male with history of atrial fibrillation on Eliquis, hypertension, heart failure who presents to the ED after fall.  Patient hypertensive and bradycardic upon arrival.  Has not taken his medications today.  Patient states that he bent over to put his shoes on this morning and fell forward and hit the left side of his face.  Patient has ecchymosis and swelling around the left eye, left forehead.  Denies any neck pain, jaw pain.  Overall is well-appearing.  According to the son he has had some issues with memory, auditory hallucinations that have been ongoing.  He seems to have some paranoia at times.  He denies any suicidal homicidal ideation at this time.  Suspect likely that patient has some underlying dementia with behavioral disturbance as this has been ongoing for a long time.  They have been working with primary care doctor about possible placement as he appears to be no longer really safe living by himself.  Patient has normal neurological exam.  Will obtain images of his head, face, chest, neck.  Basic labs drawn.  EKG performed showed sinus rhythm.  No signs of ischemic changes.  Today it sounds like patient had mechanical fall.  CT imaging shows significant soft tissue swelling around the left eye.  However no intracranial or intraorbital process.  No retrobulbar hematoma.  Bedside ultrasound was performed that showed no lens dislocation, normal extraocular movement.  Left eye was swollen shut and overall difficult to examine.  I was able to open up the eyelids to get a good look at his extraocular movements which  were normal.  Given findings on head CT and face CT no concern for intraorbital process otherwise.  CT of the neck unremarkable.  Pain improved with ice.  Suspect that swelling and hematoma all from Eliquis use and all appear to be localized to the subcutaneous tissue.  Recommend continued use of ice, Tylenol at  home.  Social work and case management were engaged to help with some home health needs.  Also recommend close follow-up with primary care doctor to help assist in the transition into possibly long-term care.  Patient discharged in ED in good condition.  This chart was dictated using voice recognition software.  Despite best efforts to proofread,  errors can occur which can change the documentation meaning.    Final Clinical Impressions(s) / ED Diagnoses   Final diagnoses:  Fall, initial encounter  Hematoma    ED Discharge Orders    None       Lennice Sites, DO 07/08/18 1143

## 2018-07-08 NOTE — ED Triage Notes (Signed)
Patient reports that he was reaching down to pick up his shoes and he fell face forward.

## 2018-07-12 ENCOUNTER — Emergency Department (HOSPITAL_COMMUNITY): Payer: Medicare Other

## 2018-07-12 ENCOUNTER — Other Ambulatory Visit: Payer: Self-pay | Admitting: Internal Medicine

## 2018-07-12 ENCOUNTER — Inpatient Hospital Stay (HOSPITAL_COMMUNITY)
Admission: EM | Admit: 2018-07-12 | Discharge: 2018-07-15 | DRG: 554 | Disposition: A | Discharge status: Skilled Nursing Facility | Payer: Medicare Other | Attending: Oncology | Admitting: Oncology

## 2018-07-12 ENCOUNTER — Encounter (HOSPITAL_COMMUNITY): Payer: Self-pay

## 2018-07-12 DIAGNOSIS — I878 Other specified disorders of veins: Secondary | ICD-10-CM | POA: Diagnosis not present

## 2018-07-12 DIAGNOSIS — R2681 Unsteadiness on feet: Secondary | ICD-10-CM

## 2018-07-12 DIAGNOSIS — W19XXXD Unspecified fall, subsequent encounter: Secondary | ICD-10-CM | POA: Diagnosis not present

## 2018-07-12 DIAGNOSIS — J45909 Unspecified asthma, uncomplicated: Secondary | ICD-10-CM | POA: Diagnosis present

## 2018-07-12 DIAGNOSIS — S0012XA Contusion of left eyelid and periocular area, initial encounter: Secondary | ICD-10-CM

## 2018-07-12 DIAGNOSIS — W1830XA Fall on same level, unspecified, initial encounter: Secondary | ICD-10-CM | POA: Diagnosis present

## 2018-07-12 DIAGNOSIS — R4182 Altered mental status, unspecified: Secondary | ICD-10-CM | POA: Diagnosis not present

## 2018-07-12 DIAGNOSIS — M25562 Pain in left knee: Secondary | ICD-10-CM | POA: Diagnosis not present

## 2018-07-12 DIAGNOSIS — S0003XA Contusion of scalp, initial encounter: Secondary | ICD-10-CM | POA: Diagnosis not present

## 2018-07-12 DIAGNOSIS — I2510 Atherosclerotic heart disease of native coronary artery without angina pectoris: Secondary | ICD-10-CM | POA: Diagnosis present

## 2018-07-12 DIAGNOSIS — S299XXA Unspecified injury of thorax, initial encounter: Secondary | ICD-10-CM | POA: Diagnosis not present

## 2018-07-12 DIAGNOSIS — Z9181 History of falling: Secondary | ICD-10-CM

## 2018-07-12 DIAGNOSIS — R441 Visual hallucinations: Secondary | ICD-10-CM | POA: Diagnosis not present

## 2018-07-12 DIAGNOSIS — Z8701 Personal history of pneumonia (recurrent): Secondary | ICD-10-CM | POA: Diagnosis not present

## 2018-07-12 DIAGNOSIS — E662 Morbid (severe) obesity with alveolar hypoventilation: Secondary | ICD-10-CM

## 2018-07-12 DIAGNOSIS — I130 Hypertensive heart and chronic kidney disease with heart failure and stage 1 through stage 4 chronic kidney disease, or unspecified chronic kidney disease: Secondary | ICD-10-CM | POA: Diagnosis not present

## 2018-07-12 DIAGNOSIS — M1A0620 Idiopathic chronic gout, left knee, without tophus (tophi): Secondary | ICD-10-CM

## 2018-07-12 DIAGNOSIS — I480 Paroxysmal atrial fibrillation: Secondary | ICD-10-CM | POA: Diagnosis present

## 2018-07-12 DIAGNOSIS — E039 Hypothyroidism, unspecified: Secondary | ICD-10-CM | POA: Diagnosis present

## 2018-07-12 DIAGNOSIS — G8929 Other chronic pain: Secondary | ICD-10-CM

## 2018-07-12 DIAGNOSIS — Z7951 Long term (current) use of inhaled steroids: Secondary | ICD-10-CM

## 2018-07-12 DIAGNOSIS — Z905 Acquired absence of kidney: Secondary | ICD-10-CM

## 2018-07-12 DIAGNOSIS — Z95810 Presence of automatic (implantable) cardiac defibrillator: Secondary | ICD-10-CM

## 2018-07-12 DIAGNOSIS — G4733 Obstructive sleep apnea (adult) (pediatric): Secondary | ICD-10-CM

## 2018-07-12 DIAGNOSIS — M109 Gout, unspecified: Secondary | ICD-10-CM | POA: Diagnosis not present

## 2018-07-12 DIAGNOSIS — R05 Cough: Secondary | ICD-10-CM | POA: Diagnosis not present

## 2018-07-12 DIAGNOSIS — F419 Anxiety disorder, unspecified: Secondary | ICD-10-CM | POA: Diagnosis present

## 2018-07-12 DIAGNOSIS — Z7901 Long term (current) use of anticoagulants: Secondary | ICD-10-CM

## 2018-07-12 DIAGNOSIS — M170 Bilateral primary osteoarthritis of knee: Principal | ICD-10-CM | POA: Diagnosis present

## 2018-07-12 DIAGNOSIS — I252 Old myocardial infarction: Secondary | ICD-10-CM

## 2018-07-12 DIAGNOSIS — Z888 Allergy status to other drugs, medicaments and biological substances status: Secondary | ICD-10-CM

## 2018-07-12 DIAGNOSIS — F29 Unspecified psychosis not due to a substance or known physiological condition: Secondary | ICD-10-CM | POA: Diagnosis not present

## 2018-07-12 DIAGNOSIS — Q600 Renal agenesis, unilateral: Secondary | ICD-10-CM

## 2018-07-12 DIAGNOSIS — N183 Chronic kidney disease, stage 3 (moderate): Secondary | ICD-10-CM | POA: Diagnosis present

## 2018-07-12 DIAGNOSIS — R200 Anesthesia of skin: Secondary | ICD-10-CM | POA: Diagnosis not present

## 2018-07-12 DIAGNOSIS — R001 Bradycardia, unspecified: Secondary | ICD-10-CM | POA: Diagnosis not present

## 2018-07-12 DIAGNOSIS — Z972 Presence of dental prosthetic device (complete) (partial): Secondary | ICD-10-CM

## 2018-07-12 DIAGNOSIS — R531 Weakness: Secondary | ICD-10-CM

## 2018-07-12 DIAGNOSIS — I5032 Chronic diastolic (congestive) heart failure: Secondary | ICD-10-CM

## 2018-07-12 DIAGNOSIS — R296 Repeated falls: Secondary | ICD-10-CM | POA: Diagnosis present

## 2018-07-12 DIAGNOSIS — E785 Hyperlipidemia, unspecified: Secondary | ICD-10-CM | POA: Diagnosis not present

## 2018-07-12 DIAGNOSIS — M25462 Effusion, left knee: Secondary | ICD-10-CM | POA: Diagnosis not present

## 2018-07-12 DIAGNOSIS — E538 Deficiency of other specified B group vitamins: Secondary | ICD-10-CM | POA: Diagnosis not present

## 2018-07-12 DIAGNOSIS — I10 Essential (primary) hypertension: Secondary | ICD-10-CM

## 2018-07-12 DIAGNOSIS — M1712 Unilateral primary osteoarthritis, left knee: Secondary | ICD-10-CM | POA: Diagnosis not present

## 2018-07-12 DIAGNOSIS — E669 Obesity, unspecified: Secondary | ICD-10-CM | POA: Diagnosis present

## 2018-07-12 DIAGNOSIS — R440 Auditory hallucinations: Secondary | ICD-10-CM | POA: Diagnosis not present

## 2018-07-12 DIAGNOSIS — W19XXXA Unspecified fall, initial encounter: Secondary | ICD-10-CM | POA: Diagnosis not present

## 2018-07-12 DIAGNOSIS — Z79890 Hormone replacement therapy (postmenopausal): Secondary | ICD-10-CM

## 2018-07-12 DIAGNOSIS — Z79899 Other long term (current) drug therapy: Secondary | ICD-10-CM

## 2018-07-12 LAB — BASIC METABOLIC PANEL
Anion gap: 9 (ref 5–15)
BUN: 31 mg/dL — ABNORMAL HIGH (ref 8–23)
CO2: 26 mmol/L (ref 22–32)
CREATININE: 1.48 mg/dL — AB (ref 0.61–1.24)
Calcium: 9.1 mg/dL (ref 8.9–10.3)
Chloride: 108 mmol/L (ref 98–111)
GFR calc Af Amer: 53 mL/min — ABNORMAL LOW (ref >60)
GFR calc non Af Amer: 46 mL/min — ABNORMAL LOW (ref >60)
Glucose, Bld: 87 mg/dL (ref 70–99)
Potassium: 3.6 mmol/L (ref 3.5–5.1)
Sodium: 143 mmol/L (ref 135–145)

## 2018-07-12 LAB — URINALYSIS, ROUTINE W REFLEX MICROSCOPIC
Bilirubin Urine: NEGATIVE
Glucose, UA: NEGATIVE mg/dL
Hgb urine dipstick: NEGATIVE
Ketones, ur: NEGATIVE mg/dL
LEUKOCYTES UA: NEGATIVE
Nitrite: NEGATIVE
Protein, ur: NEGATIVE mg/dL
SPECIFIC GRAVITY, URINE: 1.01 (ref 1.005–1.030)
pH: 5 (ref 5.0–8.0)

## 2018-07-12 LAB — CBC
HCT: 35.6 % — ABNORMAL LOW (ref 39.0–52.0)
Hemoglobin: 11.3 g/dL — ABNORMAL LOW (ref 13.0–17.0)
MCH: 29 pg (ref 26.0–34.0)
MCHC: 31.7 g/dL (ref 30.0–36.0)
MCV: 91.5 fL (ref 80.0–100.0)
NRBC: 0 % (ref 0.0–0.2)
Platelets: 223 10*3/uL (ref 150–400)
RBC: 3.89 MIL/uL — ABNORMAL LOW (ref 4.22–5.81)
RDW: 14.5 % (ref 11.5–15.5)
WBC: 6.3 10*3/uL (ref 4.0–10.5)

## 2018-07-12 MED ORDER — CLONAZEPAM 0.25 MG PO TBDP
0.2500 mg | ORAL_TABLET | Freq: Every day | ORAL | Status: DC
  Administered 2018-07-12 – 2018-07-14 (×3): 0.25 mg via ORAL
  Filled 2018-07-12 (×3): qty 1

## 2018-07-12 MED ORDER — ACETAMINOPHEN 325 MG PO TABS
650.0000 mg | ORAL_TABLET | Freq: Four times a day (QID) | ORAL | Status: DC | PRN
  Administered 2018-07-12 – 2018-07-13 (×2): 650 mg via ORAL
  Filled 2018-07-12 (×2): qty 2

## 2018-07-12 MED ORDER — SODIUM CHLORIDE 0.9 % IV BOLUS
500.0000 mL | Freq: Once | INTRAVENOUS | Status: AC
  Administered 2018-07-12: 500 mL via INTRAVENOUS

## 2018-07-12 MED ORDER — POTASSIUM CHLORIDE CRYS ER 20 MEQ PO TBCR
20.0000 meq | EXTENDED_RELEASE_TABLET | ORAL | Status: DC
  Administered 2018-07-13 – 2018-07-15 (×2): 20 meq via ORAL
  Filled 2018-07-12 (×2): qty 1

## 2018-07-12 MED ORDER — SODIUM CHLORIDE 0.9% FLUSH: 3.0000 mL | INTRAVENOUS | Status: DC | PRN

## 2018-07-12 MED ORDER — ALBUTEROL SULFATE (2.5 MG/3ML) 0.083% IN NEBU: 2.5000 mg | INHALATION_SOLUTION | RESPIRATORY_TRACT | Status: DC | PRN

## 2018-07-12 MED ORDER — ISOSORBIDE MONONITRATE ER 30 MG PO TB24
60.0000 mg | ORAL_TABLET | Freq: Every day | ORAL | Status: DC
  Administered 2018-07-13 – 2018-07-15 (×3): 60 mg via ORAL
  Filled 2018-07-12 (×3): qty 2

## 2018-07-12 MED ORDER — LEVOTHYROXINE SODIUM 25 MCG PO TABS
125.0000 ug | ORAL_TABLET | Freq: Every day | ORAL | Status: DC
  Administered 2018-07-13 – 2018-07-15 (×3): 125 ug via ORAL
  Filled 2018-07-12 (×3): qty 1

## 2018-07-12 MED ORDER — LORATADINE 10 MG PO TABS
10.0000 mg | ORAL_TABLET | Freq: Every morning | ORAL | Status: DC
  Administered 2018-07-13 – 2018-07-15 (×3): 10 mg via ORAL
  Filled 2018-07-12 (×3): qty 1

## 2018-07-12 MED ORDER — APIXABAN 5 MG PO TABS
5.0000 mg | ORAL_TABLET | Freq: Two times a day (BID) | ORAL | Status: DC
  Administered 2018-07-12 – 2018-07-15 (×6): 5 mg via ORAL
  Filled 2018-07-12 (×6): qty 1

## 2018-07-12 MED ORDER — ALLOPURINOL 100 MG PO TABS
100.0000 mg | ORAL_TABLET | Freq: Every day | ORAL | Status: DC
  Administered 2018-07-13 – 2018-07-15 (×3): 100 mg via ORAL
  Filled 2018-07-12 (×3): qty 1

## 2018-07-12 MED ORDER — HYDRALAZINE HCL 25 MG PO TABS
25.0000 mg | ORAL_TABLET | Freq: Two times a day (BID) | ORAL | Status: DC
  Administered 2018-07-12 – 2018-07-15 (×5): 25 mg via ORAL
  Filled 2018-07-12 (×6): qty 1

## 2018-07-12 MED ORDER — FENOFIBRATE 160 MG PO TABS
160.0000 mg | ORAL_TABLET | Freq: Every day | ORAL | Status: DC
  Administered 2018-07-13 – 2018-07-15 (×3): 160 mg via ORAL
  Filled 2018-07-12 (×3): qty 1

## 2018-07-12 MED ORDER — FUROSEMIDE 20 MG PO TABS
80.0000 mg | ORAL_TABLET | Freq: Every day | ORAL | Status: DC
  Administered 2018-07-13 – 2018-07-15 (×3): 80 mg via ORAL
  Filled 2018-07-12 (×3): qty 4

## 2018-07-12 MED ORDER — LOSARTAN POTASSIUM 50 MG PO TABS
100.0000 mg | ORAL_TABLET | Freq: Every day | ORAL | Status: DC
  Administered 2018-07-13 – 2018-07-15 (×3): 100 mg via ORAL
  Filled 2018-07-12 (×3): qty 2

## 2018-07-12 MED ORDER — SODIUM CHLORIDE 0.9 % IV SOLN: 250.0000 mL | INTRAVENOUS | Status: DC | PRN

## 2018-07-12 MED ORDER — AMIODARONE HCL 200 MG PO TABS
200.0000 mg | ORAL_TABLET | Freq: Every day | ORAL | Status: DC
  Administered 2018-07-13 – 2018-07-15 (×3): 200 mg via ORAL
  Filled 2018-07-12 (×3): qty 1

## 2018-07-12 MED ORDER — SODIUM CHLORIDE 0.9% FLUSH
3.0000 mL | Freq: Two times a day (BID) | INTRAVENOUS | Status: DC
  Administered 2018-07-12 – 2018-07-14 (×4): 3 mL via INTRAVENOUS

## 2018-07-12 MED ORDER — ACETAMINOPHEN 650 MG RE SUPP: 650.0000 mg | Freq: Four times a day (QID) | RECTAL | Status: DC | PRN

## 2018-07-12 MED ORDER — SODIUM CHLORIDE 0.9% FLUSH
3.0000 mL | Freq: Two times a day (BID) | INTRAVENOUS | Status: DC
  Administered 2018-07-12 – 2018-07-15 (×6): 3 mL via INTRAVENOUS

## 2018-07-12 MED ORDER — DICLOFENAC SODIUM 1 % TD GEL
2.0000 g | Freq: Four times a day (QID) | TRANSDERMAL | Status: DC
  Administered 2018-07-12 – 2018-07-15 (×10): 2 g via TOPICAL
  Filled 2018-07-12: qty 100

## 2018-07-12 MED ORDER — SODIUM CHLORIDE 0.9% FLUSH
3.0000 mL | Freq: Once | INTRAVENOUS | Status: AC
  Administered 2018-07-12: 3 mL via INTRAVENOUS

## 2018-07-12 MED ORDER — SENNOSIDES-DOCUSATE SODIUM 8.6-50 MG PO TABS: 1.0000 | ORAL_TABLET | Freq: Every evening | ORAL | Status: DC | PRN

## 2018-07-12 MED ORDER — MOMETASONE FURO-FORMOTEROL FUM 200-5 MCG/ACT IN AERO
2.0000 | INHALATION_SPRAY | Freq: Two times a day (BID) | RESPIRATORY_TRACT | Status: DC
  Administered 2018-07-12 – 2018-07-15 (×6): 2 via RESPIRATORY_TRACT
  Filled 2018-07-12: qty 8.8

## 2018-07-12 MED ORDER — FLUTICASONE PROPIONATE 50 MCG/ACT NA SUSP
2.0000 | Freq: Every day | NASAL | Status: DC | PRN
  Filled 2018-07-12: qty 16

## 2018-07-12 NOTE — Consult Note (Addendum)
Neurology Consultation  Reason for Consult: Left leg weakness  Referring Physician: Dr. Lita Mains  CC: Left leg weakness  History is obtained from: Patient and chart  HPI: Cody Faulkner is a 73 y.o. male with significant past medical history of ventricular tachycardia, urinary incontinence, shortness of breath, pacemaker, peripheral edema, myocardial infarct, joint swelling, joint pain, hypertension, hyperlipidemia, heart failure, dizziness, dilated cardiomyopathy, chronic kidney disease and CAD.  Patient at home walks with a cane and states that his left leg is the stronger of the 2.  He does have a 30 degree contraction of his left leg but states that this has been there for some time.  He is on Eliquis for his atrial fibrillation.  Yesterday apparently he had fallen down and bruised his face, per his cousin who is in the room. His left knee "popped" during the fall and has become more painful relative to his chronic baseline of left knee pain with partial flexion contracture at the knee. When he woke up this morning and noted that his left leg was weaker and in addition he felt that it was slightly numb on initial interview. The patient is a poor/reluctant historian and on further interview does not endorse any definite leg numbness, but states that he has been "weak all over" which may have precipitated his fall. He also endorses difficulty ambulating due to worsened left knee and leg pain following the fall.  Currently patient is stating he has significant pain in his left knee and also in his groin.  CT was obtained which showed no acute intracranial pathology.  Unfortunately he is unable to get an MRI secondary to pacemaker.  ROS:  ROS was performed and is negative except as noted in the HPI.   Past Medical History:  Diagnosis Date  . Asthma    Albuterol prn;Symbicort daily  . Atrial fibrillation ([place])    takes COumadin daily  . Bruises easily    d/t being on Coumadin  . CAD (coronary  artery disease)    predominantly single vessel  . Cardiomyopathy, ischemic   . Cataracts, bilateral to be removed in Aug 2015  . Chronic kidney disease 1976   S/P nephrectomy  . Constipation    takes Colace daily as needed  . Depression   . Dilated cardiomyopathy ([place])    s/p defibrillator [employer] [phone]  . Dizziness    occasionally  . GERD (gastroesophageal reflux disease)    takes OTC med if needed  . Gout    takes Allopurinol daily  . Heart failure ([place])    NYHFA     CLASS 3  . History of hyperthyroidism   . Hyperlipidemia    takes Fenofibrate daily  . Hypertension    takes Bisoprolol daily as well as Imdur  . Hypothyroidism    takes Synthroid daily  . Joint pain   . Joint swelling   . Left knee DJD    needs surgery  . Myocardial infarction ([place]) 2007  . OSA (obstructive sleep apnea)    "don't wear mask; can't afford one" (12/20/2013)  . Pacemaker   . Peripheral edema    takes Furosemide daily  . Pneumonia, community acquired last time 2014   "get it ~ q yr; haven't had it yet in 2015" (12/20/2013)  . Seasonal allergies    takes Claritin daily  . Shortness of breath    with exertion  . Solitary kidney 06/05/2006  . Urinary frequency   . Urinary incontinence   .  Urinary urgency   . Ventricular tachycardia [facility])    s/p defibrillator-[employer] [id number]    Family History  Problem Relation Age of Onset  . Heart disease Other        uncle  . Diabetes Other        uncle     Social History:   reports that he has never smoked. He has never used smokeless tobacco. He reports current alcohol use. He reports that he does not use drugs.  Medications No current facility-administered medications for this encounter.   Current Outpatient Medications:  .  acetaminophen (TYLENOL) 500 MG tablet, Take 500 mg by mouth every 6 (six) hours as needed for moderate pain. , Disp: , Rfl:  .  albuterol (PROVENTIL HFA;VENTOLIN HFA) 108 (90 BASE) MCG/ACT inhaler,  Inhale 2 puffs into the lungs every 4 (four) hours as needed for shortness of breath. , Disp: , Rfl:  .  allopurinol (ZYLOPRIM) 100 MG tablet, Take 100 mg by mouth daily. , Disp: , Rfl:  .  amiodarone (PACERONE) 200 MG tablet, Take 1 tablet (200 mg total) by mouth daily., Disp: 30 tablet, Rfl: 11 .  budesonide-formoterol (SYMBICORT) 160-4.5 MCG/ACT inhaler, Inhale 2 puffs into the lungs 2 (two) times daily., Disp: , Rfl:  .  Cholecalciferol (VITAMIN D) 2000 UNITS tablet, Take 2,000 Units by mouth daily., Disp: , Rfl:  .  clonazePAM (KLONOPIN) 0.5 MG tablet, Take 0.25-0.5 mg by mouth See admin instructions. Take 1 tablet by mouth at bedtime for sleep and 1/2 tablet in the morning for anxiety, Disp: , Rfl:  .  docusate sodium (COLACE) 100 MG capsule, Take 100 mg by mouth daily as needed for mild constipation or moderate constipation. , Disp: , Rfl:  .  ELIQUIS 5 MG TABS tablet, TAKE 1 TABLET BY MOUTH TWICE A DAY (Patient taking differently: Take 5 mg by mouth 2 (two) times daily. ), Disp: 60 tablet, Rfl: 5 .  fenofibrate 160 MG tablet, Take 160 mg by mouth daily., Disp: , Rfl:  .  fluticasone (FLONASE) 50 MCG/ACT nasal spray, Place 2 sprays into both nostrils daily as needed for allergies. , Disp: , Rfl:  .  furosemide (LASIX) 80 MG tablet, Take 1 tablet (80 mg total) by mouth daily., Disp: 30 tablet, Rfl: 6 .  hydrALAZINE (APRESOLINE) 25 MG tablet, Take 1 tablet (25 mg total) by mouth 2 (two) times daily., Disp: 60 tablet, Rfl: 11 .  isosorbide mononitrate (IMDUR) 60 MG 24 hr tablet, Take 1 tablet (60 mg total) by mouth daily., Disp: 30 tablet, Rfl: 11 .  loratadine (CLARITIN) 10 MG tablet, Take 10 mg by mouth every morning. , Disp: , Rfl:  .  losartan (COZAAR) 100 MG tablet, Take 100 mg by mouth daily., Disp: , Rfl:  .  potassium chloride SA (K-DUR,KLOR-CON) 20 MEQ tablet, Take 1 tablet (20 mEq total) by mouth every other day., Disp: 15 tablet, Rfl: 0 .  SYNTHROID 125 MCG tablet, Take 125 mcg by  mouth daily., Disp: , Rfl: 0 .  triamcinolone cream (KENALOG) 0.1 %, Apply 1 application topically as needed (on affected area(s)on skin). , Disp: , Rfl: 0   Exam: Current vital signs: BP 123/74   Pulse (!) 51   Temp 98.1 F (36.7 C) (Oral)   Resp 15   SpO2 98%  Vital signs in last 24 hours: Temp:  [98.1 F (36.7 C)] 98.1 F (36.7 C) (02/04 0938) Pulse Rate:  [47-57] 51 (02/04 1500) Resp:  [11-18] 15 (  02/04 1500) BP: (112-160)/(58-83) 123/74 (02/04 1500) SpO2:  [96 %-100 %] 98 % (02/04 1500)  Physical Exam  Psych: Dysthymic affect HEENT: Extensive bruising and swelling to left side of face. Left eye swollen shut.  Lungs: Respirations unlabored.  Respiratory: Effort normal, non-labored breathing.  Skin: WDI with significant hemosiderin staining in his bilateral stocking distribution Lower extremities: See below  Neuro: Mental Status: Patient is awake, alert, oriented to person, place, month,  Patient is able some history but not a clear coherent full history No signs of aphasia or neglect Cranial Nerves: II: Visual Fields are full in right eye. Unable to test left eye due to swollen eyelid..  III,IV, VI: EOMI with swelling of left eyelid secondary to significant hematoma . Pupils are equal, round, and reactive to light.   V: Facial sensation is symmetric to temperature VII: Facial movement is symmetric.  VIII: hearing is intact to voice X: No hypophonia XI: Shoulder shrug is symmetric. XII: tongue is midline  Motor: Patient has 5/5 strength bilaeral upper extremities.  RLE: 4+/5 thigh flexion and knee extension. Not cooperative with foot strength testing.  LLE: 4/5 thigh flexion transiently when coached, but gives way rapidly due to knee pain. Unable to test flexion or extension at the knee due to severely decreased mobility of the left swollen knee joint with pain on movement. He does have a 30 degree flexion contraction with significant enlargement of the left knee  which appears to be as combination of bony hypertrophy and soft tissue edema.  Sensory: Sensation is symmetric in upper extremities with stocking distribution decreased sensation lower extremities Deep Tendon Reflexes: 2+ and symmetric upper extremities with no knee jerks or Achilles reflexes elicitable Plantars: Mute bilaterally Cerebellar: FNF intact bilaterally  Labs I have reviewed labs in epic and the results pertinent to this consultation are:   CBC    Component Value Date/Time   WBC 6.3 07/12/2018 0955   RBC 3.89 (L) 07/12/2018 0955   HGB 11.3 (L) 07/12/2018 0955   HCT 35.6 (L) 07/12/2018 0955   PLT 223 07/12/2018 0955   MCV 91.5 07/12/2018 0955   MCH 29.0 07/12/2018 0955   MCHC 31.7 07/12/2018 0955   RDW 14.5 07/12/2018 0955   RDW 15.2 12/04/2013 1629   LYMPHSABS 1.2 07/08/2018 0938   LYMPHSABS 1.7 12/04/2013 1629   MONOABS 0.7 07/08/2018 0938   EOSABS 0.2 07/08/2018 0938   EOSABS 0.1 12/04/2013 1629   BASOSABS 0.0 07/08/2018 0938   BASOSABS 0.0 12/04/2013 1629    CMP     Component Value Date/Time   NA 143 07/12/2018 0955   K 3.6 07/12/2018 0955   CL 108 07/12/2018 0955   CO2 26 07/12/2018 0955   GLUCOSE 87 07/12/2018 0955   BUN 31 (H) 07/12/2018 0955   CREATININE 1.48 (H) 07/12/2018 0955   CALCIUM 9.1 07/12/2018 0955   PROT 6.3 (L) 05/30/2018 1002   ALBUMIN 3.5 05/30/2018 1002   AST 21 05/30/2018 1002   ALT 15 05/30/2018 1002   ALKPHOS 43 05/30/2018 1002   BILITOT 1.3 (H) 05/30/2018 1002   GFRNONAA 46 (L) 07/12/2018 0955   GFRAA 53 (L) 07/12/2018 0955    Lipid Panel  No results found for: CHOL, TRIG, HDL, CHOLHDL, VLDL, LDLCALC, LDLDIRECT   Imaging I have reviewed the images obtained:  CT-scan of the brain-no acute intracranial pathology  Etta Quill PA-C [facility] [phone] 07/12/2018, 6:07 PM     Assessment:  73 year old male with weakness of left leg likely  secondary to pain and limited range of motion, possibly  worsened by recent fall. 1. Overall appearance is more consistent with structural/rheumatologic etiology than neurological compromise. He can briefly give nearly full effort in thigh flexion on the left, but gives way rapidly due to pain.   2. CT shows no acute intracranial pathology.   3. Patient likely would need a full knee replacement on the left leg in order to increase ambulation and strength.  He will also need significant physical therapy to increase his strength and balance.. May need a rheumatology and/or orthopedics consult 4. Atrial fibrillation on Eliquis. Overall exam findings not suggestive of an ACA stroke.    Recommendations: - PT - Consider Orthopedics consult for left knee replacement given his flexion contracture and significant decrease in left knee medial compartment on plain films, along with osteophytic changes in the MCL, LCL. -- Consider Rheumatology consult if warranted after Orthopedics consult - Repeat CT head in 3 days to assess for unlikely but possible right ACA stroke not detectable on initial CT --Unable to perform MRI due to pacmaker  I have interviewed and examined the patient. I have added pertinent additional findings to the documented neurological exam. 73 year old male with severe left knee pain and decreased ability to ambulate following a fall. Etiology most likely structural/rheumatologic. No additional neurological testing recommended at this time.  Electronically signed: Dr. Kerney Elbe

## 2018-07-12 NOTE — ED Triage Notes (Signed)
Pt presents for evaluation of L leg tingling and bilateral feet tingling since last night. States has had left leg weakness x 2 weeks.

## 2018-07-12 NOTE — ED Notes (Signed)
Patient transported to CT 

## 2018-07-12 NOTE — H&P (Addendum)
Date: 07/12/2018               Patient Name:  Cody Faulkner MRN: [id number]  DOB: 10/20/1945 Age / Sex: 73 y.o., male   PCP: Cyndi Bender, PA-C         Medical Service: Internal Medicine Teaching Service         Attending Physician: Dr. Annia Belt, MD    First Contact: Dr. Gilberto Better Pager: [phone]  Second Contact: Dr. Pearson Grippe Pager: [phone]       After Hours (After 5p/  First Contact Pager: [phone]  weekends / holidays): Second Contact Pager: [phone]   Chief Complaint: Fall  History of Present Illness: Mr.Cody Faulkner is a 73yo M w/ PMH of Paroxysmal A.fib on Eliquis, HFpEF s/p ICD, CKD3a, OSA, DJD and HLD presenting with fall. He was in his usual state of health until yesterday morning when he fell while entering his truck. He states his left leg has 'bad arthritis' and when trying to climb up on his truck, his left knee buckled and gave way and he fell backwards with trauma to the head. He states this is not the first time he fell and he fell forward and hit his face last week. He states both times he had no LOC, chest pain, palpitations, neurological deficits, or focal weakness. He mentions some pinprick sensation after fall of his left foot but no sensory deficits. He states at baseline he ambulates with walker at home and cane outside home but states he avoids using his cane due to ambulatory dysfunction. He states he has been experiencing pretty severe knee pain since the fall and wanted to come to the ED for evaluation.  Chart review confirms his prior episode of fall on 07/08/18 with visit to the ED at that time. He was evaluated with head CT and ultrasound which showed no lens dislocation or retrobulbar hematoma and was discharged home with recommendation to ice and use Tylenol.   In the ED for this admission, he was found to have left lower extremity weakness and neurology was consulted for stroke work up. He also had X-ray of his left knee which showed severe  degenerative joint disease and CT head showed large superficial hematoma of his left forehead. IM was consulted for admission.  Meds:  Tylenol 500mg  q6hr PRN Proventil 108 2 puffs q4hr PRN Allopurinol 100mg  daily Amiodarone 200mg  daily Eliquis 5mg  BID Symbicort 160-4.5 mcg/act 2 puffs BID Vitamin D 2000 units daily Klonopin 0.5mg  daily Fenofibrate 160mg  daily Colace 100mg  daily Flonase 50 mcg/act nasal spray Furosemide 80mg  daily Hydralazine 25mg  BID Imdur 60mg  daily Levothyroxine 181mcg daily Losartan 100mg  daily K-dur 14mEq daily  Allergies: Allergies as of 07/12/2018 - Review Complete 07/12/2018  Allergen Reaction Noted  . Methimazole [methimazole] Itching and Rash 12/17/2011   Past Medical History:  Diagnosis Date  . Asthma    Albuterol prn;Symbicort daily  . Atrial fibrillation ([place])    takes COumadin daily  . Bruises easily    d/t being on Coumadin  . CAD (coronary artery disease)    predominantly single vessel  . Cardiomyopathy, ischemic   . Cataracts, bilateral to be removed in Aug 2015  . Chronic kidney disease 1976   S/P nephrectomy  . Constipation    takes Colace daily as needed  . Depression   . Dilated cardiomyopathy ([place])    s/p defibrillator [employer] [phone]  . Dizziness    occasionally  . GERD (gastroesophageal reflux disease)  takes OTC med if needed  . Gout    takes Allopurinol daily  . Heart failure ([place])    NYHFA     CLASS 3  . History of hyperthyroidism   . Hyperlipidemia    takes Fenofibrate daily  . Hypertension    takes Bisoprolol daily as well as Imdur  . Hypothyroidism    takes Synthroid daily  . Joint pain   . Joint swelling   . Left knee DJD    needs surgery  . Myocardial infarction ([place]) 2007  . OSA (obstructive sleep apnea)    "don't wear mask; can't afford one" (12/20/2013)  . Pacemaker   . Peripheral edema    takes Furosemide daily  . Pneumonia, community acquired last time 2014   "get it ~ q yr; haven't had  it yet in 2015" (12/20/2013)  . Seasonal allergies    takes Claritin daily  . Shortness of breath    with exertion  . Solitary kidney 06/05/2006  . Urinary frequency   . Urinary incontinence   . Urinary urgency   . Ventricular tachycardia [facility])    s/p defibrillator-[employer] [id number]    Family History:  Unable to provide family history. States parents passed away when he was young adult and he is unaware of their medical history. He does not have any siblings or children  Social History: [place]. Currently living alone at home retired. Denies tobacco, alcohol, smoking history. Ambulates with walker at baseline.  Review of Systems: A complete ROS was negative except as per HPI.  Physical Exam: Blood pressure (!) 161/72, pulse 63, temperature 98.1 F (36.7 C), temperature source Oral, resp. rate 18, SpO2 93 %.  Gen: Well-developed, well nourished, NAD HEENT: ~5cm diameter hematoma with significant surrounding edema above left eyebrow with healing scar in center. Left temporal head indentation noted on skull. Left eye unable to be opened voluntarily due to edema. Wearing dentures Neck: supple, ROM intact CV: RRR, S1, S2 normal, No rubs, no murmurs, no gallops Pulm: CTAB, No rales, no wheezes  Abd: Soft, BS+, NTND, No rebound, no guarding Extm: ROM intact, Peripheral pulses intact, Severe arthritic changes to bilateral knees with bony protrusions and crepitus on movement. Right lower extremity appear larger than left. No pitting edema. Skin: Dry, Warm, hyperpigmentation of lower extremities from ankle up to mid shin consistent with chronic venous stasis changes. Neuro Mental status: A&Ox3 Cranial Nerves:             II: Anisocoria with smaller left pupil             III, IV, VI: Extra-occular motions intact bilaterally             V, VII: Face symmetric, sensation intact in all 3 divisions               VIII: hearing normal to rubbing fingers bilaterally                IX, X: palate rises symmetrically             XI: Head turn and shoulder shrug normal bilaterally               XII: tongue midline    Motor: Strength 5/5 on all upper and lower extremities, bulk muscle and tone are normal  Deep Tendon Reflexes: 2+ symmetric Sensory: Light touch intact and symmetric bilaterally  Psychiatric: Normal mood and affect  EKG: personally reviewed my interpretation is sinus  bradycardia, left axis deviation, flattened T waves on septal leads, no ST elevation or depression. No significant change from prior.  CXR: personally reviewed my interpretation is rotated film, ICD visualized, widened mediastinum with tortuous aorta, no lobar consolidation, no pulmonary edema, no pleural effusion.  Assessment & Plan by Problem: Active Problems:   Weakness  Mr.Ziolkowski is a 73yo M w/ PMH of Paroxysmal A.fib on Eliquis, HFpEF s/p ICD, CKD3a, OSA, DJD and HLD admit after fall. Left extremity weakness was concerning for stroke and awaiting neurology recommendations. He had significant degenerative joint disease on physical exam which most likely contributed to his fall and pain. Will admit for assessment of his joint disease and head trauma.  Mechanical fall 2/2 deconditioning and degenerative joint disease Hx of recent falls resulting in head to trauma. CT head significant for superficial hematoma but no intracranial mass or hemorrhage. Contraindicated for MRI due to incompatible ICD. Current neurological exam benign. Knee X-ray show severe degenerative joint disease and mild joint effusion but no fracture or dislocation. Neurology on board but suspects low likelihood of stroke.  - Appreciate neurology recs: no further neurological testing - Tylenol, Voltaren gel for pain - Neurochecks - PT eval and treat - Can f/u with ortho as outpatient  Hx of Paroxysmal A.Fib  On eliquis 5 mg daily and amiodarone 200 mg qd. Current HR 63. Anticoagulation most likely contributing to large scalp  hematoma formation but suspect no current active bleeding. Hgb 11.3 - Trend CBC - C/w home meds: Eliquis 5mg  daily, amiodarone 200mg  daily  HTN Admit bp 161/72. Currently in acute pain. - C/w home meds: hydralazine 25mg  BID, losartan 100mg  daily  Chronic diastolic heart failure Last echo from December 2019 showed LV EF 04%, grade 1 diastolic dysfunction. Currently on oral furosemide for lower extremity edema.  - C/w home meds: furosemide 80mg  daily, hydralazine 25mg  BID, imdur 60mg  daily  Hx of hypothyroidism - c/w home meds: levothyroxine 137mcg  Hx of Gout - c/w home meds: allopurinol 100mg  daily  Hx of anxiety - c/w home meds: clonazepam 0.25mg  qhs  Hx of asthma PFTs in 2013 show FEV/FVC of 79% - c/w home meds: Dulera 2 puffs BID, Albuterol for rescue  DVT prophx:  subqheparin Diet: Cardiac Bowel: Senokot Code: Full   Dispo: Admit patient to Inpatient with expected length of stay greater than 2 midnights.  Signed: [name], MD 07/12/2018, 7:35 PM  Pager: [phone]

## 2018-07-12 NOTE — ED Notes (Signed)
Attempted report x1 Staff states "they are about to have shift change and could not assign a nurse"

## 2018-07-12 NOTE — ED Provider Notes (Signed)
[facility] EMERGENCY DEPARTMENT Provider Note   CSN: [id number] Arrival date & time: 07/12/18  0909   History   Chief Complaint Chief Complaint  Patient presents with  . Weakness  . Tingling    HPI Cody Faulkner is a 73 y.o. male with a PMH of atrial fibrillation on Eliquis, ICD, CHF with EF 50-55%, and CKD with a solitary kidney presents with bilateral leg weakness onset 2 days ago. Patient reports weakness is worse on left leg. Cousin is a contributing historian. Patient reports he has had bilateral leg and feet paresthesias onset last night. Patient reports left leg was numb this morning. Patient states he fell backwards yesterday while getting into his truck due to his left leg weakness. Patient reports left knee pain after the fall. Patient states he hit the back of his head, but denies LOC. Cousin reports patient lives alone and has been having intermittent auditory hallucinations over the last 2-3 weeks. Cousin states patient reports people are trying to kill him. Patient reports a cough and congestion for 2 weeks. Cousin states patient had pneumonia a few weeks ago. Cousin states patient typically ambulates with a cane or a walker. Cousin states ambulation has worsened significantly over the past few days. Patient denies chest pain, shortness of breath, dysuria, urinary frequency, fever, back pain, chills, abdominal pain, nausea, vomiting, or diarrhea. Cardiologist is Dr. Bettina Gavia.   HPI  Past Medical History:  Diagnosis Date  . Asthma    Albuterol prn;Symbicort daily  . Atrial fibrillation ([place])    takes COumadin daily  . Bruises easily    d/t being on Coumadin  . CAD (coronary artery disease)    predominantly single vessel  . Cardiomyopathy, ischemic   . Cataracts, bilateral to be removed in Aug 2015  . Chronic kidney disease 1976   S/P nephrectomy  . Constipation    takes Colace daily as needed  . Depression   . Dilated cardiomyopathy ([place])    s/p  defibrillator [employer]Trust [phone]  . Dizziness    occasionally  . GERD (gastroesophageal reflux disease)    takes OTC med if needed  . Gout    takes Allopurinol daily  . Heart failure ([place])    NYHFA     CLASS 3  . History of hyperthyroidism   . Hyperlipidemia    takes Fenofibrate daily  . Hypertension    takes Bisoprolol daily as well as Imdur  . Hypothyroidism    takes Synthroid daily  . Joint pain   . Joint swelling   . Left knee DJD    needs surgery  . Myocardial infarction ([place]) 2007  . OSA (obstructive sleep apnea)    "don't wear mask; can't afford one" (12/20/2013)  . Pacemaker   . Peripheral edema    takes Furosemide daily  . Pneumonia, community acquired last time 2014   "get it ~ q yr; haven't had it yet in 2015" (12/20/2013)  . Seasonal allergies    takes Claritin daily  . Shortness of breath    with exertion  . Solitary kidney 06/05/2006  . Urinary frequency   . Urinary incontinence   . Urinary urgency   . Ventricular tachycardia [facility])    s/p defibrillator-Medtronic EnTrust [id number]    Patient Active Problem List   Diagnosis Date Noted  . Delirium 05/30/2018  . On amiodarone therapy 05/18/2018  . Chronic anticoagulation- Coumadin 12/23/2013  . Sinus node dysfunction ([place]) 12/21/2013  . Paroxysmal ventricular tachycardia ([place])  12/21/2013  . ICD (implantable cardioverter-defibrillator) lead failure 12/20/2013  . Obesity hypoventilation syndrome ([place]) 12/07/2012  . History of hyperthyroidism   . CKD (chronic kidney disease) stage 3, GFR 30-59 ml/min (HCC) 06/23/2011  . Morbid obesity   . Long term current use of anticoagulant therapy   . Hypertensive heart disease with heart failure ([place])   . Solitary kidney   . Community acquired pneumonia 06/06/2011  . Acute encephalopathy 06/06/2011  . Hypothyroidism   . Mild persistent asthma   . Paroxysmal atrial fibrillation (HCC)   . Automatic implantable cardioverter-defibrillator in situ   . History of  dilated cardiomyopathy- last EF 50-55% 2D July 2014   . Chronic combined systolic and diastolic heart failure ([place])   . CAD nonobstructive- 2007 11/12/2005    Past Surgical History:  Procedure Laterality Date  . CARDIAC CATHETERIZATION  2007   60% Stenosis mid-CFX  . CARDIAC DEFIBRILLATOR PLACEMENT  2007   Medtronic EnTrust [phone]  . CARDIAC DEFIBRILLATOR REMOVAL  12/20/2013  . CARDIOVERSION  06/18/2011   Procedure: CARDIOVERSION;  Surgeon: Kerry Hough., MD;  Location: [facility];  Service: Cardiovascular;  Laterality: N/A;  . CARDIOVERSION N/A 12/06/2012   Procedure: CARDIOVERSION;  Surgeon: Jacolyn Reedy, MD;  Location: [facility];  Service: Cardiovascular;  Laterality: N/A;  . COLONOSCOPY    . ICD GENERATOR CHANGE  07/2012  . ICD LEAD REMOVAL Left 12/20/2013   Procedure: ICD LEAD REMOVAL//EXTRACTION AND PLACEMENT OF TEMP/PERM ATRIAL LEAD;  Surgeon: Evans Lance, MD;  Location: [facility] [place];  Service: Cardiovascular;  Laterality: Left;  . IMPLANTABLE CARDIOVERTER DEFIBRILLATOR IMPLANT N/A 01/25/2014   Procedure: IMPLANTABLE CARDIOVERTER DEFIBRILLATOR IMPLANT;  Surgeon: Evans Lance, MD;  Location: [facility] CATH LAB;  Service: Cardiovascular;  Laterality: N/A;  . IMPLANTABLE CARDIOVERTER DEFIBRILLATOR REVISION N/A 07/13/2012   Procedure: IMPLANTABLE CARDIOVERTER DEFIBRILLATOR REVISION;  Surgeon: Deboraha Sprang, MD;  Location: [facility] CATH LAB;  Service: Cardiovascular;  Laterality: N/A;  . INSERT / REPLACE / REMOVE PACEMAKER  12/20/2013  . KNEE ARTHROSCOPY Left   . MULTIPLE TOOTH EXTRACTIONS     "top's are all gone; took some off the bottom too"  . NEPHRECTOMY  1976   Right  . TEE WITHOUT CARDIOVERSION  07/13/2012   Procedure: TRANSESOPHAGEAL ECHOCARDIOGRAM (TEE);  Surgeon: Lelon Perla, MD;  Location: [facility] ENDOSCOPY;  Service: Cardiovascular;  Laterality: N/A;        Home Medications    Prior to Admission medications   Medication Sig Start Date End Date Taking? Authorizing Provider    acetaminophen (TYLENOL) 500 MG tablet Take 500 mg by mouth every 6 (six) hours as needed for moderate pain.     [provider]  albuterol (PROVENTIL HFA;VENTOLIN HFA) 108 (90 BASE) MCG/ACT inhaler Inhale 2 puffs into the lungs every 4 (four) hours as needed for shortness of breath.     [provider]  allopurinol (ZYLOPRIM) 100 MG tablet Take 100 mg by mouth daily.  10/28/13   [provider]  amiodarone (PACERONE) 200 MG tablet Take 1 tablet (200 mg total) by mouth daily. 07/30/17   Evans Lance, MD  budesonide-formoterol [facility]) 160-4.5 MCG/ACT inhaler Inhale 2 puffs into the lungs 2 (two) times daily.    [provider]  Cholecalciferol (VITAMIN D) 2000 UNITS tablet Take 2,000 Units by mouth daily.    [provider]  clonazePAM (KLONOPIN) 0.5 MG tablet Take 0.25-0.5 mg by mouth See admin instructions. Take 1 tablet by mouth at bedtime for sleep  and 1/2 tablet in the morning for anxiety 07/06/18   [provider]  docusate sodium (COLACE) 100 MG capsule Take 100 mg by mouth daily as needed for mild constipation or moderate constipation.     [provider]  ELIQUIS 5 MG TABS tablet TAKE 1 TABLET BY MOUTH TWICE A DAY Patient taking differently: Take 5 mg by mouth 2 (two) times daily.  06/30/18   Evans Lance, MD  fenofibrate 160 MG tablet Take 160 mg by mouth daily.    [provider]  fluticasone (FLONASE) 50 MCG/ACT nasal spray Place 2 sprays into both nostrils daily as needed for allergies.     [provider]  furosemide (LASIX) 80 MG tablet Take 1 tablet (80 mg total) by mouth daily. 05/18/18   Richardo Priest, MD  hydrALAZINE (APRESOLINE) 25 MG tablet Take 1 tablet (25 mg total) by mouth 2 (two) times daily. 07/30/17   Evans Lance, MD  isosorbide mononitrate (IMDUR) 60 MG 24 hr tablet Take 1 tablet (60 mg total) by mouth daily. 07/30/17   Evans Lance, MD  loratadine (CLARITIN) 10 MG tablet Take 10  mg by mouth every morning.     [provider]  losartan (COZAAR) 100 MG tablet Take 100 mg by mouth daily.    [provider]  potassium chloride SA (K-DUR,KLOR-CON) 20 MEQ tablet Take 1 tablet (20 mEq total) by mouth every other day. 07/16/17   Evans Lance, MD  SYNTHROID 125 MCG tablet Take 125 mcg by mouth daily. 01/29/18   [provider]  triamcinolone cream (KENALOG) 0.1 % Apply 1 application topically as needed (on affected area(s)on skin).  05/04/18   [provider]    Family History Family History  Problem Relation Age of Onset  . Heart disease Other        uncle  . Diabetes Other        uncle    Social History Social History   Tobacco Use  . Smoking status: Never Smoker  . Smokeless tobacco: Never Used  Substance Use Topics  . Alcohol use: Yes    Comment: 12/20/2013 "might have drank a couple beers before; never really drank"  . Drug use: No     Allergies   Methimazole [methimazole]   Review of Systems Review of Systems  Constitutional: Negative for activity change, appetite change, chills, diaphoresis, fatigue, fever and unexpected weight change.  HENT: Positive for congestion and rhinorrhea. Negative for sore throat.   Eyes: Negative for photophobia and visual disturbance.  Respiratory: Positive for cough. Negative for chest tightness and shortness of breath.   Cardiovascular: Negative for chest pain, palpitations and leg swelling.  Gastrointestinal: Negative for abdominal pain, blood in stool, diarrhea, nausea and vomiting.  Endocrine: Negative for cold intolerance and heat intolerance.  Genitourinary: Negative for dysuria and frequency.  Musculoskeletal: Positive for arthralgias. Negative for myalgias and neck pain.  Skin: Negative for wound.  Neurological: Positive for weakness and numbness. Negative for dizziness, tremors, seizures, syncope, facial asymmetry, speech difficulty, light-headedness and headaches.    Psychiatric/Behavioral: Positive for hallucinations. Negative for confusion, dysphoric mood and sleep disturbance. The patient is not nervous/anxious.     Physical Exam Updated Vital Signs BP 123/74   Pulse (!) 51   Temp 98.1 F (36.7 C) (Oral)   Resp 15   SpO2 98%   Physical Exam Vitals signs and nursing note reviewed.  Constitutional:      General: He is not  in acute distress.    Appearance: He is well-developed. He is not diaphoretic.  HENT:     Head: Normocephalic and atraumatic.     Right Ear: Tympanic membrane, ear canal and external ear normal.     Left Ear: Tympanic membrane, ear canal and external ear normal.     Nose: Nose normal. No congestion or rhinorrhea.     Mouth/Throat:     Mouth: Mucous membranes are moist.     Pharynx: No oropharyngeal exudate or posterior oropharyngeal erythema.  Eyes:     Extraocular Movements: Extraocular movements intact.     Pupils: Pupils are equal, round, and reactive to light.  Neck:     Musculoskeletal: Normal range of motion and neck supple.  Cardiovascular:     Rate and Rhythm: Regular rhythm. Bradycardia present.     Heart sounds: Normal heart sounds. No murmur. No friction rub. No gallop.   Pulmonary:     Effort: Pulmonary effort is normal. No respiratory distress.     Breath sounds: Normal breath sounds. No wheezing or rales.  Musculoskeletal: Normal range of motion.  Skin:    General: Skin is warm.     Findings: Bruising (Large hematoma noted around left eye due to previous fall last week. ) present. No erythema or rash.  Neurological:     Mental Status: He is alert and oriented to person, place, and time.    Mental Status:  Alert, oriented, thought content appropriate, able to give a coherent history. Speech fluent without evidence of aphasia. Able to follow 2 step commands without difficulty.  Cranial Nerves:  II:  Peripheral visual fields grossly normal, pupils equal, round, reactive to light III,IV, VI: ptosis  not present, extra-ocular motions intact bilaterally  V,VII: smile symmetric, facial light touch sensation equal VIII: hearing grossly normal to voice  X: uvula elevates symmetrically  XI: bilateral shoulder shrug symmetric and strong XII: midline tongue extension without fassiculations Motor:  Normal tone. 5/5 in upper and right lower extremity bilaterally including strong and equal grip strength and dorsiflexion/plantar flexion. 4/5 strength in left lower extremity.  Sensory: light touch normal in all extremities.  Deep Tendon Reflexes: 2+ and symmetric in the biceps and patella Cerebellar: normal finger-to-nose with bilateral upper extremities Gait: patient is unable to ambulate due to weakness.  Negative pronator drift.  CV: distal pulses palpable throughout    ED Treatments / Results  Labs (all labs ordered are listed, but only abnormal results are displayed) Labs Reviewed  BASIC METABOLIC PANEL - Abnormal; Notable for the following components:      Result Value   BUN 31 (*)    Creatinine, Ser 1.48 (*)    GFR calc non Af Amer 46 (*)    GFR calc Af Amer 53 (*)    All other components within normal limits  CBC - Abnormal; Notable for the following components:   RBC 3.89 (*)    Hemoglobin 11.3 (*)    HCT 35.6 (*)    All other components within normal limits  URINALYSIS, ROUTINE W REFLEX MICROSCOPIC  CBG MONITORING, ED    EKG EKG Interpretation  Date/Time:  Tuesday July 12 2018 09:46:14 EST Ventricular Rate:  57 PR Interval:  206 QRS Duration: 102 QT Interval:  432 QTC Calculation: 420 R Axis:   -85 Text Interpretation:  Sinus bradycardia Left axis deviation Pulmonary disease pattern Abnormal ECG similar to Jul 08 2018 Confirmed by Sherwood Gambler [phone]) on 07/12/2018 1:18:01 PM   Radiology Dg  Chest 2 View  Result Date: 07/12/2018 CLINICAL DATA:  Pt c/o cough and posterior left knee pain after falling in his home yesterday. Hx of PNA, HTN, GERD, CAD, AND AFIB.  Hx of previous Pt is a nonsmoker. Hx of previous left knee surgery. EXAM: CHEST - 2 VIEW COMPARISON:  07/08/2018 FINDINGS: Heart size is normal. There is tortuosity and atherosclerotic calcification of the thoracic aorta. RIGHT-sided pacemaker leads to the RIGHT atrium and RIGHT ventricle. No focal consolidations or pleural effusions.  No pulmonary edema. IMPRESSION: No evidence for acute cardiopulmonary abnormality. Electronically Signed   By: Nolon Nations M.D.   On: 07/12/2018 14:24   Dg Knee 2 Views Left  Result Date: 07/12/2018 CLINICAL DATA:  Left knee pain after fall yesterday. EXAM: LEFT KNEE - 1-2 VIEW COMPARISON:  None. FINDINGS: No fracture or dislocation is noted. Mild suprapatellar joint effusion is noted. Severe narrowing of medial joint space is noted with osteophyte formation. Mild osteophyte formation is noted laterally. IMPRESSION: Severe degenerative joint disease is noted medially. Mild suprapatellar joint effusion is noted. No fracture or dislocation is noted. Electronically Signed   By: Marijo Conception, M.D.   On: 07/12/2018 14:24   Ct Head Wo Contrast  Result Date: 07/12/2018 CLINICAL DATA:  Head trauma, frequent falls EXAM: CT HEAD WITHOUT CONTRAST TECHNIQUE: Contiguous axial images were obtained from the base of the skull through the vertex without intravenous contrast. COMPARISON:  07/08/2018 FINDINGS: Brain: No evidence of acute infarction, hemorrhage, hydrocephalus, extra-axial collection or mass lesion/mass effect. Vascular: No hyperdense vessel or unexpected calcification. Skull: Normal. Negative for fracture or focal lesion. Sinuses/Orbits: No acute finding. Other: Large scalp hematoma of the left forehead. IMPRESSION: 1.  No acute intracranial pathology. 2.  Large scalp hematoma of the left forehead. Electronically Signed   By: Eddie Candle M.D.   On: 07/12/2018 15:42    Procedures Procedures (including critical care time)  Medications Ordered in ED Medications   sodium chloride flush (NS) 0.9 % injection 3 mL (3 mLs Intravenous Given 07/12/18 1347)  sodium chloride 0.9 % bolus 500 mL (0 mLs Intravenous Stopped 07/12/18 1635)     Initial Impression / Assessment and Plan / ED Course  I have reviewed the triage vital signs and the nursing notes.  Pertinent labs & imaging results that were available during my care of the patient were reviewed by me and considered in my medical decision making (see chart for details).  Clinical Course as of Jul 13 1811  Tue Jul 12, 2018  1427 No evidence for acute cardiopulmonary abnormality.  DG Chest 2 View [AH]  1427 Severe degenerative joint disease is noted medially. Mild suprapatellar joint effusion is noted. No fracture or dislocation is noted.    DG Knee 2 Views Left [AH]  1540 CT reveals no acute intracranial pathology. Large scalp hematoma of the left forehead.    CT Head Wo Contrast [AH]  1621 Creatinine elevated at 1.48. Will provide IVF.   Creatinine(!): 1.48 [AH]    Clinical Course User Index [AH] Arville Lime, PA-C   Patient presents with bilateral leg weakness, worse on left leg. Initial concern for CVA. CT head is negative for an acute intracranial pathology. Patient has an ICD and cannot get MRI. Discussed case with neurology. Neurology has evaluated patient. Neurology does not recommend a stroke work up at this time. Neurology recommends PT due to weakness. Consulted hospitalist and hospitalist has agreed to admit patient.   Final Clinical Impressions(s) / ED  Diagnoses   Final diagnoses:  Weakness    ED Discharge Orders    None       Arville Lime, [place] 07/12/18 1814    Julianne Rice, MD 07/14/18 [phone]

## 2018-07-12 NOTE — ED Notes (Signed)
Pt from home alone, states he fell last Thursday and again last PM attempting to get into vehicle.  Pt family member states pt has visual hallucinations that have been present for 2 weeks or more.  Pt denies LOC.  States he lost balance.  Pt is on blood thinners and also has a pacemaker present on assessment.   Pt states his left leg is numb along with increased weakness in bilateral lower extremities.  Pt uses cane at home.    Pt denies any current pain.

## 2018-07-13 DIAGNOSIS — R2681 Unsteadiness on feet: Secondary | ICD-10-CM

## 2018-07-13 DIAGNOSIS — W19XXXA Unspecified fall, initial encounter: Secondary | ICD-10-CM

## 2018-07-13 DIAGNOSIS — I10 Essential (primary) hypertension: Secondary | ICD-10-CM

## 2018-07-13 DIAGNOSIS — R296 Repeated falls: Secondary | ICD-10-CM

## 2018-07-13 DIAGNOSIS — M1712 Unilateral primary osteoarthritis, left knee: Secondary | ICD-10-CM

## 2018-07-13 MED ORDER — ACETAMINOPHEN 325 MG PO TABS
650.0000 mg | ORAL_TABLET | Freq: Three times a day (TID) | ORAL | Status: DC
  Administered 2018-07-13 – 2018-07-15 (×6): 650 mg via ORAL
  Filled 2018-07-13 (×6): qty 2

## 2018-07-13 MED ORDER — LIDOCAINE 5 % EX PTCH
1.0000 | MEDICATED_PATCH | CUTANEOUS | Status: DC
  Administered 2018-07-13 – 2018-07-15 (×3): 1 via TRANSDERMAL
  Filled 2018-07-13 (×3): qty 1

## 2018-07-13 NOTE — Progress Notes (Signed)
CSW consulted with patient regarding SNF however patient very hard of hearing and suggested CSW call family. CSW called family members in [place] and left voicemails.   CSW will continue to follow up.   [place], [place]

## 2018-07-13 NOTE — Clinical Social Work Note (Signed)
Clinical Social Work Assessment  Patient Details  Name: Cody Faulkner MRN: [id number] Date of Birth: 31-Mar-1946  Date of referral:  07/13/18               Reason for consult:  Discharge Planning                Permission sought to share information with:  Case Manager, Facility Sport and exercise psychologist, Family Supports Permission granted to share information::  Yes, Verbal Permission Granted  Name::     Freight forwarder::  SNFs  Relationship::  son  Contact Information:  [phone]  Housing/Transportation Living arrangements for the past 2 months:  [place] of Information:  Patient Patient Interpreter Needed:  None Criminal Activity/Legal Involvement Pertinent to Current Situation/Hospitalization:  No - Comment as needed Significant Relationships:  Adult Children Lives with:  Self Do you feel safe going back to the place where you live?  No Need for family participation in patient care:  Yes (Comment)  Care giving concerns:  CSW received referral for possible SNF placement at time of discharge. Spoke with patient regarding possibility of SNF placement . Patient's   Family  is currently unable to care for him at their home given patient's current needs and fall risk.  Patient and son [name]   expressed understanding of PT recommendation and are agreeable to SNF placement at time of discharge. CSW to continue to follow and assist with discharge planning needs.     Social Worker assessment / plan:  Spoke with patient and  Son [name]   concerning possibility of rehab at [facility] before returning home.    Employment status:  Retired Nurse, adult PT Recommendations:  [place] / Referral to community resources:  [place]  Patient/Family's Response to care:  Patient and son [name]  recognize need for rehab before returning home and are agreeable to a SNF. They report preference for     [facility]  . CSW  explained insurance authorization process. Patient's family reported that they want patient to get stronger to be able to come back home.    Patient/Family's Understanding of and Emotional Response to Diagnosis, Current Treatment, and Prognosis:  Patient/family is realistic regarding therapy needs and expressed being hopeful for SNF placement. Patient expressed understanding of CSW role and discharge process as well as medical condition. No questions/concerns about plan or treatment.   Emotional Assessment Appearance:  Appears stated age Attitude/Demeanor/Rapport:  Self-Confident Affect (typically observed):  Accepting Orientation:  Oriented to Self, Oriented to Place, Oriented to  Time, Oriented to Situation Alcohol / Substance use:  Not Applicable Psych involvement (Current and /or in the community):  No (Comment)  Discharge Needs  Concerns to be addressed:  Discharge Planning Concerns Readmission within the last 30 days:  No Current discharge risk:  Dependent with Mobility Barriers to Discharge:  Continued Medical Work up   [employer], LCSW 07/13/2018, 3:04 PM

## 2018-07-13 NOTE — Progress Notes (Signed)
   Subjective:  Cody Faulkner is a 73 y.o. with PMH of PAF, HFpEF, CKD3a, OSA and HLD admit for recurrent falls on hospital day 1  Cody Faulkner was examined and evaluated at bedside this AM. He states he feels better but is continuing to endorse significant knee pain. He states tylenol has been helping but is unsure if voltaren gel has helped at all. He mentions some topical ointment that has helped in the past at a different facility. Denies any F/N/V/D/C.  Objective:  Vital signs in last 24 hours: Vitals:   07/12/18 1900 07/12/18 2026 07/13/18 0456 07/13/18 0834  BP: (!) 161/72 (!) 183/72 (!) 172/79   Pulse: 63 65 64   Resp: 18 (!) 23    Temp:  98.5 F (36.9 C) 98.8 F (37.1 C)   TempSrc:  Oral Oral   SpO2: 93% 96% 96% 94%    Gen: Well-developed, well nourished, NAD HEENT: Hematoma over left eyebrow with ecchymosis over entire left side of face, PERRL Neck: supple, ROM intact CV: RRR, S1, S2 normal, No rubs, no murmurs, no gallops Pulm: CTAB, No rales, no wheezes Extm: ROM limited by pain, Significant swollen bilateral knees without erythema with arthritic changes and crepitus on movement, Peripheral pulses intact, No peripheral edema Skin: Dry, Warm, normal turgor, hyperpigmentation on lower extremities bilaterally Psych: Normal mood and affect  Assessment/Plan:  Active Problems:   Weakness  Cody Faulkner is a 73yo M w/ PMH of Paroxysmal A.fib on Eliquis, HFpEF s/p ICD, CKD3a, OSA, DJD and HLD admit after fall. Since admission his neuro exams have been benign. He is continuing to endorse significant joint pains which is expected considering recent trauma with soft tissue injury. PT recommend SNF and he will stay inpatient while awaiting placement.  Mechanical fall 2/2 deconditioning and degenerative joint disease Knee X-ray show severe degenerative joint disease and mild joint effusion but no fracture or dislocation. Has some ongoing knee contracture interfering with ambulation and  mobility. Will need to follow up with ortho for evaluation but can be done as outpatient. Pain somewhat controlled but requesting support. - Appreciate neurology recs: no further neurological testing - Appreciate PT recs: discharge of SNF - Tylenol, Voltaren gel for pain - Will add on lidocaine patch in areas of pain - F/u with ortho as outpatient  HTN Current in bp 172/79. Currently in acute pain. - C/w home meds: hydralazine 25mg  BID, losartan 100mg  daily  Chronic diastolic heart failure Last echo from December 2019 showed LV EF 70%, grade 1 diastolic dysfunction. Currently on oral furosemide for lower extremity edema. - C/w home meds: furosemide 80mg  daily, hydralazine 25mg  BID, imdur 60mg  daily  DVT prophx: Eliquis Diet: Cardiac Bowel: Senokot Code: Full  Dispo: Anticipated discharge in approximately 1-2 day(s) based on SNF placement.   [name], MD 07/13/2018, 11:25 AM Pager: [phone]

## 2018-07-13 NOTE — Evaluation (Signed)
Physical Therapy Evaluation Patient Details Name: Cody Faulkner MRN: [id number] DOB: January 08, 1946 Today's Date: 07/13/2018   History of Present Illness  Cody Faulkner is a 73yo M w/ PMH of Paroxysmal A.fib on Eliquis, HFpEF s/p ICD, CKD3a, OSA, DJD and HLD presenting with fall. He has increasing falls recently and his x ray revealed DJD L knee, and CT head showed large superficial hematoma of his left forehead but no intracranial mass or hemorrhage.   Clinical Impression   Pt received in bed, willing to work with therapy. Pt hears and sees better on R side. Noted decreased sensation LE bilaterally. L LE grossly weaker than R LE. L knee contracture, very painful to touch. Bed mobility with no physical assist. Pt somewhat impulsive and needed frequent cuing to wait for PT before moving. During transfers pt does not WB through L LE. Able to stand for about 30 seconds using RW on R LE before needing to sit back down. Did not attempt ambulation due to pt's unsteadiness and fatigue in standing.  Noted that L knee is hypersensitive and warm to touch, very painful and pt will not WB- messaged Dr. about possible gout flare. Overall pt is requiring mod-max assistance for transfers, so recommending SNF for DC.   Also discussed with pt long term care options for after SNF due to frequency of falls and to maximize safety long term.    Follow Up Recommendations SNF;Supervision/Assistance - 24 hour    Equipment Recommendations  Other (comment)(defer to next venue; pt has RW and cane)    Recommendations for Other Services       Precautions / Restrictions Precautions Precautions: Fall Restrictions Weight Bearing Restrictions: No      Mobility  Bed Mobility Overal bed mobility: Needs Assistance Bed Mobility: Supine to Sit     Supine to sit: Supervision     General bed mobility comments: Pt able to move from supine to sit EOB without physical assist, use of bed rail, S for safety and v/c for scoot  forward  Transfers Overall transfer level: Needs assistance Equipment used: Rolling walker (2 wheeled) Transfers: Sit to/from [facility] Sit to Stand: Mod assist Stand pivot transfers: Max assist       General transfer comment: Sit to stand: pt able to boost from seat with minimal physical assist but required modA to achieve upright position in top half of stand.  Pt able to put head forward and WB through R LE to stand. Stand pivot: pt does not tolerate blocking of weaker L knee due to pain. Does not WB through L LE so max assist for pivot. Suspect pt may do better with sit to stand with RW then pivot.  Ambulation/Gait                Stairs            Wheelchair Mobility    Modified Rankin (Stroke Patients Only)       Balance Overall balance assessment: Needs assistance;History of Falls Sitting-balance support: Feet supported;Single extremity supported Sitting balance-[name]: Good Sitting balance - Comments: Pt able to lean to each side to remove gown from under him and re-right himself with min guard for safety. Able to maintain sitting without support   Standing balance support: Bilateral upper extremity supported Standing balance-[name]: Poor Standing balance comment: Pt requires UE support using RW to maintain standing balance, as he does not WB thru L LE. Unsteady in standing even with UE support.  Pertinent Vitals/Pain Pain Assessment: Faces Faces Pain Scale: Hurts whole lot Pain Location: L knee Pain Descriptors / Indicators: Constant;Discomfort;Sore;Tender Pain Intervention(s): Limited activity within patient's tolerance;Monitored during session;Repositioned    Home Living Family/patient expects to be discharged to:: Private residence Living Arrangements: Alone Available Help at Discharge: Family;Available PRN/intermittently Type of Home: Mobile home Home Access: Ramped entrance      Home Layout: One level Home Equipment: [place] - 2 wheels;Cane - single point      Prior Function Level of Independence: Independent with assistive device(s)         Comments: uses a SPC in community and RW in home for mobility     Hand Dominance   Dominant Hand: Right    Extremity/Trunk Assessment   Upper Extremity Assessment Upper Extremity Assessment: Defer to OT evaluation    Lower Extremity Assessment Lower Extremity Assessment: LLE deficits/detail;RLE deficits/detail RLE Sensation: decreased light touch LLE Deficits / Details: L LE grossly weaker than R LE; hip flex 3+/5, knee ext 3/5, PF 4-/5, DF 4-/5 LLE Sensation: decreased light touch    Cervical / Trunk Assessment Cervical / Trunk Assessment: Kyphotic  Communication   Communication: No difficulties  Cognition Arousal/Alertness: Awake/alert Behavior During Therapy: Impulsive Overall Cognitive Status: Within Functional Limits for tasks assessed                                 General Comments: Pt impulsive, required frequent v/c to wait for PT before moving.      General Comments      Exercises     Assessment/Plan    PT Assessment Patient needs continued PT services  PT Problem List Decreased strength;Decreased balance;Pain;Decreased range of motion;Decreased mobility;Decreased knowledge of use of DME;Decreased activity tolerance;Decreased coordination;Decreased safety awareness       PT Treatment Interventions DME instruction;Functional mobility training;Balance training;Gait training;Therapeutic activities;Stair training;Therapeutic exercise    PT Goals (Current goals can be found in the Care Plan section)  Acute Rehab PT Goals Patient Stated Goal: go to rehab to get stronger PT Goal Formulation: With patient Time For Goal Achievement: 07/27/18 Potential to Achieve Goals: Good    Frequency Min 2X/week   Barriers to discharge Other (comment) Pt's functional mobility status  indicates that he would be unsafe to DC home- requiring mod-max assist for transfers, unable to ambulate    Co-evaluation               AM-PAC PT "6 Clicks" Mobility  Outcome Measure Help needed turning from your back to your side while in a flat bed without using bedrails?: A Little Help needed moving from lying on your back to sitting on the side of a flat bed without using bedrails?: A Little Help needed moving to and from a bed to a chair (including a wheelchair)?: A Lot Help needed standing up from a chair using your arms (e.g., wheelchair or bedside chair)?: A Lot Help needed to walk in hospital room?: Total Help needed climbing 3-5 steps with a railing? : Total 6 Click Score: 12    End of Session Equipment Utilized During Treatment: Gait belt Activity Tolerance: Patient limited by pain Patient left: in chair;with chair alarm set;with call bell/phone within reach Nurse Communication: Mobility status PT Visit Diagnosis: Unsteadiness on feet (R26.81);Repeated falls (R29.6);Muscle weakness (generalized) (M62.81);Pain Pain - Right/Left: Left Pain - part of body: Knee    Time: 0905-1000(extra time for student problem-solving) PT Time Calculation (min) (  ACUTE ONLY): 55 min   Charges:   PT Evaluation $PT Eval Moderate Complexity: 1 Mod PT Treatments $Self Care/Home Management: [place], SPT   Ronnell [place] 07/13/2018, 10:58 AM

## 2018-07-13 NOTE — Discharge Instructions (Addendum)
Cody Faulkner  You came to [place] with repeat falls. It appears your falls are caused by really bad arthritis of your legs. Here are our recommendations for you at discharge:  - Please continue taking Tylenol and Voltaren gel for pain control - Please follow up with your orthopedic doctor - Please take all of your other medications as prescribed  Thank you for choosing [facility] on my medicine - ELIQUIS (apixaban)  Why was Eliquis prescribed for you? Eliquis was prescribed for you to reduce the risk of a blood clot forming that can cause a stroke if you have a medical condition called atrial fibrillation (a type of irregular heartbeat).  What do You need to know about Eliquis ? Take your Eliquis TWICE DAILY - one tablet in the morning and one tablet in the evening with or without food. If you have difficulty swallowing the tablet whole please discuss with your pharmacist how to take the medication safely.  Take Eliquis exactly as prescribed by your doctor and DO NOT stop taking Eliquis without talking to the doctor who prescribed the medication.  Stopping may increase your risk of developing a stroke.  Refill your prescription before you run out.  After discharge, you should have regular check-up appointments with your healthcare provider that is prescribing your Eliquis.  In the future your dose may need to be changed if your kidney function or weight changes by a significant amount or as you get older.  What do you do if you miss a dose? If you miss a dose, take it as soon as you remember on the same day and resume taking twice daily.  Do not take more than one dose of ELIQUIS at the same time to make up a missed dose.  Important Safety Information A possible side effect of Eliquis is bleeding. You should call your healthcare provider right away if you experience any of the following: ? Bleeding from an injury or your nose that does not stop. ? Unusual colored urine (red  or dark brown) or unusual colored stools (red or black). ? Unusual bruising for unknown reasons. ? A serious fall or if you hit your head (even if there is no bleeding).  Some medicines may interact with Eliquis and might increase your risk of bleeding or clotting while on Eliquis. To help avoid this, consult your healthcare provider or pharmacist prior to using any new prescription or non-prescription medications, including herbals, vitamins, non-steroidal anti-inflammatory drugs (NSAIDs) and supplements.  This website has more information on Eliquis (apixaban): http://www.eliquis.com/eliquis/home

## 2018-07-13 NOTE — Evaluation (Signed)
Occupational Therapy Evaluation Patient Details Name: Cody Faulkner MRN: [id number] DOB: 1945-08-16 Today's Date: 07/13/2018    History of Present Illness Cody Faulkner is a 73yo M w/ PMH of Paroxysmal A.fib on Eliquis, HFpEF s/p ICD, CKD3a, OSA, DJD and HLD presenting with fall. He has increasing falls recently and his x ray revealed DJD L knee, and CT head showed large superficial hematoma of his left forehead but no intracranial mass or hemorrhage.   Clinical Impression   PTA Pt living at home alone, mod I with ADL and transfers - but experiencing increasing falls. Today Pt was mod A +2 for transfers, requires sitting for grooming tasks, max A for LB ADL. Unable to assess vision this session due to swelling. Pt will benefit from skilled OT in the acute setting as well as afterwards at the SNF level to maximize safety and independence in ADL and functional transfers.     Follow Up Recommendations  SNF;Supervision/Assistance - 24 hour    Equipment Recommendations  Other (comment)(defer to next venue of care)    Recommendations for Other Services       Precautions / Restrictions Precautions Precautions: Fall Restrictions Weight Bearing Restrictions: No      Mobility Bed Mobility Overal bed mobility: Needs Assistance Bed Mobility: Supine to Sit     Supine to sit: Supervision     General bed mobility comments: use of bed rails  Transfers Overall transfer level: Needs assistance Equipment used: Rolling walker (2 wheeled) Transfers: Sit to/from [facility] Sit to Stand: Mod assist;+2 physical assistance Stand pivot transfers: Max assist;+2 safety/equipment       General transfer comment: Pt mod A for boost, and balance upon standing, then utilizing RW for pivot to recliner with max A for RW management and support    Balance Overall balance assessment: Needs assistance;History of Falls Sitting-balance support: Feet supported;Single extremity  supported Sitting balance-[name]: Good Sitting balance - Comments: Pt able to lean to each side to remove gown from under him and re-right himself with min guard for safety. Able to maintain sitting without support   Standing balance support: Bilateral upper extremity supported Standing balance-[name]: Poor Standing balance comment: dependent on RW and external support                           ADL either performed or assessed with clinical judgement   ADL Overall ADL's : Needs assistance/impaired Eating/Feeding: Independent;Sitting   Grooming: Set up;Sitting   Upper Body Bathing: Set up;Sitting   Lower Body Bathing: Maximal assistance   Upper Body Dressing : Set up;Sitting   Lower Body Dressing: Maximal assistance;+2 for physical assistance   Toilet Transfer: Moderate assistance;+2 for safety/equipment   Toileting- Clothing Manipulation and Hygiene: Maximal assistance;+2 for physical assistance;+2 for safety/equipment       Functional mobility during ADLs: Moderate assistance;+2 for safety/equipment;+2 for physical assistance(SPT only) General ADL Comments: Pt with decreased access to LB for ADL, pain, decreased safety awareness     Vision Baseline Vision/History: Wears glasses Wears Glasses: At all times Vision Assessment?: Vision impaired- to be further tested in functional context Additional Comments: L eye remains swollen and so unable to fully assess     Perception     Praxis      Pertinent Vitals/Pain Pain Assessment: Faces Faces Pain Scale: Hurts whole lot Pain Location: L knee Pain Descriptors / Indicators: Constant;Discomfort;Sore;Tender Pain Intervention(s): Limited activity within patient's tolerance;Monitored during session;Repositioned(delined ice)  Hand Dominance Right   Extremity/Trunk Assessment     Lower Extremity Assessment Lower Extremity Assessment: Defer to PT evaluation   Cervical / Trunk Assessment Cervical /  Trunk Assessment: Kyphotic   Communication Communication Communication: No difficulties   Cognition Arousal/Alertness: Awake/alert Behavior During Therapy: Impulsive Overall Cognitive Status: Within Functional Limits for tasks assessed                                 General Comments: impulsive vs eager to please, requires cues to slow down   General Comments       Exercises     Shoulder Instructions      Home Living Family/patient expects to be discharged to:: Private residence Living Arrangements: Alone Available Help at Discharge: Family;Available PRN/intermittently Type of Home: Mobile home Home Access: Ramped entrance     Home Layout: One level     Bathroom Shower/Tub: Teacher, early years/pre: Standard Bathroom Accessibility: No   Home Equipment: Environmental consultant - 2 wheels;Cane - single point   Additional Comments[facility] the [facility]      Prior Functioning/Environment Level of Independence: Independent with assistive device(s)        Comments: uses a SPC in community and RW in home for mobility        OT Problem List: Decreased strength;Decreased range of motion;Impaired balance (sitting and/or standing);Decreased activity tolerance;Decreased safety awareness;Obesity;Pain      OT Treatment/Interventions: Self-care/ADL training;DME and/or AE instruction;Therapeutic activities;Patient/family education;Balance training    OT Goals(Current goals can be found in the care plan section) Acute Rehab OT Goals Patient Stated Goal: go to rehab to get stronger OT Goal Formulation: With patient Time For Goal Achievement: 07/27/18 Potential to Achieve Goals: Good ADL Goals Pt Will Perform Grooming: with modified independence;sitting Pt Will Perform Lower Body Bathing: sitting/lateral leans;with set-up;with adaptive equipment Pt Will Perform Lower Body Dressing: with mod assist;sit to/from stand Pt Will Transfer to Toilet: with  mod assist;stand pivot transfer;bedside commode Pt Will Perform Toileting - Clothing Manipulation and hygiene: with mod assist;sit to/from stand Additional ADL Goal #1: Pt will perform bed mobility at mod I level prior to engaging in ADL  OT Frequency: Min 2X/week   Barriers to D/C: Decreased caregiver support  Pt lives alone, increasing falls       Co-evaluation              AM-PAC OT "6 Clicks" Daily Activity     Outcome Measure Help from another person eating meals?: A Little Help from another person taking care of personal grooming?: A Little Help from another person toileting, which includes using toliet, bedpan, or urinal?: A Lot Help from another person bathing (including washing, rinsing, drying)?: A Lot Help from another person to put on and taking off regular upper body clothing?: A Little Help from another person to put on and taking off regular lower body clothing?: A Lot 6 Click Score: 15   End of Session Equipment Utilized During Treatment: Gait belt;Rolling walker Nurse Communication: Mobility status;Patient requests pain meds  Activity Tolerance: Patient limited by pain(LLE) Patient left: in chair;with call bell/phone within reach;with chair alarm set  OT Visit Diagnosis: Unsteadiness on feet (R26.81);Other abnormalities of gait and mobility (R26.89);Repeated falls (R29.6);History of falling (Z91.81);Muscle weakness (generalized) (M62.81);Pain Pain - Right/Left: Left Pain - part of body: Knee  Time: 3235-5732 OT Time Calculation (min): 24 min Charges:  OT General Charges $OT Visit: 1 Visit OT Evaluation $OT Eval Moderate Complexity: 1 Mod OT Treatments $Self Care/Home Management : 8-22 mins  Hulda Humphrey OTR/L Acute Rehabilitation Services Pager: [phone] Office: [place] 07/13/2018, 6:12 PM

## 2018-07-13 NOTE — Progress Notes (Signed)
Medicine attending: I examined this patient today together with resident physician Dr. Gilberto Better and I concur with his evaluation and management plan which will be subsequently recorded in his daily progress note. 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  Please see separate attending admission note for full details. He remained stable overnight.  He is awake, alert, oriented.  Persistent large periorbital and left frontal hematoma with blood tracking down the left side of his face to his neck.  No focal neurologic deficits.  Initial CT brain negative for fracture or intracranial pathology. Physical therapy recommending skilled nursing facility. Social worker contacted patient's son.  However, on my entrance exam, patient stated he was single, not married, and had no children.  I wonder if his "son" is actually another relative?  This person is agreeable to skilled nursing facility placement. He would benefit by orthopedic surgery consultation as an outpatient to see whether or not he is a acceptable candidate for joint replacement surgery.  Ability to administer nonsteroidal analgesics is prohibited by his chronic anticoagulation, the fact that he has a solitary kidney, and the function of his remaining kidney is already compromised with creatinine 1.5.  We are palliating with topical Voltaren gel and oral Tylenol.

## 2018-07-13 NOTE — Telephone Encounter (Signed)
Patient sees Dr. Shirlee More in [place]

## 2018-07-13 NOTE — Plan of Care (Signed)
  Problem: Pain Managment: Goal: General experience of comfort will improve Outcome: Progressing   Problem: Safety: Goal: Ability to remain free from injury will improve Outcome: Progressing   

## 2018-07-13 NOTE — NC FL2 (Signed)
[place] LEVEL OF CARE SCREENING TOOL     IDENTIFICATION  Patient Name: Cody Faulkner Birthdate: [birth year] Sex: male Admission Date (Current Location): 07/12/2018  [facility] and [place] Number:  Herbalist and Address:  [facility], [place] [address], [place], [place] [address]      Provider Number: [id number]  Attending Physician Name and Address:  Annia Belt, MD  Relative Name and Phone Number:  Vardaan Depascale [phone]    Current Level of Care: SNF Recommended Level of Care: [place] Prior Approval Number:    Date Approved/Denied:   PASRR Number: [id number]  Discharge Plan: SNF    Current Diagnoses: Patient Active Problem List   Diagnosis Date Noted  . Unsteady gait   . Primary osteoarthritis of left knee   . HTN (hypertension), benign   . Weakness 07/12/2018  . Delirium 05/30/2018  . On amiodarone therapy 05/18/2018  . Chronic anticoagulation- Coumadin 12/23/2013  . Sinus node dysfunction ([place]) 12/21/2013  . Paroxysmal ventricular tachycardia ([place]) 12/21/2013  . ICD (implantable cardioverter-defibrillator) lead failure 12/20/2013  . Obesity hypoventilation syndrome ([place]) 12/07/2012  . History of hyperthyroidism   . CKD (chronic kidney disease) stage 3, GFR 30-59 ml/min (HCC) 06/23/2011  . Morbid obesity   . Long term current use of anticoagulant therapy   . OSA (obstructive sleep apnea)   . Hypertensive heart disease with heart failure ([place])   . Solitary kidney   . Community acquired pneumonia 06/06/2011  . Acute encephalopathy 06/06/2011  . Hypothyroidism   . Mild persistent asthma   . PAF (paroxysmal atrial fibrillation) ([place])   . ICD (implantable cardioverter-defibrillator) in place   . History of dilated cardiomyopathy- last EF 50-55% 2D July 2014   . Chronic combined systolic and diastolic heart failure ([place])   . CAD nonobstructive- 2007 11/12/2005    Orientation RESPIRATION  BLADDER Height & Weight     Self, Time, Situation, Place  Normal Continent Weight:   Height:     BEHAVIORAL SYMPTOMS/MOOD NEUROLOGICAL BOWEL NUTRITION STATUS      Continent Diet(please see discharge summary)  AMBULATORY STATUS COMMUNICATION OF NEEDS Skin   Extensive Assist Verbally Normal                       Personal Care Assistance Level of Assistance  Bathing, Feeding, Dressing Bathing Assistance: Limited assistance Feeding assistance: Independent Dressing Assistance: Limited assistance Total Care Assistance: Maximum assistance   Functional Limitations Info  Sight, Hearing, Speech Sight Info: Adequate Hearing Info: Adequate Speech Info: Adequate    SPECIAL CARE FACTORS FREQUENCY  PT (By licensed PT), OT (By licensed OT)     PT Frequency: 3x per week  OT Frequency: 3x per week            Contractures Contractures Info: Not present    Additional Factors Info  Code Status, Allergies Code Status Info: FULL Allergies Info: Methimazole           Current Medications (07/13/2018):  This is the current hospital active medication list Current Facility-Administered Medications  Medication Dose Route Frequency Provider Last Rate Last Dose  . 0.9 %  sodium chloride infusion  250 mL Intravenous PRN [name], MD      . acetaminophen (TYLENOL) tablet 650 mg  650 mg Oral Q6H PRN [name], MD   650 mg at 07/13/18 0008   Or  . acetaminophen (TYLENOL) suppository 650 mg  650 mg  Rectal Q6H PRN [name], MD      . albuterol (PROVENTIL) (2.5 MG/3ML) 0.083% nebulizer solution 2.5 mg  2.5 mg Inhalation Q4H PRN [name], MD      . allopurinol (ZYLOPRIM) tablet 100 mg  100 mg Oral Daily [name], MD   100 mg at 07/13/18 1128  . amiodarone (PACERONE) tablet 200 mg  200 mg Oral Daily [name], MD   200 mg at 07/13/18 1129  . apixaban (ELIQUIS) tablet 5 mg  5 mg Oral BID [name], MD   5 mg at 07/13/18 1126  . clonazePAM  (KLONOPIN) disintegrating tablet 0.25 mg  0.25 mg Oral QHS [name], MD   0.25 mg at 07/12/18 2151  . diclofenac sodium (VOLTAREN) 1 % transdermal gel 2 g  2 g Topical QID [name], MD   2 g at 07/13/18 1000  . fenofibrate tablet 160 mg  160 mg Oral Daily [name], MD   160 mg at 07/13/18 1127  . fluticasone (FLONASE) 50 MCG/ACT nasal spray 2 spray  2 spray Each Nare Daily PRN [name], MD      . furosemide (LASIX) tablet 80 mg  80 mg Oral Daily [name], MD   80 mg at 07/13/18 1126  . hydrALAZINE (APRESOLINE) tablet 25 mg  25 mg Oral BID [name], MD   25 mg at 07/13/18 0900  . isosorbide mononitrate (IMDUR) 24 hr tablet 60 mg  60 mg Oral Daily [name], MD   60 mg at 07/13/18 1000  . levothyroxine (SYNTHROID, LEVOTHROID) tablet 125 mcg  125 mcg Oral QAC breakfast [name], MD   125 mcg at 07/13/18 0502  . lidocaine (LIDODERM) 5 % 1 patch  1 patch Transdermal Q24H [name], MD   1 patch at 07/13/18 1129  . loratadine (CLARITIN) tablet 10 mg  10 mg Oral q morning - 10a [name], MD   10 mg at 07/13/18 1127  . losartan (COZAAR) tablet 100 mg  100 mg Oral Daily [name], MD   100 mg at 07/13/18 1128  . mometasone-formoterol (DULERA) 200-5 MCG/ACT inhaler 2 puff  2 puff Inhalation BID [name], MD   2 puff at 07/13/18 [phone]  . potassium chloride SA (K-DUR,KLOR-CON) CR tablet 20 mEq  20 mEq Oral Inda Merlin, MD   20 mEq at 07/13/18 1126  . senna-docusate (Senokot-S) tablet 1 tablet  1 tablet Oral QHS PRN [name], MD      . sodium chloride flush (NS) 0.9 % injection 3 mL  3 mL Intravenous Q12H [name], MD   3 mL at 07/13/18 1135  . sodium chloride flush (NS) 0.9 % injection 3 mL  3 mL Intravenous Q12H [name], MD   3 mL at 07/13/18 1135  . sodium chloride flush (NS) 0.9 % injection 3 mL  3 mL Intravenous PRN [name], MD         Discharge  Medications: Please see discharge summary for a list of discharge medications.  Relevant Imaging Results:  Relevant Lab Results:   Additional Information SSN [id number]  Vinie Sill, LCSWA

## 2018-07-14 NOTE — Discharge Summary (Addendum)
Name: Cody Faulkner MRN: [id number] DOB: 29-Apr-1946 73 y.o. PCP: Cyndi Bender, PA-C  Date of Admission: 07/12/2018  9:22 AM Date of Discharge: 07/15/2018 Attending Physician: Annia Belt, MD  Discharge Diagnosis: 1. Recurrent falls due to degenerative joint disease  Discharge Medications: Allergies as of 07/15/2018      Reactions   Methimazole [methimazole] Itching, Rash   Severe rash      Medication List    STOP taking these medications   albuterol 108 (90 Base) MCG/ACT inhaler Commonly known as:  PROVENTIL HFA;VENTOLIN HFA Replaced by:  albuterol (2.5 MG/3ML) 0.083% nebulizer solution     TAKE these medications   acetaminophen 500 MG tablet Commonly known as:  TYLENOL Take 500 mg by mouth every 6 (six) hours as needed for moderate pain.   albuterol (2.5 MG/3ML) 0.083% nebulizer solution Commonly known as:  PROVENTIL Inhale 3 mLs (2.5 mg total) into the lungs every 4 (four) hours as needed for shortness of breath. Replaces:  albuterol 108 (90 Base) MCG/ACT inhaler   allopurinol 100 MG tablet Commonly known as:  ZYLOPRIM Take 100 mg by mouth daily.   amiodarone 200 MG tablet Commonly known as:  PACERONE Take 1 tablet (200 mg total) by mouth daily.   budesonide-formoterol 160-4.5 MCG/ACT inhaler Commonly known as:  SYMBICORT Inhale 2 puffs into the lungs 2 (two) times daily.   clonazePAM 0.5 MG tablet Commonly known as:  KLONOPIN Take 0.25-0.5 mg by mouth See admin instructions. Take 1 tablet by mouth at bedtime for sleep and 1/2 tablet in the morning for anxiety   diclofenac sodium 1 % Gel Commonly known as:  VOLTAREN Apply 2 g topically 4 (four) times daily.   ELIQUIS 5 MG Tabs tablet Generic drug:  apixaban TAKE 1 TABLET BY MOUTH TWICE A DAY What changed:  how much to take   fenofibrate 160 MG tablet Take 160 mg by mouth daily.   furosemide 80 MG tablet Commonly known as:  LASIX Take 1 tablet (80 mg total) by mouth daily.   hydrALAZINE 25  MG tablet Commonly known as:  APRESOLINE Take 1 tablet (25 mg total) by mouth 2 (two) times daily.   isosorbide mononitrate 60 MG 24 hr tablet Commonly known as:  IMDUR Take 1 tablet (60 mg total) by mouth daily.   loratadine 10 MG tablet Commonly known as:  CLARITIN Take 10 mg by mouth every morning.   losartan 100 MG tablet Commonly known as:  COZAAR Take 100 mg by mouth daily.   potassium chloride SA 20 MEQ tablet Commonly known as:  K-DUR,KLOR-CON Take 1 tablet (20 mEq total) by mouth every other day.   SYNTHROID 125 MCG tablet Generic drug:  levothyroxine Take 125 mcg by mouth daily.   triamcinolone cream 0.1 % Commonly known as:  KENALOG Apply 1 application topically as needed (on affected area(s)on skin).   Vitamin D 50 MCG (2000 UT) tablet Take 2,000 Units by mouth daily.       Disposition and follow-up:   Mr.Cody Faulkner was discharged from [facility] in Stable condition.  At the hospital follow up visit please address:  1. Recurrent falls due to degenerative joint disease: - Mr.Cody Faulkner has been having multiple mechanical falls - Please have him follow up with orthopedic physician for evaluation for possible knee replacement - Please ensure he continues physical therapy until he is back to baseline - Please assess his hematoma for resolution - Consider repeat CT head if he develops  any focal neurological deficits  2.  Labs / imaging needed at time of follow-up: None  3.  Pending labs/ test needing follow-up: None  Follow-up Appointments:  Contact information for follow-up providers    Cyndi Bender, PA-C. Call.   Specialty:  Physician Assistant Contact information: [place] [address] [phone]            Contact information for after-discharge care    Destination    [facility] Preferred SNF .   Service:  Skilled Nursing Contact information: [place] [facility] Course by problem list: 1. Recurrent falls due to degenerative joint disease: Mr.Cody Faulkner is a 73 yo M w/ PMH of PAF on Eliquis, Gout, HFpEF, CKD3a, OSA and HLD presenting after a mechanical fall while trying to get into his truck with trauma to the head. CT head was negative was positive for superficial hematoma but no intracranial bleed. No active bleed was noted and his Eliquis was continued. Neurology was consulted for concerns of stroke with lower extremity weakness. He was not a candidate for MRI due to his pacemaker. Neurological exam was found to be normal. He had significant pain and swelling of his knees which revealed no fractures but significant degenerative joint disease. His fall was attributed to ambulatory dysfunction due to severe arthritic changes to his knees. He was evaluated by PT & OT and was deemed appropriate to be discharged to SNF. Discharged with recommendation to follow up with PCP and ortho as outpatient.  Discharge Vitals:   BP 125/62 (BP Location: Right Arm)   Pulse 65   Temp 98.2 F (36.8 C) (Oral)   Resp 16   SpO2 98%   Pertinent Labs, Studies, and Procedures:  CBC Latest Ref Rng & Units 07/12/2018 07/08/2018 05/30/2018  WBC 4.0 - 10.5 K/uL 6.3 6.3 5.1  Hemoglobin 13.0 - 17.0 g/dL 11.3(L) 11.8(L) 11.1(L)  Hematocrit 39.0 - 52.0 % 35.6(L) 37.0(L) 34.8(L)  Platelets 150 - 400 K/uL 223 204 229   LEFT KNEE - 1-2 VIEW IMPRESSION: Severe degenerative joint disease is noted medially. Mild suprapatellar joint effusion is noted. No fracture or dislocation is noted.  CT HEAD WITHOUT CONTRAST IMPRESSION: 1.  No acute intracranial pathology.  2.  Large scalp hematoma of the left forehead.  Discharge Instructions:  Mr.Cody Faulkner  You came to [place] with repeat falls. It appears your falls are caused by really bad arthritis of your legs. Here are our recommendations for you at discharge:  - Please continue  taking Tylenol and Voltaren gel for pain control - Please follow up with your orthopedic doctor - Please take all of your other medications as prescribed   Discharge Instructions    Call MD for:  difficulty breathing, headache or visual disturbances   Complete by:  As directed    Call MD for:  persistant dizziness or light-headedness   Complete by:  As directed    Call MD for:  redness, tenderness, or signs of infection (pain, swelling, redness, odor or green/yellow discharge around incision site)   Complete by:  As directed    Call MD for:  severe uncontrolled pain   Complete by:  As directed    Diet - low sodium heart healthy   Complete by:  As directed    Increase activity slowly   Complete by:  As directed  Thank you for choosing [facility]  Signed: [name], MD 07/15/2018, 12:46 PM   Pager: [phone]

## 2018-07-14 NOTE — Plan of Care (Signed)
  Problem: Pain Managment: Goal: General experience of comfort will improve Outcome: Progressing   Problem: Safety: Goal: Ability to remain free from injury will improve Outcome: Progressing   

## 2018-07-14 NOTE — Progress Notes (Signed)
[employer] insurance auth initiated and pending, CSW lvm with patient son Cody Faulkner regarding first choice of SNF [facility] not being in network with [facility]. Awaiting call back for other SNF choices.   [place], [place]

## 2018-07-14 NOTE — Progress Notes (Signed)
   Subjective:  Cody Faulkner is a 73 y.o. with PMH of PAF, HFpEF, CKD3a, OSA and HLD admit for recurrent falls on hospital day 2  Cody Faulkner was examined and evaluated at bedside this AM. He was observed sleeping comfortably in bed. Upon awakening, he states he feels 'alright.' He mentions that his joint pains has significantly improved especially with elevation and physical therapy. He mentions the topical treatment appear to be working as well. He has no other complaints this AM.  Objective:  Vital signs in last 24 hours: Vitals:   07/13/18 1408 07/13/18 1804 07/13/18 2141 07/14/18 0500  BP: (!) 156/74 122/66 (!) 102/56 110/66  Pulse: 66 72 64 66  Resp: 15 16 20 20   Temp: 98.7 F (37.1 C) 98.4 F (36.9 C) 98.7 F (37.1 C) 98.2 F (36.8 C)  TempSrc: Oral Oral Oral Oral  SpO2: 96% 96% 96% 98%    Gen: Well-developed, well nourished, NAD HEENT: Hematoma over left eyebrow enlarging in size. Also with more widespread ecchymosis over his nose and bilaterally. No nasal discharge, MMM Neck: supple, ROM intact CV: RRR, S1, S2 normal, No rubs, no murmurs, no gallops Pulm: CTAB, No rales, no wheezes Extm: Bilateral swollen knees with arthritic changes and bony protrusions. Warm but not erythematous. Distal pulses intact. No peripheral edema Skin: Dry, Warm, normal turgor, chronic hyperpigmentation over lower shins Neuro: AAOx3 Psych: Normal mood and affect   Assessment/Plan:  Active Problems:   PAF (paroxysmal atrial fibrillation) (HCC)   ICD (implantable cardioverter-defibrillator) in place   OSA (obstructive sleep apnea)   Weakness   Unsteady gait   Primary osteoarthritis of left knee   HTN (hypertension), benign  Cody Faulkner is a 73yo M w/ PMH of Paroxysmal A.fib on Eliquis, HFpEF s/p ICD, CKD3a, OSA,DJDand HLDadmit after fall. His pain is improving and well controlled. He is working with physical therapy, who recommended SNF for discharge. Currently awaiting insurance  authorization.  Mechanical fall 2/2 degenerative joint disease - Appreciate neurology recs: CT scan tomorrow if still admitted. Or can follow up as outpatient - Appreciate PT recs: discharge to SNF - Tylenol, Voltaren gel, lidocaine patch for pain - F/u with ortho as outpatient  HTN Current in bp 138/69 - C/w home meds: hydralazine 25mg  BID, losartan 100mg  daily  Chronic diastolic heart failure Last echo fromDecember 2019showed LV EF 51%, grade 1 diastolic dysfunction. Currently on oral furosemide for lower extremity edema. - C/w home meds: furosemide 80mg  daily, hydralazine 25mg  BID, imdur 60mg  daily  DVT prophx: Eliquis Diet: Cardiac Bowel: Senokot Code: Full  Dispo: Anticipated discharge in approximately 0-1 day(s).   [name], MD 07/14/2018, 6:41 AM Pager: [phone]

## 2018-07-14 NOTE — Progress Notes (Signed)
Medicine attending: I examined this patient today together with resident physician Dr. Gilberto Better and I concur with his evaluation and management plan. Physical therapy and Occupational Therapy consultations appreciated.  Possibility of gouty arthritis raised by physical therapist.  Dose adjusted allopurinol continued.  Left knee pain has subsided with conservative treatment and bedrest. Anticipate discharge to skilled nursing facility today. We confirmed from the patient that [name] is his cousin and not his son.  This is his only family member.

## 2018-07-15 DIAGNOSIS — E538 Deficiency of other specified B group vitamins: Secondary | ICD-10-CM | POA: Diagnosis not present

## 2018-07-15 DIAGNOSIS — F323 Major depressive disorder, single episode, severe with psychotic features: Secondary | ICD-10-CM | POA: Diagnosis not present

## 2018-07-15 DIAGNOSIS — R05 Cough: Secondary | ICD-10-CM | POA: Diagnosis not present

## 2018-07-15 DIAGNOSIS — S0083XD Contusion of other part of head, subsequent encounter: Secondary | ICD-10-CM | POA: Diagnosis not present

## 2018-07-15 DIAGNOSIS — M1990 Unspecified osteoarthritis, unspecified site: Secondary | ICD-10-CM | POA: Diagnosis not present

## 2018-07-15 DIAGNOSIS — R296 Repeated falls: Secondary | ICD-10-CM | POA: Diagnosis not present

## 2018-07-15 DIAGNOSIS — I440 Atrioventricular block, first degree: Secondary | ICD-10-CM | POA: Diagnosis present

## 2018-07-15 DIAGNOSIS — F329 Major depressive disorder, single episode, unspecified: Secondary | ICD-10-CM | POA: Diagnosis not present

## 2018-07-15 DIAGNOSIS — I2510 Atherosclerotic heart disease of native coronary artery without angina pectoris: Secondary | ICD-10-CM | POA: Diagnosis not present

## 2018-07-15 DIAGNOSIS — M170 Bilateral primary osteoarthritis of knee: Secondary | ICD-10-CM | POA: Diagnosis not present

## 2018-07-15 DIAGNOSIS — I420 Dilated cardiomyopathy: Secondary | ICD-10-CM | POA: Diagnosis not present

## 2018-07-15 DIAGNOSIS — G4700 Insomnia, unspecified: Secondary | ICD-10-CM | POA: Diagnosis not present

## 2018-07-15 DIAGNOSIS — H9190 Unspecified hearing loss, unspecified ear: Secondary | ICD-10-CM | POA: Diagnosis not present

## 2018-07-15 DIAGNOSIS — N183 Chronic kidney disease, stage 3 (moderate): Secondary | ICD-10-CM | POA: Diagnosis not present

## 2018-07-15 DIAGNOSIS — E785 Hyperlipidemia, unspecified: Secondary | ICD-10-CM | POA: Diagnosis not present

## 2018-07-15 DIAGNOSIS — R2681 Unsteadiness on feet: Secondary | ICD-10-CM | POA: Diagnosis not present

## 2018-07-15 DIAGNOSIS — R2689 Other abnormalities of gait and mobility: Secondary | ICD-10-CM | POA: Diagnosis not present

## 2018-07-15 DIAGNOSIS — E038 Other specified hypothyroidism: Secondary | ICD-10-CM | POA: Diagnosis not present

## 2018-07-15 DIAGNOSIS — W19XXXA Unspecified fall, initial encounter: Secondary | ICD-10-CM | POA: Diagnosis not present

## 2018-07-15 DIAGNOSIS — I130 Hypertensive heart and chronic kidney disease with heart failure and stage 1 through stage 4 chronic kidney disease, or unspecified chronic kidney disease: Secondary | ICD-10-CM | POA: Diagnosis not present

## 2018-07-15 DIAGNOSIS — S0510XA Contusion of eyeball and orbital tissues, unspecified eye, initial encounter: Secondary | ICD-10-CM | POA: Diagnosis not present

## 2018-07-15 DIAGNOSIS — R338 Other retention of urine: Secondary | ICD-10-CM | POA: Diagnosis not present

## 2018-07-15 DIAGNOSIS — R440 Auditory hallucinations: Secondary | ICD-10-CM | POA: Diagnosis not present

## 2018-07-15 DIAGNOSIS — G4733 Obstructive sleep apnea (adult) (pediatric): Secondary | ICD-10-CM | POA: Diagnosis not present

## 2018-07-15 DIAGNOSIS — E039 Hypothyroidism, unspecified: Secondary | ICD-10-CM | POA: Diagnosis not present

## 2018-07-15 DIAGNOSIS — G8929 Other chronic pain: Secondary | ICD-10-CM | POA: Diagnosis not present

## 2018-07-15 DIAGNOSIS — I1310 Hypertensive heart and chronic kidney disease without heart failure, with stage 1 through stage 4 chronic kidney disease, or unspecified chronic kidney disease: Secondary | ICD-10-CM | POA: Diagnosis not present

## 2018-07-15 DIAGNOSIS — R001 Bradycardia, unspecified: Secondary | ICD-10-CM | POA: Diagnosis not present

## 2018-07-15 DIAGNOSIS — R339 Retention of urine, unspecified: Secondary | ICD-10-CM | POA: Diagnosis not present

## 2018-07-15 DIAGNOSIS — M1712 Unilateral primary osteoarthritis, left knee: Secondary | ICD-10-CM | POA: Diagnosis not present

## 2018-07-15 DIAGNOSIS — N401 Enlarged prostate with lower urinary tract symptoms: Secondary | ICD-10-CM | POA: Diagnosis not present

## 2018-07-15 DIAGNOSIS — R404 Transient alteration of awareness: Secondary | ICD-10-CM | POA: Diagnosis not present

## 2018-07-15 DIAGNOSIS — I878 Other specified disorders of veins: Secondary | ICD-10-CM | POA: Diagnosis not present

## 2018-07-15 DIAGNOSIS — I5032 Chronic diastolic (congestive) heart failure: Secondary | ICD-10-CM | POA: Diagnosis not present

## 2018-07-15 DIAGNOSIS — W19XXXD Unspecified fall, subsequent encounter: Secondary | ICD-10-CM | POA: Diagnosis not present

## 2018-07-15 DIAGNOSIS — J449 Chronic obstructive pulmonary disease, unspecified: Secondary | ICD-10-CM | POA: Diagnosis present

## 2018-07-15 DIAGNOSIS — I129 Hypertensive chronic kidney disease with stage 1 through stage 4 chronic kidney disease, or unspecified chronic kidney disease: Secondary | ICD-10-CM | POA: Diagnosis not present

## 2018-07-15 DIAGNOSIS — S0012XA Contusion of left eyelid and periocular area, initial encounter: Secondary | ICD-10-CM | POA: Diagnosis not present

## 2018-07-15 DIAGNOSIS — N39498 Other specified urinary incontinence: Secondary | ICD-10-CM | POA: Diagnosis not present

## 2018-07-15 DIAGNOSIS — R4182 Altered mental status, unspecified: Secondary | ICD-10-CM | POA: Diagnosis not present

## 2018-07-15 DIAGNOSIS — S0990XA Unspecified injury of head, initial encounter: Secondary | ICD-10-CM | POA: Diagnosis not present

## 2018-07-15 DIAGNOSIS — I5030 Unspecified diastolic (congestive) heart failure: Secondary | ICD-10-CM | POA: Diagnosis not present

## 2018-07-15 DIAGNOSIS — R441 Visual hallucinations: Secondary | ICD-10-CM | POA: Diagnosis not present

## 2018-07-15 DIAGNOSIS — I959 Hypotension, unspecified: Secondary | ICD-10-CM | POA: Diagnosis not present

## 2018-07-15 DIAGNOSIS — Z7901 Long term (current) use of anticoagulants: Secondary | ICD-10-CM | POA: Diagnosis not present

## 2018-07-15 DIAGNOSIS — E162 Hypoglycemia, unspecified: Secondary | ICD-10-CM | POA: Diagnosis not present

## 2018-07-15 DIAGNOSIS — Z9181 History of falling: Secondary | ICD-10-CM | POA: Diagnosis not present

## 2018-07-15 DIAGNOSIS — M1A0620 Idiopathic chronic gout, left knee, without tophus (tophi): Secondary | ICD-10-CM

## 2018-07-15 DIAGNOSIS — Z905 Acquired absence of kidney: Secondary | ICD-10-CM | POA: Diagnosis not present

## 2018-07-15 DIAGNOSIS — M25562 Pain in left knee: Secondary | ICD-10-CM | POA: Diagnosis not present

## 2018-07-15 DIAGNOSIS — F29 Unspecified psychosis not due to a substance or known physiological condition: Secondary | ICD-10-CM | POA: Diagnosis not present

## 2018-07-15 DIAGNOSIS — I480 Paroxysmal atrial fibrillation: Secondary | ICD-10-CM | POA: Diagnosis not present

## 2018-07-15 DIAGNOSIS — M109 Gout, unspecified: Secondary | ICD-10-CM | POA: Diagnosis not present

## 2018-07-15 DIAGNOSIS — R443 Hallucinations, unspecified: Secondary | ICD-10-CM | POA: Diagnosis not present

## 2018-07-15 DIAGNOSIS — F99 Mental disorder, not otherwise specified: Secondary | ICD-10-CM | POA: Diagnosis not present

## 2018-07-15 DIAGNOSIS — N179 Acute kidney failure, unspecified: Secondary | ICD-10-CM | POA: Diagnosis not present

## 2018-07-15 DIAGNOSIS — Z95810 Presence of automatic (implantable) cardiac defibrillator: Secondary | ICD-10-CM | POA: Diagnosis not present

## 2018-07-15 DIAGNOSIS — F419 Anxiety disorder, unspecified: Secondary | ICD-10-CM | POA: Diagnosis not present

## 2018-07-15 DIAGNOSIS — I252 Old myocardial infarction: Secondary | ICD-10-CM | POA: Diagnosis not present

## 2018-07-15 DIAGNOSIS — R32 Unspecified urinary incontinence: Secondary | ICD-10-CM | POA: Diagnosis not present

## 2018-07-15 MED ORDER — ALBUTEROL SULFATE (2.5 MG/3ML) 0.083% IN NEBU: 2.5000 mg | INHALATION_SOLUTION | RESPIRATORY_TRACT | 12 refills | Status: DC | PRN

## 2018-07-15 MED ORDER — DICLOFENAC SODIUM 1 % TD GEL: 2.0000 g | Freq: Four times a day (QID) | TRANSDERMAL | 0 refills | Status: DC

## 2018-07-15 NOTE — Clinical Social Work Placement (Signed)
   CLINICAL SOCIAL WORK PLACEMENT  NOTE  Date:  07/15/2018  Patient Details  Name: Cody Faulkner MRN: [id number] Date of Birth: 1946-02-29  Clinical Social Work is seeking post-discharge placement for this patient at the [place] level of care (*CSW will initial, date and re-position this form in  chart as items are completed):      Patient/family provided with [place] Work Department's list of facilities offering this level of care within the geographic area requested by the patient (or if unable, by the patient's family).  Yes   Patient/family informed of their freedom to choose among providers that offer the needed level of care, that participate in Medicare, Medicaid or managed care program needed by the patient, have an available bed and are willing to accept the patient.      Patient/family informed of [place]'s ownership interest in [facility] and [facility], as well as of the fact that they are under no obligation to receive care at these facilities.  PASRR submitted to EDS on       PASRR number received on 07/13/18     Existing PASRR number confirmed on       FL2 transmitted to all facilities in geographic area requested by pt/family on       FL2 transmitted to all facilities within larger geographic area on       Patient informed that his/her managed care company has contracts with or will negotiate with certain facilities, including the following:        Yes   Patient/family informed of bed offers received.  Patient chooses bed at [facility] [place] [place]     Physician recommends and patient chooses bed at      Patient to be transferred to [place], [place] on 07/15/18.  Patient to be transferred to facility by [facility]     Patient family notified on 07/15/18 of transfer.  Name of family member notified:  [name]     PHYSICIAN       Additional Comment:     _______________________________________________ Alberteen Sam, LCSW 07/15/2018, 1:13 PM

## 2018-07-15 NOTE — Progress Notes (Signed)
   Subjective:  Cody Faulkner is a 73 y.o. with PMH of PAF, HFpEF, CKD3a, OSA and HLD admit for recurrent falls on hospital day 3  Mr.Cody Faulkner was examined and evaluated at bedside this AM. He was observed sleeping comfortably in bed. He states his left knee was bothering him overnight but putting the voltaren gel on the underside of his knees have been making the pain bearable. He denies any other complaints.   Objective:  Vital signs in last 24 hours: Vitals:   07/14/18 1346 07/14/18 2016 07/14/18 2020 07/15/18 0514  BP: 99/61  (!) 142/64 123/65  Pulse: (!) 58  63 60  Resp: 20  18 16   Temp: 98.4 F (36.9 C)  97.8 F (36.6 C) 98.2 F (36.8 C)  TempSrc: Oral  Oral Oral  SpO2: 95% 94% 95% 93%   Gen: Well-developed, well nourished, NAD HEENT: Hematoma over his left eye with more discoloration to purple. Stable size at the moment. Wide-spread ecchymosis of his face. No nasal discharge, MMM Neck: supple, ROM intact CV: RRR, S1, S2 normal, No rubs, no murmurs, no gallops Pulm: CTAB, No rales, no wheezes Extm: Bilateral swollen knees with arthritic changes and bony protrusions. Warm but not erythematous. No obvious palpable effusion. Distal pulses intact. No peripheral edema Skin: Dry, Warm, normal turgor, chronic hyperpigmentation over lower shins Neuro: AAOx3 Psych: Normal mood and affect  Assessment/Plan:  Active Problems:   PAF (paroxysmal atrial fibrillation) (HCC)   ICD (implantable cardioverter-defibrillator) in place   OSA (obstructive sleep apnea)   Weakness   Unsteady gait   Primary osteoarthritis of left knee   HTN (hypertension), benign  Mr.Cody Faulkner is a 73yo M w/ PMH of Paroxysmal A.fib on Eliquis, HFpEF s/p ICD, CKD3a, OSA,DJDand HLDadmit after fall. He is stable for discharge and pain is manageable with tylenol and voltaren gel. Both PT and OT recommend SNF for discharge. Awaiting insurance authorization.  Mechanical fall 2/2 degenerative joint disease -  Appreciate Social Work support; Teaching laboratory technician authorization for SNF - Appreciate PT/OT recs: discharge to SNF - Tylenol, Voltaren gel, lidocaine patch for pain - F/u with ortho as outpatient  HTN Current in bp 125/62 - C/w home meds: hydralazine 25mg  BID, losartan 100mg  daily  Chronic diastolic heart failure Last echo fromDecember 2019showed LV EF 93%, grade 1 diastolic dysfunction. Currently on oral furosemide for lower extremity edema. - C/w home meds: furosemide 80mg  daily, hydralazine 25mg  BID, imdur 60mg  daily  DVT prophx: Eliquis Diet: Cardiac Bowel: Senokot Code: Full  Dispo: Anticipated discharge in approximately 0-1 day(s).   [name], MD 07/15/2018, 6:33 AM Pager: [phone]

## 2018-07-15 NOTE — Plan of Care (Signed)
  Problem: Education: Goal: Knowledge of General Education information will improve Description Including pain rating scale, medication(s)/side effects and non-pharmacologic comfort measures 07/15/2018 1338 by Rance Muir, RN Outcome: Adequate for Discharge 07/15/2018 1156 by Rance Muir, RN Outcome: Progressing   Problem: Health Behavior/Discharge Planning: Goal: Ability to manage health-related needs will improve 07/15/2018 1338 by Rance Muir, RN Outcome: Adequate for Discharge 07/15/2018 1156 by Rance Muir, RN Outcome: Progressing   Problem: Clinical Measurements: Goal: Ability to maintain clinical measurements within normal limits will improve 07/15/2018 1338 by Rance Muir, RN Outcome: Adequate for Discharge 07/15/2018 1156 by Rance Muir, RN Outcome: Progressing Goal: Will remain free from infection 07/15/2018 1338 by Rance Muir, RN Outcome: Adequate for Discharge 07/15/2018 1156 by Rance Muir, RN Outcome: Progressing Goal: Diagnostic test results will improve 07/15/2018 1338 by Rance Muir, RN Outcome: Adequate for Discharge 07/15/2018 1156 by Rance Muir, RN Outcome: Progressing Goal: Respiratory complications will improve 07/15/2018 1338 by Rance Muir, RN Outcome: Adequate for Discharge 07/15/2018 1156 by Rance Muir, RN Outcome: Progressing Goal: Cardiovascular complication will be avoided 07/15/2018 1338 by Rance Muir, RN Outcome: Adequate for Discharge 07/15/2018 1156 by Rance Muir, RN Outcome: Progressing   Problem: Activity: Goal: Risk for activity intolerance will decrease 07/15/2018 1338 by Rance Muir, RN Outcome: Adequate for Discharge 07/15/2018 1156 by Rance Muir, RN Outcome: Progressing   Problem: Nutrition: Goal: Adequate nutrition will be maintained 07/15/2018 1338 by Rance Muir, RN Outcome: Adequate for Discharge 07/15/2018 1156 by Rance Muir, RN Outcome: Progressing   Problem: Coping: Goal: Level of anxiety will decrease 07/15/2018 1338 by Rance Muir, RN Outcome: Adequate for Discharge 07/15/2018  1156 by Rance Muir, RN Outcome: Progressing   Problem: Elimination: Goal: Will not experience complications related to bowel motility 07/15/2018 1338 by Rance Muir, RN Outcome: Adequate for Discharge 07/15/2018 1156 by Rance Muir, RN Outcome: Progressing Goal: Will not experience complications related to urinary retention 07/15/2018 1338 by Rance Muir, RN Outcome: Adequate for Discharge 07/15/2018 1156 by Rance Muir, RN Outcome: Progressing   Problem: Pain Managment: Goal: General experience of comfort will improve 07/15/2018 1338 by Rance Muir, RN Outcome: Adequate for Discharge 07/15/2018 1156 by Rance Muir, RN Outcome: Progressing   Problem: Safety: Goal: Ability to remain free from injury will improve 07/15/2018 1338 by Rance Muir, RN Outcome: Adequate for Discharge 07/15/2018 1156 by Rance Muir, RN Outcome: Progressing   Problem: Skin Integrity: Goal: Risk for impaired skin integrity will decrease 07/15/2018 1338 by Rance Muir, RN Outcome: Adequate for Discharge 07/15/2018 1156 by Rance Muir, RN Outcome: Progressing

## 2018-07-15 NOTE — Progress Notes (Signed)
Physical Therapy Treatment Patient Details Name: Cody Faulkner MRN: [id number] DOB: [birth year] Today's Date: 07/15/2018    History of Present Illness Mr.Wisser is a 73yo M w/ PMH of Paroxysmal A.fib on Eliquis, HFpEF s/p ICD, CKD3a, OSA, DJD and HLD presenting with fall. He has increasing falls recently and his x ray revealed DJD L knee, and CT head showed large superficial hematoma of his left forehead but no intracranial mass or hemorrhage.    PT Comments    Patient seen for mobility progression. Pt is eager to participate in therapy and is making progress toward PT goals. Pt is able to ambulate 10 ft X 3 trials with seated breaks due to fatigue and L LE pain. Pt requires +2 assist for functional transfers and gait training. Continue to progress as tolerated with anticipated d/c to SNF for further skilled PT services.      Follow Up Recommendations  SNF;Supervision/Assistance - 24 hour     Equipment Recommendations  Other (comment)(TBD next venue)    Recommendations for Other Services       Precautions / Restrictions Precautions Precautions: Fall Precaution Comments: vision limited on L Restrictions Weight Bearing Restrictions: No    Mobility  Bed Mobility Overal bed mobility: Needs Assistance Bed Mobility: Supine to Sit     Supine to sit: Supervision     General bed mobility comments: increased time required; use of bed rails  Transfers Overall transfer level: Needs assistance Equipment used: Rolling walker (2 wheeled) Transfers: Sit to/from Stand Sit to Stand: Mod assist;+2 physical assistance         General transfer comment: assist to power up into standing and to gain balance upon stand; cues for safe hand placement  Ambulation/Gait Ambulation/Gait assistance: Mod assist;+2 safety/equipment Gait Distance (Feet): (10 ft X 3 trials with seated rest breaks) Assistive device: Rolling walker (2 wheeled) Gait Pattern/deviations: Step-to pattern;Decreased stance  time - left;Decreased step length - right;Decreased weight shift to left;Antalgic;Trunk flexed Gait velocity: slow   General Gait Details: cues for posture, safe proximity to RW, and R step height; assistance for balance and guiding RW; LOB due to poor R foot clearance   Stairs             Wheelchair Mobility    Modified Rankin (Stroke Patients Only)       Balance Overall balance assessment: Needs assistance;History of Falls Sitting-balance support: Feet supported;Single extremity supported Sitting balance-[name]: Good     Standing balance support: Bilateral upper extremity supported Standing balance-[name]: Poor Standing balance comment: dependent on RW and external support                            Cognition Arousal/Alertness: Awake/alert Behavior During Therapy: WFL for tasks assessed/performed Overall Cognitive Status: Within Functional Limits for tasks assessed                                        Exercises      General Comments General comments (skin integrity, edema, etc.): Pt VERY motivated      Pertinent Vitals/Pain Pain Assessment: Faces Faces Pain Scale: Hurts little more Pain Location: L knee Pain Descriptors / Indicators: Discomfort;Sore;Tender;Guarding;Grimacing Pain Intervention(s): Limited activity within patient's tolerance;Monitored during session;Repositioned    Home Living  Prior Function            PT Goals (current goals can now be found in the care plan section) Acute Rehab PT Goals Patient Stated Goal: go to rehab to get stronger Progress towards PT goals: Progressing toward goals    Frequency    Min 2X/week      PT Plan Current plan remains appropriate    Co-evaluation PT/OT/SLP Co-Evaluation/Treatment: Yes Reason for Co-Treatment: For patient/therapist safety;To address functional/ADL transfers PT goals addressed during session: Mobility/safety  with mobility;Balance;Proper use of DME;Strengthening/ROM OT goals addressed during session: ADL's and self-care;Proper use of Adaptive equipment and DME      AM-PAC PT "6 Clicks" Mobility   Outcome Measure  Help needed turning from your back to your side while in a flat bed without using bedrails?: A Little Help needed moving from lying on your back to sitting on the side of a flat bed without using bedrails?: A Little Help needed moving to and from a bed to a chair (including a wheelchair)?: A Lot Help needed standing up from a chair using your arms (e.g., wheelchair or bedside chair)?: A Lot Help needed to walk in hospital room?: A Lot Help needed climbing 3-5 steps with a railing? : Total 6 Click Score: 13    End of Session Equipment Utilized During Treatment: Gait belt Activity Tolerance: Patient tolerated treatment well Patient left: in chair;with call bell/phone within reach;with chair alarm set Nurse Communication: Mobility status PT Visit Diagnosis: Unsteadiness on feet (R26.81);Repeated falls (R29.6);Muscle weakness (generalized) (M62.81);Pain Pain - Right/Left: Left Pain - part of body: Knee     Time: 4008-6761 PT Time Calculation (min) (ACUTE ONLY): 34 min  Charges:  $Gait Training: 8-22 mins                     Earney Navy, PTA Acute Rehabilitation Services Pager: [phone] Office: [phone]     Darliss Cheney 07/15/2018, 2:14 PM

## 2018-07-15 NOTE — Progress Notes (Signed)
Patient will DC to: [facility] Anticipated DC date: 07/15/2018 Family notified:Eric Transport [place]:[facility]  Per MD patient ready for DC to [facility] . RN, patient, patient's family, and facility notified of DC. Discharge Summary sent to facility. RN given number for report [phone] Room 105. DC packet on chart. Ambulance transport requested for patient.  CSW signing off.  [place], [place]

## 2018-07-15 NOTE — Progress Notes (Signed)
Report called to [facility] and given to christina

## 2018-07-15 NOTE — Care Management Important Message (Signed)
Important Message  Patient Details  Name: Cody Faulkner MRN: [id number] Date of Birth: Aug 25, 1945   Medicare Important Message Given:  Yes    Orbie Pyo 07/15/2018, 3:37 PM

## 2018-07-15 NOTE — Progress Notes (Signed)
Medicine attending: I examined this patient today together with resident physician Dr. Gilberto Better and I concur with his evaluation and management plan. No acute change.  Still waiting for authorization on a skilled nursing facility bed.

## 2018-07-15 NOTE — Progress Notes (Signed)
Occupational Therapy Treatment Patient Details Name: Cody Faulkner MRN: [id number] DOB: 02-17-1946 Today's Date: 07/15/2018    History of present illness Cody Faulkner is a 73yo M w/ PMH of Paroxysmal A.fib on Eliquis, HFpEF s/p ICD, CKD3a, OSA, DJD and HLD presenting with fall. He has increasing falls recently and his x ray revealed DJD L knee, and CT head showed large superficial hematoma of his left forehead but no intracranial mass or hemorrhage.   OT comments  Pt progressing towards OT goals this session, mod A +2 for multiple sit <>stand transfers for hallway ambulation with chair follow as well as grooming (oral care, wash face/hands) in bathroom. Pt EXTREMELY motivated and eager to work with therapy. Current POC remains appropriate.    Follow Up Recommendations  SNF;Supervision/Assistance - 24 hour    Equipment Recommendations  Other (comment)(defer to next venue of care)    Recommendations for Other Services      Precautions / Restrictions Precautions Precautions: Fall Precaution Comments: vision limited on L Restrictions Weight Bearing Restrictions: No       Mobility Bed Mobility Overal bed mobility: Needs Assistance Bed Mobility: Supine to Sit     Supine to sit: Supervision     General bed mobility comments: increased time required; use of bed rails  Transfers Overall transfer level: Needs assistance Equipment used: Rolling walker (2 wheeled) Transfers: Sit to/from Stand Sit to Stand: Mod assist;+2 physical assistance         General transfer comment: Pt mod A for boost, and balance upon standing    Balance Overall balance assessment: Needs assistance;History of Falls Sitting-balance support: Feet supported;Single extremity supported Sitting balance-[name]: Good     Standing balance support: Bilateral upper extremity supported Standing balance-[name]: Poor Standing balance comment: dependent on RW and external support                            ADL either performed or assessed with clinical judgement   ADL Overall ADL's : Needs assistance/impaired     Grooming: Sitting;Wash/dry hands;Wash/dry face;Oral care;Minimal assistance Grooming Details (indicate cue type and reason): sitting in recliner at sink, limited FF ROM (baseline) from shoulder                 Toilet Transfer: Moderate assistance;+2 for safety/equipment;Ambulation;RW Toilet Transfer Details (indicate cue type and reason): increased time for mobility Toileting- Clothing Manipulation and Hygiene: Maximal assistance;+2 for physical assistance;+2 for safety/equipment       Functional mobility during ADLs: Moderate assistance;+2 for safety/equipment;Rolling walker;Cueing for sequencing(SPT only) General ADL Comments: Pt with decreased access to LB for ADL, pain     Vision   Vision Assessment?: Vision impaired- to be further tested in functional context Additional Comments: did not test this session due to swelling   Perception     Praxis      Cognition Arousal/Alertness: Awake/alert Behavior During Therapy: WFL for tasks assessed/performed Overall Cognitive Status: Within Functional Limits for tasks assessed                                          Exercises     Shoulder Instructions       General Comments Pt VERY motivated    Pertinent Vitals/ Pain       Pain Assessment: Faces Faces Pain Scale: Hurts little more Pain Location: L knee  Pain Descriptors / Indicators: Discomfort;Sore;Tender;Guarding;Grimacing Pain Intervention(s): Monitored during session;Repositioned  Home Living                                          Prior Functioning/Environment              Frequency  Min 2X/week        Progress Toward Goals  OT Goals(current goals can now be found in the care plan section)  Progress towards OT goals: Progressing toward goals  Acute Rehab OT Goals Patient Stated Goal:  go to rehab to get stronger OT Goal Formulation: With patient Time For Goal Achievement: 07/27/18 Potential to Achieve Goals: Good  Plan Discharge plan remains appropriate;Frequency remains appropriate    Co-evaluation    PT/OT/SLP Co-Evaluation/Treatment: Yes Reason for Co-Treatment: For patient/therapist safety;To address functional/ADL transfers PT goals addressed during session: Mobility/safety with mobility;Balance;Proper use of DME;Strengthening/ROM OT goals addressed during session: ADL's and self-care;Proper use of Adaptive equipment and DME      AM-PAC OT "6 Clicks" Daily Activity     Outcome Measure   Help from another person eating meals?: A Little Help from another person taking care of personal grooming?: A Little Help from another person toileting, which includes using toliet, bedpan, or urinal?: A Lot Help from another person bathing (including washing, rinsing, drying)?: A Lot Help from another person to put on and taking off regular upper body clothing?: A Little Help from another person to put on and taking off regular lower body clothing?: A Lot 6 Click Score: 15    End of Session Equipment Utilized During Treatment: Gait belt;Rolling walker  OT Visit Diagnosis: Unsteadiness on feet (R26.81);Other abnormalities of gait and mobility (R26.89);Repeated falls (R29.6);History of falling (Z91.81);Muscle weakness (generalized) (M62.81);Pain Pain - Right/Left: Left Pain - part of body: Knee   Activity Tolerance Patient tolerated treatment well   Patient Left in chair;with call bell/phone within reach;with chair alarm set   Nurse Communication Mobility status        Time: 5537-4827 OT Time Calculation (min): 32 min  Charges: OT General Charges $OT Visit: 1 Visit OT Treatments $Self Care/Home Management : 8-22 mins  Hulda Humphrey OTR/L Acute Rehabilitation Services Pager: [phone] Office: [place] 07/15/2018, 1:28 PM

## 2018-07-15 NOTE — Plan of Care (Signed)

## 2018-07-16 ENCOUNTER — Other Ambulatory Visit: Payer: Self-pay | Admitting: Internal Medicine

## 2018-07-16 DIAGNOSIS — M1712 Unilateral primary osteoarthritis, left knee: Secondary | ICD-10-CM | POA: Diagnosis not present

## 2018-07-16 DIAGNOSIS — I119 Hypertensive heart disease without heart failure: Secondary | ICD-10-CM

## 2018-07-16 DIAGNOSIS — S0510XA Contusion of eyeball and orbital tissues, unspecified eye, initial encounter: Secondary | ICD-10-CM | POA: Diagnosis not present

## 2018-07-16 DIAGNOSIS — E038 Other specified hypothyroidism: Secondary | ICD-10-CM | POA: Diagnosis not present

## 2018-07-16 DIAGNOSIS — R2689 Other abnormalities of gait and mobility: Secondary | ICD-10-CM | POA: Diagnosis not present

## 2018-07-21 ENCOUNTER — Ambulatory Visit (INDEPENDENT_AMBULATORY_CARE_PROVIDER_SITE_OTHER): Payer: Medicare Other | Admitting: Orthopaedic Surgery

## 2018-07-21 ENCOUNTER — Encounter (INDEPENDENT_AMBULATORY_CARE_PROVIDER_SITE_OTHER): Payer: Self-pay | Admitting: Orthopaedic Surgery

## 2018-07-21 VITALS — Ht 65.0 in | Wt 200.0 lb

## 2018-07-21 DIAGNOSIS — M25562 Pain in left knee: Secondary | ICD-10-CM

## 2018-07-21 DIAGNOSIS — G8929 Other chronic pain: Secondary | ICD-10-CM

## 2018-07-21 DIAGNOSIS — M1712 Unilateral primary osteoarthritis, left knee: Secondary | ICD-10-CM | POA: Diagnosis not present

## 2018-07-21 MED ORDER — LIDOCAINE HCL 1 % IJ SOLN
3.0000 mL | INTRAMUSCULAR | Status: AC | PRN
  Administered 2018-07-21: 3 mL

## 2018-07-21 MED ORDER — METHYLPREDNISOLONE ACETATE 40 MG/ML IJ SUSP
40.0000 mg | INTRAMUSCULAR | Status: AC | PRN
  Administered 2018-07-21: 40 mg via INTRA_ARTICULAR

## 2018-07-21 NOTE — Progress Notes (Signed)
Office Visit Note   Patient: Cody Faulkner           Date of Birth: 05/21/1946           MRN: [id number] Visit Date: 07/21/2018              Requested by: Cyndi Bender, PA-C [address] [place], [place] [address] PCP: Cyndi Bender, PA-C   Assessment & Plan: Visit Diagnoses:  1. Chronic pain of left knee   2. Unilateral primary osteoarthritis, left knee     Plan: I did speak with him in detail about trying a steroid injection and aspiration of his left knee to help get him through rehabilitation.  He agreed with this.  I did place steroid injection in his knee after attempting to aspirate fluid from his knee which only showed a mild effusion today that was less than 10 cc of fluid.  I placed a steroid in his knee without difficulty.  From my standpoint follow-up should be as needed.  I did give him my card in order for him to eventually come back and see [place] once he is back on his own and has his medical issues at least improved to consider surgery.  Follow-Up Instructions: Return if symptoms worsen or fail to improve.   Orders:  Orders Placed This Encounter  Procedures  . Large Joint Inj   No orders of the defined types were placed in this encounter.     Procedures: Large Joint Inj: L knee on 07/21/2018 1:20 PM Indications: diagnostic evaluation and pain Details: 22 G 1.5 in needle, superolateral approach  Arthrogram: No  Medications: 3 mL lidocaine 1 %; 40 mg methylPREDNISolone acetate 40 MG/ML Outcome: tolerated well, no immediate complications Procedure, treatment alternatives, risks and benefits explained, specific risks discussed. Consent was given by the patient. Immediately prior to procedure a time out was called to verify the correct patient, procedure, equipment, support staff and site/side marked as required. Patient was prepped and draped in the usual sterile fashion.       Clinical Data: No additional findings.   Subjective: Chief Complaint    Patient presents with  . Left Knee - Pain  The patient [facility] to evaluate and treat left knee pain and osteoarthritis.  He is actually at the skilled nursing facility now rehabilitating from a significant fall in which he sustained contusions to his head and face.  He does live alone.  He reports significant knee pain that is had for a long period of time.  He does have an implantable device which I think is a defibrillator versus a pacer.  He has chronic kidney disease.  He does have significant medical problems and does live alone.  He has not had any type of injection in his knee recently.  He is needing to be able to get around better to get home.  I did review his medical records on epic to get a better idea of what is going on with  HPI  Review of Systems .  Today he denies any fever, chills, nausea, vomiting.  He also denies any shortness of breath headache or chest pain.  Objective: Vital Signs: Ht 5\' 5"  (1.651 m)   Wt 200 lb (90.7 kg)   BMI 33.28 kg/m   Physical Exam He is alert and oriented and follows commands.  He appears in no acute distress.  He is not sure he states how long he will be  at skilled nursing. Ortho Exam Examination of his left knee does show just a mild effusion.  There is varus malalignment of the knee.  He has good range of motion the knee but there is a lot of pain along the medial joint line at the patellofemoral joint with crepitation.  He has significant venous stasis changes in the skin of his bilateral lower extremities. Specialty Comments:  No specialty comments available.  Imaging: No results found. X-rays of the left knee independently reviewed on the canopy system do show severe osteoarthritis of the left knee with varus malalignment and significant particular osteophytes and joint space narrowing consistent with severe osteoarthritis.  PMFS History: Patient Active Problem List   Diagnosis Date Noted  .  Chronic gout of left knee   . Unsteady gait   . Primary osteoarthritis of left knee   . HTN (hypertension), benign   . Weakness 07/12/2018  . Delirium 05/30/2018  . On amiodarone therapy 05/18/2018  . Chronic anticoagulation- Coumadin 12/23/2013  . Sinus node dysfunction ([place]) 12/21/2013  . Paroxysmal ventricular tachycardia ([place]) 12/21/2013  . ICD (implantable cardioverter-defibrillator) lead failure 12/20/2013  . Obesity hypoventilation syndrome ([place]) 12/07/2012  . History of hyperthyroidism   . CKD (chronic kidney disease) stage 3, GFR 30-59 ml/min (HCC) 06/23/2011  . Morbid obesity   . Long term current use of anticoagulant therapy   . OSA (obstructive sleep apnea)   . Hypertensive heart disease with heart failure ([place])   . Solitary kidney   . Community acquired pneumonia 06/06/2011  . Acute encephalopathy 06/06/2011  . Hypothyroidism   . Mild persistent asthma   . PAF (paroxysmal atrial fibrillation) ([place])   . ICD (implantable cardioverter-defibrillator) in place   . History of dilated cardiomyopathy- last EF 50-55% 2D July 2014   . Chronic combined systolic and diastolic heart failure ([place])   . CAD nonobstructive- 2007 11/12/2005   Past Medical History:  Diagnosis Date  . Asthma    Albuterol prn;Symbicort daily  . Atrial fibrillation ([place])    takes COumadin daily  . Bruises easily    d/t being on Coumadin  . CAD (coronary artery disease)    predominantly single vessel  . Cardiomyopathy, ischemic   . Cataracts, bilateral to be removed in Aug 2015  . Chronic kidney disease 1976   S/P nephrectomy  . Constipation    takes Colace daily as needed  . Depression   . Dilated cardiomyopathy ([place])    s/p defibrillator [employer] [phone]  . Dizziness    occasionally  . GERD (gastroesophageal reflux disease)    takes OTC med if needed  . Gout    takes Allopurinol daily  . Heart failure ([place])    NYHFA     CLASS 3  . History of hyperthyroidism   . Hyperlipidemia      takes Fenofibrate daily  . Hypertension    takes Bisoprolol daily as well as Imdur  . Hypothyroidism    takes Synthroid daily  . Joint pain   . Joint swelling   . Left knee DJD    needs surgery  . Myocardial infarction ([place]) 2007  . OSA (obstructive sleep apnea)    "don't wear mask; can't afford one" (12/20/2013)  . Pacemaker   . Peripheral edema    takes Furosemide daily  . Pneumonia, community acquired last time 2014   "get it ~ q yr; haven't had it yet in 2015" (12/20/2013)  . Seasonal allergies    takes Claritin daily  .  Shortness of breath    with exertion  . Solitary kidney 06/05/2006  . Urinary frequency   . Urinary incontinence   . Urinary urgency   . Ventricular tachycardia [facility])    s/p defibrillator-[employer] [phone]    Family History  Problem Relation Age of Onset  . Heart disease Other        uncle  . Diabetes Other        uncle    Past Surgical History:  Procedure Laterality Date  . CARDIAC CATHETERIZATION  2007   60% Stenosis mid-CFX  . CARDIAC DEFIBRILLATOR PLACEMENT  2007   [employer] [phone]  . CARDIAC DEFIBRILLATOR REMOVAL  12/20/2013  . CARDIOVERSION  06/18/2011   Procedure: CARDIOVERSION;  Surgeon: Kerry Hough., MD;  Location: [place];  Service: Cardiovascular;  Laterality: N/A;  . CARDIOVERSION N/A 12/06/2012   Procedure: CARDIOVERSION;  Surgeon: Jacolyn Reedy, MD;  Location: [place];  Service: Cardiovascular;  Laterality: N/A;  . COLONOSCOPY    . ICD GENERATOR CHANGE  07/2012  . ICD LEAD REMOVAL Left 12/20/2013   Procedure: ICD LEAD REMOVAL//EXTRACTION AND PLACEMENT OF TEMP/PERM ATRIAL LEAD;  Surgeon: Evans Lance, MD;  Location: [place];  Service: Cardiovascular;  Laterality: Left;  . IMPLANTABLE CARDIOVERTER DEFIBRILLATOR IMPLANT N/A 01/25/2014   Procedure: IMPLANTABLE CARDIOVERTER DEFIBRILLATOR IMPLANT;  Surgeon: Evans Lance, MD;  Location: [facility] CATH LAB;  Service: Cardiovascular;  Laterality: N/A;  . IMPLANTABLE  CARDIOVERTER DEFIBRILLATOR REVISION N/A 07/13/2012   Procedure: IMPLANTABLE CARDIOVERTER DEFIBRILLATOR REVISION;  Surgeon: Deboraha Sprang, MD;  Location: [facility] CATH LAB;  Service: Cardiovascular;  Laterality: N/A;  . INSERT / REPLACE / REMOVE PACEMAKER  12/20/2013  . KNEE ARTHROSCOPY Left   . MULTIPLE TOOTH EXTRACTIONS     "top's are all gone; took some off the bottom too"  . NEPHRECTOMY  1976   Right  . TEE WITHOUT CARDIOVERSION  07/13/2012   Procedure: TRANSESOPHAGEAL ECHOCARDIOGRAM (TEE);  Surgeon: Lelon Perla, MD;  Location: [facility] ENDOSCOPY;  Service: Cardiovascular;  Laterality: N/A;   Social History   Occupational History  . Occupation: disabled  Tobacco Use  . Smoking status: Never Smoker  . Smokeless tobacco: Never Used  Substance and Sexual Activity  . Alcohol use: Yes    Comment: 12/20/2013 "might have drank a couple beers before; never really drank"  . Drug use: No  . Sexual activity: Not Currently

## 2018-07-24 DIAGNOSIS — R2689 Other abnormalities of gait and mobility: Secondary | ICD-10-CM | POA: Diagnosis not present

## 2018-07-24 DIAGNOSIS — M1712 Unilateral primary osteoarthritis, left knee: Secondary | ICD-10-CM | POA: Diagnosis not present

## 2018-07-24 DIAGNOSIS — I2510 Atherosclerotic heart disease of native coronary artery without angina pectoris: Secondary | ICD-10-CM | POA: Diagnosis not present

## 2018-07-26 ENCOUNTER — Other Ambulatory Visit: Payer: Self-pay

## 2018-07-26 ENCOUNTER — Emergency Department (HOSPITAL_COMMUNITY): Payer: Medicare Other

## 2018-07-26 ENCOUNTER — Inpatient Hospital Stay (HOSPITAL_COMMUNITY)
Admission: EM | Admit: 2018-07-26 | Discharge: 2018-08-23 | DRG: 885 | Disposition: A | Discharge status: Home Health | Payer: Medicare Other | Source: Skilled Nursing Facility | Attending: Internal Medicine | Admitting: Internal Medicine

## 2018-07-26 DIAGNOSIS — G4700 Insomnia, unspecified: Secondary | ICD-10-CM | POA: Diagnosis present

## 2018-07-26 DIAGNOSIS — J449 Chronic obstructive pulmonary disease, unspecified: Secondary | ICD-10-CM | POA: Diagnosis present

## 2018-07-26 DIAGNOSIS — E162 Hypoglycemia, unspecified: Secondary | ICD-10-CM | POA: Diagnosis present

## 2018-07-26 DIAGNOSIS — I878 Other specified disorders of veins: Secondary | ICD-10-CM | POA: Diagnosis not present

## 2018-07-26 DIAGNOSIS — I2510 Atherosclerotic heart disease of native coronary artery without angina pectoris: Secondary | ICD-10-CM | POA: Diagnosis not present

## 2018-07-26 DIAGNOSIS — Z9181 History of falling: Secondary | ICD-10-CM | POA: Diagnosis not present

## 2018-07-26 DIAGNOSIS — R339 Retention of urine, unspecified: Secondary | ICD-10-CM | POA: Diagnosis not present

## 2018-07-26 DIAGNOSIS — F99 Mental disorder, not otherwise specified: Secondary | ICD-10-CM | POA: Diagnosis not present

## 2018-07-26 DIAGNOSIS — E538 Deficiency of other specified B group vitamins: Secondary | ICD-10-CM | POA: Diagnosis present

## 2018-07-26 DIAGNOSIS — I130 Hypertensive heart and chronic kidney disease with heart failure and stage 1 through stage 4 chronic kidney disease, or unspecified chronic kidney disease: Secondary | ICD-10-CM | POA: Diagnosis present

## 2018-07-26 DIAGNOSIS — S0990XA Unspecified injury of head, initial encounter: Secondary | ICD-10-CM | POA: Diagnosis not present

## 2018-07-26 DIAGNOSIS — R440 Auditory hallucinations: Secondary | ICD-10-CM | POA: Diagnosis not present

## 2018-07-26 DIAGNOSIS — Z905 Acquired absence of kidney: Secondary | ICD-10-CM | POA: Diagnosis not present

## 2018-07-26 DIAGNOSIS — M170 Bilateral primary osteoarthritis of knee: Secondary | ICD-10-CM | POA: Diagnosis present

## 2018-07-26 DIAGNOSIS — R05 Cough: Secondary | ICD-10-CM | POA: Diagnosis not present

## 2018-07-26 DIAGNOSIS — H9190 Unspecified hearing loss, unspecified ear: Secondary | ICD-10-CM | POA: Diagnosis not present

## 2018-07-26 DIAGNOSIS — F29 Unspecified psychosis not due to a substance or known physiological condition: Secondary | ICD-10-CM | POA: Diagnosis not present

## 2018-07-26 DIAGNOSIS — F329 Major depressive disorder, single episode, unspecified: Secondary | ICD-10-CM | POA: Diagnosis not present

## 2018-07-26 DIAGNOSIS — F323 Major depressive disorder, single episode, severe with psychotic features: Secondary | ICD-10-CM | POA: Diagnosis not present

## 2018-07-26 DIAGNOSIS — I252 Old myocardial infarction: Secondary | ICD-10-CM

## 2018-07-26 DIAGNOSIS — E785 Hyperlipidemia, unspecified: Secondary | ICD-10-CM | POA: Diagnosis not present

## 2018-07-26 DIAGNOSIS — R4182 Altered mental status, unspecified: Secondary | ICD-10-CM | POA: Diagnosis not present

## 2018-07-26 DIAGNOSIS — M109 Gout, unspecified: Secondary | ICD-10-CM | POA: Diagnosis present

## 2018-07-26 DIAGNOSIS — I440 Atrioventricular block, first degree: Secondary | ICD-10-CM | POA: Diagnosis present

## 2018-07-26 DIAGNOSIS — Z95810 Presence of automatic (implantable) cardiac defibrillator: Secondary | ICD-10-CM

## 2018-07-26 DIAGNOSIS — I5030 Unspecified diastolic (congestive) heart failure: Secondary | ICD-10-CM | POA: Diagnosis not present

## 2018-07-26 DIAGNOSIS — N179 Acute kidney failure, unspecified: Secondary | ICD-10-CM | POA: Diagnosis present

## 2018-07-26 DIAGNOSIS — R001 Bradycardia, unspecified: Secondary | ICD-10-CM | POA: Diagnosis not present

## 2018-07-26 DIAGNOSIS — R441 Visual hallucinations: Secondary | ICD-10-CM | POA: Diagnosis not present

## 2018-07-26 DIAGNOSIS — R443 Hallucinations, unspecified: Secondary | ICD-10-CM

## 2018-07-26 DIAGNOSIS — I420 Dilated cardiomyopathy: Secondary | ICD-10-CM | POA: Diagnosis not present

## 2018-07-26 DIAGNOSIS — G4733 Obstructive sleep apnea (adult) (pediatric): Secondary | ICD-10-CM | POA: Diagnosis not present

## 2018-07-26 DIAGNOSIS — I1310 Hypertensive heart and chronic kidney disease without heart failure, with stage 1 through stage 4 chronic kidney disease, or unspecified chronic kidney disease: Secondary | ICD-10-CM | POA: Diagnosis not present

## 2018-07-26 DIAGNOSIS — N39498 Other specified urinary incontinence: Secondary | ICD-10-CM | POA: Diagnosis not present

## 2018-07-26 DIAGNOSIS — N401 Enlarged prostate with lower urinary tract symptoms: Secondary | ICD-10-CM | POA: Diagnosis not present

## 2018-07-26 DIAGNOSIS — R32 Unspecified urinary incontinence: Secondary | ICD-10-CM | POA: Diagnosis not present

## 2018-07-26 DIAGNOSIS — I129 Hypertensive chronic kidney disease with stage 1 through stage 4 chronic kidney disease, or unspecified chronic kidney disease: Secondary | ICD-10-CM | POA: Diagnosis not present

## 2018-07-26 DIAGNOSIS — Z8679 Personal history of other diseases of the circulatory system: Secondary | ICD-10-CM

## 2018-07-26 DIAGNOSIS — F419 Anxiety disorder, unspecified: Secondary | ICD-10-CM | POA: Diagnosis present

## 2018-07-26 DIAGNOSIS — N183 Chronic kidney disease, stage 3 (moderate): Secondary | ICD-10-CM | POA: Diagnosis not present

## 2018-07-26 DIAGNOSIS — Z7901 Long term (current) use of anticoagulants: Secondary | ICD-10-CM | POA: Diagnosis not present

## 2018-07-26 DIAGNOSIS — I959 Hypotension, unspecified: Secondary | ICD-10-CM | POA: Diagnosis not present

## 2018-07-26 DIAGNOSIS — I5032 Chronic diastolic (congestive) heart failure: Secondary | ICD-10-CM | POA: Diagnosis not present

## 2018-07-26 DIAGNOSIS — R338 Other retention of urine: Secondary | ICD-10-CM | POA: Diagnosis not present

## 2018-07-26 DIAGNOSIS — R404 Transient alteration of awareness: Secondary | ICD-10-CM | POA: Diagnosis not present

## 2018-07-26 DIAGNOSIS — M1990 Unspecified osteoarthritis, unspecified site: Secondary | ICD-10-CM | POA: Diagnosis not present

## 2018-07-26 DIAGNOSIS — I480 Paroxysmal atrial fibrillation: Secondary | ICD-10-CM | POA: Diagnosis present

## 2018-07-26 DIAGNOSIS — W19XXXA Unspecified fall, initial encounter: Secondary | ICD-10-CM | POA: Diagnosis not present

## 2018-07-26 DIAGNOSIS — S0083XD Contusion of other part of head, subsequent encounter: Secondary | ICD-10-CM | POA: Diagnosis not present

## 2018-07-26 DIAGNOSIS — I119 Hypertensive heart disease without heart failure: Secondary | ICD-10-CM

## 2018-07-26 DIAGNOSIS — E039 Hypothyroidism, unspecified: Secondary | ICD-10-CM | POA: Diagnosis not present

## 2018-07-26 LAB — URINALYSIS, ROUTINE W REFLEX MICROSCOPIC
BILIRUBIN URINE: NEGATIVE
Glucose, UA: NEGATIVE mg/dL
Hgb urine dipstick: NEGATIVE
Ketones, ur: NEGATIVE mg/dL
Leukocytes,Ua: NEGATIVE
NITRITE: NEGATIVE
Protein, ur: NEGATIVE mg/dL
Specific Gravity, Urine: 1.008 (ref 1.005–1.030)
pH: 7 (ref 5.0–8.0)

## 2018-07-26 LAB — COMPREHENSIVE METABOLIC PANEL
ALK PHOS: 54 U/L (ref 38–126)
ALT: 15 U/L (ref 0–44)
AST: 17 U/L (ref 15–41)
Albumin: 3.3 g/dL — ABNORMAL LOW (ref 3.5–5.0)
Anion gap: 8 (ref 5–15)
BUN: 43 mg/dL — ABNORMAL HIGH (ref 8–23)
CALCIUM: 8.7 mg/dL — AB (ref 8.9–10.3)
CO2: 28 mmol/L (ref 22–32)
Chloride: 106 mmol/L (ref 98–111)
Creatinine, Ser: 1.73 mg/dL — ABNORMAL HIGH (ref 0.61–1.24)
GFR calc Af Amer: 44 mL/min — ABNORMAL LOW (ref >60)
GFR calc non Af Amer: 38 mL/min — ABNORMAL LOW (ref >60)
Glucose, Bld: 99 mg/dL (ref 70–99)
Potassium: 4.3 mmol/L (ref 3.5–5.1)
Sodium: 142 mmol/L (ref 135–145)
Total Bilirubin: 0.9 mg/dL (ref 0.3–1.2)
Total Protein: 5.9 g/dL — ABNORMAL LOW (ref 6.5–8.1)

## 2018-07-26 LAB — CBC WITH DIFFERENTIAL/PLATELET
Abs Immature Granulocytes: 0.12 10*3/uL — ABNORMAL HIGH (ref 0.00–0.07)
Basophils Absolute: 0 10*3/uL (ref 0.0–0.1)
Basophils Relative: 1 %
EOS PCT: 1 %
Eosinophils Absolute: 0.1 10*3/uL (ref 0.0–0.5)
HCT: 35.2 % — ABNORMAL LOW (ref 39.0–52.0)
HEMOGLOBIN: 11 g/dL — AB (ref 13.0–17.0)
Immature Granulocytes: 2 %
LYMPHS PCT: 18 %
Lymphs Abs: 1.3 10*3/uL (ref 0.7–4.0)
MCH: 29 pg (ref 26.0–34.0)
MCHC: 31.3 g/dL (ref 30.0–36.0)
MCV: 92.9 fL (ref 80.0–100.0)
Monocytes Absolute: 0.8 10*3/uL (ref 0.1–1.0)
Monocytes Relative: 11 %
Neutro Abs: 5.1 10*3/uL (ref 1.7–7.7)
Neutrophils Relative %: 67 %
Platelets: 287 10*3/uL (ref 150–400)
RBC: 3.79 MIL/uL — ABNORMAL LOW (ref 4.22–5.81)
RDW: 14.7 % (ref 11.5–15.5)
WBC: 7.5 10*3/uL (ref 4.0–10.5)
nRBC: 0 % (ref 0.0–0.2)

## 2018-07-26 LAB — CBG MONITORING, ED
Glucose-Capillary: 47 mg/dL — ABNORMAL LOW (ref 70–99)
Glucose-Capillary: 90 mg/dL (ref 70–99)

## 2018-07-26 LAB — POCT I-STAT EG7
Acid-Base Excess: 4 mmol/L — ABNORMAL HIGH (ref 0.0–2.0)
Bicarbonate: 30.8 mmol/L — ABNORMAL HIGH (ref 20.0–28.0)
Calcium, Ion: 1.23 mmol/L (ref 1.15–1.40)
HCT: 33 % — ABNORMAL LOW (ref 39.0–52.0)
Hemoglobin: 11.2 g/dL — ABNORMAL LOW (ref 13.0–17.0)
O2 Saturation: 23 %
PH VEN: 7.354 (ref 7.250–7.430)
Potassium: 4.1 mmol/L (ref 3.5–5.1)
Sodium: 142 mmol/L (ref 135–145)
TCO2: 32 mmol/L (ref 22–32)
pCO2, Ven: 55.3 mmHg (ref 44.0–60.0)
pO2, Ven: 18 mmHg — CL (ref 32.0–45.0)

## 2018-07-26 LAB — ETHANOL: Alcohol, Ethyl (B): <10 mg/dL (ref <10)

## 2018-07-26 LAB — AMMONIA: Ammonia: 10 umol/L (ref 9–35)

## 2018-07-26 MED ORDER — SODIUM CHLORIDE 0.9 % IV SOLN: 250.0000 mL | INTRAVENOUS | Status: DC | PRN

## 2018-07-26 MED ORDER — SENNOSIDES-DOCUSATE SODIUM 8.6-50 MG PO TABS
1.0000 | ORAL_TABLET | Freq: Every evening | ORAL | Status: DC | PRN
  Administered 2018-08-21: 1 via ORAL
  Filled 2018-07-26: qty 1

## 2018-07-26 MED ORDER — SODIUM CHLORIDE 0.9 % IV BOLUS
1000.0000 mL | Freq: Once | INTRAVENOUS | Status: AC
  Administered 2018-07-26: 1000 mL via INTRAVENOUS

## 2018-07-26 MED ORDER — ONDANSETRON HCL 4 MG/2ML IJ SOLN: 4.0000 mg | Freq: Four times a day (QID) | INTRAMUSCULAR | Status: DC | PRN

## 2018-07-26 MED ORDER — APIXABAN 5 MG PO TABS
5.0000 mg | ORAL_TABLET | Freq: Two times a day (BID) | ORAL | Status: DC
  Administered 2018-07-26 – 2018-08-23 (×56): 5 mg via ORAL
  Filled 2018-07-26 (×56): qty 1

## 2018-07-26 MED ORDER — IPRATROPIUM-ALBUTEROL 0.5-2.5 (3) MG/3ML IN SOLN
3.0000 mL | Freq: Four times a day (QID) | RESPIRATORY_TRACT | Status: DC | PRN
  Filled 2018-07-26: qty 3

## 2018-07-26 MED ORDER — ISOSORBIDE MONONITRATE ER 60 MG PO TB24
60.0000 mg | ORAL_TABLET | Freq: Every day | ORAL | Status: DC
  Administered 2018-07-27 – 2018-08-23 (×28): 60 mg via ORAL
  Filled 2018-07-26: qty 2
  Filled 2018-07-26: qty 1
  Filled 2018-07-26 (×3): qty 2
  Filled 2018-07-26: qty 1
  Filled 2018-07-26: qty 2
  Filled 2018-07-26: qty 1
  Filled 2018-07-26 (×11): qty 2
  Filled 2018-07-26 (×4): qty 1
  Filled 2018-07-26 (×4): qty 2
  Filled 2018-07-26: qty 1

## 2018-07-26 MED ORDER — RAMELTEON 8 MG PO TABS
8.0000 mg | ORAL_TABLET | Freq: Every evening | ORAL | Status: DC | PRN
  Administered 2018-08-04: 8 mg via ORAL
  Filled 2018-07-26 (×3): qty 1

## 2018-07-26 MED ORDER — DICLOFENAC SODIUM 1 % TD GEL
2.0000 g | Freq: Four times a day (QID) | TRANSDERMAL | Status: DC
  Administered 2018-07-26 – 2018-08-23 (×99): 2 g via TOPICAL
  Filled 2018-07-26 (×3): qty 100

## 2018-07-26 MED ORDER — ACETAMINOPHEN 325 MG PO TABS
650.0000 mg | ORAL_TABLET | Freq: Four times a day (QID) | ORAL | Status: DC | PRN
  Administered 2018-08-01 – 2018-08-03 (×3): 650 mg via ORAL
  Filled 2018-07-26 (×3): qty 2

## 2018-07-26 MED ORDER — AMIODARONE HCL 100 MG PO TABS
200.0000 mg | ORAL_TABLET | Freq: Every day | ORAL | Status: DC
  Administered 2018-07-27 – 2018-08-23 (×27): 200 mg via ORAL
  Filled 2018-07-26 (×4): qty 1
  Filled 2018-07-26: qty 2
  Filled 2018-07-26: qty 1
  Filled 2018-07-26: qty 2
  Filled 2018-07-26 (×4): qty 1
  Filled 2018-07-26: qty 2
  Filled 2018-07-26 (×4): qty 1
  Filled 2018-07-26: qty 2
  Filled 2018-07-26: qty 1
  Filled 2018-07-26: qty 2
  Filled 2018-07-26 (×5): qty 1
  Filled 2018-07-26: qty 2
  Filled 2018-07-26: qty 1
  Filled 2018-07-26 (×2): qty 2

## 2018-07-26 MED ORDER — SODIUM CHLORIDE 0.9% FLUSH
3.0000 mL | INTRAVENOUS | Status: DC | PRN
  Administered 2018-07-27 – 2018-07-31 (×2): 3 mL via INTRAVENOUS
  Filled 2018-07-26 (×2): qty 3

## 2018-07-26 MED ORDER — MOMETASONE FURO-FORMOTEROL FUM 200-5 MCG/ACT IN AERO
2.0000 | INHALATION_SPRAY | Freq: Two times a day (BID) | RESPIRATORY_TRACT | Status: DC
  Administered 2018-07-27 – 2018-08-23 (×52): 2 via RESPIRATORY_TRACT
  Filled 2018-07-26 (×2): qty 8.8

## 2018-07-26 MED ORDER — ALLOPURINOL 100 MG PO TABS
100.0000 mg | ORAL_TABLET | Freq: Every day | ORAL | Status: DC
  Administered 2018-07-27 – 2018-08-23 (×28): 100 mg via ORAL
  Filled 2018-07-26 (×28): qty 1

## 2018-07-26 MED ORDER — SODIUM CHLORIDE 0.9 % IV SOLN
INTRAVENOUS | Status: AC
  Administered 2018-07-26 – 2018-07-27 (×2): via INTRAVENOUS

## 2018-07-26 MED ORDER — SODIUM CHLORIDE 0.9% FLUSH
3.0000 mL | Freq: Two times a day (BID) | INTRAVENOUS | Status: DC
  Administered 2018-07-27 – 2018-08-23 (×42): 3 mL via INTRAVENOUS

## 2018-07-26 MED ORDER — ACETAMINOPHEN 650 MG RE SUPP: 650.0000 mg | Freq: Four times a day (QID) | RECTAL | Status: DC | PRN

## 2018-07-26 MED ORDER — ONDANSETRON HCL 4 MG PO TABS: 4.0000 mg | ORAL_TABLET | Freq: Four times a day (QID) | ORAL | Status: DC | PRN

## 2018-07-26 MED ORDER — LEVOTHYROXINE SODIUM 25 MCG PO TABS
125.0000 ug | ORAL_TABLET | Freq: Every day | ORAL | Status: DC
  Administered 2018-07-27: 125 ug via ORAL
  Filled 2018-07-26: qty 1

## 2018-07-26 MED ORDER — FENOFIBRATE 160 MG PO TABS
160.0000 mg | ORAL_TABLET | Freq: Every day | ORAL | Status: DC
  Administered 2018-07-27 – 2018-08-23 (×28): 160 mg via ORAL
  Filled 2018-07-26 (×28): qty 1

## 2018-07-26 NOTE — ED Notes (Signed)
Called 5W to give report.  Receiving RN unable to take report at this time. 
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
## 2018-07-26 NOTE — ED Provider Notes (Signed)
[facility] EMERGENCY DEPARTMENT Provider Note   CSN: [id number] Arrival date & time: 07/26/18  1108    History   Chief Complaint Chief Complaint  Patient presents with  . Altered Mental Status    HPI Cody Faulkner is a 73 y.o. male.     73 yo M with a chief complaint of auditory hallucinations.  This been going on for the past 2 or 3 days.  They have been telling him to kill him his dog and his family.  He has never had this problem before.  Denies a history of psychosis.  The patient denies cough congestion fever denies nausea vomiting or diarrhea.  The history is provided by the patient.  Altered Mental Status  Presenting symptoms: no confusion   Associated symptoms: no abdominal pain, no fever, no headaches, no palpitations, no rash and no vomiting   Illness  Severity:  Moderate Onset quality:  Sudden Duration:  2 hours Timing:  Constant Progression:  Worsening Chronicity:  New Associated symptoms: no abdominal pain, no chest pain, no congestion, no diarrhea, no fever, no headaches, no myalgias, no rash, no shortness of breath and no vomiting     Past Medical History:  Diagnosis Date  . Asthma    Albuterol prn;Symbicort daily  . Atrial fibrillation ([place])    takes COumadin daily  . Bruises easily    d/t being on Coumadin  . CAD (coronary artery disease)    predominantly single vessel  . Cardiomyopathy, ischemic   . Cataracts, bilateral to be removed in Aug 2015  . Chronic kidney disease 1976   S/P nephrectomy  . Constipation    takes Colace daily as needed  . Depression   . Dilated cardiomyopathy ([place])    s/p defibrillator [employer] [phone]  . Dizziness    occasionally  . GERD (gastroesophageal reflux disease)    takes OTC med if needed  . Gout    takes Allopurinol daily  . Heart failure ([place])    NYHFA     CLASS 3  . History of hyperthyroidism   . Hyperlipidemia    takes Fenofibrate daily  . Hypertension    takes Bisoprolol  daily as well as Imdur  . Hypothyroidism    takes Synthroid daily  . Joint pain   . Joint swelling   . Left knee DJD    needs surgery  . Myocardial infarction ([place]) 2007  . OSA (obstructive sleep apnea)    "don't wear mask; can't afford one" (12/20/2013)  . Pacemaker   . Peripheral edema    takes Furosemide daily  . Pneumonia, community acquired last time 2014   "get it ~ q yr; haven't had it yet in 2015" (12/20/2013)  . Seasonal allergies    takes Claritin daily  . Shortness of breath    with exertion  . Solitary kidney 06/05/2006  . Urinary frequency   . Urinary incontinence   . Urinary urgency   . Ventricular tachycardia [facility])    s/p defibrillator-[employer] [phone]    Patient Active Problem List   Diagnosis Date Noted  . Chronic gout of left knee   . Unsteady gait   . Primary osteoarthritis of left knee   . HTN (hypertension), benign   . Weakness 07/12/2018  . Delirium 05/30/2018  . On amiodarone therapy 05/18/2018  . Chronic anticoagulation- Coumadin 12/23/2013  . Sinus node dysfunction ([place]) 12/21/2013  . Paroxysmal ventricular tachycardia ([place]) 12/21/2013  . ICD (implantable cardioverter-defibrillator) lead  failure 12/20/2013  . Obesity hypoventilation syndrome ([place]) 12/07/2012  . History of hyperthyroidism   . CKD (chronic kidney disease) stage 3, GFR 30-59 ml/min (HCC) 06/23/2011  . Morbid obesity   . Long term current use of anticoagulant therapy   . OSA (obstructive sleep apnea)   . Hypertensive heart disease with heart failure ([place])   . Solitary kidney   . Community acquired pneumonia 06/06/2011  . Acute encephalopathy 06/06/2011  . Hypothyroidism   . Mild persistent asthma   . PAF (paroxysmal atrial fibrillation) ([place])   . ICD (implantable cardioverter-defibrillator) in place   . History of dilated cardiomyopathy- last EF 50-55% 2D July 2014   . Chronic combined systolic and diastolic heart failure ([place])   . CAD nonobstructive- 2007 11/12/2005     Past Surgical History:  Procedure Laterality Date  . CARDIAC CATHETERIZATION  2007   60% Stenosis mid-CFX  . CARDIAC DEFIBRILLATOR PLACEMENT  2007   Medtronic EnTrust [phone]  . CARDIAC DEFIBRILLATOR REMOVAL  12/20/2013  . CARDIOVERSION  06/18/2011   Procedure: CARDIOVERSION;  Surgeon: Kerry Hough., MD;  Location: [facility];  Service: Cardiovascular;  Laterality: N/A;  . CARDIOVERSION N/A 12/06/2012   Procedure: CARDIOVERSION;  Surgeon: Jacolyn Reedy, MD;  Location: [facility];  Service: Cardiovascular;  Laterality: N/A;  . COLONOSCOPY    . ICD GENERATOR CHANGE  07/2012  . ICD LEAD REMOVAL Left 12/20/2013   Procedure: ICD LEAD REMOVAL//EXTRACTION AND PLACEMENT OF TEMP/PERM ATRIAL LEAD;  Surgeon: Evans Lance, MD;  Location: [facility];  Service: Cardiovascular;  Laterality: Left;  . IMPLANTABLE CARDIOVERTER DEFIBRILLATOR IMPLANT N/A 01/25/2014   Procedure: IMPLANTABLE CARDIOVERTER DEFIBRILLATOR IMPLANT;  Surgeon: Evans Lance, MD;  Location: [facility] CATH LAB;  Service: Cardiovascular;  Laterality: N/A;  . IMPLANTABLE CARDIOVERTER DEFIBRILLATOR REVISION N/A 07/13/2012   Procedure: IMPLANTABLE CARDIOVERTER DEFIBRILLATOR REVISION;  Surgeon: Deboraha Sprang, MD;  Location: [facility] CATH LAB;  Service: Cardiovascular;  Laterality: N/A;  . INSERT / REPLACE / REMOVE PACEMAKER  12/20/2013  . KNEE ARTHROSCOPY Left   . MULTIPLE TOOTH EXTRACTIONS     "top's are all gone; took some off the bottom too"  . NEPHRECTOMY  1976   Right  . TEE WITHOUT CARDIOVERSION  07/13/2012   Procedure: TRANSESOPHAGEAL ECHOCARDIOGRAM (TEE);  Surgeon: Lelon Perla, MD;  Location: [facility] ENDOSCOPY;  Service: Cardiovascular;  Laterality: N/A;        Home Medications    Prior to Admission medications   Medication Sig Start Date End Date Taking? Authorizing Provider  acetaminophen (TYLENOL) 500 MG tablet Take 500 mg by mouth every 6 (six) hours as needed for moderate pain.     [provider]  albuterol (PROVENTIL) (2.5  MG/3ML) 0.083% nebulizer solution Inhale 3 mLs (2.5 mg total) into the lungs every 4 (four) hours as needed for shortness of breath. 07/15/18   [name], MD  allopurinol (ZYLOPRIM) 100 MG tablet Take 100 mg by mouth daily.  10/28/13   [provider]  amiodarone (PACERONE) 200 MG tablet Take 1 tablet (200 mg total) by mouth daily. 07/30/17   Evans Lance, MD  budesonide-formoterol [facility]) 160-4.5 MCG/ACT inhaler Inhale 2 puffs into the lungs 2 (two) times daily.    [provider]  Cholecalciferol (VITAMIN D) 2000 UNITS tablet Take 2,000 Units by mouth daily.    [provider]  clonazePAM (KLONOPIN) 0.5 MG tablet Take 0.25-0.5 mg by mouth See admin instructions. Take 1 tablet by mouth at bedtime for  sleep and 1/2 tablet in the morning for anxiety 07/06/18   [provider]  diclofenac sodium (VOLTAREN) 1 % GEL Apply 2 g topically 4 (four) times daily. 07/15/18   [name], MD  ELIQUIS 5 MG TABS tablet TAKE 1 TABLET BY MOUTH TWICE A DAY Patient taking differently: Take 5 mg by mouth 2 (two) times daily.  06/30/18   Evans Lance, MD  fenofibrate 160 MG tablet Take 160 mg by mouth daily.    [provider]  furosemide (LASIX) 80 MG tablet Take 1 tablet (80 mg total) by mouth daily. 05/18/18   Richardo Priest, MD  hydrALAZINE (APRESOLINE) 25 MG tablet Take 1 tablet (25 mg total) by mouth 2 (two) times daily. 07/30/17   Evans Lance, MD  isosorbide mononitrate (IMDUR) 60 MG 24 hr tablet TAKE 1 TABLET BY MOUTH DAILY 07/18/18   Evans Lance, MD  loratadine (CLARITIN) 10 MG tablet Take 10 mg by mouth every morning.     [provider]  losartan (COZAAR) 100 MG tablet Take 100 mg by mouth daily.    [provider]  potassium chloride SA (K-DUR,KLOR-CON) 20 MEQ tablet Take 1 tablet (20 mEq total) by mouth every other day. 07/16/17   Evans Lance, MD  SYNTHROID 125 MCG tablet Take 125 mcg by mouth daily. 01/29/18   [provider]  triamcinolone cream (KENALOG) 0.1 % Apply 1 application topically as needed (on affected area(s)on skin).  05/04/18   [provider]    Family History Family History  Problem Relation Age of Onset  . Heart disease Other        uncle  . Diabetes Other        uncle    Social History Social History   Tobacco Use  . Smoking status: Never Smoker  . Smokeless tobacco: Never Used  Substance Use Topics  . Alcohol use: Yes    Comment: 12/20/2013 "might have drank a couple beers before; never really drank"  . Drug use: No     Allergies   Methimazole [methimazole]   Review of Systems Review of Systems  Constitutional: Positive for activity change. Negative for chills and fever.  HENT: Negative for congestion and facial swelling.   Eyes: Negative for discharge and visual disturbance.  Respiratory: Negative for shortness of breath.   Cardiovascular: Negative for chest pain and palpitations.  Gastrointestinal: Negative for abdominal pain, diarrhea and vomiting.  Musculoskeletal: Negative for arthralgias and myalgias.  Skin: Negative for color change and rash.  Neurological: Negative for tremors, syncope and headaches.  Psychiatric/Behavioral: Negative for confusion and dysphoric mood.     Physical Exam Updated Vital Signs BP 107/68   Pulse (!) 50   Temp 97.8 F (36.6 C) (Oral)   Resp 15   Ht 5\' 5"  (1.651 m)   Wt 90.7 kg   SpO2 97%   BMI 33.28 kg/m   Physical Exam Vitals signs and nursing note reviewed.  Constitutional:      Appearance: He is well-developed. He is obese.  HENT:     Head: Normocephalic.     Comments: Hematoma to the left frontal region bruising to the left side of his face Eyes:     Pupils: Pupils are equal, round, and reactive to light.  Neck:     Musculoskeletal: Normal range of motion and neck supple.     Vascular: No JVD.  Cardiovascular:     Rate and Rhythm: Normal rate and  regular rhythm.     Heart sounds: No  murmur. No friction rub. No gallop.   Pulmonary:     Effort: No respiratory distress.     Breath sounds: No wheezing.  Abdominal:     General: There is no distension.     Tenderness: There is no guarding or rebound.  Musculoskeletal: Normal range of motion.  Skin:    Coloration: Skin is not pale.     Findings: No rash.  Neurological:     Mental Status: He is alert and oriented to person, place, and time.  Psychiatric:        Behavior: Behavior normal.      ED Treatments / Results  Labs (all labs ordered are listed, but only abnormal results are displayed) Labs Reviewed  COMPREHENSIVE METABOLIC PANEL - Abnormal; Notable for the following components:      Result Value   BUN 43 (*)    Creatinine, Ser 1.73 (*)    Calcium 8.7 (*)    Total Protein 5.9 (*)    Albumin 3.3 (*)    GFR calc non Af Amer 38 (*)    GFR calc Af Amer 44 (*)    All other components within normal limits  CBC WITH DIFFERENTIAL/PLATELET - Abnormal; Notable for the following components:   RBC 3.79 (*)    Hemoglobin 11.0 (*)    HCT 35.2 (*)    Abs Immature Granulocytes 0.12 (*)    All other components within normal limits  URINALYSIS, ROUTINE W REFLEX MICROSCOPIC - Abnormal; Notable for the following components:   Color, Urine STRAW (*)    All other components within normal limits  CBG MONITORING, ED - Abnormal; Notable for the following components:   Glucose-Capillary 47 (*)    All other components within normal limits  POCT I-STAT EG7 - Abnormal; Notable for the following components:   pO2, Ven 18.0 (*)    Bicarbonate 30.8 (*)    Acid-Base Excess 4.0 (*)    HCT 33.0 (*)    Hemoglobin 11.2 (*)    All other components within normal limits  URINE CULTURE  AMMONIA  ETHANOL  I-STAT VENOUS BLOOD GAS, ED  CBG MONITORING, ED    EKG EKG Interpretation  Date/Time:  Tuesday July 26 2018 11:17:25 EST Ventricular Rate:  73 PR Interval:    QRS Duration: 118 QT Interval:  442 QTC  Calculation: 411 R Axis:   -102 Text Interpretation:  Sinus rhythm Ventricular premature complex Short PR interval Incomplete RBBB and LAFB ST elevation, consider inferior injury No significant change since last tracing Confirmed by Deno Etienne [phone]) on 07/26/2018 12:08:10 PM   Radiology Dg Chest 2 View  Result Date: 07/26/2018 CLINICAL DATA:  Altered level of consciousness. EXAM: CHEST - 2 VIEW COMPARISON:  07/12/2018 FINDINGS: The dual lead pacer/AICD unchanged. Heart size upper normal. Negative for heart failure. Lungs remain clear without infiltrate or effusion. No significant interval change. IMPRESSION: No active cardiopulmonary disease. Electronically Signed   By: Franchot Gallo M.D.   On: 07/26/2018 12:50   Ct Head Wo Contrast  Result Date: 07/26/2018 CLINICAL DATA:  73 year old male with altered mental status. Fell 2 weeks ago. Subsequent encounter. EXAM: CT HEAD WITHOUT CONTRAST TECHNIQUE: Contiguous axial images were obtained from the base of the skull through the vertex without intravenous contrast. COMPARISON:  07/12/2018 and 07/08/2018 CT. FINDINGS: Brain: No intracranial hemorrhage or CT evidence of large acute infarct. Chronic microvascular changes. Global atrophy. No intracranial mass lesion noted on this  unenhanced exam. Vascular: Vascular calcifications without acute hyperdense vessel. Skull: No skull fracture. Sinuses/Orbits: Post lens replacement. Globes appear intact. Minimal polypoid mucosal thickening maxillary sinuses. Mastoid air cells and middle ear cavities are clear. Other: Left orbital/supraorbital hematoma slightly smaller. IMPRESSION: 1. No skull fracture or intracranial hemorrhage. 2. No CT evidence of large acute infarct. 3. Chronic microvascular changes. 4. Global atrophy. 5. Slight decrease in size of left orbital/supraorbital hematoma. Electronically Signed   By: Genia Del M.D.   On: 07/26/2018 14:19    Procedures Procedures (including critical care  time)  Medications Ordered in ED Medications  sodium chloride 0.9 % bolus 1,000 mL (0 mLs Intravenous Stopped 07/26/18 1236)     Initial Impression / Assessment and Plan / ED Course  I have reviewed the triage vital signs and the nursing notes.  Pertinent labs & imaging results that were available during my care of the patient were reviewed by me and considered in my medical decision making (see chart for details).        73 yo M with a chief complaint of auditory hallucinations.  This is new for the patient.  Screen the patient for infection UA was negative chest x-ray negative for pneumonia as viewed by me.  No concerning electrolyte abnormality not hypercarbic CT the head was negative for acute intracranial pathology.  He did have a transient blood sugar that was in the 40s.  I wonder if this is part of the cause.  Discussed the case with internal medicine residency service they will evaluate the patient for admission.  CRITICAL CARE Performed by: Cecilio Asper   Total critical care time: 35 minutes  Critical care time was exclusive of separately billable procedures and treating other patients.  Critical care was necessary to treat or prevent imminent or life-threatening deterioration.  Critical care was time spent personally by me on the following activities: development of treatment plan with patient and/or surrogate as well as nursing, discussions with consultants, evaluation of patient's response to treatment, examination of patient, obtaining history from patient or surrogate, ordering and performing treatments and interventions, ordering and review of laboratory studies, ordering and review of radiographic studies, pulse oximetry and re-evaluation of patient's condition.  The patients results and plan were reviewed and discussed.   Any x-rays performed were independently reviewed by myself.   Differential diagnosis were considered with the presenting  HPI.  Medications  sodium chloride 0.9 % bolus 1,000 mL (0 mLs Intravenous Stopped 07/26/18 1236)    Vitals:   07/26/18 1111 07/26/18 1114 07/26/18 1117 07/26/18 1145  BP:   (!) 109/54 107/68  Pulse:   (!) 53 (!) 50  Resp:   16 15  Temp:   97.8 F (36.6 C)   TempSrc:   Oral   SpO2: 95%  99% 97%  Weight:  90.7 kg    Height:  5\' 5"  (1.651 m)      Final diagnoses:  Transient alteration of awareness  Hypoglycemia    Admission/ observation were discussed with the admitting physician, patient and/or family and they are comfortable with the plan.   Final Clinical Impressions(s) / ED Diagnoses   Final diagnoses:  Transient alteration of awareness  Hypoglycemia    ED Discharge Orders    None       Deno Etienne, DO 07/26/18 1524

## 2018-07-26 NOTE — ED Triage Notes (Addendum)
Pt tot ED from [facility] with ams since Sunday, talking about killing his dog and family members that are already dead. Fall two weeks ago with hematoma above left eye. Strong smell of urine per EMS. A/O x 4. Pt endorses hearing voices for the last few days that are telling him to kill himself and his family.

## 2018-07-26 NOTE — ED Notes (Signed)
Patient transported to CT 

## 2018-07-26 NOTE — ED Notes (Signed)
Got patient undress on the monitor did ekg shown to Dr Tegeler patient is resting with call bell in reach 

## 2018-07-26 NOTE — ED Notes (Signed)
First CBG of 47 from diluted sample. Redrawn for more accurate sample.

## 2018-07-27 DIAGNOSIS — R4182 Altered mental status, unspecified: Secondary | ICD-10-CM

## 2018-07-27 DIAGNOSIS — E538 Deficiency of other specified B group vitamins: Secondary | ICD-10-CM

## 2018-07-27 DIAGNOSIS — R440 Auditory hallucinations: Secondary | ICD-10-CM

## 2018-07-27 DIAGNOSIS — E039 Hypothyroidism, unspecified: Secondary | ICD-10-CM

## 2018-07-27 DIAGNOSIS — F29 Unspecified psychosis not due to a substance or known physiological condition: Secondary | ICD-10-CM | POA: Diagnosis present

## 2018-07-27 DIAGNOSIS — R441 Visual hallucinations: Secondary | ICD-10-CM

## 2018-07-27 DIAGNOSIS — R001 Bradycardia, unspecified: Secondary | ICD-10-CM

## 2018-07-27 LAB — BASIC METABOLIC PANEL
Anion gap: 8 (ref 5–15)
BUN: 38 mg/dL — ABNORMAL HIGH (ref 8–23)
CO2: 24 mmol/L (ref 22–32)
Calcium: 8.6 mg/dL — ABNORMAL LOW (ref 8.9–10.3)
Chloride: 109 mmol/L (ref 98–111)
Creatinine, Ser: 1.49 mg/dL — ABNORMAL HIGH (ref 0.61–1.24)
GFR calc Af Amer: 53 mL/min — ABNORMAL LOW (ref >60)
GFR calc non Af Amer: 46 mL/min — ABNORMAL LOW (ref >60)
Glucose, Bld: 89 mg/dL (ref 70–99)
Potassium: 3.8 mmol/L (ref 3.5–5.1)
Sodium: 141 mmol/L (ref 135–145)

## 2018-07-27 LAB — URINE CULTURE: Culture: NO GROWTH

## 2018-07-27 LAB — VITAMIN B12: Vitamin B-12: 77 pg/mL — ABNORMAL LOW (ref 180–914)

## 2018-07-27 LAB — T4, FREE: Free T4: 1.13 ng/dL (ref 0.82–1.77)

## 2018-07-27 LAB — MRSA PCR SCREENING: MRSA by PCR: NEGATIVE

## 2018-07-27 MED ORDER — VITAMIN B-12 1000 MCG PO TABS
1000.0000 ug | ORAL_TABLET | Freq: Every day | ORAL | Status: DC
  Administered 2018-07-27: 1000 ug via ORAL
  Filled 2018-07-27: qty 1

## 2018-07-27 MED ORDER — RISPERIDONE 0.5 MG PO TABS
0.5000 mg | ORAL_TABLET | Freq: Two times a day (BID) | ORAL | Status: DC
  Administered 2018-07-27 – 2018-08-10 (×29): 0.5 mg via ORAL
  Filled 2018-07-27 (×29): qty 1

## 2018-07-27 MED ORDER — VITAMIN B-12 1000 MCG PO TABS
1000.0000 ug | ORAL_TABLET | Freq: Once | ORAL | Status: AC
  Administered 2018-07-27: 1000 ug via ORAL

## 2018-07-27 MED ORDER — PANTOPRAZOLE SODIUM 40 MG PO TBEC
40.0000 mg | DELAYED_RELEASE_TABLET | Freq: Every day | ORAL | Status: DC
  Administered 2018-07-27 – 2018-08-23 (×28): 40 mg via ORAL
  Filled 2018-07-27 (×28): qty 1

## 2018-07-27 MED ORDER — VITAMIN B-12 1000 MCG PO TABS
2000.0000 ug | ORAL_TABLET | Freq: Every day | ORAL | Status: DC
  Administered 2018-07-28 – 2018-08-01 (×5): 2000 ug via ORAL
  Filled 2018-07-27 (×5): qty 2

## 2018-07-27 MED ORDER — LEVOTHYROXINE SODIUM 75 MCG PO TABS
150.0000 ug | ORAL_TABLET | Freq: Every day | ORAL | Status: DC
  Administered 2018-07-28 – 2018-08-23 (×27): 150 ug via ORAL
  Filled 2018-07-27 (×29): qty 2
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
## 2018-07-27 NOTE — Care Management Note (Addendum)
Case Management Note  Patient Details  Name: Cody Faulkner MRN: [id number] Date of Birth: [birth year]  Subjective/Objective:   Admitted with auditory hallucinations. Hx of PAF / eliquis, dilated cardiomyopathy s/p AICD, htn, hld, copd, CKD3, osa, gout and DJD. From [facility]. Recent admit  2/4-2/7, recent falls 2/2 DJD.    Butler Denmark (Relative) Ezequiel Ganser (Relative) Jaeven Wanzer  (son)   [phone]     PCP: Cyndi Bender  Action/Plan: Per Psych MD PT warrants inpatient psych hospitalization 2/2 to active psychosis. CSW made aware per NCM. CSW to manage disposition to psych hospital... per hospital  MD pt medically ready.  Expected Discharge Date:                  Expected Discharge Plan:  Psychiatric Hospital  In-House Referral:  Clinical Social Work  Discharge planning Services  CM Consult  Post Acute Care Choice:   n/a Choice offered to:   n/a  DME Arranged:   n/a DME Agency:   n/a  HH Arranged:   n/a HH Agency:   n/a  Status of Service:  completed  If discussed at Long Length of Stay Meetings, dates discussed:    Additional Comments:  Sharin Mons, RN 07/27/2018, 3:46 PM

## 2018-07-27 NOTE — Discharge Instructions (Signed)

## 2018-07-27 NOTE — Progress Notes (Signed)
Patient's heart is running < 50 asymptomatic. Notified on call internal service provider Rehman. Will continue monitor the patient.

## 2018-07-27 NOTE — Progress Notes (Signed)
CSW notes recommendation of inpatient psych placement. Patient will require geropsych bed.   Percell Locus Thoren Hosang LCSW [phone]

## 2018-07-27 NOTE — Progress Notes (Signed)
Patient received from ED via bed. Patient is alert and oriented x3.vital signs are stable.no acute distress noted. Iv in place and running fluid. Skin assessment done with another nurse. Patient given instructions about call bell and phone. Bed in low position and call bell in reach.
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
## 2018-07-28 LAB — INTRINSIC FACTOR ANTIBODIES: Intrinsic Factor: 1.1 AU/mL (ref 0.0–1.1)

## 2018-07-28 MED ORDER — INFLUENZA VAC SPLIT HIGH-DOSE 0.5 ML IM SUSY
0.5000 mL | PREFILLED_SYRINGE | INTRAMUSCULAR | Status: DC
  Filled 2018-07-28: qty 0.5

## 2018-07-28 NOTE — Plan of Care (Signed)
Patient experiences some confusion during the night time, but is easily redirectable. Problem: Clinical Measurements: Goal: Will remain free from infection Outcome: Progressing Goal: Respiratory complications will improve Outcome: Progressing Goal: Cardiovascular complication will be avoided Outcome: Progressing   Problem: Nutrition: Goal: Adequate nutrition will be maintained Outcome: Progressing   Problem: Coping: Goal: Level of anxiety will decrease Outcome: Progressing   Problem: Pain Managment: Goal: General experience of comfort will improve Outcome: Progressing   Problem: Safety: Goal: Ability to remain free from injury will improve Outcome: Progressing   Problem: Education: Goal: Knowledge of General Education information will improve Description Including pain rating scale, medication(s)/side effects and non-pharmacologic comfort measures Outcome: Not Progressing   Problem: Health Behavior/Discharge Planning: Goal: Ability to manage health-related needs will improve Outcome: Not Progressing   Problem: Clinical Measurements: Goal: Ability to maintain clinical measurements within normal limits will improve Outcome: Not Progressing Goal: Diagnostic test results will improve Outcome: Not Progressing   Problem: Activity: Goal: Risk for activity intolerance will decrease Outcome: Not Progressing   Problem: Elimination: Goal: Will not experience complications related to bowel motility Outcome: Not Progressing Goal: Will not experience complications related to urinary retention Outcome: Not Progressing   Problem: Skin Integrity: Goal: Risk for impaired skin integrity will decrease Outcome: Not Progressing
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
## 2018-07-28 NOTE — Clinical Social Work Note (Addendum)
[facility] referral faxed to [facility] and [facility]--fax confirmation received.   [place], [place]
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
## 2018-07-29 ENCOUNTER — Encounter (HOSPITAL_COMMUNITY): Payer: Self-pay | Admitting: *Deleted

## 2018-07-29 LAB — BASIC METABOLIC PANEL
Anion gap: 7 (ref 5–15)
BUN: 26 mg/dL — ABNORMAL HIGH (ref 8–23)
CO2: 22 mmol/L (ref 22–32)
Calcium: 8.8 mg/dL — ABNORMAL LOW (ref 8.9–10.3)
Chloride: 109 mmol/L (ref 98–111)
Creatinine, Ser: 1.43 mg/dL — ABNORMAL HIGH (ref 0.61–1.24)
GFR calc Af Amer: 56 mL/min — ABNORMAL LOW (ref >60)
GFR calc non Af Amer: 48 mL/min — ABNORMAL LOW (ref >60)
Glucose, Bld: 88 mg/dL (ref 70–99)
Potassium: 3.7 mmol/L (ref 3.5–5.1)
Sodium: 138 mmol/L (ref 135–145)

## 2018-07-29 NOTE — Progress Notes (Signed)
CSW initiated IVC for patient (on shadow chart)-due to hallucinations, geropsych requires an IVC. Will continue to search for bed placement.   Percell Locus Johan Creveling LCSW [phone]

## 2018-07-29 NOTE — Evaluation (Signed)
Physical Therapy Evaluation Patient Details Name: Cody Faulkner MRN: [id number] DOB: 1945-09-14 Today's Date: 07/29/2018   History of Present Illness  73 year old man with a history of CAD and ICD/defibrillator, paroxysmal A. fib, CKD 3, gout, and hypothyroidism admitted from his nursing home with hallucinations.  Clinical Impression  Pt admitted with/for hallucination.  Pt also suffered a fall and is mildly unsteady overall at this time, needing minimal assist for basic mobility.  Pt currently limited functionally due to the problems listed. ( See problems list.)   Pt will benefit from PT to maximize function and safety in order to get ready for next venue listed below.     Follow Up Recommendations Other (comment)(post acute rehab in addition to possible [facility] admission)    Equipment Recommendations  Other (comment);None recommended by PT(TBA)    Recommendations for Other Services       Precautions / Restrictions Precautions Precautions: Fall Restrictions Weight Bearing Restrictions: No      Mobility  Bed Mobility Overal bed mobility: Needs Assistance             General bed mobility comments: in chair on arrival  Transfers Overall transfer level: Needs assistance Equipment used: Rolling walker (2 wheeled) Transfers: Sit to/from Stand Sit to Stand: Min assist;Min guard(depending on height of the surface.)         General transfer comment: assist to come forward if surface is lower  Ambulation/Gait Ambulation/Gait assistance: Min assist Gait Distance (Feet): 130 Feet Assistive device: Rolling walker (2 wheeled) Gait Pattern/deviations: Step-through pattern;Decreased stride length;Decreased step length - right;Decreased step length - left Gait velocity: slow   General Gait Details: slow, mildly unsteady gait. pt starts shuffling with fatigue.  Stairs            Wheelchair Mobility    Modified Rankin (Stroke Patients Only)       Balance Overall  balance assessment: Needs assistance;History of Falls Sitting-balance support: Feet supported;Single extremity supported Sitting balance-[name]: Good     Standing balance support: Bilateral upper extremity supported Standing balance-[name]: Poor Standing balance comment: dependent on RW and external support                             Pertinent Vitals/Pain Faces Pain Scale: Hurts little more Pain Location: L knee Pain Descriptors / Indicators: Discomfort Pain Intervention(s): Monitored during session    Home Living Family/patient expects to be discharged to:: Skilled nursing facility Living Arrangements: Other relatives Available Help at Discharge: Family;Available PRN/intermittently Type of Home: Mobile home Home Access: Ramped entrance     Home Layout: One level Home Equipment: [place] - 2 wheels;Cane - single point Additional Comments[facility]    Prior Function Level of Independence: Independent with assistive device(s)         Comments: uses a SPC in community and RW in home for mobility     Hand Dominance   Dominant Hand: Right    Extremity/Trunk Assessment   Upper Extremity Assessment Upper Extremity Assessment: Defer to OT evaluation    Lower Extremity Assessment Lower Extremity Assessment: RLE deficits/detail;LLE deficits/detail;Overall [facility] for tasks assessed(grossly 4/5 except 4-/5 R hip flexors.) RLE Sensation: decreased light touch LLE Sensation: decreased light touch    Cervical / Trunk Assessment Cervical / Trunk Assessment: Kyphotic  Communication   Communication: No difficulties  Cognition Arousal/Alertness: Awake/alert Behavior During Therapy: WFL for tasks assessed/performed Overall Cognitive Status: Within  Functional Limits for tasks assessed                                        General Comments      Exercises     Assessment/Plan    PT Assessment Patient  needs continued PT services  PT Problem List Decreased strength;Decreased balance;Pain;Decreased range of motion;Decreased mobility;Decreased knowledge of use of DME;Decreased activity tolerance;Decreased coordination;Decreased safety awareness       PT Treatment Interventions DME instruction;Functional mobility training;Balance training;Gait training;Therapeutic activities;Stair training;Therapeutic exercise    PT Goals (Current goals can be found in the Care Plan section)  Acute Rehab PT Goals Patient Stated Goal: go to rehab to get stronger PT Goal Formulation: With patient Time For Goal Achievement: 08/05/18 Potential to Achieve Goals: Good    Frequency Min 2X/week   Barriers to discharge        Co-evaluation               AM-PAC PT "6 Clicks" Mobility  Outcome Measure Help needed turning from your back to your side while in a flat bed without using bedrails?: A Little Help needed moving from lying on your back to sitting on the side of a flat bed without using bedrails?: A Little Help needed moving to and from a bed to a chair (including a wheelchair)?: A Little Help needed standing up from a chair using your arms (e.g., wheelchair or bedside chair)?: A Little Help needed to walk in hospital room?: A Little Help needed climbing 3-5 steps with a railing? : A Lot 6 Click Score: 17    End of Session   Activity Tolerance: Patient tolerated treatment well Patient left: in chair;with call bell/phone within reach;with chair alarm set Nurse Communication: Mobility status PT Visit Diagnosis: Unsteadiness on feet (R26.81);Repeated falls (R29.6);Muscle weakness (generalized) (M62.81);Pain Pain - Right/Left: Left Pain - part of body: Knee    Time: 6195-0932 PT Time Calculation (min) (ACUTE ONLY): 29 min   Charges:   PT Evaluation $PT Eval Moderate Complexity: 1 Mod PT Treatments $Gait Training: 8-22 mins        07/29/2018  Donnella Sham, PT Acute Rehabilitation  Services [phone]  (pager) [phone]  (office)  Tessie Fass Aeriana Speece 07/29/2018, 11:04 AM

## 2018-07-29 NOTE — Progress Notes (Signed)
Patient ambulated with minimal assistance 320 feet in hallway with front wheel walker. Returned patient to his chair where he wants to stay until after dinner. Chair alarm on and audible. Will continue to monitor.

## 2018-07-29 NOTE — Progress Notes (Addendum)
  Date: 07/29/2018  Patient name: Cody Faulkner  Medical record number: [id number]  Date of birth: 07/22/1945   I have seen and evaluated this patient and I have discussed the plan of care with the house staff. Please see their note for complete details. I concur with their findings with the following additions/corrections:   Stable with no significant changes.  Continues to have paranoid delusions and hallucinations.  At the end of our visit today, he asked me to lean close and then whispered conspiratorial he that "they are going to put pills in my food when I am not looking".  He remains a danger to himself and others and requires inpatient geriatric psychiatric admission.  He is medically cleared for transfer when ready.  We will continue to replete his low vitamin B12.  Lenice Pressman, M.D., Ph.D. 07/29/2018, 2:24 PM

## 2018-07-29 NOTE — Progress Notes (Addendum)
   Subjective:  Cody Faulkner is a 73 y.o. with PMH of PAF on eliquis, dilated cardiomyopathy s/p AICD, HTN, HLD, COPD, CKD3, OSA, GOUT and DJD admit for psychosis on hospital day 3  Cody Faulkner was examined and evaluated at bedside this AM. He was observed resting comfortably in bed. He had received breakfast but have not started eating yet. States he has no acute complaints at this time but privately informed attending that he was afraid the nursing staff would put something in his food unless he kept constant watch over it.  Objective:  Vital signs in last 24 hours: Vitals:   07/28/18 2206 07/29/18 0541 07/29/18 0814 07/29/18 1109  BP: 107/65 119/67  (!) 109/54  Pulse: (!) 48 (!) 48  63  Resp: 16 16  19   Temp: 98.4 F (36.9 C) 98.6 F (37 C)  98.2 F (36.8 C)  TempSrc: Oral Oral  Oral  SpO2: 96% 97% 93% 99%  Weight:      Height:       Physical Exam  Constitutional: No distress.  Chronically ill-appearing  Eyes:  Hematoma over the left eye without surrounding edema or erythema  Cardiovascular: Normal rate, regular rhythm, normal heart sounds and intact distal pulses.  Musculoskeletal: Normal range of motion.        General: No edema.  Skin: Skin is warm and dry.  Hyperpigmentation of lower extremities on anterior shin   Assessment/Plan:  Principal Problem:   Hallucinations Active Problems:   Hypothyroidism   AMS (altered mental status)   B12 deficiency  Cody Faulkner is a 73 yo M w/ PMH of PAF on eliquis, dilated cardiomyopathy s/p AICD, htn, hld, copd, CKD3, osa, gout and djd admit for psychosis. Intrinsic factor antibodies returned within normal limits. Will continue with current oral B12 repletion. Continuing to await placement for inpatient psych.  Visual, Auditory hallucinations 2/2 B12 deficiency vs Lewy Body dementia vs Depression w/ psychotic features Intrinsic Factor Antibody 1.1 Continuing to have acute psychosis - C/w vitamin B12 2072mcg daily - C/w Risperdal  0.5mg  BID (can increase to 1mg  BID on 2/26) - recommend repeat EKG at that time - C/w delirium precautions - Appreciate social work support in finding placement for geri-psych bed  Asymptomatic Bradycardia 2/2 hx of intraventicular conduction delay Hx of AICD implant due to prior episodes of A.fib/Vtach. Current bp 119/67, HR 48. No significant change in telemetry over the last 2 days. Will d/c telemetry to reduce possible inciting factors for psychosis. - Will need to monitor EKGs now that we are starting QT prolonging meds - Can consider EP consult if decompensating - Discontinue telemetry  DVT prophx: Eliquis Diet: Cardiac Bowel: Senokot Code: Full  Dispo: Anticipated discharge in approximately 2-3 day(s).   [name], MD 07/29/2018, 12:58 PM Pager: [phone]

## 2018-07-29 NOTE — Progress Notes (Signed)
CSW confirmed that Cody Faulkner has received referral. They are reviewing patient.   Percell Locus Alaijah Gibler LCSW [phone]

## 2018-07-30 NOTE — Progress Notes (Signed)
   Subjective:  Cody Faulkner is a 73 y.o. with PMH of PAF on eliquis, dilated cardiomyopathy s/p AICD, HTN, HLD, COPD, CKD3, OSA, GOUT and DJD admit for psychosis on hospital day 4  Cody Faulkner was seen at bedside this morning. He was resting comfortably in his bed. He states he was able to have some breakfast. He denies hearing any voices so far this morning, but does ask that we don't talk too loudly as you never know who is listening on the other side of the door. He asks if his family knows what is going on and he was informed that they do. He has no other complaints or questions at this time.  Objective:  Vital signs in last 24 hours: Vitals:   07/29/18 0814 07/29/18 1109 07/29/18 2033 07/30/18 0505  BP:  (!) 109/54 (!) 146/73 (!) 111/58  Pulse:  63 (!) 51 (!) 43  Resp:  19 17 18   Temp:  98.2 F (36.8 C) 97.7 F (36.5 C) 98.6 F (37 C)  TempSrc:  Oral Oral Oral  SpO2: 93% 99% 98% 98%  Weight:      Height:       Physical Exam  Constitutional: No distress.  Chronically ill-appearing  Eyes:  Hematoma over the left eye without surrounding edema or erythema  Cardiovascular: Normal rate, regular rhythm, normal heart sounds and intact distal pulses.  Musculoskeletal: Normal range of motion.        General: No edema.  Skin: Skin is warm and dry.  Hyperpigmentation of lower extremities on anterior shin   Assessment/Plan:  Principal Problem:   Hallucinations Active Problems:   Hypothyroidism   AMS (altered mental status)   B12 deficiency  Cody Faulkner is a 73 yo M w/ PMH of PAF on eliquis, dilated cardiomyopathy s/p AICD, htn, hld, copd, CKD3, osa, gout and djd admit for psychosis. Intrinsic factor antibodies returned within normal limits. Continuing with current oral B12 repletion. Continuing to await placement for inpatient psych.  Visual, Auditory hallucinations 2/2 B12 deficiency vs Lewy Body dementia vs Depression w/ psychotic features Intrinsic Factor Antibody 1.1  Continuing to have acute psychosis - C/w vitamin B12 206mcg daily - C/w Risperdal 0.5mg  BID (can increase to 1mg  BID on 2/26) - recommend repeat EKG at that time - C/w delirium precautions - Appreciate social work support in finding placement for geri-psych bed, has been IVC'd as this is required for geri-psych placement.  Asymptomatic Bradycardia 2/2 hx of intraventicular conduction delay Hx of AICD implant due to prior episodes of A.fib/Vtach. No significant changes in telemetry over initial 2 days, telemetry discontinued. - Will need to monitor EKGs as above now that we are starting QT prolonging meds - Can consider EP consult if decompensating  DVT prophx: Eliquis Diet: Cardiac Bowel: Senokot Code: Full  Dispo: Anticipated discharge in approximately 2-3 day(s).   [name], MD 07/30/2018, 7:09 AM

## 2018-07-30 NOTE — Plan of Care (Signed)
Discussed with patient plan of care for the evening, pain management and potentially walking in the hallway before bedtime with some teach back displayed.

## 2018-07-30 NOTE — Plan of Care (Signed)
Discussed with patient plan of care for the evening, pain management and time for transferring from chair to bed with some teach back displayed

## 2018-07-31 DIAGNOSIS — I480 Paroxysmal atrial fibrillation: Secondary | ICD-10-CM

## 2018-07-31 DIAGNOSIS — N183 Chronic kidney disease, stage 3 (moderate): Secondary | ICD-10-CM

## 2018-07-31 DIAGNOSIS — E785 Hyperlipidemia, unspecified: Secondary | ICD-10-CM

## 2018-07-31 DIAGNOSIS — I129 Hypertensive chronic kidney disease with stage 1 through stage 4 chronic kidney disease, or unspecified chronic kidney disease: Secondary | ICD-10-CM

## 2018-07-31 DIAGNOSIS — M1990 Unspecified osteoarthritis, unspecified site: Secondary | ICD-10-CM

## 2018-07-31 DIAGNOSIS — M109 Gout, unspecified: Secondary | ICD-10-CM

## 2018-07-31 DIAGNOSIS — I420 Dilated cardiomyopathy: Secondary | ICD-10-CM

## 2018-07-31 DIAGNOSIS — Z7901 Long term (current) use of anticoagulants: Secondary | ICD-10-CM

## 2018-07-31 DIAGNOSIS — Z95810 Presence of automatic (implantable) cardiac defibrillator: Secondary | ICD-10-CM

## 2018-07-31 DIAGNOSIS — J449 Chronic obstructive pulmonary disease, unspecified: Secondary | ICD-10-CM

## 2018-07-31 DIAGNOSIS — G4733 Obstructive sleep apnea (adult) (pediatric): Secondary | ICD-10-CM

## 2018-07-31 NOTE — Progress Notes (Signed)
Patient ambulated for 15 minutes  Did very well with walker and standby assist with gait belt.

## 2018-07-31 NOTE — Progress Notes (Signed)
Received call from [place], Pt's daughter-in-law, stating that they started receiving multiple calls from the Pt saying there is a man in the room who is trying to get him, kill his dog and not to say anything about it to hospital staff because they might think he is crazy. Rounded on Pt, he is sitting comfortably in the chair, alert and oriented to person, time and place, denies seeing a man in the room and that he feels fine. Offered to turn on the lights but Pt refused, he said the light from TV is enough. Will continue to monitor.

## 2018-07-31 NOTE — Progress Notes (Signed)
   Subjective:  Cody Faulkner is a 73 y.o. with PMH of PAF on eliquis, dilated cardiomyopathy s/p AICD, HTN, HLD, COPD, CKD3, OSA, GOUT and DJD admit for psychosis on hospital day 5  Cody Faulkner was seen at bedside this morning. He was resting comfortably in bed. He has been eating good and having bowl movements. He states he has spoken with his cousin and asked when he would be able to get out of here. We told him he would likely bee able to go to another facility for further care this week. He has no other questions or concerns this morning.  Objective:  Vital signs in last 24 hours: Vitals:   07/30/18 2047 07/30/18 2249 07/31/18 0737 07/31/18 0757  BP:  116/66 115/69   Pulse:  (!) 51 (!) 45 (!) 51  Resp:  19 20 16   Temp:  98.3 F (36.8 C) 98.5 F (36.9 C)   TempSrc:  Oral Oral   SpO2: 98% 97% 96% 95%  Weight:      Height:       Physical Exam  Constitutional: No distress.  Chronically ill-appearing  Eyes:  Hematoma over the left eye without surrounding edema or erythema  Cardiovascular: Normal rate, regular rhythm, normal heart sounds and intact distal pulses.  Musculoskeletal: Normal range of motion.        General: No edema.  Skin: Skin is warm and dry.  Hyperpigmentation of lower extremities on anterior shin   Assessment/Plan:  Principal Problem:   Hallucinations Active Problems:   Hypothyroidism   AMS (altered mental status)   B12 deficiency  Cody Faulkner is a 73 yo M w/ PMH of PAF on eliquis, dilated cardiomyopathy s/p AICD, htn, hld, copd, CKD3, osa, gout and djd admit for psychosis. Intrinsic factor antibodies returned within normal limits. Continuing with current oral B12 repletion. Continuing to await placement for inpatient psych.  Visual, Auditory hallucinations 2/2 B12 deficiency vs Lewy Body dementia vs Depression w/ psychotic features Intrinsic Factor Antibody 1.1 Continuing to have acute psychosis - C/w vitamin B12 2028mcg daily - C/w Risperdal 0.5mg  BID  (can increase to 1mg  BID on 2/26) - recommend repeat EKG at that time - C/w delirium precautions - Appreciate social work support in finding placement for geri-psych bed, has been IVC'd as this is required for geri-psych placement.  Asymptomatic Bradycardia 2/2 hx of intraventicular conduction delay Hx of AICD implant due to prior episodes of A.fib/Vtach. No significant changes in telemetry over initial 2 days, telemetry discontinued. - Will need to monitor EKGs as above now that we are starting QT prolonging meds - Can consider EP consult if decompensating  DVT prophx: Eliquis Diet: Cardiac Bowel: Senokot Code: Full   Dispo: Anticipated discharge in approximately 1-3 day(s).   Cody Seat, MD 07/31/2018, 2:24 PM

## 2018-07-31 NOTE — Progress Notes (Signed)
Internal Medicine Attending  Date: 07/31/2018  Patient name: Cody Faulkner Medical record number: [id number] Date of birth: September 21, 1945 Age: 73 y.o. Gender: male  I saw and evaluated the patient. I reviewed the resident's note by Dr. Trilby Drummer and I agree with the resident's findings and plans as documented in his progress note.  When seen on rounds Cody Faulkner was feeling well and was without acute complaints.  We continue to await placement in a Geri-psych facility.

## 2018-08-01 ENCOUNTER — Other Ambulatory Visit: Payer: Self-pay | Admitting: Internal Medicine

## 2018-08-01 DIAGNOSIS — I119 Hypertensive heart disease without heart failure: Secondary | ICD-10-CM

## 2018-08-01 MED ORDER — ISOSORBIDE MONONITRATE ER 60 MG PO TB24: 60.0000 mg | ORAL_TABLET | Freq: Every day | ORAL | 0 refills | Status: DC

## 2018-08-01 MED ORDER — VITAMIN B-12 1000 MCG PO TABS
1000.0000 ug | ORAL_TABLET | Freq: Every day | ORAL | Status: DC
  Administered 2018-08-02 – 2018-08-23 (×22): 1000 ug via ORAL
  Filled 2018-08-01 (×22): qty 1

## 2018-08-01 NOTE — Care Management Important Message (Signed)
Important Message  Patient Details  Name: Cody Faulkner MRN: [id number] Date of Birth: 03/16/1946   Medicare Important Message Given:  Yes    Orbie Pyo 08/01/2018, 4:36 PM

## 2018-08-01 NOTE — Addendum Note (Signed)
Addended by: Mady Haagensen on: 08/01/2018 11:44 AM   Modules accepted: Orders

## 2018-08-01 NOTE — Progress Notes (Signed)
  Date: 08/01/2018  Patient name: Cody Faulkner  Medical record number: [id number]  Date of birth: [birth year]   I have seen and evaluated this patient and I have discussed the plan of care with the house staff. Please see their note for complete details. I concur with their findings with the following additions/corrections:   Medically stable, but with ongoing delusion about a man trying to kill him. He is cleared for transfer to geriatric psychiatry inpatient when bed available.  Evidently, his referral was denied today due to "VTach and prolonged QTc". To be clear, neither of these are active issues for him. The VTach history is remote and he has an AICD in place. His QTc today on his EKG is 410, 402 by my calculation, which is normal. He does not have prolonged QTc.   Lenice Pressman, M.D., Ph.D. 08/01/2018, 2:50 PM

## 2018-08-01 NOTE — Progress Notes (Signed)
Physical Therapy Treatment Patient Details Name: Cody Faulkner MRN: [id number] DOB: 03-22-1946 Today's Date: 08/01/2018    History of Present Illness 73 year old man with a history of CAD and ICD/defibrillator, paroxysmal A. fib, CKD 3, gout, and hypothyroidism admitted from his nursing home with hallucinations.    PT Comments    Progressing steadily.  Initial safety limited by L knee pain and instability which improves with time and distance walking.   Follow Up Recommendations  Other (comment)     Equipment Recommendations  None recommended by PT    Recommendations for Other Services       Precautions / Restrictions Precautions Precautions: Fall Precaution Comments: vision limited on L    Mobility  Bed Mobility               General bed mobility comments: in chair on arrival  Transfers Overall transfer level: Needs assistance Equipment used: Rolling walker (2 wheeled) Transfers: Sit to/from Stand Sit to Stand: Min assist         General transfer comment: assist to come forward.  pt with significant use of UE's  Ambulation/Gait Ambulation/Gait assistance: Min assist Gait Distance (Feet): 550 Feet Assistive device: Rolling walker (2 wheeled) Gait Pattern/deviations: Step-through pattern;Decreased step length - right;Decreased step length - left;Decreased stance time - left;Decreased stride length Gait velocity: slow Gait velocity interpretation: 1.31 - 2.62 ft/sec, indicative of limited community ambulator General Gait Details: low amplitude steps on the Left and shuffle on the Right.  Generally antalgic on the left, improving at mid range then shuffled steps exclusively once fatigue sets in.   Stairs             Wheelchair Mobility    Modified Rankin (Stroke Patients Only)       Balance     Sitting balance-[name]: Good       Standing balance-[name]: Poor Standing balance comment: dependent on RW orexternal support                             Cognition Arousal/Alertness: Awake/alert Behavior During Therapy: WFL for tasks assessed/performed Overall Cognitive Status: Within Functional Limits for tasks assessed                                        Exercises      General Comments        Pertinent Vitals/Pain Pain Assessment: Faces Faces Pain Scale: Hurts little more Pain Location: L knee Pain Descriptors / Indicators: Discomfort Pain Intervention(s): Patient requesting pain meds-RN notified;Monitored during session    Home Living                      Prior Function            PT Goals (current goals can now be found in the care plan section) Acute Rehab PT Goals Patient Stated Goal: go to rehab to get stronger PT Goal Formulation: With patient Time For Goal Achievement: 08/05/18 Potential to Achieve Goals: Good Progress towards PT goals: Progressing toward goals    Frequency    Min 2X/week      PT Plan Current plan remains appropriate    Co-evaluation              AM-PAC PT "6 Clicks" Mobility   Outcome Measure  Help needed turning from your  back to your side while in a flat bed without using bedrails?: A Little Help needed moving from lying on your back to sitting on the side of a flat bed without using bedrails?: A Little Help needed moving to and from a bed to a chair (including a wheelchair)?: A Little Help needed standing up from a chair using your arms (e.g., wheelchair or bedside chair)?: A Little Help needed to walk in hospital room?: A Little Help needed climbing 3-5 steps with a railing? : A Lot 6 Click Score: 17    End of Session   Activity Tolerance: Patient tolerated treatment well Patient left: in chair;with call bell/phone within reach;with chair alarm set Nurse Communication: Mobility status PT Visit Diagnosis: Unsteadiness on feet (R26.81);Repeated falls (R29.6);Muscle weakness (generalized) (M62.81);Pain Pain -  Right/Left: Left Pain - part of body: Knee     Time: 0626-9485 PT Time Calculation (min) (ACUTE ONLY): 33 min  Charges:  $Gait Training: 23-37 mins                     08/01/2018  Donnella Sham, PT Acute Rehabilitation Services [phone]  (pager) [phone]  (office)   Tessie Fass Kiyah Demartini 08/01/2018, 4:38 PM

## 2018-08-01 NOTE — Plan of Care (Signed)
  Problem: Education: Goal: Knowledge of General Education information will improve Description: Including pain rating scale, medication(s)/side effects and non-pharmacologic comfort measures Outcome: Progressing   Problem: Health Behavior/Discharge Planning: Goal: Ability to manage health-related needs will improve Outcome: Progressing   Problem: Clinical Measurements: Goal: Ability to maintain clinical measurements within normal limits will improve Outcome: Progressing Goal: Will remain free from infection Outcome: Progressing Goal: Diagnostic test results will improve Outcome: Progressing Goal: Respiratory complications will improve Outcome: Progressing Goal: Cardiovascular complication will be avoided Outcome: Progressing   Problem: Activity: Goal: Risk for activity intolerance will decrease Outcome: Progressing   Problem: Elimination: Goal: Will not experience complications related to bowel motility Outcome: Progressing Goal: Will not experience complications related to urinary retention Outcome: Progressing   

## 2018-08-01 NOTE — Progress Notes (Signed)
   Subjective:  Cody Faulkner is a 73 y.o. with PMH of PAF on eliquis, dilated cardiomyopathy s/p AICD, HTN, HLD, COPD, CKD3, OSA, GOUT and DJD admit for psychosis on hospital day 6  Cody Faulkner was seen at bedside chair this morning. He had an empty breakfast in front of him and appeared comfortable in his chair. He states he had some left knee pain during physical therapy with walking but otherwise has no complaints at this time.   Objective:  Vital signs in last 24 hours: Vitals:   07/31/18 2016 07/31/18 2228 08/01/18 0349 08/01/18 0925  BP:  120/60 114/74   Pulse:  (!) 45 (!) 43   Resp:  18 18   Temp:  97.9 F (36.6 C) 98.5 F (36.9 C)   TempSrc:      SpO2: 97% 97% 98% 98%  Weight:      Height:       Physical Exam  Constitutional: No distress.  Appear relaxed and comfortable  Eyes:  Hematoma over the left eye appear smaller  Cardiovascular: Normal rate, regular rhythm and intact distal pulses.  No murmur heard. Pulmonary/Chest: Effort normal and breath sounds normal. He has no wheezes. He has no rales.  Musculoskeletal: Normal range of motion.        General: No edema.  Skin: Skin is warm and dry.  Chronic venous stasis skin changes on lower extremities   Assessment/Plan:  Principal Problem:   Hallucinations Active Problems:   Hypothyroidism   AMS (altered mental status)   B12 deficiency  Cody Faulkner is a 73 yo M w/ PMH of PAF on eliquis, dilated cardiomyopathy s/p AICD, htn, hld, copd, CKD3, osa, gout and djd admit for psychosis. Awaiting placement for geri-psych bed.  Visual, Auditory hallucinations 2/2 B12 deficiency vs Lewy Body dementia vs Depression w/ psychotic features Continuing to have psychosis per nursing note overnight. No evidence of poor oral absorption. Will adjust B12 oral repletion dose. Will plan on titrating up Risperdal dose after 7 days on current dose - Start vitamin B12 1035mcg daily - C/w Risperdal 0.5mg  BID (up to 1mg  BID on 2/26) [Repeat  EKG at the time] - C/w delirium precautions - Appreciate social work support in finding placement for geri-psych bed, has been IVC'd as this is required for geri-psych placement.  DVT prophx: Eliquis Diet: Cardiac Bowel: Senokot Code: Full   Dispo: Anticipated discharge in approximately 1-3 day(s).   [name], MD 08/01/2018, 9:46 AM Pager: [phone]

## 2018-08-01 NOTE — Progress Notes (Signed)
CSW contacted [facility] regarding referral. They report patient was denied due to prolonged QTC and vtach. CSW will send referral again.   Percell Locus Mayrene Bastarache LCSW [phone]

## 2018-08-02 NOTE — Progress Notes (Addendum)
   Subjective:  Cody Faulkner is a 73 y.o. with PMH of PAF on eliquis, dilated cardiomyopathy s/p AICD, HTN, HLD, COPD, CKD3, OSA, GOUT and DJD admit for psychosis on hospital day 7  Cody Faulkner was examined and evaluated at bedside this chair this AM with multiple family members present. He continues to endorse left knee discomfort with physical therapy but he states 'it don't bother me.' He denies any other acute complaints. Family members mention that compared to a week ago, Cody Faulkner appears much more calm and relaxed. They expressed understanding that we are awaiting placement for geri-psych bed.  Objective:  Vital signs in last 24 hours: Vitals:   08/01/18 2000 08/01/18 2144 08/02/18 0527 08/02/18 0945  BP:  (!) 149/62 (!) 108/56   Pulse:  (!) 52 (!) 51   Resp:   17   Temp:  98 F (36.7 C) 98 F (36.7 C)   TempSrc:  Oral Oral   SpO2: 96% 97% 94% 97%  Weight:      Height:       Gen: Well-developed, well nourished, NAD HEENT: Chronic left eye hematoma CV: RRR, S1, S2 normal, No rubs, no murmurs, no gallops Pulm: Distant breath sounds, no rales, no wheezes Abd: Soft, BS+, NTND Extm: ROM intact, Left knee crepitus on flexion Skin: Dry, Warm, hyperpigmentation on bilateral shins consistent with chronic venous stasis Neuro: AAOx3  Assessment/Plan:  Principal Problem:   Hallucinations Active Problems:   Hypothyroidism   AMS (altered mental status)   B12 deficiency  Cody Faulkner is a 73 yo M w/ PMH of PAF on eliquis, dilated cardiomyopathy s/p AICD, htn, hld, copd, CKD3, osa, gout and djd admit for psychosis. SNF declined placement due to prior hx of v.tach despite patient having AICD and current sinus rhythm. Social work re-applying for placement.  Visual, Auditory hallucinations 2/2 B12 deficiency vs Lewy Body dementia vs Depression w/ psychotic features Subjective improvement in agitation/mood per family members. Tolerating risperdal well without obvious significant side  effects. - C/w vitamin B12 1068mcg daily - C/w Risperdal 0.5mg  BID (up to 1mg  BID on 2/26) [Repeat EKG at the time] - C/w delirium precautions - Appreciate social work support in finding placement for geri-psych bed  DVT prophx: Eliquis Diet: Cardiac Bowel: Senokot Code: Full   Dispo: Anticipated discharge in approximately 1-3 day(s).   [name], MD 08/02/2018, 12:25 PM Pager: [phone]

## 2018-08-02 NOTE — Progress Notes (Signed)
  Date: 08/02/2018  Patient name: Cody Faulkner  Medical record number: [id number]  Date of birth: Nov 20, 1945   I have seen and evaluated this patient and I have discussed the plan of care with the house staff. Please see their note for complete details. I concur with their findings with the following additions/corrections:   Medically stable, awaiting geriatric psych bed. To reiterate, he does not have prolonged QTc, and his history of Tanna Furry is remote and he has an AICD in place. He is medically ready for psych transfer, with no medical barriers to transfer.   Lenice Pressman, M.D., Ph.D. 08/02/2018, 1:50 PM

## 2018-08-02 NOTE — Progress Notes (Signed)
CSW faxed the patient out to the following:  [facility]  CSW will continue to follow.   Domenic Schwab, MSW, [place]

## 2018-08-03 NOTE — Plan of Care (Signed)
Patient is making progress in the plan of care

## 2018-08-03 NOTE — Progress Notes (Signed)
Physical Therapy Treatment Patient Details Name: Cody Faulkner MRN: [id number] DOB: 04-May-1946 Today's Date: 08/03/2018    History of Present Illness 73 year old man with a history of CAD and ICD/defibrillator, paroxysmal A. fib, CKD 3, gout, and hypothyroidism admitted from his nursing home with hallucinations.    PT Comments    Pt always eager to push his mobility.  Emphasized improved gait quality and stamina.   Follow Up Recommendations  Other (comment)(post acute or [facility])     Equipment Recommendations  None recommended by PT    Recommendations for Other Services       Precautions / Restrictions Precautions Precautions: Fall Precaution Comments: vision limited on L    Mobility  Bed Mobility               General bed mobility comments: in chair on arrival  Transfers Overall transfer level: Needs assistance Equipment used: Rolling walker (2 wheeled) Transfers: Sit to/from Stand Sit to Stand: Min guard         General transfer comment: pt came forward on his own today after L knee was warme up sufficiently  Ambulation/Gait Ambulation/Gait assistance: Min guard Gait Distance (Feet): 460 Feet Assistive device: Rolling walker (2 wheeled) Gait Pattern/deviations: Step-through pattern;Decreased step length - right;Decreased step length - left;Decreased stance time - left;Decreased stride length Gait velocity: slower Gait velocity interpretation: <1.8 ft/sec, indicate of risk for recurrent falls General Gait Details: low amplitude steps on the Left and Right.   Less antalgic, but more shuffle with fatigue.   Stairs             Wheelchair Mobility    Modified Rankin (Stroke Patients Only)       Balance     Sitting balance-[name]: Good       Standing balance-[name]: Poor Standing balance comment: dependent on RW orexternal support                            Cognition Arousal/Alertness: Awake/alert Behavior During  Therapy: WFL for tasks assessed/performed Overall Cognitive Status: Within Functional Limits for tasks assessed                                        Exercises      General Comments        Pertinent Vitals/Pain Pain Assessment: Faces Faces Pain Scale: Hurts little more Pain Location: L knee Pain Descriptors / Indicators: Discomfort Pain Intervention(s): Premedicated before session    Home Living                      Prior Function            PT Goals (current goals can now be found in the care plan section) Acute Rehab PT Goals Patient Stated Goal: go to rehab to get stronger PT Goal Formulation: With patient Time For Goal Achievement: 08/05/18 Potential to Achieve Goals: Good Progress towards PT goals: Progressing toward goals    Frequency    Min 2X/week      PT Plan Current plan remains appropriate    Co-evaluation              AM-PAC PT "6 Clicks" Mobility   Outcome Measure  Help needed turning from your back to your side while in a flat bed without using bedrails?: A Little Help needed  moving from lying on your back to sitting on the side of a flat bed without using bedrails?: A Little Help needed moving to and from a bed to a chair (including a wheelchair)?: A Little Help needed standing up from a chair using your arms (e.g., wheelchair or bedside chair)?: A Little Help needed to walk in hospital room?: A Little Help needed climbing 3-5 steps with a railing? : A Lot 6 Click Score: 17    End of Session   Activity Tolerance: Patient tolerated treatment well Patient left: in chair;with call bell/phone within reach;with chair alarm set Nurse Communication: Mobility status PT Visit Diagnosis: Unsteadiness on feet (R26.81);Repeated falls (R29.6);Muscle weakness (generalized) (M62.81);Pain Pain - Right/Left: Left Pain - part of body: Knee     Time: 1644-1700 PT Time Calculation (min) (ACUTE ONLY): 16 min  Charges:   $Gait Training: 8-22 mins                     08/03/2018  Cody Faulkner, PT Acute Rehabilitation Services [phone]  (pager) [phone]  (office)   Cody Faulkner 08/03/2018, 6:19 PM

## 2018-08-03 NOTE — Progress Notes (Signed)
Patient's family member [name] called to inform me that after visiting with the patient he wanted to report that the patient was hearing voices again. Cody Faulkner was saying that "the man" is trying to kill his dog and "the man" is stealing his land. [name] wanted me to know so that I could document this information in the chart.

## 2018-08-03 NOTE — Progress Notes (Signed)
   Subjective:  Cody Faulkner is a 73 y.o. with PMH of PAF on eliquis, dilated cardiomyopathy s/p AICD, HTN, HLD, COPD, CKD3, OSA, GOUT and DJD admit for psychosis on hospital day 8  Cody Faulkner was examined and evaluated at bedside this chair this AM. He was observed sitting comfortably. Appeared to be in good mood. Empty breakfast tray at bedside. He states eating too much food is probably not good for him but he was taught to not waste food. No acute complaints at this time. He requests information on expected discharge date. Informed him that we do not know and social work may be able to provide better estimated discharge date. Cody Faulkner expressed understanding.  Objective:  Vital signs in last 24 hours: Vitals:   08/02/18 2005 08/02/18 2133 08/03/18 0617 08/03/18 0821  BP:  (!) 101/58 (!) 106/54   Pulse:  (!) 51 (!) 45   Resp:   18   Temp:  98.9 F (37.2 C) 98.1 F (36.7 C)   TempSrc:  Oral    SpO2: 96% 95% 96% 96%  Weight:      Height:       Gen: Well-developed, well nourished, NAD HEENT: Chronic left eye hematoma CV: Bradycardic, S1, S2 normal, No rubs, no murmurs, no gallops Pulm: Distant breath sounds, no rales, no wheezes Abd: Soft, BS+, NTND Extm: ROM intact Skin: Dry, Warm, hyperpigmentation on bilateral shins consistent with chronic venous stasis Neuro: AAOx3  Assessment/Plan:  Principal Problem:   Psychosis ([facility]) Active Problems:   Hypothyroidism   AMS (altered mental status)   B12 deficiency  Cody Faulkner is a 73 yo M w/ PMH of PAF on eliquis, dilated cardiomyopathy s/p AICD, htn, hld, copd, CKD3, osa, gout and djd admit for psychosis. Social work continuing to work on finding placement.  Visual, Auditory hallucinations 2/2 Depression w/ psychotic features Showing significant improvement without obvious agitation. With such good response, unclear if due to b12 repletion or effect of risperdal. Will hold off on increasing dose of antipsychotic since he appears to be  improving significantly. - C/w vitamin B12 1025mcg daily - C/w Risperdal 0.5mg  BID - C/w delirium precautions - Appreciate social work support in finding placement for geri-psych bed  DVT prophx: Eliquis Diet: Cardiac Bowel: Senokot Code: Full   Dispo: Anticipated discharge in approximately 1-3 day(s).   [name], MD 08/03/2018, 11:05 AM Pager: [phone]

## 2018-08-03 NOTE — Progress Notes (Signed)
  Date: 08/03/2018  Patient name: Cody Faulkner  Medical record number: [id number]  Date of birth: 1946/02/11   I have seen and evaluated this patient and I have discussed the plan of care with the house staff. Please see their note for complete details. I concur with their findings with the following additions/corrections:   Stable from a medical standpoint.  Awaiting geriatric psychiatric placement.  He does seem to have calmed down somewhat and his conversation is more based in reality.  We will continue treatment with vitamin B12 and risperidone.  Lenice Pressman, M.D., Ph.D. 08/03/2018, 2:53 PM

## 2018-08-03 NOTE — Progress Notes (Signed)
CSW followed up on referral to [facility]. They report not receiving the referral. CSW confirmed fax number and sent again.   Cody Locus Betrice Wanat LCSW [phone]

## 2018-08-04 NOTE — Progress Notes (Addendum)
  Date: 08/04/2018  Patient name: Cody Faulkner  Medical record number: [id number]  Date of birth: [birth year]   I have seen and evaluated this patient and I have discussed the plan of care with the house staff. Please see their note for complete details. I concur with their findings with the following additions/corrections:   Calm and talkative this afternoon. Apparently has still been talking with family about someone trying to kill him.  This afternoon, his main complaint is pain in his left knee.  He does not think he got any Tylenol today.  I checked, and it is written for as needed.  We will need to remind him to ask for it if he needs a, or we can schedule it if he is really needing it on a regular basis.  He also asked me again about the ringing in his ears he has occasionally and whether he would benefit from a hearing aid.  I explained that he will have to wait until after discharge to go see audiology and have hearing testing and get fitted for hearing aids if he needs them.  Apparently, we are still getting denials from behavioral health facilities due to "medical issues".  I called and spoke with [facility], who stated he was declined because he had a heart rate of 40 at the time of their evaluation.  I explained that his heart is stable and there should be no concerns about his cardiac status.  They told me a medical provider will reach back out to me to discuss it further.  Lenice Pressman, M.D., Ph.D. 08/04/2018, 3:22 PM

## 2018-08-04 NOTE — Progress Notes (Signed)
   Subjective:  Cody Faulkner is a 73 y.o. with PMH of PAF on eliquis, dilated cardiomyopathy s/p AICD, HTN, HLD, COPD, CKD3, OSA, GOUT and DJD admit for psychosis on hospital day 9  Cody Faulkner was examined and evaluated at bedside this chair this AM. He was observed sitting comfortably. He states he had a disagreement with his cousin this morning and was quite upset but everything 'has been sorted out.' He mentions that his cousin 'Cody Faulkner' stated he is concerned about him and would like to speak with a physician. Otherwise he feels great. He would like to know when he can leave the hospital and return home. He states he lives in a trailer by himself and its not great but he misses his home. No other complaints currently.  Objective:  Vital signs in last 24 hours: Vitals:   08/03/18 1424 08/03/18 2108 08/04/18 0549 08/04/18 0725  BP: (!) 110/58 110/60 (!) 166/72   Pulse: (!) 49 (!) 50 (!) 45   Resp: 20 19 20    Temp: 98.9 F (37.2 C) 98.2 F (36.8 C) 98.1 F (36.7 C)   TempSrc:  Oral Oral   SpO2: 99%  96% 96%  Weight:      Height:       Gen: Well-developed, well nourished, NAD HEENT: Chronic left eye hematoma CV: Bradycardic, S1, S2 normal, No rubs, no murmurs, no gallops Pulm: Distant breath sounds, no rales, no wheezes Extm: ROM intact Skin: Dry, Warm, hyperpigmentation on bilateral shins consistent with chronic venous stasis without any ulcerations or wounds. Neuro: AAOx3  Assessment/Plan:  Principal Problem:   Psychosis ([place]) Active Problems:   Hypothyroidism   AMS (altered mental status)   B12 deficiency  Cody Faulkner is a 73 yo M w/ PMH of PAF on eliquis, dilated cardiomyopathy s/p AICD, htn, hld, copd, CKD3, osa, gout and djd admit for psychosis. Social work having difficulty finding geri-psych placement due to Cody Faulkner's comorbidities. Will reach out to Psych for re-evaluation.   Visual, Auditory hallucinations 2/2 Depression w/ psychotic features Continuing to endorse  psychosis with description of 'the man' who is out to hurt him. Stable mood. No significant agitation. - C/w vitamin B12 1061mcg daily - C/w Risperdal 0.5mg  BID - C/w delirium precautions - Appreciate social work support in finding placement for geri-psych bed  DVT prophx: Eliquis Diet: Cardiac Bowel: Senokot Code: Full   Dispo: Anticipated discharge in approximately 1-3 day(s).   Cody Anis, MD 08/04/2018, 1:20 PM Pager: [phone]

## 2018-08-04 NOTE — Progress Notes (Addendum)
Patient again denied by [facility] and [facility]. They report that patient has too many medical conditions for them to accommodate (Pacemaker, etc.). CSW pointed out that these are chronic stable conditions but they stated that they would still need the capability of handling them anyway.   CSW contacted [facility] are currently full and unable to accept referral. CSW also contacted [facility] to see what other options are available.   Percell Locus Elson Ulbrich LCSW [phone]

## 2018-08-05 MED ORDER — ACETAMINOPHEN 325 MG PO TABS
650.0000 mg | ORAL_TABLET | Freq: Three times a day (TID) | ORAL | Status: DC
  Administered 2018-08-05 – 2018-08-23 (×52): 650 mg via ORAL
  Filled 2018-08-05 (×53): qty 2

## 2018-08-05 NOTE — Progress Notes (Signed)
Physical Therapy Treatment Patient Details Name: Cody Faulkner MRN: [id number] DOB: 1945-11-22 Today's Date: 08/05/2018    History of Present Illness 73 year old man with a history of CAD and ICD/defibrillator, paroxysmal A. fib, CKD 3, gout, and hypothyroidism admitted from his nursing home with hallucinations.    PT Comments    Patient received in recliner, agrees to walk with some encouragement. Patient reports he had a rough night and is just not feeling well today. Performed sit to stand transfer with min guard assist. Ambulated 200 feet with RW and min guard assist. 2-3 brief standing rest breaks needed due to fatigue and to catch his breath. Patient demonstrates antalgic gait due to left knee pain. Decreased knee extension in standing on left. Patient will continue to benefit from skilled PT to address his weakness, and decreased activity tolerance as well as safety.     Follow Up Recommendations  Other (comment)([facility])     Equipment Recommendations  None recommended by PT    Recommendations for Other Services       Precautions / Restrictions Precautions Precautions: Fall Precaution Comments: vision limited on L Restrictions Weight Bearing Restrictions: No Other Position/Activity Restrictions: painful left knee    Mobility  Bed Mobility               General bed mobility comments: in chair on arrival  Transfers Overall transfer level: Needs assistance Equipment used: Rolling walker (2 wheeled) Transfers: Sit to/from Stand Sit to Stand: Min guard            Ambulation/Gait Ambulation/Gait assistance: Min guard Gait Distance (Feet): 200 Feet Assistive device: Rolling walker (2 wheeled) Gait Pattern/deviations: Step-through pattern;Shuffle;Decreased step length - right;Decreased step length - left Gait velocity: decreased       Stairs             Wheelchair Mobility    Modified Rankin (Stroke Patients Only)       Balance Overall  balance assessment: Needs assistance;History of Falls Sitting-balance support: Feet supported Sitting balance-[name]: Good     Standing balance support: Bilateral upper extremity supported Standing balance-[name]: Fair Standing balance comment: dependent on RW                             Cognition Arousal/Alertness: Awake/alert Behavior During Therapy: WFL for tasks assessed/performed Overall Cognitive Status: Within Functional Limits for tasks assessed                                        Exercises General Exercises - Lower Extremity Long Arc Quad: AROM;10 reps;Left;Seated(prior to walking to warm up.)    General Comments        Pertinent Vitals/Pain Pain Assessment: Faces Faces Pain Scale: Hurts little more Pain Location: L knee Pain Descriptors / Indicators: Aching;Discomfort Pain Intervention(s): Limited activity within patient's tolerance;Monitored during session    Home Living Family/patient expects to be discharged to:: Skilled nursing facility Living Arrangements: Other relatives Available Help at Discharge: Family;Available PRN/intermittently Type of Home: Mobile home Home Access: Ramped entrance   Home Layout: One level Home Equipment: Day - 2 wheels;Cane - single point Additional Comments[facility] the [facility]    Prior Function Level of Independence: Independent with assistive device(s)      Comments: uses a SPC in community and RW in home for mobility  PT Goals (current goals can now be found in the care plan section) Acute Rehab PT Goals Patient Stated Goal: go to rehab to get stronger PT Goal Formulation: With patient Time For Goal Achievement: 08/12/18 Potential to Achieve Goals: Good    Frequency    Min 2X/week      PT Plan      Co-evaluation              AM-PAC PT "6 Clicks" Mobility   Outcome Measure  Help needed turning from your back to your side while in a flat  bed without using bedrails?: A Little Help needed moving from lying on your back to sitting on the side of a flat bed without using bedrails?: A Little Help needed moving to and from a bed to a chair (including a wheelchair)?: A Little Help needed standing up from a chair using your arms (e.g., wheelchair or bedside chair)?: A Little Help needed to walk in hospital room?: A Little Help needed climbing 3-5 steps with a railing? : A Little 6 Click Score: 18    End of Session Equipment Utilized During Treatment: Gait belt Activity Tolerance: Patient limited by fatigue Patient left: in chair;with call bell/phone within reach;with chair alarm set Nurse Communication: Mobility status PT Visit Diagnosis: Unsteadiness on feet (R26.81);Muscle weakness (generalized) (M62.81);Difficulty in walking, not elsewhere classified (R26.2);Pain;History of falling (Z91.81) Pain - Right/Left: Left Pain - part of body: Knee     Time: 1200-1218 PT Time Calculation (min) (ACUTE ONLY): 18 min  Charges:  $Gait Training: 8-22 mins                     Hussam Muniz, PT, [facility] 08/05/18,12:41 PM

## 2018-08-05 NOTE — Progress Notes (Signed)
   Subjective:  Cody Faulkner is a 73 y.o. with PMH of PAF on eliquis, dilated cardiomyopathy s/p AICD, HTN, HLD, COPD, CKD3, OSA, GOUT and DJD admit for psychosis on hospital day 10  [name] was examined and evaluated at bedside this chair this AM. He was observed sitting comfortably. He states he has no acute complaints. States he had poor night of sleep but feels okay. Continuing to endorse knee pain with physical therapy. He states he has not been asking for his Tylenol. Discussed plan to schedule Tylenol. [name] expressed understanding.  Objective:  Vital signs in last 24 hours: Vitals:   08/04/18 1922 08/04/18 2047 08/05/18 0506 08/05/18 1426  BP:  (!) 153/65 (!) 121/58 (!) 100/55  Pulse:  (!) 48 (!) 44 (!) 50  Resp:  16 16 17   Temp:  97.7 F (36.5 C) 97.8 F (36.6 C) 98 F (36.7 C)  TempSrc:  Oral Oral Oral  SpO2: 98% 100% 94% 97%  Weight:      Height:       Gen: Well-developed, well nourished, NAD HEENT: Chronic left eye hematoma decreasing in size CV: Bradycardic, S1, S2 normal, No rubs, no murmurs, no gallops Pulm: CTAB, no rales, no wheezes Extm: ROM intact Skin: Dry, Warm, hyperpigmentation on bilateral shins consistent with chronic venous stasis with oozing healing ulcers Neuro: AAOx3  Assessment/Plan:  Principal Problem:   Psychosis (HCC) Active Problems:   Hypothyroidism   AMS (altered mental status)   B12 deficiency  [name] is a 73 yo M w/ PMH of PAF on eliquis, dilated cardiomyopathy s/p AICD, htn, hld, copd, CKD3, osa, gout and djd admit for psychosis. CIR resigned today as it has run out. Continuing search for geri-psych bed.  Visual, Auditory hallucinations 2/2 Depression w/ psychotic features Continuing to endorse psychosis with description of 'the man' who is out to hurt him. Stable mood. No significant agitation. - C/w vitamin B12 1025mcg daily - C/w Risperdal 0.5mg  BID - C/w delirium precautions - Appreciate social work support in finding  placement for geri-psych bed  DVT prophx: Eliquis Diet: Cardiac Bowel: Senokot Code: Full   Dispo: Anticipated discharge in approximately 1-3 day(s).   [name], MD 08/05/2018, 5:50 PM Pager: [phone]

## 2018-08-05 NOTE — Progress Notes (Addendum)
CSW received medical denial from [facility] asked if they would complete a peer to peer. RN stated CSW could send the referral again and he would be able to present the case to their Physician to see if a peer to peer can be completed. CSW faxed referral again. IVC renewed today and in shadow chart-[employer] to serve patient.   Percell Locus Wally Shevchenko LCSW [phone]

## 2018-08-05 NOTE — Progress Notes (Signed)
PT Cancellation Note  Patient Details Name: Cody Faulkner MRN: [id number] DOB: Aug 04, 1945   Cancelled Treatment:    Reason Eval/Treat Not Completed: Patient declined, no reason specified- reports he is just not feeling well this morning. Will re-attempt later today if time allows.     [name] 08/05/2018, 11:04 AM

## 2018-08-05 NOTE — Progress Notes (Signed)
  Date: 08/05/2018  Patient name: Cody Faulkner  Medical record number: [id number]  Date of birth: [birth year]   I have seen and evaluated this patient and I have discussed the plan of care with the house staff. Please see their note for complete details. I concur with their findings with the following additions/corrections:   Having some knee pain, asked to have tylenol scheduled in the morning, available PRN at night. Still awaiting a psych bed. Given the improvement of his psychosis but without resolution, may benefit from increasing risperidone dose.  Lenice Pressman, M.D., Ph.D. 08/05/2018, 10:34 PM

## 2018-08-06 DIAGNOSIS — F329 Major depressive disorder, single episode, unspecified: Secondary | ICD-10-CM

## 2018-08-06 NOTE — Progress Notes (Signed)
  Date: 08/06/2018  Patient name: Cody Faulkner  Medical record number: [id number]  Date of birth: 1945/12/23   I have seen and evaluated this patient and I have discussed the plan of care with the house staff. Please see Dr. Marguerita Beards note for complete details. I concur with his findings and plan.   Sid Falcon, MD 08/06/2018, 8:22 PM

## 2018-08-06 NOTE — Progress Notes (Signed)
Patient ambulated one full lap around nursing unit. Denies any pain/compliants. Will continue to monitor.  Hiram Comber, RN 08/06/2018 6:17 PM

## 2018-08-06 NOTE — Progress Notes (Signed)
   Subjective:  Cody Faulkner is a 73 y.o. with PMH of PAF on eliquis, dilated cardiomyopathy s/p AICD, HTN, HLD, COPD, CKD3, OSA, GOUT and DJD admit for psychosis on hospital day 11  Cody Faulkner was examined and evaluated at bedside this chair this AM. He was observed sitting comfortably. He states he is doing fine. States scheduled tylenol appears to help with his knee pain a little bit but requesting how he can 'get it fixed.' Discussed option to pursue outpatient ortho in the future. Cody Faulkner expressed understanding.  Objective:  Vital signs in last 24 hours: Vitals:   08/05/18 1950 08/05/18 2154 08/06/18 0501 08/06/18 0829  BP:  (!) 124/55 131/60   Pulse:  (!) 41 (!) 42   Resp:  17 18   Temp:  97.6 F (36.4 C) (!) 97.5 F (36.4 C)   TempSrc:  Oral Oral   SpO2: 95% 99% 96% 98%  Weight:      Height:       Gen: Well-developed, well nourished, NAD HEENT: Chronic left eye hematoma decreasing in size CV: Bradycardic, S1, S2 normal, No rubs, no murmurs, no gallops Pulm: CTAB, no rales, no wheezes Extm: ROM intact Skin: Dry, Warm, bilateral shin hyperpigmentation with dried scabs. Neuro: AAOx3  Assessment/Plan:  Principal Problem:   Psychosis ([place]) Active Problems:   Hypothyroidism   AMS (altered mental status)   B12 deficiency  Cody Faulkner is a 73 yo M w/ PMH of PAF on eliquis, dilated cardiomyopathy s/p AICD, htn, hld, copd, CKD3, osa, gout and djd admit for psychosis. No further updates on geri-psych bed placement yet.  Visual, Auditory hallucinations 2/2 Depression w/ psychotic features Endorsing psychosis of 'the man' who is out to hurt him and his dog. Stable mood. No significant agitation. - C/w vitamin B12 1076mcg daily - C/w Risperdal 0.5mg  BID - C/w delirium precautions - Appreciate social work support in finding placement for geri-psych bed  DVT prophx: Eliquis Diet: Cardiac Bowel: Senokot Code: Full   Dispo: Anticipated discharge in approximately 1-3 day(s).     [name], MD 08/06/2018, 2:43 PM Pager: [phone]

## 2018-08-07 NOTE — Progress Notes (Signed)
  Date: 08/07/2018  Patient name: Cody Faulkner  Medical record number: [id number]  Date of birth: September 17, 1945   This patient's plan of care was discussed with the house staff. Please see Dr. Nelma Rothman note for complete details. I concur with his findings and plan.   Sid Falcon, MD 08/07/2018, 11:53 AM

## 2018-08-07 NOTE — Progress Notes (Signed)
   Subjective: Patient was sitting upright eating breakfast today upon entering the room.  He denies any acute concerns and is in agreement with the plan to continue with [facility] psych bed placement.  Objective:  Vital signs in last 24 hours: Vitals:   08/06/18 0501 08/06/18 0829 08/06/18 1547 08/06/18 2059  BP: 131/60  130/64 (!) 98/57  Pulse: (!) 42  (!) 43 (!) 44  Resp: 18  18 18   Temp: (!) 97.5 F (36.4 C)  (!) 97.5 F (36.4 C) 98.2 F (36.8 C)  TempSrc: Oral  Oral Oral  SpO2: 96% 98% 100% 96%  Weight:      Height:       General: A/O x4, in no acute distress, afebrile, nondiaphoretic Cardio: RRR, no mrg's Pulmonary: CTA bilaterally MSK: BLE nontender, nonedematous Psych: Appropriate affect, not depressed in appearance, engages well considering  Assessment/Plan:  Principal Problem:   Psychosis (HCC) Active Problems:   Hypothyroidism   AMS (altered mental status)   B12 deficiency  Mr. Mehra is a 73 yo M w/ PMH of PAF on eliquis, dilated cardiomyopathy s/p AICD, HTN, HLD, COPD, CKD3, OSA, and gout who was admitted for psychosis. He is awaiting the availability of a geri psych unit. No further updates on geri-psych bed placement at this time.   Visual, Auditory hallucinations 2/2 Depression w/ psychotic features Endorsing psychosis of 'the man' who is out to hurt him and his dog. Stable mood. No significant agitation. - C/w vitamin B12 1085mcg daily - C/w Risperdal 0.5mg  BID - C/w delirium precautions - Appreciate social work support in finding placement for geri-psych bed  DVT prophx:Eliquis Diet:Cardiac Bowel:Senokot Code:Full  Dispo: Anticipated discharge in approximately never day(s).   Kathi Ludwig, MD 08/07/2018, 7:08 AM Pager: Pager# [phone]

## 2018-08-08 ENCOUNTER — Other Ambulatory Visit: Payer: Self-pay | Admitting: Internal Medicine

## 2018-08-08 DIAGNOSIS — S0083XD Contusion of other part of head, subsequent encounter: Secondary | ICD-10-CM

## 2018-08-08 DIAGNOSIS — F323 Major depressive disorder, single episode, severe with psychotic features: Principal | ICD-10-CM

## 2018-08-08 DIAGNOSIS — R443 Hallucinations, unspecified: Secondary | ICD-10-CM

## 2018-08-08 NOTE — Progress Notes (Signed)
Spoke with MD about removing patient's PIV as patient is not receiving anything via iv. He stated it was ok to remove.    Hiram Comber, RN 08/08/2018 11:37 AM

## 2018-08-08 NOTE — Progress Notes (Signed)
CSW again spoke with [facility]-patient denied due to medical acuity: "recent falls and a lack of psych history".  Percell Locus Thijs Brunton LCSW [phone]

## 2018-08-08 NOTE — Progress Notes (Signed)
   Subjective: No overnight events. [name] reports that he "hears voices every once in a while." He denies visual hallucinations. He reports good appetite. All of his questions were addressed.   Objective:  Vital signs in last 24 hours: Vitals:   08/07/18 0738 08/07/18 0835 08/07/18 2003 08/08/18 0543  BP:  103/66 138/72 (!) 142/62  Pulse:  (!) 47 (!) 47 (!) 43  Resp:   16 15  Temp:   98.6 F (37 C) 98.2 F (36.8 C)  TempSrc:   Oral Oral  SpO2: 97%  96% 95%  Weight:      Height:       Gen: sitting up comfortably in a chair, no distress CV: RRR, no murmurs Pulm: CTAB Psych: Appropriate affect, engages well; can perform simple calculations   Assessment/Plan:  Principal Problem:   Psychosis ([place]) Active Problems:   Hypothyroidism   AMS (altered mental status)   B12 deficiency  [name] is a 73 yo M w/ PMH of PAF on eliquis, dilated cardiomyopathy s/p AICD, HTN, HLD, COPD, CKD3, OSA, and gout who was admitted for psychosis.He is awaiting the availability of a geri psych unit.   1. Depression with psychotic features - continues to have threatening auditory hallucinations but is aware they are not real; no SI/HI; appropriate mood and affect without agitation - he is on Risperdal 0.5 mg BID per psych recommendations - continue delirium precautions - appreciate social work's help with psych placement   Dispo: Anticipated discharge to inpatient psych when bed available.   Modena Nunnery D, DO 08/08/2018, 6:17 AM Pager: [phone]

## 2018-08-08 NOTE — Progress Notes (Signed)
Medicine attending: I examined this patient today together with resident physician Modena Nunnery and I concur with her evaluation and management plan. Patient known to me from recent earlier February admission when he fell getting into his truck and sustained a large hematoma left forehead, periorbital.  He was seen again in the emergency department on February 18 when he presented complaining of a 3 to 4-day history of hearing voices telling him to kill his dog and other family members.  CT brain showed no interval development of subdural hematoma or acute infarct.  Psychiatric consultant felt he warranted inpatient psychiatric hospitalization.  Risperdal started at that time. Patient is in good spirits and cooperative.  However he offers that he is still hearing voices.  He denies any visual hallucinations. He is on a low dose of Risperdal.  We will leave any medication adjustments or changes to psychiatry. Thyroid functions normal. No electrolyte abnormalities. He does have stage III chronic renal insufficiency.  Current creatinine 1.4 with values as high as 2.0 intermittent over the last 5 years. He has asymptomatic bradycardia.  Status post previous pacemaker/defibrillator for ventricular tachycardia. He is clinically stable waiting for a appropriate geriatric psychiatric bed.

## 2018-08-09 DIAGNOSIS — R443 Hallucinations, unspecified: Secondary | ICD-10-CM

## 2018-08-09 DIAGNOSIS — I878 Other specified disorders of veins: Secondary | ICD-10-CM

## 2018-08-09 DIAGNOSIS — Z8679 Personal history of other diseases of the circulatory system: Secondary | ICD-10-CM

## 2018-08-09 DIAGNOSIS — M170 Bilateral primary osteoarthritis of knee: Secondary | ICD-10-CM

## 2018-08-09 LAB — CBC
HCT: 34.1 % — ABNORMAL LOW (ref 39.0–52.0)
Hemoglobin: 11 g/dL — ABNORMAL LOW (ref 13.0–17.0)
MCH: 28.8 pg (ref 26.0–34.0)
MCHC: 32.3 g/dL (ref 30.0–36.0)
MCV: 89.3 fL (ref 80.0–100.0)
Platelets: 191 10*3/uL (ref 150–400)
RBC: 3.82 MIL/uL — ABNORMAL LOW (ref 4.22–5.81)
RDW: 14.8 % (ref 11.5–15.5)
WBC: 4.6 10*3/uL (ref 4.0–10.5)
nRBC: 0 % (ref 0.0–0.2)

## 2018-08-09 LAB — BASIC METABOLIC PANEL WITH GFR
Anion gap: 7 (ref 5–15)
BUN: 26 mg/dL — ABNORMAL HIGH (ref 8–23)
CO2: 23 mmol/L (ref 22–32)
Calcium: 8.9 mg/dL (ref 8.9–10.3)
Chloride: 105 mmol/L (ref 98–111)
Creatinine, Ser: 1.3 mg/dL — ABNORMAL HIGH (ref 0.61–1.24)
GFR calc Af Amer: >60 mL/min
GFR calc non Af Amer: 54 mL/min — ABNORMAL LOW
Glucose, Bld: 92 mg/dL (ref 70–99)
Potassium: 4.2 mmol/L (ref 3.5–5.1)
Sodium: 135 mmol/L (ref 135–145)

## 2018-08-09 NOTE — Care Management Important Message (Signed)
Important Message  Patient Details  Name: Cody Faulkner MRN: [id number] Date of Birth: May 15, 1946   Medicare Important Message Given:  Yes  Patient did not want to sign the IM nor did he want another copy   Orbie Pyo 08/09/2018, 3:48 PM

## 2018-08-09 NOTE — Progress Notes (Signed)
   Subjective: No overnight events. He denies hearing voices today. He denies visual hallucinations. He denies any complaints at this time. We discussed that we are continuing to work on a facility for him to go to to continue more specialized psychiatric treatment.   Objective:  Vital signs in last 24 hours: Vitals:   08/08/18 2108 08/08/18 2150 08/09/18 0433 08/09/18 0500  BP: (!) 145/68  (!) 149/87   Pulse: (!) 47  (!) 51   Resp: 15     Temp: 98 F (36.7 C)  97.8 F (36.6 C)   TempSrc:      SpO2: 100% 98% 100%   Weight:    89.8 kg  Height:       Gen: lying comfortably in bed, no distress CV: RRR, no murmurs Pulm: CTAB, normal effort on room air  Ext: advanced venous stasis changes Psych: appropriate affect, engages well, can name his family members, can perform simple calculations, unable to perform simple spelling (patient reports he did not go to school and can't spell)  Assessment/Plan:  Principal Problem:   Psychosis ([place]) Active Problems:   Hypothyroidism   AMS (altered mental status)   B12 deficiency   Auditory hallucinations  Mr. Laraia is a 73 yo M w/ PMH of PAF on eliquis, dilated cardiomyopathy s/p AICD,HTN,HLD, COPD, CKD3,OSA,and gout who was admitted forpsychosis.He is awaiting the availability of a geri psych unit.  1. Depression with psychotic features - continues to have intermittent threatening auditory hallucinations - this morning he has appropriate mood and affect without agitation or active hallucinations  - he is on Risperdal 0.5 mg BID per psych recommendations - continue delirium precautions - appreciate social work's help with psych placement; most recent facility declined admission due to medical acuity. However, he has had unremarkable medical work-up with most recent admission for falls. CT head did not show development of subdural or acute infarct. Patient has asymptomatic bradycardia, s/p pacemaker/defibrillator for ventricular  tachycardia.    Dispo: Anticipated discharge to inpatient geriatric psych when bed available.   Delice Bison, DO 08/09/2018, 6:33 AM Pager: [phone]

## 2018-08-09 NOTE — Progress Notes (Signed)
Medicine attending: I examined this patient today together with resident physician Modena Nunnery and I concur with her evaluation and management plan. As stated, he denies hearing voices today.  Physical exam stable. We still seeking a geriatric psychiatry bed for him. It is true that he is unsteady on his feet and has had a number of falls.  This was evaluated thoroughly during recent February 2020 admission.  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  If anything, this situation is best managed in a supervised living environment. With respect to his psychiatric history: Whether it is a new problem or not is irrelevant to the discussion.  The fact that he is hearing voices which are prompting him to do bad things is the definition of psychosis.  This also needs to be treated appropriately and monitored in a supervised living situation.  Psychiatric consultant this admission recommended "inpatient psychiatric hospitalization given active psychosis."

## 2018-08-09 NOTE — Progress Notes (Signed)
Physical Therapy Treatment Patient Details Name: Cody Faulkner MRN: [id number] DOB: 1946/01/19 Today's Date: 08/09/2018    History of Present Illness 73 year old man with a history of CAD and ICD/defibrillator, paroxysmal A. fib, CKD 3, gout, and hypothyroidism admitted from his nursing home with hallucinations.    PT Comments    Patient progressing towards goals. Patient increased distance with RW and min guardA without requiring standing rest break. Educated and reviewed seated HEP for LE strengthening and knee pain. Patient will continue to benefit from acute physical therapy to maximize independence and safety with functional mobility.    Follow Up Recommendations  Other (comment)([facility])     Equipment Recommendations  None recommended by PT    Recommendations for Other Services       Precautions / Restrictions Precautions Precautions: Fall Restrictions Weight Bearing Restrictions: No    Mobility  Bed Mobility               General bed mobility comments: in chair on arrival  Transfers Overall transfer level: Needs assistance Equipment used: Rolling walker (2 wheeled) Transfers: Sit to/from Stand Sit to Stand: Min guard         General transfer comment: Patient required min guard to stand with use of RW for safety. Demonstrated safe hand placement with use of RW.  Ambulation/Gait Ambulation/Gait assistance: Min guard Gait Distance (Feet): 400 Feet Assistive device: Rolling walker (2 wheeled) Gait Pattern/deviations: Step-through pattern;Decreased step length - right;Decreased step length - left;Decreased stride length;Shuffle Gait velocity: decreased Gait velocity interpretation: <1.8 ft/sec, indicate of risk for recurrent falls General Gait Details: Patient ambulated with min guard and use of RW for safety. Patient did not require a rest break this session. Patient with increased BLE foot drag with increased fatigue during ambulation. Verbal cues for upright  posture and for bilateral foot clearance during gait.     Stairs             Wheelchair Mobility    Modified Rankin (Stroke Patients Only)       Balance Overall balance assessment: Needs assistance Sitting-balance support: Feet supported;No upper extremity supported Sitting balance-[name]: Good     Standing balance support: Bilateral upper extremity supported Standing balance-[name]: Poor Standing balance comment: dependent on RW for support                            Cognition Arousal/Alertness: Awake/alert Behavior During Therapy: WFL for tasks assessed/performed Overall Cognitive Status: Within Functional Limits for tasks assessed                                        Exercises General Exercises - Lower Extremity Long Arc Quad: AROM;Both;10 reps;Seated Hip Flexion/Marching: AROM;Both;10 reps;Seated    General Comments        Pertinent Vitals/Pain Pain Assessment: Faces Faces Pain Scale: Hurts little more Pain Location: bilateral knees L>R Pain Descriptors / Indicators: Aching;Discomfort Pain Intervention(s): Limited activity within patient's tolerance;Monitored during session    Home Living                      Prior Function            PT Goals (current goals can now be found in the care plan section) Acute Rehab PT Goals Patient Stated Goal: agreeable to walk PT Goal Formulation: With patient  Time For Goal Achievement: 08/12/18 Potential to Achieve Goals: Good Progress towards PT goals: Progressing toward goals    Frequency    Min 2X/week      PT Plan Current plan remains appropriate    Co-evaluation              AM-PAC PT "6 Clicks" Mobility   Outcome Measure  Help needed turning from your back to your side while in a flat bed without using bedrails?: A Little Help needed moving from lying on your back to sitting on the side of a flat bed without using bedrails?: A Little Help  needed moving to and from a bed to a chair (including a wheelchair)?: A Little Help needed standing up from a chair using your arms (e.g., wheelchair or bedside chair)?: A Little Help needed to walk in hospital room?: A Little Help needed climbing 3-5 steps with a railing? : A Little 6 Click Score: 18    End of Session Equipment Utilized During Treatment: Gait belt Activity Tolerance: Patient tolerated treatment well Patient left: in bed;with call bell/phone within reach;with chair alarm set Nurse Communication: Mobility status PT Visit Diagnosis: Unsteadiness on feet (R26.81);Muscle weakness (generalized) (M62.81);Difficulty in walking, not elsewhere classified (R26.2);Pain;History of falling (Z91.81) Pain - Right/Left: Left Pain - part of body: Knee     Time: 7622-6333 PT Time Calculation (min) (ACUTE ONLY): 18 min  Charges:  $Gait Training: 8-22 mins                     Erick Blinks, SPT  Erick Blinks 08/09/2018, 5:40 PM

## 2018-08-09 NOTE — Consult Note (Addendum)
[facility] Psych Consult Progress Note  08/09/2018 1:28 PM KARTER HELLMER  MRN:  [id number] Subjective:   Mr. Smigiel was last seen on 2/19 for psychosis. He reported CAH to harm his family and dog and paranoia that had worsened over the past 2-3 months. Today, he reports that his mood is good. He denies SI, HI or AVH. He denies AH for 2 days. He reports no problems with sleep or appetite.   Principal Problem: Psychosis ([place]) Diagnosis:  Principal Problem:   Psychosis ([place]) Active Problems:   Hypothyroidism   AMS (altered mental status)   B12 deficiency   Auditory hallucinations  Total Time spent with patient: 15 minutes  Past Psychiatric History: Denies   Past Medical History:  Past Medical History:  Diagnosis Date  . Asthma    Albuterol prn;Symbicort daily  . Atrial fibrillation ([place])    takes COumadin daily  . Bruises easily    d/t being on Coumadin  . CAD (coronary artery disease)    predominantly single vessel  . Cardiomyopathy, ischemic   . Cataracts, bilateral to be removed in Aug 2015  . Chronic kidney disease 1976   S/P nephrectomy  . Constipation    takes Colace daily as needed  . Depression   . Dilated cardiomyopathy ([place])    s/p defibrillator [employer]onic EnTrust [phone]  . Dizziness    occasionally  . GERD (gastroesophageal reflux disease)    takes OTC med if needed  . Gout    takes Allopurinol daily  . Heart failure ([place])    NYHFA     CLASS 3  . History of hyperthyroidism   . Hyperlipidemia    takes Fenofibrate daily  . Hypertension    takes Bisoprolol daily as well as Imdur  . Hypothyroidism    takes Synthroid daily  . Joint pain   . Joint swelling   . Left knee DJD    needs surgery  . Myocardial infarction ([place]) 2007  . OSA (obstructive sleep apnea)    "don't wear mask; can't afford one" (12/20/2013)  . Pacemaker   . Peripheral edema    takes Furosemide daily  . Pneumonia, community acquired last time 2014   "get it ~ q yr; haven't had it yet in 2015"  (12/20/2013)  . Seasonal allergies    takes Claritin daily  . Shortness of breath    with exertion  . Solitary kidney 06/05/2006  . Urinary frequency   . Urinary incontinence   . Urinary urgency   . Ventricular tachycardia [facility])    s/p defibrillator-Medtronic EnTrust [phone]    Past Surgical History:  Procedure Laterality Date  . CARDIAC CATHETERIZATION  2007   60% Stenosis mid-CFX  . CARDIAC DEFIBRILLATOR PLACEMENT  2007   Medtronic EnTrust [phone]  . CARDIAC DEFIBRILLATOR REMOVAL  12/20/2013  . CARDIOVERSION  06/18/2011   Procedure: CARDIOVERSION;  Surgeon: Kerry Hough., MD;  Location: [place];  Service: Cardiovascular;  Laterality: N/A;  . CARDIOVERSION N/A 12/06/2012   Procedure: CARDIOVERSION;  Surgeon: Jacolyn Reedy, MD;  Location: [place];  Service: Cardiovascular;  Laterality: N/A;  . COLONOSCOPY    . ICD GENERATOR CHANGE  07/2012  . ICD LEAD REMOVAL Left 12/20/2013   Procedure: ICD LEAD REMOVAL//EXTRACTION AND PLACEMENT OF TEMP/PERM ATRIAL LEAD;  Surgeon: Evans Lance, MD;  Location: [place];  Service: Cardiovascular;  Laterality: Left;  . IMPLANTABLE CARDIOVERTER DEFIBRILLATOR IMPLANT N/A 01/25/2014   Procedure: IMPLANTABLE CARDIOVERTER DEFIBRILLATOR IMPLANT;  Surgeon: Evans Lance,  MD;  Location: [place] CATH LAB;  Service: Cardiovascular;  Laterality: N/A;  . IMPLANTABLE CARDIOVERTER DEFIBRILLATOR REVISION N/A 07/13/2012   Procedure: IMPLANTABLE CARDIOVERTER DEFIBRILLATOR REVISION;  Surgeon: Deboraha Sprang, MD;  Location: [facility] CATH LAB;  Service: Cardiovascular;  Laterality: N/A;  . INSERT / REPLACE / REMOVE PACEMAKER  12/20/2013  . KNEE ARTHROSCOPY Left   . MULTIPLE TOOTH EXTRACTIONS     "top's are all gone; took some off the bottom too"  . NEPHRECTOMY  1976   Right  . TEE WITHOUT CARDIOVERSION  07/13/2012   Procedure: TRANSESOPHAGEAL ECHOCARDIOGRAM (TEE);  Surgeon: Lelon Perla, MD;  Location: [facility] ENDOSCOPY;  Service: Cardiovascular;  Laterality: N/A;   Family History:   Family History  Problem Relation Age of Onset  . Heart disease Other        uncle  . Diabetes Other        uncle   Family Psychiatric  History: Denies  Social History:  Social History   Substance and Sexual Activity  Alcohol Use Yes   Comment: 12/20/2013 "might have drank a couple beers before; never really drank"     Social History   Substance and Sexual Activity  Drug Use No    Social History   Socioeconomic History  . Marital status: Single    Spouse name: Not on file  . Number of children: Not on file  . Years of education: Not on file  . Highest education level: Not on file  Occupational History  . Occupation: disabled  Social Needs  . Financial resource strain: Not on file  . Food insecurity:    Worry: Not on file    Inability: Not on file  . Transportation needs:    Medical: Not on file    Non-medical: Not on file  Tobacco Use  . Smoking status: Never Smoker  . Smokeless tobacco: Never Used  Substance and Sexual Activity  . Alcohol use: Yes    Comment: 12/20/2013 "might have drank a couple beers before; never really drank"  . Drug use: No  . Sexual activity: Not Currently    Birth control/protection: None  Lifestyle  . Physical activity:    Days per week: Not on file    Minutes per session: Not on file  . Stress: Not on file  Relationships  . Social connections:    Talks on phone: Not on file    Gets together: Not on file    Attends religious service: Not on file    Active member of club or organization: Not on file    Attends meetings of clubs or organizations: Not on file    Relationship status: Not on file  Other Topics Concern  . Not on file  Social History Narrative   Never married.  Worked previously in [employer].    Sleep: Good  Appetite:  Good  Current Medications: Current Facility-Administered Medications  Medication Dose Route Frequency Provider Last Rate Last Dose  . acetaminophen (TYLENOL) tablet 650 mg  650 mg Oral Q8H  [name], MD   650 mg at 08/09/18 0530  . allopurinol (ZYLOPRIM) tablet 100 mg  100 mg Oral Daily [name], MD   100 mg at 08/09/18 0859  . amiodarone (PACERONE) tablet 200 mg  200 mg Oral Daily [name], MD   200 mg at 08/09/18 0859  . apixaban (ELIQUIS) tablet 5 mg  5 mg Oral BID [name], MD   5 mg at 08/09/18 0900  . diclofenac  sodium (VOLTAREN) 1 % transdermal gel 2 g  2 g Topical QID [name], MD   2 g at 08/09/18 0900  . fenofibrate tablet 160 mg  160 mg Oral Daily [name], MD   160 mg at 08/09/18 0858  . Influenza vac split quadrivalent PF (FLUZONE HIGH-DOSE) injection 0.5 mL  0.5 mL Intramuscular Tomorrow-1000 Oda Kilts, MD      . ipratropium-albuterol (DUONEB) 0.5-2.5 (3) MG/3ML nebulizer solution 3 mL  3 mL Nebulization Q6H PRN [name], MD      . isosorbide mononitrate (IMDUR) 24 hr tablet 60 mg  60 mg Oral Daily [name], MD   60 mg at 08/09/18 0858  . levothyroxine (SYNTHROID, LEVOTHROID) tablet 150 mcg  150 mcg Oral Q0600 [name], MD   150 mcg at 08/09/18 0530  . mometasone-formoterol (DULERA) 200-5 MCG/ACT inhaler 2 puff  2 puff Inhalation BID [name], MD   2 puff at 08/09/18 0759  . ondansetron (ZOFRAN) tablet 4 mg  4 mg Oral Q6H PRN [name], MD       Or  . ondansetron [facility]) injection 4 mg  4 mg Intravenous Q6H PRN [name], MD      . pantoprazole (PROTONIX) EC tablet 40 mg  40 mg Oral Daily [name], MD   40 mg at 08/09/18 0859  . ramelteon (ROZEREM) tablet 8 mg  8 mg Oral QHS PRN [name], MD   8 mg at 08/04/18 2113  . risperiDONE (RISPERDAL) tablet 0.5 mg  0.5 mg Oral BID [name], MD   0.5 mg at 08/09/18 0859  . senna-docusate (Senokot-S) tablet 1 tablet  1 tablet Oral QHS PRN [name], MD      . sodium chloride flush (NS) 0.9 % injection 3 mL  3 mL Intravenous Q12H [name], MD   3 mL at 08/08/18 0801  . sodium chloride flush  (NS) 0.9 % injection 3 mL  3 mL Intravenous PRN [name], MD   3 mL at 07/31/18 0942  . vitamin B-12 (CYANOCOBALAMIN) tablet 1,000 mcg  1,000 mcg Oral Daily [name], MD   1,000 mcg at 08/09/18 7867    Lab Results:  Results for orders placed or performed during the hospital encounter of 07/26/18 (from the past 48 hour(s))  CBC     Status: Abnormal   Collection Time: 08/09/18 11:05 AM  Result Value Ref Range   WBC 4.6 4.0 - 10.5 K/uL   RBC 3.82 (L) 4.22 - 5.81 MIL/uL   Hemoglobin 11.0 (L) 13.0 - 17.0 g/dL   HCT 34.1 (L) 39.0 - 52.0 %   MCV 89.3 80.0 - 100.0 fL   MCH 28.8 26.0 - 34.0 pg   MCHC 32.3 30.0 - 36.0 g/dL   RDW 14.8 11.5 - 15.5 %   Platelets 191 150 - 400 K/uL   nRBC 0.0 0.0 - 0.2 %    Comment: Performed at [facility], [place] [address], [place], [place] [address]  Basic metabolic panel     Status: Abnormal   Collection Time: 08/09/18 11:05 AM  Result Value Ref Range   Sodium 135 135 - 145 mmol/L   Potassium 4.2 3.5 - 5.1 mmol/L   Chloride 105 98 - 111 mmol/L   CO2 23 22 - 32 mmol/L   Glucose, Bld 92 70 - 99 mg/dL   BUN 26 (H) 8 - 23 mg/dL   Creatinine, Ser 1.30 (H) 0.61 -  1.24 mg/dL   Calcium 8.9 8.9 - 10.3 mg/dL   GFR calc non Af Amer 54 (L) >60 mL/min   GFR calc Af Amer >60 >60 mL/min   Anion gap 7 5 - 15    Comment: Performed at [facility] [address] [place], [place] [address]    Blood Alcohol level:  Lab Results  Component Value Date   ETH <10 07/26/2018    Physical Findings: AIMS:  , ,  ,  ,    CIWA:    COWS:     Musculoskeletal: Strength & Muscle Tone: within normal limits Gait & Station: UTA since patient is sitting in a chair. Patient leans: N/A  Psychiatric Specialty Exam: Physical Exam  Nursing note and vitals reviewed. Constitutional: He is oriented to person, place, and time. He appears well-developed and well-nourished.  HENT:  Head: Normocephalic and atraumatic.  Neck: Normal range of motion.   Respiratory: Effort normal.  Musculoskeletal: Normal range of motion.  Neurological: He is alert and oriented to person, place, and time.  Psychiatric: He has a normal mood and affect. His speech is normal and behavior is normal. Judgment and thought content normal. Cognition and memory are normal.    Review of Systems  Psychiatric/Behavioral: Negative for depression, hallucinations and suicidal ideas. The patient does not have insomnia.   All other systems reviewed and are negative.   Blood pressure (!) 146/70, pulse 80, temperature 97.9 F (36.6 C), temperature source Oral, resp. rate 19, height 5\' 9"  (1.753 m), weight 89.8 kg, SpO2 94 %.Body mass index is 29.24 kg/m.  General Appearance: Fairly Groomed, elderly, Caucasian male, wearing a hospital gown who is sitting in a chair and watching television. NAD.   Eye Contact:  Good  Speech:  Clear and Coherent and Normal Rate  Volume:  Normal  Mood:  Euthymic  Affect:  Appropriate and Congruent  Thought Process:  Goal Directed, Linear and Descriptions of Associations: Intact  Orientation:  Full (Time, Place, and Person)  Thought Content:  Logical  Suicidal Thoughts:  No  Homicidal Thoughts:  No  Memory:  Immediate;   Good Recent;   Good Remote;   Good  Judgement:  Fair  Insight:  Fair  Psychomotor Activity:  Normal  Concentration:  Concentration: Good and Attention Span: Good  Recall:  Good  Fund of Knowledge:  NA  Language:  Good  Akathisia:  No  Handed:  Right  AIMS (if indicated):   N/A  Assets:  Communication Skills Housing Social Support  ADL's:  Impaired  Cognition:  WNL  Sleep:   Okay    Assessment:  MACOY RODWELL is a 73 y.o. male who was admitted with psychosis. Today, he denies SI, HI or AVH. He does not appear to be responding to internal stimuli. He is oriented to person, place and time. He appears to have good effect with Risperdal for psychosis. His affect is bright. The etiology of his psychosis is still  unclear. Differential diagnosis includes depression with psychosis versus an underlying dementia (although he does not have overt cognitive deficits) versus vitamin B12 deficiency (77 on admission and currently receiving supplementation). He no longer warrants inpatient psychiatric hospitalization. Please have SW provide patient with outpatient mental health resources.    Treatment Plan Summary:  -Continue Risperdal 0.5 mg BID for psychosis.  -EKG reviewed and QTc 410 on 2/20. Please closely monitor when starting or increasing QTc prolonging agents.  -Please have SW provide patient with outpatient mental  health resources.  -Will sign off on patient at this time. Please consult psychiatry again as needed.  Faythe Dingwall, DO 08/09/2018, 1:28 PM   Addendum: Spoke to primary team and patient is still having hallucinations although he reported to this notewriter none for two days. He lives alone and has not been adequately caring for himself so he is a high risk for harming himself in setting of active hallucinations if discharged home with outpatient follow up. Recommend inpatient psychiatric hospitalization for further stabilization and treatment. Recommend increasing Risperdal 0.5 mg BID to 0.5 mg q am and 1 mg qhs. Psychiatry will follow as needed.   Buford Dresser, DO 08/10/18 2:44 PM

## 2018-08-10 MED ORDER — RISPERIDONE 0.5 MG PO TABS
0.5000 mg | ORAL_TABLET | Freq: Every morning | ORAL | Status: DC
  Administered 2018-08-11 – 2018-08-23 (×13): 0.5 mg via ORAL
  Filled 2018-08-10 (×13): qty 1

## 2018-08-10 MED ORDER — RISPERIDONE 0.5 MG PO TABS
1.0000 mg | ORAL_TABLET | Freq: Every day | ORAL | Status: DC
  Administered 2018-08-10 – 2018-08-22 (×13): 1 mg via ORAL
  Filled 2018-08-10 (×13): qty 2

## 2018-08-10 NOTE — Progress Notes (Signed)
Medicine attending: I personally evaluated this patient at the bedside together with resident physician Modena Nunnery and I concur with her evaluation and management plan. As stated in her note, patient does admit again to hearing voices last night.  We are concerned because he lives alone in a trailer.  Only family support are 2 cousins who look in on him occasionally.  He does not qualify for skilled nursing. I discussed his situation with psychiatry, Dr. Mariea Clonts.  Psychiatrist concurs that in view of persistent, intermittent auditory hallucinations, geriatric psychiatry inpatient evaluation and management would be appropriate.  She will write a follow-up note.

## 2018-08-10 NOTE — Progress Notes (Signed)
Assumed care of  Patient from [place], [place]. Agree with Previous, RN assessment.

## 2018-08-10 NOTE — NC FL2 (Signed)
[place] LEVEL OF CARE SCREENING TOOL     IDENTIFICATION  Patient Name: Cody Faulkner Birthdate: Jul 16, 1945 Sex: male Admission Date (Current Location): 07/26/2018  [facility] and [place] Number:  Herbalist and Address:  [facility] [place]. [facility], [place] [address], [place], [place] [address]      Provider Number: [id number]  Attending Physician Name and Address:  Annia Belt, MD  Relative Name and Phone Number:  Mearle Drew [phone]    Current Level of Care: SNF Recommended Level of Care: [place] Prior Approval Number:    Date Approved/Denied:   PASRR Number: [id number]  Discharge Plan: SNF    Current Diagnoses: Patient Active Problem List   Diagnosis Date Noted  . Primary osteoarthritis of both knees   . History of ventricular tachycardia   . Hallucinations   . B12 deficiency 07/27/2018  . Psychosis ([place])   . AMS (altered mental status) 07/26/2018  . Chronic gout of left knee   . Unsteady gait   . Primary osteoarthritis of left knee   . HTN (hypertension), benign   . Weakness 07/12/2018  . Delirium 05/30/2018  . On amiodarone therapy 05/18/2018  . Chronic anticoagulation- Coumadin 12/23/2013  . Sinus node dysfunction ([place]) 12/21/2013  . Paroxysmal ventricular tachycardia ([place]) 12/21/2013  . ICD (implantable cardioverter-defibrillator) lead failure 12/20/2013  . Obesity hypoventilation syndrome ([place]) 12/07/2012  . History of hyperthyroidism   . CKD (chronic kidney disease) stage 3, GFR 30-59 ml/min (HCC) 06/23/2011  . Morbid obesity   . Long term current use of anticoagulant therapy   . OSA (obstructive sleep apnea)   . Hypertensive heart disease with heart failure ([place])   . Solitary kidney   . Community acquired pneumonia 06/06/2011  . Acute encephalopathy 06/06/2011  . Hypothyroidism   . Mild persistent asthma   . PAF (paroxysmal atrial fibrillation) ([place])   . ICD (implantable  cardioverter-defibrillator) in place   . History of dilated cardiomyopathy- last EF 50-55% 2D July 2014   . Chronic combined systolic and diastolic heart failure ([place])   . CAD nonobstructive- 2007 11/12/2005    [place] RESPIRATION BLADDER Height & Weight     Self, Time, Situation, Place  Normal Continent Weight: 87.1 kg Height:  5\' 9"  (175.3 cm)  BEHAVIORAL SYMPTOMS/MOOD NEUROLOGICAL BOWEL NUTRITION STATUS      Continent Diet(Please see DC Summary)  AMBULATORY STATUS COMMUNICATION OF NEEDS Skin   Limited Assist Verbally Normal                       Personal Care Assistance Level of Assistance  Bathing, Feeding, Dressing Bathing Assistance: Limited assistance Feeding assistance: Independent Dressing Assistance: Limited assistance Total Care Assistance: Limited assistance   Functional Limitations Info  Sight, Hearing, Speech Sight Info: Impaired Hearing Info: Impaired Speech Info: Adequate    SPECIAL CARE FACTORS FREQUENCY  PT (By licensed PT), OT (By licensed OT)     PT Frequency: 3x/week OT Frequency: 3x/week            Contractures Contractures Info: Not present    Additional Factors Info  Code Status, Allergies, Psychotropic Code Status Info: Full Allergies Info: Methimazole Psychotropic Info: Risperdal         Current Medications (08/10/2018):  This is the current hospital active medication list Current Facility-Administered Medications  Medication Dose Route Frequency Provider Last Rate Last Dose  . acetaminophen (TYLENOL) tablet 650 mg  650 mg Oral Q8H Lee,  Dimple Nanas, MD   650 mg at 08/10/18 2956  . allopurinol (ZYLOPRIM) tablet 100 mg  100 mg Oral Daily [name], MD   100 mg at 08/10/18 0802  . amiodarone (PACERONE) tablet 200 mg  200 mg Oral Daily [name], MD   200 mg at 08/10/18 2130  . apixaban (ELIQUIS) tablet 5 mg  5 mg Oral BID [name], MD   5 mg at 08/10/18 0802  . diclofenac sodium (VOLTAREN) 1 % transdermal  gel 2 g  2 g Topical QID [name], MD   2 g at 08/10/18 0803  . fenofibrate tablet 160 mg  160 mg Oral Daily [name], MD   160 mg at 08/10/18 0803  . Influenza vac split quadrivalent PF (FLUZONE HIGH-DOSE) injection 0.5 mL  0.5 mL Intramuscular Tomorrow-1000 Oda Kilts, MD      . ipratropium-albuterol (DUONEB) 0.5-2.5 (3) MG/3ML nebulizer solution 3 mL  3 mL Nebulization Q6H PRN [name], MD      . isosorbide mononitrate (IMDUR) 24 hr tablet 60 mg  60 mg Oral Daily [name], MD   60 mg at 08/10/18 0802  . levothyroxine (SYNTHROID, LEVOTHROID) tablet 150 mcg  150 mcg Oral Q0600 [name], MD   150 mcg at 08/10/18 8657  . mometasone-formoterol (DULERA) 200-5 MCG/ACT inhaler 2 puff  2 puff Inhalation BID [name], MD   2 puff at 08/10/18 [phone]  . ondansetron (ZOFRAN) tablet 4 mg  4 mg Oral Q6H PRN [name], MD       Or  . ondansetron [facility]) injection 4 mg  4 mg Intravenous Q6H PRN [name], MD      . pantoprazole (PROTONIX) EC tablet 40 mg  40 mg Oral Daily [name], MD   40 mg at 08/10/18 0802  . ramelteon (ROZEREM) tablet 8 mg  8 mg Oral QHS PRN [name], MD   8 mg at 08/04/18 2113  . risperiDONE (RISPERDAL) tablet 0.5 mg  0.5 mg Oral BID [name], MD   0.5 mg at 08/10/18 0802  . senna-docusate (Senokot-S) tablet 1 tablet  1 tablet Oral QHS PRN [name], MD      . sodium chloride flush (NS) 0.9 % injection 3 mL  3 mL Intravenous Q12H [name], MD   3 mL at 08/08/18 0801  . sodium chloride flush (NS) 0.9 % injection 3 mL  3 mL Intravenous PRN [name], MD   3 mL at 07/31/18 0942  . vitamin B-12 (CYANOCOBALAMIN) tablet 1,000 mcg  1,000 mcg Oral Daily [name], MD   1,000 mcg at 08/10/18 0802     Discharge Medications: Please see discharge summary for a list of discharge medications.  Relevant Imaging Results:  Relevant Lab Results:   Additional Information SSN  [id number]  Benard Halsted, LCSW

## 2018-08-10 NOTE — Progress Notes (Signed)
   Subjective: No overnight events. Mr. Strauch reports that he is alright this morning. He reports that he heard voices last night. He said the voices "were picking on the dogs because everyone around here knows he thinks the world of dogs." He states the the voices do not threaten him. He calls his cousin every morning and tells him about it. His cousin tells him to ignore the voices, so that's what he tries to do. He has gotten out of bed and walked without issue. His knee feels improved.   Objective:  Vital signs in last 24 hours: Vitals:   08/10/18 0511 08/10/18 0631 08/10/18 0854 08/10/18 1414  BP: (!) 166/68   120/67  Pulse: (!) 41   (!) 45  Resp: 19   16  Temp: 97.7 F (36.5 C)   97.8 F (36.6 C)  TempSrc:    Oral  SpO2: 97%  97% 95%  Weight:  87.1 kg    Height:       Gen: sitting up comfortably in a chair, no distress CV: RRR, no murmurs Pulm: CTAB, normal effort on room air Psych: alert and oriented, appropriate affect, engages well  Assessment/Plan:  Principal Problem:   Psychosis (HCC) Active Problems:   Hypothyroidism   AMS (altered mental status)   B12 deficiency   Hallucinations   Primary osteoarthritis of both knees   History of ventricular tachycardia  Mr. Rippeon is a 73 yo M w/ PMH of PAF on eliquis, dilated cardiomyopathy s/p AICD,HTN,HLD, COPD, CKD3,OSA,and gout who was admitted forpsychosis.He is awaiting the availability of a geri psych unit.  1. Depression with psychotic features - continues to have auditory hallucinations - he is on Risperdal 0.5 mg BID  - psych has evaluated and feel that he no longer needs inpatient psychiatric care - I am concerned about sending Mr. Graffam home, as he lives alone in a trailer and only has a cousin who periodically checks in on him.  - greatly appreciate CSW evaluating other long-term living options for him; will follow-up once she has discussed with his family  Dispo: Will wait to hear from [place] regarding  long-term care options. Do not think he is safe to be discharged home.   Modena Nunnery D, DO 08/10/2018, 2:24 PM Pager: [phone]

## 2018-08-10 NOTE — Progress Notes (Signed)
CSW received consult that patient had been cleared by psychiatry and no longer requires inpatient psych placement. CSW contacted [facility] to confirm that they can accept patient back. They are now stating that they are unable to meet patient's needs and cannot accept him back. CSW in touch with patient's family regarding alternate SNF (if patient qualifies) and long term planning.   Percell Locus Bishop Vanderwerf LCSW [phone]

## 2018-08-10 NOTE — Progress Notes (Signed)
CSW spoke with [name] (his father is patient's 1st cousin and they share POA). He shares that patient is still hallucinating and is unable to return home alone. He states that patient knows how to answer psychiatry questions so that people will not think he is "crazy". [name] reports that patient is still telling them that there is a man that is killing his family members and their dog. CSW explained that we can alert psychiatry but that the family needs to be planning for placement for long term if patient continues to have psychosis. Even if we are able to find a psych placement, they will only keep the patient for so long. [name] states that he will contact DSS to see the status of his Medicaid in case he needs ALF placement. He requests that the MD contact him if they need collateral information.   Percell Locus Anahlia Iseminger LCSW [phone]

## 2018-08-11 DIAGNOSIS — R32 Unspecified urinary incontinence: Secondary | ICD-10-CM

## 2018-08-11 NOTE — Plan of Care (Signed)
  Problem: Education: Goal: Knowledge of General Education information will improve Description: Including pain rating scale, medication(s)/side effects and non-pharmacologic comfort measures Outcome: Progressing   Problem: Clinical Measurements: Goal: Ability to maintain clinical measurements within normal limits will improve Outcome: Progressing Goal: Will remain free from infection Outcome: Progressing Goal: Cardiovascular complication will be avoided Outcome: Progressing   

## 2018-08-11 NOTE — Progress Notes (Signed)
   Subjective: No overnight events. Cody Faulkner reports that he feels well this morning. His only concern today is urinary incontinence. He is accompanied by family, including his cousin Cody Faulkner. His family is concerned that Cody Faulkner believes that the voices he hears are real and doesn't realize that they are in his head. Cody Faulkner reports that the voices tell him about things that have happened in his past. He believes that these past events are happening in real time when he hears the voices. Per family, the voices sometimes tell Cody Faulkner that they are going to hurt his family members. He often calls his family member after he hears the voices to check in on them and make sure they are safe. His family appears to be very supportive and wants him to get the best care he can.   Objective:  Vital signs in last 24 hours: Vitals:   08/10/18 2139 08/11/18 0500 08/11/18 0532 08/11/18 1013  BP: (!) 170/61  (!) 125/55 (!) 113/43  Pulse: (!) 46  (!) 40 (!) 55  Resp: 18   18  Temp: 97.7 F (36.5 C)  (!) 97.5 F (36.4 C) 97.6 F (36.4 C)  TempSrc: Oral  Oral Oral  SpO2: 97%  99% 99%  Weight:  88.1 kg    Height:       Gen: sitting up in a chair, no distress Psych: alert and oriented x3, normal affect, engages well   Assessment/Plan:  Principal Problem:   Psychosis ([place]) Active Problems:   Hypothyroidism   AMS (altered mental status)   B12 deficiency   Hallucinations   Primary osteoarthritis of both knees   History of ventricular tachycardia  Cody Faulkner is a 73 yo M w/ PMH of PAF on eliquis, dilated cardiomyopathy s/p AICD,HTN,HLD, COPD, CKD3,OSA,and gout who was admitted forpsychosis.He is awaiting the availability of a geri psych unit.  1. Depression with psychotic features vs neuropsychiatric manifestations of underlying Dementia  - continues to have auditory hallucinations; based on descriptions from family, etiology could also include symptoms of Dementia - increased Risperdal to 0.5  mg in the morning and 1 mg qhs  - after further discussing with psych regarding continued hallucinations, feel that this does warrant inpatient geriatric psych; greatly appreciate their consult - will continue to work with CSW regarding placement   2. Urinary incontinence: - patient reports this new onset - given multiple CTs of the head that were unremarkable, suspicion for NPH is low. Unfortunately, his defibrillator is not MRI compatible. Reassuring that he has been working well with PT in terms of his gait and does not have any focal neurologic findings - He is afebrile without other signs to suggest infectious etiology - may also be due to underlying dementia; will continue to monitor   Dispo: Anticipated discharge to inpatient psych when bed available.   Modena Nunnery D, DO 08/11/2018, 1:43 PM Pager: [phone]

## 2018-08-11 NOTE — Progress Notes (Signed)
Physical Therapy Treatment Patient Details Name: Cody Faulkner MRN: [id number] DOB: [birth year] Today's Date: 08/11/2018    History of Present Illness Pt is a 73 y.o. male admitted 07/26/18 with hallucinations. Worked up for depression with psychotic features vs neuropsychiatric manifestations of underlying dementia. PMH includes CAD s/p ICD/defibrillator, afib, CKD3, gout.    PT Comments    Pt progressing well with mobility. Ambulating with RW at supervision to min guard-level; pt with improved awareness of gait mechanics and safety, only requiring intermittent cues for this. Remains motivated to participate in order to regain strength. Will continue to follow acutely.   Follow Up Recommendations  Other (comment)([facility])     Equipment Recommendations  None recommended by PT    Recommendations for Other Services       Precautions / Restrictions Precautions Precautions: Fall Precaution Comments: vision limited on L Restrictions Weight Bearing Restrictions: No    Mobility  Bed Mobility               General bed mobility comments: Received sitting in recliner  Transfers Overall transfer level: Needs assistance Equipment used: Rolling walker (2 wheeled)   Sit to Stand: Supervision            Ambulation/Gait Ambulation/Gait assistance: Min guard;Supervision Gait Distance (Feet): 400 Feet Assistive device: Rolling walker (2 wheeled) Gait Pattern/deviations: Step-through pattern;Decreased stride length;Antalgic;Decreased weight shift to right;Decreased dorsiflexion - right;Decreased dorsiflexion - left Gait velocity: Decreased Gait velocity interpretation: 1.31 - 2.62 ft/sec, indicative of limited community ambulator General Gait Details: Slow gait with RW and intermittent min guard for balance; pt tends to drag bilateral feet with toes pointing out, c/o L knee pain, but able to clear feet completely. Intermittent cues to maintain closer proximity to RW and upright  posture   Stairs             Wheelchair Mobility    Modified Rankin (Stroke Patients Only)       Balance Overall balance assessment: Needs assistance Sitting-balance support: Feet supported;No upper extremity supported Sitting balance-[name]: Good       Standing balance-[name]: Poor Standing balance comment: Reliant on UE support                            Cognition Arousal/Alertness: Awake/alert Behavior During Therapy: WFL for tasks assessed/performed Overall Cognitive Status: Within Functional Limits for tasks assessed                                 General Comments: Some potential delay in processing and poor insight into deficits, likely baseline cognition      Exercises      General Comments        Pertinent Vitals/Pain Pain Assessment: Faces Faces Pain Scale: Hurts a little bit Pain Location: L knee Pain Descriptors / Indicators: Aching;Discomfort;Constant Pain Intervention(s): Monitored during session;Limited activity within patient's tolerance    Home Living                      Prior Function            PT Goals (current goals can now be found in the care plan section) Acute Rehab PT Goals Patient Stated Goal: "I hope I'm walking better" PT Goal Formulation: With patient Time For Goal Achievement: 08/19/18 Potential to Achieve Goals: Good Progress towards PT goals: Progressing toward goals  Frequency    Min 2X/week      PT Plan Current plan remains appropriate    Co-evaluation              AM-PAC PT "6 Clicks" Mobility   Outcome Measure  Help needed turning from your back to your side while in a flat bed without using bedrails?: None Help needed moving from lying on your back to sitting on the side of a flat bed without using bedrails?: None Help needed moving to and from a bed to a chair (including a wheelchair)?: A Little Help needed standing up from a chair using your  arms (e.g., wheelchair or bedside chair)?: A Little Help needed to walk in hospital room?: A Little Help needed climbing 3-5 steps with a railing? : A Little 6 Click Score: 20    End of Session Equipment Utilized During Treatment: Gait belt Activity Tolerance: Patient tolerated treatment well Patient left: in chair;with call bell/phone within reach;with chair alarm set Nurse Communication: Mobility status PT Visit Diagnosis: Unsteadiness on feet (R26.81);Muscle weakness (generalized) (M62.81);Difficulty in walking, not elsewhere classified (R26.2);Pain;History of falling (Z91.81) Pain - Right/Left: Left Pain - part of body: Knee     Time: 2956-2130 PT Time Calculation (min) (ACUTE ONLY): 16 min  Charges:  $Gait Training: 8-22 mins                    Mabeline Caras, PT, DPT Acute Rehabilitation Services  Pager [phone] Office [place] 08/11/2018, 3:36 PM

## 2018-08-12 DIAGNOSIS — Z8639 Personal history of other endocrine, nutritional and metabolic disease: Secondary | ICD-10-CM

## 2018-08-12 NOTE — Care Management Important Message (Signed)
Important Message  Patient Details  Name: Cody Faulkner MRN: [id number] Date of Birth: Dec 16, 1945   Medicare Important Message Given:  Yes    Shelda Altes 08/12/2018, 1:49 PM

## 2018-08-12 NOTE — Progress Notes (Signed)
CSW renewed IVC. [employer] to serve patient.   Percell Locus Lochlann Mastrangelo LCSW [phone]

## 2018-08-12 NOTE — Plan of Care (Signed)

## 2018-08-12 NOTE — Progress Notes (Signed)
CSW sent updated referral to [facility] and [facility]. [facility] has denied patient due to him being a "fall risk". CSW stated that they were a geropsych facility and should be able to handle patient as he is supervision level only. She stated that they are gero, but do not accept patient's with fall risks as it is a liability. CSW requested a peer to peer, however, admissions stated that it had already gone to their medical director and there was no other avenue.   Patient will be placed on [facility] and CSW will continue to contact other facilities.   Cody Locus Emmersyn Kratzke LCSW [phone]

## 2018-08-12 NOTE — Progress Notes (Signed)
   Subjective: No overnight events. [name] reports that he is sleepy today. He peed in his bed last night, so he didn't sleep well. He reports that he has been itchy. He heard voices again last night. He says "they were just talking" and he tried to ignore them like his family told him. He turned up the volume on the tv to help drown out the voices.   Objective:  Vital signs in last 24 hours: Vitals:   08/11/18 2002 08/11/18 2104 08/12/18 0519 08/12/18 0825  BP:  126/65 (!) 156/80   Pulse:  (!) 43 (!) 52 (!) 51  Resp:   19 18  Temp:  (!) 97.5 F (36.4 C) 97.9 F (36.6 C)   TempSrc:  Oral Oral   SpO2: 98% 99% 96% 96%  Weight:   87.1 kg   Height:       Gen: sitting up in the chair comfortably, no distress CV: RRR, no murmurs Pulm: CTAB, normal effort on room air  Assessment/Plan:  Principal Problem:   Psychosis ([facility]) Active Problems:   Hypothyroidism   AMS (altered mental status)   B12 deficiency   Hallucinations   Primary osteoarthritis of both knees   History of ventricular tachycardia  1. Depression with psychotic features vs neuropsychiatric manifestations of underlying Dementia  -continues to have auditory hallucinations; based on descriptions from family, etiology could also include manifestations of Dementia - he certainly would benefit from formal geropsychiatric evaluation which psych consult agrees with; greatly appreciate their involvement in his care - increased Risperdal to 0.5 mg in the morning and 1 mg qhs  - appreciate social work's efforts in continuing to work on finding him a bed at a psychiatric facility. Unfortunately, the preferred geropsych facility in [place] will not accept the patient due to "fall risk." Patient has worked with PT throughout hospitalization and ambulates with minimal guard.   2. Urinary incontinence: - He is afebrile without other signs to suggest infectious etiology - likely due to underlying dementia; will continue to  monitor   Dispo: Anticipated discharge to inpatient psych when bed available.   Modena Nunnery D, DO 08/12/2018, 11:06 AM Pager: [phone]

## 2018-08-13 NOTE — Progress Notes (Signed)
   Subjective: No acute events overnight. Cody Faulkner was resting comfortably in his chair this morning. He states he mostly hears the voices at night, but finds that if he turns the TV up louder this helps. He understands that we are working on transferring him to a facility for further treatment and evaluation as to why he is hearing voices. Would like me to give an update to [name] if he stops by today which I told him I am happy to do. All other questions and concerns were addressed.   Objective:  Vital signs in last 24 hours: Vitals:   08/12/18 2125 08/13/18 0432 08/13/18 0643 08/13/18 0746  BP: (!) 142/58 (!) 171/78    Pulse: (!) 46 (!) 46    Resp: 16 (!) 8    Temp: 98.1 F (36.7 C) (!) 97.5 F (36.4 C)    TempSrc: Oral Oral    SpO2: 98% 99%  95%  Weight:   87.5 kg   Height:       General: pleasant, elderly gentleman sitting up in his chair in NAD Psych: appropriate mood and affect; engages well; normal thought content   Assessment/Plan:  Principal Problem:   Psychosis ([place]) Active Problems:   Hypothyroidism   AMS (altered mental status)   B12 deficiency   Hallucinations   Primary osteoarthritis of both knees   History of ventricular tachycardia  1. Auditory hallucinations  -possible etiologies include underlying dementia versus depression with psychotic features - waiting for behavioral health bed where can undergo further evaluation and medication management  -continue Risperdal to 0.5 mg q am and 1 mg qhs - appreciate social work's efforts in continuing to work on finding him a bed at a psychiatric facility   Dispo: Anticipated discharge to inpatient psych when bed available.   Modena Nunnery D, DO 08/13/2018, 10:24 AM Pager: [phone]

## 2018-08-14 NOTE — Progress Notes (Signed)
   Subjective: No acute events overnight. [name] was in good spirits today after [employer] won last night. He did not have to turn the tv up last night to drown out the voices and was able to sleep well. He reflects that as much as he wants to be at home, he would rather get help with the voices. No other concerns at this time.   Objective:  Vital signs in last 24 hours: Vitals:   08/13/18 2112 08/14/18 0652 08/14/18 0743 08/14/18 1028  BP: 123/63 125/65  (!) 115/50  Pulse: (!) 49 (!) 42  (!) 46  Resp: 18 16    Temp: 98 F (36.7 C) (!) 97.3 F (36.3 C)    TempSrc: Oral Oral    SpO2: 98%  96%   Weight:  87.5 kg    Height:       General: pleasant, elderly gentleman sitting in his chair in NAD Psych: appropriate mood and affect, engaging well. Has good insight and normal thought content  Assessment/Plan:  Principal Problem:   Psychosis ([place]) Active Problems:   Hypothyroidism   AMS (altered mental status)   B12 deficiency   Hallucinations   Primary osteoarthritis of both knees   History of ventricular tachycardia  1. Auditory hallucinations  -possible etiologies include underlying dementia versus depression with psychotic features - waiting for behavioral health bed where he can undergo more thorough neuropsychiatric evaluation and medication management  -continue Risperdal to 0.5 mg q am and 1 mg qhs - appreciate social work's efforts in continuing to work on finding him a bed at a psychiatric facility  Dispo: Anticipated discharge to inpatient psych when bed available.   Modena Nunnery D, DO 08/14/2018, 1:42 PM Pager: [phone]

## 2018-08-15 DIAGNOSIS — R05 Cough: Secondary | ICD-10-CM

## 2018-08-15 MED ORDER — FLUTICASONE PROPIONATE 50 MCG/ACT NA SUSP
2.0000 | Freq: Every day | NASAL | Status: DC
  Administered 2018-08-15 – 2018-08-23 (×9): 2 via NASAL
  Filled 2018-08-15: qty 16

## 2018-08-15 NOTE — Progress Notes (Signed)
   Subjective: No overnight events. Cody Faulkner reports that he did not hear the voice last night because he went to bed early and slept well. He states that he urinated in the bed last night. He says he only has urinary incontinence at night. He ate breakfast this morning. He endorses cough productive of phlegm in the morning that is not new for him.   Objective:  Vital signs in last 24 hours: Vitals:   08/14/18 1955 08/15/18 0300 08/15/18 0712 08/15/18 0843  BP: 130/61  123/65   Pulse: (!) 43  (!) 41   Resp: 20     Temp: 97.8 F (36.6 C)  97.8 F (36.6 C)   TempSrc: Oral  Oral   SpO2: 99%  97% 98%  Weight:  86.2 kg    Height:       Gen: sitting comfortably in chair, no distress CV: RRR, no murmurs Pulm: CTAB, normal effort on room air Psych: Normal affect, engages well  Assessment/Plan:  Principal Problem:   Psychosis ([facility]) Active Problems:   Hypothyroidism   AMS (altered mental status)   B12 deficiency   Hallucinations   Primary osteoarthritis of both knees   History of ventricular tachycardia  1.Auditory hallucinations -possible etiologies include underlying dementia versus depression with psychotic features -waiting for behavioral health bed where he can undergo more thorough neuropsychiatric evaluation and medication management -continueRisperdal to 0.5 mgq amand 1 mg qhs - appreciate social work's efforts in continuing to work on finding him a bed at a psychiatric facility  2. Cough - Likely postnasal drip  - Start flonase   3. Urinary incontinence - difficult to assess if this is true incontinence or if this just occurs in bed when he is unable to get a urinal in time  - will continue to monitor - will avoid anticholinergics given that it is centrally acting   Dispo: Anticipated discharge pending inpatient psych placement.   Modena Nunnery D, DO 08/15/2018, 11:00 AM Pager: [phone]

## 2018-08-16 DIAGNOSIS — Z8679 Personal history of other diseases of the circulatory system: Secondary | ICD-10-CM

## 2018-08-16 NOTE — Progress Notes (Signed)
On call MD paged about patients pulse being in the 40's. Advised this is a chronic pulse & no actions need to be taken at the moment. Patient is in bed resting with no distress. Will continue to monitor.

## 2018-08-16 NOTE — Plan of Care (Signed)
  Problem: Education: Goal: Knowledge of General Education information will improve Description Including pain rating scale, medication(s)/side effects and non-pharmacologic comfort measures Outcome: Progressing   Problem: Health Behavior/Discharge Planning: Goal: Ability to manage health-related needs will improve Outcome: Progressing   Problem: Clinical Measurements: Goal: Ability to maintain clinical measurements within normal limits will improve Outcome: Progressing Goal: Will remain free from infection Outcome: Progressing Goal: Diagnostic test results will improve Outcome: Progressing Goal: Respiratory complications will improve Outcome: Progressing Goal: Cardiovascular complication will be avoided Outcome: Progressing   Problem: Activity: Goal: Risk for activity intolerance will decrease Outcome: Progressing   Problem: Nutrition: Goal: Adequate nutrition will be maintained Outcome: Progressing   Problem: Coping: Goal: Level of anxiety will decrease Outcome: Progressing   Problem: Elimination: Goal: Will not experience complications related to bowel motility Outcome: Progressing Goal: Will not experience complications related to urinary retention Outcome: Progressing   Problem: Pain Managment: Goal: General experience of comfort will improve Outcome: Progressing   Problem: Safety: Goal: Ability to remain free from injury will improve Outcome: Progressing   Problem: Skin Integrity: Goal: Risk for impaired skin integrity will decrease Outcome: Progressing   Problem: Education: Goal: Ability to demonstrate management of disease process will improve Outcome: Progressing Goal: Ability to verbalize understanding of medication therapies will improve Outcome: Progressing Goal: Individualized Educational Video(s) Outcome: Progressing   Problem: Activity: Goal: Capacity to carry out activities will improve Outcome: Progressing   Problem: Cardiac: Goal:  Ability to achieve and maintain adequate cardiopulmonary perfusion will improve Outcome: Progressing    Patient is not having any hallucinations today

## 2018-08-16 NOTE — Progress Notes (Signed)
Report given.  Notified the family of patient new location.

## 2018-08-16 NOTE — Progress Notes (Signed)
Physical Therapy Treatment Patient Details Name: HANLEY WOERNER MRN: [id number] DOB: 05/30/1946 Today's Date: 08/16/2018    History of Present Illness Pt is a 73 y.o. male admitted 07/26/18 with hallucinations. Worked up for depression with psychotic features vs neuropsychiatric manifestations of underlying dementia. PMH includes CAD s/p ICD/defibrillator, afib, CKD3, gout.     PT Comments    Patient seen for mobility progression. Pt pleasant an agreeable to participate. Pt overall requires supervision/min guard with use of AD, but grossly supervision. Pt reports no pain with mobility this session. Continue to progress as tolerated.     Follow Up Recommendations  Other (comment)([facility])     Equipment Recommendations  None recommended by PT    Recommendations for Other Services       Precautions / Restrictions Precautions Precautions: Fall Precaution Comments: vision limited on L Restrictions Weight Bearing Restrictions: No    Mobility  Bed Mobility               General bed mobility comments: pt OOB in chair upon arrival  Transfers Overall transfer level: Needs assistance Equipment used: Rolling walker (2 wheeled) Transfers: Sit to/from Stand Sit to Stand: Supervision         General transfer comment: supervision for safety  Ambulation/Gait Ambulation/Gait assistance: Supervision;Min guard Gait Distance (Feet): 300 Feet Assistive device: Rolling walker (2 wheeled) Gait Pattern/deviations: Step-through pattern;Decreased stride length;Decreased dorsiflexion - right;Decreased dorsiflexion - left;Wide base of support;Trunk flexed Gait velocity: Decreased   General Gait Details: cues for posture and maintaining closer proximity to [employer] Mobility    Modified Rankin (Stroke Patients Only)       Balance Overall balance assessment: Needs assistance   Sitting balance-[name]: Good     Standing balance support:  Bilateral upper extremity supported Standing balance-[name]: Poor Standing balance comment: Reliant on UE support                            Cognition Arousal/Alertness: Awake/alert Behavior During Therapy: WFL for tasks assessed/performed Overall Cognitive Status: Within Functional Limits for tasks assessed                                        Exercises      General Comments        Pertinent Vitals/Pain Pain Assessment: No/denies pain    Home Living                      Prior Function            PT Goals (current goals can now be found in the care plan section) Progress towards PT goals: Progressing toward goals    Frequency    Min 2X/week      PT Plan Current plan remains appropriate    Co-evaluation              AM-PAC PT "6 Clicks" Mobility   Outcome Measure  Help needed turning from your back to your side while in a flat bed without using bedrails?: None Help needed moving from lying on your back to sitting on the side of a flat bed without using bedrails?: None Help needed moving to and from a bed to a chair (including a wheelchair)?: A Little  Help needed standing up from a chair using your arms (e.g., wheelchair or bedside chair)?: A Little Help needed to walk in hospital room?: A Little Help needed climbing 3-5 steps with a railing? : A Little 6 Click Score: 20    End of Session Equipment Utilized During Treatment: Gait belt Activity Tolerance: Patient tolerated treatment well Patient left: in chair;with call bell/phone within reach;with chair alarm set Nurse Communication: Mobility status PT Visit Diagnosis: Unsteadiness on feet (R26.81);Muscle weakness (generalized) (M62.81);Difficulty in walking, not elsewhere classified (R26.2);Pain;History of falling (Z91.81) Pain - Right/Left: Left Pain - part of body: Knee     Time: 1020-1045 PT Time Calculation (min) (ACUTE ONLY): 25 min  Charges:   $Gait Training: 23-37 mins                     Earney Navy, PTA Acute Rehabilitation Services Pager: [phone] Office: [phone]     Darliss Cheney 08/16/2018, 11:02 AM

## 2018-08-16 NOTE — Progress Notes (Addendum)
Asked to comment on asymptomatic bradycardia - apparently the patient has not been considered a candidate for placement due to HR in the 40's. I personally reviewed the chart and spoke with Tommye Standard, NP with cardiac electrophysiology. It appears although he has a dual chamber AICD, the atrial lead is non-functional and he has been programmed VVI. The lower rate is set at 40. Since his sinus rate is in the mid-40's on low dose amiodarone, the pacer was programmed lower to allow intrinsic AV conduction. 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 The only other option to increase rate would be to decrease or stop amiodarone, however, this may expose him to recurrent VT. Since he is asymptomatic, there is no indication to do either one of these things. From a cardiac standpoint, he would be safe to rehabilitate with HR in the 40's.  Pixie Casino, MD, [facility], [place], [place] Director of the [facility] Lipid Disorders &  Cardiovascular Risk Reduction Clinic Diplomate of the American Board of Clinical Lipidology Attending Cardiologist  Direct Dial: [phone]  Fax: [phone]  Website:  [email or web address]

## 2018-08-16 NOTE — Progress Notes (Signed)
CSW spoke with Mendel Ryder at [facility] who requested faxed copy of patient's IVC. CSW faxed IVC. Mendel Ryder reports she will call CSW when bed avail.   CSW will continue to follow up.   [place], [place]

## 2018-08-16 NOTE — Progress Notes (Signed)
   Subjective: No acute events overnight. Seen briefly this morning, as he was requesting a nurse tech to assist him to the bathroom. He did not have any acute concerns today.    Objective:  Vital signs in last 24 hours: Vitals:   08/15/18 2145 08/15/18 2217 08/16/18 0308 08/16/18 0449  BP:  (!) 142/69 135/62   Pulse:  (!) 42 (!) 41 (!) 43  Resp:  18 18   Temp:  98.1 F (36.7 C) 97.9 F (36.6 C)   TempSrc:  Oral Oral   SpO2: 96% 99% 99%   Weight:      Height:       General: awake, alert, sitting up in his chair in NAD Psych: normal mood and affect; engages appropriately Ext: chronic venous stasis changes on BLE  Assessment/Plan:  Principal Problem:   Psychosis (HCC) Active Problems:   Hypothyroidism   AMS (altered mental status)   B12 deficiency   Hallucinations   Primary osteoarthritis of both knees   History of ventricular tachycardia  1.Auditory hallucinations -possible etiologies include underlying dementia versus depression with psychotic features -waiting for behavioral health bed wherehe can undergo more thorough neuropsychiatricevaluation and medication management -continueRisperdal to 0.5 mgq amand 1 mg qhs; this dosing seems to be helping him sleep better at night - appreciate social work's efforts in continuing to work on finding him a bed at a psychiatric facility  2. Cough - Likely postnasal drip  - Continue flonase   3. Urinary incontinence - difficult to assess if this is true incontinence or if this just occurs in bed when he is unable to get a urinal in time  - will continue to monitor - will avoid anticholinergics given that they are centrally acting   Dispo: Anticipated discharge to inpatient psych when bed available.   Delice Bison, DO 08/16/2018, 6:36 AM Pager: [phone]

## 2018-08-16 NOTE — Plan of Care (Signed)

## 2018-08-16 NOTE — Progress Notes (Signed)
[facility] reviewing referral to see if they need additional info before placing patient on their waitlist.   Cedric Fishman LCSW [phone]

## 2018-08-17 NOTE — Progress Notes (Signed)
Medicine attending: I examined this patient today together with resident physician Dr. Vilma Prader and I concur with her evaluation and management plan. We greatly appreciate cardiology input.  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  The ventricular pacing rate was set deliberately low at 40 bpm to avoid long-term chance of inducing a cardiomyopathy. Patient has had no hallucinations now for about 48 hours.  Currently on Risperdal 0.5 mg in the a.m. and 1 mg at night. Medically stable pending availability of a behavioral health bed.

## 2018-08-17 NOTE — Plan of Care (Signed)
Problem: Education: Goal: Knowledge of General Education information will improve Description Including pain rating scale, medication(s)/side effects and non-pharmacologic comfort measures Outcome: Progressing   Problem: Health Behavior/Discharge Planning: Goal: Ability to manage health-related needs will improve Outcome: Progressing   Problem: Clinical Measurements: Goal: Respiratory complications will improve Outcome: Progressing Goal: Cardiovascular complication will be avoided Outcome: Progressing   Problem: Activity: Goal: Risk for activity intolerance will decrease Outcome: Progressing   Problem: Nutrition: Goal: Adequate nutrition will be maintained Outcome: Progressing   Problem: Coping: Goal: Level of anxiety will decrease Outcome: Progressing   Problem: Elimination: Goal: Will not experience complications related to urinary retention Outcome: Progressing   Problem: Pain Managment: Goal: General experience of comfort will improve Outcome: Progressing   Problem: Safety: Goal: Ability to remain free from injury will improve Outcome: Progressing   

## 2018-08-17 NOTE — Plan of Care (Signed)
  Problem: Coping: Goal: Level of anxiety will decrease Outcome: Progressing   Problem: Activity: Goal: Risk for activity intolerance will decrease Outcome: Progressing   Problem: Safety: Goal: Ability to remain free from injury will improve Outcome: Progressing

## 2018-08-17 NOTE — Plan of Care (Signed)

## 2018-08-17 NOTE — Progress Notes (Signed)
   Subjective: No overnight events. Patient reports he had diarrhea during the day yesterday. This has resolved now. He denies abdominal pain. He accidentally urinated in the bed again last night. He denies hearing voices today. His only question is when he will be leaving the hospital.  Objective:  Vital signs in last 24 hours: Vitals:   08/16/18 0756 08/16/18 1148 08/16/18 2026 08/17/18 0537  BP: 122/66 (!) 108/57  (!) 110/59  Pulse: (!) 44 (!) 47  91  Resp: 18 18  16   Temp: (!) 97.5 F (36.4 C)   98.5 F (36.9 C)  TempSrc: Oral   Oral  SpO2: 97% 99% 97% 98%  Weight:      Height:       Gen: sitting comfortably in a chair, no distress CV: RRR, no murmurs Pulm: CTAB, normal effort on room air Ext: no edema Psych: appropriate affect, engages well  Assessment/Plan:  Principal Problem:   Psychosis (HCC) Active Problems:   Hypothyroidism   AMS (altered mental status)   B12 deficiency   Hallucinations   Primary osteoarthritis of both knees   History of ventricular tachycardia  1.Auditory hallucinations -possible etiologies include underlying dementia versus depression with psychotic features -waiting for behavioral health bed wherehe can undergo more thorough neuropsychiatricevaluation and medication management -continueRisperdal to 0.5 mgq amand 1 mg qhs; this dosing seems to be helping him sleep better at night and keep the voices at [place] - appreciate social work's efforts in continuing to work on finding him a bed at a psychiatric facility  2. Asymptomatic bradycardia - greatly appreciate cardiology evaluation yesterday - patient's defibrillator does have pacemaker function with rate set in 40s to allow intrinsic AV conduction - since he asymptomatic, no indication for intervention   Dispo: Medically stable for discharge to inpatient psychiatric facility when bed is available.   Modena Nunnery D, DO 08/17/2018, 6:23 AM Pager: [phone]

## 2018-08-18 MED ORDER — POLYETHYLENE GLYCOL 3350 17 G PO PACK
17.0000 g | PACK | Freq: Every day | ORAL | Status: DC
  Administered 2018-08-18 – 2018-08-23 (×6): 17 g via ORAL
  Filled 2018-08-18 (×6): qty 1

## 2018-08-18 NOTE — Plan of Care (Signed)
  Problem: Coping: Goal: Level of anxiety will decrease Outcome: Progressing   Problem: Pain Managment: Goal: General experience of comfort will improve Outcome: Progressing   Problem: Safety: Goal: Ability to remain free from injury will improve Outcome: Progressing   Problem: Skin Integrity: Goal: Risk for impaired skin integrity will decrease Outcome: Progressing   

## 2018-08-18 NOTE — Progress Notes (Signed)
   Subjective: [name] was seen while getting shaved this morning. No acute events overnight. He is feeling well. Requesting an update on when he will be able to get out of here. I assured him we are doing everything we can to find him a place to go. All other questions and concerns were addressed.   Objective:  Vital signs in last 24 hours: Vitals:   08/17/18 1951 08/17/18 2049 08/17/18 2120 08/18/18 0453  BP:  (!) 141/75  127/68  Pulse:  (!) 42 62 (!) 45  Resp:  16  16  Temp:  97.9 F (36.6 C)  98.2 F (36.8 C)  TempSrc:  Oral  Oral  SpO2: 98% 98%  97%  Weight:      Height:       General: awake, alert, sitting up in his chair in NAD Psych: pleasant mood and affect; engages appropriately   Assessment/Plan:  Principal Problem:   Psychosis ([place]) Active Problems:   Hypothyroidism   AMS (altered mental status)   B12 deficiency   Hallucinations   Primary osteoarthritis of both knees   History of ventricular tachycardia  1.Auditory hallucinations -possible etiologies include underlying dementia versus depression with psychotic features -waiting for behavioral health bed wherehe can undergo more thorough neuropsychiatricevaluation and medication management -continueRisperdal to 0.5 mgq amand 1 mg qhs; this dosing seems to be helping him sleep better at night and hear the voices less often - appreciate social work's efforts in continuing to work on finding him a bed at a psychiatric facility  2. Asymptomatic bradycardia - patient's defibrillator does have pacemaker function with rate set in 40s to allow intrinsic AV conduction - evaluated by cardiology and did not recommend any intervention since he is asymptomatic  Dispo: Medically stable for discharge to inpatient psychiatric facility when bed is available.   Modena Nunnery D, DO 08/18/2018, 6:54 AM Pager: [phone]

## 2018-08-18 NOTE — Progress Notes (Signed)
CSW contacted [facility] to follow up on waitlist status. They administered screening questions related to the Coronavirus and will contact CSW with further needs.   Cody Faulkner Cody Faulkner Cody Matsushita LCSW [phone]

## 2018-08-18 NOTE — Plan of Care (Signed)
Problem: Education: Goal: Knowledge of General Education information will improve Description Including pain rating scale, medication(s)/side effects and non-pharmacologic comfort measures Outcome: Progressing   Problem: Health Behavior/Discharge Planning: Goal: Ability to manage health-related needs will improve Outcome: Progressing   Problem: Clinical Measurements: Goal: Ability to maintain clinical measurements within normal limits will improve Outcome: Progressing Goal: Will remain free from infection Outcome: Progressing   Problem: Activity: Goal: Risk for activity intolerance will decrease Outcome: Progressing   Problem: Coping: Goal: Level of anxiety will decrease Outcome: Progressing   Problem: Elimination: Goal: Will not experience complications related to urinary retention Outcome: Progressing   Problem: Safety: Goal: Ability to remain free from injury will improve Outcome: Progressing   Problem: Skin Integrity: Goal: Risk for impaired skin integrity will decrease Outcome: Progressing

## 2018-08-19 NOTE — Progress Notes (Signed)
Physical Therapy Treatment Patient Details Name: Cody Faulkner MRN: [id number] DOB: March 26, 1946 Today's Date: 08/19/2018    History of Present Illness Pt is a 73 y.o. male admitted 07/26/18 with hallucinations. Worked up for depression with psychotic features vs neuropsychiatric manifestations of underlying dementia. PMH includes CAD s/p ICD/defibrillator, afib, CKD3, gout.     PT Comments    Patient continues to progress toward PT goals and overall mod I/Supervision this session. Pt is eager to mobilize and able to ambulate 500 ft with RW. Current plan remains appropriate.    Follow Up Recommendations  Other (comment)([facility])     Equipment Recommendations  None recommended by PT    Recommendations for Other Services       Precautions / Restrictions Precautions Precautions: Fall Precaution Comments: vision limited on L Restrictions Weight Bearing Restrictions: No Other Position/Activity Restrictions: chronic painful left knee    Mobility  Bed Mobility               General bed mobility comments: pt up with NT upon arrival  Transfers Overall transfer level: Needs assistance Equipment used: Rolling walker (2 wheeled) Transfers: Sit to/from Stand Sit to Stand: Supervision            Ambulation/Gait Ambulation/Gait assistance: Supervision;Modified independent (Device/Increase time) Gait Distance (Feet): 500 Feet Assistive device: Rolling walker (2 wheeled) Gait Pattern/deviations: Step-through pattern;Decreased stride length;Wide base of support;Trunk flexed Gait velocity: Decreased   General Gait Details: cues for posture; no LOB   Stairs             Wheelchair Mobility    Modified Rankin (Stroke Patients Only)       Balance Overall balance assessment: Needs assistance Sitting-balance support: Feet supported;No upper extremity supported Sitting balance-[name]: Good     Standing balance support: Bilateral upper extremity  supported Standing balance-[name]: Poor Standing balance comment: Reliant on UE support                            Cognition Arousal/Alertness: Awake/alert Behavior During Therapy: WFL for tasks assessed/performed Overall Cognitive Status: Within Functional Limits for tasks assessed                                        Exercises      General Comments        Pertinent Vitals/Pain Pain Assessment: Faces Faces Pain Scale: Hurts a little bit Pain Location: L knee Pain Descriptors / Indicators: Aching;Discomfort Pain Intervention(s): Monitored during session    Home Living                      Prior Function            PT Goals (current goals can now be found in the care plan section) Progress towards PT goals: Progressing toward goals    Frequency    Min 2X/week      PT Plan Current plan remains appropriate    Co-evaluation              AM-PAC PT "6 Clicks" Mobility   Outcome Measure  Help needed turning from your back to your side while in a flat bed without using bedrails?: None Help needed moving from lying on your back to sitting on the side of a flat bed without using bedrails?: None Help needed moving to  and from a bed to a chair (including a wheelchair)?: None Help needed standing up from a chair using your arms (e.g., wheelchair or bedside chair)?: None Help needed to walk in hospital room?: None Help needed climbing 3-5 steps with a railing? : A Little 6 Click Score: 23    End of Session Equipment Utilized During Treatment: Gait belt Activity Tolerance: Patient tolerated treatment well Patient left: in chair;with call bell/phone within reach;with chair alarm set Nurse Communication: Mobility status PT Visit Diagnosis: Unsteadiness on feet (R26.81);Muscle weakness (generalized) (M62.81);Difficulty in walking, not elsewhere classified (R26.2);Pain;History of falling (Z91.81) Pain - Right/Left:  Left Pain - part of body: Knee     Time: 1001-1016 PT Time Calculation (min) (ACUTE ONLY): 15 min  Charges:  $Gait Training: 8-22 mins                     Earney Navy, PTA Acute Rehabilitation Services Pager: [phone] Office: [phone]     Darliss Cheney 08/19/2018, 11:06 AM

## 2018-08-19 NOTE — Progress Notes (Signed)
CSW renewed IVC and faxed to magistrate.   [place], [place]

## 2018-08-19 NOTE — Progress Notes (Signed)
Medicine attending: I examined this patient today together with resident physician Modena Nunnery and I concur with her evaluation and management plan. Physical exam stable and at his baseline. He does appear to be responding to dose adjustment in Risperdal.  He has not heard voices now for about 3 days. We may be able to send him home with outpatient psychiatric follow-up. We need to contact family.  He lives alone in a trailer.  He is unsteady on his feet due to his arthritis and deconditioning.  We will interface with our care management team to see if we can provide any home services for him.

## 2018-08-19 NOTE — Progress Notes (Signed)
   Subjective: No overnight events. This morning the patient is upset because he had a bowel movement in bed and had gas after taking a laxative yesterday for constipation. He also endorses a productive cough in the morning. He has not heard from "the man" in recent days.   Objective:  Vital signs in last 24 hours: Vitals:   08/18/18 1702 08/18/18 2021 08/18/18 2044 08/19/18 0527  BP: 139/72 140/67  128/63  Pulse: (!) 42 (!) 53  75  Resp: 20 18  18   Temp: 98 F (36.7 C) 97.9 F (36.6 C)  97.6 F (36.4 C)  TempSrc: Oral Oral  Oral  SpO2: 100% 97% 97% 96%  Weight:      Height:       Gen: patient seen ambulating from his bed to the chair with two person assist and walker, no distress CV: RRR, no murmurs Pulm: CTAB, normal effort on room air Psych: appropriate affect, engages well   Assessment/Plan:  Principal Problem:   Psychosis (HCC) Active Problems:   Hypothyroidism   AMS (altered mental status)   B12 deficiency   Hallucinations   Primary osteoarthritis of both knees   History of ventricular tachycardia  1.Auditory hallucinations -possible etiologies include underlying dementia versus depression with psychotic features -waiting for behavioral health bed wherehe can undergo more thorough neuropsychiatricevaluation and medicationmanagement -continueRisperdal to 0.5 mgq amand 1 mg qhs; this dosing seems to be helping him sleep better at nightand hear the voices less often - appreciate social work's efforts in continuing to work on finding him a bed at a psychiatric facility. If we do not hear something soon, and the auditory hallucinations remains subsided, may look into transitioning him home with home health services   2. Asymptomatic bradycardia - patient's defibrillator does have pacemaker function with rate set in 40s to allow intrinsic AV conduction - evaluated by cardiology and did not recommend any intervention since he is asymptomatic  Dispo:  Medically stable for discharge to inpatient psychiatric facility when bed is available.   Modena Nunnery D, DO 08/19/2018, 6:45 AM Pager: [phone]

## 2018-08-19 NOTE — Plan of Care (Signed)

## 2018-08-20 DIAGNOSIS — R32 Unspecified urinary incontinence: Secondary | ICD-10-CM

## 2018-08-20 LAB — URINALYSIS, ROUTINE W REFLEX MICROSCOPIC
Bilirubin Urine: NEGATIVE
Glucose, UA: NEGATIVE mg/dL
Hgb urine dipstick: NEGATIVE
Ketones, ur: NEGATIVE mg/dL
Leukocytes,Ua: NEGATIVE
Nitrite: NEGATIVE
Protein, ur: NEGATIVE mg/dL
Specific Gravity, Urine: 1.01 (ref 1.005–1.030)
pH: 6 (ref 5.0–8.0)

## 2018-08-20 MED ORDER — TAMSULOSIN HCL 0.4 MG PO CAPS
0.4000 mg | ORAL_CAPSULE | Freq: Every day | ORAL | Status: DC
  Administered 2018-08-20 – 2018-08-23 (×4): 0.4 mg via ORAL
  Filled 2018-08-20 (×4): qty 1

## 2018-08-20 NOTE — Progress Notes (Signed)
Physical Therapy Treatment Patient Details Name: Cody Faulkner MRN: [id number] DOB: 06/25/1945 Today's Date: 08/20/2018    History of Present Illness Pt is a 73 y.o. male admitted 07/26/18 with hallucinations. Worked up for depression with psychotic features vs neuropsychiatric manifestations of underlying dementia. PMH includes CAD s/p ICD/defibrillator, afib, CKD3, gout.     PT Comments    Pt asleep on arrival to room and seemed mildly confused on waking, however cognition improved as session continued. He ambulated the entire unit with supervision for safety. Will continue to follow to progress mobility and safety with ambulation.     Follow Up Recommendations  Other (comment)([facility])     Equipment Recommendations  None recommended by PT    Recommendations for Other Services       Precautions / Restrictions Precautions Precautions: Fall Precaution Comments: vision limited on L Restrictions Weight Bearing Restrictions: No Other Position/Activity Restrictions: chronic painful left knee    Mobility  Bed Mobility               General bed mobility comments: in recliner chair on arrival  Transfers Overall transfer level: Needs assistance Equipment used: Rolling walker (2 wheeled) Transfers: Sit to/from Stand Sit to Stand: Supervision         General transfer comment: supervision for safety  Ambulation/Gait Ambulation/Gait assistance: Supervision;Modified independent (Device/Increase time) Gait Distance (Feet): 500 Feet Assistive device: Rolling walker (2 wheeled) Gait Pattern/deviations: Step-through pattern;Decreased stride length;Trunk flexed;Narrow base of support     General Gait Details: Cues for posture and forward gaze. Pt beginning to shuffle as he fatigued. Overal steady with RW during ambulation   Stairs             Wheelchair Mobility    Modified Rankin (Stroke Patients Only)       Balance Overall balance assessment: Needs  assistance Sitting-balance support: Feet supported;No upper extremity supported Sitting balance-[name]: Good     Standing balance support: Bilateral upper extremity supported Standing balance-[name]: Poor Standing balance comment: Reliant on UE support                            Cognition Arousal/Alertness: Awake/alert Behavior During Therapy: WFL for tasks assessed/performed Overall Cognitive Status: Within Functional Limits for tasks assessed                                 General Comments: Some potential delay in processing and poor insight into deficits, likely baseline cognition      Exercises      General Comments General comments (skin integrity, edema, etc.): Pt wants to walk daily. Spoke with RN about ambulating with him tomorrow.      Pertinent Vitals/Pain Pain Assessment: Faces Faces Pain Scale: Hurts little more Pain Location: L knee Pain Descriptors / Indicators: Aching;Discomfort Pain Intervention(s): Monitored during session;Limited activity within patient's tolerance    Home Living                      Prior Function            PT Goals (current goals can now be found in the care plan section) Acute Rehab PT Goals Patient Stated Goal: "I hope I'm walking better" PT Goal Formulation: With patient Time For Goal Achievement: 08/19/18 Potential to Achieve Goals: Good Progress towards PT goals: Progressing toward goals    Frequency  Min 2X/week      PT Plan Current plan remains appropriate    Co-evaluation              AM-PAC PT "6 Clicks" Mobility   Outcome Measure  Help needed turning from your back to your side while in a flat bed without using bedrails?: None Help needed moving from lying on your back to sitting on the side of a flat bed without using bedrails?: None Help needed moving to and from a bed to a chair (including a wheelchair)?: None Help needed standing up from a chair  using your arms (e.g., wheelchair or bedside chair)?: None Help needed to walk in hospital room?: None Help needed climbing 3-5 steps with a railing? : A Little 6 Click Score: 23    End of Session Equipment Utilized During Treatment: Gait belt Activity Tolerance: Patient tolerated treatment well Patient left: in chair;with call bell/phone within reach;with chair alarm set Nurse Communication: Mobility status PT Visit Diagnosis: Unsteadiness on feet (R26.81);Muscle weakness (generalized) (M62.81);Difficulty in walking, not elsewhere classified (R26.2);Pain;History of falling (Z91.81) Pain - Right/Left: Left Pain - part of body: Knee     Time: 4585-9292 PT Time Calculation (min) (ACUTE ONLY): 17 min  Charges:  $Gait Training: 8-22 mins                    Benjiman Core, [place] Pager [phone] Acute Rehab   Allena Katz 08/20/2018, 2:11 PM

## 2018-08-20 NOTE — Progress Notes (Signed)
   Subjective: No overnight events. This morning, [name] is concerned about ongoing urinary incontinence that happens at night. Sometimes it hurts when he urinates. He feels like he's urinating frequently, but urinating small amounts when he does go. All of his questions were addressed.   Objective:  Vital signs in last 24 hours: Vitals:   08/19/18 2018 08/19/18 2051 08/20/18 0420 08/20/18 0500  BP: (!) 162/69  137/65   Pulse: (!) 41 (!) 50 (!) 42   Resp:  16    Temp: 97.9 F (36.6 C)  97.6 F (36.4 C)   TempSrc: Oral  Oral   SpO2: 98% 98% 97%   Weight:    91 kg  Height:       Gen: lying comfortably in bed, no distress CV: RRR, no murmurs Pulm: CTAB, normal effort on room air Ext: No edema  Assessment/Plan:  Principal Problem:   Psychosis (HCC) Active Problems:   Hypothyroidism   AMS (altered mental status)   B12 deficiency   Hallucinations   Primary osteoarthritis of both knees   History of ventricular tachycardia  Auditory hallucinations: Patient denies recent hallucinations and appears to be improving on his Risperdal.  Given the difficulty in obtaining a bed and the notable improvement while on Risperdal the patient will likely be stable for discharge home with family support in the next few days.  We are awaiting return of family for more in-depth discussion with goals of care. Continue Risperdal 05 mg every morning and 1 mg nightly  Urinary incontinence/retention: Patient describes episodes of frequent urinary incontinence, pain with urination, difficulty voiding, weak stream, and frequent urination with incomplete voiding possibly consistent with prostate hypertrophy.  We will initiate tamsulosin 0.4 mg daily and the patient follow-up with outpatient provider.  Additional mild bone tenderness on palpation of the lower abdomen. Aside from risperidone as a possible source of his urinary incontinence his medication list does not contain the more common medications  associated with urinary retention.  Flomax 0.4mg  daily UA ordered but unremarkable Bladder scan with 467mL's retained volume, ordered one time I/O cath May need a foley catheter placed with urology consultation if the I/O cath fails  Dispo: Anticipated discharge in approximately 2 day(s).   Cody Faulkner, Andree Elk, MD 08/20/2018, 8:06 AM Pager: [phone]

## 2018-08-21 NOTE — Progress Notes (Signed)
   Subjective: No acute events overnight. Feeling well this morning. Thinks the urinary medication is helping, as he did not have issues with wetting the bed last night. Has not heard "the man" for several nights now. Discussed that we will likely be able to get him home in the next 1-2 days with outpatient follow-up. All other questions and concerns were addressed.   Objective:  Vital signs in last 24 hours: Vitals:   08/20/18 1534 08/20/18 2010 08/20/18 2055 08/21/18 0501  BP: (!) 122/58 109/62  (!) 165/65  Pulse: (!) 43 (!) 42 (!) 52 (!) 42  Resp:   16   Temp: 98.2 F (36.8 C) 97.7 F (36.5 C)  98 F (36.7 C)  TempSrc: Oral Oral  Oral  SpO2: 98% 99% 99% 97%  Weight:      Height:       General: awake, alert, sitting up in his chair in NAD Psych: engages appropriately; normal mood and affect; no auditory or visual hallucinations Ext: Chronic venous stasis changes in BLE  Assessment/Plan:  Principal Problem:   Psychosis (HCC) Active Problems:   Hypothyroidism   AMS (altered mental status)   B12 deficiency   Hallucinations   Primary osteoarthritis of both knees   History of ventricular tachycardia   Urinary incontinence  Auditory hallucinations: Patient denies recent hallucinations and appears to be improving on his Risperdal.  Given the difficulty in obtaining a bed and the notable improvement while on Risperdal the patient will likely be stable for discharge home with family support in the next 1-2 days. We are awaiting return of family for more in-depth discussion with goals of care.  - Continue Risperdal 05 mg every morning and 1 mg nightly  Urinary incontinence/retention: - patient endorses symptom improvement since initiating Flomax - likely BPH related  - will continue to monitor urinary output and have him follow-up with PCP at discharge for titration of medication as needed   Dispo: Anticipated discharge in approximately 1-2 day(s).   Modena Nunnery D, DO  08/21/2018, 7:59 AM Pager: [phone]

## 2018-08-21 NOTE — Progress Notes (Signed)
Pts family member, [name], has concerns about pt being discharged home, would like him to go back to facility because pt will not have 24/7 care at home. Message given to MD.

## 2018-08-22 DIAGNOSIS — R339 Retention of urine, unspecified: Secondary | ICD-10-CM

## 2018-08-22 NOTE — Plan of Care (Signed)
  Problem: Education: Goal: Knowledge of General Education information will improve Description: Including pain rating scale, medication(s)/side effects and non-pharmacologic comfort measures Outcome: Progressing   Problem: Health Behavior/Discharge Planning: Goal: Ability to manage health-related needs will improve Outcome: Progressing   Problem: Activity: Goal: Risk for activity intolerance will decrease Outcome: Progressing   

## 2018-08-22 NOTE — Progress Notes (Signed)
   Subjective: No overnight events. Although, Cody Faulkner reports that he did urinate in the bed again last night. This morning, he was able to urinate and have a bowel movement while in the bathroom. He feels better after the bowel movement. He states that he lives at home by himself. His cousin Cody Faulkner visits everyday to help him and take care of the animals. The patient states he wants to go home, but understands that his family has reservations.   Objective:  Vital signs in last 24 hours: Vitals:   08/21/18 2038 08/21/18 2107 08/22/18 0445 08/22/18 0849  BP: (!) 138/55  119/68 105/62  Pulse: (!) 41 (!) 56 (!) 40 (!) 51  Resp:  14    Temp: (!) 97.4 F (36.3 C)  98.4 F (36.9 C)   TempSrc: Oral  Oral   SpO2: 99% 99% 97%   Weight:      Height:       Gen: sitting in a chair, no distress CV: RRR, no murmurs Pulm: CTAB, normal effort on room air Psych: appropriate affect, engages well  Assessment/Plan:  Principal Problem:   Psychosis (HCC) Active Problems:   Hypothyroidism   AMS (altered mental status)   B12 deficiency   Hallucinations   Primary osteoarthritis of both knees   History of ventricular tachycardia   Urinary incontinence  # Auditory hallucinations: Patient denies recent VH or AH since starting Risperdal. He denied any acute complaints today.  We are concerned he may not be able to take care of himself at home as this is his 3rd admission for similar symptoms within the past 3 months.  Unfortunately, he does not qualify for SNF, but I reached out to social work to see if he would qualify for assisted living facility as he lives alone and does not have support at home.  - Continue Risperdal 0.5mg  AM and 1 mg PM - Appreciate social worker help  # Urinary incontinence/retention: Thought to be secondary to BPH.  Symptoms improving since starting Flomax, but did have an episode of incontinence overnight. Will continue to monitor urinary output and have him follow-up with PCP  at discharge for titration of medication as needed   Dispo: Anticipated discharge in approximately today-2 day(s) pending ALF placement if patient qualifies.   Dorrell, Andree Elk, MD 08/22/2018, 11:22 AM Pager: [phone]

## 2018-08-22 NOTE — Progress Notes (Signed)
Internal Medicine Attending:   I saw and examined the patient. I reviewed the resident's note and I agree with the resident's findings and plan as documented in the resident's note.  Patient feels well today with no new complaints.  He did state that he urinated in the bed last night but this morning was able to urinate and have a bowel movement while in the bathroom.  Patient was initially admitted to the hospital with auditory hallucinations.  He was started on Risperdal here which resolved his symptoms.  There is a concern the patient may not be able to take care of himself at home given that this is his third admission in the last 3 months with similar symptoms.  He however does not qualify for SNF placement at this time.  Resident recheck to social work to see if we could get him placed in an assisted living facility at the lives at home alone and does not have any family support for most of the time that he is there.  We will follow-up social work recommendations.  No further work-up at this time.

## 2018-08-23 DIAGNOSIS — Z79899 Other long term (current) drug therapy: Secondary | ICD-10-CM

## 2018-08-23 DIAGNOSIS — Z888 Allergy status to other drugs, medicaments and biological substances status: Secondary | ICD-10-CM

## 2018-08-23 DIAGNOSIS — N39498 Other specified urinary incontinence: Secondary | ICD-10-CM

## 2018-08-23 DIAGNOSIS — I1310 Hypertensive heart and chronic kidney disease without heart failure, with stage 1 through stage 4 chronic kidney disease, or unspecified chronic kidney disease: Secondary | ICD-10-CM

## 2018-08-23 DIAGNOSIS — I5030 Unspecified diastolic (congestive) heart failure: Secondary | ICD-10-CM

## 2018-08-23 DIAGNOSIS — R338 Other retention of urine: Secondary | ICD-10-CM

## 2018-08-23 DIAGNOSIS — N401 Enlarged prostate with lower urinary tract symptoms: Secondary | ICD-10-CM

## 2018-08-23 MED ORDER — RISPERIDONE 0.5 MG PO TABS: ORAL_TABLET | ORAL | 1 refills | Status: DC

## 2018-08-23 MED ORDER — FLUTICASONE PROPIONATE 50 MCG/ACT NA SUSP: 2.0000 | Freq: Every day | NASAL | 2 refills | Status: AC

## 2018-08-23 MED ORDER — TAMSULOSIN HCL 0.4 MG PO CAPS: 0.4000 mg | ORAL_CAPSULE | Freq: Every day | ORAL | 1 refills | Status: AC

## 2018-08-23 MED ORDER — CYANOCOBALAMIN 1000 MCG PO TABS: 1000.0000 ug | ORAL_TABLET | Freq: Every day | ORAL | 2 refills | Status: AC

## 2018-08-23 MED ORDER — PANTOPRAZOLE SODIUM 40 MG PO TBEC: 40.0000 mg | DELAYED_RELEASE_TABLET | Freq: Every day | ORAL | 1 refills | Status: DC

## 2018-08-23 MED ORDER — ALBUTEROL SULFATE HFA 108 (90 BASE) MCG/ACT IN AERS: 2.0000 | INHALATION_SPRAY | Freq: Four times a day (QID) | RESPIRATORY_TRACT | 2 refills | Status: AC | PRN

## 2018-08-23 MED ORDER — POLYETHYLENE GLYCOL 3350 17 G PO PACK: 17.0000 g | PACK | Freq: Every day | ORAL | 0 refills | Status: DC

## 2018-08-23 NOTE — TOC Transition Note (Signed)
Transition of Care [facility]) - CM/SW Discharge Note   Patient Details  Name: Cody Faulkner MRN: [id number] Date of Birth: 09/08/1945  Transition of Care [facility]) CM/SW Contact:  Ninfa Meeker, RN Phone Number: 08/23/2018, 2:53 PM   Clinical Narrative:   73 yr old gentleman that has been hospitalized since 07/26/18 for psychosis. Patient has improved and will discharge home with Home Health services.Patient's family will check on him but not reside with him.    Final next level of care: [place] Barriers to Discharge: No Barriers Identified   Patient Goals and CMS Choice Patient states their goals for this hospitalization and ongoing recovery are:: go home CMS Medicare.gov Compare Post Acute Care list provided to:: Other (Comment Required)(Sons: [name] and [name]) Choice offered to / list presented to : Adult Children  Discharge Placement                       Discharge Plan and Services Discharge Planning Services: CM Consult Post Acute Care Choice: Home Health          DME Arranged: N/A DME Agency: NA HH Arranged: PT, RN, Disease Management [place] Agency: [facility]   Social Determinants of Health (SDOH) Interventions     Readmission Risk Interventions No flowsheet data found.

## 2018-08-23 NOTE — Progress Notes (Signed)
CSW rescinded IVC, MD signed and paperwork on chart.   [place], [place]

## 2018-08-23 NOTE — Progress Notes (Signed)
Internal Medicine Attending:   I saw and examined the patient. I reviewed the resident's note and I agree with the resident's findings and plan as documented in the resident's note.  Patient states that he feels well today with no new complaints.  Patient was initially admitted to the hospital with auditory hallucinations.  Currently he has no hallucinations and is doing well on Risperdal.  Social work follow-up and recommendations appreciated.  Family agreeable to taking patient home with home health.  No further work-up at this time.  Patient stable for DC home today.

## 2018-08-23 NOTE — Plan of Care (Signed)
  Problem: Clinical Measurements: Goal: Ability to maintain clinical measurements within normal limits will improve Outcome: Progressing   Problem: Activity: Goal: Risk for activity intolerance will decrease Outcome: Progressing   Problem: Nutrition: Goal: Adequate nutrition will be maintained Outcome: Progressing   Problem: Elimination: Goal: Will not experience complications related to bowel motility Outcome: Progressing   Problem: Skin Integrity: Goal: Risk for impaired skin integrity will decrease Outcome: Progressing

## 2018-08-23 NOTE — Progress Notes (Addendum)
CSW consulted with patient regarding dc plan and patient called family member Cody Faulkner while in room. Cody Faulkner reports he will be visiting patient at 11:15 am today if CSW can come by then.  CSW plans to meet with Cody Faulkner regarding dc options.   SNF will not accept patient at this time due to extensive psychiatric history. CSW will discuss with family option of an ALF, however this will be around $3000-5000 a month as insurance does not cover ALF. Patient continues to be on wait list for [facility] inpatient psych, which can sometimes take up to 1 month until bed is avail. Patient is currently endorsing no psychiatric symptoms and has not been since managing medications appropriately. If family cannot financially afford ALF, alternative will be home with home health with nurse to follow to ensure patient continues to take psych meds.  Update: Family member Cody Faulkner phoned CSW this morning and reported due to improvement with patient's psych symptoms he is very pleased and in agreement with patient going home with home health. No preference at this time for home health company and has not used a company in the past. Family medicine and RNCM notified of discharge plan.   [place], [place]

## 2018-08-23 NOTE — Discharge Summary (Signed)
Name: Cody Faulkner MRN: [id number] DOB: 05-13-1946 73 y.o. PCP: Cyndi Bender, PA-C  Date of Admission: 07/26/2018 11:08 AM Date of Discharge: 08/23/18 Attending Physician: Annia Belt, MD  Discharge Diagnosis: 1. Depression with psychotic features 2. Vitamin B12 deficiency 3. Diastolic heart failure 4. BPH  Discharge Medications: Allergies as of 08/23/2018      Reactions   Methimazole [methimazole] Itching, Rash   Severe rash      Medication List    STOP taking these medications   albuterol (2.5 MG/3ML) 0.083% nebulizer solution Commonly known as:  PROVENTIL Replaced by:  albuterol 108 (90 Base) MCG/ACT inhaler   cholecalciferol 25 MCG (1000 UT) tablet Commonly known as:  VITAMIN D3   clonazePAM 0.5 MG tablet Commonly known as:  KLONOPIN   furosemide 80 MG tablet Commonly known as:  LASIX   hydrALAZINE 25 MG tablet Commonly known as:  APRESOLINE   loratadine 10 MG tablet Commonly known as:  CLARITIN   losartan 100 MG tablet Commonly known as:  COZAAR   potassium chloride SA 20 MEQ tablet Commonly known as:  K-DUR,KLOR-CON   triamcinolone cream 0.1 % Commonly known as:  KENALOG     TAKE these medications   acetaminophen 500 MG tablet Commonly known as:  TYLENOL Take 500 mg by mouth every 6 (six) hours as needed for moderate pain.   albuterol 108 (90 Base) MCG/ACT inhaler Commonly known as:  PROVENTIL HFA;VENTOLIN HFA Inhale 2 puffs into the lungs every 6 (six) hours as needed for wheezing or shortness of breath. Replaces:  albuterol (2.5 MG/3ML) 0.083% nebulizer solution   allopurinol 100 MG tablet Commonly known as:  ZYLOPRIM Take 100 mg by mouth daily.   amiodarone 200 MG tablet Commonly known as:  PACERONE Take 1 tablet (200 mg total) by mouth daily.   budesonide-formoterol 160-4.5 MCG/ACT inhaler Commonly known as:  SYMBICORT Inhale 2 puffs into the lungs 2 (two) times daily.   cyanocobalamin 1000 MCG tablet Take 1 tablet  (1,000 mcg total) by mouth daily. Start taking on:  August 24, 2018   diclofenac sodium 1 % Gel Commonly known as:  VOLTAREN Apply 2 g topically 4 (four) times daily. What changed:    when to take this  additional instructions   Eliquis 5 MG Tabs tablet Generic drug:  apixaban TAKE 1 TABLET BY MOUTH TWICE A DAY What changed:  how much to take   fenofibrate 160 MG tablet Take 160 mg by mouth daily.   fluticasone 50 MCG/ACT nasal spray Commonly known as:  FLONASE Place 2 sprays into both nostrils daily. Start taking on:  August 24, 2018   pantoprazole 40 MG tablet Commonly known as:  PROTONIX Take 1 tablet (40 mg total) by mouth daily. Start taking on:  August 24, 2018   polyethylene glycol packet Commonly known as:  MIRALAX / GLYCOLAX Take 17 g by mouth daily. Start taking on:  August 24, 2018   risperiDONE 0.5 MG tablet Commonly known as:  RISPERDAL Take 1 tablet (0.5mg ) every morning, and 2 tablets (1mg ) every evening before bed.   Synthroid 150 MCG tablet Generic drug:  levothyroxine Take 150 mcg by mouth daily.   tamsulosin 0.4 MG Caps capsule Commonly known as:  FLOMAX Take 1 capsule (0.4 mg total) by mouth daily. Start taking on:  August 24, 2018       Disposition and follow-up:   Cody Faulkner was discharged from [facility] in Good condition.  At the  hospital follow up visit please address:  1.   Depression with psychotic features --was started on risperdal 0.5mg  qAM and 1mg  qPM by psychiatry --by time of discharge, had resolution of hallucinations --evaluate compliance or difficulties in obtaining medicine  Vitamin B12 deficiency: --started on daily vitamin B 12 supplementation, check in several weeks to assess effect  Diastolic heart failure: Losartan, hydralazine and furosemide were stopped this admission due to stable blood pressure in the low 100's SBP. Please assess ability to restart any of these medications.  BPH:  -  started on tamsulosin for urinary incontinence and retention. Renal function at baseline.  - f/u urinary symptoms, titrate tamsulosin as needed   2.  Labs / imaging needed at time of follow-up: B12, EKG (for qtc while on risperdal)  3.  Pending labs/ test needing follow-up: n/a  Follow-up Appointments: Follow-up Information    Cyndi Bender, PA-C. Schedule an appointment as soon as possible for a visit in 3 week(s).   Specialty:  Physician Assistant Contact information: [address] [phone] [place] Follow up.   Why:  A representative from [facility] will contact you to arrange start date and time for your therapy. Contact information: [place] [address] [phone]           Hospital Course by problem list:  1. Depression w/ Psychotic Features: Cody Faulkner is a 73yo F w/ PMH of PAF on eliquis, dilated cardiomyopathy s/p AICD, HTN, HLD, COPD, CKD3, OSA, Gout and DJD presenting with complaint of seeing a man who was out of kill him. Metabolic and infectious work-up was negative. Conversation with family mentioned that this has been an ongoing complaint for several months. Psych was consulted who recommended starting low dose risperidone and admission to inpatient psych. He had a prolonged stay due to difficulty finding open geri-psych bed. Risperidone was effective in controlling his hallucinations and after discussion with family, he was ultimately discharged home with [facility].   2. Vitamin B12 deficiency: B12 level was found to be low at 77. Oral B12 supplementation as started. Intrinsic Factor antibody was checked for concerns for pernicious anemia which was negative. Discharged w/ recommendation to continue B12 repletion.  3. BPH: urinary incontinence/retention during admission. Started on tamsulosin with improvement of symptoms. Adequate urine output. Renal function at baseline.   Discharge Vitals:    BP 101/82 (BP Location: Right Arm)   Pulse (!) 43   Temp 98.3 F (36.8 C) (Oral)   Resp 14   Ht 5\' 9"  (1.753 m)   Wt 91 kg   SpO2 96%   BMI 29.63 kg/m   Pertinent Labs, Studies, and Procedures:  BMP Latest Ref Rng & Units 08/09/2018 07/29/2018 07/27/2018  Glucose 70 - 99 mg/dL 92 88 89  BUN 8 - 23 mg/dL 26(H) 26(H) 38(H)  Creatinine 0.61 - 1.24 mg/dL 1.30(H) 1.43(H) 1.49(H)  Sodium 135 - 145 mmol/L 135 138 141  Potassium 3.5 - 5.1 mmol/L 4.2 3.7 3.8  Chloride 98 - 111 mmol/L 105 109 109  CO2 22 - 32 mmol/L 23 22 24   Calcium 8.9 - 10.3 mg/dL 8.9 8.8(L) 8.6(L)     Discharge Instructions: Discharge Instructions    (HEART FAILURE PATIENTS) Call MD:  Anytime you have any of the following symptoms: 1) 3 pound weight gain in 24 hours or 5 pounds in 1 week 2) shortness of breath, with or without a dry hacking cough  3) swelling in the hands, feet or stomach 4) if you have to sleep on extra pillows at night in order to breathe.   Complete by:  As directed    Call MD for:  difficulty breathing, headache or visual disturbances   Complete by:  As directed    Call MD for:  extreme fatigue   Complete by:  As directed    Call MD for:  hives   Complete by:  As directed    Call MD for:  persistant dizziness or light-headedness   Complete by:  As directed    Call MD for:  persistant nausea and vomiting   Complete by:  As directed    Call MD for:  redness, tenderness, or signs of infection (pain, swelling, redness, odor or green/yellow discharge around incision site)   Complete by:  As directed    Call MD for:  severe uncontrolled pain   Complete by:  As directed    Call MD for:  temperature >100.4   Complete by:  As directed    Diet - low sodium heart healthy   Complete by:  As directed    Discharge instructions   Complete by:  As directed    Please start taking risperdal 0.5mg  (1 tablet) every morning and 1mg  (2 tablets), every evening before bed.   For your heart, continue taking  amiodarone and isosorbide mononitrate. For now don't restart your lasix (fluid pill), hydralazine, or losartan until you see your PCP in a few weeks.   We will have home health nursing, physical therapy and social work in place and they will report any changes or concerns to your primary care doctor.   Increase activity slowly   Complete by:  As directed       Signed: [name], MD 08/23/2018, 11:39 AM   Pager: [phone]

## 2018-08-23 NOTE — Progress Notes (Signed)
Provided discharge education/instructions, all questions and concerns addressed, Pt not in distress, discharged home with belongings accompanied by relative [name]).

## 2018-08-23 NOTE — Progress Notes (Addendum)
   Subjective: No overnight events. Cody Faulkner reports that he feels well today. He is waiting for nursing staff to help him sit up so that he can eat breakfast. He is unable to sit up by himself. He has no acute concerns today.   Objective:  Vital signs in last 24 hours: Vitals:   08/22/18 1441 08/22/18 1940 08/22/18 2030 08/23/18 0419  BP: (!) 98/55 (!) 110/57  101/82  Pulse: 83 (!) 51  (!) 43  Resp: 16 16  14   Temp: 97.9 F (36.6 C) 98.2 F (36.8 C)  98.3 F (36.8 C)  TempSrc: Oral Oral  Oral  SpO2: 97% 98% 98% 100%  Weight:      Height:       Gen: lying in bed, no distress CV: RRR, no murmurs Abd: soft, NT, ND Psych: appropriate affect, engages well  Assessment/Plan:  Principal Problem:   Psychosis (HCC) Active Problems:   Hypothyroidism   AMS (altered mental status)   B12 deficiency   Hallucinations   Primary osteoarthritis of both knees   History of ventricular tachycardia   Urinary incontinence  # Auditory hallucinations: Patient denies recent VH or AH since starting Risperdal. He denied any acute complaints today.  We are concerned he may not be able to take care of himself at home as this is his 3rd admission for similar symptoms within the past 3 months.  Unfortunately, he does not qualify for SNF and ALF is very expensive out of pocket. After further discussion with family, the plan is to discharge home with HH - Continue Risperdal 0.5mg  AM and 1 mg PM - Appreciate social worker help  # Urinary incontinence/retention: Thought to be secondary to BPH.  Symptoms improving since starting Flomax. Will continue to monitor urinary output and have him follow-up with PCP at discharge for titration of medication as needed   Dispo: Anticipated discharge today with [place].  Cody Course, MD 08/23/2018, 7:12 AM Pager: [phone]

## 2018-08-25 DIAGNOSIS — F323 Major depressive disorder, single episode, severe with psychotic features: Secondary | ICD-10-CM | POA: Diagnosis not present

## 2018-08-25 DIAGNOSIS — E559 Vitamin D deficiency, unspecified: Secondary | ICD-10-CM | POA: Diagnosis not present

## 2018-08-25 DIAGNOSIS — E538 Deficiency of other specified B group vitamins: Secondary | ICD-10-CM | POA: Diagnosis not present

## 2018-08-25 DIAGNOSIS — I4891 Unspecified atrial fibrillation: Secondary | ICD-10-CM | POA: Diagnosis not present

## 2018-08-25 DIAGNOSIS — N400 Enlarged prostate without lower urinary tract symptoms: Secondary | ICD-10-CM | POA: Diagnosis not present

## 2018-08-25 DIAGNOSIS — I5030 Unspecified diastolic (congestive) heart failure: Secondary | ICD-10-CM | POA: Diagnosis not present

## 2018-08-25 DIAGNOSIS — Z9181 History of falling: Secondary | ICD-10-CM | POA: Diagnosis not present

## 2018-08-25 DIAGNOSIS — Z905 Acquired absence of kidney: Secondary | ICD-10-CM | POA: Diagnosis not present

## 2018-08-25 DIAGNOSIS — E7800 Pure hypercholesterolemia, unspecified: Secondary | ICD-10-CM | POA: Diagnosis not present

## 2018-08-25 DIAGNOSIS — Z95810 Presence of automatic (implantable) cardiac defibrillator: Secondary | ICD-10-CM | POA: Diagnosis not present

## 2018-08-25 DIAGNOSIS — I110 Hypertensive heart disease with heart failure: Secondary | ICD-10-CM | POA: Diagnosis not present

## 2018-08-30 DIAGNOSIS — F29 Unspecified psychosis not due to a substance or known physiological condition: Secondary | ICD-10-CM | POA: Diagnosis not present

## 2018-08-30 DIAGNOSIS — R29898 Other symptoms and signs involving the musculoskeletal system: Secondary | ICD-10-CM | POA: Diagnosis not present

## 2018-08-30 DIAGNOSIS — I4891 Unspecified atrial fibrillation: Secondary | ICD-10-CM | POA: Diagnosis not present

## 2018-08-30 DIAGNOSIS — E538 Deficiency of other specified B group vitamins: Secondary | ICD-10-CM | POA: Diagnosis not present

## 2018-08-30 DIAGNOSIS — E039 Hypothyroidism, unspecified: Secondary | ICD-10-CM | POA: Diagnosis not present

## 2018-08-30 DIAGNOSIS — Z79899 Other long term (current) drug therapy: Secondary | ICD-10-CM | POA: Diagnosis not present

## 2018-09-06 ENCOUNTER — Telehealth: Payer: Self-pay | Admitting: Internal Medicine

## 2018-09-06 ENCOUNTER — Other Ambulatory Visit: Payer: Self-pay | Admitting: Internal Medicine

## 2018-09-06 DIAGNOSIS — Z95810 Presence of automatic (implantable) cardiac defibrillator: Secondary | ICD-10-CM

## 2018-09-06 NOTE — Telephone Encounter (Signed)
New Message     *STAT* If patient is at the pharmacy, call can be transferred to refill team.   1. Which medications need to be refilled? (please list name of each medication and dose if known) Amiodarone HCL 200mg    2. Which pharmacy/location (including street and city if local pharmacy) is medication to be sent to? [facility] [place]  3. Do they need a 30 day or 90 day supply? [place]

## 2018-09-14 ENCOUNTER — Other Ambulatory Visit: Payer: Self-pay

## 2018-09-14 ENCOUNTER — Ambulatory Visit (INDEPENDENT_AMBULATORY_CARE_PROVIDER_SITE_OTHER): Payer: Medicare Other | Admitting: *Deleted

## 2018-09-14 DIAGNOSIS — I420 Dilated cardiomyopathy: Secondary | ICD-10-CM

## 2018-09-14 DIAGNOSIS — I5022 Chronic systolic (congestive) heart failure: Secondary | ICD-10-CM

## 2018-09-14 LAB — CUP PACEART REMOTE DEVICE CHECK
Battery Remaining Longevity: 55 mo
Battery Voltage: 2.97 V
Brady Statistic AP VP Percent: 0 %
Brady Statistic AP VS Percent: 0 %
Brady Statistic AS VP Percent: 3.19 %
Brady Statistic AS VS Percent: 96.81 %
Brady Statistic RA Percent Paced: 0 %
Brady Statistic RV Percent Paced: 4.39 %
Date Time Interrogation Session: 20200408084222
HighPow Impedance: 64 Ohm
Implantable Lead Implant Date: 20150820
Implantable Lead Implant Date: 20150820
Implantable Lead Location: 753859
Implantable Lead Location: 753860
Implantable Lead Model: 5076
Implantable Lead Model: 6935
Implantable Pulse Generator Implant Date: 20150820
Lead Channel Impedance Value: 323 Ohm
Lead Channel Impedance Value: 399 Ohm
Lead Channel Impedance Value: 4047 Ohm
Lead Channel Pacing Threshold Amplitude: 0.75 V
Lead Channel Pacing Threshold Amplitude: 2.375 V
Lead Channel Pacing Threshold Pulse Width: 0.4 ms
Lead Channel Pacing Threshold Pulse Width: 0.4 ms
Lead Channel Sensing Intrinsic Amplitude: 0.5 mV
Lead Channel Sensing Intrinsic Amplitude: 0.5 mV
Lead Channel Sensing Intrinsic Amplitude: 12.625 mV
Lead Channel Sensing Intrinsic Amplitude: 12.625 mV
Lead Channel Setting Pacing Amplitude: 2.5 V
Lead Channel Setting Pacing Pulse Width: 0.4 ms
Lead Channel Setting Sensing Sensitivity: 0.3 mV

## 2018-09-23 ENCOUNTER — Telehealth: Payer: Self-pay | Admitting: *Deleted

## 2018-09-23 NOTE — Progress Notes (Signed)
Remote ICD transmission.   

## 2018-09-23 NOTE — Telephone Encounter (Signed)
Call to [facility] for PA for Risperdone 0.5 mg tablets.  Information was given.  Information was given for review. Awaiting decision within 72 hours.  Sander Nephew, RN 09/23/2018 3:09 PM.

## 2018-09-28 DIAGNOSIS — I110 Hypertensive heart disease with heart failure: Secondary | ICD-10-CM | POA: Diagnosis not present

## 2018-09-28 DIAGNOSIS — N400 Enlarged prostate without lower urinary tract symptoms: Secondary | ICD-10-CM | POA: Diagnosis not present

## 2018-09-28 DIAGNOSIS — Z95810 Presence of automatic (implantable) cardiac defibrillator: Secondary | ICD-10-CM | POA: Diagnosis not present

## 2018-09-28 DIAGNOSIS — F323 Major depressive disorder, single episode, severe with psychotic features: Secondary | ICD-10-CM | POA: Diagnosis not present

## 2018-09-28 DIAGNOSIS — I4891 Unspecified atrial fibrillation: Secondary | ICD-10-CM | POA: Diagnosis not present

## 2018-09-28 DIAGNOSIS — E559 Vitamin D deficiency, unspecified: Secondary | ICD-10-CM | POA: Diagnosis not present

## 2018-09-28 DIAGNOSIS — I5030 Unspecified diastolic (congestive) heart failure: Secondary | ICD-10-CM | POA: Diagnosis not present

## 2018-09-28 DIAGNOSIS — E7800 Pure hypercholesterolemia, unspecified: Secondary | ICD-10-CM | POA: Diagnosis not present

## 2018-09-28 DIAGNOSIS — Z905 Acquired absence of kidney: Secondary | ICD-10-CM | POA: Diagnosis not present

## 2018-09-28 DIAGNOSIS — E538 Deficiency of other specified B group vitamins: Secondary | ICD-10-CM | POA: Diagnosis not present

## 2018-09-28 DIAGNOSIS — Z9181 History of falling: Secondary | ICD-10-CM | POA: Diagnosis not present

## 2018-10-06 ENCOUNTER — Telehealth: Payer: Self-pay | Admitting: Physician Assistant

## 2018-10-16 ENCOUNTER — Other Ambulatory Visit: Payer: Self-pay | Admitting: Internal Medicine

## 2018-10-22 ENCOUNTER — Other Ambulatory Visit: Payer: Self-pay | Admitting: Internal Medicine

## 2018-10-29 ENCOUNTER — Other Ambulatory Visit: Payer: Self-pay | Admitting: Internal Medicine

## 2018-12-02 ENCOUNTER — Telehealth: Payer: Self-pay | Admitting: *Deleted

## 2018-12-02 MED ORDER — FUROSEMIDE 80 MG PO TABS: 80.0000 mg | ORAL_TABLET | Freq: Every day | ORAL | 0 refills | Status: DC

## 2018-12-02 NOTE — Telephone Encounter (Signed)
Phoned patient, confirmed that he's been taking lasix 80 mg daily. Medication refilled and follow-up visit scheduled with Dr. Bettina Gavia July 14th in [place].

## 2018-12-02 NOTE — Telephone Encounter (Signed)
Called patient and left message to return call to confirm if he is currently taking furosemide 80 mg daily before refilling [facility]. Will also schedule patient for a follow up appointment as he is due for a visit.

## 2018-12-02 NOTE — Addendum Note (Signed)
Addended by: Polly Cobia A on: 12/02/2018 04:41 PM   Modules accepted: Orders

## 2018-12-14 ENCOUNTER — Ambulatory Visit (INDEPENDENT_AMBULATORY_CARE_PROVIDER_SITE_OTHER): Payer: Medicare Other | Admitting: *Deleted

## 2018-12-14 DIAGNOSIS — I420 Dilated cardiomyopathy: Secondary | ICD-10-CM | POA: Diagnosis not present

## 2018-12-14 LAB — CUP PACEART REMOTE DEVICE CHECK
Battery Remaining Longevity: 52 mo
Battery Voltage: 2.97 V
Brady Statistic AP VP Percent: 0 %
Brady Statistic AP VS Percent: 0 %
Brady Statistic AS VP Percent: 3.23 %
Brady Statistic AS VS Percent: 96.77 %
Brady Statistic RA Percent Paced: 0 %
Brady Statistic RV Percent Paced: 5.52 %
Date Time Interrogation Session: 20200708041801
HighPow Impedance: 61 Ohm
Implantable Lead Implant Date: 20150820
Implantable Lead Implant Date: 20150820
Implantable Lead Location: 753859
Implantable Lead Location: 753860
Implantable Lead Model: 5076
Implantable Lead Model: 6935
Implantable Pulse Generator Implant Date: 20150820
Lead Channel Impedance Value: 342 Ohm
Lead Channel Impedance Value: 437 Ohm
Lead Channel Impedance Value: 988 Ohm
Lead Channel Pacing Threshold Amplitude: 0.75 V
Lead Channel Pacing Threshold Amplitude: 2.375 V
Lead Channel Pacing Threshold Pulse Width: 0.4 ms
Lead Channel Pacing Threshold Pulse Width: 0.4 ms
Lead Channel Sensing Intrinsic Amplitude: 0.75 mV
Lead Channel Sensing Intrinsic Amplitude: 0.75 mV
Lead Channel Sensing Intrinsic Amplitude: 12.375 mV
Lead Channel Sensing Intrinsic Amplitude: 12.375 mV
Lead Channel Setting Pacing Amplitude: 2.5 V
Lead Channel Setting Pacing Pulse Width: 0.4 ms
Lead Channel Setting Sensing Sensitivity: 0.3 mV

## 2018-12-18 NOTE — Progress Notes (Deleted)
Cardiology Office Note:    Date:  12/18/2018   ID:  Cody Faulkner, DOB 07/29/1945, MRN [id number]  PCP:  Cyndi Bender, PA-C  Cardiologist:  Shirlee More, MD    Referring MD: Cyndi Bender, PA-C    ASSESSMENT:    No diagnosis found. PLAN:    In order of problems listed above:  1. ***   Next appointment: ***   Medication Adjustments/Labs and Tests Ordered: Current medicines are reviewed at length with the patient today.  Concerns regarding medicines are outlined above.  No orders of the defined types were placed in this encounter.  No orders of the defined types were placed in this encounter.   No chief complaint on file.   History of Present Illness:    Cody Faulkner is a 73 y.o. male with a hx of a hx of CAD, a dilated cardiomyopathy with heart failure with pacemaker/ICD on amiodarone hypertension hyperlipidemia and atrial fibrillation He has a history of CAD with 60% LAD stenosis.  LV ejection fraction 60%.  In 2017.  Device download June 2019 [facility] had no ventricular tachycardia ventricular fibrillation most recent lab 08/27/2017 show cholesterol 106 LDL 49 normal liver function the echocardiogram in September 2017 showed an EF of 60% mild left atrial enlargement mild to moderate aortic regurgitation.   He was last seen 05/18/2018.  With dysfunction of the atrial lead he is programmed VVI at a rate of 40 bpm with a resting heart rhythm is sinus bradycardia 45 to 50 bpm.  Compliance with diet, lifestyle and medications: *** Past Medical History:  Diagnosis Date  . Asthma    Albuterol prn;Symbicort daily  . Atrial fibrillation ([place])    takes COumadin daily  . Bruises easily    d/t being on Coumadin  . CAD (coronary artery disease)    predominantly single vessel  . Cardiomyopathy, ischemic   . Cataracts, bilateral to be removed in Aug 2015  . Chronic kidney disease 1976   S/P nephrectomy  . Constipation    takes Colace daily as needed  . Depression    . Dilated cardiomyopathy ([place])    s/p defibrillator [employer] [phone]  . Dizziness    occasionally  . GERD (gastroesophageal reflux disease)    takes OTC med if needed  . Gout    takes Allopurinol daily  . Heart failure ([place])    NYHFA     CLASS 3  . History of hyperthyroidism   . Hyperlipidemia    takes Fenofibrate daily  . Hypertension    takes Bisoprolol daily as well as Imdur  . Hypothyroidism    takes Synthroid daily  . Joint pain   . Joint swelling   . Left knee DJD    needs surgery  . Myocardial infarction ([place]) 2007  . OSA (obstructive sleep apnea)    "don't wear mask; can't afford one" (12/20/2013)  . Pacemaker   . Peripheral edema    takes Furosemide daily  . Pneumonia, community acquired last time 2014   "get it ~ q yr; haven't had it yet in 2015" (12/20/2013)  . Seasonal allergies    takes Claritin daily  . Shortness of breath    with exertion  . Solitary kidney 06/05/2006  . Urinary frequency   . Urinary incontinence   . Urinary urgency   . Ventricular tachycardia [facility])    s/p defibrillator-[employer] [id number]    Past Surgical History:  Procedure Laterality Date  . CARDIAC CATHETERIZATION  2007  60% Stenosis mid-CFX  . CARDIAC DEFIBRILLATOR PLACEMENT  2007   [employer] [phone]  . CARDIAC DEFIBRILLATOR REMOVAL  12/20/2013  . CARDIOVERSION  06/18/2011   Procedure: CARDIOVERSION;  Surgeon: Kerry Hough., MD;  Location: [place];  Service: Cardiovascular;  Laterality: N/A;  . CARDIOVERSION N/A 12/06/2012   Procedure: CARDIOVERSION;  Surgeon: Jacolyn Reedy, MD;  Location: [place];  Service: Cardiovascular;  Laterality: N/A;  . COLONOSCOPY    . ICD GENERATOR CHANGE  07/2012  . ICD LEAD REMOVAL Left 12/20/2013   Procedure: ICD LEAD REMOVAL//EXTRACTION AND PLACEMENT OF TEMP/PERM ATRIAL LEAD;  Surgeon: Evans Lance, MD;  Location: [place];  Service: Cardiovascular;  Laterality: Left;  . IMPLANTABLE CARDIOVERTER DEFIBRILLATOR IMPLANT N/A  01/25/2014   Procedure: IMPLANTABLE CARDIOVERTER DEFIBRILLATOR IMPLANT;  Surgeon: Evans Lance, MD;  Location: [facility] CATH LAB;  Service: Cardiovascular;  Laterality: N/A;  . IMPLANTABLE CARDIOVERTER DEFIBRILLATOR REVISION N/A 07/13/2012   Procedure: IMPLANTABLE CARDIOVERTER DEFIBRILLATOR REVISION;  Surgeon: Deboraha Sprang, MD;  Location: [facility] CATH LAB;  Service: Cardiovascular;  Laterality: N/A;  . INSERT / REPLACE / REMOVE PACEMAKER  12/20/2013  . KNEE ARTHROSCOPY Left   . MULTIPLE TOOTH EXTRACTIONS     "top's are all gone; took some off the bottom too"  . NEPHRECTOMY  1976   Right  . TEE WITHOUT CARDIOVERSION  07/13/2012   Procedure: TRANSESOPHAGEAL ECHOCARDIOGRAM (TEE);  Surgeon: Lelon Perla, MD;  Location: [facility] ENDOSCOPY;  Service: Cardiovascular;  Laterality: N/A;    Current Medications: No outpatient medications have been marked as taking for the 12/20/18 encounter (Appointment) with Richardo Priest, MD.     Allergies:   Methimazole [methimazole]   Social History   Socioeconomic History  . Marital status: Single    Spouse name: Not on file  . Number of children: Not on file  . Years of education: Not on file  . Highest education level: Not on file  Occupational History  . Occupation: disabled  Social Needs  . Financial resource strain: Not on file  . Food insecurity    Worry: Not on file    Inability: Not on file  . Transportation needs    Medical: Not on file    Non-medical: Not on file  Tobacco Use  . Smoking status: Never Smoker  . Smokeless tobacco: Never Used  Substance and Sexual Activity  . Alcohol use: Yes    Comment: 12/20/2013 "might have drank a couple beers before; never really drank"  . Drug use: No  . Sexual activity: Not Currently    Birth control/protection: None  Lifestyle  . Physical activity    Days per week: Not on file    Minutes per session: Not on file  . Stress: Not on file  Relationships  . Social Herbalist on phone: Not on file     Gets together: Not on file    Attends religious service: Not on file    Active member of club or organization: Not on file    Attends meetings of clubs or organizations: Not on file    Relationship status: Not on file  Other Topics Concern  . Not on file  Social History Narrative   Never married.  Worked previously in [employer].     Family History: The patient's ***family history includes Diabetes in an other family member; Heart disease in an other family member. ROS:   Please see the history of present illness.  All other systems reviewed and are negative.  EKGs/Labs/Other Studies Reviewed:    The following studies were reviewed today:  EKG:  EKG ordered today and personally reviewed.  The ekg ordered today demonstrates ***  Recent Labs: 05/30/2018: TSH 3.185 07/26/2018: ALT 15 08/09/2018: BUN 26; Creatinine, Ser 1.30; Hemoglobin 11.0; Platelets 191; Potassium 4.2; Sodium 135  Recent Lipid Panel No results found for: CHOL, TRIG, HDL, CHOLHDL, VLDL, LDLCALC, LDLDIRECT  Physical Exam:    VS:  There were no vitals taken for this visit.    Wt Readings from Last 3 Encounters:  08/20/18 200 lb 9.9 oz (91 kg)  07/21/18 200 lb (90.7 kg)  05/30/18 200 lb 6.4 oz (90.9 kg)     GEN: *** Well nourished, well developed in no acute distress HEENT: Normal NECK: No JVD; No carotid bruits LYMPHATICS: No lymphadenopathy CARDIAC: ***RRR, no murmurs, rubs, gallops RESPIRATORY:  Clear to auscultation without rales, wheezing or rhonchi  ABDOMEN: Soft, non-tender, non-distended MUSCULOSKELETAL:  No edema; No deformity  SKIN: Warm and dry NEUROLOGIC:  Alert and oriented x 3 PSYCHIATRIC:  Normal affect    Signed, Shirlee More, MD  12/18/2018 12:47 PM    [place]

## 2018-12-19 ENCOUNTER — Telehealth: Payer: Self-pay | Admitting: Cardiology

## 2018-12-19 NOTE — Telephone Encounter (Signed)
LAM for pt to return call to reschedle appt

## 2018-12-20 ENCOUNTER — Ambulatory Visit: Payer: Medicare Other | Admitting: Cardiology

## 2018-12-26 ENCOUNTER — Encounter: Payer: Self-pay | Admitting: Cardiology

## 2018-12-26 NOTE — Progress Notes (Signed)
Remote ICD transmission.   

## 2019-01-04 NOTE — Progress Notes (Signed)
Cardiology Office Note:    Date:  01/05/2019   ID:  LEVY CEDANO, DOB 12/24/1945, MRN [id number]  PCP:  Cyndi Bender, PA-C  Cardiologist:  Shirlee More, MD    Referring MD: Cyndi Bender, PA-C    ASSESSMENT:    1. Coronary artery disease of native artery of native heart with stable angina pectoris ([place])   2. Dilated cardiomyopathy ([place])   3. Chronic combined systolic and diastolic heart failure (HCC)   4. Paroxysmal atrial fibrillation ([name])   5. On amiodarone therapy   6. Long term current use of anticoagulant therapy   7. Hypertensive heart disease with heart failure ([place])   8. CKD (chronic kidney disease) stage 3, GFR 30-59 ml/min (HCC)   9. Mixed hyperlipidemia    PLAN:    In order of problems listed above:  1. CAD stable New [place] Heart Association class I continue medical therapy including anticoagulant lipid-lowering therapy with fenofibrate at this time I do not think he requires an ischemia evaluation 2. Stable compensated heart failure continue his diuretic check labs renal function potassium and proBNP 3. See above stable compensated 4. Stable maintaining sinus rhythm continue low-dose amiodarone check liver function 5. Continue his anticoagulant 6. Blood pressure target continue current medications including his diuretic 7. Recheck renal function with stage III CKD 8. Check lipid profile fenofibrate   Next appointment: 6 months   Medication Adjustments/Labs and Tests Ordered: Current medicines are reviewed at length with the patient today.  Concerns regarding medicines are outlined above.  No orders of the defined types were placed in this encounter.  No orders of the defined types were placed in this encounter.   No chief complaint on file.   History of Present Illness:    Cody Faulkner is a 73 y.o. male with a hx of  CAD, a dilated cardiomyopathy with heart failure with pacemaker/ICD on amiodarone hypertension hyperlipidemia and atrial  fibrillation last seen by Dr. Wynonia Lawman 11/29/2017.  He has a history of CAD with 60% LAD stenosis.  LV ejection fraction 60%.   He was last seen 05/18/2018. Compliance with diet, lifestyle and medications: Yes Overall unchanged struggles with activities due to leg pain and diffuse weakness and is quite sedentary.  No complaints of edema shortness of breath chest pain palpitation or syncope.  EKG showed sinus bradycardia 48 bpm I reviewed his last device check and he is not being paced frequently.  He will continue low-dose amiodarone with no suppressant rate limiting medication.  He has had no bleeding complication of anticoagulant. Past Medical History:  Diagnosis Date  . Asthma    Albuterol prn;Symbicort daily  . Atrial fibrillation ([place])    takes COumadin daily  . Bruises easily    d/t being on Coumadin  . CAD (coronary artery disease)    predominantly single vessel  . Cardiomyopathy, ischemic   . Cataracts, bilateral to be removed in Aug 2015  . Chronic kidney disease 1976   S/P nephrectomy  . Constipation    takes Colace daily as needed  . Depression   . Dilated cardiomyopathy ([place])    s/p defibrillator [employer] [phone]  . Dizziness    occasionally  . GERD (gastroesophageal reflux disease)    takes OTC med if needed  . Gout    takes Allopurinol daily  . Heart failure ([place])    NYHFA     CLASS 3  . History of hyperthyroidism   . Hyperlipidemia    takes Fenofibrate daily  .  Hypertension    takes Bisoprolol daily as well as Imdur  . Hypothyroidism    takes Synthroid daily  . Joint pain   . Joint swelling   . Left knee DJD    needs surgery  . Myocardial infarction ([place]) 2007  . OSA (obstructive sleep apnea)    "don't wear mask; can't afford one" (12/20/2013)  . Pacemaker   . Peripheral edema    takes Furosemide daily  . Pneumonia, community acquired last time 2014   "get it ~ q yr; haven't had it yet in 2015" (12/20/2013)  . Seasonal allergies    takes Claritin  daily  . Shortness of breath    with exertion  . Solitary kidney 06/05/2006  . Urinary frequency   . Urinary incontinence   . Urinary urgency   . Ventricular tachycardia [facility])    s/p defibrillator-[employer] [id number]    Past Surgical History:  Procedure Laterality Date  . CARDIAC CATHETERIZATION  2007   60% Stenosis mid-CFX  . CARDIAC DEFIBRILLATOR PLACEMENT  2007   [employer] [phone]  . CARDIAC DEFIBRILLATOR REMOVAL  12/20/2013  . CARDIOVERSION  06/18/2011   Procedure: CARDIOVERSION;  Surgeon: Kerry Hough., MD;  Location: [place];  Service: Cardiovascular;  Laterality: N/A;  . CARDIOVERSION N/A 12/06/2012   Procedure: CARDIOVERSION;  Surgeon: Jacolyn Reedy, MD;  Location: [place];  Service: Cardiovascular;  Laterality: N/A;  . COLONOSCOPY    . ICD GENERATOR CHANGE  07/2012  . ICD LEAD REMOVAL Left 12/20/2013   Procedure: ICD LEAD REMOVAL//EXTRACTION AND PLACEMENT OF TEMP/PERM ATRIAL LEAD;  Surgeon: Evans Lance, MD;  Location: [place];  Service: Cardiovascular;  Laterality: Left;  . IMPLANTABLE CARDIOVERTER DEFIBRILLATOR IMPLANT N/A 01/25/2014   Procedure: IMPLANTABLE CARDIOVERTER DEFIBRILLATOR IMPLANT;  Surgeon: Evans Lance, MD;  Location: [facility] CATH LAB;  Service: Cardiovascular;  Laterality: N/A;  . IMPLANTABLE CARDIOVERTER DEFIBRILLATOR REVISION N/A 07/13/2012   Procedure: IMPLANTABLE CARDIOVERTER DEFIBRILLATOR REVISION;  Surgeon: Deboraha Sprang, MD;  Location: [facility] CATH LAB;  Service: Cardiovascular;  Laterality: N/A;  . INSERT / REPLACE / REMOVE PACEMAKER  12/20/2013  . KNEE ARTHROSCOPY Left   . MULTIPLE TOOTH EXTRACTIONS     "top's are all gone; took some off the bottom too"  . NEPHRECTOMY  1976   Right  . TEE WITHOUT CARDIOVERSION  07/13/2012   Procedure: TRANSESOPHAGEAL ECHOCARDIOGRAM (TEE);  Surgeon: Lelon Perla, MD;  Location: [facility] ENDOSCOPY;  Service: Cardiovascular;  Laterality: N/A;    Current Medications: Current Meds  Medication Sig  .  acetaminophen (TYLENOL) 500 MG tablet Take 500 mg by mouth every 6 (six) hours as needed for moderate pain.   Marland Kitchen albuterol (PROVENTIL HFA;VENTOLIN HFA) 108 (90 Base) MCG/ACT inhaler Inhale 2 puffs into the lungs every 6 (six) hours as needed for wheezing or shortness of breath.  . allopurinol (ZYLOPRIM) 100 MG tablet Take 100 mg by mouth daily.   Marland Kitchen amiodarone (PACERONE) 200 MG tablet TAKE 1 TABLET BY MOUTH EVERY DAY  . budesonide-formoterol (SYMBICORT) 160-4.5 MCG/ACT inhaler Inhale 2 puffs into the lungs 2 (two) times daily.  . diclofenac sodium (VOLTAREN) 1 % GEL Apply 2 g topically 4 (four) times daily. (Patient taking differently: Apply 2 g topically 2 (two) times daily. For pain apply to both knees)  . ELIQUIS 5 MG TABS tablet TAKE 1 TABLET BY MOUTH TWICE A DAY  . fenofibrate 160 MG tablet Take 160 mg by mouth daily.  . fluticasone (FLONASE) 50 MCG/ACT nasal spray  Place 2 sprays into both nostrils daily.  . furosemide (LASIX) 80 MG tablet Take 1 tablet (80 mg total) by mouth daily.  Marland Kitchen KLOR-CON M20 20 MEQ tablet Take 20 mEq by mouth daily.  . pantoprazole (PROTONIX) 40 MG tablet Take 1 tablet (40 mg total) by mouth daily.  . polyethylene glycol (MIRALAX / GLYCOLAX) packet Take 17 g by mouth daily.  . risperiDONE (RISPERDAL) 1 MG tablet TAKE 1/2 (ONE HALF) TABLET BY MOUTH EVERY MORNING AND 1 TAB AT BEDTIME  . SYNTHROID 150 MCG tablet Take 150 mcg by mouth daily.   . tamsulosin (FLOMAX) 0.4 MG CAPS capsule Take 1 capsule (0.4 mg total) by mouth daily.  . vitamin B-12 1000 MCG tablet Take 1 tablet (1,000 mcg total) by mouth daily.     Allergies:   Methimazole [methimazole]   Social History   Socioeconomic History  . Marital status: Single    Spouse name: Not on file  . Number of children: Not on file  . Years of education: Not on file  . Highest education level: Not on file  Occupational History  . Occupation: disabled  Social Needs  . Financial resource strain: Not on file  . Food  insecurity    Worry: Not on file    Inability: Not on file  . Transportation needs    Medical: Not on file    Non-medical: Not on file  Tobacco Use  . Smoking status: Never Smoker  . Smokeless tobacco: Never Used  Substance and Sexual Activity  . Alcohol use: Yes    Comment: 12/20/2013 "might have drank a couple beers before; never really drank"  . Drug use: No  . Sexual activity: Not Currently    Birth control/protection: None  Lifestyle  . Physical activity    Days per week: Not on file    Minutes per session: Not on file  . Stress: Not on file  Relationships  . Social Herbalist on phone: Not on file    Gets together: Not on file    Attends religious service: Not on file    Active member of club or organization: Not on file    Attends meetings of clubs or organizations: Not on file    Relationship status: Not on file  Other Topics Concern  . Not on file  Social History Narrative   Never married.  Worked previously in [employer].     Family History: The patient's family history includes Diabetes in an other family member; Heart disease in an other family member. ROS:   Please see the history of present illness.    All other systems reviewed and are negative.  EKGs/Labs/Other Studies Reviewed:    The following studies were reviewed today:  EKG:  EKG ordered today and personally reviewed.  The ekg ordered today demonstrates sinus bradycardia 48 bpm  Recent Labs: 05/30/2018: TSH 3.185 07/26/2018: ALT 15 08/09/2018: BUN 26; Creatinine, Ser 1.30; Hemoglobin 11.0; Platelets 191; Potassium 4.2; Sodium 135  Recent Lipid Panel No results found for: CHOL, TRIG, HDL, CHOLHDL, VLDL, LDLCALC, LDLDIRECT  Physical Exam:    VS:  Temp (!) 97 F (36.1 C)   Wt 202 lb 6.4 oz (91.8 kg)   BMI 29.89 kg/m     Wt Readings from Last 3 Encounters:  01/05/19 202 lb 6.4 oz (91.8 kg)  08/20/18 200 lb 9.9 oz (91 kg)  07/21/18 200 lb (90.7 kg)     GEN: He looks  chronically ill  and debilitated n no acute distress HEENT: Normal NECK: No JVD; No carotid bruits LYMPHATICS: No lymphadenopathy CARDIAC: Soft S1 RRR, no murmurs, rubs, gallops RESPIRATORY:  Clear to auscultation without rales, wheezing or rhonchi  ABDOMEN: Soft, non-tender, non-distended MUSCULOSKELETAL:  No edema; No deformity  SKIN: Warm and dry NEUROLOGIC:  Alert and oriented x 3 PSYCHIATRIC:  Normal affect    Signed, Shirlee More, MD  01/05/2019 10:33 AM    [place]

## 2019-01-05 ENCOUNTER — Other Ambulatory Visit: Payer: Self-pay

## 2019-01-05 ENCOUNTER — Ambulatory Visit (INDEPENDENT_AMBULATORY_CARE_PROVIDER_SITE_OTHER): Payer: Medicare Other | Admitting: Cardiology

## 2019-01-05 VITALS — BP 144/66 | HR 48 | Temp 97.0°F | Ht 65.0 in | Wt 202.4 lb

## 2019-01-05 DIAGNOSIS — I25118 Atherosclerotic heart disease of native coronary artery with other forms of angina pectoris: Secondary | ICD-10-CM | POA: Diagnosis not present

## 2019-01-05 DIAGNOSIS — I5042 Chronic combined systolic (congestive) and diastolic (congestive) heart failure: Secondary | ICD-10-CM | POA: Diagnosis not present

## 2019-01-05 DIAGNOSIS — E782 Mixed hyperlipidemia: Secondary | ICD-10-CM

## 2019-01-05 DIAGNOSIS — I480 Paroxysmal atrial fibrillation: Secondary | ICD-10-CM | POA: Diagnosis not present

## 2019-01-05 DIAGNOSIS — Z79899 Other long term (current) drug therapy: Secondary | ICD-10-CM

## 2019-01-05 DIAGNOSIS — I420 Dilated cardiomyopathy: Secondary | ICD-10-CM | POA: Diagnosis not present

## 2019-01-05 DIAGNOSIS — Z7901 Long term (current) use of anticoagulants: Secondary | ICD-10-CM

## 2019-01-05 DIAGNOSIS — N183 Chronic kidney disease, stage 3 unspecified: Secondary | ICD-10-CM

## 2019-01-05 DIAGNOSIS — I110 Hypertensive heart disease with heart failure: Secondary | ICD-10-CM

## 2019-01-05 NOTE — Patient Instructions (Signed)
Medication Instructions:  Your physician recommends that you continue on your current medications as directed. Please refer to the Current Medication list given to you today.  If you need a refill on your cardiac medications before your next appointment, please call your pharmacy.   Lab work: Your physician recommends that you return for lab work in: TODAY CMP,CBC, Pro BNP  If you have labs (blood work) drawn today and your tests are completely normal, you will receive your results only by: Marland Kitchen MyChart Message (if you have MyChart) OR . A paper copy in the mail If you have any lab test that is abnormal or we need to change your treatment, we will call you to review the results.  Testing/Procedures: None  Follow-Up: At [facility], you and your health needs are our priority.  As part of our continuing mission to provide you with exceptional heart care, we have created designated Provider Care Teams.  These Care Teams include your primary Cardiologist (physician) and Advanced Practice Providers (APPs -  Physician Assistants and Nurse Practitioners) who all work together to provide you with the care you need, when you need it. You will need a follow up appointment in 6 months.   Any Other Special Instructions Will Be Listed Below (If Applicable).

## 2019-01-06 ENCOUNTER — Telehealth: Payer: Self-pay

## 2019-01-06 LAB — COMPREHENSIVE METABOLIC PANEL
ALT: 12 IU/L (ref 0–44)
AST: 16 IU/L (ref 0–40)
Albumin/Globulin Ratio: 2.2 (ref 1.2–2.2)
Albumin: 4.3 g/dL (ref 3.7–4.7)
Alkaline Phosphatase: 58 IU/L (ref 39–117)
BUN/Creatinine Ratio: 21 (ref 10–24)
BUN: 26 mg/dL (ref 8–27)
Bilirubin Total: 0.6 mg/dL (ref 0.0–1.2)
CO2: 22 mmol/L (ref 20–29)
Calcium: 9.3 mg/dL (ref 8.6–10.2)
Chloride: 106 mmol/L (ref 96–106)
Creatinine, Ser: 1.24 mg/dL (ref 0.76–1.27)
GFR calc Af Amer: 66 mL/min/{1.73_m2} (ref >59)
GFR calc non Af Amer: 57 mL/min/{1.73_m2} — ABNORMAL LOW (ref >59)
Globulin, Total: 2 g/dL (ref 1.5–4.5)
Glucose: 87 mg/dL (ref 65–99)
Potassium: 4.4 mmol/L (ref 3.5–5.2)
Sodium: 142 mmol/L (ref 134–144)
Total Protein: 6.3 g/dL (ref 6.0–8.5)

## 2019-01-06 LAB — CBC
Hematocrit: 35.7 % — ABNORMAL LOW (ref 37.5–51.0)
Hemoglobin: 12.1 g/dL — ABNORMAL LOW (ref 13.0–17.7)
MCH: 29.9 pg (ref 26.6–33.0)
MCHC: 33.9 g/dL (ref 31.5–35.7)
MCV: 88 fL (ref 79–97)
Platelets: 196 10*3/uL (ref 150–450)
RBC: 4.05 x10E6/uL — ABNORMAL LOW (ref 4.14–5.80)
RDW: 13.2 % (ref 11.6–15.4)
WBC: 5.9 10*3/uL (ref 3.4–10.8)

## 2019-01-06 LAB — PRO B NATRIURETIC PEPTIDE: NT-Pro BNP: 314 pg/mL (ref 0–376)

## 2019-01-06 NOTE — Telephone Encounter (Signed)
Patient called for results °

## 2019-01-06 NOTE — Telephone Encounter (Signed)
lmtcb for results. 

## 2019-01-06 NOTE — Telephone Encounter (Signed)
-----   Message from Brian J Munley, MD sent at 01/06/2019  8:50 AM EDT ----- Good result no changes in treatment 

## 2019-01-09 NOTE — Telephone Encounter (Signed)
Left detailed message with results on patient's home phone per DPR. Advised patient to continue current medications as prescribed and contact our office with any further questions or concerns.

## 2019-01-15 ENCOUNTER — Other Ambulatory Visit: Payer: Self-pay | Admitting: Internal Medicine

## 2019-01-15 DIAGNOSIS — I480 Paroxysmal atrial fibrillation: Secondary | ICD-10-CM

## 2019-01-16 NOTE — Telephone Encounter (Signed)
3m 91.8kg Scr 1.24 01/06/19 Lovw/munley 01/05/19

## 2019-01-25 ENCOUNTER — Other Ambulatory Visit: Payer: Self-pay | Admitting: Cardiology

## 2019-01-31 DIAGNOSIS — R29898 Other symptoms and signs involving the musculoskeletal system: Secondary | ICD-10-CM | POA: Diagnosis not present

## 2019-01-31 DIAGNOSIS — E039 Hypothyroidism, unspecified: Secondary | ICD-10-CM | POA: Diagnosis not present

## 2019-01-31 DIAGNOSIS — I4891 Unspecified atrial fibrillation: Secondary | ICD-10-CM | POA: Diagnosis not present

## 2019-01-31 DIAGNOSIS — F29 Unspecified psychosis not due to a substance or known physiological condition: Secondary | ICD-10-CM | POA: Diagnosis not present

## 2019-02-03 DIAGNOSIS — J45909 Unspecified asthma, uncomplicated: Secondary | ICD-10-CM | POA: Diagnosis not present

## 2019-02-03 DIAGNOSIS — F323 Major depressive disorder, single episode, severe with psychotic features: Secondary | ICD-10-CM | POA: Diagnosis not present

## 2019-02-03 DIAGNOSIS — E538 Deficiency of other specified B group vitamins: Secondary | ICD-10-CM | POA: Diagnosis not present

## 2019-02-03 DIAGNOSIS — F29 Unspecified psychosis not due to a substance or known physiological condition: Secondary | ICD-10-CM | POA: Diagnosis not present

## 2019-02-03 DIAGNOSIS — I5030 Unspecified diastolic (congestive) heart failure: Secondary | ICD-10-CM | POA: Diagnosis not present

## 2019-02-03 DIAGNOSIS — I4891 Unspecified atrial fibrillation: Secondary | ICD-10-CM | POA: Diagnosis not present

## 2019-02-03 DIAGNOSIS — N400 Enlarged prostate without lower urinary tract symptoms: Secondary | ICD-10-CM | POA: Diagnosis not present

## 2019-02-03 DIAGNOSIS — E039 Hypothyroidism, unspecified: Secondary | ICD-10-CM | POA: Diagnosis not present

## 2019-02-03 DIAGNOSIS — R29898 Other symptoms and signs involving the musculoskeletal system: Secondary | ICD-10-CM | POA: Diagnosis not present

## 2019-02-03 DIAGNOSIS — Z905 Acquired absence of kidney: Secondary | ICD-10-CM | POA: Diagnosis not present

## 2019-02-03 DIAGNOSIS — I2510 Atherosclerotic heart disease of native coronary artery without angina pectoris: Secondary | ICD-10-CM | POA: Diagnosis not present

## 2019-02-03 DIAGNOSIS — R001 Bradycardia, unspecified: Secondary | ICD-10-CM | POA: Diagnosis not present

## 2019-02-03 DIAGNOSIS — M109 Gout, unspecified: Secondary | ICD-10-CM | POA: Diagnosis not present

## 2019-02-03 DIAGNOSIS — I110 Hypertensive heart disease with heart failure: Secondary | ICD-10-CM | POA: Diagnosis not present

## 2019-02-03 DIAGNOSIS — E559 Vitamin D deficiency, unspecified: Secondary | ICD-10-CM | POA: Diagnosis not present

## 2019-02-03 DIAGNOSIS — M1712 Unilateral primary osteoarthritis, left knee: Secondary | ICD-10-CM | POA: Diagnosis not present

## 2019-03-06 DIAGNOSIS — N400 Enlarged prostate without lower urinary tract symptoms: Secondary | ICD-10-CM | POA: Diagnosis not present

## 2019-03-06 DIAGNOSIS — E559 Vitamin D deficiency, unspecified: Secondary | ICD-10-CM | POA: Diagnosis not present

## 2019-03-06 DIAGNOSIS — E538 Deficiency of other specified B group vitamins: Secondary | ICD-10-CM | POA: Diagnosis not present

## 2019-03-06 DIAGNOSIS — I110 Hypertensive heart disease with heart failure: Secondary | ICD-10-CM | POA: Diagnosis not present

## 2019-03-06 DIAGNOSIS — M1712 Unilateral primary osteoarthritis, left knee: Secondary | ICD-10-CM | POA: Diagnosis not present

## 2019-03-06 DIAGNOSIS — E039 Hypothyroidism, unspecified: Secondary | ICD-10-CM | POA: Diagnosis not present

## 2019-03-06 DIAGNOSIS — R001 Bradycardia, unspecified: Secondary | ICD-10-CM | POA: Diagnosis not present

## 2019-03-06 DIAGNOSIS — R29898 Other symptoms and signs involving the musculoskeletal system: Secondary | ICD-10-CM | POA: Diagnosis not present

## 2019-03-06 DIAGNOSIS — J45909 Unspecified asthma, uncomplicated: Secondary | ICD-10-CM | POA: Diagnosis not present

## 2019-03-06 DIAGNOSIS — I4891 Unspecified atrial fibrillation: Secondary | ICD-10-CM | POA: Diagnosis not present

## 2019-03-06 DIAGNOSIS — I2510 Atherosclerotic heart disease of native coronary artery without angina pectoris: Secondary | ICD-10-CM | POA: Diagnosis not present

## 2019-03-06 DIAGNOSIS — I5030 Unspecified diastolic (congestive) heart failure: Secondary | ICD-10-CM | POA: Diagnosis not present

## 2019-03-06 DIAGNOSIS — F323 Major depressive disorder, single episode, severe with psychotic features: Secondary | ICD-10-CM | POA: Diagnosis not present

## 2019-03-06 DIAGNOSIS — Z905 Acquired absence of kidney: Secondary | ICD-10-CM | POA: Diagnosis not present

## 2019-03-06 DIAGNOSIS — F29 Unspecified psychosis not due to a substance or known physiological condition: Secondary | ICD-10-CM | POA: Diagnosis not present

## 2019-03-06 DIAGNOSIS — M109 Gout, unspecified: Secondary | ICD-10-CM | POA: Diagnosis not present

## 2019-03-15 ENCOUNTER — Ambulatory Visit (INDEPENDENT_AMBULATORY_CARE_PROVIDER_SITE_OTHER): Payer: Medicare Other | Admitting: *Deleted

## 2019-03-15 DIAGNOSIS — I472 Ventricular tachycardia: Secondary | ICD-10-CM

## 2019-03-15 DIAGNOSIS — I480 Paroxysmal atrial fibrillation: Secondary | ICD-10-CM

## 2019-03-15 DIAGNOSIS — I4729 Other ventricular tachycardia: Secondary | ICD-10-CM

## 2019-03-16 LAB — CUP PACEART REMOTE DEVICE CHECK
Battery Remaining Longevity: 47 mo
Battery Voltage: 2.97 V
Brady Statistic AP VP Percent: 0 %
Brady Statistic AP VS Percent: 0 %
Brady Statistic AS VP Percent: 5.43 %
Brady Statistic AS VS Percent: 94.57 %
Brady Statistic RA Percent Paced: 0 %
Brady Statistic RV Percent Paced: 11.4 %
Date Time Interrogation Session: 20201007073623
HighPow Impedance: 63 Ohm
Implantable Lead Implant Date: 20150820
Implantable Lead Implant Date: 20150820
Implantable Lead Location: 753859
Implantable Lead Location: 753860
Implantable Lead Model: 5076
Implantable Lead Model: 6935
Implantable Pulse Generator Implant Date: 20150820
Lead Channel Impedance Value: 342 Ohm
Lead Channel Impedance Value: 399 Ohm
Lead Channel Impedance Value: 817 Ohm
Lead Channel Pacing Threshold Amplitude: 0.75 V
Lead Channel Pacing Threshold Amplitude: 2.375 V
Lead Channel Pacing Threshold Pulse Width: 0.4 ms
Lead Channel Pacing Threshold Pulse Width: 0.4 ms
Lead Channel Sensing Intrinsic Amplitude: 0.5 mV
Lead Channel Sensing Intrinsic Amplitude: 0.5 mV
Lead Channel Sensing Intrinsic Amplitude: 11.125 mV
Lead Channel Sensing Intrinsic Amplitude: 11.125 mV
Lead Channel Setting Pacing Amplitude: 2.5 V
Lead Channel Setting Pacing Pulse Width: 0.4 ms
Lead Channel Setting Sensing Sensitivity: 0.3 mV

## 2019-03-24 NOTE — Progress Notes (Signed)
Remote ICD transmission.   

## 2019-04-13 DIAGNOSIS — Z0000 Encounter for general adult medical examination without abnormal findings: Secondary | ICD-10-CM | POA: Diagnosis not present

## 2019-04-13 DIAGNOSIS — Z9181 History of falling: Secondary | ICD-10-CM | POA: Diagnosis not present

## 2019-04-13 DIAGNOSIS — Z1331 Encounter for screening for depression: Secondary | ICD-10-CM | POA: Diagnosis not present

## 2019-04-13 DIAGNOSIS — Z125 Encounter for screening for malignant neoplasm of prostate: Secondary | ICD-10-CM | POA: Diagnosis not present

## 2019-04-18 DIAGNOSIS — Z23 Encounter for immunization: Secondary | ICD-10-CM | POA: Diagnosis not present

## 2019-04-23 ENCOUNTER — Inpatient Hospital Stay (HOSPITAL_COMMUNITY)
Admission: EM | Admit: 2019-04-23 | Discharge: 2019-04-28 | DRG: 177 | Disposition: A | Discharge status: Skilled Nursing Facility | Payer: Medicare Other | Attending: Family Medicine | Admitting: Family Medicine

## 2019-04-23 ENCOUNTER — Other Ambulatory Visit: Payer: Self-pay

## 2019-04-23 ENCOUNTER — Encounter (HOSPITAL_COMMUNITY): Payer: Self-pay | Admitting: Emergency Medicine

## 2019-04-23 ENCOUNTER — Emergency Department (HOSPITAL_COMMUNITY): Payer: Medicare Other

## 2019-04-23 DIAGNOSIS — K219 Gastro-esophageal reflux disease without esophagitis: Secondary | ICD-10-CM | POA: Diagnosis not present

## 2019-04-23 DIAGNOSIS — G4733 Obstructive sleep apnea (adult) (pediatric): Secondary | ICD-10-CM | POA: Diagnosis present

## 2019-04-23 DIAGNOSIS — Z7901 Long term (current) use of anticoagulants: Secondary | ICD-10-CM | POA: Diagnosis not present

## 2019-04-23 DIAGNOSIS — E039 Hypothyroidism, unspecified: Secondary | ICD-10-CM | POA: Diagnosis present

## 2019-04-23 DIAGNOSIS — Z888 Allergy status to other drugs, medicaments and biological substances status: Secondary | ICD-10-CM

## 2019-04-23 DIAGNOSIS — Z743 Need for continuous supervision: Secondary | ICD-10-CM | POA: Diagnosis not present

## 2019-04-23 DIAGNOSIS — R079 Chest pain, unspecified: Secondary | ICD-10-CM | POA: Diagnosis not present

## 2019-04-23 DIAGNOSIS — Z8679 Personal history of other diseases of the circulatory system: Secondary | ICD-10-CM | POA: Diagnosis not present

## 2019-04-23 DIAGNOSIS — I252 Old myocardial infarction: Secondary | ICD-10-CM | POA: Diagnosis not present

## 2019-04-23 DIAGNOSIS — E669 Obesity, unspecified: Secondary | ICD-10-CM | POA: Diagnosis present

## 2019-04-23 DIAGNOSIS — I959 Hypotension, unspecified: Secondary | ICD-10-CM | POA: Diagnosis not present

## 2019-04-23 DIAGNOSIS — Z515 Encounter for palliative care: Secondary | ICD-10-CM | POA: Diagnosis not present

## 2019-04-23 DIAGNOSIS — I130 Hypertensive heart and chronic kidney disease with heart failure and stage 1 through stage 4 chronic kidney disease, or unspecified chronic kidney disease: Secondary | ICD-10-CM | POA: Diagnosis not present

## 2019-04-23 DIAGNOSIS — Z6830 Body mass index (BMI) 30.0-30.9, adult: Secondary | ICD-10-CM

## 2019-04-23 DIAGNOSIS — R001 Bradycardia, unspecified: Secondary | ICD-10-CM | POA: Diagnosis not present

## 2019-04-23 DIAGNOSIS — W19XXXA Unspecified fall, initial encounter: Secondary | ICD-10-CM | POA: Diagnosis present

## 2019-04-23 DIAGNOSIS — R52 Pain, unspecified: Secondary | ICD-10-CM

## 2019-04-23 DIAGNOSIS — I480 Paroxysmal atrial fibrillation: Secondary | ICD-10-CM | POA: Diagnosis not present

## 2019-04-23 DIAGNOSIS — Y92009 Unspecified place in unspecified non-institutional (private) residence as the place of occurrence of the external cause: Secondary | ICD-10-CM

## 2019-04-23 DIAGNOSIS — Z79890 Hormone replacement therapy (postmenopausal): Secondary | ICD-10-CM | POA: Diagnosis not present

## 2019-04-23 DIAGNOSIS — M109 Gout, unspecified: Secondary | ICD-10-CM | POA: Diagnosis present

## 2019-04-23 DIAGNOSIS — Z66 Do not resuscitate: Secondary | ICD-10-CM | POA: Diagnosis not present

## 2019-04-23 DIAGNOSIS — N183 Chronic kidney disease, stage 3 unspecified: Secondary | ICD-10-CM | POA: Diagnosis not present

## 2019-04-23 DIAGNOSIS — J690 Pneumonitis due to inhalation of food and vomit: Secondary | ICD-10-CM | POA: Diagnosis not present

## 2019-04-23 DIAGNOSIS — I25118 Atherosclerotic heart disease of native coronary artery with other forms of angina pectoris: Secondary | ICD-10-CM | POA: Diagnosis not present

## 2019-04-23 DIAGNOSIS — N1831 Chronic kidney disease, stage 3a: Secondary | ICD-10-CM | POA: Diagnosis not present

## 2019-04-23 DIAGNOSIS — G9341 Metabolic encephalopathy: Secondary | ICD-10-CM | POA: Diagnosis not present

## 2019-04-23 DIAGNOSIS — Z79899 Other long term (current) drug therapy: Secondary | ICD-10-CM

## 2019-04-23 DIAGNOSIS — Z7951 Long term (current) use of inhaled steroids: Secondary | ICD-10-CM | POA: Diagnosis not present

## 2019-04-23 DIAGNOSIS — R0789 Other chest pain: Secondary | ICD-10-CM | POA: Diagnosis not present

## 2019-04-23 DIAGNOSIS — E785 Hyperlipidemia, unspecified: Secondary | ICD-10-CM | POA: Diagnosis present

## 2019-04-23 DIAGNOSIS — Z905 Acquired absence of kidney: Secondary | ICD-10-CM | POA: Diagnosis not present

## 2019-04-23 DIAGNOSIS — F0390 Unspecified dementia without behavioral disturbance: Secondary | ICD-10-CM | POA: Diagnosis present

## 2019-04-23 DIAGNOSIS — R930 Abnormal findings on diagnostic imaging of skull and head, not elsewhere classified: Secondary | ICD-10-CM | POA: Diagnosis not present

## 2019-04-23 DIAGNOSIS — J45909 Unspecified asthma, uncomplicated: Secondary | ICD-10-CM | POA: Diagnosis present

## 2019-04-23 DIAGNOSIS — R4020 Unspecified coma: Secondary | ICD-10-CM | POA: Diagnosis not present

## 2019-04-23 DIAGNOSIS — M170 Bilateral primary osteoarthritis of knee: Secondary | ICD-10-CM | POA: Diagnosis present

## 2019-04-23 DIAGNOSIS — I5032 Chronic diastolic (congestive) heart failure: Secondary | ICD-10-CM

## 2019-04-23 DIAGNOSIS — R296 Repeated falls: Secondary | ICD-10-CM | POA: Diagnosis present

## 2019-04-23 DIAGNOSIS — R279 Unspecified lack of coordination: Secondary | ICD-10-CM | POA: Diagnosis not present

## 2019-04-23 DIAGNOSIS — Z7189 Other specified counseling: Secondary | ICD-10-CM

## 2019-04-23 DIAGNOSIS — Z20828 Contact with and (suspected) exposure to other viral communicable diseases: Secondary | ICD-10-CM | POA: Diagnosis present

## 2019-04-23 DIAGNOSIS — I420 Dilated cardiomyopathy: Secondary | ICD-10-CM | POA: Diagnosis not present

## 2019-04-23 DIAGNOSIS — Z95810 Presence of automatic (implantable) cardiac defibrillator: Secondary | ICD-10-CM | POA: Diagnosis present

## 2019-04-23 DIAGNOSIS — I2510 Atherosclerotic heart disease of native coronary artery without angina pectoris: Secondary | ICD-10-CM | POA: Diagnosis not present

## 2019-04-23 DIAGNOSIS — H9190 Unspecified hearing loss, unspecified ear: Secondary | ICD-10-CM | POA: Diagnosis present

## 2019-04-23 DIAGNOSIS — R55 Syncope and collapse: Secondary | ICD-10-CM | POA: Diagnosis not present

## 2019-04-23 DIAGNOSIS — N400 Enlarged prostate without lower urinary tract symptoms: Secondary | ICD-10-CM | POA: Diagnosis present

## 2019-04-23 LAB — CBC WITH DIFFERENTIAL/PLATELET
Abs Immature Granulocytes: 0.07 10*3/uL (ref 0.00–0.07)
Basophils Absolute: 0 10*3/uL (ref 0.0–0.1)
Basophils Relative: 0 %
Eosinophils Absolute: 0 10*3/uL (ref 0.0–0.5)
Eosinophils Relative: 0 %
HCT: 38.2 % — ABNORMAL LOW (ref 39.0–52.0)
Hemoglobin: 12.3 g/dL — ABNORMAL LOW (ref 13.0–17.0)
Immature Granulocytes: 1 %
Lymphocytes Relative: 3 %
Lymphs Abs: 0.4 10*3/uL — ABNORMAL LOW (ref 0.7–4.0)
MCH: 29 pg (ref 26.0–34.0)
MCHC: 32.2 g/dL (ref 30.0–36.0)
MCV: 90.1 fL (ref 80.0–100.0)
Monocytes Absolute: 0.9 10*3/uL (ref 0.1–1.0)
Monocytes Relative: 8 %
Neutro Abs: 9.2 10*3/uL — ABNORMAL HIGH (ref 1.7–7.7)
Neutrophils Relative %: 88 %
Platelets: 232 10*3/uL (ref 150–400)
RBC: 4.24 MIL/uL (ref 4.22–5.81)
RDW: 13.2 % (ref 11.5–15.5)
WBC: 10.5 10*3/uL (ref 4.0–10.5)
nRBC: 0 % (ref 0.0–0.2)

## 2019-04-23 LAB — URINALYSIS, ROUTINE W REFLEX MICROSCOPIC
Bacteria, UA: NONE SEEN
Bilirubin Urine: NEGATIVE
Glucose, UA: NEGATIVE mg/dL
Hgb urine dipstick: NEGATIVE
Ketones, ur: NEGATIVE mg/dL
Nitrite: NEGATIVE
Protein, ur: NEGATIVE mg/dL
Specific Gravity, Urine: 1.043 — ABNORMAL HIGH (ref 1.005–1.030)
WBC, UA: >50 WBC/hpf — ABNORMAL HIGH (ref 0–5)
pH: 5 (ref 5.0–8.0)

## 2019-04-23 LAB — COMPREHENSIVE METABOLIC PANEL
ALT: 20 U/L (ref 0–44)
AST: 22 U/L (ref 15–41)
Albumin: 3.2 g/dL — ABNORMAL LOW (ref 3.5–5.0)
Alkaline Phosphatase: 42 U/L (ref 38–126)
Anion gap: 14 (ref 5–15)
BUN: 35 mg/dL — ABNORMAL HIGH (ref 8–23)
CO2: 21 mmol/L — ABNORMAL LOW (ref 22–32)
Calcium: 9.1 mg/dL (ref 8.9–10.3)
Chloride: 106 mmol/L (ref 98–111)
Creatinine, Ser: 1.53 mg/dL — ABNORMAL HIGH (ref 0.61–1.24)
GFR calc Af Amer: 51 mL/min — ABNORMAL LOW (ref >60)
GFR calc non Af Amer: 44 mL/min — ABNORMAL LOW (ref >60)
Glucose, Bld: 108 mg/dL — ABNORMAL HIGH (ref 70–99)
Potassium: 3.7 mmol/L (ref 3.5–5.1)
Sodium: 141 mmol/L (ref 135–145)
Total Bilirubin: 1.9 mg/dL — ABNORMAL HIGH (ref 0.3–1.2)
Total Protein: 6.8 g/dL (ref 6.5–8.1)

## 2019-04-23 LAB — TROPONIN I (HIGH SENSITIVITY)
Troponin I (High Sensitivity): 13 ng/L (ref <18)
Troponin I (High Sensitivity): 16 ng/L (ref <18)

## 2019-04-23 LAB — SARS CORONAVIRUS 2 (TAT 6-24 HRS): SARS Coronavirus 2: NEGATIVE

## 2019-04-23 LAB — STREP PNEUMONIAE URINARY ANTIGEN: Strep Pneumo Urinary Antigen: NEGATIVE

## 2019-04-23 LAB — BRAIN NATRIURETIC PEPTIDE: B Natriuretic Peptide: 76.5 pg/mL (ref 0.0–100.0)

## 2019-04-23 LAB — CK: Total CK: 237 U/L (ref 49–397)

## 2019-04-23 LAB — TSH: TSH: 1.186 u[IU]/mL (ref 0.350–4.500)

## 2019-04-23 LAB — LACTIC ACID, PLASMA
Lactic Acid, Venous: 0.9 mmol/L (ref 0.5–1.9)
Lactic Acid, Venous: 0.7 mmol/L (ref 0.5–1.9)

## 2019-04-23 LAB — PROCALCITONIN: Procalcitonin: 0.11 ng/mL

## 2019-04-23 MED ORDER — FUROSEMIDE 20 MG PO TABS: 80.0000 mg | ORAL_TABLET | Freq: Every day | ORAL | Status: DC

## 2019-04-23 MED ORDER — LEVOTHYROXINE SODIUM 75 MCG PO TABS
150.0000 ug | ORAL_TABLET | Freq: Every day | ORAL | Status: DC
  Administered 2019-04-24 – 2019-04-28 (×5): 150 ug via ORAL
  Filled 2019-04-23 (×5): qty 2

## 2019-04-23 MED ORDER — ONDANSETRON HCL 4 MG/2ML IJ SOLN: 4.0000 mg | Freq: Four times a day (QID) | INTRAMUSCULAR | Status: DC | PRN

## 2019-04-23 MED ORDER — ONDANSETRON HCL 4 MG PO TABS: 4.0000 mg | ORAL_TABLET | Freq: Four times a day (QID) | ORAL | Status: DC | PRN

## 2019-04-23 MED ORDER — SODIUM CHLORIDE 0.9 % IV SOLN
Freq: Once | INTRAVENOUS | Status: AC
  Administered 2019-04-23: 20:00:00 via INTRAVENOUS

## 2019-04-23 MED ORDER — FENOFIBRATE 160 MG PO TABS
160.0000 mg | ORAL_TABLET | Freq: Every day | ORAL | Status: DC
  Administered 2019-04-24 – 2019-04-28 (×5): 160 mg via ORAL
  Filled 2019-04-23 (×5): qty 1

## 2019-04-23 MED ORDER — ALBUTEROL SULFATE (2.5 MG/3ML) 0.083% IN NEBU: 2.5000 mg | INHALATION_SOLUTION | Freq: Four times a day (QID) | RESPIRATORY_TRACT | Status: DC | PRN

## 2019-04-23 MED ORDER — RISPERIDONE 1 MG PO TABS
1.0000 mg | ORAL_TABLET | Freq: Every day | ORAL | Status: DC
  Administered 2019-04-23 – 2019-04-27 (×5): 1 mg via ORAL
  Filled 2019-04-23 (×6): qty 1

## 2019-04-23 MED ORDER — SODIUM CHLORIDE 0.9 % IV SOLN
3.0000 g | Freq: Three times a day (TID) | INTRAVENOUS | Status: DC
  Administered 2019-04-23 – 2019-04-26 (×9): 3 g via INTRAVENOUS
  Filled 2019-04-23: qty 8
  Filled 2019-04-23: qty 3
  Filled 2019-04-23 (×3): qty 8
  Filled 2019-04-23: qty 3
  Filled 2019-04-23: qty 8
  Filled 2019-04-23 (×2): qty 3
  Filled 2019-04-23: qty 8
  Filled 2019-04-23: qty 3

## 2019-04-23 MED ORDER — SODIUM CHLORIDE 0.9% FLUSH: 10.0000 mL | INTRAVENOUS | Status: DC | PRN

## 2019-04-23 MED ORDER — SODIUM CHLORIDE 0.9% FLUSH
3.0000 mL | Freq: Two times a day (BID) | INTRAVENOUS | Status: DC
  Administered 2019-04-24 – 2019-04-25 (×2): 3 mL via INTRAVENOUS

## 2019-04-23 MED ORDER — APIXABAN 5 MG PO TABS
5.0000 mg | ORAL_TABLET | Freq: Two times a day (BID) | ORAL | Status: DC
  Administered 2019-04-23 – 2019-04-28 (×10): 5 mg via ORAL
  Filled 2019-04-23 (×11): qty 1

## 2019-04-23 MED ORDER — RISPERIDONE 0.5 MG PO TABS: 0.5000 mg | ORAL_TABLET | ORAL | Status: DC

## 2019-04-23 MED ORDER — ACETAMINOPHEN 325 MG PO TABS
650.0000 mg | ORAL_TABLET | Freq: Four times a day (QID) | ORAL | Status: DC | PRN
  Administered 2019-04-26: 650 mg via ORAL
  Filled 2019-04-23: qty 2

## 2019-04-23 MED ORDER — DICLOFENAC SODIUM 1 % EX GEL
2.0000 g | Freq: Two times a day (BID) | CUTANEOUS | Status: DC
  Administered 2019-04-24 – 2019-04-28 (×7): 2 g via TOPICAL
  Filled 2019-04-23: qty 100

## 2019-04-23 MED ORDER — RISPERIDONE 0.5 MG PO TABS
0.5000 mg | ORAL_TABLET | Freq: Every morning | ORAL | Status: DC
  Administered 2019-04-24 – 2019-04-28 (×5): 0.5 mg via ORAL
  Filled 2019-04-23 (×5): qty 1

## 2019-04-23 MED ORDER — IOHEXOL 300 MG/ML  SOLN
100.0000 mL | Freq: Once | INTRAMUSCULAR | Status: AC | PRN
  Administered 2019-04-23: 100 mL via INTRAVENOUS

## 2019-04-23 MED ORDER — SODIUM CHLORIDE 0.9% FLUSH
10.0000 mL | Freq: Two times a day (BID) | INTRAVENOUS | Status: DC
  Administered 2019-04-24 – 2019-04-27 (×5): 10 mL

## 2019-04-23 MED ORDER — SODIUM CHLORIDE 0.9 % IV SOLN: Freq: Once | INTRAVENOUS | Status: DC

## 2019-04-23 MED ORDER — ACETAMINOPHEN 650 MG RE SUPP
650.0000 mg | Freq: Four times a day (QID) | RECTAL | Status: DC | PRN
  Filled 2019-04-23: qty 1

## 2019-04-23 MED ORDER — GUAIFENESIN ER 600 MG PO TB12
600.0000 mg | ORAL_TABLET | Freq: Two times a day (BID) | ORAL | Status: DC
  Administered 2019-04-23 – 2019-04-26 (×7): 600 mg via ORAL
  Filled 2019-04-23 (×8): qty 1

## 2019-04-23 MED ORDER — AMIODARONE HCL 200 MG PO TABS
200.0000 mg | ORAL_TABLET | Freq: Every day | ORAL | Status: DC
  Administered 2019-04-25 – 2019-04-28 (×4): 200 mg via ORAL
  Filled 2019-04-23 (×5): qty 1

## 2019-04-23 MED ORDER — DICLOFENAC SODIUM 1 % TD GEL: 2.0000 g | Freq: Two times a day (BID) | TRANSDERMAL | Status: DC

## 2019-04-23 MED ORDER — PANTOPRAZOLE SODIUM 40 MG PO TBEC
40.0000 mg | DELAYED_RELEASE_TABLET | Freq: Every day | ORAL | Status: DC
  Administered 2019-04-23 – 2019-04-26 (×4): 40 mg via ORAL
  Filled 2019-04-23 (×5): qty 1

## 2019-04-23 MED ORDER — POTASSIUM CHLORIDE CRYS ER 20 MEQ PO TBCR
20.0000 meq | EXTENDED_RELEASE_TABLET | Freq: Every day | ORAL | Status: DC
  Administered 2019-04-23 – 2019-04-28 (×6): 20 meq via ORAL
  Filled 2019-04-23 (×6): qty 1

## 2019-04-23 MED ORDER — TAMSULOSIN HCL 0.4 MG PO CAPS
0.4000 mg | ORAL_CAPSULE | Freq: Every day | ORAL | Status: DC
  Administered 2019-04-24 – 2019-04-28 (×5): 0.4 mg via ORAL
  Filled 2019-04-23 (×5): qty 1

## 2019-04-23 NOTE — Progress Notes (Signed)
Unable to perform scan due to unsafe implanted Cardiac Defibrillator Model:[id number]. Nurse Thurmond Butts  is aware and said he would notify ordering Dr. Shari Heritage scanned.

## 2019-04-23 NOTE — ED Notes (Signed)
Pt POA Erick [phone]

## 2019-04-23 NOTE — H&P (Addendum)
History and Physical    Cody Faulkner [id number] DOB: 1945/06/29 DOA: 04/23/2019  Referring MD/NP/PA: Verlee Rossetti, MD PCP: Cyndi Bender, PA-C  Patient coming from: Home via EMS  Chief Complaint: Fall  I have personally briefly reviewed patient's old medical records in [place]   HPI: Cody Faulkner is a 73 y.o. male with medical history significant of hypertension, paroxysmal atrial fibrillation on Eliquis, CAD, cardiomyopathy, ventricular tachycardia s/p AICD, CKD stage III, s/p right nephrectomy OSA, hypothyroidism, gout, and arthritis.  He presents after being found face down at home on the ground family this morning. He apparently fell sometime last night and was unable to get up.  At baseline patient lives alone. He does not recall how he fell, but does complain of knee pain.  Previously,noted to have complained of pain all over.  Denied any focal weakness, fever, cough, chest pain, or abdominal pain.  ED Course: Interrogation of the patient's AICD did not reveal any acute arrhythmias.  Chest x-ray was concerning for the possibility of a developing pneumonia or aspiration pneumonitis. Patient was started on antibiotics of Unasyn.  Initial CT scan of the brain was concerning for right cerebellar and temporal lobe infarcts.  Patient was unable to have a MRI of the brain because of his AICD.  TRH called to admit.  Review of systems: A complete 10 point review of systems was performed and negative except for as noted above in HPI.  Past Medical History:  Diagnosis Date   Asthma    Albuterol prn;Symbicort daily   Atrial fibrillation (HCC)    takes COumadin daily   Bruises easily    d/t being on Coumadin   CAD (coronary artery disease)    predominantly single vessel   Cardiomyopathy, ischemic    Cataracts, bilateral to be removed in Aug 2015   Chronic kidney disease 1976   S/P nephrectomy   Constipation    takes Colace daily as needed   Depression     Dilated cardiomyopathy ([place])    s/p defibrillator [employer] EnTrust [id number]   Dizziness    occasionally   GERD (gastroesophageal reflux disease)    takes OTC med if needed   Gout    takes Allopurinol daily   Heart failure ([place])    NYHFA     CLASS 3   History of hyperthyroidism    Hyperlipidemia    takes Fenofibrate daily   Hypertension    takes Bisoprolol daily as well as Imdur   Hypothyroidism    takes Synthroid daily   Joint pain    Joint swelling    Left knee DJD    needs surgery   Myocardial infarction ([place]) 2007   OSA (obstructive sleep apnea)    "don't wear mask; can't afford one" (12/20/2013)   Pacemaker    Peripheral edema    takes Furosemide daily   Pneumonia, community acquired last time 2014   "get it ~ q yr; haven't had it yet in 2015" (12/20/2013)   Seasonal allergies    takes Claritin daily   Shortness of breath    with exertion   Solitary kidney 06/05/2006   Urinary frequency    Urinary incontinence    Urinary urgency    Ventricular tachycardia ([place])    s/p defibrillator-Medtronic EnTrust [id number]    Past Surgical History:  Procedure Laterality Date   CARDIAC CATHETERIZATION  2007   60% Stenosis mid-CFX   CARDIAC DEFIBRILLATOR PLACEMENT  2007   Medtronic EnTrust [phone]  CARDIAC DEFIBRILLATOR REMOVAL  12/20/2013   CARDIOVERSION  06/18/2011   Procedure: CARDIOVERSION;  Surgeon: Kerry Hough., MD;  Location: [facility];  Service: Cardiovascular;  Laterality: N/A;   CARDIOVERSION N/A 12/06/2012   Procedure: CARDIOVERSION;  Surgeon: Jacolyn Reedy, MD;  Location: [facility];  Service: Cardiovascular;  Laterality: N/A;   COLONOSCOPY     ICD GENERATOR CHANGE  07/2012   ICD LEAD REMOVAL Left 12/20/2013   Procedure: ICD LEAD REMOVAL//EXTRACTION AND PLACEMENT OF TEMP/PERM ATRIAL LEAD;  Surgeon: Evans Lance, MD;  Location: [facility];  Service: Cardiovascular;  Laterality: Left;   IMPLANTABLE CARDIOVERTER DEFIBRILLATOR IMPLANT N/A  01/25/2014   Procedure: IMPLANTABLE CARDIOVERTER DEFIBRILLATOR IMPLANT;  Surgeon: Evans Lance, MD;  Location[facility] CATH LAB;  Service: Cardiovascular;  Laterality: N/A;   IMPLANTABLE CARDIOVERTER DEFIBRILLATOR REVISION N/A 07/13/2012   Procedure: IMPLANTABLE CARDIOVERTER DEFIBRILLATOR REVISION;  Surgeon: Deboraha Sprang, MD;  Location: [facility] LAB;  Service: Cardiovascular;  Laterality: N/A;   INSERT / REPLACE / REMOVE PACEMAKER  12/20/2013   KNEE ARTHROSCOPY Left    MULTIPLE TOOTH EXTRACTIONS     "top's are all gone; took some off the bottom too"   NEPHRECTOMY  1976   Right   TEE WITHOUT CARDIOVERSION  07/13/2012   Procedure: TRANSESOPHAGEAL ECHOCARDIOGRAM (TEE);  Surgeon: Lelon Perla, MD;  Location[facility] ENDOSCOPY;  Service: Cardiovascular;  Laterality: N/A;     reports that he has never smoked. He has never used smokeless tobacco. He reports current alcohol use. He reports that he does not use drugs.  Allergies  Allergen Reactions   Methimazole [Methimazole] Itching and Rash    Severe rash    Family History  Problem Relation Age of Onset   Heart disease Other        uncle   Diabetes Other        uncle    Prior to Admission medications   Medication Sig Start Date End Date Taking? Authorizing Provider  acetaminophen (TYLENOL) 500 MG tablet Take 500 mg by mouth every 6 (six) hours as needed for moderate pain.     [provider]  albuterol (PROVENTIL HFA;VENTOLIN HFA) 108 (90 Base) MCG/ACT inhaler Inhale 2 puffs into the lungs every 6 (six) hours as needed for wheezing or shortness of breath. 08/23/18   Alphonzo Grieve, MD  allopurinol (ZYLOPRIM) 100 MG tablet Take 100 mg by mouth daily.  10/28/13   [provider]  amiodarone (PACERONE) 200 MG tablet TAKE 1 TABLET BY MOUTH EVERY DAY 09/06/18   Evans Lance, MD  budesonide-formoterol [facility]) 160-4.5 MCG/ACT inhaler Inhale 2 puffs into the lungs 2 (two) times daily.    [provider]    diclofenac sodium (VOLTAREN) 1 % GEL Apply 2 g topically 4 (four) times daily. Patient taking differently: Apply 2 g topically 2 (two) times daily. For pain apply to both knees 07/15/18   [name], MD  ELIQUIS 5 MG TABS tablet TAKE 1 TABLET BY MOUTH TWICE A DAY 01/16/19   Richardo Priest, MD  fenofibrate 160 MG tablet Take 160 mg by mouth daily.    [provider]  fluticasone (FLONASE) 50 MCG/ACT nasal spray Place 2 sprays into both nostrils daily. 08/24/18   Alphonzo Grieve, MD  furosemide (LASIX) 80 MG tablet TAKE 1 TABLET BY MOUTH EVERY DAY 01/25/19   Richardo Priest, MD  KLOR-CON M20 20 MEQ tablet Take 20 mEq by mouth daily. 12/04/18   [provider]  pantoprazole (PROTONIX) 40 MG tablet Take 1 tablet (40 mg total) by mouth daily. 08/24/18   Alphonzo Grieve, MD  polyethylene glycol (MIRALAX / GLYCOLAX) packet Take 17 g by mouth daily. 08/24/18   Alphonzo Grieve, MD  risperiDONE (RISPERDAL) 1 MG tablet TAKE 1/2 (ONE HALF) TABLET BY MOUTH EVERY MORNING AND 1 TAB AT BEDTIME 11/03/18   [provider]  SYNTHROID 150 MCG tablet Take 150 mcg by mouth daily.  01/29/18   [provider]  tamsulosin (FLOMAX) 0.4 MG CAPS capsule Take 1 capsule (0.4 mg total) by mouth daily. 08/24/18   Alphonzo Grieve, MD  vitamin B-12 1000 MCG tablet Take 1 tablet (1,000 mcg total) by mouth daily. 08/24/18   Alphonzo Grieve, MD    Physical Exam:  Constitutional: Elderly male who appears to be in no acute distress at this time and able answer questions. Vitals:   04/23/19 1215 04/23/19 1230 04/23/19 1315 04/23/19 1330  BP: (!) 99/47 (!) 101/54 (!) 125/53 (!) 101/44  Pulse: (!) 43 (!) 44    Resp: 17 17 16 15   Temp:      TempSrc:      SpO2: 96% 96%    Weight:      Height:       Eyes: PERRL, lids and conjunctivae normal ENMT: Mucous membranes are moist. Posterior pharynx clear of any exudate or lesions.Normal dentition.  Neck: normal, supple, no masses, no thyromegaly.  No JVD  appreciated. Respiratory: clear to auscultation bilaterally, no wheezing, no crackles. Normal respiratory effort. No accessory muscle use.  Cardiovascular: Bradycardic, no murmurs / rubs / gallops. No extremity edema. 2+ pedal pulses. No carotid bruits.  Abdomen: no tenderness, no masses palpated. No hepatosplenomegaly. Bowel sounds positive.  Musculoskeletal: no clubbing / cyanosis.  Significant crepitation noted in the right knee with decreased range of motion. Skin: Venous stasis present of the bilateral lower legs.  Abrasion to the face. Neurologic: CN 2-12 grossly intact. Sensation intact, DTR normal.  Patient able to move all 4 extremities, but strength Psychiatric: Poor recent memory. Alert and oriented x 3. Normal mood.     Labs on Admission: I have personally reviewed following labs and imaging studies  CBC: Recent Labs  Lab 04/23/19 0915  WBC 10.5  NEUTROABS 9.2*  HGB 12.3*  HCT 38.2*  MCV 90.1  PLT [id number]   Basic Metabolic Panel: Recent Labs  Lab 04/23/19 0915  NA 141  K 3.7  CL 106  CO2 21*  GLUCOSE 108*  BUN 35*  CREATININE 1.53*  CALCIUM 9.1   GFR: Estimated Creatinine Clearance: 43 mL/min (A) (by C-G formula based on SCr of 1.53 mg/dL (H)). Liver Function Tests: Recent Labs  Lab 04/23/19 0915  AST 22  ALT 20  ALKPHOS 42  BILITOT 1.9*  PROT 6.8  ALBUMIN 3.2*   No results for input(s): LIPASE, AMYLASE in the last 168 hours. No results for input(s): AMMONIA in the last 168 hours. Coagulation Profile: No results for input(s): INR, PROTIME in the last 168 hours. Cardiac Enzymes: Recent Labs  Lab 04/23/19 0915  CKTOTAL 237   BNP (last 3 results) Recent Labs    01/05/19 1112  PROBNP 314   HbA1C: No results for input(s): HGBA1C in the last 72 hours. CBG: No results for input(s): GLUCAP in the last 168 hours. Lipid Profile: No results for input(s): CHOL, HDL, LDLCALC, TRIG, CHOLHDL, LDLDIRECT in the last 72 hours. Thyroid Function Tests: No  results for input(s): TSH, T4TOTAL, FREET4, T3FREE, THYROIDAB in  the last 72 hours. Anemia Panel: No results for input(s): VITAMINB12, FOLATE, FERRITIN, TIBC, IRON, RETICCTPCT in the last 72 hours. Urine analysis:    Component Value Date/Time   COLORURINE YELLOW 08/20/2018 1057   APPEARANCEUR CLEAR 08/20/2018 1057   LABSPEC 1.010 08/20/2018 1057   PHURINE 6.0 08/20/2018 1057   GLUCOSEU NEGATIVE 08/20/2018 1057   HGBUR NEGATIVE 08/20/2018 1057   BILIRUBINUR NEGATIVE 08/20/2018 1057   KETONESUR NEGATIVE 08/20/2018 1057   PROTEINUR NEGATIVE 08/20/2018 1057   UROBILINOGEN 1.0 12/19/2013 1603   NITRITE NEGATIVE 08/20/2018 1057   LEUKOCYTESUR NEGATIVE 08/20/2018 1057   Sepsis Labs: No results found for this or any previous visit (from the past 240 hour(s)).   Radiological Exams on Admission: Ct Head Wo Contrast  Result Date: 04/23/2019 CLINICAL DATA:  Neck pain and left facial bruising following a fall last night. EXAM: CT HEAD WITHOUT CONTRAST CT CERVICAL SPINE WITHOUT CONTRAST TECHNIQUE: Multidetector CT imaging of the head and cervical spine was performed following the standard protocol without intravenous contrast. Multiplanar CT image reconstructions of the cervical spine were also generated. COMPARISON:  07/08/2018. FINDINGS: CT HEAD FINDINGS Brain: Interval low density in the right cerebellum and ill-defined low density in the right temporal lobe. It is difficult to determine much of this is due to photon starvation at the skull base with the patient's head tilted Stable mildly enlarged ventricles and cortical sulci. No intracranial hemorrhage or mass lesion. Vascular: No hyperdense vessel or unexpected calcification. Skull: No skull fracture. Sinuses/Orbits: Mild mucosal thickening involving all of the paranasal sinuses. Status post bilateral cataract extraction. Other: None. CT CERVICAL SPINE FINDINGS Alignment: Stable grade 1 retrolisthesis at the C5-6 level. Mild dextroconvex cervical  scoliosis due to the patient's head tilted to the left, unchanged. Skull base and vertebrae: No acute fracture. No primary bone lesion or focal pathologic process. Soft tissues and spinal canal: No prevertebral fluid or swelling. No visible canal hematoma. Disc levels:  Multilevel degenerative changes. Upper chest: The included portions of the upper lobes are clear. Other: Bilateral carotid artery atheromatous calcifications. IMPRESSION: 1. 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 This could all be artifactual due to photon starvation related to the patient's head tilted in the gantry. However, acute infarction cannot be excluded in either area. If this is a clinical concern, recommend further evaluation with MRI of the brain. 2. No skull fracture or intracranial hemorrhage. 3. No cervical spine fracture or traumatic subluxation. 4. Stable mild diffuse cerebral and cerebellar atrophy. 5. Multilevel cervical spine degenerative changes. 6. Mild chronic pansinusitis. 7. Bilateral carotid artery atheromatous calcifications. Electronically Signed   By: Claudie Revering M.D.   On: 04/23/2019 10:34   Ct Cervical Spine Wo Contrast  Result Date: 04/23/2019 CLINICAL DATA:  Neck pain and left facial bruising following a fall last night. EXAM: CT HEAD WITHOUT CONTRAST CT CERVICAL SPINE WITHOUT CONTRAST TECHNIQUE: Multidetector CT imaging of the head and cervical spine was performed following the standard protocol without intravenous contrast. Multiplanar CT image reconstructions of the cervical spine were also generated. COMPARISON:  07/08/2018. FINDINGS: CT HEAD FINDINGS Brain: Interval low density in the right cerebellum and ill-defined low density in the right temporal lobe. It is difficult to determine much of this is due to photon starvation at the skull base with the patient's head tilted Stable mildly enlarged ventricles and cortical sulci. No intracranial  hemorrhage or mass lesion. Vascular: No hyperdense vessel or unexpected calcification. Skull: No skull  fracture. Sinuses/Orbits: Mild mucosal thickening involving all of the paranasal sinuses. Status post bilateral cataract extraction. Other: None. CT CERVICAL SPINE FINDINGS Alignment: Stable grade 1 retrolisthesis at the C5-6 level. Mild dextroconvex cervical scoliosis due to the patient's head tilted to the left, unchanged. Skull base and vertebrae: No acute fracture. No primary bone lesion or focal pathologic process. Soft tissues and spinal canal: No prevertebral fluid or swelling. No visible canal hematoma. Disc levels:  Multilevel degenerative changes. Upper chest: The included portions of the upper lobes are clear. Other: Bilateral carotid artery atheromatous calcifications. IMPRESSION: 1. 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 This could all be artifactual due to photon starvation related to the patient's head tilted in the gantry. However, acute infarction cannot be excluded in either area. If this is a clinical concern, recommend further evaluation with MRI of the brain. 2. No skull fracture or intracranial hemorrhage. 3. No cervical spine fracture or traumatic subluxation. 4. Stable mild diffuse cerebral and cerebellar atrophy. 5. Multilevel cervical spine degenerative changes. 6. Mild chronic pansinusitis. 7. Bilateral carotid artery atheromatous calcifications. Electronically Signed   By: Claudie Revering M.D.   On: 04/23/2019 10:34   Ct Abdomen Pelvis W Contrast  Result Date: 04/23/2019 CLINICAL DATA:  Gas and fluid collection inferior to the right inferior pubic ramus on a portable pelvis radiograph earlier today. The patient fell. EXAM: CT ABDOMEN AND PELVIS WITH CONTRAST TECHNIQUE: Multidetector CT imaging of the abdomen and pelvis was performed using the standard protocol following bolus administration of intravenous contrast. CONTRAST:   177mL OMNIPAQUE IOHEXOL 300 MG/ML  SOLN COMPARISON:  Pelvis radiograph and portable chest radiograph obtained earlier today. Abdomen and pelvis CT dated 03/04/2009. FINDINGS: Lower chest: Dense consolidation in the right lower lobe with air bronchograms. Small right pleural effusion. Enlarged heart. Intracardiac pacer and AICD leads. Hepatobiliary: No focal liver abnormality is seen. No gallstones, gallbladder wall thickening, or biliary dilatation. Pancreas: Unremarkable. No pancreatic ductal dilatation or surrounding inflammatory changes. Spleen: Normal in size without focal abnormality. Adrenals/Urinary Tract: Stable post nephrectomy changes on the right. Normal appearing adrenal glands and left kidney. Interval tortuosity and mild focal dilatation of the distal left ureter, proximal to the ureterovesical junction. The distal ureter is normal in caliber. Mild diffuse bladder wall thickening with 2 small interval diverticula posteriorly, 1 on the left and 1 on the right. Stomach/Bowel: Unremarkable stomach, small bowel and colon. No evidence of appendicitis. Vascular/Lymphatic: Atheromatous arterial calcifications without aneurysm. No enlarged lymph nodes. Reproductive: Normal sized prostate gland containing small, coarse calcifications. Large bilateral hydroceles. Other: Small left inguinal hernia containing fat. No gas seen inferior to the right pubic ramus or in the proximal right thigh. Musculoskeletal: Lumbar and lower thoracic spine degenerative changes. IMPRESSION: 1. Dense consolidation in the right lower lobe with air bronchograms, compatible with pneumonia or dense atelectasis. 2. Small right pleural effusion. 3. No gas seen inferior to the right inferior pubic ramus. The appearance seen previously was due to air trapped beneath the soft tissues of the buttock on the right. 4. Stable post nephrectomy changes on the right. 5. Mild diffuse bladder wall thickening with 2 small interval diverticula  posteriorly. 6. Large bilateral hydroceles. 7. Small left inguinal hernia containing fat. Electronically Signed   By: Claudie Revering M.D.   On: 04/23/2019 13:41   Dg Pelvis Portable  Addendum Date: 04/23/2019   ADDENDUM REPORT: 04/23/2019 11:13 ADDENDUM: These results were called by telephone at the time  of interpretation on 04/23/2019 at 11:13 am to provider [facility] , who verbally acknowledged these results. Electronically Signed   By: Zetta Bills M.D.   On: 04/23/2019 11:13   Result Date: 04/23/2019 CLINICAL DATA:  Fall. EXAM: PORTABLE PELVIS 1-2 VIEWS COMPARISON:  Abdomen pelvis CT from 2010. FINDINGS: Bubbly lucency just inferior to the right inferior pubic ramus measuring 7.4 x 3.5 cm. No acute bone finding. Hips are located with mild to moderate degenerative changes. IMPRESSION: Gas and fluid collection inferior to the right inferior pubic ramus may represent a soft tissue infection involving pelvic floor or upper thigh, transperineal hernia containing bowel could also have this appearance. Consider CT for further assessment. Electronically Signed: By: Zetta Bills M.D. On: 04/23/2019 10:58   Dg Chest Portable 1 View  Result Date: 04/23/2019 CLINICAL DATA:  Fall. EXAM: PORTABLE CHEST 1 VIEW COMPARISON:  Chest x-ray 07/26/2018 FINDINGS: Hazy opacity at the right lung base with subtle increased interstitial markings and increased retrocardiac opacification as compared to previous study. Similar appearance at the left lung base. Upper chest is clear bilaterally. Cardiomediastinal contours stable with cardiac enlargement. Right-sided dual lead pacer device remains in place. No acute bone finding. IMPRESSION: 1. Interstitial airspace opacities at the lung bases suspicious for developing developing pneumonia or pneumonitis from aspiration. Electronically Signed   By: Zetta Bills M.D.   On: 04/23/2019 10:54    EKG: Independently reviewed.  Sinus bradycardia at 50 bpm QTc  440  Assessment/Plan Suspected aspiration pneumonia.  Patient presents after having a fall.  Found to have interstitial airspace opacities in the lung bases concerning for pneumonia or pneumonitis secondary to aspiration.    WBC elevated at the upper limit of normal at 10.5. He was empirically started on Unasyn. -Admit to medical telemetry bed -Aspiration precaution -Follow-up sputum cultures -Check procalcitonin -Continue empiric antibiotics of Unasyn  Acute encephalopathy, question of a stroke: Patient has limited recollection of how he fell last night.  At this time patient alert and oriented at least to person, place, and time.  Initial CT imaging of the brain did show concern for strokes, but noted possibly secondary to artifact.  Discussed with neurology who recommended repeating the CT of the brain in a.m. and consulting if it appears patient has had no strokes. -Neurochecks -Recheck CT scan of the brain without contrast in a.m.  Fall: Patient presents after being found down at home.  Suspect that the patient fell.  He was noted to have CK within normal limits.  No fracture seen on imaging studies.  Interrogation of the patient's AICD did not show any significant arrhythmias. -Up with assistance -PT/OT to evaluate and treat -Unclear if it is safe for the patient stay at home alone  Paroxysmal atrial fibrillation on chronic anticoagulation: Patient appears rate controlled currently but heart rates were noted to be in the 50s. -Continue Eliquis and amiodarone  Renal insufficiency vs. Acute kidney injury superimposed on chronic kidney disease stage III, status post right nephrectomy: Patient presents with creatinine 1.53 and BUN 35.  Elevated BUN to creatinine ratio suggest a prerenal cause of symptoms.  His baseline creatinine previously noted to be 1.24 in July. -Gentle IV fluids of normal saline at 50 mL/h for total of 500 mL IV fluid -Recheck creatinine in a.m. -Hold nephrotoxic  agent  Essential hypertension: Blood pressure currently controlled.  Patient normally on furosemide 80 mg daily and -Held furosemide consider restarting in a.m.  Hypothyroidism -Check TSH -Continue levothyroxine  History of  ventricular tachycardia, status post AICD: No arrhythmias noted on interrogation. -Continue to monitor  Normocytic anemia: Hemoglobin 12.3 which appears near baseline. -Continue to monitor  Diastolic CHF: Last EF noted to be around 60% with grade 1 diastolic dysfunction in [id number].  Patient does not appear grossly fluid overloaded at this time. -Strict intake and output -Daily weights -Restart furosemide when medically appropriate  Dyslipidemia -Continue fenofibrate  BPH -Continue Flomax  GERD -Continue Protonix  Obesity: BMI 30.67 kg/m  DVT prophylaxis: Eliquis Code Status: Full  Family Communication: No family present at bedside Disposition Plan: TBD  Consults called: Neurology Admission status: Inpatient  Norval Morton MD [facility] Pager [phone]   If 7PM-7AM, please contact night-coverage [email or web address] Password [facility]  04/23/2019, 2:31 PM

## 2019-04-23 NOTE — ED Provider Notes (Signed)
[facility] EMERGENCY DEPARTMENT Provider Note   CSN: [id number] Arrival date & time: 04/23/19  0900     History   Chief Complaint Chief Complaint  Patient presents with  . Fall    HPI Cody Faulkner is a 73 y.o. male.  Presents to the ER after unwitnessed fall.  Patient reports he fell sometime last night, but cannot recall the details.  Family member reported that he was last seen yesterday around 7 in the evening.  Family found him lying on the ground face down this morning.  Patient states that he feels sore all over but does not have any 1 particular place of pain.  He denies any chest pain or abdominal pain.  Is unsure how he fell.  Denies any numbness, weakness, vision changes or speech changes.  A. fib on Eliquis, coronary artery disease, cardiomyopathy, CKD s/p nephrectomy, heart failure, V. tach s/p device placement     HPI  Past Medical History:  Diagnosis Date  . Asthma    Albuterol prn;Symbicort daily  . Atrial fibrillation ([place])    takes COumadin daily  . Bruises easily    d/t being on Coumadin  . CAD (coronary artery disease)    predominantly single vessel  . Cardiomyopathy, ischemic   . Cataracts, bilateral to be removed in Aug 2015  . Chronic kidney disease 1976   S/P nephrectomy  . Constipation    takes Colace daily as needed  . Depression   . Dilated cardiomyopathy ([place])    s/p defibrillator [employer] [phone]  . Dizziness    occasionally  . GERD (gastroesophageal reflux disease)    takes OTC med if needed  . Gout    takes Allopurinol daily  . Heart failure ([place])    NYHFA     CLASS 3  . History of hyperthyroidism   . Hyperlipidemia    takes Fenofibrate daily  . Hypertension    takes Bisoprolol daily as well as Imdur  . Hypothyroidism    takes Synthroid daily  . Joint pain   . Joint swelling   . Left knee DJD    needs surgery  . Myocardial infarction ([place]) 2007  . OSA (obstructive sleep apnea)    "don't wear  mask; can't afford one" (12/20/2013)  . Pacemaker   . Peripheral edema    takes Furosemide daily  . Pneumonia, community acquired last time 2014   "get it ~ q yr; haven't had it yet in 2015" (12/20/2013)  . Seasonal allergies    takes Claritin daily  . Shortness of breath    with exertion  . Solitary kidney 06/05/2006  . Urinary frequency   . Urinary incontinence   . Urinary urgency   . Ventricular tachycardia [facility])    s/p defibrillator-[employer]onic [employer] [id number]    Patient Active Problem List   Diagnosis Date Noted  . Urinary incontinence   . Primary osteoarthritis of both knees   . History of ventricular tachycardia   . Hallucinations   . B12 deficiency 07/27/2018  . Psychosis ([place])   . AMS (altered mental status) 07/26/2018  . Chronic gout of left knee   . Unsteady gait   . Primary osteoarthritis of left knee   . HTN (hypertension), benign   . Weakness 07/12/2018  . Delirium 05/30/2018  . On amiodarone therapy 05/18/2018  . Chronic anticoagulation- Coumadin 12/23/2013  . Sinus node dysfunction ([place]) 12/21/2013  . Paroxysmal ventricular tachycardia ([place]) 12/21/2013  . ICD (implantable cardioverter-defibrillator)  lead failure 12/20/2013  . Obesity hypoventilation syndrome ([place]) 12/07/2012  . History of hyperthyroidism   . CKD (chronic kidney disease) stage 3, GFR 30-59 ml/min 06/23/2011  . Morbid obesity   . Long term current use of anticoagulant therapy   . OSA (obstructive sleep apnea)   . Hypertensive heart disease with heart failure ([place])   . Solitary kidney   . Community acquired pneumonia 06/06/2011  . Acute encephalopathy 06/06/2011  . Hypothyroidism   . Mild persistent asthma   . PAF (paroxysmal atrial fibrillation) ([place])   . ICD (implantable cardioverter-defibrillator) in place   . History of dilated cardiomyopathy- last EF 50-55% 2D July 2014   . Chronic combined systolic and diastolic heart failure ([place])   . CAD nonobstructive- 2007 11/12/2005    Past  Surgical History:  Procedure Laterality Date  . CARDIAC CATHETERIZATION  2007   60% Stenosis mid-CFX  . CARDIAC DEFIBRILLATOR PLACEMENT  2007   Medtronic En[employer] [phone]  . CARDIAC DEFIBRILLATOR REMOVAL  12/20/2013  . CARDIOVERSION  06/18/2011   Procedure: CARDIOVERSION;  Surgeon: Kerry Hough., MD;  Location: [facility];  Service: Cardiovascular;  Laterality: N/A;  . CARDIOVERSION N/A 12/06/2012   Procedure: CARDIOVERSION;  Surgeon: Jacolyn Reedy, MD;  Location: [facility];  Service: Cardiovascular;  Laterality: N/A;  . COLONOSCOPY    . ICD GENERATOR CHANGE  07/2012  . ICD LEAD REMOVAL Left 12/20/2013   Procedure: ICD LEAD REMOVAL//EXTRACTION AND PLACEMENT OF TEMP/PERM ATRIAL LEAD;  Surgeon: Evans Lance, MD;  Location: [facility];  Service: Cardiovascular;  Laterality: Left;  . IMPLANTABLE CARDIOVERTER DEFIBRILLATOR IMPLANT N/A 01/25/2014   Procedure: IMPLANTABLE CARDIOVERTER DEFIBRILLATOR IMPLANT;  Surgeon: Evans Lance, MD;  Location: [facility] CATH LAB;  Service: Cardiovascular;  Laterality: N/A;  . IMPLANTABLE CARDIOVERTER DEFIBRILLATOR REVISION N/A 07/13/2012   Procedure: IMPLANTABLE CARDIOVERTER DEFIBRILLATOR REVISION;  Surgeon: Deboraha Sprang, MD;  Location: [facility] CATH LAB;  Service: Cardiovascular;  Laterality: N/A;  . INSERT / REPLACE / REMOVE PACEMAKER  12/20/2013  . KNEE ARTHROSCOPY Left   . MULTIPLE TOOTH EXTRACTIONS     "top's are all gone; took some off the bottom too"  . NEPHRECTOMY  1976   Right  . TEE WITHOUT CARDIOVERSION  07/13/2012   Procedure: TRANSESOPHAGEAL ECHOCARDIOGRAM (TEE);  Surgeon: Lelon Perla, MD;  Location: [facility] ENDOSCOPY;  Service: Cardiovascular;  Laterality: N/A;        Home Medications    Prior to Admission medications   Medication Sig Start Date End Date Taking? Authorizing Provider  acetaminophen (TYLENOL) 500 MG tablet Take 500 mg by mouth every 6 (six) hours as needed for moderate pain.     [provider]  albuterol (PROVENTIL HFA;VENTOLIN HFA)  108 (90 Base) MCG/ACT inhaler Inhale 2 puffs into the lungs every 6 (six) hours as needed for wheezing or shortness of breath. 08/23/18   Alphonzo Grieve, MD  allopurinol (ZYLOPRIM) 100 MG tablet Take 100 mg by mouth daily.  10/28/13   [provider]  amiodarone (PACERONE) 200 MG tablet TAKE 1 TABLET BY MOUTH EVERY DAY 09/06/18   Evans Lance, MD  budesonide-formoterol [facility]) 160-4.5 MCG/ACT inhaler Inhale 2 puffs into the lungs 2 (two) times daily.    [provider]  diclofenac sodium (VOLTAREN) 1 % GEL Apply 2 g topically 4 (four) times daily. Patient taking differently: Apply 2 g topically 2 (two) times daily. For pain apply to both knees 07/15/18   [name], MD  ELIQUIS 5 MG  TABS tablet TAKE 1 TABLET BY MOUTH TWICE A DAY 01/16/19   Richardo Priest, MD  fenofibrate 160 MG tablet Take 160 mg by mouth daily.    [provider]  fluticasone (FLONASE) 50 MCG/ACT nasal spray Place 2 sprays into both nostrils daily. 08/24/18   Alphonzo Grieve, MD  furosemide (LASIX) 80 MG tablet TAKE 1 TABLET BY MOUTH EVERY DAY 01/25/19   Richardo Priest, MD  KLOR-CON M20 20 MEQ tablet Take 20 mEq by mouth daily. 12/04/18   [provider]  pantoprazole (PROTONIX) 40 MG tablet Take 1 tablet (40 mg total) by mouth daily. 08/24/18   Alphonzo Grieve, MD  polyethylene glycol (MIRALAX / GLYCOLAX) packet Take 17 g by mouth daily. 08/24/18   Alphonzo Grieve, MD  risperiDONE (RISPERDAL) 1 MG tablet TAKE 1/2 (ONE HALF) TABLET BY MOUTH EVERY MORNING AND 1 TAB AT BEDTIME 11/03/18   [provider]  SYNTHROID 150 MCG tablet Take 150 mcg by mouth daily.  01/29/18   [provider]  tamsulosin (FLOMAX) 0.4 MG CAPS capsule Take 1 capsule (0.4 mg total) by mouth daily. 08/24/18   Alphonzo Grieve, MD  vitamin B-12 1000 MCG tablet Take 1 tablet (1,000 mcg total) by mouth daily. 08/24/18   Alphonzo Grieve, MD    Family History Family History  Problem Relation Age of Onset  .  Heart disease Other        uncle  . Diabetes Other        uncle    Social History Social History   Tobacco Use  . Smoking status: Never Smoker  . Smokeless tobacco: Never Used  Substance Use Topics  . Alcohol use: Yes    Comment: 12/20/2013 "might have drank a couple beers before; never really drank"  . Drug use: No     Allergies   Methimazole [methimazole]   Review of Systems Review of Systems  Constitutional: Negative for chills and fever.  HENT: Negative for ear pain and sore throat.   Eyes: Negative for pain and visual disturbance.  Respiratory: Negative for cough and shortness of breath.   Cardiovascular: Negative for chest pain and palpitations.  Gastrointestinal: Negative for abdominal pain and vomiting.  Genitourinary: Negative for dysuria and hematuria.  Musculoskeletal: Negative for arthralgias and back pain.  Skin: Negative for color change and rash.  Neurological: Negative for seizures and syncope.  All other systems reviewed and are negative.    Physical Exam Updated Vital Signs BP (!) 152/58   Pulse (!) 56   Temp 98.5 F (36.9 C) (Rectal)   Resp 14   Ht 5\' 6"  (1.676 m)   Wt 86.2 kg   SpO2 98%   BMI 30.67 kg/m   Physical Exam Vitals signs and nursing note reviewed.  Constitutional:      Appearance: He is well-developed.  HENT:     Head: Normocephalic.     Comments: Superficial abrasion noted over left cheek, no focal bony tenderness, no laceration    Nose:     Comments: No nasal septal hematoma    Mouth/Throat:     Mouth: Mucous membranes are dry.  Eyes:     Conjunctiva/sclera: Conjunctivae normal.  Neck:     Musculoskeletal: Neck supple.  Cardiovascular:     Rate and Rhythm: Normal rate and regular rhythm.     Heart sounds: No murmur.  Pulmonary:     Effort: Pulmonary effort is normal. No respiratory distress.     Breath sounds: Normal breath sounds.  Abdominal:     Palpations: Abdomen is soft.     Tenderness: There is no  abdominal tenderness.  Musculoskeletal:        General: No swelling or tenderness.     Comments: No midline C, T, L-spine tenderness  No tenderness palpation throughout all 4 extremities, normal joint range of motion, distal pulses intact in all 4 extremities  Skin:    General: Skin is warm and dry.     Capillary Refill: Capillary refill takes less than 2 seconds.  Neurological:     General: No focal deficit present.     Mental Status: He is alert and oriented to person, place, and time.  Psychiatric:        Mood and Affect: Mood normal.        Behavior: Behavior normal.      ED Treatments / Results  Labs (all labs ordered are listed, but only abnormal results are displayed) Labs Reviewed  CBC WITH DIFFERENTIAL/PLATELET - Abnormal; Notable for the following components:      Result Value   Hemoglobin 12.3 (*)    HCT 38.2 (*)    Neutro Abs 9.2 (*)    Lymphs Abs 0.4 (*)    All other components within normal limits  CK  COMPREHENSIVE METABOLIC PANEL  LACTIC ACID, PLASMA  LACTIC ACID, PLASMA  URINALYSIS, ROUTINE W REFLEX MICROSCOPIC  BRAIN NATRIURETIC PEPTIDE  TROPONIN I (HIGH SENSITIVITY)    EKG None  Radiology No results found.  Procedures Procedures (including critical care time)  Medications Ordered in ED Medications - No data to display   Initial Impression / Assessment and Plan / ED Course  I have reviewed the triage vital signs and the nursing notes.  Pertinent labs & imaging results that were available during my care of the patient were reviewed by me and considered in my medical decision making (see chart for details).  Clinical Course as of Apr 23 1503  Sun Apr 23, 2019  [id number] Completed initial assessment, will check labs, CT head and reassess   [RD]  1016 Received call from medtronic rep, no events on device   [RD]  1108 Screening pelvis xray with concern for possible  DG Chest Portable 1 View [RD]  1322 Attempted to call relative on file for  patient, no answer   [RD]  1322 Updated patient, will    [RD]    Clinical Course User Index [RD] Lucrezia Starch, MD       73 year old male presents to ER after family found him down on the ground.  Patient unsure of how it happened.  He has A. fib, heart failure, on anticoagulation.  Noted superficial abrasion over his face but no other acute traumatic process identified.  CT scan of head, C-spine and pelvis and chest x-rays were ordered.  CT negative for trauma.  Questionable findings for possible stroke.  Patient without focal deficit on exam.  Initially had ordered MRI however patient does not have MRI capable pacemaker so this test is unable to be performed.  I clinically have a low suspicion for acute stroke.  Chest x-ray is negative for trauma but did demonstrate pneumonia.  Pelvis x-ray had concern for possible mass/abscess in the right lower abdomen/pelvis.  Radiologist called to inform me of this likely incidental finding.  CT scan was ordered per his recommendation to further evaluate, no acute abnormalities were identified in the area of concern.  Again demonstrated the right lower lobe pneumonia.  Will start antibiotics  for patient for possible aspiration pneumonia.  Patient significant generalized weakness, unwilling to attempt any ambulation.  Believe patient would benefit from inpatient mission for further management.  Reviewed with Dr. Tamala Julian with the hospital service who agrees to admit patient.  Final Clinical Impressions(s) / ED Diagnoses   Final diagnoses:  Aspiration pneumonia of right lower lobe, unspecified aspiration pneumonia type [facility])    ED Discharge Orders    None       Lucrezia Starch, MD 04/23/19 1506

## 2019-04-23 NOTE — ED Notes (Signed)
(  [place]) Cody Faulkner [phone]

## 2019-04-23 NOTE — ED Notes (Signed)
This RN made aware pt defibrillator is not MRI safe. EDP notified.

## 2019-04-23 NOTE — ED Notes (Addendum)
ED TO INPATIENT HANDOFF REPORT  ED Nurse Name and Phone #:   S Name/Age/Gender Cody Faulkner 73 y.o. male Room/Bed: 017C/017C  Code Status   Code Status: Full Code  Home/SNF/Other Home Patient oriented to: self, place, time and situation Is this baseline? Yes   Triage Complete: Triage complete  Chief Complaint Fall; altered  Triage Note Pt BIB [facility] from home. Pt fell at some point last night. Family member reports that he left the house last night @ 1900. Upon EMS arrival RA Sp02 85%. 100% NRB. CBG 140. VSS. Pt with history of CHF.   Allergies Allergies  Allergen Reactions  . Methimazole [Methimazole] Itching and Rash    Severe rash    Level of Care/Admitting Diagnosis ED Disposition    ED Disposition Condition [facility] Area: [facility] [[id number]]  Level of Care: Telemetry Medical [104]  Covid Evaluation: Asymptomatic Screening Protocol (No Symptoms)  Diagnosis: Aspiration pneumonia [facility]) [id number]  Admitting Physician: Norval Morton [id number]  Attending Physician: Norval Morton [id number]  Estimated length of stay: past midnight tomorrow  Certification:: I certify this patient will need inpatient services for at least 2 midnights  PT Class (Do Not Modify): Inpatient [101]  PT Acc Code (Do Not Modify): Private [1]       B Medical/Surgery History Past Medical History:  Diagnosis Date  . Asthma    Albuterol prn;Symbicort daily  . Atrial fibrillation ([place])    takes COumadin daily  . Bruises easily    d/t being on Coumadin  . CAD (coronary artery disease)    predominantly single vessel  . Cardiomyopathy, ischemic   . Cataracts, bilateral to be removed in Aug 2015  . Chronic kidney disease 1976   S/P nephrectomy  . Constipation    takes Colace daily as needed  . Depression   . Dilated cardiomyopathy ([place])    s/p defibrillator [employer] [phone]  . Dizziness    occasionally  . GERD (gastroesophageal  reflux disease)    takes OTC med if needed  . Gout    takes Allopurinol daily  . Heart failure ([place])    NYHFA     CLASS 3  . History of hyperthyroidism   . Hyperlipidemia    takes Fenofibrate daily  . Hypertension    takes Bisoprolol daily as well as Imdur  . Hypothyroidism    takes Synthroid daily  . Joint pain   . Joint swelling   . Left knee DJD    needs surgery  . Myocardial infarction ([place]) 2007  . OSA (obstructive sleep apnea)    "don't wear mask; can't afford one" (12/20/2013)  . Pacemaker   . Peripheral edema    takes Furosemide daily  . Pneumonia, community acquired last time 2014   "get it ~ q yr; haven't had it yet in 2015" (12/20/2013)  . Seasonal allergies    takes Claritin daily  . Shortness of breath    with exertion  . Solitary kidney 06/05/2006  . Urinary frequency   . Urinary incontinence   . Urinary urgency   . Ventricular tachycardia [facility])    s/p defibrillator-Medtronic EnTrust [id number]   Past Surgical History:  Procedure Laterality Date  . CARDIAC CATHETERIZATION  2007   60% Stenosis mid-CFX  . CARDIAC DEFIBRILLATOR PLACEMENT  2007   Medtronic EnTrust [phone]  . CARDIAC DEFIBRILLATOR REMOVAL  12/20/2013  . CARDIOVERSION  06/18/2011   Procedure: CARDIOVERSION;  Surgeon: Serita Grit  Loyce Dys., MD;  Location: [place];  Service: Cardiovascular;  Laterality: N/A;  . CARDIOVERSION N/A 12/06/2012   Procedure: CARDIOVERSION;  Surgeon: Jacolyn Reedy, MD;  Location: [place];  Service: Cardiovascular;  Laterality: N/A;  . COLONOSCOPY    . ICD GENERATOR CHANGE  07/2012  . ICD LEAD REMOVAL Left 12/20/2013   Procedure: ICD LEAD REMOVAL//EXTRACTION AND PLACEMENT OF TEMP/PERM ATRIAL LEAD;  Surgeon: Evans Lance, MD;  Location: [place];  Service: Cardiovascular;  Laterality: Left;  . IMPLANTABLE CARDIOVERTER DEFIBRILLATOR IMPLANT N/A 01/25/2014   Procedure: IMPLANTABLE CARDIOVERTER DEFIBRILLATOR IMPLANT;  Surgeon: Evans Lance, MD;  Location: [facility] CATH LAB;  Service:  Cardiovascular;  Laterality: N/A;  . IMPLANTABLE CARDIOVERTER DEFIBRILLATOR REVISION N/A 07/13/2012   Procedure: IMPLANTABLE CARDIOVERTER DEFIBRILLATOR REVISION;  Surgeon: Deboraha Sprang, MD;  Location: [facility] CATH LAB;  Service: Cardiovascular;  Laterality: N/A;  . INSERT / REPLACE / REMOVE PACEMAKER  12/20/2013  . KNEE ARTHROSCOPY Left   . MULTIPLE TOOTH EXTRACTIONS     "top's are all gone; took some off the bottom too"  . NEPHRECTOMY  1976   Right  . TEE WITHOUT CARDIOVERSION  07/13/2012   Procedure: TRANSESOPHAGEAL ECHOCARDIOGRAM (TEE);  Surgeon: Lelon Perla, MD;  Location: [facility] ENDOSCOPY;  Service: Cardiovascular;  Laterality: N/A;     A IV Location/Drains/Wounds Patient Lines/Drains/Airways Status   Active Line/Drains/Airways    Name:   Placement date:   Placement time:   Site:   Days:   Peripheral IV 04/23/19 Right Antecubital   04/23/19    0905    Antecubital   less than 1          Intake/Output Last 24 hours  Intake/Output Summary (Last 24 hours) at 04/23/2019 1755 Last data filed at 04/23/2019 1624 Gross per 24 hour  Intake 100 ml  Output -  Net 100 ml    Labs/Imaging Results for orders placed or performed during the hospital encounter of 04/23/19 (from the past 48 hour(s))  CK     Status: None   Collection Time: 04/23/19  9:15 AM  Result Value Ref Range   Total CK 237 49 - 397 U/L    Comment: Performed at [facility], [address] [place], [place] [address]  Troponin I (High Sensitivity)     Status: None   Collection Time: 04/23/19  9:15 AM  Result Value Ref Range   Troponin I (High Sensitivity) 13 <18 ng/L    Comment: (NOTE) Elevated high sensitivity troponin I (hsTnI) values and significant  changes across serial measurements may suggest ACS but many other  chronic and acute conditions are known to elevate hsTnI results.  Refer to the "Links" section for chest pain algorithms and additional  guidance. Performed at [facility], [place]  [address], [place], [place] [address]   CBC with Differential     Status: Abnormal   Collection Time: 04/23/19  9:15 AM  Result Value Ref Range   WBC 10.5 4.0 - 10.5 K/uL   RBC 4.24 4.22 - 5.81 MIL/uL   Hemoglobin 12.3 (L) 13.0 - 17.0 g/dL   HCT 38.2 (L) 39.0 - 52.0 %   MCV 90.1 80.0 - 100.0 fL   MCH 29.0 26.0 - 34.0 pg   MCHC 32.2 30.0 - 36.0 g/dL   RDW 13.2 11.5 - 15.5 %   Platelets 232 150 - 400 K/uL   nRBC 0.0 0.0 - 0.2 %   Neutrophils Relative % 88 %  Neutro Abs 9.2 (H) 1.7 - 7.7 K/uL   Lymphocytes Relative 3 %   Lymphs Abs 0.4 (L) 0.7 - 4.0 K/uL   Monocytes Relative 8 %   Monocytes Absolute 0.9 0.1 - 1.0 K/uL   Eosinophils Relative 0 %   Eosinophils Absolute 0.0 0.0 - 0.5 K/uL   Basophils Relative 0 %   Basophils Absolute 0.0 0.0 - 0.1 K/uL   Immature Granulocytes 1 %   Abs Immature Granulocytes 0.07 0.00 - 0.07 K/uL    Comment: Performed at [facility] [address] [place], [place] [address]  Comprehensive metabolic panel     Status: Abnormal   Collection Time: 04/23/19  9:15 AM  Result Value Ref Range   Sodium 141 135 - 145 mmol/L   Potassium 3.7 3.5 - 5.1 mmol/L   Chloride 106 98 - 111 mmol/L   CO2 21 (L) 22 - 32 mmol/L   Glucose, Bld 108 (H) 70 - 99 mg/dL   BUN 35 (H) 8 - 23 mg/dL   Creatinine, Ser 1.53 (H) 0.61 - 1.24 mg/dL   Calcium 9.1 8.9 - 10.3 mg/dL   Total Protein 6.8 6.5 - 8.1 g/dL   Albumin 3.2 (L) 3.5 - 5.0 g/dL   AST 22 15 - 41 U/L   ALT 20 0 - 44 U/L   Alkaline Phosphatase 42 38 - 126 U/L   Total Bilirubin 1.9 (H) 0.3 - 1.2 mg/dL   GFR calc non Af Amer 44 (L) >60 mL/min   GFR calc Af Amer 51 (L) >60 mL/min   Anion gap 14 5 - 15    Comment: Performed at [facility], [place] [address], [place], [place] [address]  Lactic acid, plasma     Status: None   Collection Time: 04/23/19  9:15 AM  Result Value Ref Range   Lactic Acid, Venous 0.9 0.5 - 1.9 mmol/L    Comment: Performed at [facility] [address], [place], [place]  [address]  Brain natriuretic peptide     Status: None   Collection Time: 04/23/19  9:15 AM  Result Value Ref Range   B Natriuretic Peptide 76.5 0.0 - 100.0 pg/mL    Comment: Performed at [facility] [address] [place], [place] [address]  Troponin I (High Sensitivity)     Status: None   Collection Time: 04/23/19 11:44 AM  Result Value Ref Range   Troponin I (High Sensitivity) 16 <18 ng/L    Comment: (NOTE) Elevated high sensitivity troponin I (hsTnI) values and significant  changes across serial measurements may suggest ACS but many other  chronic and acute conditions are known to elevate hsTnI results.  Refer to the "Links" section for chest pain algorithms and additional  guidance. Performed at [facility], [place] [address], [place], [place] [address]   Lactic acid, plasma     Status: None   Collection Time: 04/23/19  5:10 PM  Result Value Ref Range   Lactic Acid, Venous 0.7 0.5 - 1.9 mmol/L    Comment: Performed at [facility] [address] [place], [place] [address]   Ct Head Wo Contrast  Result Date: 04/23/2019 CLINICAL DATA:  Neck pain and left facial bruising following a fall last night. EXAM: CT HEAD WITHOUT CONTRAST CT CERVICAL SPINE WITHOUT CONTRAST TECHNIQUE: Multidetector CT imaging of the head and cervical spine was performed following the standard protocol without intravenous contrast. Multiplanar CT image reconstructions of the  cervical spine were also generated. COMPARISON:  07/08/2018. FINDINGS: CT HEAD FINDINGS Brain: Interval low density in the right cerebellum and ill-defined low density in the right temporal lobe. It is difficult to determine much of this is due to photon starvation at the skull base with the patient's head tilted Stable mildly enlarged ventricles and cortical sulci. No intracranial hemorrhage or mass lesion. Vascular: No hyperdense vessel or unexpected calcification. Skull: No skull fracture. Sinuses/Orbits: Mild mucosal  thickening involving all of the paranasal sinuses. Status post bilateral cataract extraction. Other: None. CT CERVICAL SPINE FINDINGS Alignment: Stable grade 1 retrolisthesis at the C5-6 level. Mild dextroconvex cervical scoliosis due to the patient's head tilted to the left, unchanged. Skull base and vertebrae: No acute fracture. No primary bone lesion or focal pathologic process. Soft tissues and spinal canal: No prevertebral fluid or swelling. No visible canal hematoma. Disc levels:  Multilevel degenerative changes. Upper chest: The included portions of the upper lobes are clear. Other: Bilateral carotid artery atheromatous calcifications. IMPRESSION: 1. 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 This could all be artifactual due to photon starvation related to the patient's head tilted in the gantry. However, acute infarction cannot be excluded in either area. If this is a clinical concern, recommend further evaluation with MRI of the brain. 2. No skull fracture or intracranial hemorrhage. 3. No cervical spine fracture or traumatic subluxation. 4. Stable mild diffuse cerebral and cerebellar atrophy. 5. Multilevel cervical spine degenerative changes. 6. Mild chronic pansinusitis. 7. Bilateral carotid artery atheromatous calcifications. Electronically Signed   By: Claudie Revering M.D.   On: 04/23/2019 10:34   Ct Cervical Spine Wo Contrast  Result Date: 04/23/2019 CLINICAL DATA:  Neck pain and left facial bruising following a fall last night. EXAM: CT HEAD WITHOUT CONTRAST CT CERVICAL SPINE WITHOUT CONTRAST TECHNIQUE: Multidetector CT imaging of the head and cervical spine was performed following the standard protocol without intravenous contrast. Multiplanar CT image reconstructions of the cervical spine were also generated. COMPARISON:  07/08/2018. FINDINGS: CT HEAD FINDINGS Brain: Interval low density in the right cerebellum and ill-defined low density  in the right temporal lobe. It is difficult to determine much of this is due to photon starvation at the skull base with the patient's head tilted Stable mildly enlarged ventricles and cortical sulci. No intracranial hemorrhage or mass lesion. Vascular: No hyperdense vessel or unexpected calcification. Skull: No skull fracture. Sinuses/Orbits: Mild mucosal thickening involving all of the paranasal sinuses. Status post bilateral cataract extraction. Other: None. CT CERVICAL SPINE FINDINGS Alignment: Stable grade 1 retrolisthesis at the C5-6 level. Mild dextroconvex cervical scoliosis due to the patient's head tilted to the left, unchanged. Skull base and vertebrae: No acute fracture. No primary bone lesion or focal pathologic process. Soft tissues and spinal canal: No prevertebral fluid or swelling. No visible canal hematoma. Disc levels:  Multilevel degenerative changes. Upper chest: The included portions of the upper lobes are clear. Other: Bilateral carotid artery atheromatous calcifications. IMPRESSION: 1. 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 This could all be artifactual due to photon starvation related to the patient's head tilted in the gantry. However, acute infarction cannot be excluded in either area. If this is a clinical concern, recommend further evaluation with MRI of the brain. 2. No skull fracture or intracranial hemorrhage. 3. No cervical spine fracture or traumatic subluxation. 4. Stable mild diffuse cerebral and cerebellar atrophy. 5. Multilevel cervical spine  degenerative changes. 6. Mild chronic pansinusitis. 7. Bilateral carotid artery atheromatous calcifications. Electronically Signed   By: Claudie Revering M.D.   On: 04/23/2019 10:34   Ct Abdomen Pelvis W Contrast  Result Date: 04/23/2019 CLINICAL DATA:  Gas and fluid collection inferior to the right inferior pubic ramus on a portable pelvis radiograph earlier today. The patient  fell. EXAM: CT ABDOMEN AND PELVIS WITH CONTRAST TECHNIQUE: Multidetector CT imaging of the abdomen and pelvis was performed using the standard protocol following bolus administration of intravenous contrast. CONTRAST:  130mL OMNIPAQUE IOHEXOL 300 MG/ML  SOLN COMPARISON:  Pelvis radiograph and portable chest radiograph obtained earlier today. Abdomen and pelvis CT dated 03/04/2009. FINDINGS: Lower chest: Dense consolidation in the right lower lobe with air bronchograms. Small right pleural effusion. Enlarged heart. Intracardiac pacer and AICD leads. Hepatobiliary: No focal liver abnormality is seen. No gallstones, gallbladder wall thickening, or biliary dilatation. Pancreas: Unremarkable. No pancreatic ductal dilatation or surrounding inflammatory changes. Spleen: Normal in size without focal abnormality. Adrenals/Urinary Tract: Stable post nephrectomy changes on the right. Normal appearing adrenal glands and left kidney. Interval tortuosity and mild focal dilatation of the distal left ureter, proximal to the ureterovesical junction. The distal ureter is normal in caliber. Mild diffuse bladder wall thickening with 2 small interval diverticula posteriorly, 1 on the left and 1 on the right. Stomach/Bowel: Unremarkable stomach, small bowel and colon. No evidence of appendicitis. Vascular/Lymphatic: Atheromatous arterial calcifications without aneurysm. No enlarged lymph nodes. Reproductive: Normal sized prostate gland containing small, coarse calcifications. Large bilateral hydroceles. Other: Small left inguinal hernia containing fat. No gas seen inferior to the right pubic ramus or in the proximal right thigh. Musculoskeletal: Lumbar and lower thoracic spine degenerative changes. IMPRESSION: 1. Dense consolidation in the right lower lobe with air bronchograms, compatible with pneumonia or dense atelectasis. 2. Small right pleural effusion. 3. No gas seen inferior to the right inferior pubic ramus. The appearance seen  previously was due to air trapped beneath the soft tissues of the buttock on the right. 4. Stable post nephrectomy changes on the right. 5. Mild diffuse bladder wall thickening with 2 small interval diverticula posteriorly. 6. Large bilateral hydroceles. 7. Small left inguinal hernia containing fat. Electronically Signed   By: Claudie Revering M.D.   On: 04/23/2019 13:41   Dg Pelvis Portable  Addendum Date: 04/23/2019   ADDENDUM REPORT: 04/23/2019 11:13 ADDENDUM: These results were called by telephone at the time of interpretation on 04/23/2019 at 11:13 am to provider [facility] , who verbally acknowledged these results. Electronically Signed   By: Zetta Bills M.D.   On: 04/23/2019 11:13   Result Date: 04/23/2019 CLINICAL DATA:  Fall. EXAM: PORTABLE PELVIS 1-2 VIEWS COMPARISON:  Abdomen pelvis CT from 2010. FINDINGS: Bubbly lucency just inferior to the right inferior pubic ramus measuring 7.4 x 3.5 cm. No acute bone finding. Hips are located with mild to moderate degenerative changes. IMPRESSION: Gas and fluid collection inferior to the right inferior pubic ramus may represent a soft tissue infection involving pelvic floor or upper thigh, transperineal hernia containing bowel could also have this appearance. Consider CT for further assessment. Electronically Signed: By: Zetta Bills M.D. On: 04/23/2019 10:58   Dg Chest Portable 1 View  Result Date: 04/23/2019 CLINICAL DATA:  Fall. EXAM: PORTABLE CHEST 1 VIEW COMPARISON:  Chest x-ray 07/26/2018 FINDINGS: Hazy opacity at the right lung base with subtle increased interstitial markings and increased retrocardiac opacification as compared to previous study. Similar appearance at the left lung  base. Upper chest is clear bilaterally. Cardiomediastinal contours stable with cardiac enlargement. Right-sided dual lead pacer device remains in place. No acute bone finding. IMPRESSION: 1. Interstitial airspace opacities at the lung bases suspicious for  developing developing pneumonia or pneumonitis from aspiration. Electronically Signed   By: Zetta Bills M.D.   On: 04/23/2019 10:54    Pending Labs Unresulted Labs (From admission, onward)    Start     Ordered   04/24/19 0500  CBC  Tomorrow morning,   R     04/23/19 1536   04/24/19 [id number]  Basic metabolic panel  Tomorrow morning,   R     04/23/19 1536   04/24/19 0500  Procalcitonin  Daily,   R     04/23/19 1536   04/23/19 1748  SARS CORONAVIRUS 2 (TAT 6-24 HRS) Nasopharyngeal Nasopharyngeal Swab  (Asymptomatic/Tier 2 Patients Labs)  Once,   STAT    Question Answer Comment  Is this test for diagnosis or screening Screening   Symptomatic for COVID-19 as defined by CDC No   Hospitalized for COVID-19 No   Admitted to ICU for COVID-19 No   Previously tested for COVID-19 No   Resident in a congregate (group) care setting No   Employed in healthcare setting No      04/23/19 1747   04/23/19 1539  Legionella Pneumophila Serogp 1 Ur Ag  Once,   STAT     04/23/19 1538   04/23/19 1539  Strep pneumoniae urinary antigen  Once,   STAT     04/23/19 1538   04/23/19 1539  Culture, sputum-assessment  Once,   R     04/23/19 1538   04/23/19 1537  TSH  Add-on,   AD     04/23/19 1536   04/23/19 1537  Procalcitonin - Baseline  ONCE - STAT,   STAT     04/23/19 1536   04/23/19 0916  Urinalysis, Routine w reflex microscopic  Once,   STAT     04/23/19 0915          Vitals/Pain Today's Vitals   04/23/19 1600 04/23/19 1615 04/23/19 1630 04/23/19 1645  BP: (!) 109/51 118/68 (!) 112/54 (!) 144/52  Pulse: (!) 46 (!) 52 (!) 46 (!) 53  Resp: 19 14 17 16   Temp:      TempSrc:      SpO2: 95% 95% 96% 94%  Weight:      Height:      PainSc:        Isolation Precautions No active isolations  Medications Medications  Ampicillin-Sulbactam (UNASYN) 3 g in sodium chloride 0.9 % 100 mL IVPB (0 g Intravenous Stopped 04/23/19 1624)  amiodarone (PACERONE) tablet 200 mg (has no administration in time  range)  fenofibrate tablet 160 mg (has no administration in time range)  risperiDONE (RISPERDAL) tablet 0.5-1 mg (has no administration in time range)  levothyroxine (SYNTHROID) tablet 150 mcg (has no administration in time range)  pantoprazole (PROTONIX) EC tablet 40 mg (has no administration in time range)  tamsulosin (FLOMAX) capsule 0.4 mg (has no administration in time range)  apixaban (ELIQUIS) tablet 5 mg (has no administration in time range)  potassium chloride SA (KLOR-CON) CR tablet 20 mEq (has no administration in time range)  diclofenac sodium (VOLTAREN) 1 % transdermal gel 2 g (has no administration in time range)  sodium chloride flush (NS) 0.9 % injection 3 mL (has no administration in time range)  acetaminophen (TYLENOL) tablet 650 mg (has no administration in time  range)    Or  acetaminophen (TYLENOL) suppository 650 mg (has no administration in time range)  ondansetron (ZOFRAN) tablet 4 mg (has no administration in time range)    Or  ondansetron (ZOFRAN) injection 4 mg (has no administration in time range)  albuterol (PROVENTIL) (2.5 MG/3ML) 0.083% nebulizer solution 2.5 mg (has no administration in time range)  guaiFENesin (MUCINEX) 12 hr tablet 600 mg (has no administration in time range)  0.9 %  sodium chloride infusion (has no administration in time range)  iohexol (OMNIPAQUE) 300 MG/ML solution 100 mL (100 mLs Intravenous Contrast Given 04/23/19 1249)    Mobility  High fall risk   Focused Assessments    R Recommendations: See Admitting Provider Note  Report given to: 6E RN  Additional Notes:

## 2019-04-23 NOTE — ED Triage Notes (Signed)
Pt BIB [facility] from home. Pt fell at some point last night. Family member reports that he left the house last night @ 1900. Upon EMS arrival RA Sp02 85%. 100% NRB. CBG 140. VSS. Pt with history of CHF.

## 2019-04-24 ENCOUNTER — Inpatient Hospital Stay (HOSPITAL_COMMUNITY): Payer: Medicare Other

## 2019-04-24 LAB — BASIC METABOLIC PANEL
Anion gap: 11 (ref 5–15)
BUN: 31 mg/dL — ABNORMAL HIGH (ref 8–23)
CO2: 23 mmol/L (ref 22–32)
Calcium: 8.6 mg/dL — ABNORMAL LOW (ref 8.9–10.3)
Chloride: 108 mmol/L (ref 98–111)
Creatinine, Ser: 1.57 mg/dL — ABNORMAL HIGH (ref 0.61–1.24)
GFR calc Af Amer: 49 mL/min — ABNORMAL LOW (ref >60)
GFR calc non Af Amer: 42 mL/min — ABNORMAL LOW (ref >60)
Glucose, Bld: 105 mg/dL — ABNORMAL HIGH (ref 70–99)
Potassium: 3.5 mmol/L (ref 3.5–5.1)
Sodium: 142 mmol/L (ref 135–145)

## 2019-04-24 LAB — CBC
HCT: 32.8 % — ABNORMAL LOW (ref 39.0–52.0)
Hemoglobin: 10.6 g/dL — ABNORMAL LOW (ref 13.0–17.0)
MCH: 28.6 pg (ref 26.0–34.0)
MCHC: 32.3 g/dL (ref 30.0–36.0)
MCV: 88.6 fL (ref 80.0–100.0)
Platelets: 236 10*3/uL (ref 150–400)
RBC: 3.7 MIL/uL — ABNORMAL LOW (ref 4.22–5.81)
RDW: 13.2 % (ref 11.5–15.5)
WBC: 7.1 10*3/uL (ref 4.0–10.5)
nRBC: 0 % (ref 0.0–0.2)

## 2019-04-24 LAB — GLUCOSE, CAPILLARY
Glucose-Capillary: 99 mg/dL (ref 70–99)
Glucose-Capillary: 97 mg/dL (ref 70–99)
Glucose-Capillary: 111 mg/dL — ABNORMAL HIGH (ref 70–99)
Glucose-Capillary: 98 mg/dL (ref 70–99)
Glucose-Capillary: 120 mg/dL — ABNORMAL HIGH (ref 70–99)

## 2019-04-24 LAB — PROCALCITONIN: Procalcitonin: 0.13 ng/mL

## 2019-04-24 MED ORDER — RESOURCE THICKENUP CLEAR PO POWD
ORAL | Status: DC | PRN
  Filled 2019-04-24: qty 125

## 2019-04-24 NOTE — Progress Notes (Signed)
Pt passed [place] and ate meal w/o difficulty but Pt exhibited increased coughing with meds. Will place order for Speech Eval per protocol .Jessie Foot, RN

## 2019-04-24 NOTE — Plan of Care (Signed)
  Problem: Coping: Goal: Will identify appropriate support needs Outcome: Progressing   Problem: Nutrition: Goal: Risk of aspiration will decrease Outcome: Progressing   Problem: Health Behavior/Discharge Planning: Goal: Ability to manage health-related needs will improve Outcome: Progressing

## 2019-04-24 NOTE — Progress Notes (Signed)
OT Cancellation Note  Patient Details Name: TRENDELL LEAHY MRN: [id number] DOB: [birth year]   Cancelled Treatment:    Reason Eval/Treat Not Completed: Medical issues which prohibited therapy.  Pt's HR in the low 40's throughout the day, so per nursing request OT eval will be held until cardiology can make recommendations.  Will check back tomorrow.  Dontavis Tschantz OTR/L 04/24/2019, 3:01 PM

## 2019-04-24 NOTE — Progress Notes (Signed)
PT Cancellation Note  Patient Details Name: LONDYN CODAY MRN: [id number] DOB: 14-Apr-1946   Cancelled Treatment:    Reason Eval/Treat Not Completed: Other (comment) per chart review patient with significant brady-cardia; discussed with RN who will f/u and let PT know medical readiness of patient later in day. Will follow acutely.    Windell Norfolk, DPT, CBIS  Supplemental Physical Therapist [facility]    Pager [phone] Acute Rehab Office [phone]

## 2019-04-24 NOTE — Progress Notes (Signed)
Modified Barium Swallow Progress Note  Patient Details  Name: Cody Faulkner MRN: [id number] Date of Birth: [birth year]  Today's Date: 04/24/2019  Modified Barium Swallow completed.  Full report located under Chart Review in the Imaging Section.  Brief recommendations include the following:  Clinical Impression  Pt has a moderate oropharyngeal dysphagia that is possibly more chronic in nature. He has decreased oral control, resulting in anterior and posterior spillage and decreased bolus cohesion. He has intermittent oral residue. His base of tongue retraction is reduced and he has incomplete deflection of his epiglottis - moving downward but not backward. Silent aspiration occurs during the swallow with thin and nectar thick liquids. He seemed to have reduce aspriation events with a head turn to his left, but this did not completely eliminate aspiration from occurring. Honey thick liquids and solids are better contained in the valleculae and therefore are swallowed without aspiration., although residue in the valleculae increases with increased viscosity. Recommend Dys 2 diet and honey thick liquids by single cup sips. 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 It is unclear how this may have fluctuate over this period of time, but pt also reports having had at least four cases of PNA not including this one, although he is unclear of timing. Given that this may be at least acute on chronic, recommend considering palliative care to address overall GOC moving forward.    Swallow Evaluation Recommendations       SLP Diet Recommendations: Dysphagia 2 (Fine chop) solids;Honey thick liquids   Liquid Administration via: Cup;No straw   Medication Administration: Crushed with puree   Supervision: Patient able to self feed;Full supervision/cueing for  compensatory strategies   Compensations: Minimize environmental distractions;Slow rate;Small sips/bites   Postural Changes: Seated upright at 90 degrees   Oral Care Recommendations: Oral care BID   Other Recommendations: Order thickener from pharmacy;Prohibited food (jello, ice cream, thin soups);Remove water pitcher    Venita Sheffield Willard Madrigal 04/24/2019,1:26 PM   Pollyann Glen, M.A. [place] Acute Environmental education officer [phone] Office [phone]

## 2019-04-24 NOTE — Plan of Care (Signed)
  Problem: Coping: Goal: Will identify appropriate support needs Outcome: Progressing  Per Pt he has no family except a nephew and will need Nursing home placement

## 2019-04-24 NOTE — Progress Notes (Signed)
PT Cancellation Note  Patient Details Name: Cody Faulkner MRN: [id number] DOB: 10/31/1945   Cancelled Treatment:    Reason Eval/Treat Not Completed: Medical issues which prohibited therapy per RN telemonitoring shows HR remaining in the 40s. Plan to hold for now due to medical status/bradycardia. If patient has urgent rehab needs, please direct message this therapist in [place] (8am-4pm) or call acute rehab office at number below.     Windell Norfolk, DPT, CBIS  Supplemental Physical Therapist [facility]    Pager [phone] Acute Rehab Office [phone]

## 2019-04-24 NOTE — Progress Notes (Addendum)
PROGRESS NOTE    Cody Faulkner  [id number]  DOB: 06/26/1945  DOA: 04/23/2019 PCP: Cyndi Bender, PA-C  Brief Narrative:  73 y.o. male with medical history significant of hypertension, paroxysmal atrial fibrillation on Eliquis, CAD, cardiomyopathy, ventricular tachycardia s/p AICD, CKD stage III, s/p right nephrectomy OSA, hypothyroidism, gout, and arthritis.  He presents after being found face down at home--apparently fell the night before and could not wake up.  Patient does not recollect the event. ED Course: Interrogation of the patient's AICD did not reveal any acute arrhythmias.  Chest x-ray was concerning for the possibility of a developing pneumonia or aspiration pneumonitis. Patient was started on antibiotics of Unasyn.  Initial CT scan of the brain was abnormal indicating right cerebellar and temporal lobe infarcts versus artifact.  Patient was unable to have a MRI of the brain because of his AICD.  TRH called to admit.  Subjective:  Patient awake alert oriented x3 today.  Was able to tell me the place/year and his name.  Seen by speech therapy and underwent modified barium swallow this afternoon.  Ate lunch well, noted empty plate bedside.  States uses walker at baseline and lives alone.  His cousin is his next of kin.  Noted to be on O2 1.5 L via nasal cannula.  Does not use O2 at home.   Objective: Vitals:   04/24/19 1218 04/24/19 1700 04/24/19 1853 04/24/19 2020  BP: (!) 111/53 (!) 118/55 (!) 116/59 (!) 121/51  Pulse: (!) 50 (!) 53  (!) 56  Resp: 20 (!) 21  20  Temp:  98.4 F (36.9 C)  98.5 F (36.9 C)  TempSrc:  Oral  Oral  SpO2: 100% 99%  100%  Weight:      Height:        Intake/Output Summary (Last 24 hours) at 04/24/2019 2045 Last data filed at 04/24/2019 2020 Gross per 24 hour  Intake 250 ml  Output 1100 ml  Net -850 ml   Filed Weights   04/23/19 0910 04/24/19 0528  Weight: 86.2 kg 87.9 kg    Physical Examination:  General exam: Appears calm and  comfortable  Respiratory system: Decreased breath sounds at bases.  Clear to auscultation. Respiratory effort normal. Cardiovascular system: S1 & S2 heard, RRR. No JVD, murmurs, rubs, gallops or clicks. No pedal edema. Gastrointestinal system: Abdomen is nondistended, soft and nontender. No organomegaly or masses felt. Normal bowel sounds heard. Central nervous system: Alert and oriented. No focal neurological deficits. Extremities: Symmetric 5 x 5 power. Skin: No rashes, lesions or ulcers Psychiatry: Judgement and insight appear normal. Mood & affect appropriate.     Data Reviewed: I have personally reviewed following labs and imaging studies  CBC: Recent Labs  Lab 04/23/19 0915 04/24/19 0630  WBC 10.5 7.1  NEUTROABS 9.2*  --   HGB 12.3* 10.6*  HCT 38.2* 32.8*  MCV 90.1 88.6  PLT 232 [id number]   Basic Metabolic Panel: Recent Labs  Lab 04/23/19 0915 04/24/19 0630  NA 141 142  K 3.7 3.5  CL 106 108  CO2 21* 23  GLUCOSE 108* 105*  BUN 35* 31*  CREATININE 1.53* 1.57*  CALCIUM 9.1 8.6*   GFR: Estimated Creatinine Clearance: 42.2 mL/min (A) (by C-G formula based on SCr of 1.57 mg/dL (H)). Liver Function Tests: Recent Labs  Lab 04/23/19 0915  AST 22  ALT 20  ALKPHOS 42  BILITOT 1.9*  PROT 6.8  ALBUMIN 3.2*   No results for input(s): LIPASE, AMYLASE in the  last 168 hours. No results for input(s): AMMONIA in the last 168 hours. Coagulation Profile: No results for input(s): INR, PROTIME in the last 168 hours. Cardiac Enzymes: Recent Labs  Lab 04/23/19 0915  CKTOTAL 237   BNP (last 3 results) Recent Labs    01/05/19 1112  PROBNP 314   HbA1C: No results for input(s): HGBA1C in the last 72 hours. CBG: Recent Labs  Lab 04/23/19 2133 04/24/19 0737 04/24/19 1059 04/24/19 1639 04/24/19 2029  GLUCAP 99 97 111* 98 120*   Lipid Profile: No results for input(s): CHOL, HDL, LDLCALC, TRIG, CHOLHDL, LDLDIRECT in the last 72 hours. Thyroid Function Tests: Recent  Labs    04/23/19 1940  TSH 1.186   Anemia Panel: No results for input(s): VITAMINB12, FOLATE, FERRITIN, TIBC, IRON, RETICCTPCT in the last 72 hours. Sepsis Labs: Recent Labs  Lab 04/23/19 0915 04/23/19 1710 04/24/19 0630  PROCALCITON  --  0.11 0.13  LATICACIDVEN 0.9 0.7  --     Recent Results (from the past 240 hour(s))  SARS CORONAVIRUS 2 (TAT 6-24 HRS) Nasopharyngeal Nasopharyngeal Swab     Status: None   Collection Time: 04/23/19  5:48 PM   Specimen: Nasopharyngeal Swab  Result Value Ref Range Status   SARS Coronavirus 2 NEGATIVE NEGATIVE Final    Comment: (NOTE) SARS-CoV-2 target nucleic acids are NOT DETECTED. The SARS-CoV-2 RNA is generally detectable in upper and lower respiratory specimens during the acute phase of infection. Negative results do not preclude SARS-CoV-2 infection, do not rule out co-infections with other pathogens, and should not be used as the sole basis for treatment or other patient management decisions. Negative results must be combined with clinical observations, patient history, and epidemiological information. The expected result is Negative. Fact Sheet for Patients: SugarRoll.be Fact Sheet for Healthcare Providers: https://www.woods-mathews.com/ This test is not yet approved or cleared by the [place] FDA and  has been authorized for detection and/or diagnosis of SARS-CoV-2 by FDA under an Emergency Use Authorization (EUA). This EUA will remain  in effect (meaning this test can be used) for the duration of the COVID-19 declaration under Section 56 4(b)(1) of the Act, 21 U.S.C. section 360bbb-3(b)(1), unless the authorization is terminated or revoked sooner. Performed at [facility], [place] [address], [place], [place] [address]       Radiology Studies: Ct Head Wo Contrast  Result Date: 04/24/2019 CLINICAL DATA:  Abnormal prior CT. EXAM: CT HEAD WITHOUT CONTRAST TECHNIQUE: Contiguous  axial images were obtained from the base of the skull through the vertex without intravenous contrast. COMPARISON:  04/23/2019 FINDINGS: Brain: As on the prior study, the patient has been scanned with the head at an oblique orientation. The hypodensity seen previously in the anterior right temporal lobe and right cerebellum is less prominent on the current study. Diffuse loss of parenchymal volume is consistent with atrophy. Patchy low attenuation in the deep hemispheric and periventricular white matter is nonspecific, but likely reflects chronic microvascular ischemic demyelination. Vascular: No hyperdense vessel or unexpected calcification. Skull: No evidence for fracture. No worrisome lytic or sclerotic lesion. Sinuses/Orbits: Mild chronic mucosal thickening noted maxillary sinuses. Visualized portions of the globes and intraorbital fat are unremarkable. Other: None. IMPRESSION: 1. The hypodensity seen previously in the anterior right temporal lobe and right cerebellum is less prominent on today's study suggesting appearance on prior study was artifactual. 2. Atrophy with chronic small vessel white matter ischemic disease. Electronically Signed   By: Misty Stanley M.D.   On: 04/24/2019 09:04  Ct Head Wo Contrast  Result Date: 04/23/2019 CLINICAL DATA:  Neck pain and left facial bruising following a fall last night. EXAM: CT HEAD WITHOUT CONTRAST CT CERVICAL SPINE WITHOUT CONTRAST TECHNIQUE: Multidetector CT imaging of the head and cervical spine was performed following the standard protocol without intravenous contrast. Multiplanar CT image reconstructions of the cervical spine were also generated. COMPARISON:  07/08/2018. FINDINGS: CT HEAD FINDINGS Brain: Interval low density in the right cerebellum and ill-defined low density in the right temporal lobe. It is difficult to determine much of this is due to photon starvation at the skull base with the patient's head tilted Stable mildly enlarged ventricles  and cortical sulci. No intracranial hemorrhage or mass lesion. Vascular: No hyperdense vessel or unexpected calcification. Skull: No skull fracture. Sinuses/Orbits: Mild mucosal thickening involving all of the paranasal sinuses. Status post bilateral cataract extraction. Other: None. CT CERVICAL SPINE FINDINGS Alignment: Stable grade 1 retrolisthesis at the C5-6 level. Mild dextroconvex cervical scoliosis due to the patient's head tilted to the left, unchanged. Skull base and vertebrae: No acute fracture. No primary bone lesion or focal pathologic process. Soft tissues and spinal canal: No prevertebral fluid or swelling. No visible canal hematoma. Disc levels:  Multilevel degenerative changes. Upper chest: The included portions of the upper lobes are clear. Other: Bilateral carotid artery atheromatous calcifications. IMPRESSION: 1. 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 This could all be artifactual due to photon starvation related to the patient's head tilted in the gantry. However, acute infarction cannot be excluded in either area. If this is a clinical concern, recommend further evaluation with MRI of the brain. 2. No skull fracture or intracranial hemorrhage. 3. No cervical spine fracture or traumatic subluxation. 4. Stable mild diffuse cerebral and cerebellar atrophy. 5. Multilevel cervical spine degenerative changes. 6. Mild chronic pansinusitis. 7. Bilateral carotid artery atheromatous calcifications. Electronically Signed   By: Claudie Revering M.D.   On: 04/23/2019 10:34   Ct Cervical Spine Wo Contrast  Result Date: 04/23/2019 CLINICAL DATA:  Neck pain and left facial bruising following a fall last night. EXAM: CT HEAD WITHOUT CONTRAST CT CERVICAL SPINE WITHOUT CONTRAST TECHNIQUE: Multidetector CT imaging of the head and cervical spine was performed following the standard protocol without intravenous contrast. Multiplanar CT image  reconstructions of the cervical spine were also generated. COMPARISON:  07/08/2018. FINDINGS: CT HEAD FINDINGS Brain: Interval low density in the right cerebellum and ill-defined low density in the right temporal lobe. It is difficult to determine much of this is due to photon starvation at the skull base with the patient's head tilted Stable mildly enlarged ventricles and cortical sulci. No intracranial hemorrhage or mass lesion. Vascular: No hyperdense vessel or unexpected calcification. Skull: No skull fracture. Sinuses/Orbits: Mild mucosal thickening involving all of the paranasal sinuses. Status post bilateral cataract extraction. Other: None. CT CERVICAL SPINE FINDINGS Alignment: Stable grade 1 retrolisthesis at the C5-6 level. Mild dextroconvex cervical scoliosis due to the patient's head tilted to the left, unchanged. Skull base and vertebrae: No acute fracture. No primary bone lesion or focal pathologic process. Soft tissues and spinal canal: No prevertebral fluid or swelling. No visible canal hematoma. Disc levels:  Multilevel degenerative changes. Upper chest: The included portions of the upper lobes are clear. Other: Bilateral carotid artery atheromatous calcifications. IMPRESSION: 1. 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 This could all be artifactual due to photon starvation related to  the patient's head tilted in the gantry. However, acute infarction cannot be excluded in either area. If this is a clinical concern, recommend further evaluation with MRI of the brain. 2. No skull fracture or intracranial hemorrhage. 3. No cervical spine fracture or traumatic subluxation. 4. Stable mild diffuse cerebral and cerebellar atrophy. 5. Multilevel cervical spine degenerative changes. 6. Mild chronic pansinusitis. 7. Bilateral carotid artery atheromatous calcifications. Electronically Signed   By: Claudie Revering M.D.   On: 04/23/2019 10:34   Ct  Abdomen Pelvis W Contrast  Result Date: 04/23/2019 CLINICAL DATA:  Gas and fluid collection inferior to the right inferior pubic ramus on a portable pelvis radiograph earlier today. The patient fell. EXAM: CT ABDOMEN AND PELVIS WITH CONTRAST TECHNIQUE: Multidetector CT imaging of the abdomen and pelvis was performed using the standard protocol following bolus administration of intravenous contrast. CONTRAST:  151mL OMNIPAQUE IOHEXOL 300 MG/ML  SOLN COMPARISON:  Pelvis radiograph and portable chest radiograph obtained earlier today. Abdomen and pelvis CT dated 03/04/2009. FINDINGS: Lower chest: Dense consolidation in the right lower lobe with air bronchograms. Small right pleural effusion. Enlarged heart. Intracardiac pacer and AICD leads. Hepatobiliary: No focal liver abnormality is seen. No gallstones, gallbladder wall thickening, or biliary dilatation. Pancreas: Unremarkable. No pancreatic ductal dilatation or surrounding inflammatory changes. Spleen: Normal in size without focal abnormality. Adrenals/Urinary Tract: Stable post nephrectomy changes on the right. Normal appearing adrenal glands and left kidney. Interval tortuosity and mild focal dilatation of the distal left ureter, proximal to the ureterovesical junction. The distal ureter is normal in caliber. Mild diffuse bladder wall thickening with 2 small interval diverticula posteriorly, 1 on the left and 1 on the right. Stomach/Bowel: Unremarkable stomach, small bowel and colon. No evidence of appendicitis. Vascular/Lymphatic: Atheromatous arterial calcifications without aneurysm. No enlarged lymph nodes. Reproductive: Normal sized prostate gland containing small, coarse calcifications. Large bilateral hydroceles. Other: Small left inguinal hernia containing fat. No gas seen inferior to the right pubic ramus or in the proximal right thigh. Musculoskeletal: Lumbar and lower thoracic spine degenerative changes. IMPRESSION: 1. Dense consolidation in the  right lower lobe with air bronchograms, compatible with pneumonia or dense atelectasis. 2. Small right pleural effusion. 3. No gas seen inferior to the right inferior pubic ramus. The appearance seen previously was due to air trapped beneath the soft tissues of the buttock on the right. 4. Stable post nephrectomy changes on the right. 5. Mild diffuse bladder wall thickening with 2 small interval diverticula posteriorly. 6. Large bilateral hydroceles. 7. Small left inguinal hernia containing fat. Electronically Signed   By: Claudie Revering M.D.   On: 04/23/2019 13:41   Dg Pelvis Portable  Addendum Date: 04/23/2019   ADDENDUM REPORT: 04/23/2019 11:13 ADDENDUM: These results were called by telephone at the time of interpretation on 04/23/2019 at 11:13 am to provider [facility] , who verbally acknowledged these results. Electronically Signed   By: Zetta Bills M.D.   On: 04/23/2019 11:13   Result Date: 04/23/2019 CLINICAL DATA:  Fall. EXAM: PORTABLE PELVIS 1-2 VIEWS COMPARISON:  Abdomen pelvis CT from 2010. FINDINGS: Bubbly lucency just inferior to the right inferior pubic ramus measuring 7.4 x 3.5 cm. No acute bone finding. Hips are located with mild to moderate degenerative changes. IMPRESSION: Gas and fluid collection inferior to the right inferior pubic ramus may represent a soft tissue infection involving pelvic floor or upper thigh, transperineal hernia containing bowel could also have this appearance. Consider CT for further assessment. Electronically Signed: By: Zetta Bills  M.D. On: 04/23/2019 10:58   Dg Chest Portable 1 View  Result Date: 04/23/2019 CLINICAL DATA:  Fall. EXAM: PORTABLE CHEST 1 VIEW COMPARISON:  Chest x-ray 07/26/2018 FINDINGS: Hazy opacity at the right lung base with subtle increased interstitial markings and increased retrocardiac opacification as compared to previous study. Similar appearance at the left lung base. Upper chest is clear bilaterally. Cardiomediastinal  contours stable with cardiac enlargement. Right-sided dual lead pacer device remains in place. No acute bone finding. IMPRESSION: 1. Interstitial airspace opacities at the lung bases suspicious for developing developing pneumonia or pneumonitis from aspiration. Electronically Signed   By: Zetta Bills M.D.   On: 04/23/2019 10:54   Dg Swallowing Func-speech Pathology  Result Date: 04/24/2019 Objective Swallowing Evaluation: Type of Study: MBS-Modified Barium Swallow Study  Patient Details Name: SOM HUDA MRN: [id number] Date of Birth: [birth year] Today's Date: 04/24/2019 Time: SLP Start Time (ACUTE ONLY): 1150 -SLP Stop Time (ACUTE ONLY): 1210 SLP Time Calculation (min) (ACUTE ONLY): 20 min Past Medical History: Past Medical History: Diagnosis Date  Asthma   Albuterol prn;Symbicort daily  Atrial fibrillation (HCC)   takes COumadin daily  Bruises easily   d/t being on Coumadin  CAD (coronary artery disease)   predominantly single vessel  Cardiomyopathy, ischemic   Cataracts, bilateral to be removed in Aug 2015  Chronic kidney disease 1976  S/P nephrectomy  Constipation   takes Colace daily as needed  Depression   Dilated cardiomyopathy ([place])   s/p defibrillator Medtronic EnTrust [id number]  Dizziness   occasionally  GERD (gastroesophageal reflux disease)   takes OTC med if needed  Gout   takes Allopurinol daily  Heart failure ([place])   NYHFA     CLASS 3  History of hyperthyroidism   Hyperlipidemia   takes Fenofibrate daily  Hypertension   takes Bisoprolol daily as well as Imdur  Hypothyroidism   takes Synthroid daily  Joint pain   Joint swelling   Left knee DJD   needs surgery  Myocardial infarction ([place]) 2007  OSA (obstructive sleep apnea)   "don't wear mask; can't afford one" (12/20/2013)  Pacemaker   Peripheral edema   takes Furosemide daily  Pneumonia, community acquired last time 2014  "get it ~ q yr; haven't had it yet in 2015" (12/20/2013)  Seasonal allergies   takes Claritin daily   Shortness of breath   with exertion  Solitary kidney 06/05/2006  Urinary frequency   Urinary incontinence   Urinary urgency   Ventricular tachycardia ([place])   s/p defibrillator-Medtronic EnTrust D154 Past Surgical History: Past Surgical History: Procedure Laterality Date  CARDIAC CATHETERIZATION  2007  60% Stenosis mid-CFX  CARDIAC DEFIBRILLATOR PLACEMENT  2007  Medtronic EnTrust [place] REMOVAL  12/20/2013  CARDIOVERSION  06/18/2011  Procedure: CARDIOVERSION;  Surgeon: Kerry Hough., MD;  Location: [place];  Service: Cardiovascular;  Laterality: N/A;  CARDIOVERSION N/A 12/06/2012  Procedure: CARDIOVERSION;  Surgeon: Jacolyn Reedy, MD;  Location: [place];  Service: Cardiovascular;  Laterality: N/A;  COLONOSCOPY    ICD GENERATOR CHANGE  07/2012  ICD LEAD REMOVAL Left 12/20/2013  Procedure: ICD LEAD REMOVAL//EXTRACTION AND PLACEMENT OF TEMP/PERM ATRIAL LEAD;  Surgeon: Evans Lance, MD;  Location: [place];  Service: Cardiovascular;  Laterality: Left;  IMPLANTABLE CARDIOVERTER DEFIBRILLATOR IMPLANT N/A 01/25/2014  Procedure: IMPLANTABLE CARDIOVERTER DEFIBRILLATOR IMPLANT;  Surgeon: Evans Lance, MD;  Location: [facility] CATH LAB;  Service: Cardiovascular;  Laterality: N/A;  IMPLANTABLE CARDIOVERTER DEFIBRILLATOR REVISION N/A 07/13/2012  Procedure: IMPLANTABLE CARDIOVERTER DEFIBRILLATOR  REVISION;  Surgeon: Deboraha Sprang, MD;  Location: [facility] CATH LAB;  Service: Cardiovascular;  Laterality: N/A;  INSERT / REPLACE / REMOVE PACEMAKER  12/20/2013  KNEE ARTHROSCOPY Left   MULTIPLE TOOTH EXTRACTIONS    "top's are all gone; took some off the bottom too"  NEPHRECTOMY  1976  Right  TEE WITHOUT CARDIOVERSION  07/13/2012  Procedure: TRANSESOPHAGEAL ECHOCARDIOGRAM (TEE);  Surgeon: Lelon Perla, MD;  Location: [facility] ENDOSCOPY;  Service: Cardiovascular;  Laterality: N/A; HPI: 73yo male admitted 04/23/2019 after a fall. PMH: hypertension, paroxysmal atrial fibrillation on Eliquis, CAD, cardiomyopathy,  ventricular tachycardia s/p AICD, CKD stage III, s/p right nephrectomy OSA, hypothyroidism, gout, and arthritis  CXR = Interstitial airspace opacities at the lung bases suspicious for developing developing pneumonia or pneumonitis from aspiration.  Subjective: "is this why I'm coughing so much?" Assessment / Plan / Recommendation CHL IP CLINICAL IMPRESSIONS 04/24/2019 Clinical Impression Pt has a moderate oropharyngeal dysphagia that is possibly more chronic in nature. He has decreased oral control, resulting in anterior and posterior spillage and decreased bolus cohesion. He has intermittent oral residue. His base of tongue retraction is reduced and he has incomplete deflection of his epiglottis - moving downward but not backward. Silent aspiration occurs during the swallow with thin and nectar thick liquids. He seemed to have reduce aspriation events with a head turn to his left, but this did not completely eliminate aspiration from occurring. Honey thick liquids and solids are better contained in the valleculae and therefore are swallowed without aspiration., although residue in the valleculae increases with increased viscosity. Recommend Dys 2 diet and honey thick liquids by single cup sips. 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 It is unclear how this may have fluctuate over this period of time, but pt also reports having had at least four cases of PNA not including this one, although he is unclear of timing. Given that this may be at least acute on chronic, recommend considering palliative care to address overall GOC moving forward.  SLP Visit Diagnosis Dysphagia, oropharyngeal phase (R13.12) Attention and concentration deficit following -- Frontal lobe and executive function deficit following -- Impact on safety and function Moderate aspiration risk    CHL IP TREATMENT RECOMMENDATION 04/24/2019 Treatment Recommendations Therapy as outlined in treatment plan below   Prognosis 04/24/2019 Prognosis for Safe Diet Advancement Fair Barriers to Reach Goals Time post onset Barriers/Prognosis Comment -- CHL IP DIET RECOMMENDATION 04/24/2019 SLP Diet Recommendations Dysphagia 2 (Fine chop) solids;Honey thick liquids Liquid Administration via Cup;No straw Medication Administration Crushed with puree Compensations Minimize environmental distractions;Slow rate;Small sips/bites Postural Changes Seated upright at 90 degrees   CHL IP OTHER RECOMMENDATIONS 04/24/2019 Recommended Consults -- Oral Care Recommendations Oral care BID Other Recommendations Order thickener from pharmacy;Prohibited food (jello, ice cream, thin soups);Remove water pitcher   CHL IP FOLLOW UP RECOMMENDATIONS 04/24/2019 Follow up Recommendations (No Data)   CHL IP FREQUENCY AND DURATION 04/24/2019 Speech Therapy Frequency (ACUTE ONLY) min 2x/week Treatment Duration 2 weeks      CHL IP ORAL PHASE 04/24/2019 Oral Phase Impaired Oral - Pudding Teaspoon -- Oral - Pudding Cup -- Oral - Honey Teaspoon -- Oral - Honey Cup Right anterior bolus loss;Weak lingual manipulation;Reduced posterior propulsion;Lingual/palatal residue;Decreased bolus cohesion;Premature spillage Oral - Nectar Teaspoon -- Oral - Nectar Cup Right anterior bolus loss;Weak lingual manipulation;Reduced posterior propulsion;Lingual/palatal residue;Decreased bolus  cohesion;Premature spillage Oral - Nectar Straw Right anterior bolus loss;Weak lingual manipulation;Reduced posterior propulsion;Lingual/palatal residue;Decreased bolus cohesion;Premature spillage Oral - Thin Teaspoon -- Oral - Thin Cup Right anterior bolus loss;Weak lingual manipulation;Reduced posterior propulsion;Lingual/palatal residue;Decreased bolus cohesion;Premature spillage Oral - Thin Straw Right anterior bolus loss;Weak lingual manipulation;Reduced posterior  propulsion;Lingual/palatal residue;Decreased bolus cohesion;Premature spillage Oral - Puree Right anterior bolus loss;Weak lingual manipulation;Reduced posterior propulsion;Lingual/palatal residue;Decreased bolus cohesion;Premature spillage Oral - Mech Soft Right anterior bolus loss;Weak lingual manipulation;Reduced posterior propulsion;Lingual/palatal residue;Decreased bolus cohesion;Premature spillage Oral - Regular -- Oral - Multi-Consistency -- Oral - Pill -- Oral Phase - Comment --  CHL IP PHARYNGEAL PHASE 04/24/2019 Pharyngeal Phase Impaired Pharyngeal- Pudding Teaspoon -- Pharyngeal -- Pharyngeal- Pudding Cup -- Pharyngeal -- Pharyngeal- Honey Teaspoon -- Pharyngeal -- Pharyngeal- Honey Cup Reduced tongue base retraction;Reduced epiglottic inversion;Pharyngeal residue - valleculae Pharyngeal -- Pharyngeal- Nectar Teaspoon -- Pharyngeal -- Pharyngeal- Nectar Cup Reduced tongue base retraction;Reduced epiglottic inversion;Pharyngeal residue - valleculae;Penetration/Aspiration during swallow Pharyngeal Material enters airway, passes BELOW cords without attempt by patient to eject out (silent aspiration) Pharyngeal- Nectar Straw Reduced tongue base retraction;Reduced epiglottic inversion;Pharyngeal residue - valleculae;Penetration/Aspiration during swallow Pharyngeal Material enters airway, passes BELOW cords without attempt by patient to eject out (silent aspiration) Pharyngeal- Thin Teaspoon -- Pharyngeal -- Pharyngeal- Thin Cup Reduced tongue base retraction;Reduced epiglottic inversion;Penetration/Aspiration during swallow Pharyngeal Material enters airway, passes BELOW cords without attempt by patient to eject out (silent aspiration) Pharyngeal- Thin Straw Reduced tongue base retraction;Reduced epiglottic inversion;Penetration/Aspiration during swallow Pharyngeal Material enters airway, passes BELOW cords without attempt by patient to eject out (silent aspiration) Pharyngeal- Puree Reduced tongue base  retraction;Reduced epiglottic inversion;Pharyngeal residue - valleculae Pharyngeal -- Pharyngeal- Mechanical Soft Reduced tongue base retraction;Reduced epiglottic inversion;Pharyngeal residue - valleculae Pharyngeal -- Pharyngeal- Regular -- Pharyngeal -- Pharyngeal- Multi-consistency -- Pharyngeal -- Pharyngeal- Pill -- Pharyngeal -- Pharyngeal Comment --  CHL IP CERVICAL ESOPHAGEAL PHASE 04/24/2019 Cervical Esophageal Phase WFL Pudding Teaspoon -- Pudding Cup -- Honey Teaspoon -- Honey Cup -- Nectar Teaspoon -- Nectar Cup -- Nectar Straw -- Thin Teaspoon -- Thin Cup -- Thin Straw -- Puree -- Mechanical Soft -- Regular -- Multi-consistency -- Pill -- Cervical Esophageal Comment -- Venita Sheffield Nix 04/24/2019, 1:27 PM  Pollyann Glen, M.A. CCC-SLP Acute Rehabilitation Services Pager [phone] Office [phone]                  Scheduled Meds:  amiodarone  200 mg Oral Daily   apixaban  5 mg Oral BID   diclofenac Sodium  2 g Topical BID   fenofibrate  160 mg Oral Daily   guaiFENesin  600 mg Oral BID   levothyroxine  150 mcg Oral QAC breakfast   pantoprazole  40 mg Oral Daily   potassium chloride SA  20 mEq Oral Daily   risperiDONE  0.5 mg Oral q morning - 10a   risperiDONE  1 mg Oral QHS   sodium chloride flush  10-40 mL Intracatheter Q12H   sodium chloride flush  3 mL Intravenous Q12H   tamsulosin  0.4 mg Oral Daily   Continuous Infusions:  ampicillin-sulbactam (UNASYN) IV 3 g (04/24/19 1320)    Assessment & Plan:    1.  Acute metabolic encephalopathy: Present on admission.  CT head abnormal on presentation indicating stroke versus artifact.  Repeat CT this morning indicated that hypodensity seen previously in the anterior right temporal lobe and right cerebellum on prior study likely was artifactual as less prominent on today's study.  Patient has history of paroxysmal  A. fib and already on anticoagulation.  UA on admission showed pyuria without bacteriuria.  No evidence of  fever or leukocytosis.  Not sure if patient could have had concussion from fall.His mental status is back to baseline now.  CT head does show atrophy with chronic small vessel white matter ischemic disease--not sure if he has any baseline cognitive dysfunction.  Low suspicion for seizures as CK normal on admission.  AICD interrogation unremarkable.  Will discuss with next of kin.    2.  Fall: Unclear if preceded or succeeded problem #1.  He does have lower extremity weakness at baseline.  Not sure if he has had recurrent falls which would be concerning as patient on chronic anticoagulation.  Will need to discuss with next of kin.  Apparently lives alone.  3.  Aspiration pneumonia: Admitted with IV Unasyn.  Currently on 1.5 L O2, saturating 100%.  Taper as tolerated.  4.  Paroxysmal atrial fibrillation: Resume home medications including Eliquis/amiodarone.  5.  Hypertension/chronic diastolic CHF: Resume home medications, check orthostatics.  Last EF 60% with grade 1 diastolic dysfunction in [id number].  Patient does not appear grossly fluid overloaded at this time.  Will resume home diuretics in a.m.  6. CKD stage II-IIIa: S/p right nephrectomy.  Creatinine stable at baseline~ 1.5..  Monitor and avoid nephrotoxins.  AKI ruled out.  Can likely resume diuretics in a.m.  7.  Atrioventricular tachycardia: S/p AICD--this was interrogated on admission, unremarkable  8.  GERD: PPI  9.  Hyperlipidemia: Fenofibrate  10.  BPH: Flomax   DVT prophylaxis: Eliquis Code Status: Full code Family / Patient Communication: Discussed with patient.  Will discuss with cousin Disposition Plan: TBD.  PT/palliative care evaluation requested.     LOS: 1 day    Time spent: 35 minutes    Guilford Shi, MD [facility] Pager [phone]  If 7PM-7AM, please contact night-coverage [email or web address] Password [email or web address] 04/24/2019, 8:45 PM

## 2019-04-24 NOTE — Evaluation (Signed)
Clinical/Bedside Swallow Evaluation Patient Details  Name: Cody Faulkner MRN: [id number] Date of Birth: 12/05/1945  Today's Date: 04/24/2019 Time: SLP Start Time (ACUTE ONLY): 0900 SLP Stop Time (ACUTE ONLY): 0938 SLP Time Calculation (min) (ACUTE ONLY): 38 min  Past Medical History:  Past Medical History:  Diagnosis Date  . Asthma    Albuterol prn;Symbicort daily  . Atrial fibrillation ([place])    takes COumadin daily  . Bruises easily    d/t being on Coumadin  . CAD (coronary artery disease)    predominantly single vessel  . Cardiomyopathy, ischemic   . Cataracts, bilateral to be removed in Aug 2015  . Chronic kidney disease 1976   S/P nephrectomy  . Constipation    takes Colace daily as needed  . Depression   . Dilated cardiomyopathy ([place])    s/p defibrillator [employer] [phone]  . Dizziness    occasionally  . GERD (gastroesophageal reflux disease)    takes OTC med if needed  . Gout    takes Allopurinol daily  . Heart failure ([place])    NYHFA     CLASS 3  . History of hyperthyroidism   . Hyperlipidemia    takes Fenofibrate daily  . Hypertension    takes Bisoprolol daily as well as Imdur  . Hypothyroidism    takes Synthroid daily  . Joint pain   . Joint swelling   . Left knee DJD    needs surgery  . Myocardial infarction ([place]) 2007  . OSA (obstructive sleep apnea)    "don't wear mask; can't afford one" (12/20/2013)  . Pacemaker   . Peripheral edema    takes Furosemide daily  . Pneumonia, community acquired last time 2014   "get it ~ q yr; haven't had it yet in 2015" (12/20/2013)  . Seasonal allergies    takes Claritin daily  . Shortness of breath    with exertion  . Solitary kidney 06/05/2006  . Urinary frequency   . Urinary incontinence   . Urinary urgency   . Ventricular tachycardia ([place])    s/p defibrillator-[employer]Trust [id number]   Past Surgical History:  Past Surgical History:  Procedure Laterality Date  . CARDIAC CATHETERIZATION  2007   60%  Stenosis mid-CFX  . CARDIAC DEFIBRILLATOR PLACEMENT  2007   Medtronic EnTrust [phone]  . CARDIAC DEFIBRILLATOR REMOVAL  12/20/2013  . CARDIOVERSION  06/18/2011   Procedure: CARDIOVERSION;  Surgeon: Kerry Hough., MD;  Location: [place];  Service: Cardiovascular;  Laterality: N/A;  . CARDIOVERSION N/A 12/06/2012   Procedure: CARDIOVERSION;  Surgeon: Jacolyn Reedy, MD;  Location: [place];  Service: Cardiovascular;  Laterality: N/A;  . COLONOSCOPY    . ICD GENERATOR CHANGE  07/2012  . ICD LEAD REMOVAL Left 12/20/2013   Procedure: ICD LEAD REMOVAL//EXTRACTION AND PLACEMENT OF TEMP/PERM ATRIAL LEAD;  Surgeon: Evans Lance, MD;  Location: [place];  Service: Cardiovascular;  Laterality: Left;  . IMPLANTABLE CARDIOVERTER DEFIBRILLATOR IMPLANT N/A 01/25/2014   Procedure: IMPLANTABLE CARDIOVERTER DEFIBRILLATOR IMPLANT;  Surgeon: Evans Lance, MD;  Location: [facility] CATH LAB;  Service: Cardiovascular;  Laterality: N/A;  . IMPLANTABLE CARDIOVERTER DEFIBRILLATOR REVISION N/A 07/13/2012   Procedure: IMPLANTABLE CARDIOVERTER DEFIBRILLATOR REVISION;  Surgeon: Deboraha Sprang, MD;  Location: [facility] CATH LAB;  Service: Cardiovascular;  Laterality: N/A;  . INSERT / REPLACE / REMOVE PACEMAKER  12/20/2013  . KNEE ARTHROSCOPY Left   . MULTIPLE TOOTH EXTRACTIONS     "top's are all gone; took some off the bottom  too"  . NEPHRECTOMY  1976   Right  . TEE WITHOUT CARDIOVERSION  07/13/2012   Procedure: TRANSESOPHAGEAL ECHOCARDIOGRAM (TEE);  Surgeon: Lelon Perla, MD;  Location: [facility] ENDOSCOPY;  Service: Cardiovascular;  Laterality: N/A;   HPI:  73yo male admitted 04/23/2019 after a fall. PMH: hypertension, paroxysmal atrial fibrillation on Eliquis, CAD, cardiomyopathy, ventricular tachycardia s/p AICD, CKD stage III, s/p right nephrectomy OSA, hypothyroidism, gout, and arthritis  CXR = Interstitial airspace opacities at the lung bases suspicious for developing developing pneumonia or pneumonitis from aspiration.    Assessment / Plan / Recommendation Clinical Impression  Oral care was completed with suction. Pt required assistance with removal of upper and lower dentures. SLP cleaned dentures and replaced them after oral care. Pt exhibits harsh vocal quality, which was documented in 2013 following MBS. Trials of thin liquid, puree, and solid textures were given. Pt exhibited cough x1 after multiple presentations of thin liquid. Pt appears to tolerate puree and solid textures, however, he required assistance with self feeding.   Given history of dysphagia with aspiration of thin liquids and large boluses of nectar thick liquid, cognitive impairment, and abnormal vocal quality (per 2013 MBS), as well as current CXR results, and presentation at bedside today, MBS is recommended to objectively assess swallow function and safety. Scheduled with radiology for noon this date. Will continue NPO status until completion of study. Critical meds may be given whole with puree.   SLP Visit Diagnosis: Dysphagia, unspecified (R13.10)    Aspiration Risk  Moderate aspiration risk    Diet Recommendation Continue NPO until MBS.  Medication Administration: Whole meds with puree    Other  Recommendations Oral Care Recommendations: Oral care QID Other Recommendations: Have oral suction available     Follow up Recommendations 24 hour supervision/assistance      Frequency and Duration pending MBS results/recommendations       Prognosis Prognosis for Safe Diet Advancement: Fair Barriers to Reach Goals: Cognitive deficits      Swallow Study   General Date of Onset: 04/23/19 HPI: 73yo male admitted 04/23/2019 after a fall. PMH: hypertension, paroxysmal atrial fibrillation on Eliquis, CAD, cardiomyopathy, ventricular tachycardia s/p AICD, CKD stage III, s/p right nephrectomy OSA, hypothyroidism, gout, and arthritis  CXR = Interstitial airspace opacities at the lung bases suspicious for developing developing pneumonia  or pneumonitis from aspiration. Type of Study: Bedside Swallow Evaluation Previous Swallow Assessment: January 2013 - MBS - pen/asp thin, pen/asp lg/consec boluses NTL. Rec D2/NTL. Cog impairment, poor dentitin, glottal fry documented at that time. Diet Prior to this Study: NPO Temperature Spikes Noted: No Respiratory Status: Nasal cannula History of Recent Intubation: No Behavior/Cognition: Alert;Cooperative Oral Cavity Assessment: Within Functional Limits Oral Care Completed by SLP: Yes Oral Cavity - Dentition: Dentures, top;Dentures, bottom Vision: Functional for self-feeding Self-Feeding Abilities: Able to feed self;Needs set up Patient Positioning: Upright in bed Baseline Vocal Quality: Hoarse Volitional Cough: Strong;Congested Volitional Swallow: Able to elicit    Oral/Motor/Sensory Function Overall Oral Motor/Sensory Function: Within functional limits   Ice Chips Ice chips: Not tested   Thin Liquid Thin Liquid: Impaired Presentation: Straw;Self Fed Pharyngeal  Phase Impairments: Cough - Delayed(cough x1 after multiple presentations)    Nectar Thick Nectar Thick Liquid: Not tested   Honey Thick Honey Thick Liquid: Not tested   Puree Puree: Within functional limits Presentation: Self Fed   Solid     Solid: Within functional limits Presentation: [place], [place], [place] Speech Language Pathologist Office:  [phone]  Shonna Chock 04/24/2019,9:39 AM

## 2019-04-25 ENCOUNTER — Inpatient Hospital Stay (HOSPITAL_COMMUNITY): Payer: Medicare Other

## 2019-04-25 LAB — TSH: TSH: 0.791 u[IU]/mL (ref 0.350–4.500)

## 2019-04-25 LAB — GLUCOSE, CAPILLARY
Glucose-Capillary: 104 mg/dL — ABNORMAL HIGH (ref 70–99)
Glucose-Capillary: 108 mg/dL — ABNORMAL HIGH (ref 70–99)
Glucose-Capillary: 130 mg/dL — ABNORMAL HIGH (ref 70–99)
Glucose-Capillary: 154 mg/dL — ABNORMAL HIGH (ref 70–99)

## 2019-04-25 LAB — LEGIONELLA PNEUMOPHILA SEROGP 1 UR AG: L. pneumophila Serogp 1 Ur Ag: NEGATIVE

## 2019-04-25 LAB — PROCALCITONIN: Procalcitonin: <0.1 ng/mL

## 2019-04-25 MED ORDER — FUROSEMIDE 20 MG PO TABS
20.0000 mg | ORAL_TABLET | Freq: Every day | ORAL | Status: DC
  Administered 2019-04-25 – 2019-04-26 (×2): 20 mg via ORAL
  Filled 2019-04-25 (×2): qty 1

## 2019-04-25 NOTE — Evaluation (Signed)
Occupational Therapy Evaluation Patient Details Name: Cody Faulkner MRN: [id number] DOB: [birth year] Today's Date: 04/25/2019    History of Present Illness 73 y.o. male with medical history significant of hypertension, paroxysmal atrial fibrillation on Eliquis, CAD, cardiomyopathy, ventricular tachycardia s/p AICD, CKD stage III, s/p right nephrectomy OSA, hypothyroidism, gout, and arthritis.  He presents after being found face down at home.   Clinical Impression   Pt admitted with the above diagnoses and presents with below problem list. Pt will benefit from continued acute OT to address the below listed deficits and maximize independence with basic ADLs prior to d/c to venue below. At baseline, pt needs some assist with bathing and dressing which his family helps with. He reports he is usually able to walk to/from the bathroom with AD by himself. Pt presents with significant decreased activity tolerance, generalized weakness. Pt sat EOB several minutes at min guard level. Reported lightheadedness throughout sitting EOB which did not improve or worsen with time. Orthostatics negative. Pt then returned to supine with +2 assist. Pt currently mod A with UB ADLs, mod-max +2 assist with LB ADLs.      Follow Up Recommendations  SNF    Equipment Recommendations  Other (comment)(defer to next venue)    Recommendations for Other Services       Precautions / Restrictions Precautions Precautions: Fall Restrictions Weight Bearing Restrictions: No      Mobility Bed Mobility Overal bed mobility: Needs Assistance Bed Mobility: Supine to Sit;Sit to Supine     Supine to sit: +2 for physical assistance;Total assist Sit to supine: +2 for physical assistance;Total assist   General bed mobility comments: assist to raise trunk and pivot hips to EOB, pt 20%; pt reported dizziness in sitting, orthostatics negative for supine to sit (see flowsheets), pt sat edge of bed ~10 minutes  Transfers                 General transfer comment: NT 2* pt weakness, dizziness    Balance Overall balance assessment: Needs assistance Sitting-balance support: Feet supported;Bilateral upper extremity supported Sitting balance-[name]: Poor Sitting balance - Comments: LOB posteriorly when B feet and UEs not supported; can maintain sitting with feet and UEs supported                                   ADL either performed or assessed with clinical judgement   ADL Overall ADL's : Needs assistance/impaired Eating/Feeding: Set up;Sitting   Grooming: Sitting;Moderate assistance Grooming Details (indicate cue type and reason): sits with BUE support Upper Body Bathing: Sitting;Moderate assistance   Lower Body Bathing: +2 for physical assistance;Maximal assistance;Sitting/lateral leans;Moderate assistance;+2 for safety/equipment   Upper Body Dressing : Moderate assistance;Maximal assistance;Sitting   Lower Body Dressing: Maximal assistance;+2 for physical assistance;Moderate assistance;+2 for safety/equipment                 General ADL Comments: Pt limited by feeling lightheaded EOB even after several minutes in this position. Sits with BUE support. Pt unable to try standing today.      Vision         Perception     Praxis      Pertinent Vitals/Pain Pain Assessment: Faces Faces Pain Scale: Hurts little more Pain Location: L knee with movement Pain Descriptors / Indicators: Guarding;Grimacing Pain Intervention(s): Limited activity within patient's tolerance;Monitored during session;Repositioned     Hand Dominance Right   Extremity/Trunk Assessment Upper  Extremity Assessment Upper Extremity Assessment: Generalized weakness   Lower Extremity Assessment Lower Extremity Assessment: Defer to PT evaluation RLE Deficits / Details: R knee ext -4/5 LLE Deficits / Details: knee ext AROM -45* limited by pain, pt stated, "both knees need replacements", unable to  discern (2* HOH) if L knee pain is baseline or 2* fall   Cervical / Trunk Assessment Cervical / Trunk Assessment: Normal   Communication Communication Communication: HOH   Cognition Arousal/Alertness: Awake/alert Behavior During Therapy: WFL for tasks assessed/performed Overall Cognitive Status: Within Functional Limits for tasks assessed                                     General Comments       Exercises General Exercises - Lower Extremity Long Arc Quad: AROM;Right;5 reps;Seated(unable to tolerate on L 2* knee pain)   Shoulder Instructions      Home Living Family/patient expects to be discharged to:: Skilled nursing facility Living Arrangements: Alone                                      Prior Functioning/Environment Level of Independence: Independent with assistive device(s)        Comments: pt reports he walked with a RW, family stopped by as they were able, difficult to obtain detailed PLOF as pt is very HOH        OT Problem List: Decreased strength;Decreased activity tolerance;Impaired balance (sitting and/or standing);Decreased knowledge of use of DME or AE;Decreased knowledge of precautions;Cardiopulmonary status limiting activity;Pain      OT Treatment/Interventions: Self-care/ADL training;Therapeutic exercise;Energy conservation;DME and/or AE instruction;Therapeutic activities;Patient/family education;Balance training    OT Goals(Current goals can be found in the care plan section) Acute Rehab OT Goals Patient Stated Goal: to get stronger OT Goal Formulation: With patient Time For Goal Achievement: 05/09/19 Potential to Achieve Goals: Good ADL Goals Pt Will Perform Grooming: with min guard assist;sitting Pt Will Perform Upper Body Bathing: with min assist;sitting Pt Will Transfer to Toilet: with mod assist;stand pivot transfer;bedside commode Pt Will Perform Toileting - Clothing Manipulation and hygiene: with mod assist;sit  to/from stand Additional ADL Goal #1: Pt will complete bed mobility at mod A level to prepare for EOB/OOB ADLs  OT Frequency: Min 2X/week   Barriers to D/C:            Co-evaluation PT/OT/SLP Co-Evaluation/Treatment: Yes Reason for Co-Treatment: Complexity of the patient's impairments (multi-system involvement);For patient/therapist safety;To address functional/ADL transfers PT goals addressed during session: Mobility/safety with mobility;Balance;Strengthening/ROM OT goals addressed during session: ADL's and self-care      AM-PAC OT "6 Clicks" Daily Activity     Outcome Measure Help from another person eating meals?: A Little Help from another person taking care of personal grooming?: A Lot Help from another person toileting, which includes using toliet, bedpan, or urinal?: Total Help from another person bathing (including washing, rinsing, drying)?: Total Help from another person to put on and taking off regular upper body clothing?: A Lot Help from another person to put on and taking off regular lower body clothing?: A Lot 6 Click Score: 11   End of Session    Activity Tolerance: Other (comment)(lightheaded EOB) Patient left: in bed;with call bell/phone within reach;with bed alarm set  OT Visit Diagnosis: Unsteadiness on feet (R26.81);Muscle weakness (generalized) (M62.81);History of falling (Z91.81);Pain  Time: [id number] OT Time Calculation (min): 25 min Charges:  OT General Charges $OT Visit: 1 Visit OT Evaluation $OT Eval Moderate Complexity: [place], OT Acute Rehabilitation Services Pager: [phone] Office: [phone]   Hortencia Pilar 04/25/2019, 1:43 PM

## 2019-04-25 NOTE — TOC Initial Note (Signed)
Transition of Care [facility]) - Initial/Assessment Note    Patient Details  Name: Cody Faulkner MRN: [id number] Date of Birth: 1946/05/15  Transition of Care [facility]) CM/SW Contact:    Bethena Roys, RN Phone Number: 04/25/2019, 4:59 PM  Clinical Narrative:   Pt presented after being found down in the home. PTA from home alone; however, has cousins that he states checks on him during the day. PT/OT recommendations for SNF. Medicare.gov list presented to patient. He states his cousin may stop by tonight and they review the list. Patient is undecided about SNF at this time. CM did get verbal permission to call the cousin [name]- received voicemail. CM will discuss with patient on 04-26-19 plan of care. CM will continue to follow for transition of care needs.                 Expected Discharge Plan: Skilled Nursing Facility Barriers to Discharge: Continued Medical Work up   Patient Goals and CMS Choice Patient states their goals for this hospitalization and ongoing recovery are:: "to feel better" CMS Medicare.gov Compare Post Acute Care list provided to:: Patient Choice offered to / list presented to : Patient  Expected Discharge Plan and Services Expected Discharge Plan: [place] In-house Referral: NA Discharge Planning Services: CM Consult Post Acute Care Choice: [place] Living arrangements for the past 2 months: Single Family Home                  Prior Living Arrangements/Services Living arrangements for the past 2 months: Single Family Home Lives with:: Self(has a cousin that visits 3 x day) Patient language and need for interpreter reviewed:: Yes Do you feel safe going back to the place where you live?: Yes      Need for Family Participation in Patient Care: Yes (Comment) Care giver support system in place?: Yes (comment)   Criminal Activity/Legal Involvement Pertinent to Current Situation/Hospitalization: No - Comment as  needed  Activities of Daily Living      Permission Sought/Granted Permission sought to share information with : Family Supports, Customer service manager    Share Information with NAME: Gery Pray           Emotional Assessment Appearance:: Appears stated age Attitude/Demeanor/Rapport: Engaged Affect (typically observed): Accepting Orientation: : Oriented to Self, Oriented to Place, Oriented to  Time Alcohol / Substance Use: Not Applicable Psych Involvement: No (comment)  Admission diagnosis:  Aspiration pneumonia of right lower lobe, unspecified aspiration pneumonia type [facility]) [J69.0] Patient Active Problem List   Diagnosis Date Noted  . Aspiration pneumonia ([place]) 04/23/2019  . Fall at home, initial encounter 04/23/2019  . Urinary incontinence   . Primary osteoarthritis of both knees   . History of ventricular tachycardia   . Hallucinations   . B12 deficiency 07/27/2018  . Psychosis ([place])   . AMS (altered mental status) 07/26/2018  . Chronic gout of left knee   . Unsteady gait   . Primary osteoarthritis of left knee   . HTN (hypertension), benign   . Weakness 07/12/2018  . Delirium 05/30/2018  . On amiodarone therapy 05/18/2018  . Chronic anticoagulation- Coumadin 12/23/2013  . Sinus node dysfunction ([place]) 12/21/2013  . Paroxysmal ventricular tachycardia ([place]) 12/21/2013  . ICD (implantable cardioverter-defibrillator) lead failure 12/20/2013  . Obesity hypoventilation syndrome ([place]) 12/07/2012  . History of hyperthyroidism   . CKD (chronic kidney disease) stage 3, GFR 30-59 ml/min 06/23/2011  . Morbid obesity   . Long term current use  of anticoagulant therapy   . OSA (obstructive sleep apnea)   . Hypertensive heart disease with heart failure ([place])   . Solitary kidney   . Community acquired pneumonia 06/06/2011  . Acute encephalopathy 06/06/2011  . Hypothyroidism   . Mild persistent asthma   . PAF (paroxysmal atrial fibrillation) ([place])   . ICD  (implantable cardioverter-defibrillator) in place   . History of dilated cardiomyopathy- last EF 50-55% 2D July 2014   . Chronic diastolic CHF (congestive heart failure) ([place])   . CAD nonobstructive- 2007 11/12/2005   PCP:  Cyndi Bender, PA-C Pharmacy:   [place], [place] - [place] [address] Phone: [phone] Fax: [phone]  [id number] - [place], [place] [address] [place] [address] Phone: [phone] Fax: [phone]     Social Determinants of Health (SDOH) Interventions    Readmission Risk Interventions Readmission Risk Prevention Plan 04/25/2019  Transportation Screening Complete  HRI or [place] Complete  Social Work Consult for [place] Planning/Counseling Complete  Palliative Care Screening Not Applicable  Medication Review Press photographer) Complete  Some recent data might be hidden

## 2019-04-25 NOTE — Progress Notes (Addendum)
PROGRESS NOTE    Cody Faulkner  [id number]  DOB: December 14, 1945  DOA: 04/23/2019 PCP: Cyndi Bender, PA-C  Brief Narrative:  73 y.o. male with medical history significant of hypertension, paroxysmal atrial fibrillation on Eliquis, CAD, cardiomyopathy, ventricular tachycardia s/p AICD, CKD stage III, s/p right nephrectomy OSA, hypothyroidism, gout, and arthritis.  He presents after being found face down at home--apparently fell the night before and could not wake up.  Patient does not recollect the event. ED Course: Interrogation of the patient's AICD did not reveal any acute arrhythmias.  Chest x-ray was concerning for the possibility of a developing pneumonia or aspiration pneumonitis. Patient was started on antibiotics of Unasyn.  Initial CT scan of the brain was abnormal indicating right cerebellar and temporal lobe infarcts versus artifact.  Patient was unable to have a MRI of the brain because of his AICD.  TRH called to admit. 11/17 additional history obtained from family: Patient's cousin who is listed as next of kin provided further history that patient has history of recurrent falls and started declining since fall early this year.  Patient apparently hit his head at that time and had concussion related hallucinations and confusion.  He was started on Risperdal.  Cousin also gives history of early dementia in the family.  Apparently cousin's father found patient down at home and called EMS.  Patient at baseline can only ambulate a few feet with a walker.  He has home health PT coming once a week but family members check on him twice a day and also help him with food supplies.  Subjective:  Patient awake alert oriented x3 although very hard of hearing.  PT at bedside during rounds and had difficulty standing him up due to balance issues as well as complaint of bilateral knee pain now exacerbated in the left knee since the fall.   Noted to be on O2 2 L via nasal cannula.  Does not use O2 at  home.   Objective: Vitals:   04/25/19 0921 04/25/19 1125 04/25/19 1135 04/25/19 1618  BP: (!) 112/51 (!) 106/48  (!) 121/52  Pulse: (!) 52 (!) 58 60 62  Resp: 17 20  (!) 21  Temp: 98.8 F (37.1 C) (!) 97.5 F (36.4 C)  98 F (36.7 C)  TempSrc: Oral Oral  Oral  SpO2: 97% 100% 100% 99%  Weight:      Height:        Intake/Output Summary (Last 24 hours) at 04/25/2019 1813 Last data filed at 04/25/2019 1300 Gross per 24 hour  Intake 1153 ml  Output 900 ml  Net 253 ml   Filed Weights   04/23/19 0910 04/24/19 0528 04/25/19 0500  Weight: 86.2 kg 87.9 kg 89.2 kg    Physical Examination:  General exam: Appears calm and comfortable  Respiratory system: Decreased breath sounds at bases.  Clear to auscultation. Respiratory effort normal. Cardiovascular system: S1 & S2 heard, RRR. No JVD, murmurs, rubs, gallops or clicks. No pedal edema. Gastrointestinal system: Abdomen is nondistended, soft and nontender. No organomegaly or masses felt. Normal bowel sounds heard. Central nervous system: Alert and oriented.  Hard of hearing.    No focal deficits per se Extremities:Unable to bear weight and likely has some chronic ataxia.  Bilateral knee arthritis and limited range of motion. Skin: Hyperpigmented skin in lower part of bilateral lower extremities Psychiatry: Judgement and insight appear okay.  Mood & affect appropriate.     Data Reviewed: I have personally reviewed following labs and  imaging studies  CBC: Recent Labs  Lab 04/23/19 0915 04/24/19 0630  WBC 10.5 7.1  NEUTROABS 9.2*  --   HGB 12.3* 10.6*  HCT 38.2* 32.8*  MCV 90.1 88.6  PLT 232 [id number]   Basic Metabolic Panel: Recent Labs  Lab 04/23/19 0915 04/24/19 0630  NA 141 142  K 3.7 3.5  CL 106 108  CO2 21* 23  GLUCOSE 108* 105*  BUN 35* 31*  CREATININE 1.53* 1.57*  CALCIUM 9.1 8.6*   GFR: Estimated Creatinine Clearance: 42.6 mL/min (A) (by C-G formula based on SCr of 1.57 mg/dL (H)). Liver Function  Tests: Recent Labs  Lab 04/23/19 0915  AST 22  ALT 20  ALKPHOS 42  BILITOT 1.9*  PROT 6.8  ALBUMIN 3.2*   No results for input(s): LIPASE, AMYLASE in the last 168 hours. No results for input(s): AMMONIA in the last 168 hours. Coagulation Profile: No results for input(s): INR, PROTIME in the last 168 hours. Cardiac Enzymes: Recent Labs  Lab 04/23/19 0915  CKTOTAL 237   BNP (last 3 results) Recent Labs    01/05/19 1112  PROBNP 314   HbA1C: No results for input(s): HGBA1C in the last 72 hours. CBG: Recent Labs  Lab 04/24/19 1639 04/24/19 2029 04/25/19 0811 04/25/19 1124 04/25/19 1616  GLUCAP 98 120* 104* 108* 130*   Lipid Profile: No results for input(s): CHOL, HDL, LDLCALC, TRIG, CHOLHDL, LDLDIRECT in the last 72 hours. Thyroid Function Tests: Recent Labs    04/25/19 0836  TSH 0.791   Anemia Panel: No results for input(s): VITAMINB12, FOLATE, FERRITIN, TIBC, IRON, RETICCTPCT in the last 72 hours. Sepsis Labs: Recent Labs  Lab 04/23/19 0915 04/23/19 1710 04/24/19 0630 04/25/19 0922  PROCALCITON  --  0.11 0.13 <0.10  LATICACIDVEN 0.9 0.7  --   --     Recent Results (from the past 240 hour(s))  SARS CORONAVIRUS 2 (TAT 6-24 HRS) Nasopharyngeal Nasopharyngeal Swab     Status: None   Collection Time: 04/23/19  5:48 PM   Specimen: Nasopharyngeal Swab  Result Value Ref Range Status   SARS Coronavirus 2 NEGATIVE NEGATIVE Final    Comment: (NOTE) SARS-CoV-2 target nucleic acids are NOT DETECTED. The SARS-CoV-2 RNA is generally detectable in upper and lower respiratory specimens during the acute phase of infection. Negative results do not preclude SARS-CoV-2 infection, do not rule out co-infections with other pathogens, and should not be used as the sole basis for treatment or other patient management decisions. Negative results must be combined with clinical observations, patient history, and epidemiological information. The expected result is  Negative. Fact Sheet for Patients: SugarRoll.be Fact Sheet for Healthcare Providers: https://www.woods-mathews.com/ This test is not yet approved or cleared by the [place] FDA and  has been authorized for detection and/or diagnosis of SARS-CoV-2 by FDA under an Emergency Use Authorization (EUA). This EUA will remain  in effect (meaning this test can be used) for the duration of the COVID-19 declaration under Section 56 4(b)(1) of the Act, 21 U.S.C. section 360bbb-3(b)(1), unless the authorization is terminated or revoked sooner. Performed at [facility], [place] [address], [place], [place] [address]       Radiology Studies: Ct Head Wo Contrast  Result Date: 04/24/2019 CLINICAL DATA:  Abnormal prior CT. EXAM: CT HEAD WITHOUT CONTRAST TECHNIQUE: Contiguous axial images were obtained from the base of the skull through the vertex without intravenous contrast. COMPARISON:  04/23/2019 FINDINGS: Brain: As on the prior study, the patient has been scanned with  the head at an oblique orientation. The hypodensity seen previously in the anterior right temporal lobe and right cerebellum is less prominent on the current study. Diffuse loss of parenchymal volume is consistent with atrophy. Patchy low attenuation in the deep hemispheric and periventricular white matter is nonspecific, but likely reflects chronic microvascular ischemic demyelination. Vascular: No hyperdense vessel or unexpected calcification. Skull: No evidence for fracture. No worrisome lytic or sclerotic lesion. Sinuses/Orbits: Mild chronic mucosal thickening noted maxillary sinuses. Visualized portions of the globes and intraorbital fat are unremarkable. Other: None. IMPRESSION: 1. The hypodensity seen previously in the anterior right temporal lobe and right cerebellum is less prominent on today's study suggesting appearance on prior study was artifactual. 2. Atrophy with chronic small  vessel white matter ischemic disease. Electronically Signed   By: Misty Stanley M.D.   On: 04/24/2019 09:04   Dg Swallowing Func-speech Pathology  Result Date: 04/24/2019 Objective Swallowing Evaluation: Type of Study: MBS-Modified Barium Swallow Study  Patient Details Name: NAGEE PREY MRN: [id number] Date of Birth: [birth year] Today's Date: 04/24/2019 Time: SLP Start Time (ACUTE ONLY): 1150 -SLP Stop Time (ACUTE ONLY): 1210 SLP Time Calculation (min) (ACUTE ONLY): 20 min Past Medical History: Past Medical History: Diagnosis Date  Asthma   Albuterol prn;Symbicort daily  Atrial fibrillation (HCC)   takes COumadin daily  Bruises easily   d/t being on Coumadin  CAD (coronary artery disease)   predominantly single vessel  Cardiomyopathy, ischemic   Cataracts, bilateral to be removed in Aug 2015  Chronic kidney disease 1976  S/P nephrectomy  Constipation   takes Colace daily as needed  Depression   Dilated cardiomyopathy ([place])   s/p defibrillator Medtronic EnTrust [id number]  Dizziness   occasionally  GERD (gastroesophageal reflux disease)   takes OTC med if needed  Gout   takes Allopurinol daily  Heart failure ([place])   NYHFA     CLASS 3  History of hyperthyroidism   Hyperlipidemia   takes Fenofibrate daily  Hypertension   takes Bisoprolol daily as well as Imdur  Hypothyroidism   takes Synthroid daily  Joint pain   Joint swelling   Left knee DJD   needs surgery  Myocardial infarction ([place]) 2007  OSA (obstructive sleep apnea)   "don't wear mask; can't afford one" (12/20/2013)  Pacemaker   Peripheral edema   takes Furosemide daily  Pneumonia, community acquired last time 2014  "get it ~ q yr; haven't had it yet in 2015" (12/20/2013)  Seasonal allergies   takes Claritin daily  Shortness of breath   with exertion  Solitary kidney 06/05/2006  Urinary frequency   Urinary incontinence   Urinary urgency   Ventricular tachycardia ([place])   s/p defibrillator-Medtronic EnTrust D154 Past Surgical History:  Past Surgical History: Procedure Laterality Date  CARDIAC CATHETERIZATION  2007  60% Stenosis mid-CFX  CARDIAC DEFIBRILLATOR PLACEMENT  2007  Medtronic EnTrust [place] REMOVAL  12/20/2013  CARDIOVERSION  06/18/2011  Procedure: CARDIOVERSION;  Surgeon: Kerry Hough., MD;  Location: [place];  Service: Cardiovascular;  Laterality: N/A;  CARDIOVERSION N/A 12/06/2012  Procedure: CARDIOVERSION;  Surgeon: Jacolyn Reedy, MD;  Location: [place];  Service: Cardiovascular;  Laterality: N/A;  COLONOSCOPY    ICD GENERATOR CHANGE  07/2012  ICD LEAD REMOVAL Left 12/20/2013  Procedure: ICD LEAD REMOVAL//EXTRACTION AND PLACEMENT OF TEMP/PERM ATRIAL LEAD;  Surgeon: Evans Lance, MD;  Location: [place];  Service: Cardiovascular;  Laterality: Left;  IMPLANTABLE CARDIOVERTER DEFIBRILLATOR IMPLANT N/A 01/25/2014  Procedure: IMPLANTABLE  CARDIOVERTER DEFIBRILLATOR IMPLANT;  Surgeon: Evans Lance, MD;  Location: [facility] CATH LAB;  Service: Cardiovascular;  Laterality: N/A;  IMPLANTABLE CARDIOVERTER DEFIBRILLATOR REVISION N/A 07/13/2012  Procedure: IMPLANTABLE CARDIOVERTER DEFIBRILLATOR REVISION;  Surgeon: Deboraha Sprang, MD;  Location: [facility] CATH LAB;  Service: Cardiovascular;  Laterality: N/A;  INSERT / REPLACE / REMOVE PACEMAKER  12/20/2013  KNEE ARTHROSCOPY Left   MULTIPLE TOOTH EXTRACTIONS    "top's are all gone; took some off the bottom too"  NEPHRECTOMY  1976  Right  TEE WITHOUT CARDIOVERSION  07/13/2012  Procedure: TRANSESOPHAGEAL ECHOCARDIOGRAM (TEE);  Surgeon: Lelon Perla, MD;  Location: [facility] ENDOSCOPY;  Service: Cardiovascular;  Laterality: N/A; HPI: 73yo male admitted 04/23/2019 after a fall. PMH: hypertension, paroxysmal atrial fibrillation on Eliquis, CAD, cardiomyopathy, ventricular tachycardia s/p AICD, CKD stage III, s/p right nephrectomy OSA, hypothyroidism, gout, and arthritis  CXR = Interstitial airspace opacities at the lung bases suspicious for developing developing pneumonia or  pneumonitis from aspiration.  Subjective: "is this why I'm coughing so much?" Assessment / Plan / Recommendation CHL IP CLINICAL IMPRESSIONS 04/24/2019 Clinical Impression Pt has a moderate oropharyngeal dysphagia that is possibly more chronic in nature. He has decreased oral control, resulting in anterior and posterior spillage and decreased bolus cohesion. He has intermittent oral residue. His base of tongue retraction is reduced and he has incomplete deflection of his epiglottis - moving downward but not backward. Silent aspiration occurs during the swallow with thin and nectar thick liquids. He seemed to have reduce aspriation events with a head turn to his left, but this did not completely eliminate aspiration from occurring. Honey thick liquids and solids are better contained in the valleculae and therefore are swallowed without aspiration., although residue in the valleculae increases with increased viscosity. Recommend Dys 2 diet and honey thick liquids by single cup sips. 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 It is unclear how this may have fluctuate over this period of time, but pt also reports having had at least four cases of PNA not including this one, although he is unclear of timing. Given that this may be at least acute on chronic, recommend considering palliative care to address overall GOC moving forward.  SLP Visit Diagnosis Dysphagia, oropharyngeal phase (R13.12) Attention and concentration deficit following -- Frontal lobe and executive function deficit following -- Impact on safety and function Moderate aspiration risk   CHL IP TREATMENT RECOMMENDATION 04/24/2019 Treatment Recommendations Therapy as outlined in treatment plan below   Prognosis 04/24/2019 Prognosis for Safe Diet Advancement Fair Barriers to Reach Goals Time post onset  Barriers/Prognosis Comment -- CHL IP DIET RECOMMENDATION 04/24/2019 SLP Diet Recommendations Dysphagia 2 (Fine chop) solids;Honey thick liquids Liquid Administration via Cup;No straw Medication Administration Crushed with puree Compensations Minimize environmental distractions;Slow rate;Small sips/bites Postural Changes Seated upright at 90 degrees   CHL IP OTHER RECOMMENDATIONS 04/24/2019 Recommended Consults -- Oral Care Recommendations Oral care BID Other Recommendations Order thickener from pharmacy;Prohibited food (jello, ice cream, thin soups);Remove water pitcher   CHL IP FOLLOW UP RECOMMENDATIONS 04/24/2019 Follow up Recommendations (No Data)   CHL IP FREQUENCY AND DURATION 04/24/2019 Speech Therapy Frequency (ACUTE ONLY) min 2x/week Treatment Duration 2 weeks      CHL IP ORAL PHASE 04/24/2019 Oral Phase Impaired Oral - Pudding Teaspoon -- Oral - Pudding Cup -- Oral - Honey Teaspoon -- Oral - Honey  Cup Right anterior bolus loss;Weak lingual manipulation;Reduced posterior propulsion;Lingual/palatal residue;Decreased bolus cohesion;Premature spillage Oral - Nectar Teaspoon -- Oral - Nectar Cup Right anterior bolus loss;Weak lingual manipulation;Reduced posterior propulsion;Lingual/palatal residue;Decreased bolus cohesion;Premature spillage Oral - Nectar Straw Right anterior bolus loss;Weak lingual manipulation;Reduced posterior propulsion;Lingual/palatal residue;Decreased bolus cohesion;Premature spillage Oral - Thin Teaspoon -- Oral - Thin Cup Right anterior bolus loss;Weak lingual manipulation;Reduced posterior propulsion;Lingual/palatal residue;Decreased bolus cohesion;Premature spillage Oral - Thin Straw Right anterior bolus loss;Weak lingual manipulation;Reduced posterior propulsion;Lingual/palatal residue;Decreased bolus cohesion;Premature spillage Oral - Puree Right anterior bolus loss;Weak lingual manipulation;Reduced posterior propulsion;Lingual/palatal residue;Decreased bolus cohesion;Premature  spillage Oral - Mech Soft Right anterior bolus loss;Weak lingual manipulation;Reduced posterior propulsion;Lingual/palatal residue;Decreased bolus cohesion;Premature spillage Oral - Regular -- Oral - Multi-Consistency -- Oral - Pill -- Oral Phase - Comment --  CHL IP PHARYNGEAL PHASE 04/24/2019 Pharyngeal Phase Impaired Pharyngeal- Pudding Teaspoon -- Pharyngeal -- Pharyngeal- Pudding Cup -- Pharyngeal -- Pharyngeal- Honey Teaspoon -- Pharyngeal -- Pharyngeal- Honey Cup Reduced tongue base retraction;Reduced epiglottic inversion;Pharyngeal residue - valleculae Pharyngeal -- Pharyngeal- Nectar Teaspoon -- Pharyngeal -- Pharyngeal- Nectar Cup Reduced tongue base retraction;Reduced epiglottic inversion;Pharyngeal residue - valleculae;Penetration/Aspiration during swallow Pharyngeal Material enters airway, passes BELOW cords without attempt by patient to eject out (silent aspiration) Pharyngeal- Nectar Straw Reduced tongue base retraction;Reduced epiglottic inversion;Pharyngeal residue - valleculae;Penetration/Aspiration during swallow Pharyngeal Material enters airway, passes BELOW cords without attempt by patient to eject out (silent aspiration) Pharyngeal- Thin Teaspoon -- Pharyngeal -- Pharyngeal- Thin Cup Reduced tongue base retraction;Reduced epiglottic inversion;Penetration/Aspiration during swallow Pharyngeal Material enters airway, passes BELOW cords without attempt by patient to eject out (silent aspiration) Pharyngeal- Thin Straw Reduced tongue base retraction;Reduced epiglottic inversion;Penetration/Aspiration during swallow Pharyngeal Material enters airway, passes BELOW cords without attempt by patient to eject out (silent aspiration) Pharyngeal- Puree Reduced tongue base retraction;Reduced epiglottic inversion;Pharyngeal residue - valleculae Pharyngeal -- Pharyngeal- Mechanical Soft Reduced tongue base retraction;Reduced epiglottic inversion;Pharyngeal residue - valleculae Pharyngeal -- Pharyngeal-  Regular -- Pharyngeal -- Pharyngeal- Multi-consistency -- Pharyngeal -- Pharyngeal- Pill -- Pharyngeal -- Pharyngeal Comment --  CHL IP CERVICAL ESOPHAGEAL PHASE 04/24/2019 Cervical Esophageal Phase WFL Pudding Teaspoon -- Pudding Cup -- Honey Teaspoon -- Honey Cup -- Nectar Teaspoon -- Nectar Cup -- Nectar Straw -- Thin Teaspoon -- Thin Cup -- Thin Straw -- Puree -- Mechanical Soft -- Regular -- Multi-consistency -- Pill -- Cervical Esophageal Comment -- Venita Sheffield Nix 04/24/2019, 1:27 PM  Pollyann Glen, M.A. CCC-SLP Acute Rehabilitation Services Pager [phone] Office [phone]                  Scheduled Meds:  amiodarone  200 mg Oral Daily   apixaban  5 mg Oral BID   diclofenac Sodium  2 g Topical BID   fenofibrate  160 mg Oral Daily   furosemide  20 mg Oral Daily   guaiFENesin  600 mg Oral BID   levothyroxine  150 mcg Oral QAC breakfast   pantoprazole  40 mg Oral Daily   potassium chloride SA  20 mEq Oral Daily   risperiDONE  0.5 mg Oral q morning - 10a   risperiDONE  1 mg Oral QHS   sodium chloride flush  10-40 mL Intracatheter Q12H   sodium chloride flush  3 mL Intravenous Q12H   tamsulosin  0.4 mg Oral Daily   Continuous Infusions:  ampicillin-sulbactam (UNASYN) IV 3 g (04/25/19 1519)    Assessment & Plan:    1.  Acute metabolic encephalopathy: Present on admission.  CT head abnormal on presentation indicating  stroke versus artifact.  Repeat CT 11/16 indicated that hypodensity seen previously in the anterior right temporal lobe and right cerebellum on prior study likely was artifactual as less prominent on repeat study.     UA on admission showed pyuria without bacteriuria.  No evidence of fever or leukocytosis. His mental status is back to baseline now.  CT head does show cerebral as well as cerebellar atrophy with chronic small vessel white matter ischemic disease--on talking to family it appears that he does have some baseline cognitive dysfunction--he likely  has underlying dementia.  Low suspicion for seizures as CK normal on admission.  AICD interrogation unremarkable.  Resumed on risperidone that he takes at home (QTC okay at 422 ms), no evidence of hallucinations while here.   2.  Recurrent falls: Unclear if fall prior to this admission preceded or succeeded problem #1.  He does have lower extremity weakness and ataxia at baseline which is likely contributing to recurrent falls.  Uses a walker.  He may have orthostasis-PT to check when able to stand patient.  Cousin states patient has had recurrent falls which is concerning as patient on chronic anticoagulation.  3.  Aspiration pneumonia: Admitted with IV Unasyn.  Currently on 1.5 L O2, saturating 100%.  Taper as tolerated.  4.  Paroxysmal atrial fibrillation: Resume home medications including Eliquis/amiodarone.  Need to discuss risk and benefits of continuing and discontinuing anticoagulation in the setting of recurrent falls especially if he is going to be going home.  Probably okay to continue if going to nursing facility.  5.  Hypertension/chronic diastolic CHF: Resume home medications, check orthostatics.  Last EF 60% with grade 1 diastolic dysfunction in [id number].  Patient does not appear grossly fluid overloaded at this time.   Patient on high dose of Lasix (80 mg) at home.  Will resume at a low dose and titrate as needed.  6. CKD stage II-IIIa: S/p right nephrectomy.  Creatinine stable at baseline~ 1.5..  Monitor and avoid nephrotoxins.  AKI ruled out.  Can likely resume diuretics in a.m.  7.  Atrioventricular tachycardia: S/p AICD--this was interrogated on admission, unremarkable  8.  Bilateral knee arthritis: Patient apparently had arthroscopic procedure on both knees in the past.  Complaining of worsening left knee pain since the fall, will obtain x-ray.  9.  Hyperlipidemia: Fenofibrate  10.  BPH: Flomax  11. GERD: PPI   DVT prophylaxis: Eliquis Code Status: Full code Family /  Patient Communication: Discussed with patient and his cousin over the phone.  Cousin understands overall poor prognosis and open to palliative care discussions.  He feels patient not safe to be living by himself and would need daily home health services at the least if and when discharged home. Disposition Plan: PT/palliative care evaluation requested.  PT recommended skilled nursing facility.  Social work to contact cousin with options     LOS: 2 days    Time spent: 9 minutes    Guilford Shi, MD [facility] Pager [phone]  If 7PM-7AM, please contact night-coverage [email or web address] Password [facility] 04/25/2019, 6:13 PM

## 2019-04-25 NOTE — Plan of Care (Signed)
  Problem: Self-Care: Goal: Ability to communicate needs accurately will improve Outcome: Completed/Met   

## 2019-04-25 NOTE — Progress Notes (Signed)
  Speech Language Pathology Treatment: Dysphagia  Patient Details Name: NADEN DOOLEN MRN: [id number] DOB: 27-Nov-1945 Today's Date: 04/25/2019 Time: [id number] SLP Time Calculation (min) (ACUTE ONLY): 17 min  Assessment / Plan / Recommendation Clinical Impression  Pt was seen for f/u after MBS on previous date. Education was reinforced about results and recommendations, as pt recalls some information (like need for thickened liquids) but not necessarily the rationale. With use of teach back he was better able to show his understanding. Pt drank 4 oz of honey thick liquids and ate 4 oz of puree with no overt s/s of aspiration, needing only Min cues for smaller boluses to increase safety. Recommend continuing current diet and precautions for now.    HPI HPI: 73yo male admitted 04/23/2019 after a fall. PMH: hypertension, paroxysmal atrial fibrillation on Eliquis, CAD, cardiomyopathy, ventricular tachycardia s/p AICD, CKD stage III, s/p right nephrectomy OSA, hypothyroidism, gout, and arthritis  CXR = Interstitial airspace opacities at the lung bases suspicious for developing developing pneumonia or pneumonitis from aspiration.      SLP Plan  Continue with current plan of care       Recommendations  Diet recommendations: Dysphagia 2 (fine chop);Honey-thick liquid Liquids provided via: Cup;No straw Medication Administration: Whole meds with puree(crush larger ones) Supervision: Intermittent supervision to cue for compensatory strategies Compensations: Minimize environmental distractions;Slow rate;Small sips/bites Postural Changes and/or Swallow Maneuvers: Seated upright 90 degrees;Upright 30-60 min after meal                Oral Care Recommendations: Oral care BID Follow up Recommendations: Skilled Nursing facility SLP Visit Diagnosis: Dysphagia, oropharyngeal phase (R13.12) Plan: Continue with current plan of care       GO                Venita Sheffield Dontavius Keim 04/25/2019, 4:46  PM  Pollyann Glen, M.A. [place] Acute Environmental education officer [phone] Office [phone]

## 2019-04-25 NOTE — Evaluation (Addendum)
Physical Therapy Evaluation Patient Details Name: Cody Faulkner MRN: [id number] DOB: May 16, 1946 Today's Date: 04/25/2019   History of Present Illness  73 y.o. male with medical history significant of hypertension, paroxysmal atrial fibrillation on Eliquis, CAD, cardiomyopathy, ventricular tachycardia s/p AICD, CKD stage III, s/p right nephrectomy OSA, hypothyroidism, gout, and arthritis.  He presents after being found face down at home.  Clinical Impression  Pt admitted with above diagnosis. +2 total assist for bed mobility, pt sat edge of bed x ~10 minutes with poor sitting balance. Pt reported dizziness while sitting, orthostatics negative (see flowsheet). Pt is quite deconditioned, SNF recommended as he lives alone.  Pt currently with functional limitations due to the deficits listed below (see PT Problem List). Pt will benefit from skilled PT to increase their independence and safety with mobility to allow discharge to the venue listed below.       Follow Up Recommendations SNF    Equipment Recommendations  None recommended by PT    Recommendations for Other Services       Precautions / Restrictions Precautions Precautions: Fall Restrictions Weight Bearing Restrictions: No      Mobility  Bed Mobility Overal bed mobility: Needs Assistance Bed Mobility: Supine to Sit;Sit to Supine     Supine to sit: +2 for physical assistance;Total assist Sit to supine: +2 for physical assistance;Total assist   General bed mobility comments: assist to raise trunk and pivot hips to EOB, pt 20%; pt reported dizziness in sitting, orthostatics negative for supine to sit (see flowsheets), pt sat edge of bed ~10 minutes  Transfers                 General transfer comment: NT 2* pt weakness, dizziness  Ambulation/Gait                Stairs            Wheelchair Mobility    Modified Rankin (Stroke Patients Only)       Balance Overall balance assessment: Needs  assistance Sitting-balance support: Feet supported;Bilateral upper extremity supported Sitting balance-[name]: Poor Sitting balance - Comments: LOB posteriorly when B feet and UEs not supported; can maintain sitting with feet and UEs supported                                     Pertinent Vitals/Pain Pain Assessment: Faces Faces Pain Scale: Hurts little more Pain Location: L knee with movement Pain Descriptors / Indicators: Guarding;Grimacing Pain Intervention(s): Limited activity within patient's tolerance;Monitored during session;Repositioned    Home Living Family/patient expects to be discharged to:: Skilled nursing facility Living Arrangements: Alone                    Prior Function Level of Independence: Independent with assistive device(s)         Comments: pt reports he walked with a RW, family stopped by as they were able, difficult to obtain detailed PLOF as pt is very HOH     Hand Dominance        Extremity/Trunk Assessment   Upper Extremity Assessment Upper Extremity Assessment: Defer to OT evaluation    Lower Extremity Assessment Lower Extremity Assessment: LLE deficits/detail;RLE deficits/detail(pitting edema noted B feet) RLE Deficits / Details: R knee ext -4/5 LLE Deficits / Details: knee ext AROM -45* limited by pain, pt stated, "both knees need replacements", unable to discern (2* HOH) if L  knee pain is baseline or 2* fall    Cervical / Trunk Assessment Cervical / Trunk Assessment: Normal  Communication   Communication: HOH  Cognition Arousal/Alertness: Awake/alert Behavior During Therapy: WFL for tasks assessed/performed Overall Cognitive Status: Within Functional Limits for tasks assessed                                        General Comments      Exercises  long arc quads x 5 L; unable to tolerate on R 2* knee pain   Assessment/Plan    PT Assessment Patient needs continued PT services  PT  Problem List Decreased strength;Decreased range of motion;Decreased activity tolerance;Decreased balance;Decreased mobility;Pain       PT Treatment Interventions Functional mobility training;Therapeutic activities;Therapeutic exercise;Patient/family education    PT Goals (Current goals can be found in the Care Plan section)  Acute Rehab PT Goals Patient Stated Goal: to get stronger PT Goal Formulation: With patient Time For Goal Achievement: 05/15/19 Potential to Achieve Goals: Fair    Frequency Min 2X/week   Barriers to discharge        Co-evaluation PT/OT/SLP Co-Evaluation/Treatment: Yes Reason for Co-Treatment: Complexity of the patient's impairments (multi-system involvement);For patient/therapist safety;To address functional/ADL transfers PT goals addressed during session: Mobility/safety with mobility;Balance;Strengthening/ROM         AM-PAC PT "6 Clicks" Mobility  Outcome Measure Help needed turning from your back to your side while in a flat bed without using bedrails?: Total Help needed moving from lying on your back to sitting on the side of a flat bed without using bedrails?: Total Help needed moving to and from a bed to a chair (including a wheelchair)?: Total Help needed standing up from a chair using your arms (e.g., wheelchair or bedside chair)?: Total Help needed to walk in hospital room?: Total Help needed climbing 3-5 steps with a railing? : Total 6 Click Score: 6    End of Session Equipment Utilized During Treatment: Gait belt;Oxygen Activity Tolerance: Patient limited by fatigue Patient left: in bed;with call bell/phone within reach;with bed alarm set Nurse Communication: Mobility status PT Visit Diagnosis: Muscle weakness (generalized) (M62.81);Difficulty in walking, not elsewhere classified (R26.2);Pain;History of falling (Z91.81);Dizziness and giddiness (R42) Pain - Right/Left: Left Pain - part of body: Knee    Time: 1131-1158 PT Time Calculation  (min) (ACUTE ONLY): 27 min   Charges:   PT Evaluation $PT Eval Moderate Complexity: 1 Mod          Philomena Doheny PT 04/25/2019  Acute Rehabilitation Services Pager [phone] Office [phone]

## 2019-04-26 ENCOUNTER — Encounter (HOSPITAL_COMMUNITY): Payer: Self-pay | Admitting: Primary Care

## 2019-04-26 DIAGNOSIS — Z515 Encounter for palliative care: Secondary | ICD-10-CM

## 2019-04-26 DIAGNOSIS — Z7189 Other specified counseling: Secondary | ICD-10-CM

## 2019-04-26 LAB — GLUCOSE, CAPILLARY
Glucose-Capillary: 98 mg/dL (ref 70–99)
Glucose-Capillary: 128 mg/dL — ABNORMAL HIGH (ref 70–99)
Glucose-Capillary: 107 mg/dL — ABNORMAL HIGH (ref 70–99)
Glucose-Capillary: 126 mg/dL — ABNORMAL HIGH (ref 70–99)

## 2019-04-26 MED ORDER — IPRATROPIUM-ALBUTEROL 0.5-2.5 (3) MG/3ML IN SOLN: 3.0000 mL | RESPIRATORY_TRACT | Status: DC | PRN

## 2019-04-26 MED ORDER — POLYETHYLENE GLYCOL 3350 17 G PO PACK: 17.0000 g | PACK | Freq: Every day | ORAL | Status: DC | PRN

## 2019-04-26 MED ORDER — SENNOSIDES-DOCUSATE SODIUM 8.6-50 MG PO TABS: 2.0000 | ORAL_TABLET | Freq: Every evening | ORAL | Status: DC | PRN

## 2019-04-26 NOTE — NC FL2 (Signed)
[place] LEVEL OF CARE SCREENING TOOL     IDENTIFICATION  Patient Name: Cody Faulkner Birthdate: February 06, 1946 Sex: male Admission Date (Current Location): 04/23/2019  [facility] and [place] Number:  Herbalist and Address:         Provider Number: [id number]  Attending Physician Name and Address:  Damita Lack, MD  Relative Name and Phone Number:  Nettie Facenda: Maudry Diego [id number]    Current Level of Care: Hospital Recommended Level of Care: [facility] Prior Approval Number:    Date Approved/Denied:   PASRR Number: [id number]  Discharge Plan: SNF    Current Diagnoses: Patient Active Problem List   Diagnosis Date Noted  . Goals of care, counseling/discussion   . Palliative care by specialist   . DNR (do not resuscitate) discussion   . Aspiration pneumonia ([place]) 04/23/2019  . Fall at home, initial encounter 04/23/2019  . Urinary incontinence   . Primary osteoarthritis of both knees   . History of ventricular tachycardia   . Hallucinations   . B12 deficiency 07/27/2018  . Psychosis ([place])   . AMS (altered mental status) 07/26/2018  . Chronic gout of left knee   . Unsteady gait   . Primary osteoarthritis of left knee   . HTN (hypertension), benign   . Weakness 07/12/2018  . Delirium 05/30/2018  . On amiodarone therapy 05/18/2018  . Chronic anticoagulation- Coumadin 12/23/2013  . Sinus node dysfunction ([place]) 12/21/2013  . Paroxysmal ventricular tachycardia ([place]) 12/21/2013  . ICD (implantable cardioverter-defibrillator) lead failure 12/20/2013  . Obesity hypoventilation syndrome ([place]) 12/07/2012  . History of hyperthyroidism   . CKD (chronic kidney disease) stage 3, GFR 30-59 ml/min 06/23/2011  . Morbid obesity   . Long term current use of anticoagulant therapy   . OSA (obstructive sleep apnea)   . Hypertensive heart disease with heart failure ([place])   . Solitary kidney   . Community acquired pneumonia 06/06/2011  .  Acute encephalopathy 06/06/2011  . Hypothyroidism   . Mild persistent asthma   . PAF (paroxysmal atrial fibrillation) ([place])   . ICD (implantable cardioverter-defibrillator) in place   . History of dilated cardiomyopathy- last EF 50-55% 2D July 2014   . Chronic diastolic CHF (congestive heart failure) ([place])   . CAD nonobstructive- 2007 11/12/2005    Orientation RESPIRATION BLADDER Height & Weight     Self, Place  Normal Incontinent Weight: 90.4 kg Height:  5\' 6"  (167.6 cm)  BEHAVIORAL SYMPTOMS/MOOD NEUROLOGICAL BOWEL NUTRITION STATUS      Continent Diet(Dysphagia 2 Honey Thick)  AMBULATORY STATUS COMMUNICATION OF NEEDS Skin   Extensive Assist Verbally Skin abrasions(facial)                       Personal Care Assistance Level of Assistance  Bathing, Feeding, Dressing Bathing Assistance: Limited assistance Feeding assistance: Limited assistance Dressing Assistance: Limited assistance     Functional Limitations Info  Sight, Hearing, Speech Sight Info: Impaired(glasses) Hearing Info: Impaired(hard of hearing) Speech Info: Adequate    SPECIAL CARE FACTORS FREQUENCY  PT (By licensed PT), OT (By licensed OT), Speech therapy     PT Frequency: 5 x week OT Frequency: 5 x week     Speech Therapy Frequency: continue plan of care per notes      Contractures Contractures Info: Not present    Additional Factors Info  Code Status, Allergies, Psychotropic Code Status Info: Full Code Allergies Info: Methimazole Psychotropic Info: risperiDONE (RISPERDAL) tablet 1 mg  Current Medications (04/26/2019):  This is the current hospital active medication list Current Facility-Administered Medications  Medication Dose Route Frequency Provider Last Rate Last Dose  . acetaminophen (TYLENOL) tablet 650 mg  650 mg Oral Q6H PRN Fuller Plan A, MD   650 mg at 04/26/19 [id number]   Or  . acetaminophen (TYLENOL) suppository 650 mg  650 mg Rectal Q6H PRN Fuller Plan A, MD      .  amiodarone (PACERONE) tablet 200 mg  200 mg Oral Daily Tamala Julian, Rondell A, MD   200 mg at 04/26/19 1111  . apixaban (ELIQUIS) tablet 5 mg  5 mg Oral BID Fuller Plan A, MD   5 mg at 04/26/19 1114  . diclofenac Sodium (VOLTAREN) 1 % topical gel 2 g  2 g Topical BID Einar Grad, RPH   2 g at 04/26/19 [id number]  . fenofibrate tablet 160 mg  160 mg Oral Daily Smith, Rondell A, MD   160 mg at 04/26/19 1112  . furosemide (LASIX) tablet 20 mg  20 mg Oral Daily Guilford Shi, MD   20 mg at 04/26/19 1113  . guaiFENesin (MUCINEX) 12 hr tablet 600 mg  600 mg Oral BID Fuller Plan A, MD   600 mg at 04/26/19 1111  . ipratropium-albuterol (DUONEB) 0.5-2.5 (3) MG/3ML nebulizer solution 3 mL  3 mL Nebulization Q4H PRN Amin, Ankit Chirag, MD      . levothyroxine (SYNTHROID) tablet 150 mcg  150 mcg Oral QAC breakfast Fuller Plan A, MD   150 mcg at 04/26/19 0546  . ondansetron (ZOFRAN) tablet 4 mg  4 mg Oral Q6H PRN Fuller Plan A, MD       Or  . ondansetron (ZOFRAN) injection 4 mg  4 mg Intravenous Q6H PRN Smith, Rondell A, MD      . pantoprazole (PROTONIX) EC tablet 40 mg  40 mg Oral Daily Tamala Julian, Rondell A, MD   40 mg at 04/26/19 1114  . polyethylene glycol (MIRALAX / GLYCOLAX) packet 17 g  17 g Oral Daily PRN Amin, Ankit Chirag, MD      . potassium chloride SA (KLOR-CON) CR tablet 20 mEq  20 mEq Oral Daily Tamala Julian, Rondell A, MD   20 mEq at 04/26/19 1113  . Resource ThickenUp Clear   Oral PRN Gu[place] Shi, MD      . risperiDONE (RISPERDAL) tablet 0.5 mg  0.5 mg Oral q morning - 10a Einar Grad, RPH   0.5 mg at 04/26/19 1114  . risperiDONE (RISPERDAL) tablet 1 mg  1 mg Oral QHS Einar Grad, RPH   1 mg at 04/25/19 2151  . senna-docusate (Senokot-S) tablet 2 tablet  2 tablet Oral QHS PRN Amin, Ankit Chirag, MD      . sodium chloride flush (NS) 0.9 % injection 10-40 mL  10-40 mL Intracatheter Q12H Smith, Rondell A, MD   10 mL at 04/26/19 1118  . sodium chloride flush (NS) 0.9 % injection  10-40 mL  10-40 mL Intracatheter PRN Fuller Plan A, MD      . tamsulosin (FLOMAX) capsule 0.4 mg  0.4 mg Oral Daily Tamala Julian, Rondell A, MD   0.4 mg at 04/26/19 1112     Discharge Medications: Please see discharge summary for a list of discharge medications.  Relevant Imaging Results:  Relevant Lab Results:   Additional Information SSN # [id number]  Graves-Bigelow, Ocie Cornfield, RN

## 2019-04-26 NOTE — TOC Progression Note (Signed)
Transition of Care [facility]) - Progression Note    Patient Details  Name: JUDEA FANG MRN: [id number] Date of Birth: May 17, 1946  [facility] CM/SW Contact  Graves-Bigelow, Ocie Cornfield, RN Phone Number: 04/26/2019, 12:27 PM  Clinical Narrative:   CM was able to speak with [name] regarding plan of care for patient. Plan will be for SNF- family aware that they will need to discuss additional plan of care for long-term. First choice- [facility], 2nd Choice [facility] and 3rd choice [facility]-clinicals faxed. CM completed FL2- received PASRR. CM contacted [place] to initiate authorization-Reference # received is [phone]. Clinicals submitted to [id number]. Awaiting on determination. CM will continue to follow for additional transition of care needs.     Expected Discharge Plan: [place] Barriers to Discharge: Continued Medical Work up  Expected Discharge Plan and Services Expected Discharge Plan: [place] In-house Referral: NA Discharge Planning Services: CM Consult Post Acute Care Choice: [place] Living arrangements for the past 2 months: Single Family Home                   Social Determinants of Health (SDOH) Interventions    Readmission Risk Interventions Readmission Risk Prevention Plan 04/25/2019  Transportation Screening Complete  HRI or [place] Complete  Social Work Consult for [place] Planning/Counseling Complete  Palliative Care Screening Not Applicable  Medication Review Press photographer) Complete  Some recent data might be hidden

## 2019-04-26 NOTE — Consult Note (Signed)
Consultation Note Date: 04/26/2019   Patient Name: Cody Faulkner  DOB: 1945/12/08  MRN: [id number]  Age / Sex: 73 y.o., male  PCP: Cody Bender, PA-C Referring Physician: Damita Lack, MD  Reason for Consultation: Establishing goals of care  HPI/Patient Profile: 73 y.o. male  with past medical history of hypertension, paroxysmal atrial fibrillation on Eliquis, CAD, cardiomyopathy, ventricular tachycardia s/p AICD, CKD stage III, s/p right nephrectomy OSA, hypothyroidism, gout, and arthritis admitted on 04/23/2019 with acute metabolic encephalopathy, stroke versus artifact.   Clinical Assessment and Goals of Care: Cody Faulkner is lying quietly in bed.  He greets me making but not keeping eye contact.  He appears acutely/chronically ill and frail.  He is oriented to person and place, but not time.  He is able to make his basic needs known.  There is no family at bedside at this time.    We talked about Cody Faulkner home life.  He shares that he lives alone, cooks food in the microwave, able to do his own bathing and dressing but it had recently become "a chore".  He shares that his cousin weighed and Cody Faulkner's sister Cody Faulkner get his groceries and other supplies for him.  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  He tells me that he also worked at [employer]  We briefly talked about his acute and chronic illnesses, but Cody Faulkner seems to have very poor insight to his disease processes.  We talked about his stroke, and some problems related to stroke.  We talked about his change in diet, honey thickened liquids.  He shares that he has not had a good drink of water in quite some time.  We talked about healthcare power of attorney, see below.  Cody Faulkner is unable to give me a direct answer as to who would speak for him if he were unable. We also talked about CODE STATUS, but is clear that Cody Faulkner is  not able to fully participate in this discussion.  Call to cousin, Cody Faulkner at [phone].  Cody Faulkner tells me that they do what they can for his cousin Cody Faulkner, "but we are right behind him".  He shares that Cody Faulkner and Cody Faulkner, relatives listed in chart are his sons.  Cody Faulkner shares that he is seen a great decline in [place] over the last few weeks/months.  He shares that Cody Faulkner worked most of his life, retiring at around 55.  He confirms that Cody Faulkner has no other living relatives.  I share that we are worried about Cody Faulkner's ability to recover, live independently.  Cody Faulkner shares that he feels like Cody Faulkner will have to live in a nursing home.  Cody Faulkner shares that he would like for me to call his son Cody Faulkner, stating that he feels that Cody Faulkner would better understand.    Call to cousin, Cody Faulkner at [phone].  Goes right to voicemail, generic voicemail message left.  Conference with attending and RN case management related to patient condition, needs, contact person, and  disposition.  HCPOA    NEXT OF KIN -Cody Faulkner tells me that he never married, has no children.  He shares that his parents are gone and he was an only child.  He shares that he has several cousins who help care for him.  He most frequently speaks of his cousin Cody Faulkner, and [place] Sister Cody Faulkner. The law states reasonably available next of kin would be surrogate decision maker.    SUMMARY OF RECOMMENDATIONS   SNF rehab Anticipate long-term care resident Continue CODE STATUS discussions. Would benefit from outpatient palliative services  Code Status/Advance Care Planning:  Full code -we briefly talk about CODE STATUS.  Cody Faulkner is clearly unable to have detailed discussions.   Symptom Management:   Per hospitalist, no additional needs at this time.  Palliative Prophylaxis:   Palliative Wound Care and Turn Reposition  Additional Recommendations (Limitations, Scope, Preferences):  Full Scope Treatment  Psycho-social/Spiritual:   Desire for further Chaplaincy  support:no  Additional Recommendations: Caregiving  Support/Resources and Education on Hospice  Prognosis:   Unable to determine, based on outcomes.  6 months or less would not be surprising based on declining functional status over the last few months, acuity of chronic illness burden, stroke with dysphagia to/honey thickened liquids.  Discharge Planning: Agreeable to short-term rehab, facility undecided.  Anticipate transition to long-term care resident.      Primary Diagnoses: Present on Admission: . Aspiration pneumonia ([place]) . CAD nonobstructive- 2007 . CKD (chronic kidney disease) stage 3, GFR 30-59 ml/min . ICD (implantable cardioverter-defibrillator) in place . Hypothyroidism   I have reviewed the medical record, interviewed the patient and family, and examined the patient. The following aspects are pertinent.  Past Medical History:  Diagnosis Date  . Asthma    Albuterol prn;Symbicort daily  . Atrial fibrillation ([place])    takes COumadin daily  . Bruises easily    d/t being on Coumadin  . CAD (coronary artery disease)    predominantly single vessel  . Cardiomyopathy, ischemic   . Cataracts, bilateral to be removed in Aug 2015  . Chronic kidney disease 1976   S/P nephrectomy  . Constipation    takes Colace daily as needed  . Depression   . Dilated cardiomyopathy ([place])    s/p defibrillator [employer] [phone]  . Dizziness    occasionally  . GERD (gastroesophageal reflux disease)    takes OTC med if needed  . Gout    takes Allopurinol daily  . Heart failure ([place])    NYHFA     CLASS 3  . History of hyperthyroidism   . Hyperlipidemia    takes Fenofibrate daily  . Hypertension    takes Bisoprolol daily as well as Imdur  . Hypothyroidism    takes Synthroid daily  . Joint pain   . Joint swelling   . Left knee DJD    needs surgery  . Myocardial infarction ([place]) 2007  . OSA (obstructive sleep apnea)    "don't wear mask; can't afford one" (12/20/2013)  .  Pacemaker   . Peripheral edema    takes Furosemide daily  . Pneumonia, community acquired last time 2014   "get it ~ q yr; haven't had it yet in 2015" (12/20/2013)  . Seasonal allergies    takes Claritin daily  . Shortness of breath    with exertion  . Solitary kidney 06/05/2006  . Urinary frequency   . Urinary incontinence   . Urinary urgency   . Ventricular tachycardia ([place])  s/p defibrillator-[employer]tronic EnTrust [id number]   Social History   Socioeconomic History  . Marital status: Single    Spouse name: Not on file  . Number of children: Not on file  . Years of education: Not on file  . Highest education level: Not on file  Occupational History  . Occupation: disabled  Social Needs  . Financial resource strain: Not on file  . Food insecurity    Worry: Not on file    Inability: Not on file  . Transportation needs    Medical: Not on file    Non-medical: Not on file  Tobacco Use  . Smoking status: Never Smoker  . Smokeless tobacco: Never Used  Substance and Sexual Activity  . Alcohol use: Yes    Comment: 12/20/2013 "might have drank a couple beers before; never really drank"  . Drug use: No  . Sexual activity: Not Currently    Birth control/protection: None  Lifestyle  . Physical activity    Days per week: Not on file    Minutes per session: Not on file  . Stress: Not on file  Relationships  . Social Herbalist on phone: Not on file    Gets together: Not on file    Attends religious service: Not on file    Active member of club or organization: Not on file    Attends meetings of clubs or organizations: Not on file    Relationship status: Not on file  Other Topics Concern  . Not on file  Social History Narrative   Never married.  Worked previously in [employer].   Family History  Problem Relation Age of Onset  . Heart disease Other        uncle  . Diabetes Other        uncle   Scheduled Meds: . amiodarone  200 mg Oral Daily  . apixaban  5 mg  Oral BID  . diclofenac Sodium  2 g Topical BID  . fenofibrate  160 mg Oral Daily  . furosemide  20 mg Oral Daily  . guaiFENesin  600 mg Oral BID  . levothyroxine  150 mcg Oral QAC breakfast  . pantoprazole  40 mg Oral Daily  . potassium chloride SA  20 mEq Oral Daily  . risperiDONE  0.5 mg Oral q morning - 10a  . risperiDONE  1 mg Oral QHS  . sodium chloride flush  10-40 mL Intracatheter Q12H  . sodium chloride flush  3 mL Intravenous Q12H  . tamsulosin  0.4 mg Oral Daily   Continuous Infusions: PRN Meds:.acetaminophen **OR** acetaminophen, ipratropium-albuterol, ondansetron **OR** ondansetron (ZOFRAN) IV, polyethylene glycol, Resource ThickenUp Clear, senna-docusate, sodium chloride flush Medications Prior to Admission:  Prior to Admission medications   Medication Sig Start Date End Date Taking? Authorizing Provider  acetaminophen (TYLENOL) 500 MG tablet Take 500 mg by mouth every 6 (six) hours as needed for moderate pain.    Yes [provider]  albuterol (PROVENTIL HFA;VENTOLIN HFA) 108 (90 Base) MCG/ACT inhaler Inhale 2 puffs into the lungs every 6 (six) hours as needed for wheezing or shortness of breath. 08/23/18  Yes Alphonzo Grieve, MD  allopurinol (ZYLOPRIM) 100 MG tablet Take 100 mg by mouth daily.  10/28/13  Yes [provider]  amiodarone (PACERONE) 200 MG tablet TAKE 1 TABLET BY MOUTH EVERY DAY Patient taking differently: Take 200 mg by mouth daily.  09/06/18  Yes Evans Lance, MD  diclofenac sodium (VOLTAREN) 1 % GEL Apply  2 g topically 4 (four) times daily. Patient taking differently: Apply 2 g topically 2 (two) times daily. For pain apply to both knees 07/15/18  Yes [name], MD  ELIQUIS 5 MG TABS tablet TAKE 1 TABLET BY MOUTH TWICE A DAY Patient taking differently: Take 5 mg by mouth 2 (two) times daily.  01/16/19  Yes Richardo Priest, MD  fenofibrate 160 MG tablet Take 160 mg by mouth daily.   Yes [provider]  fluticasone (FLONASE) 50  MCG/ACT nasal spray Place 2 sprays into both nostrils daily. 08/24/18  Yes Alphonzo Grieve, MD  furosemide (LASIX) 80 MG tablet TAKE 1 TABLET BY MOUTH EVERY DAY Patient taking differently: Take 80 mg by mouth daily.  01/25/19  Yes Richardo Priest, MD  KLOR-CON M20 20 MEQ tablet Take 20 mEq by mouth daily. 12/04/18  Yes [provider]  pantoprazole (PROTONIX) 40 MG tablet Take 1 tablet (40 mg total) by mouth daily. 08/24/18  Yes Alphonzo Grieve, MD  polyethylene glycol (MIRALAX / GLYCOLAX) packet Take 17 g by mouth daily. 08/24/18  Yes Alphonzo Grieve, MD  risperiDONE (RISPERDAL) 1 MG tablet Take 0.5-1 mg by mouth See admin instructions. Take 1/2 tablet (.5mg ) by mouth every morning, and 1 tablet (1mg ) at bedtime 11/03/18  Yes [provider]  SYNTHROID 150 MCG tablet Take 150 mcg by mouth daily.  01/29/18  Yes [provider]  tamsulosin (FLOMAX) 0.4 MG CAPS capsule Take 1 capsule (0.4 mg total) by mouth daily. 08/24/18  Yes Alphonzo Grieve, MD  vitamin B-12 1000 MCG tablet Take 1 tablet (1,000 mcg total) by mouth daily. 08/24/18  Yes Alphonzo Grieve, MD   Allergies  Allergen Reactions  . Methimazole [Methimazole] Itching and Rash    Severe rash   Review of Systems  Unable to perform ROS: Mental status change    Physical Exam Vitals signs and nursing note reviewed.  Constitutional:      General: He is not in acute distress.    Appearance: He is ill-appearing.  Cardiovascular:     Rate and Rhythm: Normal rate.  Pulmonary:     Effort: Pulmonary effort is normal. No respiratory distress.     Comments: Productive cough Abdominal:     General: There is no distension.  Musculoskeletal:        General: Swelling present.  Skin:    General: Skin is warm and dry.     Comments: BL LE black, think hairless skin   Neurological:     Mental Status: He is alert.  Psychiatric:     Comments: Somewhat fearful, accusatory      Vital Signs: BP (!) 117/55   Pulse (!) 52    Temp 98.7 F (37.1 C) (Oral)   Resp (!) 22   Ht 5\' 6"  (1.676 m)   Wt 90.4 kg   SpO2 95%   BMI 32.17 kg/m  Pain Scale: 0-10   Pain Score: 0-No pain   SpO2: SpO2: 95 % O2 Device:SpO2: 95 % O2 Flow Rate: .O2 Flow Rate (L/min): 2 L/min  IO: Intake/output summary:   Intake/Output Summary (Last 24 hours) at 04/26/2019 1027 Last data filed at 04/26/2019 0452 Gross per 24 hour  Intake 111 ml  Output 100 ml  Net 11 ml    LBM: Last BM Date: (pt unsure) Baseline Weight: Weight: 86.2 kg Most recent weight: Weight: 90.4 kg     Palliative Assessment/Data:   Flowsheet Rows     Most Recent Value  Intake Tab  Referral Department  Hospitalist  Unit at Time of Referral  Med/Surg Unit  Palliative Care Primary Diagnosis  Neurology  Date Notified  04/24/19  Palliative Care Type  New Palliative care  Reason for referral  Clarify Goals of Care  Date of Admission  04/23/19  Date first seen by Palliative Care  04/26/19  # of days Palliative referral response time  2 Day(s)  # of days IP prior to Palliative referral  1  Clinical Assessment  Palliative Performance Scale Score  40%  Pain Max last 24 hours  Not able to report  Pain Min Last 24 hours  Not able to report  Dyspnea Max Last 24 Hours  Not able to report  Dyspnea Min Last 24 hours  Not able to report  Psychosocial & Spiritual Assessment  Palliative Care Outcomes      Time In: 0930 Time Out: 1045 Time Total: 75 minutes  Greater than 50%  of this time was spent counseling and coordinating care related to the above assessment and plan.  Signed by: Drue Novel, NP   Please contact Palliative Medicine Team phone at [phone] for questions and concerns.  For individual provider: See Shea Evans

## 2019-04-26 NOTE — Progress Notes (Signed)
PROGRESS NOTE    [name]  [id number] DOB: 1946-06-01 DOA: 04/23/2019 PCP: Cyndi Bender, PA-C   Brief Narrative:  73 year old with history of paroxysmal atrial flutter on Eliquis, CAD, cardiomyopathy, ventricular tachycardia status post AICD, CKD stage III status post right nephrectomy, OSA, hypothyroidism, gout, arthritis admitted for unresponsiveness.  AICD interrogated which was negative for arrhythmia.  Chest x-ray showed possible developing pneumonia started on Unasyn.  Initial CT head showed infarct versus artifact.  Unable to get MRI due to AICD.   Assessment & Plan:   Principal Problem:   Aspiration pneumonia ([place]) Active Problems:   ICD (implantable cardioverter-defibrillator) in place   Chronic diastolic CHF (congestive heart failure) ([place])   Hypothyroidism   Long term current use of anticoagulant therapy   CAD nonobstructive- 2007   CKD (chronic kidney disease) stage 3, GFR 30-59 ml/min   Fall at home, initial encounter  Unresponsive/acute metabolic encephalopathy, improved -Initial CT head stroke versus artifact, repeat CT head 11/16-no clear acute CVA. -AICD interrogated-unremarkable -PT/OT  Recurrent falls -Unknown exact etiology.  May have some autonomic dysfunction. -Worrisome secondary to chronic anticoagulation -PT/OT-skilled nursing facility  Aspiration pneumonia versus pneumonitis -On IV Unasyn.  Procalcitonin is negative.  Remains afebrile without leukocytosis on room air.  Will discontinue antibiotics.  Encourage use of incentive spirometer  Chronic paroxysmal atrial fibrillation -On Eliquis and amiodarone.  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  Continue to monitor.  Atrioventricular tachycardia status  post AICD -Interrogated during admission  Bilateral knee osteoarthritis -X-ray knees-negative for acute fracture.  Small effusion.  Hyperlipidemia -On fenofibrate  Hypothyroidism -Normal TSH.  Synthroid 150 mcg daily  BPH -Flomax  GERD -PPI  Goals of care discussion -Given his chronic comorbidities, multiple falls especially while on anticoagulation he would benefit from goals of care discussion.  Palliative care has been consulted by previous hospitalist.  DVT prophylaxis: Eliquis Code Status: Full code Family Communication: To be determined Disposition Plan: Placement pending, social worker working with the patient and family.   Subjective: Patient does have any complaints.  He has list of facilities in front of him which he has not picked.  He tells me he wants to discuss this with his cousin [name] and figure it out.  Review of Systems Otherwise negative except as per HPI, including: General: Denies fever, chills, night sweats or unintended weight loss. Resp: Denies cough, wheezing, shortness of breath. Cardiac: Denies chest pain, palpitations, orthopnea, paroxysmal nocturnal dyspnea. GI: Denies abdominal pain, nausea, vomiting, diarrhea or constipation GU: Denies dysuria, frequency, hesitancy or incontinence MS: Denies muscle aches, joint pain or swelling Neuro: Denies headache, neurologic deficits (focal weakness, numbness, tingling), abnormal gait Psych: Denies anxiety, depression, SI/HI/AVH Skin: Denies new rashes or lesions ID: Denies sick contacts, exotic exposures, travel  Objective: Vitals:   04/25/19 2004 04/25/19 2020 04/26/19 0447 04/26/19 0725  BP: (!) 102/53 126/80 (!) 110/48 (!) 117/55  Pulse: 66 64 (!) 52   Resp: (!) 22 18 (!) 22 (!) 22  Temp: 99.2 F (37.3 C)  98.7 F (37.1 C)   TempSrc: Oral  Oral   SpO2: 96% 96% 95%   Weight:   90.4 kg   Height:        Intake/Output Summary (Last 24 hours) at 04/26/2019 0746 Last data filed at 04/26/2019  [id number] Gross  per 24 hour  Intake 333 ml  Output 100 ml  Net 233 ml   Filed Weights   04/24/19 0528 04/25/19 0500 04/26/19 0447  Weight: 87.9 kg 89.2 kg 90.4 kg    Examination:  General exam: Elderly frail Respiratory system: Mild bibasilar rhonchi Cardiovascular system: S1 & S2 heard, RRR. No JVD, murmurs, rubs, gallops or clicks. No pedal edema. Gastrointestinal system: Abdomen is nondistended, soft and nontender. No organomegaly or masses felt. Normal bowel sounds heard. Central nervous system: Alert to name and place.  No focal neuro deficits. Extremities: Symmetric 4 x 5 power. Skin: No rashes, lesions or ulcers Psychiatry: Judgement and insight appear poor mood & affect appropriate.     Data Reviewed:   CBC: Recent Labs  Lab 04/23/19 0915 04/24/19 0630  WBC 10.5 7.1  NEUTROABS 9.2*  --   HGB 12.3* 10.6*  HCT 38.2* 32.8*  MCV 90.1 88.6  PLT 232 [id number]   Basic Metabolic Panel: Recent Labs  Lab 04/23/19 0915 04/24/19 0630  NA 141 142  K 3.7 3.5  CL 106 108  CO2 21* 23  GLUCOSE 108* 105*  BUN 35* 31*  CREATININE 1.53* 1.57*  CALCIUM 9.1 8.6*   GFR: Estimated Creatinine Clearance: 42.8 mL/min (A) (by C-G formula based on SCr of 1.57 mg/dL (H)). Liver Function Tests: Recent Labs  Lab 04/23/19 0915  AST 22  ALT 20  ALKPHOS 42  BILITOT 1.9*  PROT 6.8  ALBUMIN 3.2*   No results for input(s): LIPASE, AMYLASE in the last 168 hours. No results for input(s): AMMONIA in the last 168 hours. Coagulation Profile: No results for input(s): INR, PROTIME in the last 168 hours. Cardiac Enzymes: Recent Labs  Lab 04/23/19 0915  CKTOTAL 237   BNP (last 3 results) Recent Labs    01/05/19 1112  PROBNP 314   HbA1C: No results for input(s): HGBA1C in the last 72 hours. CBG: Recent Labs  Lab 04/24/19 2029 04/25/19 0811 04/25/19 1124 04/25/19 1616 04/25/19 2047  GLUCAP 120* 104* 108* 130* 154*   Lipid Profile: No results for input(s): CHOL, HDL, LDLCALC,  TRIG, CHOLHDL, LDLDIRECT in the last 72 hours. Thyroid Function Tests: Recent Labs    04/25/19 0836  TSH 0.791   Anemia Panel: No results for input(s): VITAMINB12, FOLATE, FERRITIN, TIBC, IRON, RETICCTPCT in the last 72 hours. Sepsis Labs: Recent Labs  Lab 04/23/19 0915 04/23/19 1710 04/24/19 0630 04/25/19 0922  PROCALCITON  --  0.11 0.13 <0.10  LATICACIDVEN 0.9 0.7  --   --     Recent Results (from the past 240 hour(s))  SARS CORONAVIRUS 2 (TAT 6-24 HRS) Nasopharyngeal Nasopharyngeal Swab     Status: None   Collection Time: 04/23/19  5:48 PM   Specimen: Nasopharyngeal Swab  Result Value Ref Range Status   SARS Coronavirus 2 NEGATIVE NEGATIVE Final    Comment: (NOTE) SARS-CoV-2 target nucleic acids are NOT DETECTED. The SARS-CoV-2 RNA is generally detectable in upper and lower respiratory specimens during the acute phase of infection. Negative results do not preclude SARS-CoV-2 infection, do not rule out co-infections with other pathogens, and should not be used as the sole basis for treatment or other patient management decisions. Negative results must be combined with clinical observations, patient history, and epidemiological information. The expected result is Negative. Fact Sheet for Patients: SugarRoll.be Fact Sheet for Healthcare Providers: https://www.woods-mathews.com/ This test is not yet approved or cleared by the [place] FDA and  has been authorized for detection and/or diagnosis of  SARS-CoV-2 by FDA under an Emergency Use Authorization (EUA). This EUA will remain  in effect (meaning this test can be used) for the duration of the COVID-19 declaration under Section 56 4(b)(1) of the Act, 21 U.S.C. section 360bbb-3(b)(1), unless the authorization is terminated or revoked sooner. Performed at [facility], [place] [address], [place], [place] [address]          Radiology Studies: Ct Head Wo  Contrast  Result Date: 04/24/2019 CLINICAL DATA:  Abnormal prior CT. EXAM: CT HEAD WITHOUT CONTRAST TECHNIQUE: Contiguous axial images were obtained from the base of the skull through the vertex without intravenous contrast. COMPARISON:  04/23/2019 FINDINGS: Brain: As on the prior study, the patient has been scanned with the head at an oblique orientation. The hypodensity seen previously in the anterior right temporal lobe and right cerebellum is less prominent on the current study. Diffuse loss of parenchymal volume is consistent with atrophy. Patchy low attenuation in the deep hemispheric and periventricular white matter is nonspecific, but likely reflects chronic microvascular ischemic demyelination. Vascular: No hyperdense vessel or unexpected calcification. Skull: No evidence for fracture. No worrisome lytic or sclerotic lesion. Sinuses/Orbits: Mild chronic mucosal thickening noted maxillary sinuses. Visualized portions of the globes and intraorbital fat are unremarkable. Other: None. IMPRESSION: 1. The hypodensity seen previously in the anterior right temporal lobe and right cerebellum is less prominent on today's study suggesting appearance on prior study was artifactual. 2. Atrophy with chronic small vessel white matter ischemic disease. Electronically Signed   By: Misty Stanley M.D.   On: 04/24/2019 09:04   Dg Knee Left Port  Result Date: 04/25/2019 CLINICAL DATA:  Left knee pain EXAM: PORTABLE LEFT KNEE - 1-2 VIEW COMPARISON:  07/12/2018 FINDINGS: No fracture or dislocation. Advanced arthritis of the medial joint space with narrowing osteophyte and sclerosis. Mild compensatory widening of the lateral joint space with joint space calcification. Moderate patellofemoral degenerative change. Small knee effusion. IMPRESSION: 1. No acute osseous abnormality 2. Arthritis of the knee with small effusion 3. Chondrocalcinosis Electronically Signed   By: Donavan Foil M.D.   On: 04/25/2019 20:12   Dg  Swallowing Func-speech Pathology  Result Date: 04/24/2019 Objective Swallowing Evaluation: Type of Study: MBS-Modified Barium Swallow Study  Patient Details Name: Cody Faulkner MRN: [id number] Date of Birth: 1946/02/21 Today's Date: 04/24/2019 Time: SLP Start Time (ACUTE ONLY): 1150 -SLP Stop Time (ACUTE ONLY): 1210 SLP Time Calculation (min) (ACUTE ONLY): 20 min Past Medical History: Past Medical History: Diagnosis Date  Asthma   Albuterol prn;Symbicort daily  Atrial fibrillation ([place])   takes COumadin daily  Bruises easily   d/t being on Coumadin  CAD (coronary artery disease)   predominantly single vessel  Cardiomyopathy, ischemic   Cataracts, bilateral to be removed in Aug 2015  Chronic kidney disease 1976  S/P nephrectomy  Constipation   takes Colace daily as needed  Depression   Dilated cardiomyopathy ([place])   s/p defibrillator Med[employer]onic EnTrust [id number]  Dizziness   occasionally  GERD (gastroesophageal reflux disease)   takes OTC med if needed  Gout   takes Allopurinol daily  Heart failure ([place])   NYHFA     CLASS 3  History of hyperthyroidism   Hyperlipidemia   takes Fenofibrate daily  Hypertension   takes Bisoprolol daily as well as Imdur  Hypothyroidism   takes Synthroid daily  Joint pain   Joint swelling   Left knee DJD   needs surgery  Myocardial infarction ([place]) 2007  OSA (obstructive sleep  apnea)   "don't wear mask; can't afford one" (12/20/2013)  Pacemaker   Peripheral edema   takes Furosemide daily  Pneumonia, community acquired last time 2014  "get it ~ q yr; haven't had it yet in 2015" (12/20/2013)  Seasonal allergies   takes Claritin daily  Shortness of breath   with exertion  Solitary kidney 06/05/2006  Urinary frequency   Urinary incontinence   Urinary urgency   Ventricular tachycardia ([place])   s/p defibrillator-Medtronic EnTrust D154 Past Surgical History: Past Surgical History: Procedure Laterality Date  CARDIAC CATHETERIZATION  2007  60% Stenosis mid-CFX  CARDIAC  DEFIBRILLATOR PLACEMENT  2007  Medtronic EnTrust Organ REMOVAL  12/20/2013  CARDIOVERSION  06/18/2011  Procedure: CARDIOVERSION;  Surgeon: Kerry Hough., MD;  Location: [facility];  Service: Cardiovascular;  Laterality: N/A;  CARDIOVERSION N/A 12/06/2012  Procedure: CARDIOVERSION;  Surgeon: Jacolyn Reedy, MD;  Location: [facility];  Service: Cardiovascular;  Laterality: N/A;  COLONOSCOPY    ICD GENERATOR CHANGE  07/2012  ICD LEAD REMOVAL Left 12/20/2013  Procedure: ICD LEAD REMOVAL//EXTRACTION AND PLACEMENT OF TEMP/PERM ATRIAL LEAD;  Surgeon: Evans Lance, MD;  Location: [facility];  Service: Cardiovascular;  Laterality: Left;  IMPLANTABLE CARDIOVERTER DEFIBRILLATOR IMPLANT N/A 01/25/2014  Procedure: IMPLANTABLE CARDIOVERTER DEFIBRILLATOR IMPLANT;  Surgeon: Evans Lance, MD;  Location: [facility] CATH LAB;  Service: Cardiovascular;  Laterality: N/A;  IMPLANTABLE CARDIOVERTER DEFIBRILLATOR REVISION N/A 07/13/2012  Procedure: IMPLANTABLE CARDIOVERTER DEFIBRILLATOR REVISION;  Surgeon: Deboraha Sprang, MD;  Location: [facility] C[facility] LAB;  Service: Cardiovascular;  Laterality: N/A;  INSERT / REPLACE / REMOVE PACEMAKER  12/20/2013  KNEE ARTHROSCOPY Left   MULTIPLE TOOTH EXTRACTIONS    "top's are all gone; took some off the bottom too"  NEPHRECTOMY  1976  Right  TEE WITHOUT CARDIOVERSION  07/13/2012  Procedure: TRANSESOPHAGEAL ECHOCARDIOGRAM (TEE);  Surgeon: Lelon Perla, MD;  Location: [facility] ENDOSCOPY;  Service: Cardiovascular;  Laterality: N/A; HPI: 73yo male admitted 04/23/2019 after a fall. PMH: hypertension, paroxysmal atrial fibrillation on Eliquis, CAD, cardiomyopathy, ventricular tachycardia s/p AICD, CKD stage III, s/p right nephrectomy OSA, hypothyroidism, gout, and arthritis  CXR = Interstitial airspace opacities at the lung bases suspicious for developing developing pneumonia or pneumonitis from aspiration.  Subjective: "is this why I'm coughing so much?" Assessment / Plan / Recommendation CHL IP  CLINICAL IMPRESSIONS 04/24/2019 Clinical Impression Pt has a moderate oropharyngeal dysphagia that is possibly more chronic in nature. He has decreased oral control, resulting in anterior and posterior spillage and decreased bolus cohesion. He has intermittent oral residue. His base of tongue retraction is reduced and he has incomplete deflection of his epiglottis - moving downward but not backward. Silent aspiration occurs during the swallow with thin and nectar thick liquids. He seemed to have reduce aspriation events with a head turn to his left, but this did not completely eliminate aspiration from occurring. Honey thick liquids and solids are better contained in the valleculae and therefore are swallowed without aspiration., although residue in the valleculae increases with increased viscosity. Recommend Dys 2 diet and honey thick liquids by single cup sips. 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 It is unclear how this may have fluctuate over this period of time, but pt also reports having had at least four cases of PNA not including this one, although he is unclear of timing.  Given that this may be at least acute on chronic, recommend considering palliative care to address overall GOC moving forward.  SLP Visit Diagnosis Dysphagia, oropharyngeal phase (R13.12) Attention and concentration deficit following -- Frontal lobe and executive function deficit following -- Impact on safety and function Moderate aspiration risk   CHL IP TREATMENT RECOMMENDATION 04/24/2019 Treatment Recommendations Therapy as outlined in treatment plan below   Prognosis 04/24/2019 Prognosis for Safe Diet Advancement Fair Barriers to Reach Goals Time post onset Barriers/Prognosis Comment -- CHL IP DIET RECOMMENDATION 04/24/2019 SLP Diet Recommendations Dysphagia 2 (Fine chop)  solids;Honey thick liquids Liquid Administration via Cup;No straw Medication Administration Crushed with puree Compensations Minimize environmental distractions;Slow rate;Small sips/bites Postural Changes Seated upright at 90 degrees   CHL IP OTHER RECOMMENDATIONS 04/24/2019 Recommended Consults -- Oral Care Recommendations Oral care BID Other Recommendations Order thickener from pharmacy;Prohibited food (jello, ice cream, thin soups);Remove water pitcher   CHL IP FOLLOW UP RECOMMENDATIONS 04/24/2019 Follow up Recommendations (No Data)   CHL IP FREQUENCY AND DURATION 04/24/2019 Speech Therapy Frequency (ACUTE ONLY) min 2x/week Treatment Duration 2 weeks      CHL IP ORAL PHASE 04/24/2019 Oral Phase Impaired Oral - Pudding Teaspoon -- Oral - Pudding Cup -- Oral - Honey Teaspoon -- Oral - Honey Cup Right anterior bolus loss;Weak lingual manipulation;Reduced posterior propulsion;Lingual/palatal residue;Decreased bolus cohesion;Premature spillage Oral - Nectar Teaspoon -- Oral - Nectar Cup Right anterior bolus loss;Weak lingual manipulation;Reduced posterior propulsion;Lingual/palatal residue;Decreased bolus cohesion;Premature spillage Oral - Nectar Straw Right anterior bolus loss;Weak lingual manipulation;Reduced posterior propulsion;Lingual/palatal residue;Decreased bolus cohesion;Premature spillage Oral - Thin Teaspoon -- Oral - Thin Cup Right anterior bolus loss;Weak lingual manipulation;Reduced posterior propulsion;Lingual/palatal residue;Decreased bolus cohesion;Premature spillage Oral - Thin Straw Right anterior bolus loss;Weak lingual manipulation;Reduced posterior propulsion;Lingual/palatal residue;Decreased bolus cohesion;Premature spillage Oral - Puree Right anterior bolus loss;Weak lingual manipulation;Reduced posterior propulsion;Lingual/palatal residue;Decreased bolus cohesion;Premature spillage Oral - Mech Soft Right anterior bolus loss;Weak lingual manipulation;Reduced posterior propulsion;Lingual/palatal  residue;Decreased bolus cohesion;Premature spillage Oral - Regular -- Oral - Multi-Consistency -- Oral - Pill -- Oral Phase - Comment --  CHL IP PHARYNGEAL PHASE 04/24/2019 Pharyngeal Phase Impaired Pharyngeal- Pudding Teaspoon -- Pharyngeal -- Pharyngeal- Pudding Cup -- Pharyngeal -- Pharyngeal- Honey Teaspoon -- Pharyngeal -- Pharyngeal- Honey Cup Reduced tongue base retraction;Reduced epiglottic inversion;Pharyngeal residue - valleculae Pharyngeal -- Pharyngeal- Nectar Teaspoon -- Pharyngeal -- Pharyngeal- Nectar Cup Reduced tongue base retraction;Reduced epiglottic inversion;Pharyngeal residue - valleculae;Penetration/Aspiration during swallow Pharyngeal Material enters airway, passes BELOW cords without attempt by patient to eject out (silent aspiration) Pharyngeal- Nectar Straw Reduced tongue base retraction;Reduced epiglottic inversion;Pharyngeal residue - valleculae;Penetration/Aspiration during swallow Pharyngeal Material enters airway, passes BELOW cords without attempt by patient to eject out (silent aspiration) Pharyngeal- Thin Teaspoon -- Pharyngeal -- Pharyngeal- Thin Cup Reduced tongue base retraction;Reduced epiglottic inversion;Penetration/Aspiration during swallow Pharyngeal Material enters airway, passes BELOW cords without attempt by patient to eject out (silent aspiration) Pharyngeal- Thin Straw Reduced tongue base retraction;Reduced epiglottic inversion;Penetration/Aspiration during swallow Pharyngeal Material enters airway, passes BELOW cords without attempt by patient to eject out (silent aspiration) Pharyngeal- Puree Reduced tongue base retraction;Reduced epiglottic inversion;Pharyngeal residue - valleculae Pharyngeal -- Pharyngeal- Mechanical Soft Reduced tongue base retraction;Reduced epiglottic inversion;Pharyngeal residue - valleculae Pharyngeal -- Pharyngeal- Regular -- Pharyngeal -- Pharyngeal- Multi-consistency -- Pharyngeal -- Pharyngeal- Pill -- Pharyngeal -- Pharyngeal Comment --   CHL IP CERVICAL ESOPHAGEAL PHASE 04/24/2019 Cervical Esophageal Phase WFL Pudding Teaspoon -- Pudding Cup -- Honey Teaspoon -- Honey Cup -- Nectar Teaspoon -- Nectar Cup -- Nectar Straw -- Thin  Teaspoon -- Thin Cup -- Thin Straw -- Puree -- Mechanical Soft -- Regular -- Multi-consistency -- Pill -- Cervical Esophageal Comment -- Venita Sheffield Nix 04/24/2019, 1:27 PM  Pollyann Glen, M.A. CCC-SLP Acute Rehabilitation Services Pager [phone] Office [phone]                  Scheduled Meds:  amiodarone  200 mg Oral Daily   apixaban  5 mg Oral BID   diclofenac Sodium  2 g Topical BID   fenofibrate  160 mg Oral Daily   furosemide  20 mg Oral Daily   guaiFENesin  600 mg Oral BID   levothyroxine  150 mcg Oral QAC breakfast   pantoprazole  40 mg Oral Daily   potassium chloride SA  20 mEq Oral Daily   risperiDONE  0.5 mg Oral q morning - 10a   risperiDONE  1 mg Oral QHS   sodium chloride flush  10-40 mL Intracatheter Q12H   sodium chloride flush  3 mL Intravenous Q12H   tamsulosin  0.4 mg Oral Daily   Continuous Infusions:  ampicillin-sulbactam (UNASYN) IV 3 g (04/26/19 0546)     LOS: 3 days   Time spent= 15 mins    Mehreen Azizi Arsenio Loader, MD [facility]  If 7PM-7AM, please contact night-coverage  04/26/2019, 7:46 AM

## 2019-04-27 DIAGNOSIS — J690 Pneumonitis due to inhalation of food and vomit: Principal | ICD-10-CM

## 2019-04-27 DIAGNOSIS — E039 Hypothyroidism, unspecified: Secondary | ICD-10-CM

## 2019-04-27 DIAGNOSIS — Z7901 Long term (current) use of anticoagulants: Secondary | ICD-10-CM

## 2019-04-27 DIAGNOSIS — I5032 Chronic diastolic (congestive) heart failure: Secondary | ICD-10-CM

## 2019-04-27 DIAGNOSIS — N183 Chronic kidney disease, stage 3 unspecified: Secondary | ICD-10-CM

## 2019-04-27 DIAGNOSIS — I25118 Atherosclerotic heart disease of native coronary artery with other forms of angina pectoris: Secondary | ICD-10-CM

## 2019-04-27 LAB — CBC
HCT: 30 % — ABNORMAL LOW (ref 39.0–52.0)
Hemoglobin: 9.4 g/dL — ABNORMAL LOW (ref 13.0–17.0)
MCH: 28.8 pg (ref 26.0–34.0)
MCHC: 31.3 g/dL (ref 30.0–36.0)
MCV: 92 fL (ref 80.0–100.0)
Platelets: 230 10*3/uL (ref 150–400)
RBC: 3.26 MIL/uL — ABNORMAL LOW (ref 4.22–5.81)
RDW: 12.9 % (ref 11.5–15.5)
WBC: 6.8 10*3/uL (ref 4.0–10.5)
nRBC: 0 % (ref 0.0–0.2)

## 2019-04-27 LAB — COMPREHENSIVE METABOLIC PANEL
ALT: 22 U/L (ref 0–44)
AST: 21 U/L (ref 15–41)
Albumin: 2.1 g/dL — ABNORMAL LOW (ref 3.5–5.0)
Alkaline Phosphatase: 34 U/L — ABNORMAL LOW (ref 38–126)
Anion gap: 6 (ref 5–15)
BUN: 27 mg/dL — ABNORMAL HIGH (ref 8–23)
CO2: 27 mmol/L (ref 22–32)
Calcium: 8.5 mg/dL — ABNORMAL LOW (ref 8.9–10.3)
Chloride: 109 mmol/L (ref 98–111)
Creatinine, Ser: 1.24 mg/dL (ref 0.61–1.24)
GFR calc Af Amer: >60 mL/min (ref >60)
GFR calc non Af Amer: 57 mL/min — ABNORMAL LOW (ref >60)
Glucose, Bld: 101 mg/dL — ABNORMAL HIGH (ref 70–99)
Potassium: 4.2 mmol/L (ref 3.5–5.1)
Sodium: 142 mmol/L (ref 135–145)
Total Bilirubin: 1.1 mg/dL (ref 0.3–1.2)
Total Protein: 5.2 g/dL — ABNORMAL LOW (ref 6.5–8.1)

## 2019-04-27 LAB — GLUCOSE, CAPILLARY
Glucose-Capillary: 94 mg/dL (ref 70–99)
Glucose-Capillary: 112 mg/dL — ABNORMAL HIGH (ref 70–99)
Glucose-Capillary: 112 mg/dL — ABNORMAL HIGH (ref 70–99)

## 2019-04-27 LAB — SARS CORONAVIRUS 2 (TAT 6-24 HRS): SARS Coronavirus 2: NEGATIVE

## 2019-04-27 LAB — MAGNESIUM: Magnesium: 2.2 mg/dL (ref 1.7–2.4)

## 2019-04-27 MED ORDER — PANTOPRAZOLE SODIUM 40 MG PO PACK
40.0000 mg | PACK | Freq: Every day | ORAL | Status: DC
  Administered 2019-04-27: 40 mg via ORAL
  Filled 2019-04-27 (×3): qty 20

## 2019-04-27 MED ORDER — GUAIFENESIN 200 MG PO TABS
200.0000 mg | ORAL_TABLET | ORAL | Status: DC
  Administered 2019-04-27 – 2019-04-28 (×7): 200 mg via ORAL
  Filled 2019-04-27 (×9): qty 1

## 2019-04-27 MED ORDER — FUROSEMIDE 20 MG PO TABS
20.0000 mg | ORAL_TABLET | Freq: Every day | ORAL | Status: DC
  Administered 2019-04-28: 20 mg via ORAL
  Filled 2019-04-27: qty 1

## 2019-04-27 NOTE — Progress Notes (Signed)
  Speech Language Pathology Treatment: Dysphagia  Patient Details Name: Cody Faulkner MRN: [id number] DOB: 07/11/1945 Today's Date: 04/27/2019 Time: [id number] SLP Time Calculation (min) (ACUTE ONLY): 19 min  Assessment / Plan / Recommendation Clinical Impression  Dysphagia treatment focused on observing pt with breakfast (Dy2, honey thickened liquids) for diet tolerance and use of compensatory strategies. Pt tolerate diet very while eating 100% of breakfast meal. Pt's voice quality is hoarse at baseline, but no wet quality noted, no throat clearing or coughing. Pt demonstrated use of small bites/sionglesips and eating slowly with minimal cues. With additional information obtained from family, pt has had a sharp decline in overall health in the last year. Palliative team and family feel pt will need SNF placement for rehab. Recommend pt continue current diet of Dys 2, honey thickened liquids with continue speech therapy at next level of care to address Dysphagia.    HPI HPI: 73yo male admitted 04/23/2019 after a fall. PMH: hypertension, paroxysmal atrial fibrillation on Eliquis, CAD, cardiomyopathy, ventricular tachycardia s/p AICD, CKD stage III, s/p right nephrectomy OSA, hypothyroidism, gout, and arthritis  CXR = Interstitial airspace opacities at the lung bases suspicious for developing developing pneumonia or pneumonitis from aspiration.      SLP Plan  Continue with current plan of care       Recommendations  Diet recommendations: Dysphagia 2 (fine chop);Honey-thick liquid Liquids provided via: Cup Medication Administration: Whole meds with puree Supervision: Intermittent supervision to cue for compensatory strategies Compensations: Minimize environmental distractions;Slow rate;Small sips/bites Postural Changes and/or Swallow Maneuvers: Seated upright 90 degrees;Upright 30-60 min after meal                Oral Care Recommendations: Oral care BID Follow up Recommendations:  Skilled Nursing facility SLP Visit Diagnosis: Dysphagia, oropharyngeal phase (R13.12) Plan: Continue with current plan of care       GO                Wynelle Bourgeois., MA, CCC-SLP 04/27/2019, 10:44 AM

## 2019-04-27 NOTE — Progress Notes (Signed)
Physical Therapy Treatment Patient Details Name: Cody Faulkner MRN: [id number] DOB: 1946-05-30 Today's Date: 04/27/2019    History of Present Illness 73 y.o. male with medical history significant of hypertension, paroxysmal atrial fibrillation on Eliquis, CAD, cardiomyopathy, ventricular tachycardia s/p AICD, CKD stage III, s/p right nephrectomy OSA, hypothyroidism, gout, and arthritis.  He presents after being found face down at home.    PT Comments    Pt in bed upon PT arrival, agreeable to PT session. Pt was able to demo sig improvements in assist needed for bed mobility and was able to demo transfer today with use of stedy and maxA of 2. Pt was able to complete bed mobility with modA of 2 and maintain sitting EOB with supervision only. Pt was able to stand from EOB x2 with stedy and benefits from increased tactile cues for hip extension and posture. Pt will continue to benefit from skilled PT to address functional limitation and independence.     Follow Up Recommendations  SNF     Equipment Recommendations  None recommended by PT    Recommendations for Other Services       Precautions / Restrictions Precautions Precautions: Fall Restrictions Weight Bearing Restrictions: No    Mobility  Bed Mobility Overal bed mobility: Needs Assistance Bed Mobility: Supine to Sit;Sit to Supine     Supine to sit: +2 for physical assistance;Mod assist Sit to supine: +2 for physical assistance;Max assist   General bed mobility comments: pt needs modA with increased time for LE movement and modA with elevated HOB and use of bed rails. Pt able to initiate with extra time. maxA to return to supine with some assist from pt, totalA to scoot.  Transfers Overall transfer level: Needs assistance   Transfers: Sit to/from Stand Sit to Stand: Max assist;+2 physical assistance         General transfer comment: maxA of 2 with use of stedy, tactile cues at hip for hip extension, VCs for posture,  pt able to stand for ~1 min x2  Ambulation/Gait                 Stairs             Wheelchair Mobility    Modified Rankin (Stroke Patients Only)       Balance Overall balance assessment: Needs assistance Sitting-balance support: Feet supported;No upper extremity supported Sitting balance-[name]: Fair Sitting balance - Comments: pt able to sit without phsyical assist with no UE support, minor post lean with elevation of feet.     Standing balance-[name]: Poor Standing balance comment: total support maxA of 2 with stedy                            Cognition Arousal/Alertness: Awake/alert Behavior During Therapy: Flat affect;WFL for tasks assessed/performed Overall Cognitive Status: No family/caregiver present to determine baseline cognitive functioning                                 General Comments: Pt agreeable to PT session but benefits from increased processing time through session both when answering questions and completing tasks/movements. Pt able to verbalize most needs when given extra time, not able to answer what button to call for RN.      Exercises      General Comments        Pertinent Vitals/Pain Pain Assessment: No/denies  pain Pain Score: 0-No pain Pain Intervention(s): Monitored during session    Home Living                      Prior Function            PT Goals (current goals can now be found in the care plan section) Acute Rehab PT Goals Patient Stated Goal: to get stronger PT Goal Formulation: With patient Time For Goal Achievement: 05/15/19 Potential to Achieve Goals: Fair Progress towards PT goals: Progressing toward goals    Frequency    Min 2X/week      PT Plan Current plan remains appropriate    Co-evaluation              AM-PAC PT "6 Clicks" Mobility   Outcome Measure  Help needed turning from your back to your side while in a flat bed without using  bedrails?: A Lot Help needed moving from lying on your back to sitting on the side of a flat bed without using bedrails?: A Lot Help needed moving to and from a bed to a chair (including a wheelchair)?: Total Help needed standing up from a chair using your arms (e.g., wheelchair or bedside chair)?: A Lot Help needed to walk in hospital room?: Total Help needed climbing 3-5 steps with a railing? : Total 6 Click Score: 9    End of Session Equipment Utilized During Treatment: Gait belt Activity Tolerance: Patient limited by fatigue Patient left: in bed;with call bell/phone within reach;with bed alarm set(no recliner in pt room, in bed in chair position) Nurse Communication: Mobility status PT Visit Diagnosis: Muscle weakness (generalized) (M62.81);Difficulty in walking, not elsewhere classified (R26.2);Pain;History of falling (Z91.81);Dizziness and giddiness (R42) Pain - Right/Left: Left Pain - part of body: Knee     Time: 1200-1224 PT Time Calculation (min) (ACUTE ONLY): 24 min  Charges:  $Therapeutic Activity: 23-37 mins                     Cody Faulkner, PT, DPT   Acute Rehabilitation Department [phone]   Cody Faulkner 04/27/2019, 1:00 PM

## 2019-04-27 NOTE — Progress Notes (Signed)
PROGRESS NOTE    SEABRON DILLIN  [id number] DOB: Dec 11, 1945 DOA: 04/23/2019 PCP: Cyndi Bender, PA-C   Brief Narrative:  73 year old with history of paroxysmal atrial flutter on Eliquis, CAD, cardiomyopathy, ventricular tachycardia status post AICD, CKD stage III status post right nephrectomy, OSA, hypothyroidism, gout, arthritis admitted for unresponsiveness.  AICD interrogated which was negative for arrhythmia.  Chest x-ray showed possible developing pneumonia started on Unasyn.  Initial CT head showed infarct versus artifact.  Unable to get MRI due to AICD. ------------------------------------------------------------------------------------------------------------------------------------------------------------   Subjective:  The patient was seen and examined this morning, with no acute distress, laying comfortably in bed, following commands. The patient and the family has not yet determined which facility that he needs to go to. He was mildly hypotensive with a blood pressure of 92/48 with a heart rate of 45 this morning but alert awake. Denies any chest pain or shortness of breath    ====================================================================================== Assessment & Plan:   Principal Problem:   Aspiration pneumonia ([place]) Active Problems:   ICD (implantable cardioverter-defibrillator) in place   Chronic diastolic CHF (congestive heart failure) ([place])   Hypothyroidism   Long term current use of anticoagulant therapy   CAD nonobstructive- 2007   CKD (chronic kidney disease) stage 3, GFR 30-59 ml/min   Fall at home, initial encounter   Goals of care, counseling/discussion   Palliative care by specialist   DNR (do not resuscitate) discussion  Unresponsive/acute metabolic encephalopathy, improved -Mentation is improved, back to baseline -Initial CT head stroke versus artifact, repeat CT head 11/16-no clear acute CVA. -AICD  interrogated-unremarkable -PT/OT  Recurrent falls /debility --Needs assist with ADLs and ambulation -Unknown exact etiology.  May have some autonomic dysfunction. -Worrisome secondary to chronic anticoagulation -PT/OT-skilled nursing facility -Fall precaution  Aspiration pneumonia versus pneumonitis -Remained stable hemodynamically mildly hypotensive, afebrile -On IV Unasyn.  Procalcitonin is negative.  -Antibiotics has been DC'd -We will continue to encourage use of incentive spirometer  Chronic paroxysmal atrial fibrillation -On Eliquis and amiodarone.  Chronic dysphagia -Speech recommends dysphagia 2 diet  Essential hypertension sCHF- preserved EJ 60%, G1DD -on 80 mg Lasix at home,  CKD stage IIIa status post right nephrectomy -Creatinine at baseline.  Continue to monitor.  Atrioventricular tachycardia status post AICD -Interrogated during admission  Bilateral knee osteoarthritis -X-ray knees-negative for acute fracture.  Small effusion.  Hyperlipidemia -On fenofibrate  Hypothyroidism -Normal TSH.  Synthroid 150 mcg daily  BPH -Flomax  GERD -PPI  Goals of care discussion -Given his chronic comorbidities, multiple falls especially while on anticoagulation he would benefit from goals of care discussion.  Palliative care has been consulted by previous hospitalist.  DVT prophylaxis: Eliquis Code Status: Full code Family Communication: To be determined Disposition Plan: Placement pending, social worker working with the patient and family. Patient is tolerating from chronic: Rarely, debility, needing SNF placement pending patient and family decision   Objective: Vitals:   04/26/19 1937 04/26/19 2346 04/27/19 0355 04/27/19 1227  BP: 98/74 (!) 109/55 (!) 92/48   Pulse: (!) 57 (!) 48 (!) 45   Resp: 17 (!) 24 19   Temp: 98.3 F (36.8 C)  98.2 F (36.8 C)   TempSrc: Oral  Oral   SpO2: 100% 97% 98% 98%  Weight:   90.2 kg   Height:        Intake/Output  Summary (Last 24 hours) at 04/27/2019 1258 Last data filed at 04/27/2019 0500 Gross per 24 hour  Intake 600 ml  Output 1825 ml  Net -1225 ml  Filed Weights   04/25/19 0500 04/26/19 0447 04/27/19 0355  Weight: 89.2 kg 90.4 kg 90.2 kg    Examination: BP (!) 92/48    Pulse (!) 45    Temp 98.2 F (36.8 C) (Oral)    Resp 19    Ht 5\' 6"  (1.676 m)    Wt 90.2 kg    SpO2 98%    BMI 32.10 kg/m    Physical Exam  Constitution:  Alert, cooperative, no distress,  Psychiatric: Poor insight and judgment, mood stable HEENT: Normocephalic, PERRL, otherwise with in Normal limits  Chest:Chest symmetric Cardio vascular:  S1/S2, RRR, No murmure, No Rubs or Gallops  pulmonary: Clear to auscultation bilaterally, respirations unlabored, negative wheezes / crackles Abdomen: Soft, non-tender, non-distended, bowel sounds,no masses, no organomegaly Muscular skeletal: Limited exam - in bed, able to move all 4 extremities, Normal strength,  Neuro: CNII-XII intact. , normal motor and sensation, reflexes intact  Extremities:  Severe generalized weakness is noted, +1pitting edema lower extremities, +2 pulses  Skin: Dry, warm to touch, negative for any Rashes, No open wounds Wounds: per nursing documentation      Data Reviewed:   CBC: Recent Labs  Lab 04/23/19 0915 04/24/19 0630 04/27/19 0407  WBC 10.5 7.1 6.8  NEUTROABS 9.2*  --   --   HGB 12.3* 10.6* 9.4*  HCT 38.2* 32.8* 30.0*  MCV 90.1 88.6 92.0  PLT 232 236 [id number]   Basic Metabolic Panel: Recent Labs  Lab 04/23/19 0915 04/24/19 0630 04/27/19 0407  NA 141 142 142  K 3.7 3.5 4.2  CL 106 108 109  CO2 21* 23 27  GLUCOSE 108* 105* 101*  BUN 35* 31* 27*  CREATININE 1.53* 1.57* 1.24  CALCIUM 9.1 8.6* 8.5*  MG  --   --  2.2   GFR: Estimated Creatinine Clearance: 54.2 mL/min (by C-G formula based on SCr of 1.24 mg/dL). Liver Function Tests: Recent Labs  Lab 04/23/19 0915 04/27/19 0407  AST 22 21  ALT 20 22  ALKPHOS 42 34*  BILITOT  1.9* 1.1  PROT 6.8 5.2*  ALBUMIN 3.2* 2.1*   No results for input(s): LIPASE, AMYLASE in the last 168 hours. No results for input(s): AMMONIA in the last 168 hours. Coagulation Profile: No results for input(s): INR, PROTIME in the last 168 hours. Cardiac Enzymes: Recent Labs  Lab 04/23/19 0915  CKTOTAL 237   BNP (last 3 results) Recent Labs    01/05/19 1112  PROBNP 314   HbA1C: No results for input(s): HGBA1C in the last 72 hours. CBG: Recent Labs  Lab 04/26/19 1103 04/26/19 1609 04/26/19 2035 04/27/19 0733 04/27/19 1103  GLUCAP 128* 107* 126* 94 112*   Lipid Profile: No results for input(s): CHOL, HDL, LDLCALC, TRIG, CHOLHDL, LDLDIRECT in the last 72 hours. Thyroid Function Tests: Recent Labs    04/25/19 0836  TSH 0.791   Anemia Panel: No results for input(s): VITAMINB12, FOLATE, FERRITIN, TIBC, IRON, RETICCTPCT in the last 72 hours. Sepsis Labs: Recent Labs  Lab 04/23/19 0915 04/23/19 1710 04/24/19 0630 04/25/19 0922  PROCALCITON  --  0.11 0.13 <0.10  LATICACIDVEN 0.9 0.7  --   --     Recent Results (from the past 240 hour(s))  SARS CORONAVIRUS 2 (TAT 6-24 HRS) Nasopharyngeal Nasopharyngeal Swab     Status: None   Collection Time: 04/23/19  5:48 PM   Specimen: Nasopharyngeal Swab  Result Value Ref Range Status   SARS Coronavirus 2 NEGATIVE NEGATIVE Final    Comment: (NOTE)  SARS-CoV-2 target nucleic acids are NOT DETECTED. The SARS-CoV-2 RNA is generally detectable in upper and lower respiratory specimens during the acute phase of infection. Negative results do not preclude SARS-CoV-2 infection, do not rule out co-infections with other pathogens, and should not be used as the sole basis for treatment or other patient management decisions. Negative results must be combined with clinical observations, patient history, and epidemiological information. The expected result is Negative. Fact Sheet for  Patients: SugarRoll.be Fact Sheet for Healthcare Providers: https://www.woods-mathews.com/ This test is not yet approved or cleared by the [place] FDA and  has been authorized for detection and/or diagnosis of SARS-CoV-2 by FDA under an Emergency Use Authorization (EUA). This EUA will remain  in effect (meaning this test can be used) for the duration of the COVID-19 declaration under Section 56 4(b)(1) of the Act, 21 U.S.C. section 360bbb-3(b)(1), unless the authorization is terminated or revoked sooner. Performed at [facility], [place] [address], [place], [place] [address]          Radiology Studies: Dg Knee Left Port  Result Date: 04/25/2019 CLINICAL DATA:  Left knee pain EXAM: PORTABLE LEFT KNEE - 1-2 VIEW COMPARISON:  07/12/2018 FINDINGS: No fracture or dislocation. Advanced arthritis of the medial joint space with narrowing osteophyte and sclerosis. Mild compensatory widening of the lateral joint space with joint space calcification. Moderate patellofemoral degenerative change. Small knee effusion. IMPRESSION: 1. No acute osseous abnormality 2. Arthritis of the knee with small effusion 3. Chondrocalcinosis Electronically Signed   By: Donavan Foil M.D.   On: 04/25/2019 20:12        Scheduled Meds:  amiodarone  200 mg Oral Daily   apixaban  5 mg Oral BID   diclofenac Sodium  2 g Topical BID   fenofibrate  160 mg Oral Daily   [START ON 04/28/2019] furosemide  20 mg Oral Daily   guaiFENesin  200 mg Oral Q4H   levothyroxine  150 mcg Oral QAC breakfast   pantoprazole sodium  40 mg Oral Daily   potassium chloride SA  20 mEq Oral Daily   risperiDONE  0.5 mg Oral q morning - 10a   risperiDONE  1 mg Oral QHS   sodium chloride flush  10-40 mL Intracatheter Q12H   tamsulosin  0.4 mg Oral Daily   Continuous Infusions:    LOS: 4 days   Time spent= 15 mins    Deatra James, MD [facility]  If  7PM-7AM, please contact night-coverage  04/27/2019, 12:58 PM

## 2019-04-27 NOTE — TOC Progression Note (Signed)
Transition of Care [facility]) - Progression Note    Patient Details  Name: Cody Faulkner MRN: [id number] Date of Birth: 1946-02-13  Transition of Care [facility]) CM/SW Contact  Graves-Bigelow, Ocie Cornfield, RN Phone Number: 04/27/2019, 4:32 PM  Clinical Narrative:  Patient has been declined at [facility], and [facility] [place]. CM did call Admissions Coordinator at [facility] and they have accepted the patient. Family wants the patient to be placed long-term at this point. CM is awaiting call back from Admissions Coordinator to see if the patient will have long-term bed. CM will ask for COVID at this time. Will call [employer] for authorization.     Expected Discharge Plan: [place] Barriers to Discharge: Continued Medical Work up  Expected Discharge Plan and Services Expected Discharge Plan: [place] In-house Referral: NA Discharge Planning Services: CM Consult Post Acute Care Choice: [place] Living arrangements for the past 2 months: Single Family Home                    Readmission Risk Interventions Readmission Risk Prevention Plan 04/25/2019  Transportation Screening Complete  HRI or Home Care Consult Complete  Social Work Consult for [place] Planning/Counseling Complete  Palliative Care Screening Not Applicable  Medication Review [employer]) Complete  Some recent data might be hidden

## 2019-04-27 NOTE — Care Management Important Message (Signed)
Important Message  Patient Details  Name: Cody Faulkner MRN: [id number] Date of Birth: [birth year]   Medicare Important Message Given:  Yes     Shelda Altes 04/27/2019, 2:25 PM

## 2019-04-28 LAB — COMPREHENSIVE METABOLIC PANEL
ALT: 29 U/L (ref 0–44)
AST: 29 U/L (ref 15–41)
Albumin: 2.2 g/dL — ABNORMAL LOW (ref 3.5–5.0)
Alkaline Phosphatase: 37 U/L — ABNORMAL LOW (ref 38–126)
Anion gap: 8 (ref 5–15)
BUN: 31 mg/dL — ABNORMAL HIGH (ref 8–23)
CO2: 25 mmol/L (ref 22–32)
Calcium: 8.6 mg/dL — ABNORMAL LOW (ref 8.9–10.3)
Chloride: 108 mmol/L (ref 98–111)
Creatinine, Ser: 1.34 mg/dL — ABNORMAL HIGH (ref 0.61–1.24)
GFR calc Af Amer: 60 mL/min — ABNORMAL LOW (ref >60)
GFR calc non Af Amer: 51 mL/min — ABNORMAL LOW (ref >60)
Glucose, Bld: 102 mg/dL — ABNORMAL HIGH (ref 70–99)
Potassium: 4.1 mmol/L (ref 3.5–5.1)
Sodium: 141 mmol/L (ref 135–145)
Total Bilirubin: 0.9 mg/dL (ref 0.3–1.2)
Total Protein: 5.3 g/dL — ABNORMAL LOW (ref 6.5–8.1)

## 2019-04-28 LAB — CBC
HCT: 31.6 % — ABNORMAL LOW (ref 39.0–52.0)
Hemoglobin: 10 g/dL — ABNORMAL LOW (ref 13.0–17.0)
MCH: 28.7 pg (ref 26.0–34.0)
MCHC: 31.6 g/dL (ref 30.0–36.0)
MCV: 90.5 fL (ref 80.0–100.0)
Platelets: 258 10*3/uL (ref 150–400)
RBC: 3.49 MIL/uL — ABNORMAL LOW (ref 4.22–5.81)
RDW: 12.7 % (ref 11.5–15.5)
WBC: 5.8 10*3/uL (ref 4.0–10.5)
nRBC: 0 % (ref 0.0–0.2)

## 2019-04-28 LAB — MAGNESIUM: Magnesium: 2.4 mg/dL (ref 1.7–2.4)

## 2019-04-28 MED ORDER — BACITRACIN-POLYMYXIN B 500-10000 UNIT/GM OP OINT: TOPICAL_OINTMENT | Freq: Three times a day (TID) | OPHTHALMIC | 0 refills | Status: AC

## 2019-04-28 MED ORDER — FUROSEMIDE 20 MG PO TABS: 20.0000 mg | ORAL_TABLET | Freq: Every day | ORAL | 1 refills | Status: AC

## 2019-04-28 MED ORDER — DICLOFENAC SODIUM 1 % EX GEL: 2.0000 g | Freq: Two times a day (BID) | CUTANEOUS | Status: AC

## 2019-04-28 MED ORDER — SENNOSIDES-DOCUSATE SODIUM 8.6-50 MG PO TABS: 2.0000 | ORAL_TABLET | Freq: Every evening | ORAL | Status: AC | PRN

## 2019-04-28 MED ORDER — RISPERIDONE 1 MG PO TABS: 1.0000 mg | ORAL_TABLET | Freq: Every day | ORAL | Status: AC

## 2019-04-28 MED ORDER — LEVOTHYROXINE SODIUM 150 MCG PO TABS: 150.0000 ug | ORAL_TABLET | Freq: Every day | ORAL | Status: AC

## 2019-04-28 MED ORDER — POLYETHYLENE GLYCOL 3350 17 G PO PACK: 17.0000 g | PACK | Freq: Every day | ORAL | 0 refills | Status: AC | PRN

## 2019-04-28 MED ORDER — BACITRACIN-POLYMYXIN B 500-10000 UNIT/GM OP OINT
TOPICAL_OINTMENT | Freq: Three times a day (TID) | OPHTHALMIC | Status: DC
  Filled 2019-04-28: qty 3.5

## 2019-04-28 MED ORDER — PANTOPRAZOLE SODIUM 40 MG PO PACK: 40.0000 mg | PACK | Freq: Every day | ORAL | Status: AC

## 2019-04-28 MED ORDER — RISPERIDONE 0.5 MG PO TABS: 0.5000 mg | ORAL_TABLET | Freq: Every morning | ORAL | Status: AC

## 2019-04-28 MED ORDER — GUAIFENESIN 200 MG PO TABS: 200.0000 mg | ORAL_TABLET | ORAL | 0 refills | Status: AC

## 2019-04-28 NOTE — Discharge Summary (Addendum)
Physician Discharge Summary [facility]    Patient: Cody Faulkner                   Admit date: 04/23/2019   DOB: 05/08/1946             Discharge date:04/28/2019/2:12 PM [id number]                          PCP: Cyndi Bender, PA-C  Disposition: SNF  Recommendations for Outpatient Follow-up:    Follow up: in 2 weeks  Discharge Condition: Stable   Code Status:   Code Status: DNR  Diet recommendation: Cardiac diet   Discharge Diagnoses:    Principal Problem:   Aspiration pneumonia ([place]) Active Problems:   ICD (implantable cardioverter-defibrillator) in place   Chronic diastolic CHF (congestive heart failure) ([place])   Hypothyroidism   Long term current use of anticoagulant therapy   CAD nonobstructive- 2007   CKD (chronic kidney disease) stage 3, GFR 30-59 ml/min   Fall at home, initial encounter   Goals of care, counseling/discussion   Palliative care by specialist   DNR (do not resuscitate) discussion   History of Present Illness/ Hospital Course Cody Faulkner Summary:    Brief Narrative:  73 year old with history of paroxysmal atrial flutter on Eliquis, CAD, cardiomyopathy, ventricular tachycardia status post AICD, CKD stage III status post right nephrectomy, OSA, hypothyroidism, gout, arthritis admitted for unresponsiveness.  AICD interrogated which was negative for arrhythmia.  Chest x-ray showed possible developing pneumonia started on Unasyn.  Initial CT head showed infarct versus artifact.  Unable to get MRI due to AICD.  04/27/2019 -patient remained stable, continue to have challenging disposition.,  Patient was mildly hypotensive, bradycardic.  Otherwise remained stable  04/28/2019 -continue to pursue safe discharge planning -patient's heart rate and blood pressure has improved Remains severely deconditioned debilitated and weak --mentation seems to be at baseline  now.  ====================================================================================== Detailed discharge summary/A &P:   Principal Problem:   Aspiration pneumonia ([place]) Active Problems:   ICD (implantable cardioverter-defibrillator) in place   Chronic diastolic CHF (congestive heart failure) ([place])   Hypothyroidism   Long term current use of anticoagulant therapy   CAD nonobstructive- 2007   CKD (chronic kidney disease) stage 3, GFR 30-59 ml/min   Fall at home, initial encounter   Goals of care, counseling/discussion   Palliative care by specialist   DNR (do not resuscitate) discussion  Unresponsive/acute metabolic encephalopathy, improved -Mentation has improved, back to baseline -Initial CT head stroke versus artifact, repeat CT head 11/16-no clear acute CVA. -AICD interrogated-unremarkable -PT/OT  Recurrent falls /debility -Remained severely debilitated eats assist with all ADL, ambulation -Unknown exact etiology.  May have some autonomic dysfunction. -Worrisome secondary to chronic anticoagulation -PT/OT-skilled nursing facility -Fall precaution  Aspiration pneumonia versus pneumonitis -Afebrile, normotensive, hemodynamically stable -No blood cultures were obtained on this admission, sputum analysis pending SARS-CoV-2 x2 - -On IV Unasyn.  Procalcitonin is negative.  -Antibiotics has been DC'd -We will continue to encourage use of incentive spirometer  Chronic paroxysmal atrial fibrillation -Stable rate controlled -On Eliquis -Amiodarone DC'd as patient was having bradycardia -Considering restarting if recurrent A. Fib, tachyarrhythmia.  Chronic dysphagia -Speech recommends dysphagia 2 diet -Tolerating p.o.  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   -Monitor BMP creatinine closely  Atrioventricular tachycardia status post AICD -Interrogated during admission  Bilateral knee osteoarthritis -X-ray knees-negative for acute fracture.  Small effusion.  Hyperlipidemia -On fenofibrate  Hypothyroidism -Normal TSH.  Synthroid 150 mcg daily  BPH -Flomax  GERD -PPI  Goals of care discussion -Given his chronic comorbidities, multiple falls especially while on anticoagulation he would benefit from goals of care discussion.   Palliative care has been consulted by previous hospitalist.    As noted above with multiple comorbidities deconditioning debility we are highly recommending SNF placement   DVT prophylaxis:  on Eliquis  Code Status:  DNR/DNI Family Communication: Palliative care was consulted had extensive discussion with the patient and family, family has agreed to DNR/DNI status.  Palliative care recommended follow-up as an outpatient. Family is agreeable to above plan.  Disposition Plan: Skilled nursing facility.       Discharge Instructions:   Discharge Instructions    (HEART FAILURE PATIENTS) Call MD:  Anytime you have any of the following symptoms: 1) 3 pound weight gain in 24 hours or 5 pounds in 1 week 2) shortness of breath, with or without a dry hacking cough 3) swelling in the hands, feet or stomach 4) if you have to sleep on extra pillows at night in order to breathe.   Complete by: As directed    Activity as tolerated - No restrictions   Complete by: As directed    Diet - low sodium heart healthy   Complete by: As directed    Discharge instructions   Complete by: As directed    Follow-up with palliative as an outpatient, at SNF   Increase activity slowly   Complete by: As directed        Medication List    STOP taking these medications   allopurinol 100 MG tablet Commonly known as: ZYLOPRIM   amiodarone  200 MG tablet Commonly known as: PACERONE   diclofenac sodium 1 % Gel Commonly known as: VOLTAREN Replaced by: diclofenac Sodium 1 % Gel   fenofibrate 160 MG tablet   Klor-Con M20 20 MEQ tablet Generic drug: potassium chloride SA   pantoprazole 40 MG tablet Commonly known as: PROTONIX Replaced by: pantoprazole sodium 40 mg/20 mL Pack     TAKE these medications   acetaminophen 325 MG tablet Commonly known as: TYLENOL Take 2 tablets (650 mg total) by mouth every 6 (six) hours as needed for mild pain (or Fever >/= 101). What changed:   medication strength  how much to take  reasons to take this   albuterol 108 (90 Base) MCG/ACT inhaler Commonly known as: VENTOLIN HFA Inhale 2 puffs into the lungs every 6 (six) hours as needed for wheezing or shortness of breath.   bacitracin-polymyxin b ophthalmic ointment Commonly known as: POLYSPORIN Place into both eyes 3 (three) times daily. apply to eye every 12 hours while awake   cyanocobalamin 1000 MCG tablet Take 1 tablet (1,000 mcg total) by mouth daily.   diclofenac Sodium 1 % Gel Commonly known as: VOLTAREN Apply 2 g topically 2 (two) times daily. Replaces: diclofenac sodium 1 % Gel   Eliquis 5 MG Tabs tablet Generic drug: apixaban TAKE 1 TABLET BY MOUTH TWICE A DAY What changed: how much to take   fluticasone 50 MCG/ACT nasal spray Commonly known as: FLONASE Place 2 sprays into both nostrils daily.   furosemide 20 MG tablet Commonly known as: LASIX Take 1  tablet (20 mg total) by mouth daily. Start taking on: April 29, 2019 What changed:   medication strength  how much to take   guaiFENesin 200 MG tablet Take 1 tablet (200 mg total) by mouth every 4 (four) hours.   levothyroxine 150 MCG tablet Commonly known as: SYNTHROID Take 1 tablet (150 mcg total) by mouth daily before breakfast. Start taking on: April 29, 2019 What changed: when to take this   pantoprazole sodium 40 mg/20 mL Pack Commonly  known as: PROTONIX Take 20 mLs (40 mg total) by mouth daily. Replaces: pantoprazole 40 MG tablet   polyethylene glycol 17 g packet Commonly known as: MIRALAX / GLYCOLAX Take 17 g by mouth daily as needed for moderate constipation or severe constipation. What changed:   when to take this  reasons to take this   risperiDONE 1 MG tablet Commonly known as: RISPERDAL Take 1 tablet (1 mg total) by mouth at bedtime. What changed: You were already taking a medication with the same name, and this prescription was added. Make sure you understand how and when to take each.   risperiDONE 0.5 MG tablet Commonly known as: RISPERDAL Take 1 tablet (0.5 mg total) by mouth every morning. Start taking on: April 29, 2019 What changed:   medication strength  how much to take  when to take this  additional instructions   senna-docusate 8.6-50 MG tablet Commonly known as: Senokot-S Take 2 tablets by mouth at bedtime as needed for mild constipation or moderate constipation.   tamsulosin 0.4 MG Caps capsule Commonly known as: FLOMAX Take 1 capsule (0.4 mg total) by mouth daily.       Allergies  Allergen Reactions   Methimazole [Methimazole] Itching and Rash    Severe rash     Procedures /Studies:   Ct Head Wo Contrast  Result Date: 04/24/2019 CLINICAL DATA:  Abnormal prior CT. EXAM: CT HEAD WITHOUT CONTRAST TECHNIQUE: Contiguous axial images were obtained from the base of the skull through the vertex without intravenous contrast. COMPARISON:  04/23/2019 FINDINGS: Brain: As on the prior study, the patient has been scanned with the head at an oblique orientation. The hypodensity seen previously in the anterior right temporal lobe and right cerebellum is less prominent on the current study. Diffuse loss of parenchymal volume is consistent with atrophy. Patchy low attenuation in the deep hemispheric and periventricular white matter is nonspecific, but likely reflects chronic  microvascular ischemic demyelination. Vascular: No hyperdense vessel or unexpected calcification. Skull: No evidence for fracture. No worrisome lytic or sclerotic lesion. Sinuses/Orbits: Mild chronic mucosal thickening noted maxillary sinuses. Visualized portions of the globes and intraorbital fat are unremarkable. Other: None. IMPRESSION: 1. The hypodensity seen previously in the anterior right temporal lobe and right cerebellum is less prominent on today's study suggesting appearance on prior study was artifactual. 2. Atrophy with chronic small vessel white matter ischemic disease. Electronically Signed   By: Misty Stanley M.D.   On: 04/24/2019 09:04   Ct Head Wo Contrast  Result Date: 04/23/2019 CLINICAL DATA:  Neck pain and left facial bruising following a fall last night. EXAM: CT HEAD WITHOUT CONTRAST CT CERVICAL SPINE WITHOUT CONTRAST TECHNIQUE: Multidetector CT imaging of the head and cervical spine was performed following the standard protocol without intravenous contrast. Multiplanar CT image reconstructions of the cervical spine were also generated. COMPARISON:  07/08/2018. FINDINGS: CT HEAD FINDINGS Brain: Interval low density in the right cerebellum and ill-defined low density in the right temporal lobe. It is difficult to  determine much of this is due to photon starvation at the skull base with the patient's head tilted Stable mildly enlarged ventricles and cortical sulci. No intracranial hemorrhage or mass lesion. Vascular: No hyperdense vessel or unexpected calcification. Skull: No skull fracture. Sinuses/Orbits: Mild mucosal thickening involving all of the paranasal sinuses. Status post bilateral cataract extraction. Other: None. CT CERVICAL SPINE FINDINGS Alignment: Stable grade 1 retrolisthesis at the C5-6 level. Mild dextroconvex cervical scoliosis due to the patient's head tilted to the left, unchanged. Skull base and vertebrae: No acute fracture. No primary bone lesion or focal pathologic  process. Soft tissues and spinal canal: No prevertebral fluid or swelling. No visible canal hematoma. Disc levels:  Multilevel degenerative changes. Upper chest: The included portions of the upper lobes are clear. Other: Bilateral carotid artery atheromatous calcifications. IMPRESSION: 1. 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 This could all be artifactual due to photon starvation related to the patient's head tilted in the gantry. However, acute infarction cannot be excluded in either area. If this is a clinical concern, recommend further evaluation with MRI of the brain. 2. No skull fracture or intracranial hemorrhage. 3. No cervical spine fracture or traumatic subluxation. 4. Stable mild diffuse cerebral and cerebellar atrophy. 5. Multilevel cervical spine degenerative changes. 6. Mild chronic pansinusitis. 7. Bilateral carotid artery atheromatous calcifications. Electronically Signed   By: Claudie Revering M.D.   On: 04/23/2019 10:34   Ct Cervical Spine Wo Contrast  Result Date: 04/23/2019 CLINICAL DATA:  Neck pain and left facial bruising following a fall last night. EXAM: CT HEAD WITHOUT CONTRAST CT CERVICAL SPINE WITHOUT CONTRAST TECHNIQUE: Multidetector CT imaging of the head and cervical spine was performed following the standard protocol without intravenous contrast. Multiplanar CT image reconstructions of the cervical spine were also generated. COMPARISON:  07/08/2018. FINDINGS: CT HEAD FINDINGS Brain: Interval low density in the right cerebellum and ill-defined low density in the right temporal lobe. It is difficult to determine much of this is due to photon starvation at the skull base with the patient's head tilted Stable mildly enlarged ventricles and cortical sulci. No intracranial hemorrhage or mass lesion. Vascular: No hyperdense vessel or unexpected calcification. Skull: No skull fracture. Sinuses/Orbits: Mild mucosal thickening  involving all of the paranasal sinuses. Status post bilateral cataract extraction. Other: None. CT CERVICAL SPINE FINDINGS Alignment: Stable grade 1 retrolisthesis at the C5-6 level. Mild dextroconvex cervical scoliosis due to the patient's head tilted to the left, unchanged. Skull base and vertebrae: No acute fracture. No primary bone lesion or focal pathologic process. Soft tissues and spinal canal: No prevertebral fluid or swelling. No visible canal hematoma. Disc levels:  Multilevel degenerative changes. Upper chest: The included portions of the upper lobes are clear. Other: Bilateral carotid artery atheromatous calcifications. IMPRESSION: 1. 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 This could all be artifactual due to photon starvation related to the patient's head tilted in the gantry. However, acute infarction cannot be excluded in either area. If this is a clinical concern, recommend further evaluation with MRI of the brain. 2. No skull fracture or intracranial hemorrhage. 3. No cervical spine fracture or traumatic subluxation. 4. Stable mild diffuse cerebral and cerebellar atrophy. 5. Multilevel cervical spine degenerative changes. 6. Mild chronic pansinusitis. 7. Bilateral carotid artery atheromatous calcifications. Electronically Signed   By: Claudie Revering M.D.   On: 04/23/2019 10:34   Ct Abdomen Pelvis W Contrast  Result Date: 04/23/2019 CLINICAL DATA:  Gas and fluid collection inferior to the right inferior pubic ramus on a portable pelvis radiograph earlier today. The patient fell. EXAM: CT ABDOMEN AND PELVIS WITH CONTRAST TECHNIQUE: Multidetector CT imaging of the abdomen and pelvis was performed using the standard protocol following bolus administration of intravenous contrast. CONTRAST:  135mL OMNIPAQUE IOHEXOL 300 MG/ML  SOLN COMPARISON:  Pelvis radiograph and portable chest radiograph obtained earlier today. Abdomen and pelvis CT  dated 03/04/2009. FINDINGS: Lower chest: Dense consolidation in the right lower lobe with air bronchograms. Small right pleural effusion. Enlarged heart. Intracardiac pacer and AICD leads. Hepatobiliary: No focal liver abnormality is seen. No gallstones, gallbladder wall thickening, or biliary dilatation. Pancreas: Unremarkable. No pancreatic ductal dilatation or surrounding inflammatory changes. Spleen: Normal in size without focal abnormality. Adrenals/Urinary Tract: Stable post nephrectomy changes on the right. Normal appearing adrenal glands and left kidney. Interval tortuosity and mild focal dilatation of the distal left ureter, proximal to the ureterovesical junction. The distal ureter is normal in caliber. Mild diffuse bladder wall thickening with 2 small interval diverticula posteriorly, 1 on the left and 1 on the right. Stomach/Bowel: Unremarkable stomach, small bowel and colon. No evidence of appendicitis. Vascular/Lymphatic: Atheromatous arterial calcifications without aneurysm. No enlarged lymph nodes. Reproductive: Normal sized prostate gland containing small, coarse calcifications. Large bilateral hydroceles. Other: Small left inguinal hernia containing fat. No gas seen inferior to the right pubic ramus or in the proximal right thigh. Musculoskeletal: Lumbar and lower thoracic spine degenerative changes. IMPRESSION: 1. Dense consolidation in the right lower lobe with air bronchograms, compatible with pneumonia or dense atelectasis. 2. Small right pleural effusion. 3. No gas seen inferior to the right inferior pubic ramus. The appearance seen previously was due to air trapped beneath the soft tissues of the buttock on the right. 4. Stable post nephrectomy changes on the right. 5. Mild diffuse bladder wall thickening with 2 small interval diverticula posteriorly. 6. Large bilateral hydroceles. 7. Small left inguinal hernia containing fat. Electronically Signed   By: Claudie Revering M.D.   On: 04/23/2019  13:41   Dg Pelvis Portable  Addendum Date: 04/23/2019   ADDENDUM REPORT: 04/23/2019 11:13 ADDENDUM: These results were called by telephone at the time of interpretation on 04/23/2019 at 11:13 am to provider [facility] , who verbally acknowledged these results. Electronically Signed   By: Zetta Bills M.D.   On: 04/23/2019 11:13   Result Date: 04/23/2019 CLINICAL DATA:  Fall. EXAM: PORTABLE PELVIS 1-2 VIEWS COMPARISON:  Abdomen pelvis CT from 2010. FINDINGS: Bubbly lucency just inferior to the right inferior pubic ramus measuring 7.4 x 3.5 cm. No acute bone finding. Hips are located with mild to moderate degenerative changes. IMPRESSION: Gas and fluid collection inferior to the right inferior pubic ramus may represent a soft tissue infection involving pelvic floor or upper thigh, transperineal hernia containing bowel could also have this appearance. Consider CT for further assessment. Electronically Signed: By: Zetta Bills M.D. On: 04/23/2019 10:58   Dg Chest Portable 1 View  Result Date: 04/23/2019 CLINICAL DATA:  Fall. EXAM: PORTABLE CHEST 1 VIEW COMPARISON:  Chest x-ray 07/26/2018 FINDINGS: Hazy opacity at the right lung base with subtle increased interstitial markings and increased retrocardiac opacification as compared to previous study. Similar appearance at the left lung base. Upper chest is clear bilaterally. Cardiomediastinal contours stable with cardiac enlargement. Right-sided dual lead pacer device remains in place. No acute bone finding. IMPRESSION: 1. Interstitial airspace opacities at the lung bases suspicious  for developing developing pneumonia or pneumonitis from aspiration. Electronically Signed   By: Zetta Bills M.D.   On: 04/23/2019 10:54   Dg Knee Left Port  Result Date: 04/25/2019 CLINICAL DATA:  Left knee pain EXAM: PORTABLE LEFT KNEE - 1-2 VIEW COMPARISON:  07/12/2018 FINDINGS: No fracture or dislocation. Advanced arthritis of the medial joint space with  narrowing osteophyte and sclerosis. Mild compensatory widening of the lateral joint space with joint space calcification. Moderate patellofemoral degenerative change. Small knee effusion. IMPRESSION: 1. No acute osseous abnormality 2. Arthritis of the knee with small effusion 3. Chondrocalcinosis Electronically Signed   By: Donavan Foil M.D.   On: 04/25/2019 20:12   Dg Swallowing Func-speech Pathology  Result Date: 04/24/2019 Objective Swallowing Evaluation: Type of Study: MBS-Modified Barium Swallow Study  Patient Details Name: Cody Faulkner MRN: [id number] Date of Birth: [birth year] Today's Date: 04/24/2019 Time: SLP Start Time (ACUTE ONLY): 1150 -SLP Stop Time (ACUTE ONLY): 1210 SLP Time Calculation (min) (ACUTE ONLY): 20 min Past Medical History: Past Medical History: Diagnosis Date  Asthma   Albuterol prn;Symbicort daily  Atrial fibrillation (HCC)   takes COumadin daily  Bruises easily   d/t being on Coumadin  CAD (coronary artery disease)   predominantly single vessel  Cardiomyopathy, ischemic   Cataracts, bilateral to be removed in Aug 2015  Chronic kidney disease 1976  S/P nephrectomy  Constipation   takes Colace daily as needed  Depression   Dilated cardiomyopathy ([place])   s/p defibrillator [employer] D154  Dizziness   occasionally  GERD (gastroesophageal reflux disease)   takes OTC med if needed  Gout   takes Allopurinol daily  Heart failure ([place])   NYHFA     CLASS 3  History of hyperthyroidism   Hyperlipidemia   takes Fenofibrate daily  Hypertension   takes Bisoprolol daily as well as Imdur  Hypothyroidism   takes Synthroid daily  Joint pain   Joint swelling   Left knee DJD   needs surgery  Myocardial infarction ([place]) 2007  OSA (obstructive sleep apnea)   "don't wear mask; can't afford one" (12/20/2013)  Pacemaker   Peripheral edema   takes Furosemide daily  Pneumonia, community acquired last time 2014  "get it ~ q yr; haven't had it yet in 2015" (12/20/2013)  Seasonal  allergies   takes Claritin daily  Shortness of breath   with exertion  Solitary kidney 06/05/2006  Urinary frequency   Urinary incontinence   Urinary urgency   Ventricular tachycardia ([place])   s/p defibrillator-[employer]tronic [employer] D154 Past Surgical History: Past Surgical History: Procedure Laterality Date  CARDIAC CATHETERIZATION  2007  60% Stenosis mid-CFX  CARDIAC DEFIBRILLATOR PLACEMENT  2007  Medtronic [employer]Trust [place] REMOVAL  12/20/2013  CARDIOVERSION  06/18/2011  Procedure: CARDIOVERSION;  Surgeon: Kerry Hough., MD;  Location: [facility];  Service: Cardiovascular;  Laterality: N/A;  CARDIOVERSION N/A 12/06/2012  Procedure: CARDIOVERSION;  Surgeon: Jacolyn Reedy, MD;  Location: [place];  Service: Cardiovascular;  Laterality: N/A;  COLONOSCOPY    ICD GENERATOR CHANGE  07/2012  ICD LEAD REMOVAL Left 12/20/2013  Procedure: ICD LEAD REMOVAL//EXTRACTION AND PLACEMENT OF TEMP/PERM ATRIAL LEAD;  Surgeon: Evans Lance, MD;  Location: [place];  Service: Cardiovascular;  Laterality: Left;  IMPLANTABLE CARDIOVERTER DEFIBRILLATOR IMPLANT N/A 01/25/2014  Procedure: IMPLANTABLE CARDIOVERTER DEFIBRILLATOR IMPLANT;  Surgeon: Evans Lance, MD;  Location: [facility] CATH LAB;  Service: Cardiovascular;  Laterality: N/A;  IMPLANTABLE CARDIOVERTER DEFIBRILLATOR REVISION N/A 07/13/2012  Procedure: IMPLANTABLE CARDIOVERTER  DEFIBRILLATOR REVISION;  Surgeon: Deboraha Sprang, MD;  Location: [facility] C[facility] LAB;  Service: Cardiovascular;  Laterality: N/A;  INSERT / REPLACE / REMOVE PACEMAKER  12/20/2013  KNEE ARTHROSCOPY Left   MULTIPLE TOOTH EXTRACTIONS    "top's are all gone; took some off the bottom too"  NEPHRECTOMY  1976  Right  TEE WITHOUT CARDIOVERSION  07/13/2012  Procedure: TRANSESOPHAGEAL ECHOCARDIOGRAM (TEE);  Surgeon: Lelon Perla, MD;  Location: [facility] ENDOSCOPY;  Service: Cardiovascular;  Laterality: N/A; HPI: 73yo male admitted 04/23/2019 after a fall. PMH: hypertension, paroxysmal atrial  fibrillation on Eliquis, CAD, cardiomyopathy, ventricular tachycardia s/p AICD, CKD stage III, s/p right nephrectomy OSA, hypothyroidism, gout, and arthritis  CXR = Interstitial airspace opacities at the lung bases suspicious for developing developing pneumonia or pneumonitis from aspiration.  Subjective: "is this why I'm coughing so much?" Assessment / Plan / Recommendation CH[facility] IP CLINICAL IMPRESSIONS 04/24/2019 Clinical Impression Pt has a moderate oropharyngeal dysphagia that is possibly more chronic in nature. He has decreased oral control, resulting in anterior and posterior spillage and decreased bolus cohesion. He has intermittent oral residue. His base of tongue retraction is reduced and he has incomplete deflection of his epiglottis - moving downward but not backward. Silent aspiration occurs during the swallow with thin and nectar thick liquids. He seemed to have reduce aspriation events with a head turn to his left, but this did not completely eliminate aspiration from occurring. Honey thick liquids and solids are better contained in the valleculae and therefore are swallowed without aspiration., although residue in the valleculae increases with increased viscosity. Recommend Dys 2 diet and honey thick liquids by single cup sips. 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 It is unclear how this may have fluctuate over this period of time, but pt also reports having had at least four cases of PNA not including this one, although he is unclear of timing. Given that this may be at least acute on chronic, recommend considering palliative care to address overall GOC moving forward.  SLP Visit Diagnosis Dysphagia, oropharyngeal phase (R13.12) Attention and concentration deficit following -- Frontal lobe and executive function deficit following -- Impact on  safety and function Moderate aspiration risk   CHL IP TREATMENT RECOMMENDATION 04/24/2019 Treatment Recommendations Therapy as outlined in treatment plan below   Prognosis 04/24/2019 Prognosis for Safe Diet Advancement Fair Barriers to Reach Goals Time post onset Barriers/Prognosis Comment -- CHL IP DIET RECOMMENDATION 04/24/2019 SLP Diet Recommendations Dysphagia 2 (Fine chop) solids;Honey thick liquids Liquid Administration via Cup;No straw Medication Administration Crushed with puree Compensations Minimize environmental distractions;Slow rate;Small sips/bites Postural Changes Seated upright at 90 degrees   CHL IP OTHER RECOMMENDATIONS 04/24/2019 Recommended Consults -- Oral Care Recommendations Oral care BID Other Recommendations Order thickener from pharmacy;Prohibited food (jello, ice cream, thin soups);Remove water pitcher   CHL IP FOLLOW UP RECOMMENDATIONS 04/24/2019 Follow up Recommendations (No Data)   CHL IP FREQUENCY AND DURATION 04/24/2019 Speech Therapy Frequency (ACUTE ONLY) min 2x/week Treatment Duration 2 weeks      CHL IP ORAL PHASE 04/24/2019 Oral Phase Impaired Oral - Pudding Teaspoon -- Oral - Pudding Cup -- Oral - Honey Teaspoon -- Oral - Honey Cup Right anterior bolus loss;Weak lingual manipulation;Reduced posterior propulsion;Lingual/palatal residue;Decreased bolus cohesion;Premature spillage Oral - Nectar Teaspoon -- Oral - Nectar Cup Right anterior bolus loss;Weak lingual manipulation;Reduced posterior propulsion;Lingual/palatal residue;Decreased  bolus cohesion;Premature spillage Oral - Nectar Straw Right anterior bolus loss;Weak lingual manipulation;Reduced posterior propulsion;Lingual/palatal residue;Decreased bolus cohesion;Premature spillage Oral - Thin Teaspoon -- Oral - Thin Cup Right anterior bolus loss;Weak lingual manipulation;Reduced posterior propulsion;Lingual/palatal residue;Decreased bolus cohesion;Premature spillage Oral - Thin Straw Right anterior bolus loss;Weak lingual  manipulation;Reduced posterior propulsion;Lingual/palatal residue;Decreased bolus cohesion;Premature spillage Oral - Puree Right anterior bolus loss;Weak lingual manipulation;Reduced posterior propulsion;Lingual/palatal residue;Decreased bolus cohesion;Premature spillage Oral - Mech Soft Right anterior bolus loss;Weak lingual manipulation;Reduced posterior propulsion;Lingual/palatal residue;Decreased bolus cohesion;Premature spillage Oral - Regular -- Oral - Multi-Consistency -- Oral - Pill -- Oral Phase - Comment --  CHL IP PHARYNGEAL PHASE 04/24/2019 Pharyngeal Phase Impaired Pharyngeal- Pudding Teaspoon -- Pharyngeal -- Pharyngeal- Pudding Cup -- Pharyngeal -- Pharyngeal- Honey Teaspoon -- Pharyngeal -- Pharyngeal- Honey Cup Reduced tongue base retraction;Reduced epiglottic inversion;Pharyngeal residue - valleculae Pharyngeal -- Pharyngeal- Nectar Teaspoon -- Pharyngeal -- Pharyngeal- Nectar Cup Reduced tongue base retraction;Reduced epiglottic inversion;Pharyngeal residue - valleculae;Penetration/Aspiration during swallow Pharyngeal Material enters airway, passes BELOW cords without attempt by patient to eject out (silent aspiration) Pharyngeal- Nectar Straw Reduced tongue base retraction;Reduced epiglottic inversion;Pharyngeal residue - valleculae;Penetration/Aspiration during swallow Pharyngeal Material enters airway, passes BELOW cords without attempt by patient to eject out (silent aspiration) Pharyngeal- Thin Teaspoon -- Pharyngeal -- Pharyngeal- Thin Cup Reduced tongue base retraction;Reduced epiglottic inversion;Penetration/Aspiration during swallow Pharyngeal Material enters airway, passes BELOW cords without attempt by patient to eject out (silent aspiration) Pharyngeal- Thin Straw Reduced tongue base retraction;Reduced epiglottic inversion;Penetration/Aspiration during swallow Pharyngeal Material enters airway, passes BELOW cords without attempt by patient to eject out (silent aspiration) Pharyngeal-  Puree Reduced tongue base retraction;Reduced epiglottic inversion;Pharyngeal residue - valleculae Pharyngeal -- Pharyngeal- Mechanical Soft Reduced tongue base retraction;Reduced epiglottic inversion;Pharyngeal residue - valleculae Pharyngeal -- Pharyngeal- Regular -- Pharyngeal -- Pharyngeal- Multi-consistency -- Pharyngeal -- Pharyngeal- Pill -- Pharyngeal -- Pharyngeal Comment --  CHL IP CERVICAL ESOPHAGEAL PHASE 04/24/2019 Cervical Esophageal Phase WFL Pudding Teaspoon -- Pudding Cup -- Honey Teaspoon -- Honey Cup -- Nectar Teaspoon -- Nectar Cup -- Nectar Straw -- Thin Teaspoon -- Thin Cup -- Thin Straw -- Puree -- Mechanical Soft -- Regular -- Multi-consistency -- Pill -- Cervical Esophageal Comment -- Venita Sheffield Nix 04/24/2019, 1:27 PM  Pollyann Glen, M.A. [facility] Pager [phone] Office [phone]              Subjective:   Patient was seen and examined 04/28/2019, 2:12 PM Patient stable today. No acute distress.  No issues overnight Stable for discharge.  Discharge Exam:    Vitals:   04/27/19 2121 04/28/19 0300 04/28/19 0340 04/28/19 1404  BP: (!) 153/59  (!) 125/58 (!) 123/95  Pulse: (!) 50  (!) 55 (!) 55  Resp: 20  17   Temp: 98.2 F (36.8 C)  99 F (37.2 C) 98.4 F (36.9 C)  TempSrc: Oral  Oral Oral  SpO2: 95%  95% 96%  Weight:  89.9 kg    Height:        General: Pt lying comfortably in bed & appears in no obvious distress. Cardiovascular: S1 & S2 heard, RRR, S1/S2 +. No murmurs, rubs, gallops or clicks. No JVD or pedal edema. Respiratory: Clear to auscultation without wheezing, rhonchi or crackles. No increased work of breathing. Abdominal:  Non-distended, non-tender & soft. No organomegaly or masses appreciated. Normal bowel sounds heard. CNS: Alert and oriented. No focal deficits. Extremities: no edema, no cyanosis    The results of significant diagnostics from this hospitalization (including imaging, microbiology, ancillary  and  laboratory) are listed below for reference.      Microbiology:   Recent Results (from the past 240 hour(s))  SARS CORONAVIRUS 2 (TAT 6-24 HRS) Nasopharyngeal Nasopharyngeal Swab     Status: None   Collection Time: 04/23/19  5:48 PM   Specimen: Nasopharyngeal Swab  Result Value Ref Range Status   SARS Coronavirus 2 NEGATIVE NEGATIVE Final    Comment: (NOTE) SARS-CoV-2 target nucleic acids are NOT DETECTED. The SARS-CoV-2 RNA is generally detectable in upper and lower respiratory specimens during the acute phase of infection. Negative results do not preclude SARS-CoV-2 infection, do not rule out co-infections with other pathogens, and should not be used as the sole basis for treatment or other patient management decisions. Negative results must be combined with clinical observations, patient history, and epidemiological information. The expected result is Negative. Fact Sheet for Patients: SugarRoll.be Fact Sheet for Healthcare Providers: https://www.woods-mathews.com/ This test is not yet approved or cleared by the [place] FDA and  has been authorized for detection and/or diagnosis of SARS-CoV-2 by FDA under an Emergency Use Authorization (EUA). This EUA will remain  in effect (meaning this test can be used) for the duration of the COVID-19 declaration under Section 56 4(b)(1) of the Act, 21 U.S.C. section 360bbb-3(b)(1), unless the authorization is terminated or revoked sooner. Performed at [facility], [place] [address], [place], [place] [address]   SARS CORONAVIRUS 2 (TAT 6-24 HRS) Nasopharyngeal Nasopharyngeal Swab     Status: None   Collection Time: 04/27/19  4:55 PM   Specimen: Nasopharyngeal Swab  Result Value Ref Range Status   SARS Coronavirus 2 NEGATIVE NEGATIVE Final    Comment: (NOTE) SARS-CoV-2 target nucleic acids are NOT DETECTED. The SARS-CoV-2 RNA is generally detectable in upper and lower respiratory  specimens during the acute phase of infection. Negative results do not preclude SARS-CoV-2 infection, do not rule out co-infections with other pathogens, and should not be used as the sole basis for treatment or other patient management decisions. Negative results must be combined with clinical observations, patient history, and epidemiological information. The expected result is Negative. Fact Sheet for Patients: SugarRoll.be Fact Sheet for Healthcare Providers: https://www.woods-mathews.com/ This test is not yet approved or cleared by the [place] FDA and  has been authorized for detection and/or diagnosis of SARS-CoV-2 by FDA under an Emergency Use Authorization (EUA). This EUA will remain  in effect (meaning this test can be used) for the duration of the COVID-19 declaration under Section 56 4(b)(1) of the Act, 21 U.S.C. section 360bbb-3(b)(1), unless the authorization is terminated or revoked sooner. Performed at [facility], [place] [address], [place], [place] [address]      Labs:   CBC: Recent Labs  Lab 04/23/19 0915 04/24/19 0630 04/27/19 0407 04/28/19 0457  WBC 10.5 7.1 6.8 5.8  NEUTROABS 9.2*  --   --   --   HGB 12.3* 10.6* 9.4* 10.0*  HCT 38.2* 32.8* 30.0* 31.6*  MCV 90.1 88.6 92.0 90.5  PLT 232 236 230 0000000   Basic Metabolic Panel: Recent Labs  Lab 04/23/19 0915 04/24/19 0630 04/27/19 0407 04/28/19 0457  NA 141 142 142 141  K 3.7 3.5 4.2 4.1  CL 106 108 109 108  CO2 21* 23 27 25   GLUCOSE 108* 105* 101* 102*  BUN 35* 31* 27* 31*  CREATININE 1.53* 1.57* 1.24 1.34*  CALCIUM 9.1 8.6* 8.5* 8.6*  MG  --   --  2.2 2.4   Liver  Function Tests: Recent Labs  Lab 04/23/19 0915 04/27/19 0407 04/28/19 0457  AST 22 21 29   ALT 20 22 29   ALKPHOS 42 34* 37*  BILITOT 1.9* 1.1 0.9  PROT 6.8 5.2* 5.3*  ALBUMIN 3.2* 2.1* 2.2*   BNP (last 3 results) Recent Labs    04/23/19 0915  BNP 76.5   Cardiac  Enzymes: Recent Labs  Lab 04/23/19 0915  CKTOTAL 237   CBG: Recent Labs  Lab 04/26/19 1609 04/26/19 2035 04/27/19 0733 04/27/19 1103 04/27/19 1632  GLUCAP 107* 126* 94 112* 112*   Urinalysis    Component Value Date/Time   COLORURINE YELLOW 04/23/2019 1854   APPEARANCEUR HAZY (A) 04/23/2019 1854   LABSPEC 1.043 (H) 04/23/2019 1854   PHURINE 5.0 04/23/2019 1854   GLUCOSEU NEGATIVE 04/23/2019 1854   HGBUR NEGATIVE 04/23/2019 [place] 04/23/2019 [place] 04/23/2019 [place] 04/23/2019 1854   UROBILINOGEN 1.0 12/19/2013 1603   NITRITE NEGATIVE 04/23/2019 1854   LEUKOCYTESUR LARGE (A) 04/23/2019 1854    Time coordinating discharge: Over 55 minutes  SIGNED: Deatra James, MD, FACP, FHM. [facility],  Pager [phone]  If 7PM-7AM, please contact night-coverage [email or web address] 04/28/2019, 2:12 PM

## 2019-04-28 NOTE — Progress Notes (Signed)
Palliative:   Mr. Shaut is resting quietly in bed.  He will make and somewhat keep eye contact.  He appears acutely/chronically ill and frail.  He hs a weak productive cough.  He is able to make his basic needs known. There is no family at bedside at this time.   We talk about discharge to rehab.  We also talk about his declining functional status.  Mr. Hatchell shares that he worries about cousin [name] and [name].    We talk about "life support", and Mr. Giovannoni states he would not want life support.   We talk about PNE, and I encourage Mr. Schumacher to use his IS and flutter valve.   Call to cousin Faizon Rumbaugh at [phone].  [name] shares that last year, after 23 th birthday, family felt they would be surprised if he had another birthday.  [name] shares that [name] has been helping with bathing for the last 4 months.   [name] shares that [name] "went down" from age 21 to 61.  [name] shares that family has seen a decline in mental status after [name] had a fall with head injury.    We talk about DNR status, [name] shares that [name] and family want no life support.  We talk about the chronic illness pathway, what is normal and expected.  We talk about how [name] would want to be cared for the next time he gets sick.  Outpatient palliative services discussed and [name] agrees to out patient palliative services.   Conference with attending and RN CM related to patient condition, needs, disposition.   Plan:   Rehab with likely transition to LTC.  Agreeable to outpatient palliative services to continue [place] discussions with patient and family.   DNR status, goldenrod form completed.   54 minutes  Quinn Axe, NP Palliative Medicine Team Team phone [phone]  Greater than 50%  of this time was spent counseling and coordinating care related to the above assessment and plan.

## 2019-04-28 NOTE — Progress Notes (Signed)
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  AICD interrogated which was negative for arrhythmia.  Chest x-ray showed possible developing pneumonia started on Unasyn.  Initial CT head showed infarct versus artifact.  Unable to get MRI due to AICD.  04/27/2019 -patient remained stable, continue to have challenging disposition.,  Patient was mildly hypotensive, bradycardic.  Otherwise remained stable  04/28/2019 -continue to pursue safe discharge planning -patient's heart rate and blood pressure has improved Remains severely deconditioned debilitated and weak --mentation seems to be at baseline now.  ------------------------------------------------------------------------------------------------------------------------------------------------------------   Subjective:  The patient was seen and examined this morning, with no acute distress, laying comfortably in bed, following commands. The patient and the family has not yet determined which facility that he needs to go to. He was mildly hypotensive with a blood pressure of 92/48 with a heart rate of 45 this morning but alert awake. Denies any chest pain or shortness of breath    ====================================================================================== Assessment & Plan:   Principal Problem:   Aspiration pneumonia ([place]) Active Problems:   ICD (implantable cardioverter-defibrillator) in place   Chronic diastolic CHF (congestive heart failure) ([place])   Hypothyroidism   Long term current use of anticoagulant therapy   CAD nonobstructive- 2007   CKD (chronic kidney disease) stage 3, GFR 30-59 ml/min  Fall at home, initial encounter   Goals of care, counseling/discussion   Palliative care by specialist   DNR (do not resuscitate) discussion  Unresponsive/acute metabolic encephalopathy, improved -Mentation has improved, back to baseline -Initial CT head stroke versus artifact, repeat CT head 11/16-no clear acute CVA. -AICD interrogated-unremarkable -PT/OT  Recurrent falls /debility -Remained severely debilitated eats assist with all ADL, ambulation -Unknown exact etiology.  May have some autonomic dysfunction. -Worrisome secondary to chronic anticoagulation -PT/OT-skilled nursing facility -Fall precaution  Aspiration pneumonia versus pneumonitis -Afebrile, normotensive, hemodynamically stable -No blood cultures were obtained on this admission, sputum analysis pending SARS-CoV-2 x2 - -On IV Unasyn.  Procalcitonin is negative.  -Antibiotics has been DC'd -We will continue to encourage use of incentive spirometer  Chronic paroxysmal atrial fibrillation -Stable rate controlled -On Eliquis and amiodarone.  Chronic dysphagia -Speech recommends dysphagia 2 diet -Tolerating p.o.  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   -Monitor BMP creatinine closely  Atrioventricular tachycardia status post AICD -Interrogated during admission  Bilateral knee osteoarthritis -X-ray knees-negative for acute fracture.  Small effusion.  Hyperlipidemia -On fenofibrate  Hypothyroidism -Normal TSH.  Synthroid 150 mcg daily  BPH -Flomax  GERD -PPI  Goals of care discussion -Given his chronic comorbidities, multiple falls especially while on anticoagulation he would benefit from goals of care discussion.   Palliative  care has been consulted by previous hospitalist.    As noted above with multiple comorbidities deconditioning debility we are highly recommending SNF placement   DVT prophylaxis: Eliquis Code Status: Full code Family Communication: To be determined Disposition Plan: Placement pending, social worker working with the patient and family. Patient is tolerating from chronic: Rarely,  debility, needing SNF placement pending patient and family decision   Objective: Vitals:   04/27/19 2000 04/27/19 2121 04/28/19 0300 04/28/19 0340  BP:  (!) 153/59  (!) 125/58  Pulse: (!) 56 (!) 50  (!) 55  Resp: (!) 30 20  17   Temp:  98.2 F (36.8 C)  99 F (37.2 C)  TempSrc:  Oral  Oral  SpO2: 96% 95%  95%  Weight:   89.9 kg   Height:        Intake/Output Summary (Last 24 hours) at 04/28/2019 0912 Last data filed at 04/28/2019 0615 Gross per 24 hour  Intake 400 ml  Output 1475 ml  Net -1075 ml   Filed Weights   04/26/19 0447 04/27/19 0355 04/28/19 0300  Weight: 90.4 kg 90.2 kg 89.9 kg    BP (!) 125/58 (BP Location: Left Arm)   Pulse (!) 55   Temp 99 F (37.2 C) (Oral)   Resp 17   Ht 5\' 6"  (1.676 m)   Wt 89.9 kg   SpO2 95%   BMI 31.99 kg/m    Physical Exam  Constitution:  Alert, cooperative, no distress,  Psychiatric: Mood stable, cooperative, questionable judgment HEENT: Normocephalic, PERRL, otherwise with in Normal limits  Chest:Chest symmetric Cardio vascular:  S1/S2, RRR, No murmure, No Rubs or Gallops  pulmonary: Clear to auscultation bilaterally, respirations unlabored, negative wheezes / crackles Abdomen: Soft, non-tender, non-distended, bowel sounds,no masses, no organomegaly Muscular skeletal:  Severe generalized weaknesses, with a gait disturbances, unable to ambulate without assist  , able to move all 4 extremities, Normal strength,  Neuro: CNII-XII intact. , normal motor and sensation, reflexes intact  Extremities:  Lower extremity weakness, +2 pitting edema lower  extremities, +2 pulses  Skin: Dry, warm to touch, negative for any Rashes, No open wounds Wounds: per nursing documentation        Data Reviewed:   CBC: Recent Labs  Lab 04/23/19 0915 04/24/19 0630 04/27/19 0407 04/28/19 0457  WBC 10.5 7.1 6.8 5.8  NEUTROABS 9.2*  --   --   --   HGB 12.3* 10.6* 9.4* 10.0*  HCT 38.2* 32.8* 30.0* 31.6*  MCV 90.1 88.6 92.0 90.5  PLT 232 236 230 0000000   Basic Metabolic Panel: Recent Labs  Lab 04/23/19 0915 04/24/19 0630 04/27/19 0407 04/28/19 0457  NA 141 142 142 141  K 3.7 3.5 4.2 4.1  CL 106 108 109 108  CO2 21* 23 27 25   GLUCOSE 108* 105* 101* 102*  BUN 35* 31* 27* 31*  CREATININE 1.53* 1.57* 1.24 1.34*  CALCIUM 9.1 8.6* 8.5* 8.6*  MG  --   --  2.2 2.4   GFR: Estimated Creatinine Clearance: 50 mL/min (A) (by C-G formula based on SCr of 1.34 mg/dL (H)). Liver Function Tests: Recent Labs  Lab 04/23/19 0915 04/27/19 0407 04/28/19 0457  AST 22 21 29   ALT 20 22 29   ALKPHOS 42 34* 37*  BILITOT 1.9* 1.1 0.9  PROT 6.8 5.2* 5.3*  ALBUMIN 3.2* 2.1* 2.2*  Cardiac Enzymes: Recent Labs  Lab 04/23/19 0915  CKTOTAL 237   BNP (last 3 results) Recent Labs    01/05/19 1112  PROBNP 314   HbA1C: No results for input(s): HGBA1C in the last 72 hours. CBG: Recent Labs  Lab 04/26/19 1609 04/26/19 2035 04/27/19 0733 04/27/19 1103 04/27/19 1632  GLUCAP 107* 126* 94 112* 112*  Sepsis Labs: Recent Labs  Lab 04/23/19 0915 04/23/19 1710 04/24/19 0630 04/25/19 0922  PROCALCITON  --  0.11 0.13 <0.10  LATICACIDVEN 0.9 0.7  --   --     Recent Results (from the past 240 hour(s))  SARS CORONAVIRUS 2 (TAT 6-24 HRS) Nasopharyngeal Nasopharyngeal Swab     Status: None   Collection Time: 04/23/19  5:48 PM   Specimen: Nasopharyngeal Swab  Result Value Ref Range Status   SARS Coronavirus 2 NEGATIVE NEGATIVE Final    Comment: (NOTE) SARS-CoV-2 target nucleic acids are NOT DETECTED. The SARS-CoV-2 RNA is generally detectable in  upper and lower r  SARS CORONAVIRUS 2 (TAT 6-24 HRS) Nasopharyngeal Nasopharyngeal Swab     Status: None   Collection Time: 04/27/19  4:55 PM   Specimen: Nasopharyngeal Swab  Result Value Ref Range Status   SARS Coronavirus 2 NEGATIVE NEGATIVE Final    Comment: (NOTE) SARS-CoV-2 target nucleic acids are NOT DETECTED. The SARS-CoV-2 RNA is generally detectable in upper and lower r         Radiology Studies: No results found.      Scheduled Meds: . amiodarone  200 mg Oral Daily  . apixaban  5 mg Oral BID  . diclofenac Sodium  2 g Topical BID  . fenofibrate  160 mg Oral Daily  . furosemide  20 mg Oral Daily  . guaiFENesin  200 mg Oral Q4H  . levothyroxine  150 mcg Oral QAC breakfast  . pantoprazole sodium  40 mg Oral Daily  . potassium chloride SA  20 mEq Oral Daily  . risperiDONE  0.5 mg Oral q morning - 10a  . risperiDONE  1 mg Oral QHS  . sodium chloride flush  10-40 mL Intracatheter Q12H  . tamsulosin  0.4 mg Oral Daily   Continuous Infusions:    LOS: 5 days   Time spent= 15 mins    Deatra James, MD [facility]  If 7PM-7AM, please contact night-coverage  04/28/2019, 9:12 AM

## 2019-04-28 NOTE — Progress Notes (Signed)
Call attempted to give a report to [facility],.  Idolina Primer, RN

## 2019-04-28 NOTE — Progress Notes (Signed)
Call attempted to give a report to [place] x2.  Idolina Primer, RN

## 2019-04-28 NOTE — TOC Transition Note (Addendum)
Transition of Care [facility]) - CM/SW Discharge Note   Patient Details  Name: ZOHAIB CRAFT MRN: [id number] Date of Birth: [birth year]  Transition of Care [facility] CM/SW Contact:  Bethena Roys, RN Phone Number: 04/28/2019, 12:38 PM   Clinical Narrative:   PTAR scheduled for 2:00 pm transport. RN to call report to [phone]. Pt will go to room 115. No further needs from CM at this time.     Final next level of care: Skilled Nursing Facility Barriers to Discharge: No Barriers Identified   Patient Goals and CMS Choice Patient states their goals for this hospitalization and ongoing recovery are:: "to feel better" CMS Medicare.gov Compare Post Acute Care list provided to:: Patient Choice offered to / list presented to : Patient  Discharge Plan and Services In-house Referral: NA Discharge Planning Services: CM Consult Post Acute Care Choice: [place]             Social Determinants of Health (SDOH) Interventions     Readmission Risk Interventions Readmission Risk Prevention Plan 04/25/2019  Transportation Screening Complete  HRI or [place] Complete  Social Work Consult for [place] Planning/Counseling Complete  Palliative Care Screening Not Applicable  Medication Review [employer]) Complete  Some recent data might be hidden

## 2019-04-28 NOTE — TOC Transition Note (Addendum)
Transition of Care [facility]) - CM/SW Discharge Note   Patient Details  Name: PUAL CHEADLE MRN: [id number] Date of Birth: [birth year]  Transition of Care [facility] CM/SW Contact:  Bethena Roys, RN Phone Number: 04/28/2019, 10:32 AM   Clinical Narrative:   Patient received bed offer at [facility]- male bed available today. ERIC- cousin-HCPOA is aware and agreeable to placement. [name] will work on [employer] application CM received authorization ID # Number [id number]. Start of Care: 04-28-19 Next review 05-02-19. Continued stay review [id number]. MD will place d/c summary and patient will transport via [facility] to [place]. No further needs from CM at this time.   [facility] will follow with Palliative Services- SNF aware.   Final next level of care: Skilled Nursing Facility Barriers to Discharge: No Barriers Identified   Patient Goals and CMS Choice Patient states their goals for this hospitalization and ongoing recovery are:: "to feel better" CMS Medicare.gov Compare Post Acute Care list provided to:: Patient Choice offered to / list presented to : Patient   Discharge Plan and Services In-house Referral: NA Discharge Planning Services: CM Consult Post Acute Care Choice: [place]              Readmission Risk Interventions Readmission Risk Prevention Plan 04/25/2019  Transportation Screening Complete  HRI or Home Care Consult Complete  Social Work Consult for [place] Planning/Counseling Complete  Palliative Care Screening Not Applicable  Medication Review Press photographer) Complete  Some recent data might be hidden

## 2019-04-28 NOTE — Progress Notes (Deleted)
Physician Discharge Summary [facility]    Patient: Cody Faulkner                   Admit date: 04/23/2019   DOB: 1945/11/18             Discharge date:04/28/2019/11:21 AM [id number]                          PCP: Cyndi Bender, PA-C  Disposition: SNF  Recommendations for Outpatient Follow-up:    Follow up: in 2 weeks  Discharge Condition: Stable   Code Status:   Code Status: DNR  Diet recommendation: Cardiac diet   Discharge Diagnoses:    Principal Problem:   Aspiration pneumonia ([place]) Active Problems:   ICD (implantable cardioverter-defibrillator) in place   Chronic diastolic CHF (congestive heart failure) ([place])   Hypothyroidism   Long term current use of anticoagulant therapy   CAD nonobstructive- 2007   CKD (chronic kidney disease) stage 3, GFR 30-59 ml/min   Fall at home, initial encounter   Goals of care, counseling/discussion   Palliative care by specialist   DNR (do not resuscitate) discussion   History of Present Illness/ Hospital Course [name] Summary:    Brief Narrative:  73 year old with history of paroxysmal atrial flutter on Eliquis, CAD, cardiomyopathy, ventricular tachycardia status post AICD, CKD stage III status post right nephrectomy, OSA, hypothyroidism, gout, arthritis admitted for unresponsiveness.  AICD interrogated which was negative for arrhythmia.  Chest x-ray showed possible developing pneumonia started on Unasyn.  Initial CT head showed infarct versus artifact.  Unable to get MRI due to AICD.  04/27/2019 -patient remained stable, continue to have challenging disposition.,  Patient was mildly hypotensive, bradycardic.  Otherwise remained stable  04/28/2019 -continue to pursue safe discharge planning -patient's heart rate and blood pressure has improved Remains severely deconditioned debilitated and weak --mentation seems to be at baseline  now.  ====================================================================================== Detailed discharge summary/A &P:   Principal Problem:   Aspiration pneumonia (Cody Faulkner) Active Problems:   ICD (implantable cardioverter-defibrillator) in place   Chronic diastolic CHF (congestive heart failure) (Cody Faulkner)   Hypothyroidism   Long term current use of anticoagulant therapy   CAD nonobstructive- 2007   CKD (chronic kidney disease) stage 3, GFR 30-59 ml/min   Fall at home, initial encounter   Goals of care, counseling/discussion   Palliative care by specialist   DNR (do not resuscitate) discussion  Unresponsive/acute metabolic encephalopathy, improved -Mentation has improved, back to baseline -Initial CT head stroke versus artifact, repeat CT head 11/16-no clear acute CVA. -AICD interrogated-unremarkable -PT/OT  Recurrent falls /debility -Remained severely debilitated eats assist with all ADL, ambulation -Unknown exact etiology.  May have some autonomic dysfunction. -Worrisome secondary to chronic anticoagulation -PT/OT-skilled nursing facility -Fall precaution  Aspiration pneumonia versus pneumonitis -Afebrile, normotensive, hemodynamically stable -No blood cultures were obtained on this admission, sputum analysis pending SARS-CoV-2 x2 - -On IV Unasyn.  Procalcitonin is negative.  -Antibiotics has been DC'd -We will continue to encourage use of incentive spirometer  Chronic paroxysmal atrial fibrillation -Stable rate controlled -On Eliquis -Amiodarone DC'd as patient was having bradycardia -Considering restarting if recurrent A. Fib, tachyarrhythmia.  Chronic dysphagia -Speech recommends dysphagia 2 diet -Tolerating p.o.  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   -Monitor BMP creatinine closely  Atrioventricular tachycardia status post AICD -Interrogated during admission  Bilateral knee osteoarthritis -X-ray knees-negative for acute fracture.  Small effusion.  Hyperlipidemia -On fenofibrate  Hypothyroidism -Normal TSH.  Synthroid 150 mcg daily  BPH -Flomax  GERD -PPI  Goals of care discussion -Given his chronic comorbidities, multiple falls especially while on anticoagulation he would benefit from goals of care discussion.   Palliative care has been consulted by previous hospitalist.    As noted above with multiple comorbidities deconditioning debility we are highly recommending SNF placement   DVT prophylaxis:  on Eliquis  Code Status:  DNR/DNI Family Communication: Palliative care was consulted had extensive discussion with the patient and family, family has agreed to DNR/DNI status.  Palliative care recommended follow-up as an outpatient. Family is agreeable to above plan.  Disposition Plan: Skilled nursing facility.       Discharge Instructions:   Discharge Instructions    (HEART FAILURE PATIENTS) Call MD:  Anytime you have any of the following symptoms: 1) 3 pound weight gain in 24 hours or 5 pounds in 1 week 2) shortness of breath, with or without a dry hacking cough 3) swelling in the hands, feet or stomach 4) if you have to sleep on extra pillows at night in order to breathe.   Complete by: As directed    Activity as tolerated - No restrictions   Complete by: As directed    Diet - low sodium heart healthy   Complete by: As directed    Discharge instructions   Complete by: As directed    Follow-up with palliative as an outpatient, at SNF   Increase activity slowly   Complete by: As directed        Medication List    STOP taking these medications   allopurinol 100 MG tablet Commonly known as: ZYLOPRIM   amiodarone  200 MG tablet Commonly known as: PACERONE   diclofenac sodium 1 % Gel Commonly known as: VOLTAREN Replaced by: diclofenac Sodium 1 % Gel   fenofibrate 160 MG tablet   Klor-Con M20 20 MEQ tablet Generic drug: potassium chloride SA   pantoprazole 40 MG tablet Commonly known as: PROTONIX Replaced by: pantoprazole sodium 40 mg/20 mL Pack     TAKE these medications   acetaminophen 325 MG tablet Commonly known as: TYLENOL Take 2 tablets (650 mg total) by mouth every 6 (six) hours as needed for mild pain (or Fever >/= 101). What changed:   medication strength  how much to take  reasons to take this   albuterol 108 (90 Base) MCG/ACT inhaler Commonly known as: VENTOLIN HFA Inhale 2 puffs into the lungs every 6 (six) hours as needed for wheezing or shortness of breath.   cyanocobalamin 1000 MCG tablet Take 1 tablet (1,000 mcg total) by mouth daily.   diclofenac Sodium 1 % Gel Commonly known as: VOLTAREN Apply 2 g topically 2 (two) times daily. Replaces: diclofenac sodium 1 % Gel   Eliquis 5 MG Tabs tablet Generic drug: apixaban TAKE 1 TABLET BY MOUTH TWICE A DAY What changed: how much to take   fluticasone 50 MCG/ACT nasal spray Commonly known as: FLONASE Place 2 sprays into both nostrils daily.   furosemide 20 MG tablet Commonly known as: LASIX Take 1 tablet (20 mg total) by mouth daily. Start taking on: April 29, 2019 What changed:   medication strength  how much to take  guaiFENesin 200 MG tablet Take 1 tablet (200 mg total) by mouth every 4 (four) hours.   levothyroxine 150 MCG tablet Commonly known as: SYNTHROID Take 1 tablet (150 mcg total) by mouth daily before breakfast. Start taking on: April 29, 2019 What changed: when to take this   pantoprazole sodium 40 mg/20 mL Pack Commonly known as: PROTONIX Take 20 mLs (40 mg total) by mouth daily. Replaces: pantoprazole 40 MG tablet   polyethylene glycol 17 g packet Commonly known as: MIRALAX /  GLYCOLAX Take 17 g by mouth daily as needed for moderate constipation or severe constipation. What changed:   when to take this  reasons to take this   risperiDONE 1 MG tablet Commonly known as: RISPERDAL Take 1 tablet (1 mg total) by mouth at bedtime. What changed: You were already taking a medication with the same name, and this prescription was added. Make sure you understand how and when to take each.   risperiDONE 0.5 MG tablet Commonly known as: RISPERDAL Take 1 tablet (0.5 mg total) by mouth every morning. Start taking on: April 29, 2019 What changed:   medication strength  how much to take  when to take this  additional instructions   senna-docusate 8.6-50 MG tablet Commonly known as: Senokot-S Take 2 tablets by mouth at bedtime as needed for mild constipation or moderate constipation.   tamsulosin 0.4 MG Caps capsule Commonly known as: FLOMAX Take 1 capsule (0.4 mg total) by mouth daily.       Allergies  Allergen Reactions   Methimazole [Methimazole] Itching and Rash    Severe rash     Procedures /Studies:   Ct Head Wo Contrast  Result Date: 04/24/2019 CLINICAL DATA:  Abnormal prior CT. EXAM: CT HEAD WITHOUT CONTRAST TECHNIQUE: Contiguous axial images were obtained from the base of the skull through the vertex without intravenous contrast. COMPARISON:  04/23/2019 FINDINGS: Brain: As on the prior study, the patient has been scanned with the head at an oblique orientation. The hypodensity seen previously in the anterior right temporal lobe and right cerebellum is less prominent on the current study. Diffuse loss of parenchymal volume is consistent with atrophy. Patchy low attenuation in the deep hemispheric and periventricular white matter is nonspecific, but likely reflects chronic microvascular ischemic demyelination. Vascular: No hyperdense vessel or unexpected calcification. Skull: No evidence for fracture. No worrisome lytic or sclerotic lesion.  Sinuses/Orbits: Mild chronic mucosal thickening noted maxillary sinuses. Visualized portions of the globes and intraorbital fat are unremarkable. Other: None. IMPRESSION: 1. The hypodensity seen previously in the anterior right temporal lobe and right cerebellum is less prominent on today's study suggesting appearance on prior study was artifactual. 2. Atrophy with chronic small vessel white matter ischemic disease. Electronically Signed   By: Misty Stanley M.D.   On: 04/24/2019 09:04   Ct Head Wo Contrast  Result Date: 04/23/2019 CLINICAL DATA:  Neck pain and left facial bruising following a fall last night. EXAM: CT HEAD WITHOUT CONTRAST CT CERVICAL SPINE WITHOUT CONTRAST TECHNIQUE: Multidetector CT imaging of the head and cervical spine was performed following the standard protocol without intravenous contrast. Multiplanar CT image reconstructions of the cervical spine were also generated. COMPARISON:  07/08/2018. FINDINGS: CT HEAD FINDINGS Brain: Interval low density in the right cerebellum and ill-defined low density in the right temporal lobe. It is difficult to determine much of this is due to photon starvation at the skull base with the patient's head tilted Stable mildly enlarged ventricles and cortical sulci. No  intracranial hemorrhage or mass lesion. Vascular: No hyperdense vessel or unexpected calcification. Skull: No skull fracture. Sinuses/Orbits: Mild mucosal thickening involving all of the paranasal sinuses. Status post bilateral cataract extraction. Other: None. CT CERVICAL SPINE FINDINGS Alignment: Stable grade 1 retrolisthesis at the C5-6 level. Mild dextroconvex cervical scoliosis due to the patient's head tilted to the left, unchanged. Skull base and vertebrae: No acute fracture. No primary bone lesion or focal pathologic process. Soft tissues and spinal canal: No prevertebral fluid or swelling. No visible canal hematoma. Disc levels:  Multilevel degenerative changes. Upper chest: The  included portions of the upper lobes are clear. Other: Bilateral carotid artery atheromatous calcifications. IMPRESSION: 1. 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 This could all be artifactual due to photon starvation related to the patient's head tilted in the gantry. However, acute infarction cannot be excluded in either area. If this is a clinical concern, recommend further evaluation with MRI of the brain. 2. No skull fracture or intracranial hemorrhage. 3. No cervical spine fracture or traumatic subluxation. 4. Stable mild diffuse cerebral and cerebellar atrophy. 5. Multilevel cervical spine degenerative changes. 6. Mild chronic pansinusitis. 7. Bilateral carotid artery atheromatous calcifications. Electronically Signed   By: Claudie Revering M.D.   On: 04/23/2019 10:34   Ct Cervical Spine Wo Contrast  Result Date: 04/23/2019 CLINICAL DATA:  Neck pain and left facial bruising following a fall last night. EXAM: CT HEAD WITHOUT CONTRAST CT CERVICAL SPINE WITHOUT CONTRAST TECHNIQUE: Multidetector CT imaging of the head and cervical spine was performed following the standard protocol without intravenous contrast. Multiplanar CT image reconstructions of the cervical spine were also generated. COMPARISON:  07/08/2018. FINDINGS: CT HEAD FINDINGS Brain: Interval low density in the right cerebellum and ill-defined low density in the right temporal lobe. It is difficult to determine much of this is due to photon starvation at the skull base with the patient's head tilted Stable mildly enlarged ventricles and cortical sulci. No intracranial hemorrhage or mass lesion. Vascular: No hyperdense vessel or unexpected calcification. Skull: No skull fracture. Sinuses/Orbits: Mild mucosal thickening involving all of the paranasal sinuses. Status post bilateral cataract extraction. Other: None. CT CERVICAL SPINE FINDINGS Alignment: Stable grade 1 retrolisthesis at the  C5-6 level. Mild dextroconvex cervical scoliosis due to the patient's head tilted to the left, unchanged. Skull base and vertebrae: No acute fracture. No primary bone lesion or focal pathologic process. Soft tissues and spinal canal: No prevertebral fluid or swelling. No visible canal hematoma. Disc levels:  Multilevel degenerative changes. Upper chest: The included portions of the upper lobes are clear. Other: Bilateral carotid artery atheromatous calcifications. IMPRESSION: 1. 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 This could all be artifactual due to photon starvation related to the patient's head tilted in the gantry. However, acute infarction cannot be excluded in either area. If this is a clinical concern, recommend further evaluation with MRI of the brain. 2. No skull fracture or intracranial hemorrhage. 3. No cervical spine fracture or traumatic subluxation. 4. Stable mild diffuse cerebral and cerebellar atrophy. 5. Multilevel cervical spine degenerative changes. 6. Mild chronic pansinusitis. 7. Bilateral carotid artery atheromatous calcifications. Electronically Signed   By: Claudie Revering M.D.   On: 04/23/2019 10:34   Ct Abdomen Pelvis W Contrast  Result Date: 04/23/2019 CLINICAL DATA:  Gas and fluid collection inferior to the right inferior pubic ramus on a portable pelvis radiograph earlier today. The  patient fell. EXAM: CT ABDOMEN AND PELVIS WITH CONTRAST TECHNIQUE: Multidetector CT imaging of the abdomen and pelvis was performed using the standard protocol following bolus administration of intravenous contrast. CONTRAST:  130mL OMNIPAQUE IOHEXOL 300 MG/ML  SOLN COMPARISON:  Pelvis radiograph and portable chest radiograph obtained earlier today. Abdomen and pelvis CT dated 03/04/2009. FINDINGS: Lower chest: Dense consolidation in the right lower lobe with air bronchograms. Small right pleural effusion. Enlarged heart. Intracardiac pacer  and AICD leads. Hepatobiliary: No focal liver abnormality is seen. No gallstones, gallbladder wall thickening, or biliary dilatation. Pancreas: Unremarkable. No pancreatic ductal dilatation or surrounding inflammatory changes. Spleen: Normal in size without focal abnormality. Adrenals/Urinary Tract: Stable post nephrectomy changes on the right. Normal appearing adrenal glands and left kidney. Interval tortuosity and mild focal dilatation of the distal left ureter, proximal to the ureterovesical junction. The distal ureter is normal in caliber. Mild diffuse bladder wall thickening with 2 small interval diverticula posteriorly, 1 on the left and 1 on the right. Stomach/Bowel: Unremarkable stomach, small bowel and colon. No evidence of appendicitis. Vascular/Lymphatic: Atheromatous arterial calcifications without aneurysm. No enlarged lymph nodes. Reproductive: Normal sized prostate gland containing small, coarse calcifications. Large bilateral hydroceles. Other: Small left inguinal hernia containing fat. No gas seen inferior to the right pubic ramus or in the proximal right thigh. Musculoskeletal: Lumbar and lower thoracic spine degenerative changes. IMPRESSION: 1. Dense consolidation in the right lower lobe with air bronchograms, compatible with pneumonia or dense atelectasis. 2. Small right pleural effusion. 3. No gas seen inferior to the right inferior pubic ramus. The appearance seen previously was due to air trapped beneath the soft tissues of the buttock on the right. 4. Stable post nephrectomy changes on the right. 5. Mild diffuse bladder wall thickening with 2 small interval diverticula posteriorly. 6. Large bilateral hydroceles. 7. Small left inguinal hernia containing fat. Electronically Signed   By: Claudie Revering M.D.   On: 04/23/2019 13:41   Dg Pelvis Portable  Addendum Date: 04/23/2019   ADDENDUM REPORT: 04/23/2019 11:13 ADDENDUM: These results were called by telephone at the time of interpretation on  04/23/2019 at 11:13 am to provider [facility] , who verbally acknowledged these results. Electronically Signed   By: Zetta Bills M.D.   On: 04/23/2019 11:13   Result Date: 04/23/2019 CLINICAL DATA:  Fall. EXAM: PORTABLE PELVIS 1-2 VIEWS COMPARISON:  Abdomen pelvis CT from 2010. FINDINGS: Bubbly lucency just inferior to the right inferior pubic ramus measuring 7.4 x 3.5 cm. No acute bone finding. Hips are located with mild to moderate degenerative changes. IMPRESSION: Gas and fluid collection inferior to the right inferior pubic ramus may represent a soft tissue infection involving pelvic floor or upper thigh, transperineal hernia containing bowel could also have this appearance. Consider CT for further assessment. Electronically Signed: By: Zetta Bills M.D. On: 04/23/2019 10:58   Dg Chest Portable 1 View  Result Date: 04/23/2019 CLINICAL DATA:  Fall. EXAM: PORTABLE CHEST 1 VIEW COMPARISON:  Chest x-ray 07/26/2018 FINDINGS: Hazy opacity at the right lung base with subtle increased interstitial markings and increased retrocardiac opacification as compared to previous study. Similar appearance at the left lung base. Upper chest is clear bilaterally. Cardiomediastinal contours stable with cardiac enlargement. Right-sided dual lead pacer device remains in place. No acute bone finding. IMPRESSION: 1. Interstitial airspace opacities at the lung bases suspicious for developing developing pneumonia or pneumonitis from aspiration. Electronically Signed   By: Zetta Bills M.D.   On: 04/23/2019 10:54   Dg  Knee Left Port  Result Date: 04/25/2019 CLINICAL DATA:  Left knee pain EXAM: PORTABLE LEFT KNEE - 1-2 VIEW COMPARISON:  07/12/2018 FINDINGS: No fracture or dislocation. Advanced arthritis of the medial joint space with narrowing osteophyte and sclerosis. Mild compensatory widening of the lateral joint space with joint space calcification. Moderate patellofemoral degenerative change. Small knee  effusion. IMPRESSION: 1. No acute osseous abnormality 2. Arthritis of the knee with small effusion 3. Chondrocalcinosis Electronically Signed   By: Donavan Foil M.D.   On: 04/25/2019 20:12   Dg Swallowing Func-speech Pathology  Result Date: 04/24/2019 Objective Swallowing Evaluation: Type of Study: MBS-Modified Barium Swallow Study  Patient Details Name: TRAPPER GOING MRN: [id number] Date of Birth: 11-15-1945 Today's Date: 04/24/2019 Time: SLP Start Time (ACUTE ONLY): 1150 -SLP Stop Time (ACUTE ONLY): 1210 SLP Time Calculation (min) (ACUTE ONLY): 20 min Past Medical History: Past Medical History: Diagnosis Date  Asthma   Albuterol prn;Symbicort daily  Atrial fibrillation (HCC)   takes COumadin daily  Bruises easily   d/t being on Coumadin  CAD (coronary artery disease)   predominantly single vessel  Cardiomyopathy, ischemic   Cataracts, bilateral to be removed in Aug 2015  Chronic kidney disease 1976  S/P nephrectomy  Constipation   takes Colace daily as needed  Depression   Dilated cardiomyopathy ([place])   s/p defibrillator [employer] D154  Dizziness   occasionally  GERD (gastroesophageal reflux disease)   takes OTC med if needed  Gout   takes Allopurinol daily  Heart failure ([place])   NYHFA     CLASS 3  History of hyperthyroidism   Hyperlipidemia   takes Fenofibrate daily  Hypertension   takes Bisoprolol daily as well as Imdur  Hypothyroidism   takes Synthroid daily  Joint pain   Joint swelling   Left knee DJD   needs surgery  Myocardial infarction ([place]) 2007  OSA (obstructive sleep apnea)   "don't wear mask; can't afford one" (12/20/2013)  Pacemaker   Peripheral edema   takes Furosemide daily  Pneumonia, community acquired last time 2014  "get it ~ q yr; haven't had it yet in 2015" (12/20/2013)  Seasonal allergies   takes Claritin daily  Shortness of breath   with exertion  Solitary kidney 06/05/2006  Urinary frequency   Urinary incontinence   Urinary urgency   Ventricular  tachycardia ([place])   s/p defibrillator-[employer]tronic [employer] D154 Past Surgical History: Past Surgical History: Procedure Laterality Date  CARDIAC CATHETERIZATION  2007  60% Stenosis mid-CFX  CARDIAC DEFIBRILLATOR PLACEMENT  2007  [employer]onic [employer] [place] REMOVAL  12/20/2013  CARDIOVERSION  06/18/2011  Procedure: CARDIOVERSION;  Surgeon: Kerry Hough., MD;  Location: [place];  Service: Cardiovascular;  Laterality: N/A;  CARDIOVERSION N/A 12/06/2012  Procedure: CARDIOVERSION;  Surgeon: Jacolyn Reedy, MD;  Location: [place];  Service: Cardiovascular;  Laterality: N/A;  COLONOSCOPY    ICD GENERATOR CHANGE  07/2012  ICD LEAD REMOVAL Left 12/20/2013  Procedure: ICD LEAD REMOVAL//EXTRACTION AND PLACEMENT OF TEMP/PERM ATRIAL LEAD;  Surgeon: Evans Lance, MD;  Location: [place];  Service: Cardiovascular;  Laterality: Left;  IMPLANTABLE CARDIOVERTER DEFIBRILLATOR IMPLANT N/A 01/25/2014  Procedure: IMPLANTABLE CARDIOVERTER DEFIBRILLATOR IMPLANT;  Surgeon: Evans Lance, MD;  Location: [facility] CATH LAB;  Service: Cardiovascular;  Laterality: N/A;  IMPLANTABLE CARDIOVERTER DEFIBRILLATOR REVISION N/A 07/13/2012  Procedure: IMPLANTABLE CARDIOVERTER DEFIBRILLATOR REVISION;  Surgeon: Deboraha Sprang, MD;  Location: [facility] CATH LAB;  Service: Cardiovascular;  Laterality: N/A;  INSERT / REPLACE / REMOVE  PACEMAKER  12/20/2013  KNEE ARTHROSCOPY Left   MULTIPLE TOOTH EXTRACTIONS    "top's are all gone; took some off the bottom too"  NEPHRECTOMY  1976  Right  TEE WITHOUT CARDIOVERSION  07/13/2012  Procedure: TRANSESOPHAGEAL ECHOCARDIOGRAM (TEE);  Surgeon: Lelon Perla, MD;  Location: [facility] ENDOSCOPY;  Service: Cardiovascular;  Laterality: N/A; HPI: 73yo male admitted 04/23/2019 after a fall. PMH: hypertension, paroxysmal atrial fibrillation on Eliquis, CAD, cardiomyopathy, ventricular tachycardia s/p AICD, CKD stage III, s/p right nephrectomy OSA, hypothyroidism, gout, and arthritis  CXR = Interstitial airspace  opacities at the lung bases suspicious for developing developing pneumonia or pneumonitis from aspiration.  Subjective: "is this why I'm coughing so much?" Assessment / Plan / Recommendation CH[facility] IP CLINICAL IMPRESSIONS 04/24/2019 Clinical Impression Pt has a moderate oropharyngeal dysphagia that is possibly more chronic in nature. He has decreased oral control, resulting in anterior and posterior spillage and decreased bolus cohesion. He has intermittent oral residue. His base of tongue retraction is reduced and he has incomplete deflection of his epiglottis - moving downward but not backward. Silent aspiration occurs during the swallow with thin and nectar thick liquids. He seemed to have reduce aspriation events with a head turn to his left, but this did not completely eliminate aspiration from occurring. Honey thick liquids and solids are better contained in the valleculae and therefore are swallowed without aspiration., although residue in the valleculae increases with increased viscosity. Recommend Dys 2 diet and honey thick liquids by single cup sips. 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 It is unclear how this may have fluctuate over this period of time, but pt also reports having had at least four cases of PNA not including this one, although he is unclear of timing. Given that this may be at least acute on chronic, recommend considering palliative care to address overall GOC moving forward.  SLP Visit Diagnosis Dysphagia, oropharyngeal phase (R13.12) Attention and concentration deficit following -- Frontal lobe and executive function deficit following -- Impact on safety and function Moderate aspiration risk   [facility] IP TREATMENT RECOMMENDATION 04/24/2019 Treatment Recommendations Therapy as outlined in treatment plan below   Prognosis 04/24/2019  Prognosis for Safe Diet Advancement Fair Barriers to Reach Goals Time post onset Barriers/Prognosis Comment -- [facility] IP DIET RECOMMENDATION 04/24/2019 SLP Diet Recommendations Dysphagia 2 (Fine chop) solids;Honey thick liquids Liquid Administration via Cup;No straw Medication Administration Crushed with puree Compensations Minimize environmental distractions;Slow rate;Small sips/bites Postural Changes Seated upright at 90 degrees   [facility] IP OTHER RECOMMENDATIONS 04/24/2019 Recommended Consults -- Oral Care Recommendations Oral care BID Other Recommendations Order thickener from pharmacy;Prohibited food (jello, ice cream, thin soups);Remove water pitcher   CHL IP FOLLOW UP RECOMMENDATIONS 04/24/2019 Follow up Recommendations (No Data)   [facility] IP FREQUENCY AND DURATION 04/24/2019 Speech Therapy Frequency (ACUTE ONLY) min 2x/week Treatment Duration 2 weeks      [facility] IP ORAL PHASE 04/24/2019 Oral Phase Impaired Oral - Pudding Teaspoon -- Oral - Pudding Cup -- Oral - Honey Teaspoon -- Oral - Honey Cup Right anterior bolus loss;Weak lingual manipulation;Reduced posterior propulsion;Lingual/palatal residue;Decreased bolus cohesion;Premature spillage Oral - Nectar Teaspoon -- Oral - Nectar Cup Right anterior bolus loss;Weak lingual manipulation;Reduced posterior propulsion;Lingual/palatal residue;Decreased bolus cohesion;Premature spillage Oral - Nectar Straw Right anterior bolus loss;Weak lingual manipulation;Reduced posterior propulsion;Lingual/palatal residue;Decreased bolus cohesion;Premature spillage Oral - Thin Teaspoon -- Oral -  Thin Cup Right anterior bolus loss;Weak lingual manipulation;Reduced posterior propulsion;Lingual/palatal residue;Decreased bolus cohesion;Premature spillage Oral - Thin Straw Right anterior bolus loss;Weak lingual manipulation;Reduced posterior propulsion;Lingual/palatal residue;Decreased bolus cohesion;Premature spillage Oral - Puree Right anterior bolus loss;Weak lingual manipulation;Reduced  posterior propulsion;Lingual/palatal residue;Decreased bolus cohesion;Premature spillage Oral - Mech Soft Right anterior bolus loss;Weak lingual manipulation;Reduced posterior propulsion;Lingual/palatal residue;Decreased bolus cohesion;Premature spillage Oral - Regular -- Oral - Multi-Consistency -- Oral - Pill -- Oral Phase - Comment --  [facility] IP PHARYNGEAL PHASE 04/24/2019 Pharyngeal Phase Impaired Pharyngeal- Pudding Teaspoon -- Pharyngeal -- Pharyngeal- Pudding Cup -- Pharyngeal -- Pharyngeal- Honey Teaspoon -- Pharyngeal -- Pharyngeal- Honey Cup Reduced tongue base retraction;Reduced epiglottic inversion;Pharyngeal residue - valleculae Pharyngeal -- Pharyngeal- Nectar Teaspoon -- Pharyngeal -- Pharyngeal- Nectar Cup Reduced tongue base retraction;Reduced epiglottic inversion;Pharyngeal residue - valleculae;Penetration/Aspiration during swallow Pharyngeal Material enters airway, passes BELOW cords without attempt by patient to eject out (silent aspiration) Pharyngeal- Nectar Straw Reduced tongue base retraction;Reduced epiglottic inversion;Pharyngeal residue - valleculae;Penetration/Aspiration during swallow Pharyngeal Material enters airway, passes BELOW cords without attempt by patient to eject out (silent aspiration) Pharyngeal- Thin Teaspoon -- Pharyngeal -- Pharyngeal- Thin Cup Reduced tongue base retraction;Reduced epiglottic inversion;Penetration/Aspiration during swallow Pharyngeal Material enters airway, passes BELOW cords without attempt by patient to eject out (silent aspiration) Pharyngeal- Thin Straw Reduced tongue base retraction;Reduced epiglottic inversion;Penetration/Aspiration during swallow Pharyngeal Material enters airway, passes BELOW cords without attempt by patient to eject out (silent aspiration) Pharyngeal- Puree Reduced tongue base retraction;Reduced epiglottic inversion;Pharyngeal residue - valleculae Pharyngeal -- Pharyngeal- Mechanical Soft Reduced tongue base retraction;Reduced  epiglottic inversion;Pharyngeal residue - valleculae Pharyngeal -- Pharyngeal- Regular -- Pharyngeal -- Pharyngeal- Multi-consistency -- Pharyngeal -- Pharyngeal- Pill -- Pharyngeal -- Pharyngeal Comment --  [facility] IP CERVICAL ESOPHAGEAL PHASE 04/24/2019 Cervical Esophageal Phase WFL Pudding Teaspoon -- Pudding Cup -- Honey Teaspoon -- Honey Cup -- Nectar Teaspoon -- Nectar Cup -- Nectar Straw -- Thin Teaspoon -- Thin Cup -- Thin Straw -- Puree -- Mechanical Soft -- Regular -- Multi-consistency -- Pill -- Cervical Esophageal Comment -- Venita Sheffield Nix 04/24/2019, 1:27 PM  Pollyann Glen, M.A. [facility] Pager [phone] Office [phone]               Subjective:   Patient was seen and examined 04/28/2019, 11:21 AM Patient stable today. No acute distress.  No issues overnight Stable for discharge.  Discharge Exam:    Vitals:   04/27/19 2000 04/27/19 2121 04/28/19 0300 04/28/19 0340  BP:  (!) 153/59  (!) 125/58  Pulse: (!) 56 (!) 50  (!) 55  Resp: (!) 30 20  17   Temp:  98.2 F (36.8 C)  99 F (37.2 C)  TempSrc:  Oral  Oral  SpO2: 96% 95%  95%  Weight:   89.9 kg   Height:        General: Pt lying comfortably in bed & appears in no obvious distress. Cardiovascular: S1 & S2 heard, RRR, S1/S2 +. No murmurs, rubs, gallops or clicks. No JVD or pedal edema. Respiratory: Clear to auscultation without wheezing, rhonchi or crackles. No increased work of breathing. Abdominal:  Non-distended, non-tender & soft. No organomegaly or masses appreciated. Normal bowel sounds heard. CNS: Alert and oriented. No focal deficits. Extremities: no edema, no cyanosis    The results of significant diagnostics from this hospitalization (including imaging, microbiology, ancillary and laboratory) are listed below for reference.      Microbiology:   Recent Results (from the past 240 hour(s))  SARS CORONAVIRUS 2 (TAT 6-24  HRS) Nasopharyngeal Nasopharyngeal Swab     Status: None    Collection Time: 04/23/19  5:48 PM   Specimen: Nasopharyngeal Swab  Result Value Ref Range Status   SARS Coronavirus 2 NEGATIVE NEGATIVE Final    Comment: (NOTE) SARS-CoV-2 target nucleic acids are NOT DETECTED. The SARS-CoV-2 RNA is generally detectable in upper and lower respiratory specimens during the acute phase of infection. Negative results do not preclude SARS-CoV-2 infection, do not rule out co-infections with other pathogens, and should not be used as the sole basis for treatment or other patient management decisions. Negative results must be combined with clinical observations, patient history, and epidemiological information. The expected result is Negative. Fact Sheet for Patients: SugarRoll.be Fact Sheet for Healthcare Providers: https://www.woods-mathews.com/ This test is not yet approved or cleared by the [place] FDA and  has been authorized for detection and/or diagnosis of SARS-CoV-2 by FDA under an Emergency Use Authorization (EUA). This EUA will remain  in effect (meaning this test can be used) for the duration of the COVID-19 declaration under Section 56 4(b)(1) of the Act, 21 U.S.C. section 360bbb-3(b)(1), unless the authorization is terminated or revoked sooner. Performed at [facility], [place] [address] [place], [place] [address]   SARS CORONAVIRUS 2 (TAT 6-24 HRS) Nasopharyngeal Nasopharyngeal Swab     Status: None   Collection Time: 04/27/19  4:55 PM   Specimen: Nasopharyngeal Swab  Result Value Ref Range Status   SARS Coronavirus 2 NEGATIVE NEGATIVE Final    Comment: (NOTE) SARS-CoV-2 target nucleic acids are NOT DETECTED. The SARS-CoV-2 RNA is generally detectable in upper and lower respiratory specimens during the acute phase of infection. Negative results do not preclude SARS-CoV-2 infection, do not rule out co-infections with other pathogens, and should not be used as the sole basis for  treatment or other patient management decisions. Negative results must be combined with clinical observations, patient history, and epidemiological information. The expected result is Negative. Fact Sheet for Patients: SugarRoll.be Fact Sheet for Healthcare Providers: https://www.woods-mathews.com/ This test is not yet approved or cleared by the [place] FDA and  has been authorized for detection and/or diagnosis of SARS-CoV-2 by FDA under an Emergency Use Authorization (EUA). This EUA will remain  in effect (meaning this test can be used) for the duration of the COVID-19 declaration under Section 56 4(b)(1) of the Act, 21 U.S.C. section 360bbb-3(b)(1), unless the authorization is terminated or revoked sooner. Performed at [facility], [place] [address] [place], [place] [address]      Labs:   CBC: Recent Labs  Lab 04/23/19 0915 04/24/19 0630 04/27/19 0407 04/28/19 0457  WBC 10.5 7.1 6.8 5.8  NEUTROABS 9.2*  --   --   --   HGB 12.3* 10.6* 9.4* 10.0*  HCT 38.2* 32.8* 30.0* 31.6*  MCV 90.1 88.6 92.0 90.5  PLT 232 236 230 0000000   Basic Metabolic Panel: Recent Labs  Lab 04/23/19 0915 04/24/19 0630 04/27/19 0407 04/28/19 0457  NA 141 142 142 141  K 3.7 3.5 4.2 4.1  CL 106 108 109 108  CO2 21* 23 27 25   GLUCOSE 108* 105* 101* 102*  BUN 35* 31* 27* 31*  CREATININE 1.53* 1.57* 1.24 1.34*  CALCIUM 9.1 8.6* 8.5* 8.6*  MG  --   --  2.2 2.4   Liver Function Tests: Recent Labs  Lab 04/23/19 0915 04/27/19 0407 04/28/19 0457  AST 22 21 29   ALT 20 22 29   ALKPHOS 42 34* 37*  BILITOT 1.9* 1.1 0.9  PROT 6.8 5.2* 5.3*  ALBUMIN 3.2* 2.1* 2.2*   BNP (last 3 results) Recent Labs    04/23/19 0915  BNP 76.5   Cardiac Enzymes: Recent Labs  Lab 04/23/19 0915  CKTOTAL 237   CBG: Recent Labs  Lab 04/26/19 1609 04/26/19 2035 04/27/19 0733 04/27/19 1103 04/27/19 1632  GLUCAP 107* 126* 94 112* 112*   Urinalysis     Component Value Date/Time   COLORURINE YELLOW 04/23/2019 1854   APPEARANCEUR HAZY (A) 04/23/2019 1854   LABSPEC 1.043 (H) 04/23/2019 1854   PHURINE 5.0 04/23/2019 1854   GLUCOSEU NEGATIVE 04/23/2019 1854   HGBUR NEGATIVE 04/23/2019 [place] 04/23/2019 [place] 04/23/2019 [place] 04/23/2019 1854   UROBILINOGEN 1.0 12/19/2013 1603   NITRITE NEGATIVE 04/23/2019 1854   LEUKOCYTESUR LARGE (A) 04/23/2019 1854    Time coordinating discharge: Over 30 minutes  SIGNED: Deatra James, MD, FACP, FHM. [facility],  Pager [phone]  If 7PM-7AM, please contact night-coverage [email or web address] Ota [facility] 04/28/2019, 11:21 AM

## 2019-05-26 ENCOUNTER — Other Ambulatory Visit: Payer: Self-pay | Admitting: Internal Medicine

## 2019-05-26 DIAGNOSIS — Z95810 Presence of automatic (implantable) cardiac defibrillator: Secondary | ICD-10-CM

## 2019-06-09 DEATH — deceased
